# Patient Record
Sex: Female | Born: 1935
Health system: Southern US, Community
[De-identification: ages and names within clinical notes are randomized; demographics above are authoritative.]

## PROBLEM LIST (undated history)

## (undated) ENCOUNTER — Emergency Department: Admission: EM | Payer: Medicare Other | Source: Home / Self Care

## (undated) DIAGNOSIS — E2839 Other primary ovarian failure: Secondary | ICD-10-CM

## (undated) DIAGNOSIS — D649 Anemia, unspecified: Secondary | ICD-10-CM

## (undated) DIAGNOSIS — I10 Essential (primary) hypertension: Secondary | ICD-10-CM

## (undated) DIAGNOSIS — R55 Syncope and collapse: Secondary | ICD-10-CM

## (undated) DIAGNOSIS — I83893 Varicose veins of bilateral lower extremities with other complications: Secondary | ICD-10-CM

## (undated) DIAGNOSIS — E538 Deficiency of other specified B group vitamins: Secondary | ICD-10-CM

## (undated) DIAGNOSIS — M545 Low back pain, unspecified: Secondary | ICD-10-CM

## (undated) DIAGNOSIS — I219 Acute myocardial infarction, unspecified: Secondary | ICD-10-CM

## (undated) DIAGNOSIS — T4145XA Adverse effect of unspecified anesthetic, initial encounter: Secondary | ICD-10-CM

## (undated) DIAGNOSIS — M1990 Unspecified osteoarthritis, unspecified site: Secondary | ICD-10-CM

## (undated) DIAGNOSIS — E119 Type 2 diabetes mellitus without complications: Secondary | ICD-10-CM

## (undated) DIAGNOSIS — J301 Allergic rhinitis due to pollen: Secondary | ICD-10-CM

## (undated) DIAGNOSIS — I519 Heart disease, unspecified: Secondary | ICD-10-CM

## (undated) DIAGNOSIS — R319 Hematuria, unspecified: Secondary | ICD-10-CM

## (undated) DIAGNOSIS — R519 Headache, unspecified: Secondary | ICD-10-CM

## (undated) DIAGNOSIS — D563 Thalassemia minor: Secondary | ICD-10-CM

## (undated) DIAGNOSIS — I808 Phlebitis and thrombophlebitis of other sites: Secondary | ICD-10-CM

## (undated) DIAGNOSIS — C9590 Leukemia, unspecified not having achieved remission: Secondary | ICD-10-CM

## (undated) DIAGNOSIS — D518 Other vitamin B12 deficiency anemias: Secondary | ICD-10-CM

## (undated) DIAGNOSIS — G4710 Hypersomnia, unspecified: Secondary | ICD-10-CM

## (undated) DIAGNOSIS — R262 Difficulty in walking, not elsewhere classified: Secondary | ICD-10-CM

## (undated) DIAGNOSIS — I2510 Atherosclerotic heart disease of native coronary artery without angina pectoris: Secondary | ICD-10-CM

## (undated) DIAGNOSIS — N390 Urinary tract infection, site not specified: Secondary | ICD-10-CM

## (undated) DIAGNOSIS — H539 Unspecified visual disturbance: Secondary | ICD-10-CM

## (undated) DIAGNOSIS — Z8489 Family history of other specified conditions: Secondary | ICD-10-CM

## (undated) DIAGNOSIS — J029 Acute pharyngitis, unspecified: Secondary | ICD-10-CM

## (undated) DIAGNOSIS — M810 Age-related osteoporosis without current pathological fracture: Secondary | ICD-10-CM

## (undated) DIAGNOSIS — G4700 Insomnia, unspecified: Secondary | ICD-10-CM

## (undated) DIAGNOSIS — F32A Depression, unspecified: Secondary | ICD-10-CM

## (undated) DIAGNOSIS — E039 Hypothyroidism, unspecified: Secondary | ICD-10-CM

## (undated) DIAGNOSIS — N189 Chronic kidney disease, unspecified: Secondary | ICD-10-CM

## (undated) DIAGNOSIS — R300 Dysuria: Secondary | ICD-10-CM

## (undated) DIAGNOSIS — L03119 Cellulitis of unspecified part of limb: Secondary | ICD-10-CM

## (undated) DIAGNOSIS — M79609 Pain in unspecified limb: Secondary | ICD-10-CM

## (undated) DIAGNOSIS — K5909 Other constipation: Secondary | ICD-10-CM

## (undated) DIAGNOSIS — N649 Disorder of breast, unspecified: Secondary | ICD-10-CM

## (undated) DIAGNOSIS — S0990XA Unspecified injury of head, initial encounter: Secondary | ICD-10-CM

## (undated) DIAGNOSIS — D125 Benign neoplasm of sigmoid colon: Secondary | ICD-10-CM

## (undated) DIAGNOSIS — T8859XA Other complications of anesthesia, initial encounter: Secondary | ICD-10-CM

## (undated) DIAGNOSIS — E876 Hypokalemia: Secondary | ICD-10-CM

## (undated) DIAGNOSIS — M542 Cervicalgia: Secondary | ICD-10-CM

## (undated) DIAGNOSIS — J219 Acute bronchiolitis, unspecified: Secondary | ICD-10-CM

## (undated) DIAGNOSIS — K219 Gastro-esophageal reflux disease without esophagitis: Secondary | ICD-10-CM

## (undated) DIAGNOSIS — J45909 Unspecified asthma, uncomplicated: Secondary | ICD-10-CM

## (undated) DIAGNOSIS — F329 Major depressive disorder, single episode, unspecified: Secondary | ICD-10-CM

## (undated) DIAGNOSIS — R42 Dizziness and giddiness: Secondary | ICD-10-CM

## (undated) DIAGNOSIS — H6690 Otitis media, unspecified, unspecified ear: Secondary | ICD-10-CM

## (undated) DIAGNOSIS — I639 Cerebral infarction, unspecified: Secondary | ICD-10-CM

## (undated) DIAGNOSIS — C9210 Chronic myeloid leukemia, BCR/ABL-positive, not having achieved remission: Secondary | ICD-10-CM

## (undated) DIAGNOSIS — F411 Generalized anxiety disorder: Secondary | ICD-10-CM

## (undated) DIAGNOSIS — G479 Sleep disorder, unspecified: Secondary | ICD-10-CM

## (undated) DIAGNOSIS — Z9289 Personal history of other medical treatment: Secondary | ICD-10-CM

## (undated) DIAGNOSIS — M069 Rheumatoid arthritis, unspecified: Secondary | ICD-10-CM

## (undated) DIAGNOSIS — D569 Thalassemia, unspecified: Secondary | ICD-10-CM

## (undated) DIAGNOSIS — R51 Headache: Secondary | ICD-10-CM

## (undated) DIAGNOSIS — I679 Cerebrovascular disease, unspecified: Secondary | ICD-10-CM

## (undated) DIAGNOSIS — I619 Nontraumatic intracerebral hemorrhage, unspecified: Secondary | ICD-10-CM

## (undated) DIAGNOSIS — E782 Mixed hyperlipidemia: Secondary | ICD-10-CM

## (undated) DIAGNOSIS — M47812 Spondylosis without myelopathy or radiculopathy, cervical region: Secondary | ICD-10-CM

## (undated) DIAGNOSIS — F410 Panic disorder [episodic paroxysmal anxiety] without agoraphobia: Secondary | ICD-10-CM

## (undated) DIAGNOSIS — F5102 Adjustment insomnia: Secondary | ICD-10-CM

## (undated) DIAGNOSIS — L02419 Cutaneous abscess of limb, unspecified: Secondary | ICD-10-CM

## (undated) DIAGNOSIS — I739 Peripheral vascular disease, unspecified: Secondary | ICD-10-CM

## (undated) DIAGNOSIS — G459 Transient cerebral ischemic attack, unspecified: Secondary | ICD-10-CM

## (undated) DIAGNOSIS — G8929 Other chronic pain: Secondary | ICD-10-CM

## (undated) DIAGNOSIS — E049 Nontoxic goiter, unspecified: Secondary | ICD-10-CM

## (undated) DIAGNOSIS — C91Z0 Other lymphoid leukemia not having achieved remission: Secondary | ICD-10-CM

## (undated) DIAGNOSIS — I6529 Occlusion and stenosis of unspecified carotid artery: Secondary | ICD-10-CM

## (undated) HISTORY — PX: CORONARY ANGIOPLASTY WITH STENT PLACEMENT: SHX49

## (undated) HISTORY — DX: Dizziness and giddiness: R42

## (undated) HISTORY — DX: Adjustment insomnia: F51.02

## (undated) HISTORY — DX: Unspecified injury of head, initial encounter: S09.90XA

## (undated) HISTORY — DX: Insomnia, unspecified: G47.00

## (undated) HISTORY — DX: Nontoxic goiter, unspecified: E04.9

## (undated) HISTORY — DX: Chronic kidney disease, unspecified: N18.9

## (undated) HISTORY — DX: Generalized anxiety disorder: F41.1

## (undated) HISTORY — DX: Pain in unspecified limb: M79.609

## (undated) HISTORY — DX: Thalassemia minor: D56.3

## (undated) HISTORY — DX: Cervicalgia: M54.2

## (undated) HISTORY — DX: Depression, unspecified: F32.A

## (undated) HISTORY — DX: Transient cerebral ischemic attack, unspecified: G45.9

## (undated) HISTORY — DX: Disorder of breast, unspecified: N64.9

## (undated) HISTORY — DX: Major depressive disorder, single episode, unspecified: F32.9

## (undated) HISTORY — DX: Other primary ovarian failure: E28.39

## (undated) HISTORY — DX: Cellulitis of unspecified part of limb: L02.419

## (undated) HISTORY — DX: Atherosclerotic heart disease of native coronary artery without angina pectoris: I25.10

## (undated) HISTORY — DX: Unspecified osteoarthritis, unspecified site: M19.90

## (undated) HISTORY — DX: Rheumatoid arthritis, unspecified: M06.9

## (undated) HISTORY — DX: Anemia, unspecified: D64.9

## (undated) HISTORY — DX: Dysuria: R30.0

## (undated) HISTORY — DX: Cerebral infarction, unspecified: I63.9

## (undated) HISTORY — DX: Hypokalemia: E87.6

## (undated) HISTORY — PX: CATARACT EXTRACTION W/ INTRAOCULAR LENS  IMPLANT, BILATERAL: SHX1307

## (undated) HISTORY — PX: BACK SURGERY: SHX140

## (undated) HISTORY — DX: Cellulitis of unspecified part of limb: L03.119

## (undated) HISTORY — PX: ROTATOR CUFF REPAIR: SHX139

## (undated) HISTORY — DX: Low back pain: M54.5

## (undated) HISTORY — DX: Otitis media, unspecified, unspecified ear: H66.90

## (undated) HISTORY — DX: Low back pain, unspecified: M54.50

## (undated) HISTORY — DX: Deficiency of other specified B group vitamins: E53.8

## (undated) HISTORY — DX: Sleep disorder, unspecified: G47.9

## (undated) HISTORY — DX: Heart disease, unspecified: I51.9

## (undated) HISTORY — DX: Essential (primary) hypertension: I10

## (undated) HISTORY — DX: Syncope and collapse: R55

## (undated) HISTORY — DX: Panic disorder (episodic paroxysmal anxiety): F41.0

## (undated) HISTORY — DX: Acute pharyngitis, unspecified: J02.9

## (undated) HISTORY — DX: Gastro-esophageal reflux disease without esophagitis: K21.9

## (undated) HISTORY — DX: Other vitamin B12 deficiency anemias: D51.8

## (undated) HISTORY — PX: TOTAL KNEE ARTHROPLASTY: SHX125

## (undated) HISTORY — DX: Mixed hyperlipidemia: E78.2

## (undated) HISTORY — DX: Other lymphoid leukemia not having achieved remission: C91.Z0

## (undated) HISTORY — DX: Other constipation: K59.09

## (undated) HISTORY — DX: Nontraumatic intracerebral hemorrhage, unspecified: I61.9

## (undated) HISTORY — DX: Spondylosis without myelopathy or radiculopathy, cervical region: M47.812

## (undated) HISTORY — DX: Hypersomnia, unspecified: G47.10

## (undated) HISTORY — PX: CAROTID STENT INSERTION: SHX5766

## (undated) HISTORY — PX: FRACTURE SURGERY: SHX138

## (undated) HISTORY — DX: Occlusion and stenosis of unspecified carotid artery: I65.29

## (undated) HISTORY — DX: Varicose veins of bilateral lower extremities with other complications: I83.893

## (undated) HISTORY — DX: Acute bronchiolitis, unspecified: J21.9

## (undated) HISTORY — DX: Difficulty in walking, not elsewhere classified: R26.2

## (undated) HISTORY — DX: Allergic rhinitis due to pollen: J30.1

## (undated) HISTORY — DX: Cerebrovascular disease, unspecified: I67.9

## (undated) HISTORY — DX: Hypothyroidism, unspecified: E03.9

## (undated) HISTORY — DX: Unspecified asthma, uncomplicated: J45.909

## (undated) HISTORY — PX: JOINT REPLACEMENT: SHX530

## (undated) HISTORY — DX: Urinary tract infection, site not specified: N39.0

## (undated) HISTORY — PX: CAROTID ENDARTERECTOMY: SUR193

## (undated) HISTORY — DX: Hematuria, unspecified: R31.9

## (undated) HISTORY — DX: Phlebitis and thrombophlebitis of other sites: I80.8

## (undated) HISTORY — DX: Unspecified visual disturbance: H53.9

## (undated) HISTORY — DX: Age-related osteoporosis without current pathological fracture: M81.0

## (undated) HISTORY — DX: Benign neoplasm of sigmoid colon: D12.5

## (undated) HISTORY — DX: Peripheral vascular disease, unspecified: I73.9

---

## 1976-08-24 HISTORY — PX: TOTAL ABDOMINAL HYSTERECTOMY: SHX209

## 1978-08-24 HISTORY — PX: CHOLECYSTECTOMY OPEN: SUR202

## 1996-08-24 DIAGNOSIS — K635 Polyp of colon: Secondary | ICD-10-CM

## 1996-08-24 HISTORY — DX: Polyp of colon: K63.5

## 1999-12-29 ENCOUNTER — Ambulatory Visit (HOSPITAL_COMMUNITY): Admission: RE | Admit: 1999-12-29 | Discharge: 1999-12-29 | Payer: Self-pay | Admitting: Gastroenterology

## 2000-06-15 ENCOUNTER — Ambulatory Visit (HOSPITAL_COMMUNITY): Admission: RE | Admit: 2000-06-15 | Discharge: 2000-06-15 | Payer: Self-pay | Admitting: Gastroenterology

## 2001-12-22 DIAGNOSIS — C91Z0 Other lymphoid leukemia not having achieved remission: Secondary | ICD-10-CM

## 2001-12-22 HISTORY — DX: Other lymphoid leukemia not having achieved remission: C91.Z0

## 2003-02-16 ENCOUNTER — Encounter: Admission: RE | Admit: 2003-02-16 | Discharge: 2003-02-16 | Payer: Self-pay | Admitting: *Deleted

## 2003-02-16 ENCOUNTER — Encounter: Payer: Self-pay | Admitting: *Deleted

## 2003-03-07 ENCOUNTER — Encounter: Payer: Self-pay | Admitting: Gastroenterology

## 2003-03-07 ENCOUNTER — Ambulatory Visit (HOSPITAL_COMMUNITY): Admission: RE | Admit: 2003-03-07 | Discharge: 2003-03-07 | Payer: Self-pay | Admitting: Gastroenterology

## 2003-04-20 ENCOUNTER — Ambulatory Visit (HOSPITAL_COMMUNITY): Admission: RE | Admit: 2003-04-20 | Discharge: 2003-04-20 | Payer: Self-pay | Admitting: Gastroenterology

## 2003-08-25 HISTORY — PX: OTHER SURGICAL HISTORY: SHX169

## 2004-07-29 ENCOUNTER — Ambulatory Visit: Payer: Self-pay | Admitting: Internal Medicine

## 2005-06-22 ENCOUNTER — Ambulatory Visit (HOSPITAL_COMMUNITY): Admission: RE | Admit: 2005-06-22 | Discharge: 2005-06-22 | Payer: Self-pay | Admitting: Gastroenterology

## 2006-04-12 ENCOUNTER — Inpatient Hospital Stay: Payer: Self-pay | Admitting: Internal Medicine

## 2006-04-27 ENCOUNTER — Ambulatory Visit: Payer: Self-pay | Admitting: Internal Medicine

## 2006-05-21 ENCOUNTER — Ambulatory Visit: Payer: Self-pay | Admitting: Internal Medicine

## 2006-05-21 ENCOUNTER — Ambulatory Visit: Payer: Self-pay

## 2006-05-24 ENCOUNTER — Encounter: Payer: Self-pay | Admitting: Internal Medicine

## 2006-05-28 ENCOUNTER — Ambulatory Visit: Payer: Self-pay | Admitting: Internal Medicine

## 2006-06-30 ENCOUNTER — Encounter: Payer: Self-pay | Admitting: Internal Medicine

## 2006-07-10 ENCOUNTER — Emergency Department: Payer: Self-pay

## 2006-07-24 ENCOUNTER — Encounter: Payer: Self-pay | Admitting: Internal Medicine

## 2007-02-24 ENCOUNTER — Ambulatory Visit: Payer: Self-pay | Admitting: Internal Medicine

## 2007-03-15 ENCOUNTER — Ambulatory Visit: Payer: Self-pay | Admitting: Internal Medicine

## 2007-03-29 ENCOUNTER — Encounter: Payer: Self-pay | Admitting: Orthopaedic Surgery

## 2007-04-05 ENCOUNTER — Ambulatory Visit: Payer: Self-pay | Admitting: Vascular Surgery

## 2007-04-13 ENCOUNTER — Ambulatory Visit: Payer: Self-pay | Admitting: Family Medicine

## 2007-04-25 ENCOUNTER — Encounter: Payer: Self-pay | Admitting: Orthopaedic Surgery

## 2007-05-25 ENCOUNTER — Encounter: Payer: Self-pay | Admitting: Orthopaedic Surgery

## 2007-06-02 ENCOUNTER — Ambulatory Visit: Payer: Self-pay | Admitting: Unknown Physician Specialty

## 2007-06-25 ENCOUNTER — Encounter: Payer: Self-pay | Admitting: Orthopaedic Surgery

## 2007-07-11 ENCOUNTER — Ambulatory Visit: Payer: Self-pay | Admitting: Unknown Physician Specialty

## 2007-07-25 ENCOUNTER — Encounter: Payer: Self-pay | Admitting: Orthopaedic Surgery

## 2007-08-25 ENCOUNTER — Encounter: Payer: Self-pay | Admitting: Orthopaedic Surgery

## 2007-08-25 DIAGNOSIS — I2510 Atherosclerotic heart disease of native coronary artery without angina pectoris: Secondary | ICD-10-CM

## 2007-08-25 HISTORY — DX: Atherosclerotic heart disease of native coronary artery without angina pectoris: I25.10

## 2007-11-09 ENCOUNTER — Inpatient Hospital Stay: Payer: Self-pay | Admitting: Internal Medicine

## 2007-11-09 ENCOUNTER — Other Ambulatory Visit: Payer: Self-pay

## 2008-01-13 ENCOUNTER — Ambulatory Visit: Payer: Self-pay | Admitting: Internal Medicine

## 2008-02-13 ENCOUNTER — Encounter: Payer: Self-pay | Admitting: Internal Medicine

## 2008-04-23 ENCOUNTER — Encounter: Payer: Self-pay | Admitting: *Deleted

## 2008-04-25 ENCOUNTER — Encounter: Payer: Self-pay | Admitting: *Deleted

## 2008-05-01 ENCOUNTER — Emergency Department: Payer: Self-pay | Admitting: Emergency Medicine

## 2008-05-01 ENCOUNTER — Other Ambulatory Visit: Payer: Self-pay

## 2008-05-04 ENCOUNTER — Other Ambulatory Visit: Payer: Self-pay

## 2008-05-04 ENCOUNTER — Inpatient Hospital Stay: Payer: Self-pay | Admitting: Psychiatry

## 2008-05-05 ENCOUNTER — Other Ambulatory Visit: Payer: Self-pay

## 2008-05-28 ENCOUNTER — Encounter: Payer: Self-pay | Admitting: *Deleted

## 2008-06-24 ENCOUNTER — Encounter: Payer: Self-pay | Admitting: *Deleted

## 2008-07-30 ENCOUNTER — Encounter: Payer: Self-pay | Admitting: *Deleted

## 2008-08-06 ENCOUNTER — Inpatient Hospital Stay: Payer: Self-pay | Admitting: Internal Medicine

## 2008-08-24 HISTORY — PX: COLONOSCOPY: SHX174

## 2008-10-01 ENCOUNTER — Ambulatory Visit: Payer: Self-pay | Admitting: Internal Medicine

## 2008-10-03 ENCOUNTER — Encounter: Payer: Self-pay | Admitting: Internal Medicine

## 2008-10-22 ENCOUNTER — Encounter: Payer: Self-pay | Admitting: Internal Medicine

## 2008-11-16 ENCOUNTER — Ambulatory Visit: Payer: Self-pay | Admitting: Internal Medicine

## 2008-11-22 ENCOUNTER — Encounter: Payer: Self-pay | Admitting: Internal Medicine

## 2009-01-30 ENCOUNTER — Ambulatory Visit: Payer: Self-pay

## 2009-06-14 HISTORY — PX: PERCUTANEOUS PLACEMENT INTRAVASCULAR STENT CERVICAL CAROTID ARTERY: SUR1019

## 2009-07-30 ENCOUNTER — Inpatient Hospital Stay: Payer: Self-pay | Admitting: Internal Medicine

## 2009-08-05 ENCOUNTER — Ambulatory Visit: Payer: Self-pay | Admitting: Vascular Surgery

## 2009-08-14 ENCOUNTER — Inpatient Hospital Stay: Payer: Self-pay | Admitting: Vascular Surgery

## 2009-09-02 ENCOUNTER — Ambulatory Visit: Payer: Self-pay | Admitting: General Practice

## 2009-09-06 ENCOUNTER — Ambulatory Visit: Payer: Self-pay | Admitting: Vascular Surgery

## 2009-11-05 ENCOUNTER — Ambulatory Visit: Payer: Self-pay | Admitting: General Practice

## 2009-11-18 ENCOUNTER — Inpatient Hospital Stay: Payer: Self-pay | Admitting: General Practice

## 2009-11-24 ENCOUNTER — Encounter: Payer: Self-pay | Admitting: Internal Medicine

## 2009-12-13 ENCOUNTER — Emergency Department: Payer: Self-pay | Admitting: Unknown Physician Specialty

## 2009-12-17 ENCOUNTER — Encounter: Payer: Self-pay | Admitting: General Practice

## 2009-12-22 ENCOUNTER — Encounter: Payer: Self-pay | Admitting: General Practice

## 2010-01-10 ENCOUNTER — Emergency Department: Payer: Self-pay

## 2010-01-22 ENCOUNTER — Encounter: Payer: Self-pay | Admitting: General Practice

## 2010-02-21 ENCOUNTER — Encounter: Payer: Self-pay | Admitting: General Practice

## 2010-11-25 ENCOUNTER — Ambulatory Visit: Payer: Self-pay | Admitting: Internal Medicine

## 2011-07-10 ENCOUNTER — Ambulatory Visit: Payer: Self-pay | Admitting: Internal Medicine

## 2011-07-15 ENCOUNTER — Ambulatory Visit: Payer: Self-pay | Admitting: Internal Medicine

## 2011-07-22 ENCOUNTER — Ambulatory Visit: Payer: Self-pay

## 2011-08-19 ENCOUNTER — Ambulatory Visit: Payer: Self-pay | Admitting: Physician Assistant

## 2011-08-26 DIAGNOSIS — M545 Low back pain, unspecified: Secondary | ICD-10-CM | POA: Diagnosis not present

## 2011-08-26 DIAGNOSIS — M1990 Unspecified osteoarthritis, unspecified site: Secondary | ICD-10-CM | POA: Diagnosis not present

## 2011-08-26 DIAGNOSIS — M25559 Pain in unspecified hip: Secondary | ICD-10-CM | POA: Diagnosis not present

## 2011-08-26 DIAGNOSIS — M76899 Other specified enthesopathies of unspecified lower limb, excluding foot: Secondary | ICD-10-CM | POA: Diagnosis not present

## 2011-09-02 ENCOUNTER — Encounter: Payer: Self-pay | Admitting: Physician Assistant

## 2011-09-02 DIAGNOSIS — M79609 Pain in unspecified limb: Secondary | ICD-10-CM | POA: Diagnosis not present

## 2011-09-02 DIAGNOSIS — M6281 Muscle weakness (generalized): Secondary | ICD-10-CM | POA: Diagnosis not present

## 2011-09-02 DIAGNOSIS — R262 Difficulty in walking, not elsewhere classified: Secondary | ICD-10-CM | POA: Diagnosis not present

## 2011-09-02 DIAGNOSIS — IMO0001 Reserved for inherently not codable concepts without codable children: Secondary | ICD-10-CM | POA: Diagnosis not present

## 2011-09-03 DIAGNOSIS — D630 Anemia in neoplastic disease: Secondary | ICD-10-CM | POA: Diagnosis not present

## 2011-09-03 DIAGNOSIS — D649 Anemia, unspecified: Secondary | ICD-10-CM | POA: Diagnosis not present

## 2011-09-03 DIAGNOSIS — C9110 Chronic lymphocytic leukemia of B-cell type not having achieved remission: Secondary | ICD-10-CM | POA: Diagnosis not present

## 2011-09-03 DIAGNOSIS — C9111 Chronic lymphocytic leukemia of B-cell type in remission: Secondary | ICD-10-CM | POA: Diagnosis not present

## 2011-09-17 DIAGNOSIS — D630 Anemia in neoplastic disease: Secondary | ICD-10-CM | POA: Diagnosis not present

## 2011-09-17 DIAGNOSIS — C9110 Chronic lymphocytic leukemia of B-cell type not having achieved remission: Secondary | ICD-10-CM | POA: Diagnosis not present

## 2011-09-25 ENCOUNTER — Encounter: Payer: Self-pay | Admitting: Physician Assistant

## 2011-10-08 DIAGNOSIS — D630 Anemia in neoplastic disease: Secondary | ICD-10-CM | POA: Diagnosis not present

## 2011-10-08 DIAGNOSIS — C9110 Chronic lymphocytic leukemia of B-cell type not having achieved remission: Secondary | ICD-10-CM | POA: Diagnosis not present

## 2011-10-08 DIAGNOSIS — C9111 Chronic lymphocytic leukemia of B-cell type in remission: Secondary | ICD-10-CM | POA: Diagnosis not present

## 2011-10-08 DIAGNOSIS — D649 Anemia, unspecified: Secondary | ICD-10-CM | POA: Diagnosis not present

## 2011-10-22 DIAGNOSIS — C9110 Chronic lymphocytic leukemia of B-cell type not having achieved remission: Secondary | ICD-10-CM | POA: Diagnosis not present

## 2011-10-22 DIAGNOSIS — D630 Anemia in neoplastic disease: Secondary | ICD-10-CM | POA: Diagnosis not present

## 2011-11-05 DIAGNOSIS — D630 Anemia in neoplastic disease: Secondary | ICD-10-CM | POA: Diagnosis not present

## 2011-11-05 DIAGNOSIS — D649 Anemia, unspecified: Secondary | ICD-10-CM | POA: Diagnosis not present

## 2011-11-05 DIAGNOSIS — C9110 Chronic lymphocytic leukemia of B-cell type not having achieved remission: Secondary | ICD-10-CM | POA: Diagnosis not present

## 2011-11-19 DIAGNOSIS — D630 Anemia in neoplastic disease: Secondary | ICD-10-CM | POA: Diagnosis not present

## 2011-11-19 DIAGNOSIS — C9110 Chronic lymphocytic leukemia of B-cell type not having achieved remission: Secondary | ICD-10-CM | POA: Diagnosis not present

## 2011-12-03 DIAGNOSIS — D630 Anemia in neoplastic disease: Secondary | ICD-10-CM | POA: Diagnosis not present

## 2011-12-03 DIAGNOSIS — C9110 Chronic lymphocytic leukemia of B-cell type not having achieved remission: Secondary | ICD-10-CM | POA: Diagnosis not present

## 2011-12-03 DIAGNOSIS — D649 Anemia, unspecified: Secondary | ICD-10-CM | POA: Diagnosis not present

## 2011-12-17 DIAGNOSIS — C9110 Chronic lymphocytic leukemia of B-cell type not having achieved remission: Secondary | ICD-10-CM | POA: Diagnosis not present

## 2011-12-17 DIAGNOSIS — D630 Anemia in neoplastic disease: Secondary | ICD-10-CM | POA: Diagnosis not present

## 2011-12-24 DIAGNOSIS — E119 Type 2 diabetes mellitus without complications: Secondary | ICD-10-CM | POA: Diagnosis not present

## 2011-12-24 DIAGNOSIS — E782 Mixed hyperlipidemia: Secondary | ICD-10-CM | POA: Diagnosis not present

## 2011-12-24 DIAGNOSIS — M79609 Pain in unspecified limb: Secondary | ICD-10-CM | POA: Diagnosis not present

## 2011-12-24 DIAGNOSIS — E039 Hypothyroidism, unspecified: Secondary | ICD-10-CM | POA: Diagnosis not present

## 2011-12-24 DIAGNOSIS — I10 Essential (primary) hypertension: Secondary | ICD-10-CM | POA: Diagnosis not present

## 2011-12-24 DIAGNOSIS — M159 Polyosteoarthritis, unspecified: Secondary | ICD-10-CM | POA: Diagnosis not present

## 2011-12-24 DIAGNOSIS — I739 Peripheral vascular disease, unspecified: Secondary | ICD-10-CM | POA: Diagnosis not present

## 2011-12-31 DIAGNOSIS — M1990 Unspecified osteoarthritis, unspecified site: Secondary | ICD-10-CM | POA: Diagnosis not present

## 2011-12-31 DIAGNOSIS — Z1231 Encounter for screening mammogram for malignant neoplasm of breast: Secondary | ICD-10-CM | POA: Diagnosis not present

## 2011-12-31 DIAGNOSIS — Z79899 Other long term (current) drug therapy: Secondary | ICD-10-CM | POA: Diagnosis not present

## 2011-12-31 DIAGNOSIS — Z856 Personal history of leukemia: Secondary | ICD-10-CM | POA: Diagnosis not present

## 2011-12-31 DIAGNOSIS — M810 Age-related osteoporosis without current pathological fracture: Secondary | ICD-10-CM | POA: Diagnosis not present

## 2011-12-31 DIAGNOSIS — D599 Acquired hemolytic anemia, unspecified: Secondary | ICD-10-CM | POA: Diagnosis not present

## 2011-12-31 DIAGNOSIS — I10 Essential (primary) hypertension: Secondary | ICD-10-CM | POA: Diagnosis not present

## 2011-12-31 DIAGNOSIS — E039 Hypothyroidism, unspecified: Secondary | ICD-10-CM | POA: Diagnosis not present

## 2012-01-14 DIAGNOSIS — D630 Anemia in neoplastic disease: Secondary | ICD-10-CM | POA: Diagnosis not present

## 2012-01-14 DIAGNOSIS — C9110 Chronic lymphocytic leukemia of B-cell type not having achieved remission: Secondary | ICD-10-CM | POA: Diagnosis not present

## 2012-01-28 DIAGNOSIS — D649 Anemia, unspecified: Secondary | ICD-10-CM | POA: Diagnosis not present

## 2012-01-28 DIAGNOSIS — D630 Anemia in neoplastic disease: Secondary | ICD-10-CM | POA: Diagnosis not present

## 2012-01-28 DIAGNOSIS — C9110 Chronic lymphocytic leukemia of B-cell type not having achieved remission: Secondary | ICD-10-CM | POA: Diagnosis not present

## 2012-02-11 DIAGNOSIS — I059 Rheumatic mitral valve disease, unspecified: Secondary | ICD-10-CM | POA: Diagnosis not present

## 2012-02-11 DIAGNOSIS — I2510 Atherosclerotic heart disease of native coronary artery without angina pectoris: Secondary | ICD-10-CM | POA: Diagnosis not present

## 2012-02-11 DIAGNOSIS — I6529 Occlusion and stenosis of unspecified carotid artery: Secondary | ICD-10-CM | POA: Diagnosis not present

## 2012-02-11 DIAGNOSIS — I119 Hypertensive heart disease without heart failure: Secondary | ICD-10-CM | POA: Diagnosis not present

## 2012-02-12 DIAGNOSIS — D630 Anemia in neoplastic disease: Secondary | ICD-10-CM | POA: Diagnosis not present

## 2012-02-12 DIAGNOSIS — C9110 Chronic lymphocytic leukemia of B-cell type not having achieved remission: Secondary | ICD-10-CM | POA: Diagnosis not present

## 2012-02-24 DIAGNOSIS — D631 Anemia in chronic kidney disease: Secondary | ICD-10-CM | POA: Diagnosis not present

## 2012-02-24 DIAGNOSIS — N039 Chronic nephritic syndrome with unspecified morphologic changes: Secondary | ICD-10-CM | POA: Diagnosis not present

## 2012-02-24 DIAGNOSIS — N189 Chronic kidney disease, unspecified: Secondary | ICD-10-CM | POA: Diagnosis not present

## 2012-03-10 DIAGNOSIS — D649 Anemia, unspecified: Secondary | ICD-10-CM | POA: Diagnosis not present

## 2012-03-10 DIAGNOSIS — C9111 Chronic lymphocytic leukemia of B-cell type in remission: Secondary | ICD-10-CM | POA: Diagnosis not present

## 2012-03-24 DIAGNOSIS — N189 Chronic kidney disease, unspecified: Secondary | ICD-10-CM | POA: Diagnosis not present

## 2012-03-24 DIAGNOSIS — D631 Anemia in chronic kidney disease: Secondary | ICD-10-CM | POA: Diagnosis not present

## 2012-04-07 DIAGNOSIS — D631 Anemia in chronic kidney disease: Secondary | ICD-10-CM | POA: Diagnosis not present

## 2012-04-07 DIAGNOSIS — N189 Chronic kidney disease, unspecified: Secondary | ICD-10-CM | POA: Diagnosis not present

## 2012-04-22 DIAGNOSIS — D649 Anemia, unspecified: Secondary | ICD-10-CM | POA: Diagnosis not present

## 2012-04-22 DIAGNOSIS — C9111 Chronic lymphocytic leukemia of B-cell type in remission: Secondary | ICD-10-CM | POA: Diagnosis not present

## 2012-05-06 DIAGNOSIS — H01009 Unspecified blepharitis unspecified eye, unspecified eyelid: Secondary | ICD-10-CM | POA: Diagnosis not present

## 2012-05-06 DIAGNOSIS — H35369 Drusen (degenerative) of macula, unspecified eye: Secondary | ICD-10-CM | POA: Insufficient documentation

## 2012-05-06 DIAGNOSIS — D589 Hereditary hemolytic anemia, unspecified: Secondary | ICD-10-CM | POA: Diagnosis not present

## 2012-05-20 DIAGNOSIS — C9111 Chronic lymphocytic leukemia of B-cell type in remission: Secondary | ICD-10-CM | POA: Diagnosis not present

## 2012-05-20 DIAGNOSIS — H547 Unspecified visual loss: Secondary | ICD-10-CM | POA: Insufficient documentation

## 2012-05-20 DIAGNOSIS — H01009 Unspecified blepharitis unspecified eye, unspecified eyelid: Secondary | ICD-10-CM | POA: Diagnosis not present

## 2012-05-20 DIAGNOSIS — D649 Anemia, unspecified: Secondary | ICD-10-CM | POA: Diagnosis not present

## 2012-05-20 DIAGNOSIS — H35369 Drusen (degenerative) of macula, unspecified eye: Secondary | ICD-10-CM | POA: Diagnosis not present

## 2012-05-30 DIAGNOSIS — M159 Polyosteoarthritis, unspecified: Secondary | ICD-10-CM | POA: Diagnosis not present

## 2012-05-30 DIAGNOSIS — D649 Anemia, unspecified: Secondary | ICD-10-CM | POA: Diagnosis not present

## 2012-05-30 DIAGNOSIS — E039 Hypothyroidism, unspecified: Secondary | ICD-10-CM | POA: Diagnosis not present

## 2012-05-30 DIAGNOSIS — E119 Type 2 diabetes mellitus without complications: Secondary | ICD-10-CM | POA: Diagnosis not present

## 2012-05-30 DIAGNOSIS — I10 Essential (primary) hypertension: Secondary | ICD-10-CM | POA: Diagnosis not present

## 2012-05-30 DIAGNOSIS — E782 Mixed hyperlipidemia: Secondary | ICD-10-CM | POA: Diagnosis not present

## 2012-06-10 DIAGNOSIS — H269 Unspecified cataract: Secondary | ICD-10-CM | POA: Diagnosis not present

## 2012-06-10 DIAGNOSIS — H547 Unspecified visual loss: Secondary | ICD-10-CM | POA: Diagnosis not present

## 2012-06-10 DIAGNOSIS — H26499 Other secondary cataract, unspecified eye: Secondary | ICD-10-CM | POA: Diagnosis not present

## 2012-06-15 DIAGNOSIS — C9190 Lymphoid leukemia, unspecified not having achieved remission: Secondary | ICD-10-CM | POA: Insufficient documentation

## 2012-06-15 DIAGNOSIS — C9110 Chronic lymphocytic leukemia of B-cell type not having achieved remission: Secondary | ICD-10-CM | POA: Insufficient documentation

## 2012-06-15 DIAGNOSIS — D631 Anemia in chronic kidney disease: Secondary | ICD-10-CM | POA: Insufficient documentation

## 2012-06-16 DIAGNOSIS — D631 Anemia in chronic kidney disease: Secondary | ICD-10-CM | POA: Diagnosis not present

## 2012-06-16 DIAGNOSIS — C91Z0 Other lymphoid leukemia not having achieved remission: Secondary | ICD-10-CM | POA: Diagnosis not present

## 2012-06-16 DIAGNOSIS — Z23 Encounter for immunization: Secondary | ICD-10-CM | POA: Diagnosis not present

## 2012-06-16 DIAGNOSIS — N189 Chronic kidney disease, unspecified: Secondary | ICD-10-CM | POA: Diagnosis not present

## 2012-06-23 DIAGNOSIS — R5381 Other malaise: Secondary | ICD-10-CM | POA: Diagnosis not present

## 2012-06-23 DIAGNOSIS — N390 Urinary tract infection, site not specified: Secondary | ICD-10-CM | POA: Diagnosis not present

## 2012-06-23 DIAGNOSIS — R300 Dysuria: Secondary | ICD-10-CM | POA: Diagnosis not present

## 2012-06-23 DIAGNOSIS — R5383 Other fatigue: Secondary | ICD-10-CM | POA: Diagnosis not present

## 2012-06-23 DIAGNOSIS — E119 Type 2 diabetes mellitus without complications: Secondary | ICD-10-CM | POA: Diagnosis not present

## 2012-06-30 DIAGNOSIS — D631 Anemia in chronic kidney disease: Secondary | ICD-10-CM | POA: Diagnosis not present

## 2012-06-30 DIAGNOSIS — C91Z0 Other lymphoid leukemia not having achieved remission: Secondary | ICD-10-CM | POA: Diagnosis not present

## 2012-07-15 DIAGNOSIS — C91Z0 Other lymphoid leukemia not having achieved remission: Secondary | ICD-10-CM | POA: Diagnosis not present

## 2012-07-15 DIAGNOSIS — Z79899 Other long term (current) drug therapy: Secondary | ICD-10-CM | POA: Diagnosis not present

## 2012-07-15 DIAGNOSIS — N189 Chronic kidney disease, unspecified: Secondary | ICD-10-CM | POA: Diagnosis not present

## 2012-07-15 DIAGNOSIS — N039 Chronic nephritic syndrome with unspecified morphologic changes: Secondary | ICD-10-CM | POA: Diagnosis not present

## 2012-07-15 DIAGNOSIS — D631 Anemia in chronic kidney disease: Secondary | ICD-10-CM | POA: Diagnosis not present

## 2012-07-29 DIAGNOSIS — D631 Anemia in chronic kidney disease: Secondary | ICD-10-CM | POA: Diagnosis not present

## 2012-07-29 DIAGNOSIS — N189 Chronic kidney disease, unspecified: Secondary | ICD-10-CM | POA: Diagnosis not present

## 2012-08-11 DIAGNOSIS — D631 Anemia in chronic kidney disease: Secondary | ICD-10-CM | POA: Diagnosis not present

## 2012-08-11 DIAGNOSIS — N189 Chronic kidney disease, unspecified: Secondary | ICD-10-CM | POA: Diagnosis not present

## 2012-08-11 DIAGNOSIS — C91Z0 Other lymphoid leukemia not having achieved remission: Secondary | ICD-10-CM | POA: Diagnosis not present

## 2012-08-25 DIAGNOSIS — D631 Anemia in chronic kidney disease: Secondary | ICD-10-CM | POA: Diagnosis not present

## 2012-08-25 DIAGNOSIS — N189 Chronic kidney disease, unspecified: Secondary | ICD-10-CM | POA: Diagnosis not present

## 2012-09-09 DIAGNOSIS — D631 Anemia in chronic kidney disease: Secondary | ICD-10-CM | POA: Diagnosis not present

## 2012-09-09 DIAGNOSIS — N189 Chronic kidney disease, unspecified: Secondary | ICD-10-CM | POA: Diagnosis not present

## 2012-09-09 DIAGNOSIS — C91Z0 Other lymphoid leukemia not having achieved remission: Secondary | ICD-10-CM | POA: Diagnosis not present

## 2012-09-14 DIAGNOSIS — E785 Hyperlipidemia, unspecified: Secondary | ICD-10-CM | POA: Diagnosis not present

## 2012-09-14 DIAGNOSIS — I10 Essential (primary) hypertension: Secondary | ICD-10-CM | POA: Diagnosis not present

## 2012-09-14 DIAGNOSIS — I059 Rheumatic mitral valve disease, unspecified: Secondary | ICD-10-CM | POA: Diagnosis not present

## 2012-09-14 DIAGNOSIS — I2119 ST elevation (STEMI) myocardial infarction involving other coronary artery of inferior wall: Secondary | ICD-10-CM | POA: Diagnosis not present

## 2012-09-23 DIAGNOSIS — N189 Chronic kidney disease, unspecified: Secondary | ICD-10-CM | POA: Diagnosis not present

## 2012-09-23 DIAGNOSIS — D631 Anemia in chronic kidney disease: Secondary | ICD-10-CM | POA: Diagnosis not present

## 2012-10-03 DIAGNOSIS — E039 Hypothyroidism, unspecified: Secondary | ICD-10-CM | POA: Diagnosis not present

## 2012-10-03 DIAGNOSIS — N390 Urinary tract infection, site not specified: Secondary | ICD-10-CM | POA: Diagnosis not present

## 2012-10-03 DIAGNOSIS — R300 Dysuria: Secondary | ICD-10-CM | POA: Diagnosis not present

## 2012-10-03 DIAGNOSIS — E782 Mixed hyperlipidemia: Secondary | ICD-10-CM | POA: Diagnosis not present

## 2012-10-03 DIAGNOSIS — D649 Anemia, unspecified: Secondary | ICD-10-CM | POA: Diagnosis not present

## 2012-10-03 DIAGNOSIS — E119 Type 2 diabetes mellitus without complications: Secondary | ICD-10-CM | POA: Diagnosis not present

## 2012-10-11 DIAGNOSIS — D631 Anemia in chronic kidney disease: Secondary | ICD-10-CM | POA: Diagnosis not present

## 2012-10-11 DIAGNOSIS — N189 Chronic kidney disease, unspecified: Secondary | ICD-10-CM | POA: Diagnosis not present

## 2012-10-11 DIAGNOSIS — Z79899 Other long term (current) drug therapy: Secondary | ICD-10-CM | POA: Diagnosis not present

## 2012-10-20 DIAGNOSIS — N039 Chronic nephritic syndrome with unspecified morphologic changes: Secondary | ICD-10-CM | POA: Diagnosis not present

## 2012-10-20 DIAGNOSIS — D631 Anemia in chronic kidney disease: Secondary | ICD-10-CM | POA: Diagnosis not present

## 2012-10-20 DIAGNOSIS — C91Z0 Other lymphoid leukemia not having achieved remission: Secondary | ICD-10-CM | POA: Diagnosis not present

## 2012-10-20 DIAGNOSIS — Z79899 Other long term (current) drug therapy: Secondary | ICD-10-CM | POA: Diagnosis not present

## 2012-10-20 DIAGNOSIS — N189 Chronic kidney disease, unspecified: Secondary | ICD-10-CM | POA: Diagnosis not present

## 2012-10-20 DIAGNOSIS — I2510 Atherosclerotic heart disease of native coronary artery without angina pectoris: Secondary | ICD-10-CM | POA: Diagnosis not present

## 2012-10-20 DIAGNOSIS — Z5181 Encounter for therapeutic drug level monitoring: Secondary | ICD-10-CM | POA: Diagnosis not present

## 2012-10-20 DIAGNOSIS — Z8673 Personal history of transient ischemic attack (TIA), and cerebral infarction without residual deficits: Secondary | ICD-10-CM | POA: Diagnosis not present

## 2012-11-03 DIAGNOSIS — N189 Chronic kidney disease, unspecified: Secondary | ICD-10-CM | POA: Diagnosis not present

## 2012-11-03 DIAGNOSIS — D631 Anemia in chronic kidney disease: Secondary | ICD-10-CM | POA: Diagnosis not present

## 2012-11-03 DIAGNOSIS — C91Z0 Other lymphoid leukemia not having achieved remission: Secondary | ICD-10-CM | POA: Diagnosis not present

## 2012-11-17 DIAGNOSIS — I2510 Atherosclerotic heart disease of native coronary artery without angina pectoris: Secondary | ICD-10-CM | POA: Diagnosis not present

## 2012-11-17 DIAGNOSIS — D649 Anemia, unspecified: Secondary | ICD-10-CM | POA: Diagnosis not present

## 2012-11-17 DIAGNOSIS — Z006 Encounter for examination for normal comparison and control in clinical research program: Secondary | ICD-10-CM | POA: Diagnosis not present

## 2012-11-17 DIAGNOSIS — I129 Hypertensive chronic kidney disease with stage 1 through stage 4 chronic kidney disease, or unspecified chronic kidney disease: Secondary | ICD-10-CM | POA: Diagnosis not present

## 2012-11-17 DIAGNOSIS — D631 Anemia in chronic kidney disease: Secondary | ICD-10-CM | POA: Diagnosis not present

## 2012-11-17 DIAGNOSIS — C91Z0 Other lymphoid leukemia not having achieved remission: Secondary | ICD-10-CM | POA: Diagnosis not present

## 2012-11-17 DIAGNOSIS — N189 Chronic kidney disease, unspecified: Secondary | ICD-10-CM | POA: Diagnosis not present

## 2012-11-17 DIAGNOSIS — E119 Type 2 diabetes mellitus without complications: Secondary | ICD-10-CM | POA: Diagnosis not present

## 2012-11-17 DIAGNOSIS — Z79899 Other long term (current) drug therapy: Secondary | ICD-10-CM | POA: Diagnosis not present

## 2012-11-17 DIAGNOSIS — K219 Gastro-esophageal reflux disease without esophagitis: Secondary | ICD-10-CM | POA: Diagnosis not present

## 2012-11-17 DIAGNOSIS — I10 Essential (primary) hypertension: Secondary | ICD-10-CM | POA: Diagnosis not present

## 2012-11-17 DIAGNOSIS — Z7902 Long term (current) use of antithrombotics/antiplatelets: Secondary | ICD-10-CM | POA: Diagnosis not present

## 2012-11-17 DIAGNOSIS — E039 Hypothyroidism, unspecified: Secondary | ICD-10-CM | POA: Diagnosis not present

## 2012-11-25 DIAGNOSIS — H01009 Unspecified blepharitis unspecified eye, unspecified eyelid: Secondary | ICD-10-CM | POA: Diagnosis not present

## 2012-11-25 DIAGNOSIS — H35369 Drusen (degenerative) of macula, unspecified eye: Secondary | ICD-10-CM | POA: Diagnosis not present

## 2012-11-25 DIAGNOSIS — H5315 Visual distortions of shape and size: Secondary | ICD-10-CM | POA: Diagnosis not present

## 2012-11-25 DIAGNOSIS — Z79899 Other long term (current) drug therapy: Secondary | ICD-10-CM | POA: Diagnosis not present

## 2012-11-25 DIAGNOSIS — H547 Unspecified visual loss: Secondary | ICD-10-CM | POA: Diagnosis not present

## 2012-12-01 DIAGNOSIS — N039 Chronic nephritic syndrome with unspecified morphologic changes: Secondary | ICD-10-CM | POA: Diagnosis not present

## 2012-12-01 DIAGNOSIS — C91Z0 Other lymphoid leukemia not having achieved remission: Secondary | ICD-10-CM | POA: Diagnosis not present

## 2012-12-01 DIAGNOSIS — D631 Anemia in chronic kidney disease: Secondary | ICD-10-CM | POA: Diagnosis not present

## 2012-12-01 DIAGNOSIS — N189 Chronic kidney disease, unspecified: Secondary | ICD-10-CM | POA: Diagnosis not present

## 2012-12-13 DIAGNOSIS — E038 Other specified hypothyroidism: Secondary | ICD-10-CM | POA: Diagnosis not present

## 2012-12-13 DIAGNOSIS — E049 Nontoxic goiter, unspecified: Secondary | ICD-10-CM | POA: Diagnosis not present

## 2012-12-15 DIAGNOSIS — I10 Essential (primary) hypertension: Secondary | ICD-10-CM | POA: Diagnosis not present

## 2012-12-15 DIAGNOSIS — C91Z0 Other lymphoid leukemia not having achieved remission: Secondary | ICD-10-CM | POA: Diagnosis not present

## 2012-12-15 DIAGNOSIS — E782 Mixed hyperlipidemia: Secondary | ICD-10-CM | POA: Diagnosis not present

## 2012-12-15 DIAGNOSIS — K219 Gastro-esophageal reflux disease without esophagitis: Secondary | ICD-10-CM | POA: Diagnosis not present

## 2012-12-15 DIAGNOSIS — N189 Chronic kidney disease, unspecified: Secondary | ICD-10-CM | POA: Diagnosis not present

## 2012-12-15 DIAGNOSIS — D518 Other vitamin B12 deficiency anemias: Secondary | ICD-10-CM | POA: Diagnosis not present

## 2012-12-15 DIAGNOSIS — D631 Anemia in chronic kidney disease: Secondary | ICD-10-CM | POA: Diagnosis not present

## 2012-12-15 DIAGNOSIS — G2589 Other specified extrapyramidal and movement disorders: Secondary | ICD-10-CM | POA: Diagnosis not present

## 2012-12-15 DIAGNOSIS — E119 Type 2 diabetes mellitus without complications: Secondary | ICD-10-CM | POA: Diagnosis not present

## 2012-12-15 DIAGNOSIS — E038 Other specified hypothyroidism: Secondary | ICD-10-CM | POA: Diagnosis not present

## 2012-12-29 DIAGNOSIS — N189 Chronic kidney disease, unspecified: Secondary | ICD-10-CM | POA: Diagnosis not present

## 2012-12-29 DIAGNOSIS — C91Z0 Other lymphoid leukemia not having achieved remission: Secondary | ICD-10-CM | POA: Diagnosis not present

## 2012-12-29 DIAGNOSIS — N039 Chronic nephritic syndrome with unspecified morphologic changes: Secondary | ICD-10-CM | POA: Diagnosis not present

## 2012-12-29 DIAGNOSIS — D631 Anemia in chronic kidney disease: Secondary | ICD-10-CM | POA: Diagnosis not present

## 2013-01-12 DIAGNOSIS — N039 Chronic nephritic syndrome with unspecified morphologic changes: Secondary | ICD-10-CM | POA: Diagnosis not present

## 2013-01-12 DIAGNOSIS — D631 Anemia in chronic kidney disease: Secondary | ICD-10-CM | POA: Diagnosis not present

## 2013-01-12 DIAGNOSIS — Z5181 Encounter for therapeutic drug level monitoring: Secondary | ICD-10-CM | POA: Diagnosis not present

## 2013-01-12 DIAGNOSIS — N189 Chronic kidney disease, unspecified: Secondary | ICD-10-CM | POA: Diagnosis not present

## 2013-01-12 DIAGNOSIS — Z79899 Other long term (current) drug therapy: Secondary | ICD-10-CM | POA: Diagnosis not present

## 2013-01-12 DIAGNOSIS — C91Z0 Other lymphoid leukemia not having achieved remission: Secondary | ICD-10-CM | POA: Diagnosis not present

## 2013-01-27 DIAGNOSIS — D631 Anemia in chronic kidney disease: Secondary | ICD-10-CM | POA: Diagnosis not present

## 2013-01-27 DIAGNOSIS — N189 Chronic kidney disease, unspecified: Secondary | ICD-10-CM | POA: Diagnosis not present

## 2013-02-09 DIAGNOSIS — D631 Anemia in chronic kidney disease: Secondary | ICD-10-CM | POA: Diagnosis not present

## 2013-02-09 DIAGNOSIS — C91Z0 Other lymphoid leukemia not having achieved remission: Secondary | ICD-10-CM | POA: Diagnosis not present

## 2013-02-09 DIAGNOSIS — N189 Chronic kidney disease, unspecified: Secondary | ICD-10-CM | POA: Diagnosis not present

## 2013-02-09 DIAGNOSIS — D563 Thalassemia minor: Secondary | ICD-10-CM | POA: Diagnosis not present

## 2013-02-27 DIAGNOSIS — M545 Low back pain, unspecified: Secondary | ICD-10-CM | POA: Diagnosis not present

## 2013-02-27 DIAGNOSIS — M542 Cervicalgia: Secondary | ICD-10-CM | POA: Diagnosis not present

## 2013-02-27 DIAGNOSIS — R51 Headache: Secondary | ICD-10-CM | POA: Diagnosis not present

## 2013-02-27 DIAGNOSIS — I10 Essential (primary) hypertension: Secondary | ICD-10-CM | POA: Diagnosis not present

## 2013-03-01 ENCOUNTER — Ambulatory Visit: Payer: Self-pay | Admitting: Internal Medicine

## 2013-03-01 DIAGNOSIS — M542 Cervicalgia: Secondary | ICD-10-CM | POA: Diagnosis not present

## 2013-03-01 DIAGNOSIS — Z9181 History of falling: Secondary | ICD-10-CM | POA: Diagnosis not present

## 2013-03-01 DIAGNOSIS — R51 Headache: Secondary | ICD-10-CM | POA: Diagnosis not present

## 2013-03-09 DIAGNOSIS — M545 Low back pain, unspecified: Secondary | ICD-10-CM | POA: Diagnosis not present

## 2013-03-09 DIAGNOSIS — Z79899 Other long term (current) drug therapy: Secondary | ICD-10-CM | POA: Diagnosis not present

## 2013-03-09 DIAGNOSIS — E039 Hypothyroidism, unspecified: Secondary | ICD-10-CM | POA: Diagnosis not present

## 2013-03-09 DIAGNOSIS — E782 Mixed hyperlipidemia: Secondary | ICD-10-CM | POA: Diagnosis not present

## 2013-03-09 DIAGNOSIS — E119 Type 2 diabetes mellitus without complications: Secondary | ICD-10-CM | POA: Diagnosis not present

## 2013-03-09 DIAGNOSIS — D631 Anemia in chronic kidney disease: Secondary | ICD-10-CM | POA: Diagnosis not present

## 2013-03-09 DIAGNOSIS — C91Z0 Other lymphoid leukemia not having achieved remission: Secondary | ICD-10-CM | POA: Diagnosis not present

## 2013-03-09 DIAGNOSIS — M79609 Pain in unspecified limb: Secondary | ICD-10-CM | POA: Diagnosis not present

## 2013-03-09 DIAGNOSIS — M542 Cervicalgia: Secondary | ICD-10-CM | POA: Diagnosis not present

## 2013-03-09 DIAGNOSIS — N189 Chronic kidney disease, unspecified: Secondary | ICD-10-CM | POA: Diagnosis not present

## 2013-03-09 DIAGNOSIS — I129 Hypertensive chronic kidney disease with stage 1 through stage 4 chronic kidney disease, or unspecified chronic kidney disease: Secondary | ICD-10-CM | POA: Diagnosis not present

## 2013-03-13 DIAGNOSIS — R6889 Other general symptoms and signs: Secondary | ICD-10-CM | POA: Diagnosis not present

## 2013-03-14 ENCOUNTER — Emergency Department: Payer: Self-pay | Admitting: Emergency Medicine

## 2013-03-14 DIAGNOSIS — R52 Pain, unspecified: Secondary | ICD-10-CM | POA: Diagnosis not present

## 2013-03-14 DIAGNOSIS — R109 Unspecified abdominal pain: Secondary | ICD-10-CM | POA: Diagnosis not present

## 2013-03-14 DIAGNOSIS — E119 Type 2 diabetes mellitus without complications: Secondary | ICD-10-CM | POA: Diagnosis not present

## 2013-03-14 DIAGNOSIS — I10 Essential (primary) hypertension: Secondary | ICD-10-CM | POA: Diagnosis not present

## 2013-03-14 DIAGNOSIS — K5900 Constipation, unspecified: Secondary | ICD-10-CM | POA: Diagnosis not present

## 2013-03-14 DIAGNOSIS — Z79899 Other long term (current) drug therapy: Secondary | ICD-10-CM | POA: Diagnosis not present

## 2013-03-14 DIAGNOSIS — E039 Hypothyroidism, unspecified: Secondary | ICD-10-CM | POA: Diagnosis not present

## 2013-03-14 DIAGNOSIS — Z8673 Personal history of transient ischemic attack (TIA), and cerebral infarction without residual deficits: Secondary | ICD-10-CM | POA: Diagnosis not present

## 2013-03-14 DIAGNOSIS — K5289 Other specified noninfective gastroenteritis and colitis: Secondary | ICD-10-CM | POA: Diagnosis not present

## 2013-03-14 DIAGNOSIS — Z86718 Personal history of other venous thrombosis and embolism: Secondary | ICD-10-CM | POA: Diagnosis not present

## 2013-03-14 LAB — COMPREHENSIVE METABOLIC PANEL
Albumin: 3.6 g/dL (ref 3.4–5.0)
Alkaline Phosphatase: 70 U/L (ref 50–136)
Anion Gap: 6 — ABNORMAL LOW (ref 7–16)
BUN: 21 mg/dL — ABNORMAL HIGH (ref 7–18)
Bilirubin,Total: 1.9 mg/dL — ABNORMAL HIGH (ref 0.2–1.0)
Calcium, Total: 9.7 mg/dL (ref 8.5–10.1)
Chloride: 105 mmol/L (ref 98–107)
Co2: 27 mmol/L (ref 21–32)
Creatinine: 1.5 mg/dL — ABNORMAL HIGH (ref 0.60–1.30)
EGFR (African American): 38 — ABNORMAL LOW
EGFR (Non-African Amer.): 33 — ABNORMAL LOW
Glucose: 140 mg/dL — ABNORMAL HIGH (ref 65–99)
Osmolality: 281 (ref 275–301)
Potassium: 4.6 mmol/L (ref 3.5–5.1)
SGOT(AST): 34 U/L (ref 15–37)
SGPT (ALT): 24 U/L (ref 12–78)
Sodium: 138 mmol/L (ref 136–145)
Total Protein: 7.4 g/dL (ref 6.4–8.2)

## 2013-03-14 LAB — CBC
HCT: 34.6 % — ABNORMAL LOW (ref 35.0–47.0)
HGB: 10.8 g/dL — ABNORMAL LOW (ref 12.0–16.0)
MCH: 19.6 pg — ABNORMAL LOW (ref 26.0–34.0)
MCHC: 31.2 g/dL — ABNORMAL LOW (ref 32.0–36.0)
MCV: 63 fL — ABNORMAL LOW (ref 80–100)
Platelet: 166 10*3/uL (ref 150–440)
RBC: 5.51 10*6/uL — ABNORMAL HIGH (ref 3.80–5.20)
RDW: 17.4 % — ABNORMAL HIGH (ref 11.5–14.5)
WBC: 11.3 10*3/uL — ABNORMAL HIGH (ref 3.6–11.0)

## 2013-03-14 LAB — CLOSTRIDIUM DIFFICILE BY PCR

## 2013-03-16 LAB — STOOL CULTURE

## 2013-03-23 DIAGNOSIS — C91Z0 Other lymphoid leukemia not having achieved remission: Secondary | ICD-10-CM | POA: Diagnosis not present

## 2013-03-23 DIAGNOSIS — D631 Anemia in chronic kidney disease: Secondary | ICD-10-CM | POA: Diagnosis not present

## 2013-03-23 DIAGNOSIS — N189 Chronic kidney disease, unspecified: Secondary | ICD-10-CM | POA: Diagnosis not present

## 2013-03-30 DIAGNOSIS — F411 Generalized anxiety disorder: Secondary | ICD-10-CM | POA: Diagnosis not present

## 2013-03-30 DIAGNOSIS — M159 Polyosteoarthritis, unspecified: Secondary | ICD-10-CM | POA: Diagnosis not present

## 2013-03-30 DIAGNOSIS — M542 Cervicalgia: Secondary | ICD-10-CM | POA: Diagnosis not present

## 2013-03-30 DIAGNOSIS — I10 Essential (primary) hypertension: Secondary | ICD-10-CM | POA: Diagnosis not present

## 2013-03-30 DIAGNOSIS — W010XXA Fall on same level from slipping, tripping and stumbling without subsequent striking against object, initial encounter: Secondary | ICD-10-CM | POA: Diagnosis not present

## 2013-03-30 DIAGNOSIS — R5383 Other fatigue: Secondary | ICD-10-CM | POA: Diagnosis not present

## 2013-03-30 DIAGNOSIS — R5381 Other malaise: Secondary | ICD-10-CM | POA: Diagnosis not present

## 2013-03-30 DIAGNOSIS — E038 Other specified hypothyroidism: Secondary | ICD-10-CM | POA: Diagnosis not present

## 2013-04-05 DIAGNOSIS — D649 Anemia, unspecified: Secondary | ICD-10-CM | POA: Diagnosis not present

## 2013-04-06 ENCOUNTER — Ambulatory Visit: Payer: Self-pay | Admitting: Internal Medicine

## 2013-04-06 DIAGNOSIS — M546 Pain in thoracic spine: Secondary | ICD-10-CM | POA: Diagnosis not present

## 2013-04-06 DIAGNOSIS — M545 Low back pain, unspecified: Secondary | ICD-10-CM | POA: Diagnosis not present

## 2013-04-06 DIAGNOSIS — M542 Cervicalgia: Secondary | ICD-10-CM | POA: Diagnosis not present

## 2013-04-07 ENCOUNTER — Ambulatory Visit: Payer: Self-pay | Admitting: Internal Medicine

## 2013-04-07 DIAGNOSIS — M545 Low back pain, unspecified: Secondary | ICD-10-CM | POA: Diagnosis not present

## 2013-04-07 DIAGNOSIS — S22009A Unspecified fracture of unspecified thoracic vertebra, initial encounter for closed fracture: Secondary | ICD-10-CM | POA: Diagnosis not present

## 2013-04-13 DIAGNOSIS — M79609 Pain in unspecified limb: Secondary | ICD-10-CM | POA: Diagnosis not present

## 2013-04-13 DIAGNOSIS — M545 Low back pain, unspecified: Secondary | ICD-10-CM | POA: Diagnosis not present

## 2013-04-13 DIAGNOSIS — M542 Cervicalgia: Secondary | ICD-10-CM | POA: Diagnosis not present

## 2013-04-13 DIAGNOSIS — R262 Difficulty in walking, not elsewhere classified: Secondary | ICD-10-CM | POA: Diagnosis not present

## 2013-04-20 DIAGNOSIS — N189 Chronic kidney disease, unspecified: Secondary | ICD-10-CM | POA: Diagnosis not present

## 2013-04-20 DIAGNOSIS — D631 Anemia in chronic kidney disease: Secondary | ICD-10-CM | POA: Diagnosis not present

## 2013-04-27 DIAGNOSIS — S22080A Wedge compression fracture of T11-T12 vertebra, initial encounter for closed fracture: Secondary | ICD-10-CM | POA: Insufficient documentation

## 2013-04-27 DIAGNOSIS — M542 Cervicalgia: Secondary | ICD-10-CM | POA: Diagnosis not present

## 2013-04-27 DIAGNOSIS — S22009A Unspecified fracture of unspecified thoracic vertebra, initial encounter for closed fracture: Secondary | ICD-10-CM | POA: Diagnosis not present

## 2013-05-04 DIAGNOSIS — D631 Anemia in chronic kidney disease: Secondary | ICD-10-CM | POA: Diagnosis not present

## 2013-05-11 ENCOUNTER — Emergency Department: Payer: Self-pay | Admitting: Emergency Medicine

## 2013-05-11 DIAGNOSIS — S0003XA Contusion of scalp, initial encounter: Secondary | ICD-10-CM | POA: Diagnosis not present

## 2013-05-11 DIAGNOSIS — S22009A Unspecified fracture of unspecified thoracic vertebra, initial encounter for closed fracture: Secondary | ICD-10-CM | POA: Diagnosis not present

## 2013-05-11 DIAGNOSIS — S0993XA Unspecified injury of face, initial encounter: Secondary | ICD-10-CM | POA: Diagnosis not present

## 2013-05-11 DIAGNOSIS — I252 Old myocardial infarction: Secondary | ICD-10-CM | POA: Diagnosis not present

## 2013-05-11 DIAGNOSIS — Z888 Allergy status to other drugs, medicaments and biological substances status: Secondary | ICD-10-CM | POA: Diagnosis not present

## 2013-05-11 DIAGNOSIS — I10 Essential (primary) hypertension: Secondary | ICD-10-CM | POA: Diagnosis not present

## 2013-05-11 DIAGNOSIS — Z8673 Personal history of transient ischemic attack (TIA), and cerebral infarction without residual deficits: Secondary | ICD-10-CM | POA: Diagnosis not present

## 2013-05-11 DIAGNOSIS — Z79899 Other long term (current) drug therapy: Secondary | ICD-10-CM | POA: Diagnosis not present

## 2013-05-11 DIAGNOSIS — S0990XA Unspecified injury of head, initial encounter: Secondary | ICD-10-CM | POA: Diagnosis not present

## 2013-05-11 DIAGNOSIS — Z880 Allergy status to penicillin: Secondary | ICD-10-CM | POA: Diagnosis not present

## 2013-05-11 LAB — COMPREHENSIVE METABOLIC PANEL
Albumin: 3.8 g/dL (ref 3.4–5.0)
Alkaline Phosphatase: 55 U/L (ref 50–136)
Anion Gap: 6 — ABNORMAL LOW (ref 7–16)
BUN: 27 mg/dL — ABNORMAL HIGH (ref 7–18)
Bilirubin,Total: 1.4 mg/dL — ABNORMAL HIGH (ref 0.2–1.0)
Calcium, Total: 9.7 mg/dL (ref 8.5–10.1)
Chloride: 101 mmol/L (ref 98–107)
Co2: 28 mmol/L (ref 21–32)
Creatinine: 1.66 mg/dL — ABNORMAL HIGH (ref 0.60–1.30)
EGFR (African American): 33 — ABNORMAL LOW
EGFR (Non-African Amer.): 29 — ABNORMAL LOW
Glucose: 109 mg/dL — ABNORMAL HIGH (ref 65–99)
Osmolality: 276 (ref 275–301)
Potassium: 3.9 mmol/L (ref 3.5–5.1)
SGOT(AST): 33 U/L (ref 15–37)
SGPT (ALT): 35 U/L (ref 12–78)
Sodium: 135 mmol/L — ABNORMAL LOW (ref 136–145)
Total Protein: 7.3 g/dL (ref 6.4–8.2)

## 2013-05-11 LAB — URINALYSIS, COMPLETE
Bacteria: NONE SEEN
Bilirubin,UR: NEGATIVE
Blood: NEGATIVE
Glucose,UR: NEGATIVE mg/dL (ref 0–75)
Hyaline Cast: 11
Ketone: NEGATIVE
Nitrite: NEGATIVE
Ph: 6 (ref 4.5–8.0)
Protein: 25
RBC,UR: 3 /HPF (ref 0–5)
Specific Gravity: 1.03 (ref 1.003–1.030)
Squamous Epithelial: 2
WBC UR: 9 /HPF (ref 0–5)

## 2013-05-11 LAB — CBC
HCT: 34.1 % — ABNORMAL LOW (ref 35.0–47.0)
HGB: 10.7 g/dL — ABNORMAL LOW (ref 12.0–16.0)
MCH: 19.8 pg — ABNORMAL LOW (ref 26.0–34.0)
MCHC: 31.4 g/dL — ABNORMAL LOW (ref 32.0–36.0)
MCV: 63 fL — ABNORMAL LOW (ref 80–100)
Platelet: 134 10*3/uL — ABNORMAL LOW (ref 150–440)
RBC: 5.42 10*6/uL — ABNORMAL HIGH (ref 3.80–5.20)
RDW: 17.4 % — ABNORMAL HIGH (ref 11.5–14.5)
WBC: 7 10*3/uL (ref 3.6–11.0)

## 2013-05-11 LAB — TROPONIN I: Troponin-I: <0.02 ng/mL

## 2013-05-18 DIAGNOSIS — W19XXXA Unspecified fall, initial encounter: Secondary | ICD-10-CM | POA: Diagnosis not present

## 2013-05-18 DIAGNOSIS — Z043 Encounter for examination and observation following other accident: Secondary | ICD-10-CM | POA: Diagnosis not present

## 2013-05-18 DIAGNOSIS — W19XXXS Unspecified fall, sequela: Secondary | ICD-10-CM | POA: Diagnosis not present

## 2013-05-18 DIAGNOSIS — IMO0002 Reserved for concepts with insufficient information to code with codable children: Secondary | ICD-10-CM | POA: Diagnosis not present

## 2013-05-19 DIAGNOSIS — W19XXXA Unspecified fall, initial encounter: Secondary | ICD-10-CM | POA: Insufficient documentation

## 2013-05-26 DIAGNOSIS — H35369 Drusen (degenerative) of macula, unspecified eye: Secondary | ICD-10-CM | POA: Diagnosis not present

## 2013-05-26 DIAGNOSIS — H547 Unspecified visual loss: Secondary | ICD-10-CM | POA: Diagnosis not present

## 2013-05-26 DIAGNOSIS — H01009 Unspecified blepharitis unspecified eye, unspecified eyelid: Secondary | ICD-10-CM | POA: Diagnosis not present

## 2013-06-01 DIAGNOSIS — M542 Cervicalgia: Secondary | ICD-10-CM | POA: Diagnosis not present

## 2013-06-01 DIAGNOSIS — D631 Anemia in chronic kidney disease: Secondary | ICD-10-CM | POA: Diagnosis not present

## 2013-06-01 DIAGNOSIS — IMO0002 Reserved for concepts with insufficient information to code with codable children: Secondary | ICD-10-CM | POA: Diagnosis not present

## 2013-06-01 DIAGNOSIS — M545 Low back pain, unspecified: Secondary | ICD-10-CM | POA: Diagnosis not present

## 2013-06-01 DIAGNOSIS — N189 Chronic kidney disease, unspecified: Secondary | ICD-10-CM | POA: Diagnosis not present

## 2013-06-08 DIAGNOSIS — K5909 Other constipation: Secondary | ICD-10-CM | POA: Diagnosis not present

## 2013-06-08 DIAGNOSIS — D509 Iron deficiency anemia, unspecified: Secondary | ICD-10-CM | POA: Diagnosis not present

## 2013-06-08 DIAGNOSIS — R141 Gas pain: Secondary | ICD-10-CM | POA: Diagnosis not present

## 2013-06-08 DIAGNOSIS — K5730 Diverticulosis of large intestine without perforation or abscess without bleeding: Secondary | ICD-10-CM | POA: Diagnosis not present

## 2013-06-15 DIAGNOSIS — D631 Anemia in chronic kidney disease: Secondary | ICD-10-CM | POA: Diagnosis not present

## 2013-06-15 DIAGNOSIS — D696 Thrombocytopenia, unspecified: Secondary | ICD-10-CM | POA: Diagnosis not present

## 2013-06-15 DIAGNOSIS — D649 Anemia, unspecified: Secondary | ICD-10-CM | POA: Diagnosis not present

## 2013-06-15 DIAGNOSIS — Z5181 Encounter for therapeutic drug level monitoring: Secondary | ICD-10-CM | POA: Diagnosis not present

## 2013-06-15 DIAGNOSIS — Z79899 Other long term (current) drug therapy: Secondary | ICD-10-CM | POA: Diagnosis not present

## 2013-06-15 DIAGNOSIS — C91Z0 Other lymphoid leukemia not having achieved remission: Secondary | ICD-10-CM | POA: Diagnosis not present

## 2013-06-15 DIAGNOSIS — N189 Chronic kidney disease, unspecified: Secondary | ICD-10-CM | POA: Diagnosis not present

## 2013-06-15 DIAGNOSIS — Z23 Encounter for immunization: Secondary | ICD-10-CM | POA: Diagnosis not present

## 2013-06-15 DIAGNOSIS — I129 Hypertensive chronic kidney disease with stage 1 through stage 4 chronic kidney disease, or unspecified chronic kidney disease: Secondary | ICD-10-CM | POA: Diagnosis not present

## 2013-06-22 DIAGNOSIS — R42 Dizziness and giddiness: Secondary | ICD-10-CM | POA: Diagnosis not present

## 2013-06-22 DIAGNOSIS — E038 Other specified hypothyroidism: Secondary | ICD-10-CM | POA: Diagnosis not present

## 2013-06-22 DIAGNOSIS — I6789 Other cerebrovascular disease: Secondary | ICD-10-CM | POA: Diagnosis not present

## 2013-06-22 DIAGNOSIS — D649 Anemia, unspecified: Secondary | ICD-10-CM | POA: Diagnosis not present

## 2013-06-22 DIAGNOSIS — E119 Type 2 diabetes mellitus without complications: Secondary | ICD-10-CM | POA: Diagnosis not present

## 2013-06-22 DIAGNOSIS — I10 Essential (primary) hypertension: Secondary | ICD-10-CM | POA: Diagnosis not present

## 2013-06-22 DIAGNOSIS — H6690 Otitis media, unspecified, unspecified ear: Secondary | ICD-10-CM | POA: Diagnosis not present

## 2013-06-22 DIAGNOSIS — G4700 Insomnia, unspecified: Secondary | ICD-10-CM | POA: Diagnosis not present

## 2013-06-29 DIAGNOSIS — N189 Chronic kidney disease, unspecified: Secondary | ICD-10-CM | POA: Diagnosis not present

## 2013-06-29 DIAGNOSIS — D631 Anemia in chronic kidney disease: Secondary | ICD-10-CM | POA: Diagnosis not present

## 2013-06-29 DIAGNOSIS — C91Z0 Other lymphoid leukemia not having achieved remission: Secondary | ICD-10-CM | POA: Diagnosis not present

## 2013-07-03 DIAGNOSIS — I669 Occlusion and stenosis of unspecified cerebral artery: Secondary | ICD-10-CM | POA: Diagnosis not present

## 2013-07-13 DIAGNOSIS — R079 Chest pain, unspecified: Secondary | ICD-10-CM | POA: Diagnosis not present

## 2013-07-13 DIAGNOSIS — C91Z0 Other lymphoid leukemia not having achieved remission: Secondary | ICD-10-CM | POA: Diagnosis not present

## 2013-07-13 DIAGNOSIS — D649 Anemia, unspecified: Secondary | ICD-10-CM | POA: Diagnosis not present

## 2013-07-13 DIAGNOSIS — I6529 Occlusion and stenosis of unspecified carotid artery: Secondary | ICD-10-CM | POA: Diagnosis not present

## 2013-07-13 DIAGNOSIS — I4891 Unspecified atrial fibrillation: Secondary | ICD-10-CM | POA: Diagnosis not present

## 2013-07-13 DIAGNOSIS — Z79899 Other long term (current) drug therapy: Secondary | ICD-10-CM | POA: Diagnosis not present

## 2013-07-17 DIAGNOSIS — I10 Essential (primary) hypertension: Secondary | ICD-10-CM | POA: Diagnosis not present

## 2013-07-17 DIAGNOSIS — I2510 Atherosclerotic heart disease of native coronary artery without angina pectoris: Secondary | ICD-10-CM | POA: Diagnosis not present

## 2013-07-17 DIAGNOSIS — R262 Difficulty in walking, not elsewhere classified: Secondary | ICD-10-CM | POA: Diagnosis not present

## 2013-07-17 DIAGNOSIS — G2589 Other specified extrapyramidal and movement disorders: Secondary | ICD-10-CM | POA: Diagnosis not present

## 2013-07-17 DIAGNOSIS — I6529 Occlusion and stenosis of unspecified carotid artery: Secondary | ICD-10-CM | POA: Diagnosis not present

## 2013-07-31 DIAGNOSIS — F411 Generalized anxiety disorder: Secondary | ICD-10-CM | POA: Diagnosis not present

## 2013-07-31 DIAGNOSIS — I10 Essential (primary) hypertension: Secondary | ICD-10-CM | POA: Diagnosis not present

## 2013-07-31 DIAGNOSIS — M545 Low back pain, unspecified: Secondary | ICD-10-CM | POA: Diagnosis not present

## 2013-07-31 DIAGNOSIS — K5909 Other constipation: Secondary | ICD-10-CM | POA: Diagnosis not present

## 2013-07-31 DIAGNOSIS — R262 Difficulty in walking, not elsewhere classified: Secondary | ICD-10-CM | POA: Diagnosis not present

## 2013-07-31 DIAGNOSIS — I4891 Unspecified atrial fibrillation: Secondary | ICD-10-CM | POA: Diagnosis not present

## 2013-08-03 DIAGNOSIS — W19XXXS Unspecified fall, sequela: Secondary | ICD-10-CM | POA: Diagnosis not present

## 2013-08-03 DIAGNOSIS — IMO0002 Reserved for concepts with insufficient information to code with codable children: Secondary | ICD-10-CM | POA: Diagnosis not present

## 2013-08-03 DIAGNOSIS — M51379 Other intervertebral disc degeneration, lumbosacral region without mention of lumbar back pain or lower extremity pain: Secondary | ICD-10-CM | POA: Diagnosis not present

## 2013-08-03 DIAGNOSIS — M545 Low back pain, unspecified: Secondary | ICD-10-CM | POA: Diagnosis not present

## 2013-08-03 DIAGNOSIS — M5137 Other intervertebral disc degeneration, lumbosacral region: Secondary | ICD-10-CM | POA: Diagnosis not present

## 2013-08-11 DIAGNOSIS — C91Z0 Other lymphoid leukemia not having achieved remission: Secondary | ICD-10-CM | POA: Diagnosis not present

## 2013-08-11 DIAGNOSIS — D649 Anemia, unspecified: Secondary | ICD-10-CM | POA: Diagnosis not present

## 2013-08-25 DIAGNOSIS — N189 Chronic kidney disease, unspecified: Secondary | ICD-10-CM | POA: Diagnosis not present

## 2013-08-25 DIAGNOSIS — C91Z0 Other lymphoid leukemia not having achieved remission: Secondary | ICD-10-CM | POA: Diagnosis not present

## 2013-08-25 DIAGNOSIS — N039 Chronic nephritic syndrome with unspecified morphologic changes: Secondary | ICD-10-CM | POA: Diagnosis not present

## 2013-08-25 DIAGNOSIS — D631 Anemia in chronic kidney disease: Secondary | ICD-10-CM | POA: Diagnosis not present

## 2013-09-04 DIAGNOSIS — R269 Unspecified abnormalities of gait and mobility: Secondary | ICD-10-CM | POA: Diagnosis not present

## 2013-09-07 DIAGNOSIS — Z5181 Encounter for therapeutic drug level monitoring: Secondary | ICD-10-CM | POA: Diagnosis not present

## 2013-09-07 DIAGNOSIS — C91Z0 Other lymphoid leukemia not having achieved remission: Secondary | ICD-10-CM | POA: Diagnosis not present

## 2013-09-07 DIAGNOSIS — D631 Anemia in chronic kidney disease: Secondary | ICD-10-CM | POA: Diagnosis not present

## 2013-09-07 DIAGNOSIS — N189 Chronic kidney disease, unspecified: Secondary | ICD-10-CM | POA: Diagnosis not present

## 2013-09-07 DIAGNOSIS — Z79899 Other long term (current) drug therapy: Secondary | ICD-10-CM | POA: Diagnosis not present

## 2013-09-14 DIAGNOSIS — E038 Other specified hypothyroidism: Secondary | ICD-10-CM | POA: Diagnosis not present

## 2013-09-14 DIAGNOSIS — I4891 Unspecified atrial fibrillation: Secondary | ICD-10-CM | POA: Diagnosis not present

## 2013-09-14 DIAGNOSIS — K5909 Other constipation: Secondary | ICD-10-CM | POA: Diagnosis not present

## 2013-09-14 DIAGNOSIS — M545 Low back pain, unspecified: Secondary | ICD-10-CM | POA: Diagnosis not present

## 2013-09-14 DIAGNOSIS — I10 Essential (primary) hypertension: Secondary | ICD-10-CM | POA: Diagnosis not present

## 2013-09-21 DIAGNOSIS — C91Z0 Other lymphoid leukemia not having achieved remission: Secondary | ICD-10-CM | POA: Diagnosis not present

## 2013-09-21 DIAGNOSIS — N189 Chronic kidney disease, unspecified: Secondary | ICD-10-CM | POA: Diagnosis not present

## 2013-09-21 DIAGNOSIS — D631 Anemia in chronic kidney disease: Secondary | ICD-10-CM | POA: Diagnosis not present

## 2013-10-05 DIAGNOSIS — D649 Anemia, unspecified: Secondary | ICD-10-CM | POA: Diagnosis not present

## 2013-10-23 DIAGNOSIS — N039 Chronic nephritic syndrome with unspecified morphologic changes: Secondary | ICD-10-CM | POA: Diagnosis not present

## 2013-10-23 DIAGNOSIS — D649 Anemia, unspecified: Secondary | ICD-10-CM | POA: Diagnosis not present

## 2013-10-23 DIAGNOSIS — C91Z0 Other lymphoid leukemia not having achieved remission: Secondary | ICD-10-CM | POA: Diagnosis not present

## 2013-10-23 DIAGNOSIS — D631 Anemia in chronic kidney disease: Secondary | ICD-10-CM | POA: Diagnosis not present

## 2013-10-30 DIAGNOSIS — R269 Unspecified abnormalities of gait and mobility: Secondary | ICD-10-CM | POA: Diagnosis not present

## 2013-11-10 DIAGNOSIS — C91Z0 Other lymphoid leukemia not having achieved remission: Secondary | ICD-10-CM | POA: Diagnosis not present

## 2013-11-10 DIAGNOSIS — N189 Chronic kidney disease, unspecified: Secondary | ICD-10-CM | POA: Diagnosis not present

## 2013-11-10 DIAGNOSIS — D631 Anemia in chronic kidney disease: Secondary | ICD-10-CM | POA: Diagnosis not present

## 2013-11-10 DIAGNOSIS — Z79899 Other long term (current) drug therapy: Secondary | ICD-10-CM | POA: Diagnosis not present

## 2013-11-16 DIAGNOSIS — F518 Other sleep disorders not due to a substance or known physiological condition: Secondary | ICD-10-CM | POA: Diagnosis not present

## 2013-11-16 DIAGNOSIS — I669 Occlusion and stenosis of unspecified cerebral artery: Secondary | ICD-10-CM | POA: Diagnosis not present

## 2013-11-16 DIAGNOSIS — F3289 Other specified depressive episodes: Secondary | ICD-10-CM | POA: Diagnosis not present

## 2013-11-16 DIAGNOSIS — F329 Major depressive disorder, single episode, unspecified: Secondary | ICD-10-CM | POA: Diagnosis not present

## 2013-11-16 DIAGNOSIS — N390 Urinary tract infection, site not specified: Secondary | ICD-10-CM | POA: Diagnosis not present

## 2013-11-16 DIAGNOSIS — R262 Difficulty in walking, not elsewhere classified: Secondary | ICD-10-CM | POA: Diagnosis not present

## 2013-11-16 DIAGNOSIS — R319 Hematuria, unspecified: Secondary | ICD-10-CM | POA: Diagnosis not present

## 2013-11-16 DIAGNOSIS — I10 Essential (primary) hypertension: Secondary | ICD-10-CM | POA: Diagnosis not present

## 2013-11-16 DIAGNOSIS — F411 Generalized anxiety disorder: Secondary | ICD-10-CM | POA: Diagnosis not present

## 2013-11-21 DIAGNOSIS — Z9181 History of falling: Secondary | ICD-10-CM | POA: Diagnosis not present

## 2013-11-21 DIAGNOSIS — H53419 Scotoma involving central area, unspecified eye: Secondary | ICD-10-CM | POA: Diagnosis not present

## 2013-11-23 ENCOUNTER — Inpatient Hospital Stay: Payer: Self-pay | Admitting: Internal Medicine

## 2013-11-23 DIAGNOSIS — I129 Hypertensive chronic kidney disease with stage 1 through stage 4 chronic kidney disease, or unspecified chronic kidney disease: Secondary | ICD-10-CM | POA: Diagnosis present

## 2013-11-23 DIAGNOSIS — R300 Dysuria: Secondary | ICD-10-CM | POA: Diagnosis not present

## 2013-11-23 DIAGNOSIS — Z6825 Body mass index (BMI) 25.0-25.9, adult: Secondary | ICD-10-CM | POA: Diagnosis not present

## 2013-11-23 DIAGNOSIS — Z96659 Presence of unspecified artificial knee joint: Secondary | ICD-10-CM | POA: Diagnosis not present

## 2013-11-23 DIAGNOSIS — C9590 Leukemia, unspecified not having achieved remission: Secondary | ICD-10-CM | POA: Diagnosis not present

## 2013-11-23 DIAGNOSIS — E119 Type 2 diabetes mellitus without complications: Secondary | ICD-10-CM | POA: Diagnosis not present

## 2013-11-23 DIAGNOSIS — A498 Other bacterial infections of unspecified site: Secondary | ICD-10-CM | POA: Diagnosis present

## 2013-11-23 DIAGNOSIS — N179 Acute kidney failure, unspecified: Secondary | ICD-10-CM | POA: Diagnosis not present

## 2013-11-23 DIAGNOSIS — Z8673 Personal history of transient ischemic attack (TIA), and cerebral infarction without residual deficits: Secondary | ICD-10-CM | POA: Diagnosis not present

## 2013-11-23 DIAGNOSIS — M1990 Unspecified osteoarthritis, unspecified site: Secondary | ICD-10-CM | POA: Diagnosis present

## 2013-11-23 DIAGNOSIS — N390 Urinary tract infection, site not specified: Secondary | ICD-10-CM | POA: Diagnosis not present

## 2013-11-23 DIAGNOSIS — I2510 Atherosclerotic heart disease of native coronary artery without angina pectoris: Secondary | ICD-10-CM | POA: Diagnosis present

## 2013-11-23 DIAGNOSIS — N3000 Acute cystitis without hematuria: Secondary | ICD-10-CM | POA: Diagnosis not present

## 2013-11-23 DIAGNOSIS — N3289 Other specified disorders of bladder: Secondary | ICD-10-CM | POA: Diagnosis present

## 2013-11-23 DIAGNOSIS — D638 Anemia in other chronic diseases classified elsewhere: Secondary | ICD-10-CM | POA: Diagnosis present

## 2013-11-23 DIAGNOSIS — E860 Dehydration: Secondary | ICD-10-CM | POA: Diagnosis present

## 2013-11-23 DIAGNOSIS — R21 Rash and other nonspecific skin eruption: Secondary | ICD-10-CM | POA: Diagnosis present

## 2013-11-23 DIAGNOSIS — E785 Hyperlipidemia, unspecified: Secondary | ICD-10-CM | POA: Diagnosis present

## 2013-11-23 DIAGNOSIS — E440 Moderate protein-calorie malnutrition: Secondary | ICD-10-CM | POA: Diagnosis present

## 2013-11-23 DIAGNOSIS — E039 Hypothyroidism, unspecified: Secondary | ICD-10-CM | POA: Diagnosis present

## 2013-11-23 DIAGNOSIS — N189 Chronic kidney disease, unspecified: Secondary | ICD-10-CM | POA: Diagnosis present

## 2013-11-23 DIAGNOSIS — Z856 Personal history of leukemia: Secondary | ICD-10-CM | POA: Diagnosis not present

## 2013-11-23 DIAGNOSIS — I10 Essential (primary) hypertension: Secondary | ICD-10-CM | POA: Diagnosis not present

## 2013-11-23 DIAGNOSIS — Z9861 Coronary angioplasty status: Secondary | ICD-10-CM | POA: Diagnosis not present

## 2013-11-23 DIAGNOSIS — D569 Thalassemia, unspecified: Secondary | ICD-10-CM | POA: Diagnosis present

## 2013-11-23 LAB — COMPREHENSIVE METABOLIC PANEL
Albumin: 3.6 g/dL (ref 3.4–5.0)
Alkaline Phosphatase: 40 U/L — ABNORMAL LOW
Anion Gap: 6 — ABNORMAL LOW (ref 7–16)
BUN: 20 mg/dL — ABNORMAL HIGH (ref 7–18)
Bilirubin,Total: 1.2 mg/dL — ABNORMAL HIGH (ref 0.2–1.0)
Calcium, Total: 9.1 mg/dL (ref 8.5–10.1)
Chloride: 104 mmol/L (ref 98–107)
Co2: 25 mmol/L (ref 21–32)
Creatinine: 1.51 mg/dL — ABNORMAL HIGH (ref 0.60–1.30)
EGFR (African American): 37 — ABNORMAL LOW
EGFR (Non-African Amer.): 32 — ABNORMAL LOW
Glucose: 113 mg/dL — ABNORMAL HIGH (ref 65–99)
Osmolality: 274 (ref 275–301)
Potassium: 3.9 mmol/L (ref 3.5–5.1)
SGOT(AST): 32 U/L (ref 15–37)
SGPT (ALT): 29 U/L (ref 12–78)
Sodium: 135 mmol/L — ABNORMAL LOW (ref 136–145)
Total Protein: 7.6 g/dL (ref 6.4–8.2)

## 2013-11-23 LAB — URINALYSIS, COMPLETE
Bilirubin,UR: NEGATIVE
Glucose,UR: NEGATIVE mg/dL (ref 0–75)
Hyaline Cast: 5
Ketone: NEGATIVE
Nitrite: NEGATIVE
Ph: 5 (ref 4.5–8.0)
Protein: NEGATIVE
RBC,UR: 18 /HPF (ref 0–5)
Specific Gravity: 1.016 (ref 1.003–1.030)
Squamous Epithelial: 2
WBC UR: 173 /HPF (ref 0–5)

## 2013-11-23 LAB — CBC
HCT: 32.8 % — ABNORMAL LOW (ref 35.0–47.0)
HGB: 10.1 g/dL — ABNORMAL LOW (ref 12.0–16.0)
MCH: 19.7 pg — ABNORMAL LOW (ref 26.0–34.0)
MCHC: 30.8 g/dL — ABNORMAL LOW (ref 32.0–36.0)
MCV: 64 fL — ABNORMAL LOW (ref 80–100)
Platelet: 142 10*3/uL — ABNORMAL LOW (ref 150–440)
RBC: 5.14 10*6/uL (ref 3.80–5.20)
RDW: 18.6 % — ABNORMAL HIGH (ref 11.5–14.5)
WBC: 7.4 10*3/uL (ref 3.6–11.0)

## 2013-11-23 LAB — TROPONIN I: Troponin-I: <0.02 ng/mL

## 2013-11-24 LAB — CBC WITH DIFFERENTIAL/PLATELET
Basophil #: 0 10*3/uL (ref 0.0–0.1)
Basophil %: 0.3 %
Eosinophil #: 0.1 10*3/uL (ref 0.0–0.7)
Eosinophil %: 1.4 %
HCT: 30.8 % — ABNORMAL LOW (ref 35.0–47.0)
HGB: 9.6 g/dL — ABNORMAL LOW (ref 12.0–16.0)
Lymphocyte #: 2.9 10*3/uL (ref 1.0–3.6)
Lymphocyte %: 45.3 %
MCH: 19.7 pg — ABNORMAL LOW (ref 26.0–34.0)
MCHC: 31.1 g/dL — ABNORMAL LOW (ref 32.0–36.0)
MCV: 63 fL — ABNORMAL LOW (ref 80–100)
Monocyte #: 1 x10 3/mm — ABNORMAL HIGH (ref 0.2–0.9)
Monocyte %: 16.1 %
Neutrophil #: 2.3 10*3/uL (ref 1.4–6.5)
Neutrophil %: 36.9 %
Platelet: 109 10*3/uL — ABNORMAL LOW (ref 150–440)
RBC: 4.87 10*6/uL (ref 3.80–5.20)
RDW: 18.4 % — ABNORMAL HIGH (ref 11.5–14.5)
WBC: 6.3 10*3/uL (ref 3.6–11.0)

## 2013-11-24 LAB — BASIC METABOLIC PANEL
Anion Gap: 8 (ref 7–16)
BUN: 18 mg/dL (ref 7–18)
Calcium, Total: 8.9 mg/dL (ref 8.5–10.1)
Chloride: 105 mmol/L (ref 98–107)
Co2: 25 mmol/L (ref 21–32)
Creatinine: 1.32 mg/dL — ABNORMAL HIGH (ref 0.60–1.30)
EGFR (African American): 44 — ABNORMAL LOW
EGFR (Non-African Amer.): 38 — ABNORMAL LOW
Glucose: 98 mg/dL (ref 65–99)
Osmolality: 278 (ref 275–301)
Potassium: 3.6 mmol/L (ref 3.5–5.1)
Sodium: 138 mmol/L (ref 136–145)

## 2013-11-26 LAB — URINE CULTURE

## 2013-12-07 DIAGNOSIS — C91Z0 Other lymphoid leukemia not having achieved remission: Secondary | ICD-10-CM | POA: Diagnosis not present

## 2013-12-07 DIAGNOSIS — N189 Chronic kidney disease, unspecified: Secondary | ICD-10-CM | POA: Diagnosis not present

## 2013-12-07 DIAGNOSIS — D631 Anemia in chronic kidney disease: Secondary | ICD-10-CM | POA: Diagnosis not present

## 2013-12-07 DIAGNOSIS — N039 Chronic nephritic syndrome with unspecified morphologic changes: Secondary | ICD-10-CM | POA: Diagnosis not present

## 2013-12-07 DIAGNOSIS — Z79899 Other long term (current) drug therapy: Secondary | ICD-10-CM | POA: Diagnosis not present

## 2013-12-07 DIAGNOSIS — Z5181 Encounter for therapeutic drug level monitoring: Secondary | ICD-10-CM | POA: Diagnosis not present

## 2013-12-19 DIAGNOSIS — S40029A Contusion of unspecified upper arm, initial encounter: Secondary | ICD-10-CM | POA: Diagnosis not present

## 2013-12-19 DIAGNOSIS — IMO0002 Reserved for concepts with insufficient information to code with codable children: Secondary | ICD-10-CM | POA: Diagnosis not present

## 2013-12-19 DIAGNOSIS — S32009A Unspecified fracture of unspecified lumbar vertebra, initial encounter for closed fracture: Secondary | ICD-10-CM | POA: Insufficient documentation

## 2013-12-19 DIAGNOSIS — M79609 Pain in unspecified limb: Secondary | ICD-10-CM | POA: Diagnosis not present

## 2013-12-19 DIAGNOSIS — M4856XA Collapsed vertebra, not elsewhere classified, lumbar region, initial encounter for fracture: Secondary | ICD-10-CM | POA: Insufficient documentation

## 2013-12-21 DIAGNOSIS — N390 Urinary tract infection, site not specified: Secondary | ICD-10-CM | POA: Diagnosis not present

## 2013-12-21 DIAGNOSIS — R319 Hematuria, unspecified: Secondary | ICD-10-CM | POA: Diagnosis not present

## 2013-12-22 DIAGNOSIS — D631 Anemia in chronic kidney disease: Secondary | ICD-10-CM | POA: Diagnosis not present

## 2013-12-22 DIAGNOSIS — C91Z0 Other lymphoid leukemia not having achieved remission: Secondary | ICD-10-CM | POA: Diagnosis not present

## 2013-12-22 DIAGNOSIS — N189 Chronic kidney disease, unspecified: Secondary | ICD-10-CM | POA: Diagnosis not present

## 2013-12-25 DIAGNOSIS — N189 Chronic kidney disease, unspecified: Secondary | ICD-10-CM | POA: Diagnosis not present

## 2013-12-25 DIAGNOSIS — C9110 Chronic lymphocytic leukemia of B-cell type not having achieved remission: Secondary | ICD-10-CM | POA: Diagnosis not present

## 2013-12-25 DIAGNOSIS — N390 Urinary tract infection, site not specified: Secondary | ICD-10-CM | POA: Diagnosis not present

## 2013-12-26 DIAGNOSIS — N390 Urinary tract infection, site not specified: Secondary | ICD-10-CM | POA: Diagnosis not present

## 2013-12-26 DIAGNOSIS — I10 Essential (primary) hypertension: Secondary | ICD-10-CM | POA: Diagnosis not present

## 2013-12-26 DIAGNOSIS — R319 Hematuria, unspecified: Secondary | ICD-10-CM | POA: Diagnosis not present

## 2013-12-26 DIAGNOSIS — R262 Difficulty in walking, not elsewhere classified: Secondary | ICD-10-CM | POA: Diagnosis not present

## 2014-01-04 DIAGNOSIS — N189 Chronic kidney disease, unspecified: Secondary | ICD-10-CM | POA: Diagnosis not present

## 2014-01-04 DIAGNOSIS — D631 Anemia in chronic kidney disease: Secondary | ICD-10-CM | POA: Diagnosis not present

## 2014-01-04 DIAGNOSIS — Z79899 Other long term (current) drug therapy: Secondary | ICD-10-CM | POA: Diagnosis not present

## 2014-01-04 DIAGNOSIS — C91Z0 Other lymphoid leukemia not having achieved remission: Secondary | ICD-10-CM | POA: Diagnosis not present

## 2014-01-11 DIAGNOSIS — I6350 Cerebral infarction due to unspecified occlusion or stenosis of unspecified cerebral artery: Secondary | ICD-10-CM | POA: Insufficient documentation

## 2014-01-11 DIAGNOSIS — N390 Urinary tract infection, site not specified: Secondary | ICD-10-CM | POA: Diagnosis not present

## 2014-01-11 DIAGNOSIS — I2510 Atherosclerotic heart disease of native coronary artery without angina pectoris: Secondary | ICD-10-CM | POA: Insufficient documentation

## 2014-01-18 DIAGNOSIS — N189 Chronic kidney disease, unspecified: Secondary | ICD-10-CM | POA: Diagnosis not present

## 2014-01-18 DIAGNOSIS — C91Z0 Other lymphoid leukemia not having achieved remission: Secondary | ICD-10-CM | POA: Diagnosis not present

## 2014-01-18 DIAGNOSIS — D631 Anemia in chronic kidney disease: Secondary | ICD-10-CM | POA: Diagnosis not present

## 2014-01-30 DIAGNOSIS — N390 Urinary tract infection, site not specified: Secondary | ICD-10-CM | POA: Diagnosis not present

## 2014-01-30 DIAGNOSIS — E038 Other specified hypothyroidism: Secondary | ICD-10-CM | POA: Diagnosis not present

## 2014-01-30 DIAGNOSIS — M545 Low back pain, unspecified: Secondary | ICD-10-CM | POA: Diagnosis not present

## 2014-01-30 DIAGNOSIS — R262 Difficulty in walking, not elsewhere classified: Secondary | ICD-10-CM | POA: Diagnosis not present

## 2014-01-30 DIAGNOSIS — I10 Essential (primary) hypertension: Secondary | ICD-10-CM | POA: Diagnosis not present

## 2014-01-30 DIAGNOSIS — R319 Hematuria, unspecified: Secondary | ICD-10-CM | POA: Diagnosis not present

## 2014-02-01 DIAGNOSIS — N3020 Other chronic cystitis without hematuria: Secondary | ICD-10-CM | POA: Diagnosis not present

## 2014-02-01 DIAGNOSIS — N189 Chronic kidney disease, unspecified: Secondary | ICD-10-CM | POA: Diagnosis not present

## 2014-02-01 DIAGNOSIS — C91Z0 Other lymphoid leukemia not having achieved remission: Secondary | ICD-10-CM | POA: Diagnosis not present

## 2014-02-01 DIAGNOSIS — D631 Anemia in chronic kidney disease: Secondary | ICD-10-CM | POA: Diagnosis not present

## 2014-02-01 DIAGNOSIS — Z5181 Encounter for therapeutic drug level monitoring: Secondary | ICD-10-CM | POA: Diagnosis not present

## 2014-02-01 DIAGNOSIS — Z79899 Other long term (current) drug therapy: Secondary | ICD-10-CM | POA: Diagnosis not present

## 2014-02-16 DIAGNOSIS — D649 Anemia, unspecified: Secondary | ICD-10-CM | POA: Diagnosis not present

## 2014-02-16 DIAGNOSIS — C91Z0 Other lymphoid leukemia not having achieved remission: Secondary | ICD-10-CM | POA: Diagnosis not present

## 2014-02-20 DIAGNOSIS — N390 Urinary tract infection, site not specified: Secondary | ICD-10-CM | POA: Diagnosis not present

## 2014-03-01 DIAGNOSIS — N189 Chronic kidney disease, unspecified: Secondary | ICD-10-CM | POA: Diagnosis not present

## 2014-03-01 DIAGNOSIS — D631 Anemia in chronic kidney disease: Secondary | ICD-10-CM | POA: Diagnosis not present

## 2014-03-15 DIAGNOSIS — C91Z0 Other lymphoid leukemia not having achieved remission: Secondary | ICD-10-CM | POA: Diagnosis not present

## 2014-03-15 DIAGNOSIS — D631 Anemia in chronic kidney disease: Secondary | ICD-10-CM | POA: Diagnosis not present

## 2014-03-15 DIAGNOSIS — N039 Chronic nephritic syndrome with unspecified morphologic changes: Secondary | ICD-10-CM | POA: Diagnosis not present

## 2014-03-29 DIAGNOSIS — D631 Anemia in chronic kidney disease: Secondary | ICD-10-CM | POA: Diagnosis not present

## 2014-03-29 DIAGNOSIS — N039 Chronic nephritic syndrome with unspecified morphologic changes: Secondary | ICD-10-CM | POA: Diagnosis not present

## 2014-03-29 DIAGNOSIS — I129 Hypertensive chronic kidney disease with stage 1 through stage 4 chronic kidney disease, or unspecified chronic kidney disease: Secondary | ICD-10-CM | POA: Diagnosis not present

## 2014-03-29 DIAGNOSIS — D696 Thrombocytopenia, unspecified: Secondary | ICD-10-CM | POA: Diagnosis not present

## 2014-03-29 DIAGNOSIS — C91Z0 Other lymphoid leukemia not having achieved remission: Secondary | ICD-10-CM | POA: Diagnosis not present

## 2014-03-29 DIAGNOSIS — N189 Chronic kidney disease, unspecified: Secondary | ICD-10-CM | POA: Diagnosis not present

## 2014-04-12 DIAGNOSIS — Z79899 Other long term (current) drug therapy: Secondary | ICD-10-CM | POA: Diagnosis not present

## 2014-04-12 DIAGNOSIS — N189 Chronic kidney disease, unspecified: Secondary | ICD-10-CM | POA: Diagnosis not present

## 2014-04-12 DIAGNOSIS — C91Z0 Other lymphoid leukemia not having achieved remission: Secondary | ICD-10-CM | POA: Diagnosis not present

## 2014-04-12 DIAGNOSIS — D631 Anemia in chronic kidney disease: Secondary | ICD-10-CM | POA: Diagnosis not present

## 2014-04-18 DIAGNOSIS — I779 Disorder of arteries and arterioles, unspecified: Secondary | ICD-10-CM | POA: Insufficient documentation

## 2014-04-18 DIAGNOSIS — I6529 Occlusion and stenosis of unspecified carotid artery: Secondary | ICD-10-CM | POA: Diagnosis not present

## 2014-04-18 DIAGNOSIS — I2510 Atherosclerotic heart disease of native coronary artery without angina pectoris: Secondary | ICD-10-CM | POA: Diagnosis not present

## 2014-04-18 DIAGNOSIS — E785 Hyperlipidemia, unspecified: Secondary | ICD-10-CM | POA: Diagnosis not present

## 2014-04-18 DIAGNOSIS — I10 Essential (primary) hypertension: Secondary | ICD-10-CM | POA: Diagnosis not present

## 2014-04-18 DIAGNOSIS — I739 Peripheral vascular disease, unspecified: Secondary | ICD-10-CM

## 2014-04-18 DIAGNOSIS — I639 Cerebral infarction, unspecified: Secondary | ICD-10-CM | POA: Insufficient documentation

## 2014-04-26 DIAGNOSIS — N189 Chronic kidney disease, unspecified: Secondary | ICD-10-CM | POA: Diagnosis not present

## 2014-04-26 DIAGNOSIS — N039 Chronic nephritic syndrome with unspecified morphologic changes: Secondary | ICD-10-CM | POA: Diagnosis not present

## 2014-04-26 DIAGNOSIS — D631 Anemia in chronic kidney disease: Secondary | ICD-10-CM | POA: Diagnosis not present

## 2014-05-01 DIAGNOSIS — G2589 Other specified extrapyramidal and movement disorders: Secondary | ICD-10-CM | POA: Diagnosis not present

## 2014-05-01 DIAGNOSIS — R262 Difficulty in walking, not elsewhere classified: Secondary | ICD-10-CM | POA: Diagnosis not present

## 2014-05-01 DIAGNOSIS — E119 Type 2 diabetes mellitus without complications: Secondary | ICD-10-CM | POA: Diagnosis not present

## 2014-05-01 DIAGNOSIS — E038 Other specified hypothyroidism: Secondary | ICD-10-CM | POA: Diagnosis not present

## 2014-05-01 DIAGNOSIS — I10 Essential (primary) hypertension: Secondary | ICD-10-CM | POA: Diagnosis not present

## 2014-05-01 DIAGNOSIS — R5383 Other fatigue: Secondary | ICD-10-CM | POA: Diagnosis not present

## 2014-05-01 DIAGNOSIS — K5909 Other constipation: Secondary | ICD-10-CM | POA: Diagnosis not present

## 2014-05-01 DIAGNOSIS — R5381 Other malaise: Secondary | ICD-10-CM | POA: Diagnosis not present

## 2014-05-03 DIAGNOSIS — R1312 Dysphagia, oropharyngeal phase: Secondary | ICD-10-CM | POA: Diagnosis not present

## 2014-05-03 DIAGNOSIS — R634 Abnormal weight loss: Secondary | ICD-10-CM | POA: Insufficient documentation

## 2014-05-03 DIAGNOSIS — K5900 Constipation, unspecified: Secondary | ICD-10-CM | POA: Diagnosis not present

## 2014-05-03 DIAGNOSIS — K581 Irritable bowel syndrome with constipation: Secondary | ICD-10-CM | POA: Insufficient documentation

## 2014-05-03 DIAGNOSIS — R1310 Dysphagia, unspecified: Secondary | ICD-10-CM | POA: Insufficient documentation

## 2014-05-10 DIAGNOSIS — N189 Chronic kidney disease, unspecified: Secondary | ICD-10-CM | POA: Diagnosis not present

## 2014-05-10 DIAGNOSIS — D631 Anemia in chronic kidney disease: Secondary | ICD-10-CM | POA: Diagnosis not present

## 2014-05-10 DIAGNOSIS — C91Z0 Other lymphoid leukemia not having achieved remission: Secondary | ICD-10-CM | POA: Diagnosis not present

## 2014-05-22 DIAGNOSIS — G939 Disorder of brain, unspecified: Secondary | ICD-10-CM | POA: Insufficient documentation

## 2014-05-22 DIAGNOSIS — I6782 Cerebral ischemia: Secondary | ICD-10-CM | POA: Insufficient documentation

## 2014-05-22 DIAGNOSIS — N3020 Other chronic cystitis without hematuria: Secondary | ICD-10-CM | POA: Diagnosis not present

## 2014-05-22 DIAGNOSIS — E782 Mixed hyperlipidemia: Secondary | ICD-10-CM | POA: Insufficient documentation

## 2014-05-25 DIAGNOSIS — N189 Chronic kidney disease, unspecified: Secondary | ICD-10-CM | POA: Diagnosis not present

## 2014-05-25 DIAGNOSIS — D631 Anemia in chronic kidney disease: Secondary | ICD-10-CM | POA: Diagnosis not present

## 2014-06-07 DIAGNOSIS — D631 Anemia in chronic kidney disease: Secondary | ICD-10-CM | POA: Diagnosis not present

## 2014-06-07 DIAGNOSIS — Z79899 Other long term (current) drug therapy: Secondary | ICD-10-CM | POA: Diagnosis not present

## 2014-06-07 DIAGNOSIS — N189 Chronic kidney disease, unspecified: Secondary | ICD-10-CM | POA: Diagnosis not present

## 2014-06-07 DIAGNOSIS — I129 Hypertensive chronic kidney disease with stage 1 through stage 4 chronic kidney disease, or unspecified chronic kidney disease: Secondary | ICD-10-CM | POA: Diagnosis not present

## 2014-06-07 DIAGNOSIS — C91Z0 Other lymphoid leukemia not having achieved remission: Secondary | ICD-10-CM | POA: Diagnosis not present

## 2014-06-07 DIAGNOSIS — Z5181 Encounter for therapeutic drug level monitoring: Secondary | ICD-10-CM | POA: Diagnosis not present

## 2014-06-21 DIAGNOSIS — Z23 Encounter for immunization: Secondary | ICD-10-CM | POA: Diagnosis not present

## 2014-07-05 DIAGNOSIS — M4698 Unspecified inflammatory spondylopathy, sacral and sacrococcygeal region: Secondary | ICD-10-CM | POA: Diagnosis not present

## 2014-07-05 DIAGNOSIS — M4856XS Collapsed vertebra, not elsewhere classified, lumbar region, sequela of fracture: Secondary | ICD-10-CM | POA: Diagnosis not present

## 2014-07-05 DIAGNOSIS — M549 Dorsalgia, unspecified: Secondary | ICD-10-CM | POA: Diagnosis not present

## 2014-07-05 DIAGNOSIS — Z79899 Other long term (current) drug therapy: Secondary | ICD-10-CM | POA: Diagnosis not present

## 2014-07-05 DIAGNOSIS — M47818 Spondylosis without myelopathy or radiculopathy, sacral and sacrococcygeal region: Secondary | ICD-10-CM | POA: Insufficient documentation

## 2014-07-05 DIAGNOSIS — C91Z0 Other lymphoid leukemia not having achieved remission: Secondary | ICD-10-CM | POA: Diagnosis not present

## 2014-07-12 DIAGNOSIS — G4700 Insomnia, unspecified: Secondary | ICD-10-CM | POA: Diagnosis not present

## 2014-07-12 DIAGNOSIS — E119 Type 2 diabetes mellitus without complications: Secondary | ICD-10-CM | POA: Diagnosis not present

## 2014-07-12 DIAGNOSIS — M150 Primary generalized (osteo)arthritis: Secondary | ICD-10-CM | POA: Diagnosis not present

## 2014-07-12 DIAGNOSIS — D519 Vitamin B12 deficiency anemia, unspecified: Secondary | ICD-10-CM | POA: Diagnosis not present

## 2014-07-12 DIAGNOSIS — F411 Generalized anxiety disorder: Secondary | ICD-10-CM | POA: Diagnosis not present

## 2014-07-12 DIAGNOSIS — E782 Mixed hyperlipidemia: Secondary | ICD-10-CM | POA: Diagnosis not present

## 2014-07-12 DIAGNOSIS — I10 Essential (primary) hypertension: Secondary | ICD-10-CM | POA: Diagnosis not present

## 2014-07-12 DIAGNOSIS — E039 Hypothyroidism, unspecified: Secondary | ICD-10-CM | POA: Diagnosis not present

## 2014-07-26 DIAGNOSIS — D649 Anemia, unspecified: Secondary | ICD-10-CM | POA: Diagnosis not present

## 2014-08-02 DIAGNOSIS — N189 Chronic kidney disease, unspecified: Secondary | ICD-10-CM | POA: Diagnosis not present

## 2014-08-02 DIAGNOSIS — D631 Anemia in chronic kidney disease: Secondary | ICD-10-CM | POA: Diagnosis not present

## 2014-08-02 DIAGNOSIS — E039 Hypothyroidism, unspecified: Secondary | ICD-10-CM | POA: Diagnosis not present

## 2014-08-02 DIAGNOSIS — C91Z0 Other lymphoid leukemia not having achieved remission: Secondary | ICD-10-CM | POA: Diagnosis not present

## 2014-08-20 DIAGNOSIS — D649 Anemia, unspecified: Secondary | ICD-10-CM | POA: Diagnosis not present

## 2014-08-21 DIAGNOSIS — I6523 Occlusion and stenosis of bilateral carotid arteries: Secondary | ICD-10-CM | POA: Diagnosis not present

## 2014-08-21 DIAGNOSIS — I10 Essential (primary) hypertension: Secondary | ICD-10-CM | POA: Diagnosis not present

## 2014-08-21 DIAGNOSIS — E782 Mixed hyperlipidemia: Secondary | ICD-10-CM | POA: Diagnosis not present

## 2014-08-21 DIAGNOSIS — I2510 Atherosclerotic heart disease of native coronary artery without angina pectoris: Secondary | ICD-10-CM | POA: Diagnosis not present

## 2014-08-30 DIAGNOSIS — D631 Anemia in chronic kidney disease: Secondary | ICD-10-CM | POA: Diagnosis not present

## 2014-08-30 DIAGNOSIS — N189 Chronic kidney disease, unspecified: Secondary | ICD-10-CM | POA: Diagnosis not present

## 2014-08-30 DIAGNOSIS — C91Z0 Other lymphoid leukemia not having achieved remission: Secondary | ICD-10-CM | POA: Diagnosis not present

## 2014-09-03 DIAGNOSIS — I493 Ventricular premature depolarization: Secondary | ICD-10-CM | POA: Diagnosis not present

## 2014-09-03 DIAGNOSIS — R0602 Shortness of breath: Secondary | ICD-10-CM | POA: Diagnosis not present

## 2014-09-03 DIAGNOSIS — E782 Mixed hyperlipidemia: Secondary | ICD-10-CM | POA: Diagnosis not present

## 2014-09-03 DIAGNOSIS — I2510 Atherosclerotic heart disease of native coronary artery without angina pectoris: Secondary | ICD-10-CM | POA: Diagnosis not present

## 2014-09-03 DIAGNOSIS — R002 Palpitations: Secondary | ICD-10-CM | POA: Diagnosis not present

## 2014-09-13 DIAGNOSIS — D631 Anemia in chronic kidney disease: Secondary | ICD-10-CM | POA: Diagnosis not present

## 2014-09-13 DIAGNOSIS — N189 Chronic kidney disease, unspecified: Secondary | ICD-10-CM | POA: Diagnosis not present

## 2014-09-13 DIAGNOSIS — C91Z0 Other lymphoid leukemia not having achieved remission: Secondary | ICD-10-CM | POA: Diagnosis not present

## 2014-09-13 DIAGNOSIS — E039 Hypothyroidism, unspecified: Secondary | ICD-10-CM | POA: Diagnosis not present

## 2014-09-20 DIAGNOSIS — E119 Type 2 diabetes mellitus without complications: Secondary | ICD-10-CM | POA: Diagnosis not present

## 2014-09-20 DIAGNOSIS — D519 Vitamin B12 deficiency anemia, unspecified: Secondary | ICD-10-CM | POA: Diagnosis not present

## 2014-09-20 DIAGNOSIS — E782 Mixed hyperlipidemia: Secondary | ICD-10-CM | POA: Diagnosis not present

## 2014-09-20 DIAGNOSIS — I10 Essential (primary) hypertension: Secondary | ICD-10-CM | POA: Diagnosis not present

## 2014-09-20 DIAGNOSIS — M150 Primary generalized (osteo)arthritis: Secondary | ICD-10-CM | POA: Diagnosis not present

## 2014-09-27 DIAGNOSIS — D649 Anemia, unspecified: Secondary | ICD-10-CM | POA: Diagnosis not present

## 2014-10-11 DIAGNOSIS — D631 Anemia in chronic kidney disease: Secondary | ICD-10-CM | POA: Diagnosis not present

## 2014-10-11 DIAGNOSIS — N189 Chronic kidney disease, unspecified: Secondary | ICD-10-CM | POA: Diagnosis not present

## 2014-10-11 DIAGNOSIS — I129 Hypertensive chronic kidney disease with stage 1 through stage 4 chronic kidney disease, or unspecified chronic kidney disease: Secondary | ICD-10-CM | POA: Diagnosis not present

## 2014-10-15 ENCOUNTER — Emergency Department: Payer: Self-pay | Admitting: Emergency Medicine

## 2014-10-15 DIAGNOSIS — F809 Developmental disorder of speech and language, unspecified: Secondary | ICD-10-CM | POA: Diagnosis not present

## 2014-10-15 DIAGNOSIS — Z880 Allergy status to penicillin: Secondary | ICD-10-CM | POA: Diagnosis not present

## 2014-10-15 DIAGNOSIS — R9431 Abnormal electrocardiogram [ECG] [EKG]: Secondary | ICD-10-CM | POA: Diagnosis not present

## 2014-10-15 DIAGNOSIS — R4781 Slurred speech: Secondary | ICD-10-CM | POA: Diagnosis not present

## 2014-10-15 DIAGNOSIS — R4789 Other speech disturbances: Secondary | ICD-10-CM | POA: Diagnosis not present

## 2014-10-15 DIAGNOSIS — J9811 Atelectasis: Secondary | ICD-10-CM | POA: Diagnosis not present

## 2014-10-15 DIAGNOSIS — G459 Transient cerebral ischemic attack, unspecified: Secondary | ICD-10-CM | POA: Diagnosis not present

## 2014-10-15 DIAGNOSIS — R0989 Other specified symptoms and signs involving the circulatory and respiratory systems: Secondary | ICD-10-CM | POA: Diagnosis not present

## 2014-10-15 DIAGNOSIS — R200 Anesthesia of skin: Secondary | ICD-10-CM | POA: Diagnosis not present

## 2014-10-17 DIAGNOSIS — M150 Primary generalized (osteo)arthritis: Secondary | ICD-10-CM | POA: Diagnosis not present

## 2014-10-17 DIAGNOSIS — I10 Essential (primary) hypertension: Secondary | ICD-10-CM | POA: Diagnosis not present

## 2014-10-17 DIAGNOSIS — G459 Transient cerebral ischemic attack, unspecified: Secondary | ICD-10-CM | POA: Diagnosis not present

## 2014-10-25 DIAGNOSIS — I129 Hypertensive chronic kidney disease with stage 1 through stage 4 chronic kidney disease, or unspecified chronic kidney disease: Secondary | ICD-10-CM | POA: Diagnosis not present

## 2014-10-25 DIAGNOSIS — N181 Chronic kidney disease, stage 1: Secondary | ICD-10-CM | POA: Diagnosis not present

## 2014-10-25 DIAGNOSIS — D638 Anemia in other chronic diseases classified elsewhere: Secondary | ICD-10-CM | POA: Diagnosis not present

## 2014-10-29 ENCOUNTER — Emergency Department: Payer: Self-pay | Admitting: Emergency Medicine

## 2014-10-29 DIAGNOSIS — S0083XA Contusion of other part of head, initial encounter: Secondary | ICD-10-CM | POA: Diagnosis not present

## 2014-10-29 DIAGNOSIS — S0101XA Laceration without foreign body of scalp, initial encounter: Secondary | ICD-10-CM | POA: Diagnosis not present

## 2014-10-29 DIAGNOSIS — S0190XA Unspecified open wound of unspecified part of head, initial encounter: Secondary | ICD-10-CM | POA: Diagnosis not present

## 2014-10-29 DIAGNOSIS — S0990XA Unspecified injury of head, initial encounter: Secondary | ICD-10-CM | POA: Diagnosis not present

## 2014-10-29 DIAGNOSIS — W19XXXA Unspecified fall, initial encounter: Secondary | ICD-10-CM | POA: Diagnosis not present

## 2014-10-29 DIAGNOSIS — Z880 Allergy status to penicillin: Secondary | ICD-10-CM | POA: Diagnosis not present

## 2014-10-29 DIAGNOSIS — S0181XA Laceration without foreign body of other part of head, initial encounter: Secondary | ICD-10-CM | POA: Diagnosis not present

## 2014-10-29 DIAGNOSIS — I10 Essential (primary) hypertension: Secondary | ICD-10-CM | POA: Diagnosis not present

## 2014-11-05 ENCOUNTER — Emergency Department: Payer: Self-pay | Admitting: Emergency Medicine

## 2014-11-05 DIAGNOSIS — R300 Dysuria: Secondary | ICD-10-CM | POA: Diagnosis not present

## 2014-11-05 DIAGNOSIS — Z4801 Encounter for change or removal of surgical wound dressing: Secondary | ICD-10-CM | POA: Diagnosis not present

## 2014-11-05 DIAGNOSIS — N390 Urinary tract infection, site not specified: Secondary | ICD-10-CM | POA: Diagnosis not present

## 2014-11-08 DIAGNOSIS — D638 Anemia in other chronic diseases classified elsewhere: Secondary | ICD-10-CM | POA: Diagnosis not present

## 2014-11-08 DIAGNOSIS — D649 Anemia, unspecified: Secondary | ICD-10-CM | POA: Diagnosis not present

## 2014-11-08 DIAGNOSIS — N189 Chronic kidney disease, unspecified: Secondary | ICD-10-CM | POA: Diagnosis not present

## 2014-11-08 DIAGNOSIS — D631 Anemia in chronic kidney disease: Secondary | ICD-10-CM | POA: Diagnosis not present

## 2014-11-08 DIAGNOSIS — C91Z0 Other lymphoid leukemia not having achieved remission: Secondary | ICD-10-CM | POA: Diagnosis not present

## 2014-11-09 ENCOUNTER — Encounter: Payer: Self-pay | Admitting: Psychiatry

## 2014-11-09 ENCOUNTER — Encounter: Payer: Self-pay | Admitting: Internal Medicine

## 2014-11-12 ENCOUNTER — Emergency Department: Payer: Self-pay | Admitting: Emergency Medicine

## 2014-11-12 DIAGNOSIS — Z880 Allergy status to penicillin: Secondary | ICD-10-CM | POA: Diagnosis not present

## 2014-11-12 DIAGNOSIS — I10 Essential (primary) hypertension: Secondary | ICD-10-CM | POA: Diagnosis not present

## 2014-11-12 DIAGNOSIS — N390 Urinary tract infection, site not specified: Secondary | ICD-10-CM | POA: Diagnosis not present

## 2014-11-12 DIAGNOSIS — B954 Other streptococcus as the cause of diseases classified elsewhere: Secondary | ICD-10-CM | POA: Diagnosis not present

## 2014-11-19 ENCOUNTER — Encounter
Admit: 2014-11-19 | Disposition: A | Discharge status: Home / Self Care | Payer: Self-pay | Attending: Internal Medicine | Admitting: Internal Medicine

## 2014-11-19 DIAGNOSIS — R262 Difficulty in walking, not elsewhere classified: Secondary | ICD-10-CM | POA: Diagnosis not present

## 2014-11-19 DIAGNOSIS — M6281 Muscle weakness (generalized): Secondary | ICD-10-CM | POA: Diagnosis not present

## 2014-11-20 DIAGNOSIS — M6281 Muscle weakness (generalized): Secondary | ICD-10-CM | POA: Diagnosis not present

## 2014-11-20 DIAGNOSIS — R262 Difficulty in walking, not elsewhere classified: Secondary | ICD-10-CM | POA: Diagnosis not present

## 2014-11-21 DIAGNOSIS — N390 Urinary tract infection, site not specified: Secondary | ICD-10-CM | POA: Diagnosis not present

## 2014-11-22 DIAGNOSIS — D638 Anemia in other chronic diseases classified elsewhere: Secondary | ICD-10-CM | POA: Diagnosis not present

## 2014-11-23 ENCOUNTER — Encounter
Admit: 2014-11-23 | Disposition: A | Discharge status: Home / Self Care | Payer: Self-pay | Attending: Internal Medicine | Admitting: Internal Medicine

## 2014-11-23 DIAGNOSIS — M6281 Muscle weakness (generalized): Secondary | ICD-10-CM | POA: Diagnosis not present

## 2014-11-23 DIAGNOSIS — R262 Difficulty in walking, not elsewhere classified: Secondary | ICD-10-CM | POA: Diagnosis not present

## 2014-11-27 DIAGNOSIS — M6281 Muscle weakness (generalized): Secondary | ICD-10-CM | POA: Diagnosis not present

## 2014-11-27 DIAGNOSIS — R262 Difficulty in walking, not elsewhere classified: Secondary | ICD-10-CM | POA: Diagnosis not present

## 2014-11-28 DIAGNOSIS — M6281 Muscle weakness (generalized): Secondary | ICD-10-CM | POA: Diagnosis not present

## 2014-11-28 DIAGNOSIS — R262 Difficulty in walking, not elsewhere classified: Secondary | ICD-10-CM | POA: Diagnosis not present

## 2014-12-03 DIAGNOSIS — R262 Difficulty in walking, not elsewhere classified: Secondary | ICD-10-CM | POA: Diagnosis not present

## 2014-12-03 DIAGNOSIS — M6281 Muscle weakness (generalized): Secondary | ICD-10-CM | POA: Diagnosis not present

## 2014-12-04 DIAGNOSIS — I63239 Cerebral infarction due to unspecified occlusion or stenosis of unspecified carotid arteries: Secondary | ICD-10-CM | POA: Diagnosis not present

## 2014-12-04 DIAGNOSIS — I639 Cerebral infarction, unspecified: Secondary | ICD-10-CM | POA: Diagnosis not present

## 2014-12-04 DIAGNOSIS — I6529 Occlusion and stenosis of unspecified carotid artery: Secondary | ICD-10-CM | POA: Diagnosis not present

## 2014-12-05 DIAGNOSIS — M6281 Muscle weakness (generalized): Secondary | ICD-10-CM | POA: Diagnosis not present

## 2014-12-05 DIAGNOSIS — R262 Difficulty in walking, not elsewhere classified: Secondary | ICD-10-CM | POA: Diagnosis not present

## 2014-12-06 DIAGNOSIS — I1310 Hypertensive heart and chronic kidney disease without heart failure, with stage 1 through stage 4 chronic kidney disease, or unspecified chronic kidney disease: Secondary | ICD-10-CM | POA: Diagnosis not present

## 2014-12-06 DIAGNOSIS — N189 Chronic kidney disease, unspecified: Secondary | ICD-10-CM | POA: Diagnosis not present

## 2014-12-06 DIAGNOSIS — Z5181 Encounter for therapeutic drug level monitoring: Secondary | ICD-10-CM | POA: Diagnosis not present

## 2014-12-06 DIAGNOSIS — C91Z0 Other lymphoid leukemia not having achieved remission: Secondary | ICD-10-CM | POA: Diagnosis not present

## 2014-12-06 DIAGNOSIS — D631 Anemia in chronic kidney disease: Secondary | ICD-10-CM | POA: Diagnosis not present

## 2014-12-06 DIAGNOSIS — Z79899 Other long term (current) drug therapy: Secondary | ICD-10-CM | POA: Diagnosis not present

## 2014-12-10 DIAGNOSIS — R296 Repeated falls: Secondary | ICD-10-CM | POA: Diagnosis not present

## 2014-12-10 DIAGNOSIS — H53413 Scotoma involving central area, bilateral: Secondary | ICD-10-CM | POA: Diagnosis not present

## 2014-12-11 DIAGNOSIS — M6281 Muscle weakness (generalized): Secondary | ICD-10-CM | POA: Diagnosis not present

## 2014-12-11 DIAGNOSIS — R262 Difficulty in walking, not elsewhere classified: Secondary | ICD-10-CM | POA: Diagnosis not present

## 2014-12-12 DIAGNOSIS — M6281 Muscle weakness (generalized): Secondary | ICD-10-CM | POA: Diagnosis not present

## 2014-12-12 DIAGNOSIS — R262 Difficulty in walking, not elsewhere classified: Secondary | ICD-10-CM | POA: Diagnosis not present

## 2014-12-13 DIAGNOSIS — M25512 Pain in left shoulder: Secondary | ICD-10-CM | POA: Diagnosis not present

## 2014-12-13 DIAGNOSIS — S46002A Unspecified injury of muscle(s) and tendon(s) of the rotator cuff of left shoulder, initial encounter: Secondary | ICD-10-CM | POA: Diagnosis not present

## 2014-12-13 DIAGNOSIS — M7502 Adhesive capsulitis of left shoulder: Secondary | ICD-10-CM | POA: Insufficient documentation

## 2014-12-13 DIAGNOSIS — G8929 Other chronic pain: Secondary | ICD-10-CM | POA: Diagnosis not present

## 2014-12-15 NOTE — H&P (Signed)
PATIENT NAME:  Stephanie Harris, Stephanie Harris MR#:  [id number] DATE OF BIRTH:  Mar 03, 1931  DATE OF ADMISSION:  11/24/2013  PRIMARY CARE PHYSICIAN:  Dr. Clayborn Bigness.   REFERRING PHYSICIAN:  Dr. Lenise Arena.   CHIEF COMPLAINT:  Urinary tract infection, failed on outpatient antibiotics.   HISTORY OF PRESENT ILLNESS:  The patient is an 81 year old pleasant Caucasian female with past medical history of leukemia, anemia, thalassemia, diabetes mellitus, hypertension, hyperlipidemia, coronary artery disease and hypothyroidism, was diagnosed with UTI by her primary care physician on March 26th.  The patient actually complained of some dysuria and hematuria to primary care physician on March 26th and she was started on Macrobid during that time.  Eventually her culture showed up E. coli which is sensitive to ceftriaxone.  The patient was also scheduled to get ultrasound of the kidneys which is not done.  In the interim, the patient felt like she was having palpitations after taking Macrodantin.  Also, the patient has feeling nauseous and not taking by mouth much.  Yesterday, she spiked temperature.  Primary care physician has recommended the patient to go to the hospital for IV antibiotics.  After the patient came into the ER, she was given IV Rocephin.  An ultrasound of the kidney was done, the report of which is pending at this time.  During my examination, the patient is reporting that she is having some lower abdominal discomfort and dysuria.  Denies any hematuria.  Complaining of nausea, but denies any vomiting.  No back pain.  Feeling weak and tired.  No other complaints.  Denies any chest pain, shortness of breath.   PAST MEDICAL HISTORY:  1.  Coronary artery disease status post RCA stent placement in March 2009.  She sees Dr. Nehemiah Massed as an outpatient.  2.  History of carotid artery stenosis status post carotid endarterectomy.  3.  History of leukemia, anemia and thalassemia.  Goes to [facility] and gets Procrit shot  every other week.  4.  Hypertension.  5.  Hyperlipidemia.  6.  History of left arm phlebitis.  7.  Diabetes mellitus.  8.  Hypothyroidism.  9.  History of TIA. 10.  History of osteoarthritis.   PAST SURGICAL HISTORY:  Cholecystectomy, hysterectomy, right knee replacement, carotid artery endarterectomy, back surgery.   PSYCHOSOCIAL HISTORY:  Married and lives with spouse.  Denies any history of smoking, alcohol or illicit drug usage.   FAMILY HISTORY:  Stroke and osteoporosis.   HOME MEDICATIONS:  Tramadol 50 mg 2 tablets by mouth once daily at bedtime, Synthroid 50 mcg once daily, Coreg 25 mg by mouth twice daily, amitriptyline 10 mg 2 tablets by mouth once daily, Procrit every other week.   REVIEW OF SYSTEMS:  CONSTITUTIONAL:  Had fever yesterday.  Complaining of fatigue and weakness.  EYES:  Denies blurry vision, double vision.  EARS, NOSE, THROAT:  Denies epistaxis, discharge, or hearing problems.   RESPIRATORY:  Denies cough, COPD.  CARDIOVASCULAR:  No chest pain.  Denies palpitations.  GASTROINTESTINAL:  Has nausea.  Denies vomiting, diarrhea.  Lower abdominal discomfort.  GENITOURINARY:  Complaining of dysuria.  Denies hematuria, renal calculi or urinary frequency. GYNECOLOGIC AND BREAST:  Denies breast mass or vaginal discharge.  ENDOCRINE:  Denies polyuria, nocturia.  Has diabetes mellitus and hypothyroidism. HEMATOLOGIC AND LYMPHATIC:  Has chronic anemia, leukemia, and thalassemia.  INTEGUMENTARY:  No acne, rash, lesions.  MUSCULOSKELETAL:  Has chronic low back pain.  Denies gout.  No swelling.  Denies any neck pain.  NEUROLOGIC:  Denies  vertigo, ataxia.  Has history of TIA.  PSYCHIATRIC:  No ADD, OCD.  PHYSICAL EXAMINATION:  VITAL SIGNS:  Temperature 97.8, pulse 87, respirations 20, blood pressure is 180/90, pulse ox 100% on room air.  GENERAL APPEARANCE:  Not under acute distress.  Moderately built and thin-looking female.  HEENT:  Normocephalic, atraumatic.  Pupils are  equally reacting to light and accommodation.  NECK:  Supple.  No JVD.  No thyromegaly.  Range of motion is intact. LUNGS:  Clear to auscultation bilaterally.  No accessory muscle usage.  No anterior chest wall tenderness on palpation.  CARDIAC:  S1, S2 normal.  Regular rate and rhythm.  No murmurs.  GASTROINTESTINAL:  Soft.  Bowel sounds are positive in all four quadrants.  Nontender, nondistended.  No hepatosplenomegaly.  Some discomfort is present on deep palpation in the lower abdominal part, but no rebound tenderness.  No masses felt.  NEUROLOGIC:  Awake, oriented x 3.  Motor and sensory grossly intact.  Reflexes are 2+.  Cranial nerves II through XII are intact.  EXTREMITIES:  No edema.  No cyanosis.  No clubbing.  SKIN:  Warm to touch.  Normal turgor.  No rashes.  No lesions.  MUSCULOSKELETAL:  No joint effusion, tenderness, erythema.  No CVA tenderness.  PSYCHIATRIC:  Normal mood and affect.   LABORATORY AND IMAGING STUDIES:  Troponin less than 0.02.  CBC:  Hemoglobin 10.1, hematocrit 32.8, platelet count 142.  WBC 7.4.  Urinalysis yellow in color, cloudy in appearance, glucose, bilirubin and ketones are negative, leukocyte esterase 3+, nitrite negative.  WBC clumps are presents.  Hyaline casts are present.  Chem-8:  Glucose 113, BUN 20, creatinine 1.51, sodium 135, potassium and chloride are normal, CO2 is normal, GFR 32, anion gap 6, serum osmolality 274, calcium 9.1.  Urine culture obtained by the primary care physician on March 26th has revealed greater than 100,000 colonies of E. coli sensitive to ceftriaxone, Augmentin, Bactrim, but resistant to ciprofloxacin and Macrobid.  Ultrasound of the kidney unremarkable renal ultrasound.   ASSESSMENT AND PLAN:  An 81 year old female diagnosed with urinary tract infection, could not tolerate nitrofurantoin in view of palpitations and failed on outpatient ciprofloxacin, is sent over to the hospital for intravenous antibiotics by primary care  physician, will be admitted with the following assessment and plan.  1.  Acute Escherichia coli cystitis as per the urine culture.  We will admit her to Med-Surg floor.  We will provide her intravenous Rocephin.  We will provide her intravenous fluids.  2.  Acute kidney injury, probably from poor by mouth intake, could be prerenal.  We will provide intravenous fluids and follow up on the daily labs.  3.  History of leukemia/anemia and chronic thalassemia.  The patient gets Procrit shots at [facility] every other week.  4.  History of hypertension.  Resume her home medications Coreg.  5.  Hyperlipidemia and coronary artery disease.  Continue home medication.  6.  Diabetes mellitus, on diabetic diet and sliding scale insulin.  7.  We will provide gastrointestinal and deep vein thrombosis prophylaxis.  8.  CODE STATUS:  SHE IS FULL CODE.  Spouse is the medical power of attorney.   Plan of care discussed with the patient and her husband at bedside.  They both verbalized understanding of the plan.  Transfer the patient to Dr. Elijio Miles in a.m.   Total time spent on the admission is 45 minutes.    ____________________________ [name], MD ag:ea D: 11/24/2013 00:15:00 ET T:  11/24/2013 00:37:50 ET JOB#: [id number]  cc: [name], MD, <Dictator> [name] MD ELECTRONICALLY SIGNED 11/30/2013 4:43

## 2014-12-15 NOTE — Discharge Summary (Signed)
PATIENT NAME:  Stephanie Harris, Stephanie Harris MR#:  [id number] DATE OF BIRTH:  Aug 03, 1931  DATE OF ADMISSION:  11/23/2013 DATE OF DISCHARGE:  11/25/2013  ADMITTING PHYSICIAN:  [name], MD  DISCHARGING PHYSICIAN:  Gladstone Lighter, MD  PRIMARY CARE PHYSICIAN: [name], MD  [place]: None.   DISCHARGE DIAGNOSES: 1.  Recurrent urinary tract infection.  2.  Hypertension.  3.  Hyperlipidemia.  4.  Coronary artery disease, status post stents.  5.  Diabetes mellitus.  6.  Anemia of chronic disease, on Procrit shots.   7.  Calcemia.   8.  History of chronic lymphocytic leukemia. 9.  History of carotid artery stenosis, status post endarterectomy. 10.  Chronic kidney disease.    DISCHARGE HOME MEDICATIONS:  1.  Carvedilol 25 mg p.o. b.i.d.  2.  Synthroid 50 mcg p.o. daily.  3.  Amitriptyline 20 mg daily at bedtime.  4.  Tramadol 100 mg at bedtime.   5.  Doxycycline 100 mg capsule twice a day for 5 more days.  6.  Pyridium 100 p.o. b.i.d. as needed for bladder spasm.    DISCHARGE DIET: Low sodium diet.   DISCHARGE ACTIVITY: As tolerated.    FOLLOWUP INSTRUCTIONS:  PCP followup in 1 week.   LABORATORY, DIAGNOSTIC AND RADIOLOGICAL DATA PRIOR TO DISCHARGE: 1.  WBC 6.3, hemoglobin 9.6, hematocrit 30.8, platelet count is 109.  2.  Sodium 138, potassium 3.6, chloride 105, bicarb 25, BUN 18, creatinine 1.3, glucose 98 and calcium of 8.9.  3.  Urinalysis with 3+ leuk esterase, 3+ bacteria and 173 WBCs.  4.  Renal ultrasound showing normal kidneys.   5.  Urine culture is growing only 50,000 colonies of E. coli.    BRIEF HOSPITAL COURSE: Ms. Schleifer is a very pleasant 81 year old Caucasian female with past medical history significant for leukemia, thalassemia and anemia for which she follows up at [facility], hypertension, heart disease, who has been having dysuria and bladder spasms for almost 2 weeks now.   Recurrent urinary tract infection. The patient tried nitrofurantoin as  an outpatient and it caused severe palpitations so she could not tolerate it and she could not also tolerate ciprofloxacin so she was sent over to the hospital with her UTI symptoms by her PCP. She was started on Rocephin and doxycycline. She has not had any further fevers. Her urine output has improved and her pain has improved as well so she is being discharged on doxycycline and also Pyridium p.r.n. for bladder spasms. Her cultures were growing only 50,000 colonies of E. coli. This is probably from her outpatient use of antibiotics.    Her course has been otherwise uneventful in the hospital. All her other home medications are being continued without any changes.   DISCHARGE CONDITION: Stable.   DISCHARGE DISPOSITION: Home.   TIME SPENT ON DISCHARGE: 40 minutes.   ____________________________ Gladstone Lighter, MD rk:cs D: 11/26/2013 11:56:30 ET T: 11/26/2013 18:10:00 ET JOB#: [id number]  cc: Gladstone Lighter, MD, <Dictator> [name], MD Gladstone Lighter MD ELECTRONICALLY SIGNED 11/30/2013 14:07

## 2014-12-17 DIAGNOSIS — R262 Difficulty in walking, not elsewhere classified: Secondary | ICD-10-CM | POA: Diagnosis not present

## 2014-12-17 DIAGNOSIS — M6281 Muscle weakness (generalized): Secondary | ICD-10-CM | POA: Diagnosis not present

## 2014-12-19 DIAGNOSIS — R262 Difficulty in walking, not elsewhere classified: Secondary | ICD-10-CM | POA: Diagnosis not present

## 2014-12-19 DIAGNOSIS — M6281 Muscle weakness (generalized): Secondary | ICD-10-CM | POA: Diagnosis not present

## 2014-12-21 DIAGNOSIS — D631 Anemia in chronic kidney disease: Secondary | ICD-10-CM | POA: Diagnosis not present

## 2014-12-21 DIAGNOSIS — I129 Hypertensive chronic kidney disease with stage 1 through stage 4 chronic kidney disease, or unspecified chronic kidney disease: Secondary | ICD-10-CM | POA: Diagnosis not present

## 2014-12-21 DIAGNOSIS — N189 Chronic kidney disease, unspecified: Secondary | ICD-10-CM | POA: Diagnosis not present

## 2014-12-24 ENCOUNTER — Encounter: Payer: Self-pay | Admitting: Physical Therapy

## 2014-12-24 ENCOUNTER — Ambulatory Visit: Payer: Medicare Other | Attending: Internal Medicine | Admitting: Physical Therapy

## 2014-12-24 DIAGNOSIS — M13849 Other specified arthritis, unspecified hand: Secondary | ICD-10-CM | POA: Insufficient documentation

## 2014-12-24 DIAGNOSIS — R531 Weakness: Secondary | ICD-10-CM | POA: Insufficient documentation

## 2014-12-24 DIAGNOSIS — R262 Difficulty in walking, not elsewhere classified: Secondary | ICD-10-CM | POA: Insufficient documentation

## 2014-12-24 DIAGNOSIS — M6281 Muscle weakness (generalized): Secondary | ICD-10-CM | POA: Diagnosis not present

## 2014-12-24 NOTE — Therapy (Signed)
[place] MAIN [facility] SERVICES [address] [place], [place], [address] Phone: [phone]   Fax:  [phone]  Physical Therapy Treatment  Patient Details  Name: SERINE KEA MRN: [id number] Date of Birth: July 11, 1931 Referring Provider:  Allyne Gee, MD  Encounter Date: 12/24/2014      PT End of Session - 12/24/14 1405    Visit Number 11   Number of Visits 16   Date for PT Re-Evaluation 01/14/15   Authorization Type 1 out of 10 for g codes   PT Start Time 1300   PT Stop Time 1345   PT Time Calculation (min) 45 min   Equipment Utilized During Treatment Gait belt   Activity Tolerance Patient tolerated treatment well   Behavior During Therapy [facility] for tasks assessed/performed      Past Medical History  Diagnosis Date  . Hypertension   . Stroke   . RA (rheumatoid arthritis)   . Heart disease     Past Surgical History  Procedure Laterality Date  . Abdominal hysterectomy    . Cholecystectomy      There were no vitals filed for this visit.  Visit Diagnosis:  Difficulty walking      Subjective Assessment - 12/24/14 1313    Subjective Patient is feeling very tired and it took her 3 hours to get ready.             [facility] PT Assessment - 12/24/14 0001    Assessment   Medical Diagnosis arm weakness and problem with balance   Precautions   Precautions Fall   Functional Tests   Functional tests Sit to Stand  5x sit to stand was 2.06 min at eval; today is 55.5sec   Sit to Stand   Comments >14 indicates a balance dysfunction   Ambulation/Gait   Gait velocity 11mw was .24 at eval; today is .5m/sec (1.46m/sec is normal time to cross a street)   6 Minute Walk- Baseline   6 Minute Walk- Baseline yes  360' on eval; today is 69'  (1386' is norm for 81yo female)   Standardized Balance Assessment   Standardized Balance Assessment --  was 57.01 at eval; today is- 38.30sec (>14 sec=fall risk)                     [facility] Adult PT Treatment/Exercise - 12/24/14 0001    Knee/Hip Exercises: Aerobic   Stationary Bike Nustep 64min; level1   Knee/Hip Exercises: Machines for Strengthening   Cybex Leg Press 60lbs.  20 reps x 2 sets  30 lbs PF B ankle x 20 x 2                PT Education - 12/24/14 1404    Education provided Yes   Person(s) Educated Patient   Methods Verbal cues   Comprehension Returned demonstration          PT Short Term Goals - 12/24/14 1337    PT SHORT TERM GOAL #1   Title Patient wil be independent with home exercise program for decreased fall risk and improve safety with functional activities in 8 weeks from 3.28.16 by 01/14/15   Status On-going   PT SHORT TERM GOAL #2   Title Patient will improve gait speed to >.54m/s with LRAD for increased safety with community ambulation in 4 weeks. from3/28/16 by 12/17/14   Status On-going           PT Long Term Goals - 12/24/14  [place] #1   Title Patient will complete a TUG test in <12 seconds without an AD for independent mobility and decreased fall risk in 8 weeks.  by 01/14/15   Status On-going   PT LONG TERM GOAL #2   Title Patient (>46 years old) will complete 5x/sit to stand test in < 14.2 seconds indicating a decreased likelihood of a balance dysfunction and improved LE strength in 8 weeks.  By 01/14/15   Status On-going   PT LONG TERM GOAL #3   Title Patient will be independent with ambulation with LRAD on unlevel surfaces without LOB for decreased fall risk in 4 weeks by 12/17/14   Status On-going               Plan - 12/25/2014 1410    Clinical Impression Statement Patient has improved in all outcome measures and has decreased her falls risk. She will continue to benefit from skilled PT to improve mobility and strength.    Pt will benefit from skilled therapeutic intervention in order to improve on the following deficits Difficulty walking;Decreased balance;Decreased strength   Rehab  Potential Good   PT Frequency 2x / week   PT Duration 3 weeks   PT Treatment/Interventions Gait training;Therapeutic activities;Therapeutic exercise;Balance training;Patient/family education;Neuromuscular re-education   PT Next Visit Plan Continue to improve balance and strengthening to reduce falls risk.    Consulted and Agree with Plan of Care Patient          G-Codes - Dec 25, 2014 1415    Functional Assessment Tool Used TUG, 10 MW, 6 MW, 5 x sit to stand   Mobility: Walking and Moving Around Current Status [phone]) At least 60 percent but less than 80 percent impaired, limited or restricted   Mobility: Walking and Moving Around Goal Status [phone]) At least 40 percent but less than 60 percent impaired, limited or restricted      Problem List Patient Active Problem List   Diagnosis Date Noted  . Allergic arthritis, hand     Alanson Puls 12/25/14, 2:26 PM  [place] MAIN [facility] SERVICES [address] [place], [place], [address] Phone: [phone]   Fax:  [phone]

## 2014-12-26 ENCOUNTER — Encounter: Payer: Self-pay | Admitting: Physical Therapy

## 2014-12-26 ENCOUNTER — Ambulatory Visit: Payer: Medicare Other | Admitting: Physical Therapy

## 2014-12-26 DIAGNOSIS — M6281 Muscle weakness (generalized): Secondary | ICD-10-CM | POA: Diagnosis not present

## 2014-12-26 DIAGNOSIS — R531 Weakness: Secondary | ICD-10-CM | POA: Diagnosis not present

## 2014-12-26 DIAGNOSIS — R262 Difficulty in walking, not elsewhere classified: Secondary | ICD-10-CM

## 2014-12-26 NOTE — Therapy (Signed)
[place] MAIN [facility] SERVICES [address] [place], [place], [address] Phone: [phone]   Fax:  [phone]  Physical Therapy Treatment  Patient Details  Name: Stephanie Harris MRN: [id number] Date of Birth: 1931/01/28 Referring Provider:  Allyne Gee, MD  Encounter Date: 12/26/2014      PT End of Session - 12/26/14 1611    Visit Number 10   Number of Visits 16   Date for PT Re-Evaluation 01/14/15   PT Start Time 5945   PT Stop Time 1600   PT Time Calculation (min) 45 min   Equipment Utilized During Treatment Gait belt   Activity Tolerance Patient limited by fatigue   Behavior During Therapy [facility] for tasks assessed/performed      Past Medical History  Diagnosis Date  . Hypertension   . Stroke   . RA (rheumatoid arthritis)   . Heart disease     Past Surgical History  Procedure Laterality Date  . Abdominal hysterectomy    . Cholecystectomy      There were no vitals filed for this visit.  Visit Diagnosis:  Difficulty walking      Subjective Assessment - 12/26/14 1516    Subjective Patient is very tired. She sees the neurologist tomorrow.       Therapeutic exercise including: standing hip abd with YTB x 20  side stepping left and right in parallel bars 10 feet x 3 standing on blue foam with cone reaching x 20 across midline step ups from floor to 6 inch stool x 20 bilateral sit to stand x 10 marching in parallel bars x 20 stepping pattern with weight shifting fwd/bwd x 10.                           PT Education - 12/26/14 1517    Education provided Yes   Person(s) Educated Patient   Methods Demonstration;Explanation   Comprehension Returned demonstration;Verbalized understanding          PT Short Term Goals - 12/24/14 1337    PT SHORT TERM GOAL #1   Title Patient wil be independent with home exercise program for decreased fall risk and improve safety with functional activities in 8  weeks from 3.28.16 by 01/14/15   Status On-going   PT SHORT TERM GOAL #2   Title Patient will improve gait speed to >.75m/s with LRAD for increased safety with community ambulation in 4 weeks. from3/28/16 by 12/17/14   Status On-going           PT Long Term Goals - 12/24/14 1340    PT LONG TERM GOAL #1   Title Patient will complete a TUG test in <12 seconds without an AD for independent mobility and decreased fall risk in 8 weeks.  by 01/14/15   Status On-going   PT LONG TERM GOAL #2   Title Patient (>54 years old) will complete 5x/sit to stand test in < 14.2 seconds indicating a decreased likelihood of a balance dysfunction and improved LE strength in 8 weeks.  By 01/14/15   Status On-going   PT LONG TERM GOAL #3   Title Patient will be independent with ambulation with LRAD on unlevel surfaces without LOB for decreased fall risk in 4 weeks by 12/17/14   Status On-going               Plan - 12/26/14 1613    Clinical Impression Statement Improving joint and soft  tissue mobility Fatigue with sit to stand but demonstrating more control, Increase weight for standing exercises.   Pt will benefit from skilled therapeutic intervention in order to improve on the following deficits Difficulty walking;Decreased balance;Decreased strength   Rehab Potential Good   Consulted and Agree with Plan of Care Patient        Problem List Patient Active Problem List   Diagnosis Date Noted  . Allergic arthritis, hand     Alanson Puls, PT, DPT  12/26/2014, 4:34 PM  [place] MAIN [facility] SERVICES [address] [place], [place], [address] Phone: [phone]   Fax:  [phone]

## 2014-12-27 DIAGNOSIS — R269 Unspecified abnormalities of gait and mobility: Secondary | ICD-10-CM | POA: Diagnosis not present

## 2014-12-31 ENCOUNTER — Telehealth: Payer: Self-pay | Admitting: Physical Therapy

## 2014-12-31 ENCOUNTER — Ambulatory Visit: Payer: Medicare Other | Admitting: Physical Therapy

## 2014-12-31 ENCOUNTER — Encounter: Payer: Self-pay | Admitting: Physical Therapy

## 2014-12-31 DIAGNOSIS — R262 Difficulty in walking, not elsewhere classified: Secondary | ICD-10-CM

## 2014-12-31 DIAGNOSIS — M6281 Muscle weakness (generalized): Secondary | ICD-10-CM | POA: Diagnosis not present

## 2014-12-31 DIAGNOSIS — R531 Weakness: Secondary | ICD-10-CM | POA: Diagnosis not present

## 2014-12-31 NOTE — Telephone Encounter (Signed)
na

## 2014-12-31 NOTE — Therapy (Signed)
[place] MAIN [facility] SERVICES [address] [place], [place], [address] Phone: [phone]   Fax:  [phone]  Physical Therapy Treatment  Patient Details  Name: Stephanie Harris MRN: [id number] Date of Birth: 12/28/1930 Referring Provider:  Allyne Gee, MD  Encounter Date: 12/31/2014      PT End of Session - 12/31/14 1419    Visit Number 11   Number of Visits 16   Date for PT Re-Evaluation 01/14/15   PT Start Time 6811   PT Stop Time 1500   PT Time Calculation (min) 45 min   Equipment Utilized During Treatment Gait belt   Activity Tolerance Patient limited by fatigue   Behavior During Therapy [facility] for tasks assessed/performed      Past Medical History  Diagnosis Date  . Hypertension   . Stroke   . RA (rheumatoid arthritis)   . Heart disease     Past Surgical History  Procedure Laterality Date  . Abdominal hysterectomy    . Cholecystectomy      There were no vitals filed for this visit.  Visit Diagnosis:  Difficulty walking      Subjective Assessment - 12/31/14 1413    Subjective Patient agrees to having students observe today . She explains her medical history to them.         Therapeutic exercise including: standing hip abd with YTB x 20  side stepping left and right in parallel bars 10 feet x 3 standing on blue foam and tapping to steps step ups from floor to 6 inch stool x 20 bilateral sit to stand x 10 marching in parallel bars x 20 stepping pattern with weight shifting fwd/bwd x 10. Leg press 60 lbs x 20 x 3.                          PT Education - 12/31/14 1414    Person(s) Educated Patient   Methods Explanation   Comprehension Returned demonstration;Verbalized understanding          PT Short Term Goals - 12/24/14 1337    PT SHORT TERM GOAL #1   Title Patient wil be independent with home exercise program for decreased fall risk and improve safety with functional activities in  8 weeks from 3.28.16 by 01/14/15   Status On-going   PT SHORT TERM GOAL #2   Title Patient will improve gait speed to >.2m/s with LRAD for increased safety with community ambulation in 4 weeks. from3/28/16 by 12/17/14   Status On-going           PT Long Term Goals - 12/24/14 1340    PT LONG TERM GOAL #1   Title Patient will complete a TUG test in <12 seconds without an AD for independent mobility and decreased fall risk in 8 weeks.  by 01/14/15   Status On-going   PT LONG TERM GOAL #2   Title Patient (>79 years old) will complete 5x/sit to stand test in < 14.2 seconds indicating a decreased likelihood of a balance dysfunction and improved LE strength in 8 weeks.  By 01/14/15   Status On-going   PT LONG TERM GOAL #3   Title Patient will be independent with ambulation with LRAD on unlevel surfaces without LOB for decreased fall risk in 4 weeks by 12/17/14   Status On-going               Plan - 12/31/14 1516  Clinical Impression Statement Patient has slowness of movement during rotation and beginning movements. Patient loses her balance backwards and has poor foot placement with single leg stepping activities, and toe catching step during ascending or descending toe tapping        Problem List Patient Active Problem List   Diagnosis Date Noted  . Allergic arthritis, hand     Alanson Puls 12/31/2014, 3:18 PM  [place] MAIN [facility] SERVICES [address] [place], [place], [address] Phone: [phone]   Fax:  [phone]

## 2014-12-31 NOTE — Telephone Encounter (Signed)
Called patient to get permission to have [employer] students observe her treatment today and she said that this would be fine.

## 2015-01-02 ENCOUNTER — Ambulatory Visit: Payer: Medicare Other | Admitting: Physical Therapy

## 2015-01-02 ENCOUNTER — Encounter: Payer: Self-pay | Admitting: Physical Therapy

## 2015-01-02 DIAGNOSIS — M6281 Muscle weakness (generalized): Secondary | ICD-10-CM | POA: Diagnosis not present

## 2015-01-02 DIAGNOSIS — R262 Difficulty in walking, not elsewhere classified: Secondary | ICD-10-CM

## 2015-01-02 DIAGNOSIS — R531 Weakness: Secondary | ICD-10-CM | POA: Diagnosis not present

## 2015-01-02 NOTE — Therapy (Signed)
[place] MAIN [facility] SERVICES [address] [place], [place], [address] Phone: [phone]   Fax:  [phone]  Physical Therapy Treatment  Patient Details  Name: Stephanie Harris MRN: [id number] Date of Birth: October 22, 1930 Referring Provider:  Allyne Gee, MD  Encounter Date: 01/02/2015      PT End of Session - 01/02/15 1423    Visit Number 12   Number of Visits 16   Date for PT Re-Evaluation 01/14/15   PT Start Time 9244   PT Stop Time 1500   PT Time Calculation (min) 45 min   Equipment Utilized During Treatment Gait belt   Activity Tolerance Patient limited by fatigue   Behavior During Therapy [facility] for tasks assessed/performed      Past Medical History  Diagnosis Date  . Hypertension   . Stroke   . RA (rheumatoid arthritis)   . Heart disease     Past Surgical History  Procedure Laterality Date  . Abdominal hysterectomy    . Cholecystectomy      There were no vitals filed for this visit.  Visit Diagnosis:  Difficulty walking      Subjective Assessment - 01/02/15 1421    Subjective Patient is feeling fatigued and found out from her MD that she can't go to her daughters because it is too far.          Therapeutic exercise including: Seated hip flex x 10 x 2, knee flex with 2 lbs x 20 x 2 standing hip abd with YTB x 20  side stepping left and right in parallel bars 10 feet x 3 standing on blue foam and tapping to steps step ups from floor to 6 inch stool x 20 bilateral sit to stand x 10 marching in parallel bars x 20 stepping pattern with weight shifting fwd/bwd x 10.                           PT Education - 01/02/15 1422    Education provided Yes   Person(s) Educated Patient   Methods Explanation;Demonstration   Comprehension Verbalized understanding;Returned demonstration;Verbal cues required          PT Short Term Goals - 12/24/14 1337    PT SHORT TERM GOAL #1   Title Patient wil  be independent with home exercise program for decreased fall risk and improve safety with functional activities in 8 weeks from 3.28.16 by 01/14/15   Status On-going   PT SHORT TERM GOAL #2   Title Patient will improve gait speed to >.84m/s with LRAD for increased safety with community ambulation in 4 weeks. from3/28/16 by 12/17/14   Status On-going           PT Long Term Goals - 12/24/14 1340    PT LONG TERM GOAL #1   Title Patient will complete a TUG test in <12 seconds without an AD for independent mobility and decreased fall risk in 8 weeks.  by 01/14/15   Status On-going   PT LONG TERM GOAL #2   Title Patient (>52 years old) will complete 5x/sit to stand test in < 14.2 seconds indicating a decreased likelihood of a balance dysfunction and improved LE strength in 8 weeks.  By 01/14/15   Status On-going   PT LONG TERM GOAL #3   Title Patient will be independent with ambulation with LRAD on unlevel surfaces without LOB for decreased fall risk in 4 weeks by 12/17/14  Status On-going               Plan - 01/02/15 1424    Clinical Impression Statement Patient performs intermediate level exercises without pain behaviors and needs verbal cuing for postural alignment and head positioning   Pt will benefit from skilled therapeutic intervention in order to improve on the following deficits Difficulty walking;Decreased balance;Decreased strength;Decreased mobility   Rehab Potential Good        Problem List Patient Active Problem List   Diagnosis Date Noted  . Allergic arthritis, hand     Alanson Puls 01/02/2015, 2:27 PM  [place] MAIN [facility] SERVICES [address] [place], [place], [address] Phone: [phone]   Fax:  [phone]

## 2015-01-03 DIAGNOSIS — C91Z0 Other lymphoid leukemia not having achieved remission: Secondary | ICD-10-CM | POA: Diagnosis not present

## 2015-01-03 DIAGNOSIS — D638 Anemia in other chronic diseases classified elsewhere: Secondary | ICD-10-CM | POA: Diagnosis not present

## 2015-01-07 ENCOUNTER — Ambulatory Visit: Payer: Medicare Other | Admitting: Physical Therapy

## 2015-01-07 ENCOUNTER — Encounter: Payer: Self-pay | Admitting: Physical Therapy

## 2015-01-07 DIAGNOSIS — R262 Difficulty in walking, not elsewhere classified: Secondary | ICD-10-CM | POA: Diagnosis not present

## 2015-01-07 DIAGNOSIS — M6281 Muscle weakness (generalized): Secondary | ICD-10-CM | POA: Diagnosis not present

## 2015-01-07 DIAGNOSIS — R531 Weakness: Secondary | ICD-10-CM | POA: Diagnosis not present

## 2015-01-07 NOTE — Therapy (Signed)
[place] MAIN [facility] SERVICES [address] [place], [place], [address] Phone: [phone]   Fax:  [phone]  Physical Therapy Treatment  Patient Details  Name: Stephanie Harris MRN: [id number] Date of Birth: [birth year] Referring Provider:  Allyne Gee, MD  Encounter Date: 01/07/2015      PT End of Session - 01/07/15 1727    Visit Number 13   Number of Visits 16   Date for PT Re-Evaluation 01/14/15   Equipment Utilized During Treatment Gait belt   Activity Tolerance Patient limited by fatigue   Behavior During Therapy [facility] for tasks assessed/performed      Past Medical History  Diagnosis Date  . Hypertension   . Stroke   . RA (rheumatoid arthritis)   . Heart disease     Past Surgical History  Procedure Laterality Date  . Abdominal hysterectomy    . Cholecystectomy      There were no vitals filed for this visit.  Visit Diagnosis:  Difficulty walking      Subjective Assessment - 01/07/15 1723    Subjective Patient says that it took her all day to get dressed today becausse she is so fatigued.       Therapeutic exercise including: standing hip abd with YTB x 20  side stepping left and right in parallel bars 10 feet x 3 standing on blue foam and tapping to steps sit to stand x 10 marching in parallel bars x 20 stepping pattern with weight shifting fwd/bwd x 10.                           PT Education - 01/07/15 1723    Education provided Yes   Person(s) Educated Patient   Methods Explanation;Demonstration   Comprehension Verbalized understanding          PT Short Term Goals - 12/24/14 1337    PT SHORT TERM GOAL #1   Title Patient wil be independent with home exercise program for decreased fall risk and improve safety with functional activities in 8 weeks from 3.28.16 by 01/14/15   Status On-going   PT SHORT TERM GOAL #2   Title Patient will improve gait speed to >.92m/s with LRAD for  increased safety with community ambulation in 4 weeks. from3/28/16 by 12/17/14   Status On-going           PT Long Term Goals - 12/24/14 1340    PT LONG TERM GOAL #1   Title Patient will complete a TUG test in <12 seconds without an AD for independent mobility and decreased fall risk in 8 weeks.  by 01/14/15   Status On-going   PT LONG TERM GOAL #2   Title Patient (>27 years old) will complete 5x/sit to stand test in < 14.2 seconds indicating a decreased likelihood of a balance dysfunction and improved LE strength in 8 weeks.  By 01/14/15   Status On-going   PT LONG TERM GOAL #3   Title Patient will be independent with ambulation with LRAD on unlevel surfaces without LOB for decreased fall risk in 4 weeks by 12/17/14   Status On-going               Plan - 01/07/15 1728    Clinical Impression Statement Patient advancing to red theraband for exercises listed above.Patient demonstrating more control with squat exercise.Patient will continue to benefit from skilled PT to improve strength and mobility.  Problem List Patient Active Problem List   Diagnosis Date Noted  . Allergic arthritis, hand     Alanson Puls 01/07/2015, 5:32 PM  [place] MAIN [facility] SERVICES [address] [place], [place], [address] Phone: [phone]   Fax:  [phone]

## 2015-01-09 ENCOUNTER — Encounter: Payer: Self-pay | Admitting: Physical Therapy

## 2015-01-09 ENCOUNTER — Ambulatory Visit: Payer: Medicare Other | Admitting: Physical Therapy

## 2015-01-09 DIAGNOSIS — R531 Weakness: Secondary | ICD-10-CM | POA: Diagnosis not present

## 2015-01-09 DIAGNOSIS — R262 Difficulty in walking, not elsewhere classified: Secondary | ICD-10-CM | POA: Diagnosis not present

## 2015-01-09 DIAGNOSIS — M6281 Muscle weakness (generalized): Secondary | ICD-10-CM | POA: Diagnosis not present

## 2015-01-09 NOTE — Therapy (Signed)
[place] MAIN [facility] SERVICES [address] [place], [place], [address] Phone: [phone]   Fax:  [phone]  Physical Therapy Treatment  Patient Details  Name: Stephanie Harris MRN: [id number] Date of Birth: 1930/10/29 Referring Provider:  Allyne Gee, MD  Encounter Date: 01/09/2015      PT End of Session - 01/09/15 1622    Visit Number 14   Number of Visits 16   Date for PT Re-Evaluation 01/14/15   PT Start Time 0400   PT Stop Time 0445   PT Time Calculation (min) 45 min   Equipment Utilized During Treatment Gait belt   Activity Tolerance Patient limited by fatigue   Behavior During Therapy [facility] for tasks assessed/performed      Past Medical History  Diagnosis Date  . Hypertension   . Stroke   . RA (rheumatoid arthritis)   . Heart disease     Past Surgical History  Procedure Laterality Date  . Abdominal hysterectomy    . Cholecystectomy      There were no vitals filed for this visit.  Visit Diagnosis:  Difficulty walking      Subjective Assessment - 01/09/15 1621    Subjective Im cold today.              Therapeutic exercise including: standing hip abd with YTB x 20  side stepping left and right in parallel bars 10 feet x 3 standing on blue foam and tapping to steps sit to stand x 10 marching in parallel bars x 20 stepping pattern with weight shifting fwd/bwd x 10.                                PT Education - 01/09/15 1621    Education provided Yes   Person(s) Educated Patient   Methods Explanation;Demonstration   Comprehension Verbalized understanding;Returned demonstration;Verbal cues required          PT Short Term Goals - 12/24/14 1337    PT SHORT TERM GOAL #1   Title Patient wil be independent with home exercise program for decreased fall risk and improve safety with functional activities in 8 weeks from 3.28.16 by 01/14/15   Status On-going   PT SHORT TERM GOAL #2    Title Patient will improve gait speed to >.61m/s with LRAD for increased safety with community ambulation in 4 weeks. from3/28/16 by 12/17/14   Status On-going           PT Long Term Goals - 12/24/14 1340    PT LONG TERM GOAL #1   Title Patient will complete a TUG test in <12 seconds without an AD for independent mobility and decreased fall risk in 8 weeks.  by 01/14/15   Status On-going   PT LONG TERM GOAL #2   Title Patient (>92 years old) will complete 5x/sit to stand test in < 14.2 seconds indicating a decreased likelihood of a balance dysfunction and improved LE strength in 8 weeks.  By 01/14/15   Status On-going   PT LONG TERM GOAL #3   Title Patient will be independent with ambulation with LRAD on unlevel surfaces without LOB for decreased fall risk in 4 weeks by 12/17/14   Status On-going               Plan - 01/09/15 1622    Clinical Impression Statement  Fatigue with sit to stand but demonstrating more control,  Increase weight for standing exercises.Verbal cues needed to keep hips aligned with abduction   Pt will benefit from skilled therapeutic intervention in order to improve on the following deficits Difficulty walking;Decreased balance;Decreased strength;Decreased mobility   Rehab Potential Good   PT Frequency 2x / week   PT Duration 3 weeks   PT Treatment/Interventions Gait training;Therapeutic activities;Therapeutic exercise;Balance training;Patient/family education;Neuromuscular re-education   PT Next Visit Plan Continue to improve balance and strengthening to reduce falls risk.    Consulted and Agree with Plan of Care Patient        Problem List Patient Active Problem List   Diagnosis Date Noted  . Allergic arthritis, hand     Alanson Puls 01/09/2015, 4:25 PM  [place] MAIN [facility] SERVICES [address] [place], [place], [address] Phone: [phone]   Fax:  [phone]

## 2015-01-14 ENCOUNTER — Ambulatory Visit: Payer: Medicare Other | Admitting: Physical Therapy

## 2015-01-14 ENCOUNTER — Encounter: Payer: Self-pay | Admitting: Physical Therapy

## 2015-01-14 DIAGNOSIS — R262 Difficulty in walking, not elsewhere classified: Secondary | ICD-10-CM | POA: Diagnosis not present

## 2015-01-14 DIAGNOSIS — M6281 Muscle weakness (generalized): Secondary | ICD-10-CM | POA: Diagnosis not present

## 2015-01-14 DIAGNOSIS — R531 Weakness: Secondary | ICD-10-CM | POA: Diagnosis not present

## 2015-01-14 NOTE — Therapy (Signed)
[place] MAIN [facility] SERVICES [address] [place], [place], [address] Phone: [phone]   Fax:  [phone]  Physical Therapy Treatment  Patient Details  Name: Stephanie Harris MRN: [id number] Date of Birth: 08-19-1931 Referring Provider:  Allyne Gee, MD  Encounter Date: 01/14/2015      PT End of Session - 01/14/15 1717    Visit Number 15   Number of Visits 16   Date for PT Re-Evaluation 01/14/15   Equipment Utilized During Treatment Gait belt   Activity Tolerance Patient limited by fatigue   Behavior During Therapy [facility] for tasks assessed/performed      Past Medical History  Diagnosis Date  . Hypertension   . Stroke   . RA (rheumatoid arthritis)   . Heart disease     Past Surgical History  Procedure Laterality Date  . Abdominal hysterectomy    . Cholecystectomy      There were no vitals filed for this visit.  Visit Diagnosis:  Difficulty walking      Subjective Assessment - 01/14/15 1716    Subjective Patient says that it took her from 12:00 noon until now to get ready to come to therapy.       Therapeutic activities including: Standing balance training with foam and feet together and balloon tapping, toe tapping left and right on 6 inch stool. Walking in parallel bars with SBA 10 feet x 6 Squats and LE 4 way with YTB x 10 x 2 BLE Side stepping in parallel bars with SBA. Fatigue with sit to stand but demonstrating more control, Increase weight for standing exercises                          PT Education - 01/14/15 1717    Education provided Yes   Person(s) Educated Patient   Methods Explanation;Demonstration;Tactile cues   Comprehension Verbalized understanding;Returned demonstration;Verbal cues required          PT Short Term Goals - 12/24/14 1337    PT SHORT TERM GOAL #1   Title Patient wil be independent with home exercise program for decreased fall risk and improve safety with  functional activities in 8 weeks from 3.28.16 by 01/14/15   Status On-going   PT SHORT TERM GOAL #2   Title Patient will improve gait speed to >.27m/s with LRAD for increased safety with community ambulation in 4 weeks. from3/28/16 by 12/17/14   Status On-going           PT Long Term Goals - 12/24/14 1340    PT LONG TERM GOAL #1   Title Patient will complete a TUG test in <12 seconds without an AD for independent mobility and decreased fall risk in 8 weeks.  by 01/14/15   Status On-going   PT LONG TERM GOAL #2   Title Patient (81 years old) will complete 5x/sit to stand test in < 14.2 seconds indicating a decreased likelihood of a balance dysfunction and improved LE strength in 8 weeks.  By 01/14/15   Status On-going   PT LONG TERM GOAL #3   Title Patient will be independent with ambulation with LRAD on unlevel surfaces without LOB for decreased fall risk in 4 weeks by 12/17/14   Status On-going               Plan - 01/14/15 1717    Clinical Impression Statement Tactile cues and assistance needed to keep lower leg and knee  in neutral to avoid compensations with ankle motions   Pt will benefit from skilled therapeutic intervention in order to improve on the following deficits Difficulty walking;Decreased balance;Decreased strength;Decreased mobility   Rehab Potential Good   PT Frequency 2x / week   PT Duration 3 weeks   PT Treatment/Interventions Gait training;Therapeutic activities;Therapeutic exercise;Balance training;Patient/family education;Neuromuscular re-education   PT Next Visit Plan Continue to improve balance and strengthening to reduce falls risk.    Consulted and Agree with Plan of Care Patient        Problem List Patient Active Problem List   Diagnosis Date Noted  . Allergic arthritis, hand     Alanson Puls 01/14/2015, 5:20 PM  [place] MAIN [facility] SERVICES [address] [place], [place], [address] Phone:  [phone]   Fax:  [phone]

## 2015-01-16 ENCOUNTER — Encounter: Payer: Self-pay | Admitting: Physical Therapy

## 2015-01-17 DIAGNOSIS — D638 Anemia in other chronic diseases classified elsewhere: Secondary | ICD-10-CM | POA: Diagnosis not present

## 2015-01-23 ENCOUNTER — Ambulatory Visit: Payer: Medicare Other | Admitting: Physical Therapy

## 2015-01-24 ENCOUNTER — Encounter: Payer: Self-pay | Admitting: Physical Therapy

## 2015-01-24 NOTE — Therapy (Signed)
[place] MAIN [facility] SERVICES [address] [place], [place], [address] Phone: [phone]   Fax:  [phone]  Patient Details  Name: Stephanie Harris MRN: [id number] Date of Birth: [birth year] Referring Provider:  No ref. provider found  Encounter Date: 01/24/2015      PT Long Term Goals - 12/24/14 1340    PT LONG TERM GOAL #1   Title Patient will complete a TUG test in <12 seconds without an AD for independent mobility and decreased fall risk in 8 weeks.  by 01/14/15   Status On-going   PT LONG TERM GOAL #2   Title Patient (>69 years old) will complete 5x/sit to stand test in < 14.2 seconds indicating a decreased likelihood of a balance dysfunction and improved LE strength in 8 weeks.  By 01/14/15   Status On-going   PT LONG TERM GOAL #3   Title Patient will be independent with ambulation with LRAD on unlevel surfaces without LOB for decreased fall risk in 4 weeks by 12/17/14   Status On-going     PHYSICAL THERAPY DISCHARGE SUMMARY  Visits from Start of Care: 15  Current functional level related to goals / functional outcomes: Patient is independent with ambulation with RW but continues to have a falls risk and has weakness all extremities and has fatigue.    Remaining deficits: decreased strength, balance deficits with falls risk and decreased mobility including decreased ambulation with decreased gait speed.    Education / Equipment: HEP instructed Plan: Patient agrees to discharge.  Patient goals were partially met. Patient is being discharged due to the patient's request.  ?????      Arelia Sneddon S 01/24/2015, 11:50 AM Alanson Puls, PT, [place] MAIN [facility] SERVICES [address] [place], [place], [address] Phone: [phone]   Fax:  [phone]

## 2015-01-25 ENCOUNTER — Ambulatory Visit: Payer: Medicare Other | Admitting: Physical Therapy

## 2015-01-28 DIAGNOSIS — E119 Type 2 diabetes mellitus without complications: Secondary | ICD-10-CM | POA: Diagnosis not present

## 2015-01-28 DIAGNOSIS — D599 Acquired hemolytic anemia, unspecified: Secondary | ICD-10-CM | POA: Diagnosis not present

## 2015-01-28 DIAGNOSIS — I10 Essential (primary) hypertension: Secondary | ICD-10-CM | POA: Diagnosis not present

## 2015-01-28 DIAGNOSIS — M6281 Muscle weakness (generalized): Secondary | ICD-10-CM | POA: Diagnosis not present

## 2015-01-28 DIAGNOSIS — C9510 Chronic leukemia of unspecified cell type not having achieved remission: Secondary | ICD-10-CM | POA: Diagnosis not present

## 2015-01-28 DIAGNOSIS — R531 Weakness: Secondary | ICD-10-CM | POA: Diagnosis not present

## 2015-01-29 ENCOUNTER — Emergency Department: Payer: Medicare Other

## 2015-01-29 ENCOUNTER — Observation Stay: Payer: Medicare Other

## 2015-01-29 ENCOUNTER — Encounter: Payer: Self-pay | Admitting: Emergency Medicine

## 2015-01-29 ENCOUNTER — Observation Stay
Admission: EM | Admit: 2015-01-29 | Discharge: 2015-01-30 | Disposition: A | Discharge status: Home / Self Care | Payer: Medicare Other | Attending: Internal Medicine | Admitting: Internal Medicine

## 2015-01-29 DIAGNOSIS — Z9849 Cataract extraction status, unspecified eye: Secondary | ICD-10-CM | POA: Diagnosis not present

## 2015-01-29 DIAGNOSIS — Z96651 Presence of right artificial knee joint: Secondary | ICD-10-CM | POA: Diagnosis not present

## 2015-01-29 DIAGNOSIS — I519 Heart disease, unspecified: Secondary | ICD-10-CM | POA: Diagnosis not present

## 2015-01-29 DIAGNOSIS — G451 Carotid artery syndrome (hemispheric): Secondary | ICD-10-CM

## 2015-01-29 DIAGNOSIS — Z806 Family history of leukemia: Secondary | ICD-10-CM | POA: Insufficient documentation

## 2015-01-29 DIAGNOSIS — R531 Weakness: Secondary | ICD-10-CM | POA: Diagnosis not present

## 2015-01-29 DIAGNOSIS — I69320 Aphasia following cerebral infarction: Secondary | ICD-10-CM | POA: Insufficient documentation

## 2015-01-29 DIAGNOSIS — I639 Cerebral infarction, unspecified: Secondary | ICD-10-CM

## 2015-01-29 DIAGNOSIS — D696 Thrombocytopenia, unspecified: Secondary | ICD-10-CM | POA: Insufficient documentation

## 2015-01-29 DIAGNOSIS — G459 Transient cerebral ischemic attack, unspecified: Principal | ICD-10-CM | POA: Diagnosis present

## 2015-01-29 DIAGNOSIS — M069 Rheumatoid arthritis, unspecified: Secondary | ICD-10-CM | POA: Diagnosis not present

## 2015-01-29 DIAGNOSIS — L03116 Cellulitis of left lower limb: Secondary | ICD-10-CM | POA: Diagnosis not present

## 2015-01-29 DIAGNOSIS — Z90710 Acquired absence of both cervix and uterus: Secondary | ICD-10-CM | POA: Insufficient documentation

## 2015-01-29 DIAGNOSIS — Z8673 Personal history of transient ischemic attack (TIA), and cerebral infarction without residual deficits: Secondary | ICD-10-CM | POA: Insufficient documentation

## 2015-01-29 DIAGNOSIS — I10 Essential (primary) hypertension: Secondary | ICD-10-CM | POA: Insufficient documentation

## 2015-01-29 DIAGNOSIS — G319 Degenerative disease of nervous system, unspecified: Secondary | ICD-10-CM | POA: Diagnosis not present

## 2015-01-29 DIAGNOSIS — I739 Peripheral vascular disease, unspecified: Secondary | ICD-10-CM | POA: Diagnosis not present

## 2015-01-29 DIAGNOSIS — C9210 Chronic myeloid leukemia, BCR/ABL-positive, not having achieved remission: Secondary | ICD-10-CM | POA: Insufficient documentation

## 2015-01-29 DIAGNOSIS — M8580 Other specified disorders of bone density and structure, unspecified site: Secondary | ICD-10-CM | POA: Insufficient documentation

## 2015-01-29 DIAGNOSIS — Z79899 Other long term (current) drug therapy: Secondary | ICD-10-CM | POA: Insufficient documentation

## 2015-01-29 DIAGNOSIS — Z9049 Acquired absence of other specified parts of digestive tract: Secondary | ICD-10-CM | POA: Insufficient documentation

## 2015-01-29 DIAGNOSIS — Z880 Allergy status to penicillin: Secondary | ICD-10-CM | POA: Insufficient documentation

## 2015-01-29 DIAGNOSIS — R200 Anesthesia of skin: Secondary | ICD-10-CM | POA: Diagnosis not present

## 2015-01-29 DIAGNOSIS — R21 Rash and other nonspecific skin eruption: Secondary | ICD-10-CM | POA: Diagnosis not present

## 2015-01-29 DIAGNOSIS — Z7982 Long term (current) use of aspirin: Secondary | ICD-10-CM | POA: Diagnosis not present

## 2015-01-29 DIAGNOSIS — G311 Senile degeneration of brain, not elsewhere classified: Secondary | ICD-10-CM | POA: Diagnosis not present

## 2015-01-29 DIAGNOSIS — Z856 Personal history of leukemia: Secondary | ICD-10-CM | POA: Diagnosis not present

## 2015-01-29 DIAGNOSIS — Z955 Presence of coronary angioplasty implant and graft: Secondary | ICD-10-CM | POA: Insufficient documentation

## 2015-01-29 DIAGNOSIS — R4701 Aphasia: Secondary | ICD-10-CM | POA: Diagnosis not present

## 2015-01-29 HISTORY — DX: Chronic myeloid leukemia, BCR/ABL-positive, not having achieved remission: C92.10

## 2015-01-29 LAB — COMPREHENSIVE METABOLIC PANEL
ALT: 21 U/L (ref 14–54)
AST: 24 U/L (ref 15–41)
Albumin: 3.9 g/dL (ref 3.5–5.0)
Alkaline Phosphatase: 28 U/L — ABNORMAL LOW (ref 38–126)
Anion gap: 8 (ref 5–15)
BUN: 26 mg/dL — ABNORMAL HIGH (ref 6–20)
CO2: 26 mmol/L (ref 22–32)
Calcium: 9.5 mg/dL (ref 8.9–10.3)
Chloride: 106 mmol/L (ref 101–111)
Creatinine, Ser: 1.14 mg/dL — ABNORMAL HIGH (ref 0.44–1.00)
GFR calc Af Amer: 50 mL/min — ABNORMAL LOW (ref >60)
GFR calc non Af Amer: 44 mL/min — ABNORMAL LOW (ref >60)
Glucose, Bld: 109 mg/dL — ABNORMAL HIGH (ref 65–99)
Potassium: 4.2 mmol/L (ref 3.5–5.1)
Sodium: 140 mmol/L (ref 135–145)
Total Bilirubin: 1.3 mg/dL — ABNORMAL HIGH (ref 0.3–1.2)
Total Protein: 7.5 g/dL (ref 6.5–8.1)

## 2015-01-29 LAB — URINALYSIS COMPLETE WITH MICROSCOPIC (ARMC ONLY)
Bacteria, UA: NONE SEEN
Bilirubin Urine: NEGATIVE
Glucose, UA: NEGATIVE mg/dL
Ketones, ur: NEGATIVE mg/dL
Nitrite: NEGATIVE
Protein, ur: NEGATIVE mg/dL
Specific Gravity, Urine: 1.006 (ref 1.005–1.030)
pH: 6 (ref 5.0–8.0)

## 2015-01-29 LAB — CBC
HCT: 35.1 % (ref 35.0–47.0)
Hemoglobin: 10.7 g/dL — ABNORMAL LOW (ref 12.0–16.0)
MCH: 19.2 pg — ABNORMAL LOW (ref 26.0–34.0)
MCHC: 30.6 g/dL — ABNORMAL LOW (ref 32.0–36.0)
MCV: 62.8 fL — ABNORMAL LOW (ref 80.0–100.0)
Platelets: 113 10*3/uL — ABNORMAL LOW (ref 150–440)
RBC: 5.58 MIL/uL — ABNORMAL HIGH (ref 3.80–5.20)
RDW: 18.4 % — ABNORMAL HIGH (ref 11.5–14.5)
WBC: 7.3 10*3/uL (ref 3.6–11.0)

## 2015-01-29 LAB — TSH: TSH: 1.314 u[IU]/mL (ref 0.350–4.500)

## 2015-01-29 MED ORDER — ONDANSETRON HCL 4 MG PO TABS: 4.0000 mg | ORAL_TABLET | Freq: Four times a day (QID) | ORAL | Status: DC | PRN

## 2015-01-29 MED ORDER — ONDANSETRON HCL 4 MG/2ML IJ SOLN: 4.0000 mg | Freq: Four times a day (QID) | INTRAMUSCULAR | Status: DC | PRN

## 2015-01-29 MED ORDER — CARVEDILOL 6.25 MG PO TABS
6.2500 mg | ORAL_TABLET | Freq: Two times a day (BID) | ORAL | Status: DC
  Administered 2015-01-29 – 2015-01-30 (×3): 6.25 mg via ORAL
  Filled 2015-01-29 (×3): qty 1

## 2015-01-29 MED ORDER — ASPIRIN EC 325 MG PO TBEC
325.0000 mg | DELAYED_RELEASE_TABLET | Freq: Once | ORAL | Status: AC
  Administered 2015-01-29: 325 mg via ORAL

## 2015-01-29 MED ORDER — CLINDAMYCIN PHOSPHATE 300 MG/50ML IV SOLN
300.0000 mg | Freq: Four times a day (QID) | INTRAVENOUS | Status: DC
  Filled 2015-01-29 (×2): qty 50

## 2015-01-29 MED ORDER — SODIUM CHLORIDE 0.9 % IJ SOLN
3.0000 mL | Freq: Two times a day (BID) | INTRAMUSCULAR | Status: DC
  Administered 2015-01-29 – 2015-01-30 (×3): 3 mL via INTRAVENOUS

## 2015-01-29 MED ORDER — LABETALOL HCL 5 MG/ML IV SOLN
10.0000 mg | Freq: Once | INTRAVENOUS | Status: AC
  Administered 2015-01-29: 10 mg via INTRAVENOUS

## 2015-01-29 MED ORDER — PRAVASTATIN SODIUM 20 MG PO TABS
20.0000 mg | ORAL_TABLET | Freq: Every day | ORAL | Status: DC
  Administered 2015-01-29: 20 mg via ORAL
  Filled 2015-01-29: qty 1

## 2015-01-29 MED ORDER — ACETAMINOPHEN 325 MG PO TABS
650.0000 mg | ORAL_TABLET | Freq: Four times a day (QID) | ORAL | Status: DC | PRN
  Administered 2015-01-29: 650 mg via ORAL
  Filled 2015-01-29: qty 2

## 2015-01-29 MED ORDER — ACETAMINOPHEN 650 MG RE SUPP
650.0000 mg | Freq: Four times a day (QID) | RECTAL | Status: DC | PRN
Start: 2015-01-29 — End: 2015-01-30

## 2015-01-29 MED ORDER — LABETALOL HCL 5 MG/ML IV SOLN: 10.0000 mg | INTRAVENOUS | Status: DC | PRN

## 2015-01-29 MED ORDER — HYDROCORTISONE 1 % EX OINT
TOPICAL_OINTMENT | Freq: Four times a day (QID) | CUTANEOUS | Status: DC | PRN
  Administered 2015-01-29: 1 via TOPICAL
  Administered 2015-01-29: 12:00:00 via TOPICAL
  Filled 2015-01-29: qty 28.35

## 2015-01-29 MED ORDER — DOCUSATE SODIUM 100 MG PO CAPS
100.0000 mg | ORAL_CAPSULE | Freq: Two times a day (BID) | ORAL | Status: DC
  Administered 2015-01-29 – 2015-01-30 (×2): 100 mg via ORAL
  Filled 2015-01-29 (×3): qty 1

## 2015-01-29 MED ORDER — LEVOTHYROXINE SODIUM 50 MCG PO TABS
50.0000 ug | ORAL_TABLET | ORAL | Status: DC
  Administered 2015-01-30: 50 ug via ORAL
  Filled 2015-01-29: qty 1

## 2015-01-29 MED ORDER — ASPIRIN EC 81 MG PO TBEC
DELAYED_RELEASE_TABLET | ORAL | Status: AC
  Administered 2015-01-29: 325 mg via ORAL
  Filled 2015-01-29: qty 4

## 2015-01-29 MED ORDER — HEPARIN SODIUM (PORCINE) 5000 UNIT/ML IJ SOLN
5000.0000 [IU] | Freq: Three times a day (TID) | INTRAMUSCULAR | Status: DC
  Administered 2015-01-29 – 2015-01-30 (×4): 5000 [IU] via SUBCUTANEOUS
  Filled 2015-01-29 (×4): qty 1

## 2015-01-29 MED ORDER — ASPIRIN EC 81 MG PO TBEC
81.0000 mg | DELAYED_RELEASE_TABLET | Freq: Every day | ORAL | Status: DC
  Administered 2015-01-29: 81 mg via ORAL
  Filled 2015-01-29: qty 1

## 2015-01-29 MED ORDER — LEVOTHYROXINE SODIUM 75 MCG PO TABS
75.0000 ug | ORAL_TABLET | ORAL | Status: DC
  Administered 2015-01-29: 75 ug via ORAL
  Filled 2015-01-29: qty 1

## 2015-01-29 MED ORDER — LABETALOL HCL 5 MG/ML IV SOLN
INTRAVENOUS | Status: AC
  Administered 2015-01-29: 10 mg via INTRAVENOUS
  Filled 2015-01-29: qty 4

## 2015-01-29 MED ORDER — CLINDAMYCIN PHOSPHATE 300 MG/50ML IV SOLN
300.0000 mg | Freq: Four times a day (QID) | INTRAVENOUS | Status: DC
  Administered 2015-01-29 – 2015-01-30 (×6): 300 mg via INTRAVENOUS
  Filled 2015-01-29 (×10): qty 50

## 2015-01-29 MED ORDER — ASPIRIN 325 MG PO TABS
325.0000 mg | ORAL_TABLET | Freq: Every day | ORAL | Status: DC
  Administered 2015-01-29: 325 mg via ORAL
  Filled 2015-01-29 (×3): qty 1

## 2015-01-29 MED ORDER — LEVOTHYROXINE SODIUM 75 MCG PO TABS: 75.0000 ug | ORAL_TABLET | ORAL | Status: DC

## 2015-01-29 MED ORDER — ASPIRIN EC 325 MG PO TBEC
325.0000 mg | DELAYED_RELEASE_TABLET | Freq: Every day | ORAL | Status: DC
  Administered 2015-01-30: 325 mg via ORAL
  Filled 2015-01-29 (×2): qty 1

## 2015-01-29 NOTE — ED Notes (Signed)
MD Diamond at bedside. 

## 2015-01-29 NOTE — ED Notes (Signed)
Linen changed.  Skin care given.  Patient repositioned in bed.

## 2015-01-29 NOTE — ED Notes (Signed)
AAOx3.  Skin warm and dry.  Moving all extremities equally.

## 2015-01-29 NOTE — ED Notes (Signed)
To CT scaN

## 2015-01-29 NOTE — ED Notes (Signed)
Pt assisted off of bedpan. No urine in bedpan but bed sheet and pad is soaked. Pt cleaned up and clean linen placed on bed.

## 2015-01-29 NOTE — Progress Notes (Signed)
[facility] NAME: Stephanie Harris    MR#:  [id number]  DATE OF BIRTH:  12/30/1930  SUBJECTIVE:  CHIEF COMPLAINT:    REVIEW OF SYSTEMS:  CONSTITUTIONAL: No fever, fatigue or weakness.  EYES: No blurred or double vision.  EARS, NOSE, AND THROAT: No tinnitus or ear pain.  RESPIRATORY: No cough, shortness of breath, wheezing or hemoptysis.  CARDIOVASCULAR: No chest pain, orthopnea, edema.  GASTROINTESTINAL: No nausea, vomiting, diarrhea or abdominal pain.  GENITOURINARY: No dysuria, hematuria.  ENDOCRINE: No polyuria, nocturia,  HEMATOLOGY: No anemia, easy bruising or bleeding SKIN: No rash or lesion. MUSCULOSKELETAL: No joint pain or arthritis.   NEUROLOGIC: No tingling, numbness, weakness.  PSYCHIATRY: No anxiety or depression.   DRUG ALLERGIES:   Allergies  Allergen Reactions  . Penicillins Other (See Comments)    Reaction: pt unsure. Chart had Hives listed.    VITALS:  Blood pressure 106/47, pulse 84, temperature 97.9 F (36.6 C), temperature source Oral, resp. rate 19, height 5' (1.524 m), weight 60.328 kg (133 lb), SpO2 97 %.  PHYSICAL EXAMINATION:  GENERAL:  81 y.o.-year-old patient lying in the bed with no acute distress.  EYES: Pupils equal, round, reactive to light and accommodation. No scleral icterus. Extraocular muscles intact.  HEENT: Head atraumatic, normocephalic. Oropharynx and nasopharynx clear.  NECK:  Supple, no jugular venous distention. No thyroid enlargement, no tenderness.  LUNGS: Normal breath sounds bilaterally, no wheezing, rales,rhonchi or crepitation. No use of accessory muscles of respiration.  CARDIOVASCULAR: S1, S2 normal. No murmurs, rubs, or gallops.  ABDOMEN: Soft, nontender, nondistended. Bowel sounds present. No organomegaly or mass.  EXTREMITIES: No pedal edema, cyanosis, or clubbing.  NEUROLOGIC: Cranial nerves II through XII are intact. Muscle strength 5/5 in all extremities.  Sensation intact. Gait not checked.  PSYCHIATRIC: The patient is alert and oriented x 3.  SKIN: No obvious rash, lesion, or ulcer.    LABORATORY PANEL:   CBC  Recent Labs Lab 01/29/15 0040  WBC 7.3  HGB 10.7*  HCT 35.1  PLT 113*   ------------------------------------------------------------------------------------------------------------------  Chemistries   Recent Labs Lab 01/29/15 0040  NA 140  K 4.2  CL 106  CO2 26  GLUCOSE 109*  BUN 26*  CREATININE 1.14*  CALCIUM 9.5  AST 24  ALT 21  ALKPHOS 28*  BILITOT 1.3*   ------------------------------------------------------------------------------------------------------------------  Cardiac Enzymes No results for input(s): TROPONINI in the last 168 hours. ------------------------------------------------------------------------------------------------------------------  RADIOLOGY:  Ct Head Wo Contrast  01/29/2015   CLINICAL DATA:  Initial evaluation for acute onset bilateral facial and arm numbness, expressive aphasia. History of prior stroke.  EXAM: CT HEAD WITHOUT CONTRAST  TECHNIQUE: Contiguous axial images were obtained from the base of the skull through the vertex without intravenous contrast.  COMPARISON:  Prior study from 10/29/2014  FINDINGS: Diffuse prominence of the CSF containing spaces is compatible with generalized cerebral atrophy. Patchy hypodensity within the periventricular and deep white matter both cerebral hemispheres most consistent with chronic small vessel ischemic disease. Encephalomalacia within the high right frontal lobe most compatible with remote infarct. Remote right cerebellar infarcts also noted. Remote lacunar infarct present within the right basal ganglia. This is new from prior.  No acute large vessel territory infarct identified. No intracranial hemorrhage. No mass lesion, mass effect, or midline shift. No hydrocephalus. No extra-axial fluid collection.  No acute abnormality about the orbits.  Scalp soft tissues within normal limits.  Calvarium intact. Paranasal sinuses and mastoid air cells are  clear.  IMPRESSION: 1. No acute intracranial process identified. 2. Remote lacunar infarct within the right basal ganglia. While this is chronic in appearance on today's exam, this is new relative to most recent CT from 10/29/2014. 3. Additional remote right frontal and cerebellar infarcts. 4. Generalized cerebral atrophy with chronic microvascular ischemic disease.   Electronically Signed   By: Jeannine Boga M.D.   On: 01/29/2015 01:07   Mr Brain Wo Contrast  01/29/2015   CLINICAL DATA:  81 year old female with stroke October 15, 2014. New onset bilateral arm weakness and difficulty speaking over the past day. History rheumatoid arthritis, hypertension and chronic myelocytic leukemia. Subsequent encounter.  EXAM: MRI HEAD WITHOUT CONTRAST  TECHNIQUE: Multiplanar, multiecho pulse sequences of the brain and surrounding structures were obtained without intravenous contrast.  COMPARISON:  Several prior CTs most recent 01/29/2015 head CT. No comparison brain MR.  FINDINGS: No acute infarct.  Right frontal lobe broad area of subcortical white matter hyperintensity. This has been noted on several prior exams dating back to 2012 and is most consistent with result prior infarct/ischemia rather than underlying mass.  Remote small infarcts involving centrum semiovale bilaterally. Remote small infarct peripheral aspect right cerebellum. Focal encephalomalacia superior left cerebellum unchanged consistent with remote infarct.  Significant small vessel disease type changes.  Minimal blood breakdown products associated with posterior right frontal lobe infarct and posterior right lenticular nucleus infarct otherwise no evidence of intracranial hemorrhage.  Global atrophy. Ventricular prominence unchanged and felt to be related to atrophy rather than hydrocephalus.  Major intracranial vascular structures are patent.   Hyperostosis frontalis interna incidentally noted. Post lens replacement otherwise orbital structures unremarkable.  Mild transverse ligament hypertrophy. Partially empty sella. Pineal region unremarkable.  Right parotid 6 mm T2 bright lesion of indeterminate etiology.  IMPRESSION: No acute infarct.  Remote infarcts and small vessel disease type changes as noted above.  Atrophy.  Right parotid 6 mm T2 bright lesion of indeterminate etiology   Electronically Signed   By: Genia Del M.D.   On: 01/29/2015 14:43    EKG:   Orders placed or performed during the hospital encounter of 01/29/15  . EKG 12-Lead  . EKG 12-Lead    ASSESSMENT AND PLAN:    1. TIA: The patient's still complaining of right-sided weakness.  she has an interval change in her head CT revealing lacunar infarcts within the right basal ganglia. We will get MRI of the brain. PT consult is placed. Fasting lipid panel is ordered. Patient is started on statin. Neurology consult is placed..Continue aspirin ,sHe may benefit from additional antiplatelet therapy although her past history is significant for CML and thrombocytopenia.  2. Hypertension: Uncontrolled. We do not need to aim for permissive hypertension as she does not have any acute cerebrovascular event. Continue carvedilol. Labetalol IV when necessary systolic blood pressure greater than 180. We may need to add another oral antihypertensive medication for discharge.  3. Rash: Likely started as an allergic reaction now with superimposed cellulitis. Continue IV clindamycin. ID consult is placed to Dr. Ola Spurr . 4. DVT prophylaxis: Heparin 5. GI prophylaxis: None   All the records are reviewed and case discussed with Care Management/Social Workerr. Management plans discussed with the patient, family and they are in agreement.  CODE STATUS: full code  TOTAL TIME TAKING CARE OF THIS PATIENT: 35 minutes.   POSSIBLE D/C IN 1-2 DAYS, DEPENDING ON CLINICAL  CONDITION.   [name] M.D on 01/29/2015 at 4:36 PM  Between 7am to 6pm -  Pager - [phone] After 6pm go to [email or web address] - password [email or web address] [facility]  [facility]  Office  [phone]  CC: Primary care physician; No primary care provider on file.

## 2015-01-29 NOTE — ED Notes (Signed)
Patient AAOx3.  Moving all extremities.  Skin warm and dry.  Patient is anxious.

## 2015-01-29 NOTE — H&P (Signed)
Stephanie Harris is an 81 y.o. female.   Chief Complaint: Abnormal speech HPI: The patient presents emergency department via EMS after an aphasic episode accompanied by perioral numbness and bilateral hand numbness. The patient is not clear how long the episode lasted but all symptoms had resolved by the time of our interview (less than 2 hours). The patient suffered a stroke in February 2016. In the emergency department tonight, CT of the head showed lacunar infarcts some of which are new in comparison to her previous stroke, but none of which are acute. Due to her history of CVA and recent symptoms the emergency department staff called for admission.  Past Medical History  Diagnosis Date  . Hypertension   . Stroke   . RA (rheumatoid arthritis)   . Heart disease   . CML (chronic myelocytic leukemia)     Onc at [facility]    Past Surgical History  Procedure Laterality Date  . Abdominal hysterectomy    . Cholecystectomy    . Total knee arthroplasty Right   . Rotator cuff repair Left   . Cataract extraction    . Percutaneous coronary stent intervention (pci-s)    . Carotid endarterectomy Left     Family History  Problem Relation Age of Onset  . Acute myelogenous leukemia Brother    Social History:  reports that she has never smoked. She does not have any smokeless tobacco history on file. Her alcohol and drug histories are not on file.  Allergies:  Allergies  Allergen Reactions  . Penicillins Other (See Comments)    Reaction: pt unsure. Chart had Hives listed.    Prior to Admission medications   Medication Sig Start Date End Date Taking? Authorizing Provider  aspirin EC 81 MG tablet Take 81 mg by mouth daily.   Yes Historical Provider, MD  carvedilol (COREG) 3.125 MG tablet Take 6.25 mg by mouth 2 (two) times daily.    Yes Historical Provider, MD  levothyroxine (SYNTHROID, LEVOTHROID) 50 MCG tablet Take 50 mcg by mouth every other day. On the opposite days from taking 57mg.   Yes  Historical Provider, MD  levothyroxine (SYNTHROID, LEVOTHROID) 75 MCG tablet Take 75 mcg by mouth every other day. On the opposite days from taking 514m   Yes Historical Provider, MD     Results for orders placed or performed during the hospital encounter of 01/29/15 (from the past 48 hour(s))  CBC     Status: Abnormal   Collection Time: 01/29/15 12:40 AM  Result Value Ref Range   WBC 7.3 3.6 - 11.0 K/uL   RBC 5.58 (H) 3.80 - 5.20 MIL/uL   Hemoglobin 10.7 (L) 12.0 - 16.0 g/dL   HCT 35.1 35.0 - 47.0 %   MCV 62.8 (L) 80.0 - 100.0 fL   MCH 19.2 (L) 26.0 - 34.0 pg   MCHC 30.6 (L) 32.0 - 36.0 g/dL   RDW 18.4 (H) 11.5 - 14.5 %   Platelets 113 (L) 150 - 440 K/uL  Comprehensive metabolic panel     Status: Abnormal   Collection Time: 01/29/15 12:40 AM  Result Value Ref Range   Sodium 140 135 - 145 mmol/L   Potassium 4.2 3.5 - 5.1 mmol/L   Chloride 106 101 - 111 mmol/L   CO2 26 22 - 32 mmol/L   Glucose, Bld 109 (H) 65 - 99 mg/dL   BUN 26 (H) 6 - 20 mg/dL   Creatinine, Ser 1.14 (H) 0.44 - 1.00 mg/dL   Calcium 9.5  8.9 - 10.3 mg/dL   Total Protein 7.5 6.5 - 8.1 g/dL   Albumin 3.9 3.5 - 5.0 g/dL   AST 24 15 - 41 U/L   ALT 21 14 - 54 U/L   Alkaline Phosphatase 28 (L) 38 - 126 U/L   Total Bilirubin 1.3 (H) 0.3 - 1.2 mg/dL   GFR calc non Af Amer 44 (L) >60 mL/min   GFR calc Af Amer 50 (L) >60 mL/min    Comment: (NOTE) The eGFR has been calculated using the CKD EPI equation. This calculation has not been validated in all clinical situations. eGFR's persistently <60 mL/min signify possible Chronic Kidney Disease.    Anion gap 8 5 - 15   Ct Head Wo Contrast  01/29/2015   CLINICAL DATA:  Initial evaluation for acute onset bilateral facial and arm numbness, expressive aphasia. History of prior stroke.  EXAM: CT HEAD WITHOUT CONTRAST  TECHNIQUE: Contiguous axial images were obtained from the base of the skull through the vertex without intravenous contrast.  COMPARISON:  Prior study from  10/29/2014  FINDINGS: Diffuse prominence of the CSF containing spaces is compatible with generalized cerebral atrophy. Patchy hypodensity within the periventricular and deep white matter both cerebral hemispheres most consistent with chronic small vessel ischemic disease. Encephalomalacia within the high right frontal lobe most compatible with remote infarct. Remote right cerebellar infarcts also noted. Remote lacunar infarct present within the right basal ganglia. This is new from prior.  No acute large vessel territory infarct identified. No intracranial hemorrhage. No mass lesion, mass effect, or midline shift. No hydrocephalus. No extra-axial fluid collection.  No acute abnormality about the orbits. Scalp soft tissues within normal limits.  Calvarium intact. Paranasal sinuses and mastoid air cells are clear.  IMPRESSION: 1. No acute intracranial process identified. 2. Remote lacunar infarct within the right basal ganglia. While this is chronic in appearance on today's exam, this is new relative to most recent CT from 10/29/2014. 3. Additional remote right frontal and cerebellar infarcts. 4. Generalized cerebral atrophy with chronic microvascular ischemic disease.   Electronically Signed   By: Jeannine Boga M.D.   On: 01/29/2015 01:07    Review of Systems  Constitutional: Negative for fever and chills.  HENT: Negative for sore throat and tinnitus.   Eyes: Negative for blurred vision and redness.  Respiratory: Negative for cough and shortness of breath.   Cardiovascular: Negative for chest pain, palpitations, orthopnea and PND.  Gastrointestinal: Negative for nausea, vomiting, abdominal pain and diarrhea.  Genitourinary: Negative for dysuria, urgency and frequency.  Musculoskeletal: Negative for myalgias and joint pain.  Skin: Negative for rash.       No lesions  Neurological: Negative for speech change, focal weakness and weakness.  Endo/Heme/Allergies: Does not bruise/bleed easily.        No temperature intolerance  Psychiatric/Behavioral: Negative for depression and suicidal ideas.    Blood pressure 179/76, pulse 77, temperature 97.7 F (36.5 C), temperature source Oral, resp. rate 21, height 5' (1.524 m), weight 60.328 kg (133 lb), SpO2 99 %. Physical Exam  Vitals reviewed. Constitutional: She is oriented to person, place, and time. She appears well-developed and well-nourished.  HENT:  Head: Normocephalic and atraumatic.  Eyes: Conjunctivae and EOM are normal. Pupils are equal, round, and reactive to light.  Neck: Normal range of motion. No JVD present. No tracheal deviation present. No thyromegaly present.  No bruits  Cardiovascular: Normal rate, regular rhythm, normal heart sounds and intact distal pulses.  Exam reveals no gallop  and no friction rub.   No murmur heard. Respiratory: Effort normal and breath sounds normal. No respiratory distress.  GI: Soft. Bowel sounds are normal. She exhibits no distension. There is no tenderness.  Genitourinary:  Deferred  Musculoskeletal: Normal range of motion. She exhibits no edema.  Lymphadenopathy:    She has no cervical adenopathy.  Neurological: She is alert and oriented to person, place, and time. She has normal strength. No cranial nerve deficit or sensory deficit. She exhibits normal muscle tone. She displays a negative Romberg sign. Coordination normal.  Skin: Skin is warm and dry. Rash noted.  Psychiatric: She has a normal mood and affect. Her behavior is normal. Judgment and thought content normal.     Assessment/Plan This is an 81 year old female admitted for transient ischemic attack. 1. TIA: The patient's symptoms have resolved however she has an interval change in her head CT revealing lacunar infarcts within the right basal ganglia. We will observe her for neurologic change. At this time she has no neurologic deficits. Review of her medications shows that she is only on aspirin. He may benefit from additional  antiplatelet therapy although her past history is significant for CML and thrombocytopenia. Neuro consultation for recommendations regarding further imaging (recent carotid ultrasound-- may need MRI +/- imaging of extracranial vessels) and to help weigh the risks and benefits of other antiplatelet agents. 2. Hypertension: Uncontrolled. We do not need to aim for permissive hypertension as she does not have any acute cerebrovascular event. Continue carvedilol. Labetalol IV when necessary systolic blood pressure greater than 180. We may need to add another oral antihypertensive medication for discharge. 3. Rash: Likely started as an allergic reaction. She has some superficially infected blisters. I'll give her a dose of IV antibiotics. She had been prescribed steroids by her outpatient physician but she has upcoming laboratory work with her oncologist this week. Thus, I will prescribe a topical steroid so as not to alter oncology workup or chemotherapy regimen. 4. DVT prophylaxis: Heparin 5. GI prophylaxis: None The patient is a full code. Time spent on admission orders and patient care possibly 45 minutes  Harrie Foreman 01/29/2015, 4:30 AM

## 2015-01-29 NOTE — ED Provider Notes (Addendum)
[facility] Emergency Department Provider Note  ____________________________________________  Time seen: 12:20 AM  I have reviewed the triage vital signs and the nursing notes.   HISTORY  Chief Complaint Transient Ischemic Attack      HPI Stephanie Harris is a 81 y.o. female resents with acute onset of difficulty speaking approximately 11:00 PM patient admits to bilateral arm numbness as well. Patient denies any localized weakness. Patient of note recently had a cerebrovascular accident on the 22nd 2016.    Past Medical History  Diagnosis Date  . Hypertension   . Stroke   . RA (rheumatoid arthritis)   . Heart disease     Patient Active Problem List   Diagnosis Date Noted  . Allergic arthritis, hand     Past Surgical History  Procedure Laterality Date  . Abdominal hysterectomy    . Cholecystectomy      Current Outpatient Rx  Name  Route  Sig  Dispense  Refill  . aspirin 81 MG chewable tablet   Oral   Chew by mouth daily.         . carvedilol (COREG) 3.125 MG tablet   Oral   Take 0.125 mg by mouth 2 (two) times daily with a meal.         . levothyroxine (SYNTHROID, LEVOTHROID) 50 MCG tablet   Oral   Take 50 mcg by mouth daily before breakfast.         . nitrofurantoin (MACRODANTIN) 100 MG capsule   Oral   Take 100 mg by mouth 2 (two) times daily.           Allergies Penicillins  No family history on file.  Social History History  Substance Use Topics  . Smoking status: Never Smoker   . Smokeless tobacco: Not on file  . Alcohol Use: Not on file    Review of Systems  Constitutional: Negative for fever. Eyes: Negative for visual changes. ENT: Negative for sore throat. Cardiovascular: Negative for chest pain. Respiratory: Negative for shortness of breath. Gastrointestinal: Negative for abdominal pain, vomiting and diarrhea. Genitourinary: Negative for dysuria. Musculoskeletal: Negative for back pain. Skin:  Negative for rash. Neurological: Negative for headaches, focal weakness or numbness. Positive Speech difficult   10-point ROS otherwise negative.  ____________________________________________   PHYSICAL EXAM:  VITAL SIGNS: ED Triage Vitals  Enc Vitals Group     BP 01/29/15 0021 166/74 mmHg     Pulse Rate 01/29/15 0021 77     Resp 01/29/15 0021 14     Temp 01/29/15 0021 97.7 F (36.5 C)     Temp Source 01/29/15 0021 Oral     SpO2 01/29/15 0021 100 %     Weight 01/29/15 0021 133 lb (60.328 kg)     Height 01/29/15 0021 5' (1.524 m)     Head Cir --      Peak Flow --      Pain Score --      Pain Loc --      Pain Edu? --      Excl. in [place]? --      Constitutional: Alert and oriented. Well appearing and in no distress. Eyes: Conjunctivae are normal. PERRL. Normal extraocular movements. ENT   Head: Normocephalic and atraumatic.   Nose: No congestion/rhinnorhea.   Mouth/Throat: Mucous membranes are moist.   Neck: No stridor. Cardiovascular: Normal rate, regular rhythm. Normal and symmetric distal pulses are present in all extremities. No murmurs, rubs, or gallops. Respiratory: Normal respiratory effort  without tachypnea nor retractions. Breath sounds are clear and equal bilaterally. No wheezes/rales/rhonchi. Gastrointestinal: Soft and nontender. No distention. There is no CVA tenderness. Genitourinary: deferred Musculoskeletal: Nontender with normal range of motion in all extremities. No joint effusions.  No lower extremity tenderness nor edema. Neurologic:  Normal speech and language. No gross focal neurologic deficits are appreciated. Speech is normal.  Skin:  Skin is warm, dry and intact. No rash noted. Psychiatric: Mood and affect are normal. Speech and behavior are normal. Patient exhibits appropriate insight and judgment.  ____________________________________________    LABS (pertinent positives/negatives)  Labs Reviewed  CBC - Abnormal; Notable for the  following:    RBC 5.58 (*)    Hemoglobin 10.7 (*)    MCV 62.8 (*)    MCH 19.2 (*)    MCHC 30.6 (*)    RDW 18.4 (*)    Platelets 113 (*)    All other components within normal limits  COMPREHENSIVE METABOLIC PANEL - Abnormal; Notable for the following:    Glucose, Bld 109 (*)    BUN 26 (*)    Creatinine, Ser 1.14 (*)    Alkaline Phosphatase 28 (*)    Total Bilirubin 1.3 (*)    GFR calc non Af Amer 44 (*)    GFR calc Af Amer 50 (*)    All other components within normal limits  URINALYSIS COMPLETEWITH MICROSCOPIC ([facility] ONLY)     ____________________________________________   EKG   Date: 01/29/2015  Rate: 79  Rhythm: normal sinus rhythm  QRS Axis: normal  Intervals: normal  ST/T Wave abnormalities: normal  Conduction Disutrbances: none  Narrative Interpretation: unremarkable      ____________________________________________    RADIOLOGY  Remote right basal ganglia and right cerebellar infarcts    INITIAL IMPRESSION / ASSESSMENT AND PLAN / ED COURSE  Pertinent labs & imaging results that were available during my care of the patient were reviewed by me and considered in my medical decision making (see chart for details).  History physical exam consistent with cerebrovascular accident. As such aspirin 325 mg by mouth given patient will be admitted to the hospital further evaluation. TPA not given due to recent CVA in March 2016 and an [place] stroke scale 0  ____________________________________________   FINAL CLINICAL IMPRESSION(S) / ED DIAGNOSES  Final diagnoses:  CVA (cerebral vascular accident)      Gregor Hams, MD 01/29/15 [place], MD 01/29/15 [id number]

## 2015-01-29 NOTE — Consult Note (Signed)
CC: aphasia  HPI: [name] is an 81 y.o. female presents emergency department via EMS after an aphasic episode accompanied by perioral numbness and bilateral hand numbness. The patient is not clear how long the episode lasted but all symptoms had resolved by the time of our interview (less than 2 hours). The patient suffered a stroke in February 2016. In the emergency department tonight, CT of the head showed lacunar infarcts some of which are new in comparison to her previous imaging, but none of which are acute.  Currently back to baseline.  Only complaint includes b/l knee pain which is chronic.    Past Medical History  Diagnosis Date  . Hypertension   . Stroke   . RA (rheumatoid arthritis)   . Heart disease   . CML (chronic myelocytic leukemia)     Onc at [facility]    Past Surgical History  Procedure Laterality Date  . Abdominal hysterectomy    . Cholecystectomy    . Total knee arthroplasty Right   . Rotator cuff repair Left   . Cataract extraction    . Percutaneous coronary stent intervention (pci-s)    . Carotid endarterectomy Left     Family History  Problem Relation Age of Onset  . Acute myelogenous leukemia Brother     Social History:  reports that she has never smoked. She does not have any smokeless tobacco history on file. Her alcohol and drug histories are not on file.  Allergies  Allergen Reactions  . Penicillins Other (See Comments)    Reaction: pt unsure. Chart had Hives listed.    Medications: I have reviewed the patient's current medications.  ROS: History obtained from the patient  General ROS: negative for - chills, fatigue, fever, night sweats, weight gain or weight loss Psychological ROS: negative for - behavioral disorder, hallucinations, memory difficulties, mood swings or suicidal ideation Ophthalmic ROS: negative for - blurry vision, double vision, eye pain or loss of vision ENT ROS: negative for - epistaxis, nasal discharge, oral lesions,  sore throat, tinnitus or vertigo Allergy and Immunology ROS: negative for - hives or itchy/watery eyes Hematological and Lymphatic ROS: negative for - bleeding problems, bruising or swollen lymph nodes Endocrine ROS: negative for - galactorrhea, hair pattern changes, polydipsia/polyuria or temperature intolerance Respiratory ROS: negative for - cough, hemoptysis, shortness of breath or wheezing Cardiovascular ROS: negative for - chest pain, dyspnea on exertion, edema or irregular heartbeat Gastrointestinal ROS: negative for - abdominal pain, diarrhea, hematemesis, nausea/vomiting or stool incontinence Genito-Urinary ROS: negative for - dysuria, hematuria, incontinence or urinary frequency/urgency Musculoskeletal ROS: negative for - joint swelling or muscular weakness Neurological ROS: as noted in HPI Dermatological ROS: negative for rash and skin lesion changes  Physical Examination: Blood pressure 106/47, pulse 84, temperature 97.9 F (36.6 C), temperature source Oral, resp. rate 19, height 5' (1.524 m), weight 60.328 kg (133 lb), SpO2 97 %.   Neurological Examination Mental Status: Alert, oriented, thought content appropriate.  Speech fluent without evidence of aphasia.  Able to follow 3 step commands without difficulty. Cranial Nerves: II: Discs flat bilaterally; Visual fields grossly normal, pupils equal, round, reactive to light and accommodation III,IV, VI: ptosis not present, extra-ocular motions intact bilaterally V,VII: smile symmetric, facial light touch sensation normal bilaterally VIII: hearing normal bilaterally IX,X: gag reflex present XI: bilateral shoulder shrug XII: midline tongue extension Motor: Right : Upper extremity   5/5    Left:     Upper extremity   5/5  Lower extremity  4/5     Lower extremity   4/5 Tone and bulk:normal tone throughout; no atrophy noted Sensory: Pinprick and light touch intact throughout, bilaterally Deep Tendon Reflexes: 1+ upper  extremities, absent LE.  Plantars: Right: downgoing   Left: downgoing Cerebellar: normal finger-to-nose, normal rapid alternating movements and normal heel-to-shin test Gait: normal gait and station      Laboratory Studies:   Basic Metabolic Panel:  Recent Labs Lab 01/29/15 0040  NA 140  K 4.2  CL 106  CO2 26  GLUCOSE 109*  BUN 26*  CREATININE 1.14*  CALCIUM 9.5    Liver Function Tests:  Recent Labs Lab 01/29/15 0040  AST 24  ALT 21  ALKPHOS 28*  BILITOT 1.3*  PROT 7.5  ALBUMIN 3.9   No results for input(s): LIPASE, AMYLASE in the last 168 hours. No results for input(s): AMMONIA in the last 168 hours.  CBC:  Recent Labs Lab 01/29/15 0040  WBC 7.3  HGB 10.7*  HCT 35.1  MCV 62.8*  PLT 113*    Cardiac Enzymes: No results for input(s): CKTOTAL, CKMB, CKMBINDEX, TROPONINI in the last 168 hours.  BNP: Invalid input(s): POCBNP  CBG: No results for input(s): GLUCAP in the last 168 hours.  Microbiology: Results for orders placed or performed in visit on 11/23/13  Urine culture     Status: None   Collection Time: 11/23/13  7:58 PM  Result Value Ref Range Status   Micro Text Report   Final       SOURCE: CLEAN CATCH    ORGANISM 1                50,000 CFU/ML ESCHERICHIA COLI   ORGANISM 2                5000 CFU GRAM NEGATIVE ROD   COMMENT                   -   ANTIBIOTIC                    ORG#1     AMPICILLIN                    R         CEFAZOLIN                     S         CEFOXITIN                     R         CEFTRIAXONE                   S         CIPROFLOXACIN                 R         ERTAPENEM                     S         GENTAMICIN                    S         IMIPENEM                      S         LEVOFLOXACIN  R         NITROFURANTOIN                R         TRIMETHOPRIM/SULFAMETHOXAZOLE S             Coagulation Studies: No results for input(s): LABPROT, INR in the last 72 hours.  Urinalysis:  Recent  Labs Lab 01/29/15 0600  COLORURINE STRAW*  LABSPEC 1.006  PHURINE 6.0  GLUCOSEU NEGATIVE  HGBUR 1+*  BILIRUBINUR NEGATIVE  KETONESUR NEGATIVE  PROTEINUR NEGATIVE  NITRITE NEGATIVE  LEUKOCYTESUR 1+*    Lipid Panel:  No results found for: CHOL, TRIG, HDL, CHOLHDL, VLDL, LDLCALC  HgbA1C: No results found for: HGBA1C  Urine Drug Screen:  No results found for: LABOPIA, COCAINSCRNUR, LABBENZ, AMPHETMU, THCU, LABBARB  Alcohol Level: No results for input(s): ETH in the last 168 hours.    Imaging: Ct Head Wo Contrast  01/29/2015   CLINICAL DATA:  Initial evaluation for acute onset bilateral facial and arm numbness, expressive aphasia. History of prior stroke.  EXAM: CT HEAD WITHOUT CONTRAST  TECHNIQUE: Contiguous axial images were obtained from the base of the skull through the vertex without intravenous contrast.  COMPARISON:  Prior study from 10/29/2014  FINDINGS: Diffuse prominence of the CSF containing spaces is compatible with generalized cerebral atrophy. Patchy hypodensity within the periventricular and deep white matter both cerebral hemispheres most consistent with chronic small vessel ischemic disease. Encephalomalacia within the high right frontal lobe most compatible with remote infarct. Remote right cerebellar infarcts also noted. Remote lacunar infarct present within the right basal ganglia. This is new from prior.  No acute large vessel territory infarct identified. No intracranial hemorrhage. No mass lesion, mass effect, or midline shift. No hydrocephalus. No extra-axial fluid collection.  No acute abnormality about the orbits. Scalp soft tissues within normal limits.  Calvarium intact. Paranasal sinuses and mastoid air cells are clear.  IMPRESSION: 1. No acute intracranial process identified. 2. Remote lacunar infarct within the right basal ganglia. While this is chronic in appearance on today's exam, this is new relative to most recent CT from 10/29/2014. 3. Additional remote right  frontal and cerebellar infarcts. 4. Generalized cerebral atrophy with chronic microvascular ischemic disease.   Electronically Signed   By: Jeannine Boga M.D.   On: 01/29/2015 01:07   Mr Brain Wo Contrast  01/29/2015   CLINICAL DATA:  81 year old female with stroke October 15, 2014. New onset bilateral arm weakness and difficulty speaking over the past day. History rheumatoid arthritis, hypertension and chronic myelocytic leukemia. Subsequent encounter.  EXAM: MRI HEAD WITHOUT CONTRAST  TECHNIQUE: Multiplanar, multiecho pulse sequences of the brain and surrounding structures were obtained without intravenous contrast.  COMPARISON:  Several prior CTs most recent 01/29/2015 head CT. No comparison brain MR.  FINDINGS: No acute infarct.  Right frontal lobe broad area of subcortical white matter hyperintensity. This has been noted on several prior exams dating back to 2012 and is most consistent with result prior infarct/ischemia rather than underlying mass.  Remote small infarcts involving centrum semiovale bilaterally. Remote small infarct peripheral aspect right cerebellum. Focal encephalomalacia superior left cerebellum unchanged consistent with remote infarct.  Significant small vessel disease type changes.  Minimal blood breakdown products associated with posterior right frontal lobe infarct and posterior right lenticular nucleus infarct otherwise no evidence of intracranial hemorrhage.  Global atrophy. Ventricular prominence unchanged and felt to be related to atrophy rather than hydrocephalus.  Major intracranial vascular structures are patent.  Hyperostosis frontalis interna incidentally noted. Post lens replacement otherwise orbital structures unremarkable.  Mild transverse ligament hypertrophy. Partially empty sella. Pineal region unremarkable.  Right parotid 6 mm T2 bright lesion of indeterminate etiology.  IMPRESSION: No acute infarct.  Remote infarcts and small vessel disease type changes as noted  above.  Atrophy.  Right parotid 6 mm T2 bright lesion of indeterminate etiology   Electronically Signed   By: Genia Del M.D.   On: 01/29/2015 14:43     Assessment/Plan: 81 y/o F. With HTN, previous strokes, RA, CML presents with transient aphasia.  Now back to baseline. MRI no acute changes.   - ASA 81 changed to 325 daily - out pt follow with orthopedic surgery for b/l knees as pt had a schedule appointment today.  - tele to look for possible arhythmia - cardiology as out pt for possible loop recorder as strokes in diff vac distributions to look for possible cardiac arhythmia  - d/c planning from neuro stand point - d/w pt and husband.  Leotis Pain   01/29/2015, 6:50 PM

## 2015-01-29 NOTE — Progress Notes (Signed)
PT Cancellation Note  Patient Details Name: Stephanie Harris MRN: [id number] DOB: [birth year]   Cancelled Treatment:    Reason Eval/Treat Not Completed: Patient declined, no reason specified.  Tired from her ST evaluation and wants to rest.   Ramond Dial 01/29/2015, 5:57 PM   Mee Hives, PT MS Acute Rehab Dept. Number: [facility] [id number] and [place] [phone]

## 2015-01-29 NOTE — ED Notes (Signed)
Pt report received from [employer]. Pt care assumed.

## 2015-01-29 NOTE — ED Notes (Signed)
C/O arms feeling numb and having difficulty speaking.  Onset of symptoms 2300 01/27/14.  History of CVA 10/15/2014.

## 2015-01-30 DIAGNOSIS — D696 Thrombocytopenia, unspecified: Secondary | ICD-10-CM | POA: Diagnosis not present

## 2015-01-30 DIAGNOSIS — R21 Rash and other nonspecific skin eruption: Secondary | ICD-10-CM | POA: Diagnosis not present

## 2015-01-30 DIAGNOSIS — I10 Essential (primary) hypertension: Secondary | ICD-10-CM | POA: Diagnosis not present

## 2015-01-30 DIAGNOSIS — G459 Transient cerebral ischemic attack, unspecified: Secondary | ICD-10-CM | POA: Diagnosis not present

## 2015-01-30 LAB — LIPID PANEL
Cholesterol: 138 mg/dL (ref 0–200)
HDL: 45 mg/dL (ref >40)
LDL Cholesterol: 79 mg/dL (ref 0–99)
Total CHOL/HDL Ratio: 3.1 RATIO
Triglycerides: 69 mg/dL (ref <150)
VLDL: 14 mg/dL (ref 0–40)

## 2015-01-30 MED ORDER — SACCHAROMYCES BOULARDII 250 MG PO CAPS: 250.0000 mg | ORAL_CAPSULE | Freq: Two times a day (BID) | ORAL | Status: DC

## 2015-01-30 MED ORDER — CLINDAMYCIN HCL 300 MG PO CAPS: 300.0000 mg | ORAL_CAPSULE | Freq: Three times a day (TID) | ORAL | Status: AC

## 2015-01-30 MED ORDER — ASPIRIN 325 MG PO TBEC: 325.0000 mg | DELAYED_RELEASE_TABLET | Freq: Every day | ORAL | Status: DC

## 2015-01-30 NOTE — Evaluation (Signed)
Physical Therapy Evaluation Patient Details Name: Stephanie Harris MRN: [id number] DOB: 08-28-1930 Today's Date: 01/30/2015   History of Present Illness  Stephanie Harris is an 81 y.o. female presents emergency department via EMS after an aphasic episode accompanied by perioral numbness and bilateral hand numbness. The patient is not clear how long the episode lasted but all symptoms had resolved by the time of our interview (less than 2 hours). The patient suffered a stroke in February 2016. ICurrently back to baseline. Only complaint includes b/l knee pain which is chronic.   Clinical Impression  Patient presents with generalized weakness and decreased speed with all mobility including bed mobility, transfers and ambulation.  Patient needs CGA for all mobility and is able to ambulate with RW 75 feet with CGA. Dynamic standing balance is fair.  Static and dynamic sitting balance is good.  Patient will benefit from skilled PT to improve strength, balance and gait.     Follow Up Recommendations Outpatient PT    Equipment Recommendations       Recommendations for Other Services       Precautions / Restrictions Precautions Precautions: None Restrictions Weight Bearing Restrictions: No      Mobility  Bed Mobility Overal bed mobility: Modified Independent                Transfers Overall transfer level: Modified independent Equipment used: Rolling walker (2 wheeled)                Ambulation/Gait Ambulation/Gait assistance: Modified independent (Device/Increase time)   Assistive device: Rolling walker (2 wheeled) Gait Pattern/deviations: WFL(Within Functional Limits)   Gait velocity interpretation: <1.8 ft/sec, indicative of risk for recurrent falls    Stairs            Wheelchair Mobility    Modified Rankin (Stroke Patients Only)       Balance Overall balance assessment: Modified Independent                                            Pertinent Vitals/Pain Pain Assessment: No/denies pain    Home Living Family/patient expects to be discharged to:: Private residence Living Arrangements: Spouse/significant other Available Help at Discharge: Family Type of Home: House       Home Layout: One level        Prior Function Level of Independence: Independent               Hand Dominance   Dominant Hand: Right    Extremity/Trunk Assessment   Upper Extremity Assessment: Generalized weakness           Lower Extremity Assessment: Generalized weakness      Cervical / Trunk Assessment: Normal  Communication   Communication: No difficulties  Cognition Arousal/Alertness: Awake/alert Behavior During Therapy: WFL for tasks assessed/performed Overall Cognitive Status: Within Functional Limits for tasks assessed                      General Comments      Exercises        Assessment/Plan    PT Assessment Patient needs continued PT services  PT Diagnosis Difficulty walking;Generalized weakness   PT Problem List Decreased strength;Decreased activity tolerance;Decreased balance;Decreased mobility  PT Treatment Interventions Gait training;Therapeutic exercise;Balance training   PT Goals (Current goals can be found in the Care Plan section) Acute Rehab PT Goals  Patient Stated Goal: Patient wants to be able to walk better. PT Goal Formulation: With patient Time For Goal Achievement: 02/13/15 Potential to Achieve Goals: Fair    Frequency 7X/week   Barriers to discharge        Co-evaluation               End of Session Equipment Utilized During Treatment: Gait belt Activity Tolerance: Patient limited by fatigue Patient left: in bed;with call bell/phone within reach;with bed alarm set      Functional Assessment Tool Used: clinical judgement Functional Limitation: Mobility: Walking and moving around Mobility: Walking and Moving Around Current Status (D3267): At least 60  percent but less than 80 percent impaired, limited or restricted Mobility: Walking and Moving Around Goal Status [phone]): At least 40 percent but less than 60 percent impaired, limited or restricted    Time: 1030-1105 PT Time Calculation (min) (ACUTE ONLY): 35 min   Charges:   PT Evaluation $Initial PT Evaluation Tier I: 1 Procedure PT Treatments $Gait Training: 8-22 mins   PT G Codes:   PT G-Codes **NOT FOR INPATIENT CLASS** Functional Assessment Tool Used: clinical judgement Functional Limitation: Mobility: Walking and moving around Mobility: Walking and Moving Around Current Status (K9983): At least 60 percent but less than 80 percent impaired, limited or restricted Mobility: Walking and Moving Around Goal Status [phone]): At least 40 percent but less than 60 percent impaired, limited or restricted   Alanson Puls, PT, DPT  Arelia Sneddon S 01/30/2015, 11:33 AM

## 2015-01-30 NOTE — Discharge Summary (Signed)
[facility] [place] NAME: Stephanie Harris    MR#:  [id number]  DATE OF BIRTH:  12-Jul-1931  DATE OF ADMISSION:  01/29/2015 ADMITTING PHYSICIAN: Harrie Foreman, MD  DATE OF DISCHARGE: 01/30/2015  2:23 PM  PRIMARY CARE PHYSICIAN: No primary care provider on file.    ADMISSION DIAGNOSIS:  CVA (cerebral vascular accident) [I63.9]  DISCHARGE DIAGNOSIS:  Active Problems:   TIA (transient ischemic attack)  left lower extremity cellulitis  SECONDARY DIAGNOSIS:   Past Medical History  Diagnosis Date  . Hypertension   . Stroke   . RA (rheumatoid arthritis)   . Heart disease   . CML (chronic myelocytic leukemia)     Onc at [facility]:  Brief history and physical  Patient came into the ED with a chief complaint of abnormal speech. Please review history and physical for details. CT of the head has revealed lackunar  infarcts which are somewhat new in comparison to her previous studies Please review history and physical for complete details  Hospital course  1. TIA: The patient's  right-sided weakness significantly improved.. she has an interval change in her head CT revealing lacunar infarcts within the right basal ganglia which are not acute .No acute changes in   MRI of the brain. PT consult is placed, have recommended outpatient physical therapy. Fasting lipid panel ihas revealed LDL at 79 . Neurology consult is placed, we have recommended to provide aspirin 325 mg by mouth once daily   2. Hypertension: Uncontrolled the time of admission but better today.  We do not need to aim for permissive hypertension as she does not have any acute cerebrovascular event. Continue carvedilol.   3. RashWith cellulitis : Likely started as an allergic reaction now with superimposed cellulitis. with IV clindamycin her clinical situation improved. Plan is to discharge the patient with by mouth clindamycin for 7 more days. Patient can follow  up with infectious disease Dr. Ola Spurr as needed basis if there is no improvement   4. DVT prophylaxis: Heparin   DISCHARGE CONDITIONS:   Satisfactory   CONSULTS OBTAINED:  Treatment Team:  Leotis Pain, MD Adrian Prows, MD   PROCEDURESnone   DRUG ALLERGIES:   Allergies  Allergen Reactions  . Penicillins Other (See Comments)    Reaction: pt unsure. Chart had Hives listed.    DISCHARGE MEDICATIONS:   Discharge Medication List as of 01/30/2015  1:42 PM    START taking these medications   Details  clindamycin (CLEOCIN) 300 MG capsule Take 1 capsule (300 mg total) by mouth 3 (three) times daily., Starting 01/30/2015, Until Wed 02/06/15, Print    saccharomyces boulardii (FLORASTOR) 250 MG capsule Take 1 capsule (250 mg total) by mouth 2 (two) times daily., Starting 01/30/2015, Until Discontinued, Normal      CONTINUE these medications which have CHANGED   Details  aspirin EC 325 MG EC tablet Take 1 tablet (325 mg total) by mouth daily., Starting 01/30/2015, Until Discontinued, Normal      CONTINUE these medications which have NOT CHANGED   Details  carvedilol (COREG) 3.125 MG tablet Take 6.25 mg by mouth 2 (two) times daily. , Until Discontinued, Historical Med    !! levothyroxine (SYNTHROID, LEVOTHROID) 50 MCG tablet Take 50 mcg by mouth every other day. On the opposite days from taking 53mcg., Until Discontinued, Historical Med    !! levothyroxine (SYNTHROID, LEVOTHROID) 75 MCG tablet Take 75 mcg by mouth every other day.  On the opposite days from taking 83mcg, Until Discontinued, Historical Med     !! - Potential duplicate medications found. Please discuss with provider.       DISCHARGE INSTRUCTIONS:   Follow-up with primary care physician in a week Follow-up with neurology as needed basis next   follow-up with the infectious disease doctor friends around in 1-2 weeks if there is no improvement in the rash   follow-up with [name] as scheduled tomorrow  June 9  DIET:  Cardiac diet  DISCHARGE CONDITION:  Fair  ACTIVITY:  Activity as tolerated,outpatient physical therapy  OXYGEN:  Home Oxygen: No.   Oxygen Delivery: room air  DISCHARGE LOCATION:  home   If you experience worsening of your admission symptoms, develop shortness of breath, life threatening emergency, suicidal or homicidal thoughts you must seek medical attention immediately by calling 911 or calling your MD immediately  if symptoms less severe.  You Must read complete instructions/literature along with all the possible adverse reactions/side effects for all the Medicines you take and that have been prescribed to you. Take any new Medicines after you have completely understood and accpet all the possible adverse reactions/side effects.   Please note  You were cared for by a hospitalist during your hospital stay. If you have any questions about your discharge medications or the care you received while you were in the hospital after you are discharged, you can call the unit and asked to speak with the hospitalist on call if the hospitalist that took care of you is not available. Once you are discharged, your primary care physician will handle any further medical issues. Please note that NO REFILLS for any discharge medications will be authorized once you are discharged, as it is imperative that you return to your primary care physician (or establish a relationship with a primary care physician if you do not have one) for your aftercare needs so that they can reassess your need for medications and monitor your lab values.     Today  Chief Complaint  Patient presents with  . Transient Ischemic Attack   Patient is feeling better. Admits that her rash is getting better. Right-sided weakness is better  ROS:  CONSTITUTIONAL: Denies fevers, chills. Denies any fatigue, weakness.  EYES: Denies blurry vision, double vision, eye pain. EARS, NOSE, THROAT: Denies tinnitus, ear  pain, hearing loss. RESPIRATORY: Denies cough, wheeze, shortness of breath.  CARDIOVASCULAR: Denies chest pain, palpitations, edema.  GASTROINTESTINAL: Denies nausea, vomiting, diarrhea, abdominal pain. Denies bright red blood per rectum. GENITOURINARY: Denies dysuria, hematuria. ENDOCRINE: Denies nocturia or thyroid problems. HEMATOLOGIC AND LYMPHATIC: Denies easy bruising or bleeding. SKIN: Denies rash or lesion. MUSCULOSKELETAL: Denies pain in neck, back, shoulder, knees, hips or arthritic symptoms.  NEUROLOGIC: Denies paralysis, paresthesias.  PSYCHIATRIC: Denies anxiety or depressive symptoms.   VITAL SIGNS:  Blood pressure 120/62, pulse 75, temperature 97.5 F (36.4 C), temperature source Oral, resp. rate 19, height 5' (1.524 m), weight 59.557 kg (131 lb 4.8 oz), SpO2 98 %.  I/O:   Intake/Output Summary (Last 24 hours) at 01/30/15 1508 Last data filed at 01/30/15 1330  Gross per 24 hour  Intake    516 ml  Output   1450 ml  Net   -934 ml    PHYSICAL EXAMINATION:  GENERAL:  81 y.o.-year-old patient lying in the bed with no acute distress.  EYES: Pupils equal, round, reactive to light and accommodation. No scleral icterus. Extraocular muscles intact.  HEENT: Head atraumatic, normocephalic. Oropharynx and  nasopharynx clear.  NECK:  Supple, no jugular venous distention. No thyroid enlargement, no tenderness.  LUNGS: Normal breath sounds bilaterally, no wheezing, rales,rhonchi or crepitation. No use of accessory muscles of respiration.  CARDIOVASCULAR: S1, S2 normal. No murmurs, rubs, or gallops.  ABDOMEN: Soft, non-tender, non-distended. Bowel sounds present. No organomegaly or mass.  EXTREMITIES: No pedal edema, cyanosis, or clubbing.  NEUROLOGIC: Cranial nerves II through XII are intact. Muscle strength 5/5 in all extremities. Sensation intact. Gait not checked.  PSYCHIATRIC: The patient is alert and oriented x 3.  SKIN: No obvious rash, lesion, or ulcer.   DATA REVIEW:    CBC  Recent Labs Lab 01/29/15 0040  WBC 7.3  HGB 10.7*  HCT 35.1  PLT 113*    Chemistries   Recent Labs Lab 01/29/15 0040  NA 140  K 4.2  CL 106  CO2 26  GLUCOSE 109*  BUN 26*  CREATININE 1.14*  CALCIUM 9.5  AST 24  ALT 21  ALKPHOS 28*  BILITOT 1.3*    Cardiac Enzymes No results for input(s): TROPONINI in the last 168 hours.  Microbiology Results  Results for orders placed or performed in visit on 11/23/13  Urine culture     Status: None   Collection Time: 11/23/13  7:58 PM  Result Value Ref Range Status   Micro Text Report   Final       SOURCE: CLEAN CATCH    ORGANISM 1                50,000 CFU/ML ESCHERICHIA COLI   ORGANISM 2                5000 CFU GRAM NEGATIVE ROD   COMMENT                   -   ANTIBIOTIC                    ORG#1     AMPICILLIN                    R         CEFAZOLIN                     S         CEFOXITIN                     R         CEFTRIAXONE                   S         CIPROFLOXACIN                 R         ERTAPENEM                     S         GENTAMICIN                    S         IMIPENEM                      S         LEVOFLOXACIN                  R  NITROFURANTOIN                R         TRIMETHOPRIM/SULFAMETHOXAZOLE S             RADIOLOGY:  Ct Head Wo Contrast  01/29/2015   CLINICAL DATA:  Initial evaluation for acute onset bilateral facial and arm numbness, expressive aphasia. History of prior stroke.  EXAM: CT HEAD WITHOUT CONTRAST  TECHNIQUE: Contiguous axial images were obtained from the base of the skull through the vertex without intravenous contrast.  COMPARISON:  Prior study from 10/29/2014  FINDINGS: Diffuse prominence of the CSF containing spaces is compatible with generalized cerebral atrophy. Patchy hypodensity within the periventricular and deep white matter both cerebral hemispheres most consistent with chronic small vessel ischemic disease. Encephalomalacia within the high right frontal  lobe most compatible with remote infarct. Remote right cerebellar infarcts also noted. Remote lacunar infarct present within the right basal ganglia. This is new from prior.  No acute large vessel territory infarct identified. No intracranial hemorrhage. No mass lesion, mass effect, or midline shift. No hydrocephalus. No extra-axial fluid collection.  No acute abnormality about the orbits. Scalp soft tissues within normal limits.  Calvarium intact. Paranasal sinuses and mastoid air cells are clear.  IMPRESSION: 1. No acute intracranial process identified. 2. Remote lacunar infarct within the right basal ganglia. While this is chronic in appearance on today's exam, this is new relative to most recent CT from 10/29/2014. 3. Additional remote right frontal and cerebellar infarcts. 4. Generalized cerebral atrophy with chronic microvascular ischemic disease.   Electronically Signed   By: Jeannine Boga M.D.   On: 01/29/2015 01:07   Mr Brain Wo Contrast  01/29/2015   CLINICAL DATA:  81 year old female with stroke October 15, 2014. New onset bilateral arm weakness and difficulty speaking over the past day. History rheumatoid arthritis, hypertension and chronic myelocytic leukemia. Subsequent encounter.  EXAM: MRI HEAD WITHOUT CONTRAST  TECHNIQUE: Multiplanar, multiecho pulse sequences of the brain and surrounding structures were obtained without intravenous contrast.  COMPARISON:  Several prior CTs most recent 01/29/2015 head CT. No comparison brain MR.  FINDINGS: No acute infarct.  Right frontal lobe broad area of subcortical white matter hyperintensity. This has been noted on several prior exams dating back to 2012 and is most consistent with result prior infarct/ischemia rather than underlying mass.  Remote small infarcts involving centrum semiovale bilaterally. Remote small infarct peripheral aspect right cerebellum. Focal encephalomalacia superior left cerebellum unchanged consistent with remote infarct.   Significant small vessel disease type changes.  Minimal blood breakdown products associated with posterior right frontal lobe infarct and posterior right lenticular nucleus infarct otherwise no evidence of intracranial hemorrhage.  Global atrophy. Ventricular prominence unchanged and felt to be related to atrophy rather than hydrocephalus.  Major intracranial vascular structures are patent.  Hyperostosis frontalis interna incidentally noted. Post lens replacement otherwise orbital structures unremarkable.  Mild transverse ligament hypertrophy. Partially empty sella. Pineal region unremarkable.  Right parotid 6 mm T2 bright lesion of indeterminate etiology.  IMPRESSION: No acute infarct.  Remote infarcts and small vessel disease type changes as noted above.  Atrophy.  Right parotid 6 mm T2 bright lesion of indeterminate etiology   Electronically Signed   By: Genia Del M.D.   On: 01/29/2015 14:43    EKG:   Orders placed or performed during the hospital encounter of 01/29/15  . EKG 12-Lead  . EKG 12-Lead      Management plans discussed with  the patient, family and they are in agreement.  CODE STATUS:     Code Status Orders        Start     Ordered   01/29/15 0528  Full code   Continuous     01/29/15 0527      TOTAL TIME TAKING CARE OF THIS PATIENT: 45 minutes.    @[facility] @  on 01/30/2015 at 3:08 PM  Between 7am to 6pm - Pager - [phone]  After 6pm go to [email or web address] - password [email or web address] [facility]  [facility]  Office  [phone]  CC: Primary care physician; No primary care provider on file.

## 2015-01-31 DIAGNOSIS — N039 Chronic nephritic syndrome with unspecified morphologic changes: Secondary | ICD-10-CM | POA: Diagnosis not present

## 2015-01-31 DIAGNOSIS — N189 Chronic kidney disease, unspecified: Secondary | ICD-10-CM | POA: Diagnosis not present

## 2015-01-31 DIAGNOSIS — Z79899 Other long term (current) drug therapy: Secondary | ICD-10-CM | POA: Diagnosis not present

## 2015-01-31 DIAGNOSIS — D631 Anemia in chronic kidney disease: Secondary | ICD-10-CM | POA: Diagnosis not present

## 2015-01-31 DIAGNOSIS — C91Z0 Other lymphoid leukemia not having achieved remission: Secondary | ICD-10-CM | POA: Diagnosis not present

## 2015-02-14 DIAGNOSIS — N189 Chronic kidney disease, unspecified: Secondary | ICD-10-CM | POA: Diagnosis not present

## 2015-02-14 DIAGNOSIS — Z79899 Other long term (current) drug therapy: Secondary | ICD-10-CM | POA: Diagnosis not present

## 2015-02-14 DIAGNOSIS — D631 Anemia in chronic kidney disease: Secondary | ICD-10-CM | POA: Diagnosis not present

## 2015-02-14 DIAGNOSIS — I129 Hypertensive chronic kidney disease with stage 1 through stage 4 chronic kidney disease, or unspecified chronic kidney disease: Secondary | ICD-10-CM | POA: Diagnosis not present

## 2015-02-14 DIAGNOSIS — C91Z0 Other lymphoid leukemia not having achieved remission: Secondary | ICD-10-CM | POA: Diagnosis not present

## 2015-02-28 DIAGNOSIS — C91Z0 Other lymphoid leukemia not having achieved remission: Secondary | ICD-10-CM | POA: Diagnosis not present

## 2015-02-28 DIAGNOSIS — D631 Anemia in chronic kidney disease: Secondary | ICD-10-CM | POA: Diagnosis not present

## 2015-02-28 DIAGNOSIS — N039 Chronic nephritic syndrome with unspecified morphologic changes: Secondary | ICD-10-CM | POA: Diagnosis not present

## 2015-03-07 DIAGNOSIS — E039 Hypothyroidism, unspecified: Secondary | ICD-10-CM | POA: Diagnosis not present

## 2015-03-07 DIAGNOSIS — G459 Transient cerebral ischemic attack, unspecified: Secondary | ICD-10-CM | POA: Diagnosis not present

## 2015-03-07 DIAGNOSIS — D599 Acquired hemolytic anemia, unspecified: Secondary | ICD-10-CM | POA: Diagnosis not present

## 2015-03-07 DIAGNOSIS — C9510 Chronic leukemia of unspecified cell type not having achieved remission: Secondary | ICD-10-CM | POA: Diagnosis not present

## 2015-03-07 DIAGNOSIS — E119 Type 2 diabetes mellitus without complications: Secondary | ICD-10-CM | POA: Diagnosis not present

## 2015-03-07 DIAGNOSIS — N390 Urinary tract infection, site not specified: Secondary | ICD-10-CM | POA: Diagnosis not present

## 2015-03-07 DIAGNOSIS — M6281 Muscle weakness (generalized): Secondary | ICD-10-CM | POA: Diagnosis not present

## 2015-03-07 DIAGNOSIS — I10 Essential (primary) hypertension: Secondary | ICD-10-CM | POA: Diagnosis not present

## 2015-03-07 DIAGNOSIS — Z0001 Encounter for general adult medical examination with abnormal findings: Secondary | ICD-10-CM | POA: Diagnosis not present

## 2015-03-14 ENCOUNTER — Emergency Department: Payer: Medicare Other

## 2015-03-14 ENCOUNTER — Observation Stay
Admission: EM | Admit: 2015-03-14 | Discharge: 2015-03-15 | Disposition: A | Discharge status: Home / Self Care | Payer: Medicare Other | Attending: Specialist | Admitting: Specialist

## 2015-03-14 ENCOUNTER — Encounter: Payer: Self-pay | Admitting: Emergency Medicine

## 2015-03-14 DIAGNOSIS — Z8249 Family history of ischemic heart disease and other diseases of the circulatory system: Secondary | ICD-10-CM | POA: Insufficient documentation

## 2015-03-14 DIAGNOSIS — R0789 Other chest pain: Secondary | ICD-10-CM | POA: Diagnosis not present

## 2015-03-14 DIAGNOSIS — Z79899 Other long term (current) drug therapy: Secondary | ICD-10-CM | POA: Insufficient documentation

## 2015-03-14 DIAGNOSIS — Z880 Allergy status to penicillin: Secondary | ICD-10-CM | POA: Insufficient documentation

## 2015-03-14 DIAGNOSIS — Z7982 Long term (current) use of aspirin: Secondary | ICD-10-CM | POA: Insufficient documentation

## 2015-03-14 DIAGNOSIS — Z833 Family history of diabetes mellitus: Secondary | ICD-10-CM | POA: Diagnosis not present

## 2015-03-14 DIAGNOSIS — Z90710 Acquired absence of both cervix and uterus: Secondary | ICD-10-CM | POA: Insufficient documentation

## 2015-03-14 DIAGNOSIS — Z955 Presence of coronary angioplasty implant and graft: Secondary | ICD-10-CM | POA: Insufficient documentation

## 2015-03-14 DIAGNOSIS — R079 Chest pain, unspecified: Secondary | ICD-10-CM | POA: Diagnosis not present

## 2015-03-14 DIAGNOSIS — I519 Heart disease, unspecified: Secondary | ICD-10-CM | POA: Diagnosis not present

## 2015-03-14 DIAGNOSIS — C9210 Chronic myeloid leukemia, BCR/ABL-positive, not having achieved remission: Secondary | ICD-10-CM | POA: Diagnosis not present

## 2015-03-14 DIAGNOSIS — Z9049 Acquired absence of other specified parts of digestive tract: Secondary | ICD-10-CM | POA: Insufficient documentation

## 2015-03-14 DIAGNOSIS — I10 Essential (primary) hypertension: Secondary | ICD-10-CM | POA: Diagnosis not present

## 2015-03-14 DIAGNOSIS — Z806 Family history of leukemia: Secondary | ICD-10-CM | POA: Diagnosis not present

## 2015-03-14 DIAGNOSIS — Z8673 Personal history of transient ischemic attack (TIA), and cerebral infarction without residual deficits: Secondary | ICD-10-CM | POA: Diagnosis not present

## 2015-03-14 DIAGNOSIS — M069 Rheumatoid arthritis, unspecified: Secondary | ICD-10-CM | POA: Insufficient documentation

## 2015-03-14 DIAGNOSIS — I2510 Atherosclerotic heart disease of native coronary artery without angina pectoris: Secondary | ICD-10-CM | POA: Diagnosis not present

## 2015-03-14 DIAGNOSIS — E119 Type 2 diabetes mellitus without complications: Secondary | ICD-10-CM | POA: Diagnosis not present

## 2015-03-14 DIAGNOSIS — E039 Hypothyroidism, unspecified: Secondary | ICD-10-CM | POA: Insufficient documentation

## 2015-03-14 DIAGNOSIS — Z794 Long term (current) use of insulin: Secondary | ICD-10-CM | POA: Insufficient documentation

## 2015-03-14 DIAGNOSIS — E118 Type 2 diabetes mellitus with unspecified complications: Secondary | ICD-10-CM | POA: Diagnosis not present

## 2015-03-14 DIAGNOSIS — I25119 Atherosclerotic heart disease of native coronary artery with unspecified angina pectoris: Secondary | ICD-10-CM | POA: Diagnosis not present

## 2015-03-14 LAB — CBC
HCT: 31 % — ABNORMAL LOW (ref 35.0–47.0)
Hemoglobin: 9.5 g/dL — ABNORMAL LOW (ref 12.0–16.0)
MCH: 19.3 pg — ABNORMAL LOW (ref 26.0–34.0)
MCHC: 30.6 g/dL — ABNORMAL LOW (ref 32.0–36.0)
MCV: 63.1 fL — ABNORMAL LOW (ref 80.0–100.0)
Platelets: 92 10*3/uL — ABNORMAL LOW (ref 150–440)
RBC: 4.91 MIL/uL (ref 3.80–5.20)
RDW: 17.2 % — ABNORMAL HIGH (ref 11.5–14.5)
WBC: 6.2 10*3/uL (ref 3.6–11.0)

## 2015-03-14 LAB — BASIC METABOLIC PANEL
Anion gap: 5 (ref 5–15)
BUN: 20 mg/dL (ref 6–20)
CO2: 25 mmol/L (ref 22–32)
Calcium: 9 mg/dL (ref 8.9–10.3)
Chloride: 110 mmol/L (ref 101–111)
Creatinine, Ser: 1.03 mg/dL — ABNORMAL HIGH (ref 0.44–1.00)
GFR calc Af Amer: 57 mL/min — ABNORMAL LOW (ref >60)
GFR calc non Af Amer: 49 mL/min — ABNORMAL LOW (ref >60)
Glucose, Bld: 87 mg/dL (ref 65–99)
Potassium: 3.9 mmol/L (ref 3.5–5.1)
Sodium: 140 mmol/L (ref 135–145)

## 2015-03-14 LAB — TROPONIN I
Troponin I: <0.03 ng/mL (ref <0.031)
Troponin I: <0.03 ng/mL (ref <0.031)
Troponin I: <0.03 ng/mL (ref <0.031)

## 2015-03-14 MED ORDER — NITROGLYCERIN 2 % TD OINT
1.0000 [in_us] | TOPICAL_OINTMENT | Freq: Once | TRANSDERMAL | Status: AC
  Administered 2015-03-14: 1 [in_us] via TOPICAL
  Filled 2015-03-14: qty 1

## 2015-03-14 MED ORDER — CARVEDILOL 6.25 MG PO TABS
6.2500 mg | ORAL_TABLET | Freq: Two times a day (BID) | ORAL | Status: DC
  Administered 2015-03-14 – 2015-03-15 (×2): 6.25 mg via ORAL
  Filled 2015-03-14 (×2): qty 1

## 2015-03-14 MED ORDER — ONDANSETRON HCL 4 MG/2ML IJ SOLN
4.0000 mg | Freq: Four times a day (QID) | INTRAMUSCULAR | Status: DC | PRN
  Administered 2015-03-15: 4 mg via INTRAVENOUS
  Filled 2015-03-14: qty 2

## 2015-03-14 MED ORDER — VITAMIN C 500 MG PO TABS
1000.0000 mg | ORAL_TABLET | Freq: Every day | ORAL | Status: DC
  Administered 2015-03-15: 1000 mg via ORAL
  Filled 2015-03-14: qty 2

## 2015-03-14 MED ORDER — FAMOTIDINE 20 MG PO TABS
20.0000 mg | ORAL_TABLET | Freq: Every day | ORAL | Status: DC
  Administered 2015-03-15: 20 mg via ORAL
  Filled 2015-03-14: qty 1

## 2015-03-14 MED ORDER — ENOXAPARIN SODIUM 40 MG/0.4ML ~~LOC~~ SOLN
40.0000 mg | SUBCUTANEOUS | Status: DC
  Administered 2015-03-14: 40 mg via SUBCUTANEOUS
  Filled 2015-03-14: qty 0.4

## 2015-03-14 MED ORDER — VITAMIN E 180 MG (400 UNIT) PO CAPS
400.0000 [IU] | ORAL_CAPSULE | Freq: Every day | ORAL | Status: DC
  Administered 2015-03-15: 400 [IU] via ORAL
  Filled 2015-03-14: qty 1

## 2015-03-14 MED ORDER — ACETAMINOPHEN 325 MG PO TABS
650.0000 mg | ORAL_TABLET | Freq: Four times a day (QID) | ORAL | Status: DC | PRN
  Administered 2015-03-15 (×2): 650 mg via ORAL
  Filled 2015-03-14 (×2): qty 2

## 2015-03-14 MED ORDER — LEVOTHYROXINE SODIUM 75 MCG PO TABS
75.0000 ug | ORAL_TABLET | ORAL | Status: DC
  Administered 2015-03-14: 75 ug via ORAL

## 2015-03-14 MED ORDER — LEVOTHYROXINE SODIUM 50 MCG PO TABS: 50.0000 ug | ORAL_TABLET | ORAL | Status: DC

## 2015-03-14 MED ORDER — SODIUM CHLORIDE 0.9 % IV SOLN
INTRAVENOUS | Status: DC
  Administered 2015-03-14: 13:00:00 via INTRAVENOUS

## 2015-03-14 MED ORDER — ASPIRIN 325 MG PO TABS
325.0000 mg | ORAL_TABLET | Freq: Every day | ORAL | Status: DC
  Administered 2015-03-15: 325 mg via ORAL
  Filled 2015-03-14: qty 1

## 2015-03-14 MED ORDER — LEVOTHYROXINE SODIUM 75 MCG PO TABS
75.0000 ug | ORAL_TABLET | Freq: Every day | ORAL | Status: DC
  Administered 2015-03-15: 75 ug via ORAL
  Filled 2015-03-14: qty 1

## 2015-03-14 MED ORDER — ONDANSETRON HCL 4 MG PO TABS: 4.0000 mg | ORAL_TABLET | Freq: Four times a day (QID) | ORAL | Status: DC | PRN

## 2015-03-14 MED ORDER — AMLODIPINE BESYLATE 5 MG PO TABS
5.0000 mg | ORAL_TABLET | Freq: Every day | ORAL | Status: DC
  Filled 2015-03-14: qty 1

## 2015-03-14 MED ORDER — SIMVASTATIN 10 MG PO TABS
5.0000 mg | ORAL_TABLET | Freq: Every day | ORAL | Status: DC
  Administered 2015-03-14: 5 mg via ORAL
  Filled 2015-03-14: qty 1

## 2015-03-14 MED ORDER — ASPIRIN EC 325 MG PO TBEC: 325.0000 mg | DELAYED_RELEASE_TABLET | Freq: Every day | ORAL | Status: DC

## 2015-03-14 MED ORDER — FOLIC ACID 1 MG PO TABS
1.0000 mg | ORAL_TABLET | Freq: Every day | ORAL | Status: DC
  Administered 2015-03-15: 1 mg via ORAL
  Filled 2015-03-14: qty 1

## 2015-03-14 MED ORDER — AMLODIPINE BESYLATE 5 MG PO TABS
5.0000 mg | ORAL_TABLET | Freq: Every day | ORAL | Status: DC
  Administered 2015-03-14: 5 mg via ORAL
  Filled 2015-03-14: qty 1

## 2015-03-14 MED ORDER — VITAMIN D 1000 UNITS PO TABS
1000.0000 [IU] | ORAL_TABLET | Freq: Every day | ORAL | Status: DC
  Administered 2015-03-15: 1000 [IU] via ORAL
  Filled 2015-03-14: qty 1

## 2015-03-14 MED ORDER — ACETAMINOPHEN 650 MG RE SUPP: 650.0000 mg | Freq: Four times a day (QID) | RECTAL | Status: DC | PRN

## 2015-03-14 MED ORDER — SENNOSIDES-DOCUSATE SODIUM 8.6-50 MG PO TABS: 1.0000 | ORAL_TABLET | Freq: Every evening | ORAL | Status: DC | PRN

## 2015-03-14 MED ORDER — ASPIRIN 81 MG PO CHEW
324.0000 mg | CHEWABLE_TABLET | Freq: Once | ORAL | Status: AC
  Administered 2015-03-14: 324 mg via ORAL
  Filled 2015-03-14: qty 4

## 2015-03-14 MED ORDER — ALUM & MAG HYDROXIDE-SIMETH 200-200-20 MG/5ML PO SUSP: 30.0000 mL | Freq: Four times a day (QID) | ORAL | Status: DC | PRN

## 2015-03-14 MED ORDER — MORPHINE SULFATE 2 MG/ML IJ SOLN
2.0000 mg | INTRAMUSCULAR | Status: DC | PRN
  Administered 2015-03-14: 2 mg via INTRAVENOUS
  Filled 2015-03-14: qty 1

## 2015-03-14 NOTE — H&P (Signed)
[facility] [place] NAME: Stephanie Harris    MR#:  [id number]  DATE OF BIRTH:  [birth year]  DATE OF ADMISSION:  03/14/2015  PRIMARY CARE PHYSICIAN: Dr. Clayborn Bigness REQUESTING/REFERRING PHYSICIAN: Dr. Lovena Le  CHIEF COMPLAINT:  Chest tightness  HISTORY OF PRESENT ILLNESS:  Stephanie Harris  is a 82 y.o. female with a known history of coronary artery disease status post stent sees Dr. Nehemiah Massed as an outpatient is the presenting to the ED with a chief complaint of chest tightness since 8 AM today. Patient was in his good health until last night. Today morning she woke up with nausea and subsequently she started having chest tightness radiating to the left shoulder it was constant pain with no radiation. Denies any shortness of breath. Patient was seen and evaluated by Dr. Nehemiah Massed recently on December 29 of 2015 and had stress test and echocardiogram which was normal as reported by the patient. Patient also reported that lately her blood pressure was running high, systolic is around 480X. Patient was seen by her primary care physician Dr. Humphrey Rolls regarding elevated blood pressure and she was started on Norvasc recently. EMS gave her 4 baby aspirin on her way to the emergency department. Patient was still having chest tightness radiating to the left shoulder and Nitropaste was placed to the anterior chest wall in the ED. Patient was feeling little relief of her Nitropaste placement but not completely. Patient was feeling hungry during my examination as she did not have any food from yesterday evening  PAST MEDICAL HISTORY:   Past Medical History  Diagnosis Date  . Hypertension   . Stroke   . RA (rheumatoid arthritis)   . Heart disease   . CML (chronic myelocytic leukemia)     Onc at [name]  Diabetes mellitus  PAST SURGICAL HISTOIRY:   Past Surgical History  Procedure Laterality Date  . Abdominal hysterectomy    . Cholecystectomy    . Total knee  arthroplasty Right   . Rotator cuff repair Left   . Cataract extraction    . Percutaneous coronary stent intervention (pci-s)    . Carotid endarterectomy Left     SOCIAL HISTORY:   History  Substance Use Topics  . Smoking status: Never Smoker   . Smokeless tobacco: Not on file  . Alcohol Use: No  Denies any illicit drug usage  FAMILY HISTORY:   Family History  Problem Relation Age of Onset  . Acute myelogenous leukemia Brother   Diabetes mellitus and hypertension  DRUG ALLERGIES:   Allergies  Allergen Reactions  . Penicillins Other (See Comments)    Reaction: pt unsure. Chart had Hives listed.    REVIEW OF SYSTEMS:  CONSTITUTIONAL: No fever, fatigue or weakness.  EYES: No blurred or double vision.  EARS, NOSE, AND THROAT: No tinnitus or ear pain.  RESPIRATORY: No cough, shortness of breath, wheezing or hemoptysis.  CARDIOVASCULAR: Reporting chest tightness with radiation to left shoulder but denies orthopnea, edema. GASTROINTESTINAL: No nausea, vomiting, diarrhea or abdominal pain.  GENITOURINARY: No dysuria, hematuria.  ENDOCRINE: No polyuria, nocturia,  HEMATOLOGY: No anemia, easy bruising or bleeding SKIN: No rash or lesion. MUSCULOSKELETAL: No joint pain or arthritis.   NEUROLOGIC: No tingling, numbness, weakness.  PSYCHIATRY: No anxiety or depression.   MEDICATIONS AT HOME:   Prior to Admission medications   Medication Sig Start Date End Date Taking? Authorizing Provider  amLODipine (NORVASC) 5 MG tablet Take 1 tablet by mouth daily.  Yes Historical Provider, MD  ascorbic acid (VITAMIN C) 1000 MG tablet Take 1,000 mg by mouth daily.   Yes Historical Provider, MD  aspirin EC 325 MG EC tablet Take 1 tablet (325 mg total) by mouth daily. 01/30/15  Yes [name], MD  carvedilol (COREG) 3.125 MG tablet Take 6.25 mg by mouth 2 (two) times daily.    Yes Historical Provider, MD  cholecalciferol (VITAMIN D) 1000 UNITS tablet Take 1,000 Units by mouth daily.   Yes  Historical Provider, MD  folic acid (FOLVITE) 1 MG tablet Take 1 mg by mouth daily.   Yes Historical Provider, MD  glimepiride (AMARYL) 1 MG tablet Take 1 tablet by mouth daily with supper.   Yes Historical Provider, MD  levothyroxine (SYNTHROID, LEVOTHROID) 50 MCG tablet Take 50 mcg by mouth every other day. On the opposite days from taking 29mcg.   Yes Historical Provider, MD  levothyroxine (SYNTHROID, LEVOTHROID) 75 MCG tablet Take 75 mcg by mouth every other day. On the opposite days from taking 51mcg   Yes Historical Provider, MD  saccharomyces boulardii (FLORASTOR) 250 MG capsule Take 1 capsule (250 mg total) by mouth 2 (two) times daily. Patient taking differently: Take 250 mg by mouth as needed.  01/30/15  Yes [name], MD  vitamin E 400 UNIT capsule Take 400 Units by mouth daily.   Yes Historical Provider, MD      VITAL SIGNS:  Blood pressure 145/55, pulse 73, temperature 97.6 F (36.4 C), temperature source Oral, resp. rate 17, height 5' (1.524 m), weight 58.968 kg (130 lb), SpO2 100 %.  PHYSICAL EXAMINATION:  GENERAL:  81 y.o.-year-old patient lying in the bed with no acute distress.  EYES: Pupils equal, round, reactive to light and accommodation. No scleral icterus. Extraocular muscles intact.  HEENT: Head atraumatic, normocephalic. Oropharynx and nasopharynx clear.  NECK:  Supple, no jugular venous distention. No thyroid enlargement, no tenderness.  LUNGS: Normal breath sounds bilaterally, no wheezing, rales,rhonchi or crepitation. No use of accessory muscles of respiration.  CARDIOVASCULAR: S1, S2 normal. No murmurs, rubs, or gallops.  No reproducible chest wall tenderness  ABDOMEN: Soft, nontender, nondistended. Bowel sounds present. No organomegaly or mass.  EXTREMITIES: No pedal edema, cyanosis, or clubbing.  NEUROLOGIC: Cranial nerves II through XII are intact. Muscle strength 5/5 in all extremities. Sensation intact. Gait not checked.  PSYCHIATRIC: The patient is alert  and oriented x 3.  SKIN: No obvious rash, lesion, or ulcer.   LABORATORY PANEL:   CBC  Recent Labs Lab 03/14/15 1100  WBC 6.2  HGB 9.5*  HCT 31.0*  PLT 92*   ------------------------------------------------------------------------------------------------------------------  Chemistries   Recent Labs Lab 03/14/15 1100  NA 140  K 3.9  CL 110  CO2 25  GLUCOSE 87  BUN 20  CREATININE 1.03*  CALCIUM 9.0   ------------------------------------------------------------------------------------------------------------------  Cardiac Enzymes  Recent Labs Lab 03/14/15 1100  TROPONINI <0.03   ------------------------------------------------------------------------------------------------------------------  RADIOLOGY:  Dg Chest 2 View  03/14/2015   CLINICAL DATA:  Chest tightness.  EXAM: CHEST  2 VIEW  COMPARISON:  Chest x-ray dated 10/15/2014.  FINDINGS: Cardiomediastinal silhouette is normal in size and configuration. Lungs are clear. Lung volumes are normal. No pleural effusion. No pneumothorax.  Foreshortening of the distal left clavicle again noted and presumably postsurgical and/or related to previous trauma. Chronic compression fracture deformity of a lower thoracic vertebral body also again noted. No acute osseous abnormality seen.  IMPRESSION: No evidence of acute cardiopulmonary abnormality. Heart size is normal. Lungs are  clear.  Chronic/ incidental findings detailed above.   Electronically Signed   By: Franki Cabot M.D.   On: 03/14/2015 11:20    EKG:   Orders placed or performed during the hospital encounter of 03/14/15  . ED EKG within 10 minutes  . ED EKG within 10 minutes    IMPRESSION AND PLAN:   Ms. Lindenbaum is a 81 year old pleasant Caucasian female presenting to the ED with a chief complaint of chest tightness started today at 8 AM, radiating to left shoulder and associated with nausea. Patient has history of coronary artery disease and recent stress test in  December 2015 was normal. Patient sees Dr. Nehemiah Massed as an outpatient.  #1 chest tightness with history of coronary artery disease and one cardiac stent   Will admit the patient to telemetry Cycle cardiac biomarkers Monitor patient closely on telemetry Cardiology consult is placed to Dr. Nehemiah Massed Will obtain an echocardiogram Will provide nitro paste as needed basis Will continue aspirin, Coreg and statin. Check fasting lipid panel in a.m.  #2 hypertension with elevated blood pressure Will continue her home medication Coreg and amlodipine and titrate as needed basis Amlodipine was just recently started by her primary care physician for elevated blood pressure, patient is reporting pedal edema but I don't see anything at this time. Will continue close monitoring for pedal edema, and if needed will discontinue amlodipine  #3 diabetes mellitus Patient will be provided with a diabetic diet and sliding scale insulin  #4 history of hypothyroidism Will continue Synthroid, will alternate 75 g and 50 g as she was taking at home  #5 history of stroke with no deficits Currently asymptomatic. Continue aspirin and statin.  Will provide GI prophylaxis with Pepcid and DVT prophylaxis with Lovenox subcutaneous     All the records are reviewed and case discussed with ED provider. Management plans discussed with the patient, family and they are in agreement.  CODE STATUS: Full code, husband is the healthcare power of attorney   TOTAL TIME TAKING CARE OF THIS PATIENT, reviewing chart, performing history and physical, discussing with the patient and her husband, discussing with the RN and ED physician , admitting orders and coordination of care -45 minutes.    [name] M.D on 03/14/2015 at 1:18 PM  Between 7am to 6pm - Pager - [phone]  After 6pm go to [email or web address] - password [email or web address] [facility]  [facility]  Office  [phone]  CC: Primary care physician;  Dr. Clayborn Bigness

## 2015-03-14 NOTE — Consult Note (Signed)
[facility] Cardiology  CARDIOLOGY CONSULT NOTE  Patient ID: Stephanie Harris MRN: [id number] DOB/AGE: [birth year] 81 y.o.  Admit date: 03/14/2015 Referring Physician [place] Primary Physician Clayborn Bigness Primary Cardiologist Nehemiah Massed Reason for Consultation chest pain  HPI: 81 year old female with known history of coronary artery disease referred for evaluation chest pain. The patient is status post coronary stent 3-4 years ago. The patient has been in her usual state of health until early today when she experienced nausea with chest discomfort radiation to her left shoulder. She presented to [facility] emergency room. EKG was nondiagnostic. The patient is ruling out for myocardial infarction by CPK isoenzymes and troponin.  Review of systems complete and found to be negative unless listed above     Past Medical History  Diagnosis Date  . Hypertension   . Stroke   . RA (rheumatoid arthritis)   . Heart disease   . CML (chronic myelocytic leukemia)     Onc at [facility]    Past Surgical History  Procedure Laterality Date  . Abdominal hysterectomy    . Cholecystectomy    . Total knee arthroplasty Right   . Rotator cuff repair Left   . Cataract extraction    . Percutaneous coronary stent intervention (pci-s)    . Carotid endarterectomy Left     Prescriptions prior to admission  Medication Sig Dispense Refill Last Dose  . amLODipine (NORVASC) 5 MG tablet Take 1 tablet by mouth daily.   03/13/2015 at pm  . ascorbic acid (VITAMIN C) 1000 MG tablet Take 1,000 mg by mouth daily.   03/13/2015 at am  . aspirin EC 325 MG EC tablet Take 1 tablet (325 mg total) by mouth daily. 30 tablet 0 03/14/2015 at am  . carvedilol (COREG) 3.125 MG tablet Take 6.25 mg by mouth 2 (two) times daily.    03/14/2015 at Unknown time  . cholecalciferol (VITAMIN D) 1000 UNITS tablet Take 1,000 Units by mouth daily.   02/07/736 at am  . folic acid (FOLVITE) 1 MG tablet Take 1 mg by mouth daily.   03/13/2015 at am  . glimepiride (AMARYL) 1  MG tablet Take 1 tablet by mouth daily with supper.   03/13/2015 at pm  . levothyroxine (SYNTHROID, LEVOTHROID) 50 MCG tablet Take 50 mcg by mouth every other day. On the opposite days from taking 35mcg.   03/06/2015  . levothyroxine (SYNTHROID, LEVOTHROID) 75 MCG tablet Take 75 mcg by mouth every other day. On the opposite days from taking 53mcg   03/13/2015 at am  . saccharomyces boulardii (FLORASTOR) 250 MG capsule Take 1 capsule (250 mg total) by mouth 2 (two) times daily. (Patient taking differently: Take 250 mg by mouth as needed. ) 30 capsule 0 Past Week at Unknown time  . vitamin E 400 UNIT capsule Take 400 Units by mouth daily.   03/13/2015 at am   History   Social History  . Marital Status: Married    Spouse Name: N/A  . Number of Children: N/A  . Years of Education: N/A   Occupational History  . Not on file.   Social History Main Topics  . Smoking status: Never Smoker   . Smokeless tobacco: Not on file  . Alcohol Use: No  . Drug Use: Not on file  . Sexual Activity: Not on file   Other Topics Concern  . Not on file   Social History Narrative    Family History  Problem Relation Age of Onset  . Acute myelogenous leukemia Brother  Review of systems complete and found to be negative unless listed above      PHYSICAL EXAM  General: Well developed, well nourished, in no acute distress HEENT:  Normocephalic and atramatic Neck:  No JVD.  Lungs: Clear bilaterally to auscultation and percussion. Heart: HRRR . Normal S1 and S2 without gallops or murmurs.  Abdomen: Bowel sounds are positive, abdomen soft and non-tender  Msk:  Back normal, normal gait. Normal strength and tone for age. Extremities: No clubbing, cyanosis or edema.   Neuro: Alert and oriented X 3. Psych:  Good affect, responds appropriately  Labs:   Lab Results  Component Value Date   WBC 6.2 03/14/2015   HGB 9.5* 03/14/2015   HCT 31.0* 03/14/2015   MCV 63.1* 03/14/2015   PLT 92* 03/14/2015     Recent Labs Lab 03/14/15 1100  NA 140  K 3.9  CL 110  CO2 25  BUN 20  CREATININE 1.03*  CALCIUM 9.0  GLUCOSE 87   Lab Results  Component Value Date   TROPONINI <0.03 03/14/2015    Lab Results  Component Value Date   CHOL 138 01/30/2015   Lab Results  Component Value Date   HDL 45 01/30/2015   Lab Results  Component Value Date   LDLCALC 79 01/30/2015   Lab Results  Component Value Date   TRIG 69 01/30/2015   Lab Results  Component Value Date   CHOLHDL 3.1 01/30/2015   No results found for: LDLDIRECT    Radiology: Dg Chest 2 View  03/14/2015   CLINICAL DATA:  Chest tightness.  EXAM: CHEST  2 VIEW  COMPARISON:  Chest x-ray dated 10/15/2014.  FINDINGS: Cardiomediastinal silhouette is normal in size and configuration. Lungs are clear. Lung volumes are normal. No pleural effusion. No pneumothorax.  Foreshortening of the distal left clavicle again noted and presumably postsurgical and/or related to previous trauma. Chronic compression fracture deformity of a lower thoracic vertebral body also again noted. No acute osseous abnormality seen.  IMPRESSION: No evidence of acute cardiopulmonary abnormality. Heart size is normal. Lungs are clear.  Chronic/ incidental findings detailed above.   Electronically Signed   By: Franki Cabot M.D.   On: 03/14/2015 11:20    EKG: Normal sinus rhythm  ASSESSMENT AND PLAN:   81 year old female with known coronary artery disease, status post prior stent. The patient has had 2 recent CVAs, 1/16 and 6/16. She presents with chest pain with atypical features, has ruled out for myocardial infarction by CPK isoenzymes and troponin.  Recommendations  1. Continue current medications 2. Defer full dose anticoagulation at this time 3. Lexiscan sestamibi study in the a.m.  SignedIsaias Cowman MD,PhD, [facility] 03/14/2015, 1:49 PM

## 2015-03-14 NOTE — Care Management (Signed)
Presents form home with chest pain.  Has history of CABG.  Troponins negative.  Lexiscan 7/22.

## 2015-03-14 NOTE — ED Provider Notes (Signed)
[facility] Emergency Department Provider Note ___________________________________________  Time seen: Approximately 10:50 AM  I have reviewed the triage vital signs and the nursing notes.   HISTORY  Chief Complaint Chest Pain  HPI Stephanie Harris is a 81 y.o. female who is complaining that she had pressure across her chest this morning that radiated somewhat into her back. Patient states that it was more like a tightness and she still having a little bit of tightness at this time. Patient was concerned because she has history of having open heart surgery 3 years ago and has had a cardiac stent. Patient also is currently still receiving injections for her leukemia and has carotid artery disease. Patient states that it was unusual this morning also that she woke up with nausea. Patient denies any vomiting or diarrhea. Patient denies any recent fever, chills, cough, congestion or urinary symptoms. Patient states that her pressure on a scale of 0-10 is about a 2.   Past Medical History  Diagnosis Date  . Hypertension   . Stroke   . RA (rheumatoid arthritis)   . Heart disease   . CML (chronic myelocytic leukemia)     Onc at [facility]    Patient Active Problem List   Diagnosis Date Noted  . TIA (transient ischemic attack) 01/29/2015  . Allergic arthritis, hand     Past Surgical History  Procedure Laterality Date  . Abdominal hysterectomy    . Cholecystectomy    . Total knee arthroplasty Right   . Rotator cuff repair Left   . Cataract extraction    . Percutaneous coronary stent intervention (pci-s)    . Carotid endarterectomy Left     Current Outpatient Rx  Name  Route  Sig  Dispense  Refill  . amLODipine (NORVASC) 5 MG tablet   Oral   Take 1 tablet by mouth daily.         Marland Kitchen ascorbic acid (VITAMIN C) 1000 MG tablet   Oral   Take 1,000 mg by mouth daily.         Marland Kitchen aspirin EC 325 MG EC tablet   Oral   Take 1 tablet (325 mg total) by mouth daily.    30 tablet   0   . carvedilol (COREG) 3.125 MG tablet   Oral   Take 6.25 mg by mouth 2 (two) times daily.          . cholecalciferol (VITAMIN D) 1000 UNITS tablet   Oral   Take 1,000 Units by mouth daily.         . folic acid (FOLVITE) 1 MG tablet   Oral   Take 1 mg by mouth daily.         Marland Kitchen glimepiride (AMARYL) 1 MG tablet   Oral   Take 1 tablet by mouth daily with supper.         . levothyroxine (SYNTHROID, LEVOTHROID) 50 MCG tablet   Oral   Take 50 mcg by mouth every other day. On the opposite days from taking 29mcg.         Marland Kitchen levothyroxine (SYNTHROID, LEVOTHROID) 75 MCG tablet   Oral   Take 75 mcg by mouth every other day. On the opposite days from taking 20mcg         . saccharomyces boulardii (FLORASTOR) 250 MG capsule   Oral   Take 1 capsule (250 mg total) by mouth 2 (two) times daily. Patient taking differently: Take 250 mg by mouth as needed.  30 capsule   0   . vitamin E 400 UNIT capsule   Oral   Take 400 Units by mouth daily.           Allergies Penicillins  Family History  Problem Relation Age of Onset  . Acute myelogenous leukemia Brother     Social History History  Substance Use Topics  . Smoking status: Never Smoker   . Smokeless tobacco: Not on file  . Alcohol Use: No    Review of Systems Constitutional: No fever/chills Eyes: No visual changes. ENT: No sore throat. Cardiovascular patient complaining of tightness across her chest that radiates into her back off and on this morning. Respiratory: Denies shortness of breath. Gastrointestinal: No abdominal pain. Patient is extremely nauseated, no vomiting.  No diarrhea.  No constipation. Genitourinary: Negative for dysuria. Musculoskeletal: Negative for back pain. Skin: Negative for rash. Neurological: Negative for headaches, focal weakness or numbness. 10-point ROS otherwise negative.  ____________________________________________   PHYSICAL EXAM:  VITAL SIGNS: ED  Triage Vitals  Enc Vitals Group     BP 03/14/15 1030 144/73 mmHg     Pulse Rate 03/14/15 1030 80     Resp 03/14/15 1030 20     Temp 03/14/15 1030 97 F (36.1 C)     Temp Source 03/14/15 1030 Oral     SpO2 03/14/15 1030 99 %     Weight 03/14/15 1030 130 lb (58.968 kg)     Height 03/14/15 1030 5' (1.524 m)     Head Cir --      Peak Flow --      Pain Score 03/14/15 1031 7     Pain Loc --      Pain Edu? --      Excl. in Central? --      Constitutional: Alert and oriented. Well appearing and in no acute distress. Eyes: Conjunctivae are normal. PERRL. EOMI. Head: Atraumatic. Nose: No congestion/rhinnorhea. Mouth/Throat: Mucous membranes are moist.  Oropharynx non-erythematous. Neck: No stridor.   Cardiovascular: Normal rate, regular rhythm. Grossly normal heart sounds.  Good peripheral circulation. Patient has no palpable chest wall tenderness. Respiratory: Normal respiratory effort.  No retractions. Lungs CTAB. Gastrointestinal: Soft and nontender. No distention. No abdominal bruits. No CVA tenderness. musculoskeletal: No lower extremity tenderness nor edema.  No joint effusions. Neurologic:  Normal speech and language. No gross focal neurologic deficits are appreciated. No gait instability. Skin:  Skin is warm, dry and intact. No rash noted. Psychiatric: Mood and affect are normal. Speech and behavior are normal.  ____________________________________________   LABS (all labs ordered are listed, but only abnormal results are displayed)  Labs Reviewed  BASIC METABOLIC PANEL - Abnormal; Notable for the following:    Creatinine, Ser 1.03 (*)    GFR calc non Af Amer 49 (*)    GFR calc Af Amer 57 (*)    All other components within normal limits  CBC - Abnormal; Notable for the following:    Hemoglobin 9.5 (*)    HCT 31.0 (*)    MCV 63.1 (*)    MCH 19.3 (*)    MCHC 30.6 (*)    RDW 17.2 (*)    Platelets 92 (*)    All other components within normal limits  TROPONIN I    ____________________________________________  EKG  ED ECG REPORT I, Ruby Cola, the attending physician, personally viewed and interpreted this ECG.   Date: 03/14/2015  EKG Time: 10:27 AM  Rate: 73  Rhythm: normal EKG,  normal sinus rhythm, unchanged from previous tracings, normal sinus rhythm  Axis: Normal  Intervals:none  ST&T Change: None _______________________________  RADIOLOGY  Dg Chest 2 View  03/14/2015   CLINICAL DATA:  Chest tightness.  EXAM: CHEST  2 VIEW  COMPARISON:  Chest x-ray dated 10/15/2014.  FINDINGS: Cardiomediastinal silhouette is normal in size and configuration. Lungs are clear. Lung volumes are normal. No pleural effusion. No pneumothorax.  Foreshortening of the distal left clavicle again noted and presumably postsurgical and/or related to previous trauma. Chronic compression fracture deformity of a lower thoracic vertebral body also again noted. No acute osseous abnormality seen.  IMPRESSION: No evidence of acute cardiopulmonary abnormality. Heart size is normal. Lungs are clear.  Chronic/ incidental findings detailed above.   Electronically Signed   By: Franki Cabot M.D.   On: 03/14/2015 11:20    ____________________________________________   PROCEDURES  Procedure(s) performed: None  Critical Care performed: No  ____________________________________________   INITIAL IMPRESSION / ASSESSMENT AND PLAN / ED COURSE  Pertinent labs & imaging results that were available during my care of the patient were reviewed by me and considered in my medical decision making (see chart for details). ----------------------------------------- 12:05 PM on 03/14/2015 -----------------------------------------  Patient had aspirin and oxygen and Nitropaste placed. EKG and initial cardiac enzymes were negative. Dr.Gouru hospitalist is going to admit the patient. ____________________________________________   FINAL CLINICAL IMPRESSION(S) / ED DIAGNOSES  Final  diagnoses:  Acute chest pain      Ruby Cola, MD 03/14/15 1208

## 2015-03-14 NOTE — ED Notes (Signed)
Brought in via ems from home with some chest tightness. States she developed some tight ness to mid chest about 8a

## 2015-03-15 ENCOUNTER — Encounter: Payer: Medicare Other | Attending: Cardiology

## 2015-03-15 DIAGNOSIS — E118 Type 2 diabetes mellitus with unspecified complications: Secondary | ICD-10-CM | POA: Diagnosis not present

## 2015-03-15 DIAGNOSIS — R079 Chest pain, unspecified: Secondary | ICD-10-CM | POA: Diagnosis not present

## 2015-03-15 DIAGNOSIS — I10 Essential (primary) hypertension: Secondary | ICD-10-CM | POA: Diagnosis not present

## 2015-03-15 DIAGNOSIS — I25119 Atherosclerotic heart disease of native coronary artery with unspecified angina pectoris: Secondary | ICD-10-CM | POA: Diagnosis not present

## 2015-03-15 LAB — COMPREHENSIVE METABOLIC PANEL
ALT: 14 U/L (ref 14–54)
AST: 18 U/L (ref 15–41)
Albumin: 3.1 g/dL — ABNORMAL LOW (ref 3.5–5.0)
Alkaline Phosphatase: 23 U/L — ABNORMAL LOW (ref 38–126)
Anion gap: 6 (ref 5–15)
BUN: 20 mg/dL (ref 6–20)
CO2: 24 mmol/L (ref 22–32)
Calcium: 8.6 mg/dL — ABNORMAL LOW (ref 8.9–10.3)
Chloride: 111 mmol/L (ref 101–111)
Creatinine, Ser: 1.02 mg/dL — ABNORMAL HIGH (ref 0.44–1.00)
GFR calc Af Amer: 58 mL/min — ABNORMAL LOW (ref >60)
GFR calc non Af Amer: 50 mL/min — ABNORMAL LOW (ref >60)
Glucose, Bld: 96 mg/dL (ref 65–99)
Potassium: 3.8 mmol/L (ref 3.5–5.1)
Sodium: 141 mmol/L (ref 135–145)
Total Bilirubin: 1.1 mg/dL (ref 0.3–1.2)
Total Protein: 6.2 g/dL — ABNORMAL LOW (ref 6.5–8.1)

## 2015-03-15 LAB — CBC
HCT: 28.4 % — ABNORMAL LOW (ref 35.0–47.0)
Hemoglobin: 8.7 g/dL — ABNORMAL LOW (ref 12.0–16.0)
MCH: 19.3 pg — ABNORMAL LOW (ref 26.0–34.0)
MCHC: 30.6 g/dL — ABNORMAL LOW (ref 32.0–36.0)
MCV: 63.2 fL — ABNORMAL LOW (ref 80.0–100.0)
Platelets: 96 10*3/uL — ABNORMAL LOW (ref 150–440)
RBC: 4.49 MIL/uL (ref 3.80–5.20)
RDW: 17.1 % — ABNORMAL HIGH (ref 11.5–14.5)
WBC: 5.8 10*3/uL (ref 3.6–11.0)

## 2015-03-15 LAB — TSH: TSH: 0.968 u[IU]/mL (ref 0.350–4.500)

## 2015-03-15 LAB — LIPID PANEL
Cholesterol: 119 mg/dL (ref 0–200)
HDL: 38 mg/dL — ABNORMAL LOW (ref >40)
LDL Cholesterol: 67 mg/dL (ref 0–99)
Total CHOL/HDL Ratio: 3.1 RATIO
Triglycerides: 72 mg/dL (ref <150)
VLDL: 14 mg/dL (ref 0–40)

## 2015-03-15 LAB — NM MYOCAR MULTI W/SPECT W/WALL MOTION / EF
LV dias vol: 42 mL
LV sys vol: 7 mL
SDS: 8
SRS: 0
SSS: 4
TID: 0.95

## 2015-03-15 LAB — HEMOGLOBIN A1C: Hgb A1c MFr Bld: 5.4 % (ref 4.0–6.0)

## 2015-03-15 MED ORDER — ZOLPIDEM TARTRATE 5 MG PO TABS
5.0000 mg | ORAL_TABLET | Freq: Every evening | ORAL | Status: DC | PRN
  Administered 2015-03-15: 5 mg via ORAL
  Filled 2015-03-15: qty 1

## 2015-03-15 MED ORDER — TECHNETIUM TC 99M SESTAMIBI - CARDIOLITE
30.0000 | Freq: Once | INTRAVENOUS | Status: AC | PRN
  Administered 2015-03-15: 32.12 via INTRAVENOUS

## 2015-03-15 MED ORDER — TECHNETIUM TC 99M SESTAMIBI - CARDIOLITE
13.0000 | Freq: Once | INTRAVENOUS | Status: AC | PRN
  Administered 2015-03-15: 09:00:00 13.965 via INTRAVENOUS

## 2015-03-15 MED ORDER — REGADENOSON 0.4 MG/5ML IV SOLN
0.4000 mg | Freq: Once | INTRAVENOUS | Status: AC
  Administered 2015-03-15: 0.4 mg via INTRAVENOUS

## 2015-03-15 NOTE — Discharge Summary (Signed)
Stephanie Harris at [place] NAME: Stephanie Harris    MR#:  [id number]  DATE OF BIRTH:  27-Mar-1931  DATE OF ADMISSION:  03/14/2015 ADMITTING PHYSICIAN: [name], MD  DATE OF DISCHARGE: 03/15/2015  3:07 PM  PRIMARY CARE PHYSICIAN: [name], MD    ADMISSION DIAGNOSIS:  Acute chest pain [R07.9]  DISCHARGE DIAGNOSIS:  Active Problems:   Chest pain   SECONDARY DIAGNOSIS:   Past Medical History  Diagnosis Date  . Hypertension   . Stroke   . RA (rheumatoid arthritis)   . Heart disease   . CML (chronic myelocytic leukemia)     Onc at [facility]:   81 year old female with past medical history of hypertension, history of previous CVA, history of coronary disease status post stent placement, history of CML, rheumatoid arthritis who presented to the hospital with chest pain.  #1 chest pain-given patient's history of previous coronary disease and stent placement patient was observed overnight on telemetry and had 3 sets of cardiac markers checked which were negative. -Patient also underwent a nuclear medicine stress test which showed no evidence of myocardial ischemia with normal ejection fraction. -Patient is currently chest pain-free and hemodynamically therefore being discharged home.  #2 hypothyroidism-patient was maintained on her Synthroid she will resume that.  #3 history of CML-patient is followed at [facility] and will continue follow-up there.  #4 hypertension-patient remained hemodynamically stable. Continue Coreg, Norvasc  #5 diabetes-continue Glimeperide.   DISCHARGE CONDITIONS:   Stable  CONSULTS OBTAINED:  Treatment Team:  [name], MD Isaias Cowman, MD  DRUG ALLERGIES:   Allergies  Allergen Reactions  . Penicillins Other (See Comments)    Reaction: pt unsure. Chart had Hives listed.    DISCHARGE MEDICATIONS:   Discharge Medication List as of 03/15/2015  2:46 PM    CONTINUE these  medications which have NOT CHANGED   Details  amLODipine (NORVASC) 5 MG tablet Take 1 tablet by mouth daily., Until Discontinued, Historical Med    ascorbic acid (VITAMIN C) 1000 MG tablet Take 1,000 mg by mouth daily., Until Discontinued, Historical Med    aspirin EC 325 MG EC tablet Take 1 tablet (325 mg total) by mouth daily., Starting 01/30/2015, Until Discontinued, Normal    carvedilol (COREG) 3.125 MG tablet Take 6.25 mg by mouth 2 (two) times daily. , Until Discontinued, Historical Med    cholecalciferol (VITAMIN D) 1000 UNITS tablet Take 1,000 Units by mouth daily., Until Discontinued, Historical Med    folic acid (FOLVITE) 1 MG tablet Take 1 mg by mouth daily., Until Discontinued, Historical Med    glimepiride (AMARYL) 1 MG tablet Take 1 tablet by mouth daily with supper., Until Discontinued, Historical Med    !! levothyroxine (SYNTHROID, LEVOTHROID) 50 MCG tablet Take 50 mcg by mouth every other day. On the opposite days from taking 55mcg., Until Discontinued, Historical Med    !! levothyroxine (SYNTHROID, LEVOTHROID) 75 MCG tablet Take 75 mcg by mouth every other day. On the opposite days from taking 65mcg, Until Discontinued, Historical Med    saccharomyces boulardii (FLORASTOR) 250 MG capsule Take 1 capsule (250 mg total) by mouth 2 (two) times daily., Starting 01/30/2015, Until Discontinued, Normal    vitamin E 400 UNIT capsule Take 400 Units by mouth daily., Until Discontinued, Historical Med     !! - Potential duplicate medications found. Please discuss with provider.       DISCHARGE INSTRUCTIONS:   DIET:  Cardiac  diet and Diabetic diet  DISCHARGE CONDITION:  Stable  ACTIVITY:  Activity as tolerated  OXYGEN:  Home Oxygen: No.   Oxygen Delivery: room air  DISCHARGE LOCATION:  home   If you experience worsening of your admission symptoms, develop shortness of breath, life threatening emergency, suicidal or homicidal thoughts you must seek medical attention  immediately by calling 911 or calling your MD immediately  if symptoms less severe.  You Must read complete instructions/literature along with all the possible adverse reactions/side effects for all the Medicines you take and that have been prescribed to you. Take any new Medicines after you have completely understood and accpet all the possible adverse reactions/side effects.   Please note  You were cared for by a hospitalist during your hospital stay. If you have any questions about your discharge medications or the care you received while you were in the hospital after you are discharged, you can call the unit and asked to speak with the hospitalist on call if the hospitalist that took care of you is not available. Once you are discharged, your primary care physician will handle any further medical issues. Please note that NO REFILLS for any discharge medications will be authorized once you are discharged, as it is imperative that you return to your primary care physician (or establish a relationship with a primary care physician if you do not have one) for your aftercare needs so that they can reassess your need for medications and monitor your lab values.     Today   Patient presently denies any chest pain. No shortness of breath, nausea, vomiting. Husband at bedside.  VITAL SIGNS:  Blood pressure 151/59, pulse 87, temperature 97.7 F (36.5 C), temperature source Oral, resp. rate 18, height 5' (1.524 m), weight 63.912 kg (140 lb 14.4 oz), SpO2 97 %.  I/O:    Intake/Output Summary (Last 24 hours) at 03/15/15 1545 Last data filed at 03/15/15 1206  Gross per 24 hour  Intake    240 ml  Output    200 ml  Net     40 ml    PHYSICAL EXAMINATION:  GENERAL:  81 y.o.-year-old patient lying in the bed with no acute distress.  EYES: Pupils equal, round, reactive to light and accommodation. No scleral icterus. Extraocular muscles intact.  HEENT: Head atraumatic, normocephalic. Oropharynx and  nasopharynx clear.  NECK:  Supple, no jugular venous distention. No thyroid enlargement, no tenderness.  LUNGS: Normal breath sounds bilaterally, no wheezing, rales,rhonchi. No use of accessory muscles of respiration.  CARDIOVASCULAR: S1, S2 RRR. No murmurs, rubs, or gallops.  ABDOMEN: Soft, non-tender, non-distended. Bowel sounds present. No organomegaly or mass.  EXTREMITIES: No pedal edema, cyanosis, or clubbing.  NEUROLOGIC: Cranial nerves II through XII are intact. No focal motor or sensory defecits b/l.  PSYCHIATRIC: The patient is alert and oriented x 3. Good affect.  SKIN: No obvious rash, lesion, or ulcer.   DATA REVIEW:   CBC  Recent Labs Lab 03/15/15 0603  WBC 5.8  HGB 8.7*  HCT 28.4*  PLT 96*    Chemistries   Recent Labs Lab 03/15/15 0603  NA 141  K 3.8  CL 111  CO2 24  GLUCOSE 96  BUN 20  CREATININE 1.02*  CALCIUM 8.6*  AST 18  ALT 14  ALKPHOS 23*  BILITOT 1.1    Cardiac Enzymes  Recent Labs Lab 03/14/15 2306  TROPONINI <0.03     RADIOLOGY:  Dg Chest 2 View  03/14/2015   CLINICAL  DATA:  Chest tightness.  EXAM: CHEST  2 VIEW  COMPARISON:  Chest x-ray dated 10/15/2014.  FINDINGS: Cardiomediastinal silhouette is normal in size and configuration. Lungs are clear. Lung volumes are normal. No pleural effusion. No pneumothorax.  Foreshortening of the distal left clavicle again noted and presumably postsurgical and/or related to previous trauma. Chronic compression fracture deformity of a lower thoracic vertebral body also again noted. No acute osseous abnormality seen.  IMPRESSION: No evidence of acute cardiopulmonary abnormality. Heart size is normal. Lungs are clear.  Chronic/ incidental findings detailed above.   Electronically Signed   By: Franki Cabot M.D.   On: 03/14/2015 11:20   Nm Myocar Multi W/spect W/wall Motion / Ef  03/15/2015    The study is normal.  The left ventricular ejection fraction is normal (55-65%).       Management plans  discussed with the patient, family and they are in agreement.  CODE STATUS:     Code Status Orders        Start     Ordered   03/14/15 1256  Full code   Continuous     03/14/15 1255      TOTAL TIME TAKING CARE OF THIS PATIENT: 40 minutes.    Henreitta Leber M.D on 03/15/2015 at 3:45 PM  Between 7am to 6pm - Pager - [phone]  After 6pm go to [email or web address] - password [email or web address] [facility]  Office  [phone]  CC: Primary care physician; [name], MD

## 2015-03-15 NOTE — Progress Notes (Addendum)
Dr. Verdell Carmine notified of test results, patient discharged, IV and telemetry removed, reviewed instructions and home meds, no new rx to give, f/u appt given, pt and family verbalized understanding, escorted by volunteer

## 2015-03-15 NOTE — Discharge Instructions (Signed)

## 2015-03-15 NOTE — Progress Notes (Addendum)
Pt complained of lack of sleep. MD notified. Medication given. Pt resting in bed. PT refused bed alarm at night due to difficulty sleeping. Pt complained of headache. Medication given. Relief. Will monitor. Pt inquired about oral hypoglycemic medication. Per note MD wanted patient on sliding scale but no order. Pt states " I won't take the insulin" Will pass information to day shift RN. Information to be followed up with MD in am.

## 2015-03-18 DIAGNOSIS — I10 Essential (primary) hypertension: Secondary | ICD-10-CM | POA: Diagnosis not present

## 2015-03-18 DIAGNOSIS — E782 Mixed hyperlipidemia: Secondary | ICD-10-CM | POA: Diagnosis not present

## 2015-03-18 DIAGNOSIS — D631 Anemia in chronic kidney disease: Secondary | ICD-10-CM | POA: Diagnosis not present

## 2015-03-18 DIAGNOSIS — Z79899 Other long term (current) drug therapy: Secondary | ICD-10-CM | POA: Diagnosis not present

## 2015-03-18 DIAGNOSIS — D6489 Other specified anemias: Secondary | ICD-10-CM | POA: Diagnosis not present

## 2015-03-18 DIAGNOSIS — C91Z0 Other lymphoid leukemia not having achieved remission: Secondary | ICD-10-CM | POA: Diagnosis not present

## 2015-03-18 DIAGNOSIS — N039 Chronic nephritic syndrome with unspecified morphologic changes: Secondary | ICD-10-CM | POA: Diagnosis not present

## 2015-03-20 DIAGNOSIS — M25561 Pain in right knee: Secondary | ICD-10-CM | POA: Diagnosis not present

## 2015-03-20 DIAGNOSIS — M6281 Muscle weakness (generalized): Secondary | ICD-10-CM | POA: Insufficient documentation

## 2015-03-20 DIAGNOSIS — M25562 Pain in left knee: Secondary | ICD-10-CM | POA: Diagnosis not present

## 2015-03-20 DIAGNOSIS — Z96651 Presence of right artificial knee joint: Secondary | ICD-10-CM | POA: Diagnosis not present

## 2015-03-26 DIAGNOSIS — I10 Essential (primary) hypertension: Secondary | ICD-10-CM | POA: Diagnosis not present

## 2015-03-26 DIAGNOSIS — N390 Urinary tract infection, site not specified: Secondary | ICD-10-CM | POA: Diagnosis not present

## 2015-03-26 DIAGNOSIS — C9510 Chronic leukemia of unspecified cell type not having achieved remission: Secondary | ICD-10-CM | POA: Diagnosis not present

## 2015-03-26 DIAGNOSIS — N3942 Incontinence without sensory awareness: Secondary | ICD-10-CM | POA: Diagnosis not present

## 2015-03-26 DIAGNOSIS — E119 Type 2 diabetes mellitus without complications: Secondary | ICD-10-CM | POA: Diagnosis not present

## 2015-03-26 DIAGNOSIS — E039 Hypothyroidism, unspecified: Secondary | ICD-10-CM | POA: Diagnosis not present

## 2015-03-26 DIAGNOSIS — B373 Candidiasis of vulva and vagina: Secondary | ICD-10-CM | POA: Diagnosis not present

## 2015-03-27 ENCOUNTER — Ambulatory Visit: Payer: Medicare Other | Admitting: Physical Therapy

## 2015-03-28 DIAGNOSIS — R1312 Dysphagia, oropharyngeal phase: Secondary | ICD-10-CM | POA: Diagnosis not present

## 2015-03-28 DIAGNOSIS — I2510 Atherosclerotic heart disease of native coronary artery without angina pectoris: Secondary | ICD-10-CM | POA: Diagnosis not present

## 2015-03-28 DIAGNOSIS — N189 Chronic kidney disease, unspecified: Secondary | ICD-10-CM | POA: Diagnosis not present

## 2015-03-28 DIAGNOSIS — D631 Anemia in chronic kidney disease: Secondary | ICD-10-CM | POA: Diagnosis not present

## 2015-03-28 DIAGNOSIS — C91Z0 Other lymphoid leukemia not having achieved remission: Secondary | ICD-10-CM | POA: Diagnosis not present

## 2015-03-28 DIAGNOSIS — N039 Chronic nephritic syndrome with unspecified morphologic changes: Secondary | ICD-10-CM | POA: Diagnosis not present

## 2015-04-02 ENCOUNTER — Encounter: Payer: Medicare Other | Admitting: Physical Therapy

## 2015-04-03 ENCOUNTER — Ambulatory Visit: Payer: Medicare Other | Admitting: Physical Therapy

## 2015-04-04 ENCOUNTER — Encounter: Payer: Medicare Other | Admitting: Physical Therapy

## 2015-04-08 ENCOUNTER — Encounter: Payer: Medicare Other | Admitting: Physical Therapy

## 2015-04-09 ENCOUNTER — Encounter: Payer: Medicare Other | Admitting: Physical Therapy

## 2015-04-09 DIAGNOSIS — M791 Myalgia: Secondary | ICD-10-CM | POA: Diagnosis not present

## 2015-04-09 DIAGNOSIS — M4698 Unspecified inflammatory spondylopathy, sacral and sacrococcygeal region: Secondary | ICD-10-CM | POA: Diagnosis not present

## 2015-04-09 DIAGNOSIS — M4856XS Collapsed vertebra, not elsewhere classified, lumbar region, sequela of fracture: Secondary | ICD-10-CM | POA: Diagnosis not present

## 2015-04-10 ENCOUNTER — Encounter: Payer: Medicare Other | Admitting: Physical Therapy

## 2015-04-11 ENCOUNTER — Encounter: Payer: Medicare Other | Admitting: Physical Therapy

## 2015-04-11 DIAGNOSIS — N189 Chronic kidney disease, unspecified: Secondary | ICD-10-CM | POA: Diagnosis not present

## 2015-04-11 DIAGNOSIS — E1122 Type 2 diabetes mellitus with diabetic chronic kidney disease: Secondary | ICD-10-CM | POA: Diagnosis not present

## 2015-04-11 DIAGNOSIS — D631 Anemia in chronic kidney disease: Secondary | ICD-10-CM | POA: Diagnosis not present

## 2015-04-11 DIAGNOSIS — I129 Hypertensive chronic kidney disease with stage 1 through stage 4 chronic kidney disease, or unspecified chronic kidney disease: Secondary | ICD-10-CM | POA: Diagnosis not present

## 2015-04-15 ENCOUNTER — Encounter: Payer: Medicare Other | Admitting: Physical Therapy

## 2015-04-16 ENCOUNTER — Encounter: Payer: Medicare Other | Admitting: Physical Therapy

## 2015-04-17 ENCOUNTER — Encounter: Payer: Medicare Other | Admitting: Physical Therapy

## 2015-04-18 ENCOUNTER — Encounter: Payer: Medicare Other | Admitting: Physical Therapy

## 2015-04-22 ENCOUNTER — Encounter: Payer: Self-pay | Admitting: Physical Therapy

## 2015-04-22 ENCOUNTER — Ambulatory Visit: Payer: Medicare Other | Attending: Orthopedic Surgery | Admitting: Physical Therapy

## 2015-04-22 DIAGNOSIS — R531 Weakness: Secondary | ICD-10-CM | POA: Insufficient documentation

## 2015-04-22 DIAGNOSIS — R262 Difficulty in walking, not elsewhere classified: Secondary | ICD-10-CM | POA: Diagnosis not present

## 2015-04-22 NOTE — Therapy (Signed)
[place] MAIN [facility] SERVICES [address] [place], [place], [address] Phone: [phone]   Fax:  [phone]  Physical Therapy Evaluation  Patient Details  Name: Stephanie Harris MRN: [id number] Date of Birth: 1931/06/07 Referring Provider:  Sindy Messing, MD  Encounter Date: 04/22/2015      PT End of Session - 04/22/15 1843    Visit Number 1   Number of Visits 17   Date for PT Re-Evaluation 06/17/15   PT Start Time 0400   PT Stop Time 0500   PT Time Calculation (min) 60 min   Activity Tolerance Patient limited by fatigue      Past Medical History  Diagnosis Date  . Hypertension   . Stroke   . RA (rheumatoid arthritis)   . Heart disease   . CML (chronic myelocytic leukemia)     Onc at [facility]    Past Surgical History  Procedure Laterality Date  . Abdominal hysterectomy    . Cholecystectomy    . Total knee arthroplasty Right   . Rotator cuff repair Left   . Cataract extraction    . Percutaneous coronary stent intervention (pci-s)    . Carotid endarterectomy Left     There were no vitals filed for this visit.  Visit Diagnosis:  Difficulty walking  Weakness generalized      Subjective Assessment - 04/22/15 1620    Subjective Patient  is having unsteady gait and poor movement.She has been in and out of the hospital 3 times in the past several months and is weak.    Currently in Pain? Yes   Pain Score 0-No pain            [facility] PT Assessment - 04/22/15 1715    Assessment   Medical Diagnosis unsteady gait   Onset Date/Surgical Date 03/20/15   Hand Dominance Right   Next MD Visit 04/25/15   Prior Therapy PT   Precautions   Precautions Fall   Balance Screen   Has the patient fallen in the past 6 months Yes   How many times? 1   Has the patient had a decrease in activity level because of a fear of falling?  Yes   Is the patient reluctant to leave their home because of a fear of falling?  Yes   [place] Private residence   Living Arrangements Spouse/significant other   Available Help at Discharge Family   Type of [place] entrance   Prior Function   Level of Independence Independent     Outcome measures:  TUG= 30.95 sec 10 MW= . 41 m/sec 5 x sit to stand= 49.66 sec  Gait : Patient has slow guarded with decreased weight shift, decreased step and decreased step height.  Transfers; slow and multiple attempts Bed mobility: needs assist to get LE's up on the bed sit to supine Standing balance dynamic fair, static good standing Seated dynamic and standing balance good Strength is -3/5 BLE  Hips, knees  , ankles                       PT Education - 04/22/15 1843    Education provided Yes   Education Details HEP   Person(s) Educated Patient   Methods Explanation   Comprehension Verbalized understanding             PT Long Term Goals - 04/22/15 1721  PT LONG TERM GOAL #1   Title Patient will complete a TUG test in <12 seconds without an AD for independent mobility and decreased fall risk in 8 weeks.  by 06/22/15   PT LONG TERM GOAL #2   Title Patient (>59 years old) will complete 5x/sit to stand test in < 14.2 seconds indicating a decreased likelihood of a balance dysfunction and improved LE strength in 8 weeks.  By 06/22/15   PT LONG TERM GOAL #3   Title Patient will be independent with ambulation with LRAD on unlevel surfaces without LOB for decreased fall risk in 4 weeks by 06/22/15               Plan - 05/15/2015 1844    Clinical Impression Statement Patient presents with decreased strength BLE 3/5 and unsteady gait. She has been in and out of the hospital 3 times in the last several months and has declined in functional mobility.    Pt will benefit from skilled therapeutic intervention in order to improve on the following deficits Abnormal gait;Difficulty walking;Decreased endurance;Decreased activity  tolerance;Decreased balance;Decreased mobility;Decreased strength   Rehab Potential Fair   PT Frequency 2x / week   PT Duration 8 weeks   PT Treatment/Interventions Balance training;Therapeutic exercise;Therapeutic activities;Functional mobility training;Stair training;Gait training;Energy conservation   Consulted and Agree with Plan of Care Patient          G-Codes - 05/15/2015 1846    Functional Assessment Tool Used TUG 10MW , 5 x sit to stand   Functional Limitation Mobility: Walking and moving around   Mobility: Walking and Moving Around Current Status [phone]) At least 40 percent but less than 60 percent impaired, limited or restricted   Mobility: Walking and Moving Around Goal Status [id number]) At least 20 percent but less than 40 percent impaired, limited or restricted       Problem List Patient Active Problem List   Diagnosis Date Noted  . Chest pain 03/14/2015  . TIA (transient ischemic attack) 01/29/2015  . Allergic arthritis, hand     Alanson Puls May 15, 2015, 6:47 PM  [place] MAIN [facility] SERVICES [address] [place], [place], [address] Phone: [phone]   Fax:  [phone]

## 2015-04-23 ENCOUNTER — Encounter: Payer: Medicare Other | Admitting: Physical Therapy

## 2015-04-25 ENCOUNTER — Encounter: Payer: Medicare Other | Admitting: Physical Therapy

## 2015-04-25 DIAGNOSIS — E1122 Type 2 diabetes mellitus with diabetic chronic kidney disease: Secondary | ICD-10-CM | POA: Diagnosis not present

## 2015-04-25 DIAGNOSIS — D631 Anemia in chronic kidney disease: Secondary | ICD-10-CM | POA: Diagnosis not present

## 2015-04-25 DIAGNOSIS — N189 Chronic kidney disease, unspecified: Secondary | ICD-10-CM | POA: Diagnosis not present

## 2015-04-25 DIAGNOSIS — I129 Hypertensive chronic kidney disease with stage 1 through stage 4 chronic kidney disease, or unspecified chronic kidney disease: Secondary | ICD-10-CM | POA: Diagnosis not present

## 2015-04-30 ENCOUNTER — Ambulatory Visit: Payer: Medicare Other | Attending: Orthopedic Surgery | Admitting: Physical Therapy

## 2015-04-30 ENCOUNTER — Encounter: Payer: Self-pay | Admitting: Physical Therapy

## 2015-04-30 ENCOUNTER — Encounter: Payer: Medicare Other | Admitting: Physical Therapy

## 2015-04-30 DIAGNOSIS — R531 Weakness: Secondary | ICD-10-CM | POA: Insufficient documentation

## 2015-04-30 DIAGNOSIS — R262 Difficulty in walking, not elsewhere classified: Secondary | ICD-10-CM | POA: Insufficient documentation

## 2015-04-30 NOTE — Therapy (Signed)
[place] MAIN [facility] SERVICES [address] [place], [place], [address] Phone: [phone]   Fax:  [phone]  Physical Therapy Treatment  Patient Details  Name: Stephanie Harris MRN: [id number] Date of Birth: [birth year] Referring Provider:  Sindy Messing, MD  Encounter Date: 04/30/2015      PT End of Session - 04/30/15 1746    Visit Number 2   Number of Visits 17   Date for PT Re-Evaluation 06/17/15   PT Start Time 0445   PT Stop Time 0530   PT Time Calculation (min) 45 min   Activity Tolerance Patient limited by fatigue      Past Medical History  Diagnosis Date  . Hypertension   . Stroke   . RA (rheumatoid arthritis)   . Heart disease   . CML (chronic myelocytic leukemia)     Onc at [facility]    Past Surgical History  Procedure Laterality Date  . Abdominal hysterectomy    . Cholecystectomy    . Total knee arthroplasty Right   . Rotator cuff repair Left   . Cataract extraction    . Percutaneous coronary stent intervention (pci-s)    . Carotid endarterectomy Left     There were no vitals filed for this visit.  Visit Diagnosis:  Difficulty walking  Weakness generalized      Subjective Assessment - 04/30/15 1745    Subjective Patient  is having unsteady gait and poor movement.She was able to go shopping       Therapeutic exericse:  Step ups x 10 bilaterally Tapping step x 10 x 2 Leg press with 45 lbs x 20, heel raises x 20 x 3 sets  Gait training with Rolator  x 400 feet with CGA and turning 360 feet .  Patient has better standing tolerance.                            PT Education - 04/30/15 1746    Education provided Yes   Education Details HEP   Person(s) Educated Patient   Methods Explanation   Comprehension Verbalized understanding             PT Long Term Goals - 04/22/15 1721    PT LONG TERM GOAL #1   Title Patient will complete a TUG test in <12 seconds without an AD for  independent mobility and decreased fall risk in 8 weeks.  by 06/22/15   PT LONG TERM GOAL #2   Title Patient (>80 years old) will complete 5x/sit to stand test in < 14.2 seconds indicating a decreased likelihood of a balance dysfunction and improved LE strength in 8 weeks.  By 06/22/15   PT LONG TERM GOAL #3   Title Patient will be independent with ambulation with LRAD on unlevel surfaces without LOB for decreased fall risk in 4 weeks by 06/22/15               Plan - 04/30/15 1746    Clinical Impression Statement Patient continues to have weakness and unsteady gait. She is able to perform standing exercises and improved ambulation.    Pt will benefit from skilled therapeutic intervention in order to improve on the following deficits Abnormal gait;Difficulty walking;Decreased endurance;Decreased activity tolerance;Decreased balance;Decreased mobility;Decreased strength   Rehab Potential Fair   PT Frequency 2x / week   PT Duration 8 weeks   PT Treatment/Interventions Balance training;Therapeutic exercise;Therapeutic activities;Functional mobility training;Stair training;Gait training;Energy conservation  Consulted and Agree with Plan of Care Patient        Problem List Patient Active Problem List   Diagnosis Date Noted  . Chest pain 03/14/2015  . TIA (transient ischemic attack) 01/29/2015  . Allergic arthritis, hand     Alanson Puls 04/30/2015, 5:49 PM  [place] MAIN [facility] SERVICES [address] [place], [place], [address] Phone: [phone]   Fax:  [phone]

## 2015-05-02 ENCOUNTER — Encounter: Payer: Medicare Other | Admitting: Physical Therapy

## 2015-05-02 ENCOUNTER — Ambulatory Visit: Payer: Medicare Other | Admitting: Physical Therapy

## 2015-05-02 ENCOUNTER — Encounter: Payer: Self-pay | Admitting: Physical Therapy

## 2015-05-02 DIAGNOSIS — N3942 Incontinence without sensory awareness: Secondary | ICD-10-CM | POA: Diagnosis not present

## 2015-05-02 DIAGNOSIS — E039 Hypothyroidism, unspecified: Secondary | ICD-10-CM | POA: Diagnosis not present

## 2015-05-02 DIAGNOSIS — R531 Weakness: Secondary | ICD-10-CM | POA: Diagnosis not present

## 2015-05-02 DIAGNOSIS — R262 Difficulty in walking, not elsewhere classified: Secondary | ICD-10-CM

## 2015-05-02 DIAGNOSIS — I10 Essential (primary) hypertension: Secondary | ICD-10-CM | POA: Diagnosis not present

## 2015-05-02 NOTE — Therapy (Signed)
[place] MAIN [facility] SERVICES [address] [place], [place], [address] Phone: [phone]   Fax:  [phone]  Physical Therapy Treatment  Patient Details  Name: DEZYRE HOEFER MRN: [id number] Date of Birth: 17-Apr-1931 Referring Provider:  Sindy Messing, MD  Encounter Date: 05/02/2015      PT End of Session - 05/02/15 1650    Visit Number 3   Number of Visits 17   Date for PT Re-Evaluation 06/17/15   PT Start Time 0445   PT Stop Time 0530   PT Time Calculation (min) 45 min   Activity Tolerance Patient limited by fatigue      Past Medical History  Diagnosis Date  . Hypertension   . Stroke   . RA (rheumatoid arthritis)   . Heart disease   . CML (chronic myelocytic leukemia)     Onc at [facility]    Past Surgical History  Procedure Laterality Date  . Abdominal hysterectomy    . Cholecystectomy    . Total knee arthroplasty Right   . Rotator cuff repair Left   . Cataract extraction    . Percutaneous coronary stent intervention (pci-s)    . Carotid endarterectomy Left     There were no vitals filed for this visit.  Visit Diagnosis:  Difficulty walking  Weakness generalized      Subjective Assessment - 05/02/15 1649    Subjective Patient  is having unsteady gait and poor movement.She was able to go shopping    Currently in Pain? No/denies      Neuromuscular training: Tandem stand , modified tandem stand with head turn, modified tandem stand with ball toss Side stepping with head control Step ups to stool, step ups from blue foam tapping to stool Weight shift and step backwards and weight shift step fwd x 20  Tandem walking fwd, side stepping  Side stepping on blue foam beam x 5 Leg press with 90 lbs and heel raises 90 lbs x 20 x 3 Patient has difficulty with large steps backwards and with weight shift backwards.  Tilt board fwd/backwards and side to side                            PT  Education - 05/02/15 1650    Education provided Yes   Education Details HEP   Person(s) Educated Patient   Methods Explanation   Comprehension Verbalized understanding             PT Long Term Goals - 04/22/15 1721    PT LONG TERM GOAL #1   Title Patient will complete a TUG test in <12 seconds without an AD for independent mobility and decreased fall risk in 8 weeks.  by 06/22/15   PT LONG TERM GOAL #2   Title Patient (>52 years old) will complete 5x/sit to stand test in < 14.2 seconds indicating a decreased likelihood of a balance dysfunction and improved LE strength in 8 weeks.  By 06/22/15   PT LONG TERM GOAL #3   Title Patient will be independent with ambulation with LRAD on unlevel surfaces without LOB for decreased fall risk in 4 weeks by 06/22/15               Plan - 05/02/15 1650    Clinical Impression Statement  Patients performance improves with practice and she uses UE to help support and for balance   Pt will benefit from skilled therapeutic intervention  in order to improve on the following deficits Abnormal gait;Difficulty walking;Decreased endurance;Decreased activity tolerance;Decreased balance;Decreased mobility;Decreased strength   Rehab Potential Fair   PT Frequency 2x / week   PT Duration 8 weeks   PT Treatment/Interventions Balance training;Therapeutic exercise;Therapeutic activities;Functional mobility training;Stair training;Gait training;Energy conservation   Consulted and Agree with Plan of Care Patient        Problem List Patient Active Problem List   Diagnosis Date Noted  . Chest pain 03/14/2015  . TIA (transient ischemic attack) 01/29/2015  . Allergic arthritis, hand     Alanson Puls 05/02/2015, 4:54 PM  [place] MAIN [facility] SERVICES [address] [place], [place], [address] Phone: [phone]   Fax:  [phone]

## 2015-05-06 ENCOUNTER — Ambulatory Visit: Payer: Medicare Other | Admitting: Physical Therapy

## 2015-05-06 ENCOUNTER — Encounter: Payer: Self-pay | Admitting: Physical Therapy

## 2015-05-06 DIAGNOSIS — R531 Weakness: Secondary | ICD-10-CM

## 2015-05-06 DIAGNOSIS — R262 Difficulty in walking, not elsewhere classified: Secondary | ICD-10-CM

## 2015-05-06 NOTE — Therapy (Signed)
[place] MAIN [facility] SERVICES [address] [place], [place], [address] Phone: [phone]   Fax:  [phone]  Physical Therapy Treatment  Patient Details  Name: [place] Harris MRN: [id number] Date of Birth: April 25, 1931 Referring Provider:  Sindy Messing, MD  Encounter Date: 05/06/2015      PT End of Session - 05/06/15 1309    Visit Number 4   Number of Visits 17   Date for PT Re-Evaluation 06/17/15   PT Start Time 0100   PT Stop Time 0145   PT Time Calculation (min) 45 min   Equipment Utilized During Treatment Gait belt   Activity Tolerance Patient limited by fatigue      Past Medical History  Diagnosis Date  . Hypertension   . Stroke   . RA (rheumatoid arthritis)   . Heart disease   . CML (chronic myelocytic leukemia)     Onc at [facility]    Past Surgical History  Procedure Laterality Date  . Abdominal hysterectomy    . Cholecystectomy    . Total knee arthroplasty Right   . Rotator cuff repair Left   . Cataract extraction    . Percutaneous coronary stent intervention (pci-s)    . Carotid endarterectomy Left     There were no vitals filed for this visit.  Visit Diagnosis:  Difficulty walking  Weakness generalized      Subjective Assessment - 05/06/15 1308    Subjective Patient  is having unsteady gait and poor movement.   Currently in Pain? No/denies          Treatment : 4 way with YTB x 10 BLE Step ups to stool,  Weight shift and step backwards and weight shift step fwd x 20  Tandem standing Leg press with 45 lbs and heel raises 945 lbs x 20 x 3 Patient has difficulty with large steps backwards and with weight shift backwards.  Pt needed mod cueing with CGA while performing stability tasks                         PT Education - 05/06/15 1308    Education provided Yes   Education Details Progression of HEP   Person(s) Educated Patient   Methods Explanation   Comprehension  Verbalized understanding             PT Long Term Goals - 04/22/15 1721    PT LONG TERM GOAL #1   Title Patient will complete a TUG test in <12 seconds without an AD for independent mobility and decreased fall risk in 8 weeks.  by 06/22/15   PT LONG TERM GOAL #2   Title Patient (>46 years old) will complete 5x/sit to stand test in < 14.2 seconds indicating a decreased likelihood of a balance dysfunction and improved LE strength in 8 weeks.  By 06/22/15   PT LONG TERM GOAL #3   Title Patient will be independent with ambulation with LRAD on unlevel surfaces without LOB for decreased fall risk in 4 weeks by 06/22/15               Plan - 05/06/15 1309    Clinical Impression Statement  Patients performance improves with practice and she uses UE to help support and for balance. Pt showed decreased stability and fatigue with balance tasks.    Pt will benefit from skilled therapeutic intervention in order to improve on the following deficits Abnormal gait;Difficulty walking;Decreased endurance;Decreased activity tolerance;Decreased  balance;Decreased mobility;Decreased strength   Rehab Potential Fair   PT Frequency 2x / week   PT Duration 8 weeks   PT Treatment/Interventions Balance training;Therapeutic exercise;Therapeutic activities;Functional mobility training;Stair training;Gait training;Energy conservation   Consulted and Agree with Plan of Care Patient        Problem List Patient Active Problem List   Diagnosis Date Noted  . Chest pain 03/14/2015  . TIA (transient ischemic attack) 01/29/2015  . Allergic arthritis, hand     Alanson Puls 05/06/2015, 1:11 PM  [place] MAIN [facility] SERVICES [address] [place], [place], [address] Phone: [phone]   Fax:  [phone]

## 2015-05-07 ENCOUNTER — Encounter: Payer: Medicare Other | Admitting: Physical Therapy

## 2015-05-08 ENCOUNTER — Encounter: Payer: Self-pay | Admitting: Physical Therapy

## 2015-05-08 ENCOUNTER — Ambulatory Visit: Payer: Medicare Other | Admitting: Physical Therapy

## 2015-05-08 DIAGNOSIS — R262 Difficulty in walking, not elsewhere classified: Secondary | ICD-10-CM

## 2015-05-08 DIAGNOSIS — R531 Weakness: Secondary | ICD-10-CM | POA: Diagnosis not present

## 2015-05-08 NOTE — Therapy (Signed)
[place] MAIN [facility] SERVICES [address] [place], [place], [address] Phone: [phone]   Fax:  [phone]  Physical Therapy Treatment  Patient Details  Name: [place] Harris MRN: [id number] Date of Birth: Jul 05, 1931 Referring Provider:  Sindy Messing, MD  Encounter Date: 05/08/2015      PT End of Session - 05/08/15 1634    Visit Number 5   Number of Visits 17   Date for PT Re-Evaluation 06/17/15   PT Start Time 0400   PT Stop Time 0440   PT Time Calculation (min) 40 min   Equipment Utilized During Treatment Gait belt   Activity Tolerance Patient limited by fatigue      Past Medical History  Diagnosis Date  . Hypertension   . Stroke   . RA (rheumatoid arthritis)   . Heart disease   . CML (chronic myelocytic leukemia)     Onc at [facility]    Past Surgical History  Procedure Laterality Date  . Abdominal hysterectomy    . Cholecystectomy    . Total knee arthroplasty Right   . Rotator cuff repair Left   . Cataract extraction    . Percutaneous coronary stent intervention (pci-s)    . Carotid endarterectomy Left     There were no vitals filed for this visit.  Visit Diagnosis:  Difficulty walking  Weakness generalized      Subjective Assessment - 05/08/15 1632    Subjective Patient  is having unsteady gait and poor movement.She did not sleep well last night.                Therapeutic exericse:  Step ups x 10 bilaterally Tapping step x 10 x 2 Leg press with 45 lbs x 20, heel raises x 20 x 3 sets Seated LAQ with YTB x 20 Seated hip flex with YTB x 20 Standing hip abd and flex with YTB x 10 x 2 BLE Side stepping with YTB 10 feet x 2  Gait training with Rolator x 400 feet with CGA and turning 360 feet .  Patient has better standing tolerance.                                PT Education - 05/08/15 1633    Education provided Yes   Education Details safety with transfers   Person(s) Educated Patient   Methods Explanation   Comprehension Verbalized understanding             PT Long Term Goals - 04/22/15 1721    PT LONG TERM GOAL #1   Title Patient will complete a TUG test in <12 seconds without an AD for independent mobility and decreased fall risk in 8 weeks.  by 06/22/15   PT LONG TERM GOAL #2   Title Patient (>81 years old) will complete 5x/sit to stand test in < 14.2 seconds indicating a decreased likelihood of a balance dysfunction and improved LE strength in 8 weeks.  By 06/22/15   PT LONG TERM GOAL #3   Title Patient will be independent with ambulation with LRAD on unlevel surfaces without LOB for decreased fall risk in 4 weeks by 06/22/15               Plan - 05/08/15 1634    Clinical Impression Statement Patient has LE weakness and slow unsteady gait. She is able to perform all exrecises in standing.    Pt  will benefit from skilled therapeutic intervention in order to improve on the following deficits Abnormal gait;Difficulty walking;Decreased endurance;Decreased activity tolerance;Decreased balance;Decreased mobility;Decreased strength   Rehab Potential Fair   PT Frequency 2x / week   PT Duration 8 weeks   PT Treatment/Interventions Balance training;Therapeutic exercise;Therapeutic activities;Functional mobility training;Stair training;Gait training;Energy conservation   Consulted and Agree with Plan of Care Patient        Problem List Patient Active Problem List   Diagnosis Date Noted  . Chest pain 03/14/2015  . TIA (transient ischemic attack) 01/29/2015  . Allergic arthritis, hand     Alanson Puls 05/08/2015, 4:39 PM  [place] MAIN [facility] SERVICES [address] [place], [place], [address] Phone: [phone]   Fax:  [phone]

## 2015-05-09 ENCOUNTER — Encounter: Payer: Medicare Other | Admitting: Physical Therapy

## 2015-05-09 DIAGNOSIS — D638 Anemia in other chronic diseases classified elsewhere: Secondary | ICD-10-CM | POA: Diagnosis not present

## 2015-05-09 DIAGNOSIS — C91Z0 Other lymphoid leukemia not having achieved remission: Secondary | ICD-10-CM | POA: Diagnosis not present

## 2015-05-13 ENCOUNTER — Ambulatory Visit: Payer: Medicare Other | Admitting: Physical Therapy

## 2015-05-15 ENCOUNTER — Encounter: Payer: Medicare Other | Admitting: Physical Therapy

## 2015-05-20 ENCOUNTER — Ambulatory Visit: Payer: Medicare Other | Admitting: Physical Therapy

## 2015-05-20 ENCOUNTER — Encounter: Payer: Medicare Other | Admitting: Occupational Therapy

## 2015-05-20 ENCOUNTER — Encounter: Payer: Self-pay | Admitting: Physical Therapy

## 2015-05-20 DIAGNOSIS — R262 Difficulty in walking, not elsewhere classified: Secondary | ICD-10-CM | POA: Diagnosis not present

## 2015-05-20 DIAGNOSIS — R531 Weakness: Secondary | ICD-10-CM | POA: Diagnosis not present

## 2015-05-20 NOTE — Therapy (Signed)
[place] MAIN [facility] SERVICES [address] [place], [place], [address] Phone: [phone]   Fax:  [phone]  Physical Therapy Treatment  Patient Details  Name: Stephanie Harris MRN: [id number] Date of Birth: Feb 26, 1931 Referring Provider:  Sindy Messing, MD  Encounter Date: 05/20/2015      PT End of Session - 05/20/15 1416    Visit Number 5   Number of Visits 17   Date for PT Re-Evaluation 06/17/15   PT Start Time 0200   PT Stop Time 0255   PT Time Calculation (min) 55 min   Equipment Utilized During Treatment Gait belt   Activity Tolerance Patient limited by fatigue      Past Medical History  Diagnosis Date  . Hypertension   . Stroke   . RA (rheumatoid arthritis)   . Heart disease   . CML (chronic myelocytic leukemia)     Onc at [facility]    Past Surgical History  Procedure Laterality Date  . Abdominal hysterectomy    . Cholecystectomy    . Total knee arthroplasty Right   . Rotator cuff repair Left   . Cataract extraction    . Percutaneous coronary stent intervention (pci-s)    . Carotid endarterectomy Left     There were no vitals filed for this visit.  Visit Diagnosis:  Difficulty walking  Weakness generalized      Subjective Assessment - 05/20/15 1411    Subjective Patient was sick all last week and was in bed.    Currently in Pain? No/denies         standing hip abd with YTB x 20  side stepping left and right in parallel bars 10 feet x 3 standing on blue foam with cone reaching x 20 across midline step ups from floor to 6 inch stool x 20 bilateral sit to stand x 10 marching in parallel bars x 20 stepping pattern with weight shifting fwd/bwd x 10. CGA and Min to mod verbal cues used throughout with increased in postural sway and LOB most seen with narrow base of support and while on uneven surfaces. Continues to have balance deficits typical with diagnosis. Patient performs intermediate level exercises  without pain behaviors and needs verbal cuing for postural alignment and head positioning Tactile cues and assistance needed to keep lower leg and knee in neutral to avoid compensations with ankle motions.                         PT Education - 05/20/15 1412    Education provided Yes   Education Details saftey with mobility   Person(s) Educated Patient   Methods Explanation   Comprehension Verbalized understanding             PT Long Term Goals - 04/22/15 1721    PT LONG TERM GOAL #1   Title Patient will complete a TUG test in <12 seconds without an AD for independent mobility and decreased fall risk in 8 weeks.  by 06/22/15   PT LONG TERM GOAL #2   Title Patient (>45 years old) will complete 5x/sit to stand test in < 14.2 seconds indicating a decreased likelihood of a balance dysfunction and improved LE strength in 8 weeks.  By 06/22/15   PT LONG TERM GOAL #3   Title Patient will be independent with ambulation with LRAD on unlevel surfaces without LOB for decreased fall risk in 4 weeks by 06/22/15  Plan - 05/20/15 1418    Clinical Impression Statement Tactile cues and assistance needed to keep lower leg and knee in neutral to avoid compensations with ankle motions. VC to keep hip in neutral with abduction. Motor control of LE much improved. Muscle fatigue but no major pain complaints. Patient advancing to red theraband for exercises listed above.   Pt will benefit from skilled therapeutic intervention in order to improve on the following deficits Abnormal gait;Difficulty walking;Decreased endurance;Decreased activity tolerance;Decreased balance;Decreased mobility;Decreased strength   Rehab Potential Fair   PT Frequency 2x / week   PT Duration 8 weeks   PT Treatment/Interventions Balance training;Therapeutic exercise;Therapeutic activities;Functional mobility training;Stair training;Gait training;Energy conservation   Consulted and Agree with  Plan of Care Patient        Problem List Patient Active Problem List   Diagnosis Date Noted  . Chest pain 03/14/2015  . TIA (transient ischemic attack) 01/29/2015  . Allergic arthritis, hand     Alanson Puls 05/20/2015, 3:10 PM  [place] MAIN [facility] SERVICES [address] [place], [place], [address] Phone: [phone]   Fax:  [phone]

## 2015-05-22 ENCOUNTER — Ambulatory Visit: Payer: Medicare Other | Admitting: Physical Therapy

## 2015-05-22 ENCOUNTER — Encounter: Payer: Self-pay | Admitting: Physical Therapy

## 2015-05-22 DIAGNOSIS — R262 Difficulty in walking, not elsewhere classified: Secondary | ICD-10-CM

## 2015-05-22 DIAGNOSIS — R531 Weakness: Secondary | ICD-10-CM

## 2015-05-22 NOTE — Therapy (Signed)
[place] MAIN [facility] SERVICES [address] [place], [place], [address] Phone: [phone]   Fax:  [phone]  Physical Therapy Treatment  Patient Details  Name: Stephanie Harris MRN: [id number] Date of Birth: 06/21/1931 Referring Provider:  Sindy Messing, MD  Encounter Date: 05/22/2015      PT End of Session - 05/22/15 1411    Visit Number 6   Number of Visits 17   Date for PT Re-Evaluation 06/17/15   PT Start Time 0200   PT Stop Time 0245   PT Time Calculation (min) 45 min   Equipment Utilized During Treatment Gait belt   Activity Tolerance Patient limited by fatigue      Past Medical History  Diagnosis Date  . Hypertension   . Stroke   . RA (rheumatoid arthritis)   . Heart disease   . CML (chronic myelocytic leukemia)     Onc at [facility]    Past Surgical History  Procedure Laterality Date  . Abdominal hysterectomy    . Cholecystectomy    . Total knee arthroplasty Right   . Rotator cuff repair Left   . Cataract extraction    . Percutaneous coronary stent intervention (pci-s)    . Carotid endarterectomy Left     There were no vitals filed for this visit.  Visit Diagnosis:  Difficulty walking  Weakness generalized      Subjective Assessment - 05/22/15 1409    Subjective Patient is feeling better today.         Therapeutic exericse:  Step ups x 10 bilaterally Tapping step x 10 x 2 Leg press with 45 lbs x 20, heel raises x 20 x 3 sets  Gait training with Rolator x 400 feet with CGA and turning 360 feet .  Patient has better standing tolerance. CGA and Min to mod verbal cues used throughout with increased in postural sway and LOB most seen with narrow base of support and while on uneven surfaces. Continues to have balance deficits typical with diagnosis. Patient performs intermediate level exercises without pain behaviors and needs verbal cuing for postural alignment and head positioning Tactile cues and  assistance needed to keep lower leg and knee in neutral to avoid compensations with ankle motions.                          PT Education - 05/22/15 1410    Education Details HEP   Comprehension Verbalized understanding             PT Long Term Goals - 04/22/15 1721    PT LONG TERM GOAL #1   Title Patient will complete a TUG test in <12 seconds without an AD for independent mobility and decreased fall risk in 8 weeks.  by 06/22/15   PT LONG TERM GOAL #2   Title Patient (>37 years old) will complete 5x/sit to stand test in < 14.2 seconds indicating a decreased likelihood of a balance dysfunction and improved LE strength in 8 weeks.  By 06/22/15   PT LONG TERM GOAL #3   Title Patient will be independent with ambulation with LRAD on unlevel surfaces without LOB for decreased fall risk in 4 weeks by 06/22/15               Plan - 05/22/15 1411    Clinical Impression Statement Patient has decreased strength and decreased standing balance and decreased ambulation with rollator.    Pt will benefit  from skilled therapeutic intervention in order to improve on the following deficits Abnormal gait;Difficulty walking;Decreased endurance;Decreased activity tolerance;Decreased balance;Decreased mobility;Decreased strength   Rehab Potential Fair   PT Frequency 2x / week   PT Duration 8 weeks   PT Treatment/Interventions Balance training;Therapeutic exercise;Therapeutic activities;Functional mobility training;Stair training;Gait training;Energy conservation   Consulted and Agree with Plan of Care Patient        Problem List Patient Active Problem List   Diagnosis Date Noted  . Chest pain 03/14/2015  . TIA (transient ischemic attack) 01/29/2015  . Allergic arthritis, hand     Alanson Puls 05/22/2015, 2:14 PM  [place] MAIN [facility] SERVICES [address] [place], [place], [address] Phone: [phone]   Fax:   [phone]

## 2015-05-23 DIAGNOSIS — D631 Anemia in chronic kidney disease: Secondary | ICD-10-CM | POA: Diagnosis not present

## 2015-05-23 DIAGNOSIS — I129 Hypertensive chronic kidney disease with stage 1 through stage 4 chronic kidney disease, or unspecified chronic kidney disease: Secondary | ICD-10-CM | POA: Diagnosis not present

## 2015-05-23 DIAGNOSIS — N189 Chronic kidney disease, unspecified: Secondary | ICD-10-CM | POA: Diagnosis not present

## 2015-05-27 ENCOUNTER — Ambulatory Visit: Payer: Medicare Other | Attending: Orthopedic Surgery | Admitting: Physical Therapy

## 2015-05-27 ENCOUNTER — Encounter: Payer: Self-pay | Admitting: Physical Therapy

## 2015-05-27 DIAGNOSIS — R531 Weakness: Secondary | ICD-10-CM | POA: Diagnosis not present

## 2015-05-27 DIAGNOSIS — R262 Difficulty in walking, not elsewhere classified: Secondary | ICD-10-CM | POA: Diagnosis not present

## 2015-05-27 NOTE — Therapy (Signed)
[place] MAIN [facility] SERVICES [address] [place], [place], [address] Phone: [phone]   Fax:  [phone]  Physical Therapy Treatment  Patient Details  Name: LLUVIA GWYNNE MRN: [id number] Date of Birth: [birth year] Referring Provider:  Sindy Messing, MD  Encounter Date: 05/27/2015      PT End of Session - 05/27/15 1452    Visit Number 7   Number of Visits 17   Date for PT Re-Evaluation 06/17/15   PT Start Time 0200   PT Stop Time 0255   PT Time Calculation (min) 55 min   Equipment Utilized During Treatment Gait belt   Activity Tolerance Patient limited by fatigue;Patient tolerated treatment well      Past Medical History  Diagnosis Date  . Hypertension   . Stroke ([place])   . RA (rheumatoid arthritis) ([place])   . Heart disease   . CML (chronic myelocytic leukemia) ([place])     Onc at [facility]    Past Surgical History  Procedure Laterality Date  . Abdominal hysterectomy    . Cholecystectomy    . Total knee arthroplasty Right   . Rotator cuff repair Left   . Cataract extraction    . Percutaneous coronary stent intervention (pci-s)    . Carotid endarterectomy Left     There were no vitals filed for this visit.  Visit Diagnosis:  Difficulty walking  Weakness generalized      Subjective Assessment - 05/27/15 1451    Subjective Patient is feeling better today.    Currently in Pain? No/denies         Therapeutic exercise : Leg press x 20 x 3 and heel raises with 45 lbs 20 x 3 Nu-step x 6 minutes ( not billed)  Step ups x 20 x 2 r  Tilt board without UE support x 1 minute x 5 reps side to side and fwd/bwds Standing on blue foam and head turns and blue foam and feet together  CGA needed for head turns and trunk rotation to maintain balance and upright posture., UE support and CGA needed throughout Airex step up task. SBA used throughout with increased in postural sway and LOB most seen with weight shifting  activities                         PT Education - 05/27/15 1451    Education provided Yes   Education Details HEP   Person(s) Educated Patient   Methods Explanation   Comprehension Verbalized understanding             PT Long Term Goals - 04/22/15 1721    PT LONG TERM GOAL #1   Title Patient will complete a TUG test in <12 seconds without an AD for independent mobility and decreased fall risk in 8 weeks.  by 06/22/15   PT LONG TERM GOAL #2   Title Patient (>79 years old) will complete 5x/sit to stand test in < 14.2 seconds indicating a decreased likelihood of a balance dysfunction and improved LE strength in 8 weeks.  By 06/22/15   PT LONG TERM GOAL #3   Title Patient will be independent with ambulation with LRAD on unlevel surfaces without LOB for decreased fall risk in 4 weeks by 06/22/15               Plan - 05/27/15 1452    Clinical Impression Statement CGA to SBA for safety with activities.  Uses to increase  intensity and amplitude of movements throughout session   Pt will benefit from skilled therapeutic intervention in order to improve on the following deficits Abnormal gait;Difficulty walking;Decreased endurance;Decreased activity tolerance;Decreased balance;Decreased mobility;Decreased strength   Rehab Potential Fair   PT Frequency 2x / week   PT Duration 8 weeks   PT Treatment/Interventions Balance training;Therapeutic exercise;Therapeutic activities;Functional mobility training;Stair training;Gait training;Energy conservation   Consulted and Agree with Plan of Care Patient        Problem List Patient Active Problem List   Diagnosis Date Noted  . Chest pain 03/14/2015  . TIA (transient ischemic attack) 01/29/2015  . Allergic arthritis, hand     Alanson Puls 05/27/2015, 2:54 PM  [place] MAIN [facility] SERVICES [address] [place], [place], [address] Phone: [phone]   Fax:   [phone]

## 2015-05-29 ENCOUNTER — Ambulatory Visit: Payer: Medicare Other | Admitting: Physical Therapy

## 2015-06-03 ENCOUNTER — Ambulatory Visit: Payer: Medicare Other | Admitting: Physical Therapy

## 2015-06-03 ENCOUNTER — Encounter: Payer: Self-pay | Admitting: Physical Therapy

## 2015-06-03 DIAGNOSIS — R262 Difficulty in walking, not elsewhere classified: Secondary | ICD-10-CM

## 2015-06-03 DIAGNOSIS — R531 Weakness: Secondary | ICD-10-CM

## 2015-06-03 NOTE — Therapy (Signed)
[place] MAIN [facility] SERVICES [address] [place], [place], [address] Phone: [phone]   Fax:  [phone]  Physical Therapy Treatment  Patient Details  Name: Stephanie Harris MRN: [id number] Date of Birth: May 16, 1931 Referring Provider:  Sindy Messing, MD  Encounter Date: 06/03/2015      PT End of Session - 06/03/15 1419    Visit Number (p) 8   Number of Visits (p) 17   Date for PT Re-Evaluation (p) 06/17/15   PT Start Time (p) 0200   PT Stop Time (p) 0245   PT Time Calculation (min) (p) 45 min   Equipment Utilized During Treatment (p) Gait belt   Activity Tolerance (p) Patient limited by fatigue;Patient tolerated treatment well      Past Medical History  Diagnosis Date  . Hypertension   . Stroke ([place])   . RA (rheumatoid arthritis) ([place])   . Heart disease   . CML (chronic myelocytic leukemia) ([place])     Onc at [facility]    Past Surgical History  Procedure Laterality Date  . Abdominal hysterectomy    . Cholecystectomy    . Total knee arthroplasty Right   . Rotator cuff repair Left   . Cataract extraction    . Percutaneous coronary stent intervention (pci-s)    . Carotid endarterectomy Left     There were no vitals filed for this visit.  Visit Diagnosis:  Difficulty walking  Weakness generalized      Subjective Assessment - 06/03/15 1416    Subjective Patient has been feeling sick all week and just got out of bed today.    Currently in Pain? No/denies   Pain Score 0-No pain      Therapeutic exercise:   standing hip abd with YTB x 20  side stepping left and right in parallel bars 10 feet x 3 standing on blue foam with cone reaching x 20 across midline step ups from floor to 6 inch stool x 20 bilateral sit to stand x 10 marching in parallel bars x 20 stepping pattern with weight shifting fwd/bwd x 10.  Patient needs occasional verbal cueing to improve posture and cueing to correctly perform exercises slowly,  holding at end of range to increase motor firing of desired muscle to encourage fatigue.                          PT Education - 06/03/15 1417    Education provided Yes   Education Details HEP   Person(s) Educated Patient   Methods Explanation   Comprehension Verbalized understanding             PT Long Term Goals - 04/22/15 1721    PT LONG TERM GOAL #1   Title Patient will complete a TUG test in <12 seconds without an AD for independent mobility and decreased fall risk in 8 weeks.  by 06/22/15   PT LONG TERM GOAL #2   Title Patient (>54 years old) will complete 5x/sit to stand test in < 14.2 seconds indicating a decreased likelihood of a balance dysfunction and improved LE strength in 8 weeks.  By 06/22/15   PT LONG TERM GOAL #3   Title Patient will be independent with ambulation with LRAD on unlevel surfaces without LOB for decreased fall risk in 4 weeks by 06/22/15               Plan - 06/03/15 1438    Clinical  Impression Statement Patients performance improves with practice and she uses UE to help support and for balance   Pt will benefit from skilled therapeutic intervention in order to improve on the following deficits Abnormal gait;Difficulty walking;Decreased endurance;Decreased activity tolerance;Decreased balance;Decreased mobility;Decreased strength   Rehab Potential Fair   PT Frequency 2x / week   PT Duration 8 weeks   PT Treatment/Interventions Balance training;Therapeutic exercise;Therapeutic activities;Functional mobility training;Stair training;Gait training;Energy conservation   Consulted and Agree with Plan of Care Patient        Problem List Patient Active Problem List   Diagnosis Date Noted  . Chest pain 03/14/2015  . TIA (transient ischemic attack) 01/29/2015  . Allergic arthritis, hand     Alanson Puls 06/03/2015, 2:40 PM  [place] MAIN [facility] SERVICES [address]  [place], [place], [address] Phone: [phone]   Fax:  [phone]

## 2015-06-05 ENCOUNTER — Ambulatory Visit: Payer: Medicare Other | Admitting: Physical Therapy

## 2015-06-05 DIAGNOSIS — R262 Difficulty in walking, not elsewhere classified: Secondary | ICD-10-CM

## 2015-06-05 DIAGNOSIS — R531 Weakness: Secondary | ICD-10-CM

## 2015-06-05 NOTE — Therapy (Signed)
[place] MAIN [facility] SERVICES [address] [place], [place], [address] Phone: [phone]   Fax:  [phone]  Physical Therapy Treatment  Patient Details  Name: Stephanie Harris MRN: [id number] Date of Birth: [birth year] Referring Provider:  Sindy Messing, MD  Encounter Date: 06/05/2015      PT End of Session - 06/05/15 1411    Visit Number 9   Number of Visits 17   Date for PT Re-Evaluation 06/17/15   PT Start Time 0200   Equipment Utilized During Treatment Gait belt   Activity Tolerance Patient limited by fatigue;Patient tolerated treatment well      Past Medical History  Diagnosis Date  . Hypertension   . Stroke ([place])   . RA (rheumatoid arthritis) ([place])   . Heart disease   . CML (chronic myelocytic leukemia) ([place])     Onc at [facility]    Past Surgical History  Procedure Laterality Date  . Abdominal hysterectomy    . Cholecystectomy    . Total knee arthroplasty Right   . Rotator cuff repair Left   . Cataract extraction    . Percutaneous coronary stent intervention (pci-s)    . Carotid endarterectomy Left     There were no vitals filed for this visit.  Visit Diagnosis:  Difficulty walking  Weakness generalized      Subjective Assessment - 06/05/15 1410    Subjective Patient is doing better today.    Currently in Pain? No/denies      standing hip abd with YTB x 20  side stepping left and right in parallel bars 10 feet x 3 standing on blue foam with cone reaching x 20 across midline step ups from floor to 6 inch stool x 20 bilateral sit to stand x 10 marching in parallel bars x 20 stepping pattern with weight shifting fwd/bwd x 10.  Patient continues to demonstrates less incoordination of movement with select exercises such as stepping backwards. Patient responds well to verbal and tactile cues to correct form and technique. Motor control of LE much improved.  Muscle fatigue but no major pain  complaints.                           PT Education - 06/05/15 1411    Education provided Yes   Education Details HEP   Person(s) Educated Patient   Methods Explanation   Comprehension Verbalized understanding             PT Long Term Goals - 04/22/15 1721    PT LONG TERM GOAL #1   Title Patient will complete a TUG test in <12 seconds without an AD for independent mobility and decreased fall risk in 8 weeks.  by 06/22/15   PT LONG TERM GOAL #2   Title Patient (>48 years old) will complete 5x/sit to stand test in < 14.2 seconds indicating a decreased likelihood of a balance dysfunction and improved LE strength in 8 weeks.  By 06/22/15   PT LONG TERM GOAL #3   Title Patient will be independent with ambulation with LRAD on unlevel surfaces without LOB for decreased fall risk in 4 weeks by 06/22/15               Plan - 06/05/15 1504    Clinical Impression Statement Muscle fatigue but no major pain complaints. Patient advancing to red theraband for exercises listed above.   Pt will benefit from skilled therapeutic intervention in  order to improve on the following deficits Abnormal gait;Difficulty walking;Decreased endurance;Decreased activity tolerance;Decreased balance;Decreased mobility;Decreased strength   Rehab Potential Fair   PT Frequency 2x / week   PT Duration 8 weeks   PT Treatment/Interventions Balance training;Therapeutic exercise;Therapeutic activities;Functional mobility training;Stair training;Gait training;Energy conservation   Consulted and Agree with Plan of Care Patient        Problem List Patient Active Problem List   Diagnosis Date Noted  . Chest pain 03/14/2015  . TIA (transient ischemic attack) 01/29/2015  . Allergic arthritis, hand     Alanson Puls 06/05/2015, 3:10 PM  [place] MAIN [facility] SERVICES [address] [place], [place], [address] Phone: [phone]   Fax:   [phone]

## 2015-06-06 DIAGNOSIS — C91Z0 Other lymphoid leukemia not having achieved remission: Secondary | ICD-10-CM | POA: Diagnosis not present

## 2015-06-06 DIAGNOSIS — D638 Anemia in other chronic diseases classified elsewhere: Secondary | ICD-10-CM | POA: Diagnosis not present

## 2015-06-11 ENCOUNTER — Encounter: Payer: Self-pay | Admitting: Physical Therapy

## 2015-06-11 ENCOUNTER — Ambulatory Visit: Payer: Medicare Other | Admitting: Physical Therapy

## 2015-06-11 DIAGNOSIS — R262 Difficulty in walking, not elsewhere classified: Secondary | ICD-10-CM | POA: Diagnosis not present

## 2015-06-11 DIAGNOSIS — R531 Weakness: Secondary | ICD-10-CM

## 2015-06-11 NOTE — Therapy (Signed)
[place] MAIN [facility] SERVICES [address] [place], [place], [address] Phone: [phone]   Fax:  [phone]  Physical Therapy Treatment  Patient Details  Name: Stephanie Harris MRN: [id number] Date of Birth: [birth year] No Data Recorded  Encounter Date: 06/11/2015      PT End of Session - 06/11/15 1344    Visit Number 10   Number of Visits 17   Date for PT Re-Evaluation 06/17/15   PT Start Time 0100   PT Stop Time 0145   PT Time Calculation (min) 45 min   Equipment Utilized During Treatment Gait belt   Activity Tolerance Patient limited by fatigue;Patient tolerated treatment well      Past Medical History  Diagnosis Date  . Hypertension   . Stroke ([place])   . RA (rheumatoid arthritis) ([place])   . Heart disease   . CML (chronic myelocytic leukemia) ([place])     Onc at [facility]    Past Surgical History  Procedure Laterality Date  . Abdominal hysterectomy    . Cholecystectomy    . Total knee arthroplasty Right   . Rotator cuff repair Left   . Cataract extraction    . Percutaneous coronary stent intervention (pci-s)    . Carotid endarterectomy Left     There were no vitals filed for this visit.  Visit Diagnosis:  Difficulty walking  Weakness generalized      Subjective Assessment - 06/11/15 1343    Subjective Patient is doing better today, but feels exhausted.        outcome measures:  5 x sit to stand = 40.47 sec  TUG= 28.35 sec 10 MW= . 48 m/sec    Therapeutic exercise including: standing hip abd with YTB x 20  side stepping left and right in parallel bars 10 feet x 3 standing on blue foam and tapping to steps sit to stand x 10 marching in parallel bars x 20 stepping pattern with weight shifting fwd/bwd x 10.                      PT Education - 06/11/15 1344    Education provided Yes   Education Details HEP   Person(s) Educated Patient   Methods Explanation   Comprehension Verbalized  understanding             PT Long Term Goals - 04/22/15 1721    PT LONG TERM GOAL #1   Title Patient will complete a TUG test in <12 seconds without an AD for independent mobility and decreased fall risk in 8 weeks.  by 06/22/15   PT LONG TERM GOAL #2   Title Patient (>13 years old) will complete 5x/sit to stand test in < 14.2 seconds indicating a decreased likelihood of a balance dysfunction and improved LE strength in 8 weeks.  By 06/22/15   PT LONG TERM GOAL #3   Title Patient will be independent with ambulation with LRAD on unlevel surfaces without LOB for decreased fall risk in 4 weeks by 06/22/15               Plan - 06/11/15 1506    Clinical Impression Statement Patient is able to improve all her outcome measures but continues to be frail and weak and has slow mobility. She has one more PT visit scheduled to review her HEP.    Pt will benefit from skilled therapeutic intervention in order to improve on the following deficits Abnormal gait;Difficulty walking;Decreased endurance;Decreased  activity tolerance;Decreased balance;Decreased mobility;Decreased strength   Rehab Potential Fair   PT Frequency 2x / week   PT Duration 8 weeks   PT Treatment/Interventions Balance training;Therapeutic exercise;Therapeutic activities;Functional mobility training;Stair training;Gait training;Energy conservation   Consulted and Agree with Plan of Care Patient        Problem List Patient Active Problem List   Diagnosis Date Noted  . Chest pain 03/14/2015  . TIA (transient ischemic attack) 01/29/2015  . Allergic arthritis, hand     Alanson Puls 06/11/2015, 3:09 PM  [place] MAIN [facility] SERVICES [address] [place], [place], [address] Phone: [phone]   Fax:  [phone]  Name: Stephanie Harris MRN: [id number] Date of Birth: [birth year]

## 2015-06-13 ENCOUNTER — Encounter: Payer: Self-pay | Admitting: Physical Therapy

## 2015-06-13 ENCOUNTER — Ambulatory Visit: Payer: Medicare Other | Admitting: Physical Therapy

## 2015-06-13 DIAGNOSIS — R531 Weakness: Secondary | ICD-10-CM

## 2015-06-13 DIAGNOSIS — R262 Difficulty in walking, not elsewhere classified: Secondary | ICD-10-CM | POA: Diagnosis not present

## 2015-06-13 NOTE — Therapy (Signed)
[place] MAIN [facility] SERVICES [address] [place], [place], [address] Phone: [phone]   Fax:  [phone]  Physical Therapy Treatment/ Discharge Summary  Patient Details  Name: Stephanie Harris MRN: [id number] Date of Birth: [birth year] No Data Recorded  Encounter Date: 06/13/2015      PT End of Session - 06/13/15 1325    Visit Number 11   Number of Visits 17   Date for PT Re-Evaluation 06/17/15   PT Start Time 0100   PT Stop Time 0145   PT Time Calculation (min) 45 min   Equipment Utilized During Treatment Gait belt   Activity Tolerance Patient limited by fatigue;Patient tolerated treatment well      Past Medical History  Diagnosis Date  . Hypertension   . Stroke ([place])   . RA (rheumatoid arthritis) ([place])   . Heart disease   . CML (chronic myelocytic leukemia) ([place])     Onc at [facility]    Past Surgical History  Procedure Laterality Date  . Abdominal hysterectomy    . Cholecystectomy    . Total knee arthroplasty Right   . Rotator cuff repair Left   . Cataract extraction    . Percutaneous coronary stent intervention (pci-s)    . Carotid endarterectomy Left     There were no vitals filed for this visit.  Visit Diagnosis:  Difficulty walking  Weakness generalized      Subjective Assessment - 06/13/15 1325    Subjective Patient is doing better today, but feels exhausted.      outcome measures:  5 x sit to stand  28.87 sec TUG  31.27 10 MW  . 45 m/sec   PT instructed Patient in standardized outcome measures to assess progress including the  5x STS, 10 m walk test, . See above for results. She continues to have a falls risk indicated by greater than 14 sec in TUG and 5 x sit to stand and slow gait speed.  PT provided min - moderate verbal instruction to improve set up, proper use of LE, and improved posture and gait mechanics. Patient responded moderately to instruction   Patient is being DC from therapy today.                         PT Education - 06/13/15 1325    Education provided Yes   Education Details HEP   Person(s) Educated Patient   Methods Explanation   Comprehension Verbalized understanding             PT Long Term Goals - 04/22/15 1721    PT LONG TERM GOAL #1   Title Patient will complete a TUG test in <12 seconds without an AD for independent mobility and decreased fall risk in 8 weeks.  by 06/22/15   PT LONG TERM GOAL #2   Title Patient (>53 years old) will complete 5x/sit to stand test in < 14.2 seconds indicating a decreased likelihood of a balance dysfunction and improved LE strength in 8 weeks.  By 06/22/15   PT LONG TERM GOAL #3   Title Patient will be independent with ambulation with LRAD on unlevel surfaces without LOB for decreased fall risk in 4 weeks by 06/22/15               Plan - 06/13/15 1326    Clinical Impression Statement PT provided min - moderate verbal instruction to improve set up, proper use of UE, and improved  posture and gait mechanics. Patient responded moderately to instruction   Pt will benefit from skilled therapeutic intervention in order to improve on the following deficits Abnormal gait;Difficulty walking;Decreased endurance;Decreased activity tolerance;Decreased balance;Decreased mobility;Decreased strength   Rehab Potential Fair   PT Frequency 2x / week   PT Duration 8 weeks   PT Treatment/Interventions Balance training;Therapeutic exercise;Therapeutic activities;Functional mobility training;Stair training;Gait training;Energy conservation   Consulted and Agree with Plan of Care Patient        Problem List Patient Active Problem List   Diagnosis Date Noted  . Chest pain 03/14/2015  . TIA (transient ischemic attack) 01/29/2015  . Allergic arthritis, hand     Alanson Puls 06/13/2015, 1:45 PM  [place] MAIN [facility] SERVICES [address] [place], [place],  [address] Phone: [phone]   Fax:  [phone]  Name: Stephanie Harris MRN: [id number] Date of Birth: Mar 05, 1931

## 2015-06-20 DIAGNOSIS — Z23 Encounter for immunization: Secondary | ICD-10-CM | POA: Diagnosis not present

## 2015-07-04 DIAGNOSIS — N189 Chronic kidney disease, unspecified: Secondary | ICD-10-CM | POA: Diagnosis not present

## 2015-07-04 DIAGNOSIS — D638 Anemia in other chronic diseases classified elsewhere: Secondary | ICD-10-CM | POA: Diagnosis not present

## 2015-07-04 DIAGNOSIS — C91Z0 Other lymphoid leukemia not having achieved remission: Secondary | ICD-10-CM | POA: Diagnosis not present

## 2015-08-01 DIAGNOSIS — D638 Anemia in other chronic diseases classified elsewhere: Secondary | ICD-10-CM | POA: Diagnosis not present

## 2015-08-01 DIAGNOSIS — C91Z0 Other lymphoid leukemia not having achieved remission: Secondary | ICD-10-CM | POA: Diagnosis not present

## 2015-08-15 DIAGNOSIS — D631 Anemia in chronic kidney disease: Secondary | ICD-10-CM | POA: Diagnosis not present

## 2015-08-15 DIAGNOSIS — I129 Hypertensive chronic kidney disease with stage 1 through stage 4 chronic kidney disease, or unspecified chronic kidney disease: Secondary | ICD-10-CM | POA: Diagnosis not present

## 2015-08-15 DIAGNOSIS — C91Z0 Other lymphoid leukemia not having achieved remission: Secondary | ICD-10-CM | POA: Diagnosis not present

## 2015-08-15 DIAGNOSIS — N189 Chronic kidney disease, unspecified: Secondary | ICD-10-CM | POA: Diagnosis not present

## 2015-08-30 DIAGNOSIS — N3945 Continuous leakage: Secondary | ICD-10-CM | POA: Insufficient documentation

## 2015-08-30 DIAGNOSIS — N390 Urinary tract infection, site not specified: Secondary | ICD-10-CM | POA: Diagnosis not present

## 2015-08-30 DIAGNOSIS — D638 Anemia in other chronic diseases classified elsewhere: Secondary | ICD-10-CM | POA: Diagnosis not present

## 2015-08-30 DIAGNOSIS — K5909 Other constipation: Secondary | ICD-10-CM | POA: Diagnosis not present

## 2015-09-12 DIAGNOSIS — D638 Anemia in other chronic diseases classified elsewhere: Secondary | ICD-10-CM | POA: Diagnosis not present

## 2015-09-12 DIAGNOSIS — I129 Hypertensive chronic kidney disease with stage 1 through stage 4 chronic kidney disease, or unspecified chronic kidney disease: Secondary | ICD-10-CM | POA: Diagnosis not present

## 2015-09-12 DIAGNOSIS — D649 Anemia, unspecified: Secondary | ICD-10-CM | POA: Diagnosis not present

## 2015-09-12 DIAGNOSIS — C91Z0 Other lymphoid leukemia not having achieved remission: Secondary | ICD-10-CM | POA: Diagnosis not present

## 2015-09-12 DIAGNOSIS — N181 Chronic kidney disease, stage 1: Secondary | ICD-10-CM | POA: Diagnosis not present

## 2015-09-12 DIAGNOSIS — N390 Urinary tract infection, site not specified: Secondary | ICD-10-CM | POA: Diagnosis not present

## 2015-09-26 DIAGNOSIS — I129 Hypertensive chronic kidney disease with stage 1 through stage 4 chronic kidney disease, or unspecified chronic kidney disease: Secondary | ICD-10-CM | POA: Diagnosis not present

## 2015-09-26 DIAGNOSIS — N189 Chronic kidney disease, unspecified: Secondary | ICD-10-CM | POA: Diagnosis not present

## 2015-09-26 DIAGNOSIS — D631 Anemia in chronic kidney disease: Secondary | ICD-10-CM | POA: Diagnosis not present

## 2015-09-30 ENCOUNTER — Emergency Department
Admission: EM | Admit: 2015-09-30 | Discharge: 2015-09-30 | Disposition: A | Discharge status: Home / Self Care | Payer: Medicare Other | Attending: Emergency Medicine | Admitting: Emergency Medicine

## 2015-09-30 ENCOUNTER — Emergency Department: Payer: Medicare Other

## 2015-09-30 ENCOUNTER — Encounter: Payer: Self-pay | Admitting: Medical Oncology

## 2015-09-30 DIAGNOSIS — R112 Nausea with vomiting, unspecified: Secondary | ICD-10-CM

## 2015-09-30 DIAGNOSIS — R197 Diarrhea, unspecified: Secondary | ICD-10-CM | POA: Diagnosis not present

## 2015-09-30 DIAGNOSIS — Z7984 Long term (current) use of oral hypoglycemic drugs: Secondary | ICD-10-CM | POA: Insufficient documentation

## 2015-09-30 DIAGNOSIS — R05 Cough: Secondary | ICD-10-CM | POA: Diagnosis not present

## 2015-09-30 DIAGNOSIS — J029 Acute pharyngitis, unspecified: Secondary | ICD-10-CM | POA: Insufficient documentation

## 2015-09-30 DIAGNOSIS — I10 Essential (primary) hypertension: Secondary | ICD-10-CM | POA: Diagnosis not present

## 2015-09-30 DIAGNOSIS — Z79899 Other long term (current) drug therapy: Secondary | ICD-10-CM | POA: Insufficient documentation

## 2015-09-30 DIAGNOSIS — Z880 Allergy status to penicillin: Secondary | ICD-10-CM | POA: Insufficient documentation

## 2015-09-30 DIAGNOSIS — Z7982 Long term (current) use of aspirin: Secondary | ICD-10-CM | POA: Insufficient documentation

## 2015-09-30 DIAGNOSIS — N390 Urinary tract infection, site not specified: Secondary | ICD-10-CM | POA: Diagnosis not present

## 2015-09-30 DIAGNOSIS — R509 Fever, unspecified: Secondary | ICD-10-CM | POA: Diagnosis not present

## 2015-09-30 DIAGNOSIS — R0602 Shortness of breath: Secondary | ICD-10-CM | POA: Diagnosis not present

## 2015-09-30 DIAGNOSIS — M6281 Muscle weakness (generalized): Secondary | ICD-10-CM | POA: Diagnosis not present

## 2015-09-30 DIAGNOSIS — A084 Viral intestinal infection, unspecified: Secondary | ICD-10-CM | POA: Diagnosis not present

## 2015-09-30 LAB — CBC
HCT: 35.2 % (ref 35.0–47.0)
Hemoglobin: 10.8 g/dL — ABNORMAL LOW (ref 12.0–16.0)
MCH: 18.3 pg — ABNORMAL LOW (ref 26.0–34.0)
MCHC: 30.7 g/dL — ABNORMAL LOW (ref 32.0–36.0)
MCV: 59.6 fL — ABNORMAL LOW (ref 80.0–100.0)
Platelets: 69 10*3/uL — ABNORMAL LOW (ref 150–440)
RBC: 5.91 MIL/uL — ABNORMAL HIGH (ref 3.80–5.20)
RDW: 17.2 % — ABNORMAL HIGH (ref 11.5–14.5)
WBC: 6.7 10*3/uL (ref 3.6–11.0)

## 2015-09-30 LAB — BASIC METABOLIC PANEL
Anion gap: 7 (ref 5–15)
BUN: 18 mg/dL (ref 6–20)
CO2: 27 mmol/L (ref 22–32)
Calcium: 9.6 mg/dL (ref 8.9–10.3)
Chloride: 104 mmol/L (ref 101–111)
Creatinine, Ser: 1.38 mg/dL — ABNORMAL HIGH (ref 0.44–1.00)
GFR calc Af Amer: 40 mL/min — ABNORMAL LOW (ref >60)
GFR calc non Af Amer: 35 mL/min — ABNORMAL LOW (ref >60)
Glucose, Bld: 92 mg/dL (ref 65–99)
Potassium: 3.4 mmol/L — ABNORMAL LOW (ref 3.5–5.1)
Sodium: 138 mmol/L (ref 135–145)

## 2015-09-30 LAB — TROPONIN I: Troponin I: <0.03 ng/mL (ref <0.031)

## 2015-09-30 MED ORDER — SODIUM CHLORIDE 0.9 % IV BOLUS (SEPSIS)
1000.0000 mL | Freq: Once | INTRAVENOUS | Status: AC
  Administered 2015-09-30: 1000 mL via INTRAVENOUS

## 2015-09-30 MED ORDER — ONDANSETRON HCL 4 MG/2ML IJ SOLN
4.0000 mg | Freq: Once | INTRAMUSCULAR | Status: AC
  Administered 2015-09-30: 4 mg via INTRAVENOUS
  Filled 2015-09-30: qty 2

## 2015-09-30 MED ORDER — ONDANSETRON 8 MG PO TBDP: 8.0000 mg | ORAL_TABLET | Freq: Three times a day (TID) | ORAL | Status: DC | PRN

## 2015-09-30 NOTE — Discharge Instructions (Signed)
Diarrhea Diarrhea is frequent loose and watery bowel movements. It can cause you to feel weak and dehydrated. Dehydration can cause you to become tired and thirsty, have a dry mouth, and have decreased urination that often is dark yellow. Diarrhea is a sign of another problem, most often an infection that will not last long. In most cases, diarrhea typically lasts 2-3 days. However, it can last longer if it is a sign of something more serious. It is important to treat your diarrhea as directed by your caregiver to lessen or prevent future episodes of diarrhea. CAUSES  Some common causes include:  Gastrointestinal infections caused by viruses, bacteria, or parasites.  Food poisoning or food allergies.  Certain medicines, such as antibiotics, chemotherapy, and laxatives.  Artificial sweeteners and fructose.  Digestive disorders. HOME CARE INSTRUCTIONS  Ensure adequate fluid intake (hydration): Have 1 cup (8 oz) of fluid for each diarrhea episode. Avoid fluids that contain simple sugars or sports drinks, fruit juices, whole milk products, and sodas. Your urine should be clear or pale yellow if you are drinking enough fluids. Hydrate with an oral rehydration solution that you can purchase at pharmacies, retail stores, and online. You can prepare an oral rehydration solution at home by mixing the following ingredients together:   - tsp table salt.   tsp baking soda.   tsp salt substitute containing potassium chloride.  1  tablespoons sugar.  1 L (34 oz) of water.  Certain foods and beverages may increase the speed at which food moves through the gastrointestinal (GI) tract. These foods and beverages should be avoided and include:  Caffeinated and alcoholic beverages.  High-fiber foods, such as raw fruits and vegetables, nuts, seeds, and whole grain breads and cereals.  Foods and beverages sweetened with sugar alcohols, such as xylitol, sorbitol, and mannitol.  Some foods may be well  tolerated and may help thicken stool including:  Starchy foods, such as rice, toast, pasta, low-sugar cereal, oatmeal, grits, baked potatoes, crackers, and bagels.  Bananas.  Applesauce.  Add probiotic-rich foods to help increase healthy bacteria in the GI tract, such as yogurt and fermented milk products.  Wash your hands well after each diarrhea episode.  Only take over-the-counter or prescription medicines as directed by your caregiver.  Take a warm bath to relieve any burning or pain from frequent diarrhea episodes. SEEK IMMEDIATE MEDICAL CARE IF:   You are unable to keep fluids down.  You have persistent vomiting.  You have blood in your stool, or your stools are black and tarry.  You do not urinate in 6-8 hours, or there is only a small amount of very dark urine.  You have abdominal pain that increases or localizes.  You have weakness, dizziness, confusion, or light-headedness.  You have a severe headache.  Your diarrhea gets worse or does not get better.  You have a fever or persistent symptoms for more than 2-3 days.  You have a fever and your symptoms suddenly get worse. MAKE SURE YOU:   Understand these instructions.  Will watch your condition.  Will get help right away if you are not doing well or get worse.   This information is not intended to replace advice given to you by your health care provider. Make sure you discuss any questions you have with your health care provider.   Document Released: 07/31/2002 Document Revised: 08/31/2014 Document Reviewed: 04/17/2012 Elsevier Interactive Patient Education 2016 Elsevier Inc[employer]  Nausea and Vomiting Nausea is a sick feeling that often comes  before throwing up (vomiting). Vomiting is a reflex where stomach contents come out of your mouth. Vomiting can cause severe loss of body fluids (dehydration). Children and elderly adults can become dehydrated quickly, especially if they also have diarrhea. Nausea and  vomiting are symptoms of a condition or disease. It is important to find the cause of your symptoms. CAUSES   Direct irritation of the stomach lining. This irritation can result from increased acid production (gastroesophageal reflux disease), infection, food poisoning, taking certain medicines (such as nonsteroidal anti-inflammatory drugs), alcohol use, or tobacco use.  Signals from the brain.These signals could be caused by a headache, heat exposure, an inner ear disturbance, increased pressure in the brain from injury, infection, a tumor, or a concussion, pain, emotional stimulus, or metabolic problems.  An obstruction in the gastrointestinal tract (bowel obstruction).  Illnesses such as diabetes, hepatitis, gallbladder problems, appendicitis, kidney problems, cancer, sepsis, atypical symptoms of a heart attack, or eating disorders.  Medical treatments such as chemotherapy and radiation.  Receiving medicine that makes you sleep (general anesthetic) during surgery. DIAGNOSIS Your caregiver may ask for tests to be done if the problems do not improve after a few days. Tests may also be done if symptoms are severe or if the reason for the nausea and vomiting is not clear. Tests may include:  Urine tests.  Blood tests.  Stool tests.  Cultures (to look for evidence of infection).  X-rays or other imaging studies. Test results can help your caregiver make decisions about treatment or the need for additional tests. TREATMENT You need to stay well hydrated. Drink frequently but in small amounts.You may wish to drink water, sports drinks, clear broth, or eat frozen ice pops or gelatin dessert to help stay hydrated.When you eat, eating slowly may help prevent nausea.There are also some antinausea medicines that may help prevent nausea. HOME CARE INSTRUCTIONS   Take all medicine as directed by your caregiver.  If you do not have an appetite, do not force yourself to eat. However, you must  continue to drink fluids.  If you have an appetite, eat a normal diet unless your caregiver tells you differently.  Eat a variety of complex carbohydrates (rice, wheat, potatoes, bread), lean meats, yogurt, fruits, and vegetables.  Avoid high-fat foods because they are more difficult to digest.  Drink enough water and fluids to keep your urine clear or pale yellow.  If you are dehydrated, ask your caregiver for specific rehydration instructions. Signs of dehydration may include:  Severe thirst.  Dry lips and mouth.  Dizziness.  Dark urine.  Decreasing urine frequency and amount.  Confusion.  Rapid breathing or pulse. SEEK IMMEDIATE MEDICAL CARE IF:   You have blood or brown flecks (like coffee grounds) in your vomit.  You have black or bloody stools.  You have a severe headache or stiff neck.  You are confused.  You have severe abdominal pain.  You have chest pain or trouble breathing.  You do not urinate at least once every 8 hours.  You develop cold or clammy skin.  You continue to vomit for longer than 24 to 48 hours.  You have a fever. MAKE SURE YOU:   Understand these instructions.  Will watch your condition.  Will get help right away if you are not doing well or get worse.   This information is not intended to replace advice given to you by your health care provider. Make sure you discuss any questions you have with your health care  provider.   Document Released: 08/10/2005 Document Revised: 11/02/2011 Document Reviewed: 01/07/2011 Elsevier Interactive Patient Education 2016 [employer].  Viral Gastroenteritis Viral gastroenteritis is also known as stomach flu. This condition affects the stomach and intestinal tract. It can cause sudden diarrhea and vomiting. The illness typically lasts 3 to 8 days. Most people develop an immune response that eventually gets rid of the virus. While this natural response develops, the virus can make you quite  ill. CAUSES  Many different viruses can cause gastroenteritis, such as rotavirus or noroviruses. You can catch one of these viruses by consuming contaminated food or water. You may also catch a virus by sharing utensils or other personal items with an infected person or by touching a contaminated surface. SYMPTOMS  The most common symptoms are diarrhea and vomiting. These problems can cause a severe loss of body fluids (dehydration) and a body salt (electrolyte) imbalance. Other symptoms may include:  Fever.  Headache.  Fatigue.  Abdominal pain. DIAGNOSIS  Your caregiver can usually diagnose viral gastroenteritis based on your symptoms and a physical exam. A stool sample may also be taken to test for the presence of viruses or other infections. TREATMENT  This illness typically goes away on its own. Treatments are aimed at rehydration. The most serious cases of viral gastroenteritis involve vomiting so severely that you are not able to keep fluids down. In these cases, fluids must be given through an intravenous line (IV). HOME CARE INSTRUCTIONS   Drink enough fluids to keep your urine clear or pale yellow. Drink small amounts of fluids frequently and increase the amounts as tolerated.  Ask your caregiver for specific rehydration instructions.  Avoid:  Foods high in sugar.  Alcohol.  Carbonated drinks.  Tobacco.  Juice.  Caffeine drinks.  Extremely hot or cold fluids.  Fatty, greasy foods.  Too much intake of anything at one time.  Dairy products until 24 to 48 hours after diarrhea stops.  You may consume probiotics. Probiotics are active cultures of beneficial bacteria. They may lessen the amount and number of diarrheal stools in adults. Probiotics can be found in yogurt with active cultures and in supplements.  Wash your hands well to avoid spreading the virus.  Only take over-the-counter or prescription medicines for pain, discomfort, or fever as directed by your  caregiver. Do not give aspirin to children. Antidiarrheal medicines are not recommended.  Ask your caregiver if you should continue to take your regular prescribed and over-the-counter medicines.  Keep all follow-up appointments as directed by your caregiver. SEEK IMMEDIATE MEDICAL CARE IF:   You are unable to keep fluids down.  You do not urinate at least once every 6 to 8 hours.  You develop shortness of breath.  You notice blood in your stool or vomit. This may look like coffee grounds.  You have abdominal pain that increases or is concentrated in one small area (localized).  You have persistent vomiting or diarrhea.  You have a fever.  The patient is a child younger than 3 months, and he or she has a fever.  The patient is a child older than 3 months, and he or she has a fever and persistent symptoms.  The patient is a child older than 3 months, and he or she has a fever and symptoms suddenly get worse.  The patient is a baby, and he or she has no tears when crying. MAKE SURE YOU:   Understand these instructions.  Will watch your condition.  Will get  help right away if you are not doing well or get worse.   This information is not intended to replace advice given to you by your health care provider. Make sure you discuss any questions you have with your health care provider.   Document Released: 08/10/2005 Document Revised: 11/02/2011 Document Reviewed: 05/27/2011 Elsevier Interactive Patient Education [employer].

## 2015-09-30 NOTE — ED Notes (Signed)
Pt reports she has had cough and fever since Friday along with vomiting.

## 2015-09-30 NOTE — ED Provider Notes (Signed)
[facility] Emergency Department Provider Note  ____________________________________________  Time seen: 1:25 PM  I have reviewed the triage vital signs and the nursing notes.   HISTORY  Chief Complaint Cough; Fever; and Emesis    HPI Stephanie Harris is a 82 y.o. female who complains of nonproductive cough, runny nose, fever for the past 3 days. She's also had nausea vomiting and diarrhea. She complains of some mild generalized abdominal pain as well. Denies chest pain or shortness of breath. She spoke with her primary care doctor who gave her azithromycin on Friday, but after taking that for the past few days her symptoms have not improved so she is recommended to come to the emergency department today for evaluation. No dizziness or syncope. Decreased oral intake due to nausea.  Has a history of CML and thalassemia and chronic anemia for which she is followed closely by [facility] hematology.     Past Medical History  Diagnosis Date  . Hypertension   . Stroke ([place])   . RA (rheumatoid arthritis) ([place])   . Heart disease   . CML (chronic myelocytic leukemia) ([place])     Onc at [facility]     Patient Active Problem List   Diagnosis Date Noted  . Chest pain 03/14/2015  . TIA (transient ischemic attack) 01/29/2015  . Allergic arthritis, hand      Past Surgical History  Procedure Laterality Date  . Abdominal hysterectomy    . Cholecystectomy    . Total knee arthroplasty Right   . Rotator cuff repair Left   . Cataract extraction    . Percutaneous coronary stent intervention (pci-s)    . Carotid endarterectomy Left      Current Outpatient Rx  Name  Route  Sig  Dispense  Refill  . amLODipine (NORVASC) 5 MG tablet   Oral   Take 1 tablet by mouth daily.         Marland Kitchen ascorbic acid (VITAMIN C) 1000 MG tablet   Oral   Take 1,000 mg by mouth daily.         Marland Kitchen aspirin EC 325 MG EC tablet   Oral   Take 1 tablet (325 mg total) by mouth daily.   30 tablet  0   . carvedilol (COREG) 3.125 MG tablet   Oral   Take 6.25 mg by mouth 2 (two) times daily.          . cholecalciferol (VITAMIN D) 1000 UNITS tablet   Oral   Take 1,000 Units by mouth daily.         . folic acid (FOLVITE) 1 MG tablet   Oral   Take 1 mg by mouth daily.         Marland Kitchen glimepiride (AMARYL) 1 MG tablet   Oral   Take 1 tablet by mouth daily with supper.         . levothyroxine (SYNTHROID, LEVOTHROID) 50 MCG tablet   Oral   Take 50 mcg by mouth every other day. On the opposite days from taking 24mcg.         Marland Kitchen levothyroxine (SYNTHROID, LEVOTHROID) 75 MCG tablet   Oral   Take 75 mcg by mouth every other day. On the opposite days from taking 72mcg         . ondansetron (ZOFRAN ODT) 8 MG disintegrating tablet   Oral   Take 1 tablet (8 mg total) by mouth every 8 (eight) hours as needed for nausea or vomiting.   [place]  tablet   0   . saccharomyces boulardii (FLORASTOR) 250 MG capsule   Oral   Take 1 capsule (250 mg total) by mouth 2 (two) times daily. Patient taking differently: Take 250 mg by mouth as needed.    30 capsule   0   . vitamin E 400 UNIT capsule   Oral   Take 400 Units by mouth daily.            Allergies Penicillins   Family History  Problem Relation Age of Onset  . Acute myelogenous leukemia Brother     Social History Social History  Substance Use Topics  . Smoking status: Never Smoker   . Smokeless tobacco: None  . Alcohol Use: No    Review of Systems  Constitutional:   No fever or chills. No weight changes Eyes:   No blurry vision or double vision.  ENT:   Positive sore throat. Cardiovascular:   No chest pain. Respiratory:   No dyspnea positive nonproductive cough. Gastrointestinal:   Positive generalized abdominal pain, with vomiting and diarrhea.  No BRBPR or melena. Genitourinary:   Negative for dysuria, urinary retention, bloody urine, or difficulty urinating. Chronic UTIs, followed by urology. No acute changes in  symptoms Musculoskeletal:   Negative for back pain. No joint swelling or pain. Skin:   Negative for rash. Neurological:   Negative for headaches, focal weakness or numbness. Psychiatric:  No anxiety or depression.   Endocrine:  No hot/cold intolerance, changes in energy, or sleep difficulty.  10-point ROS otherwise negative.  ____________________________________________   PHYSICAL EXAM:  VITAL SIGNS: ED Triage Vitals  Enc Vitals Group     BP 09/30/15 1208 117/67 mmHg     Pulse Rate 09/30/15 1208 96     Resp 09/30/15 1208 20     Temp 09/30/15 1208 97.9 F (36.6 C)     Temp Source 09/30/15 1208 Oral     SpO2 09/30/15 1208 97 %     Weight 09/30/15 1208 123 lb (55.792 kg)     Height 09/30/15 1208 5' (1.524 m)     Head Cir --      Peak Flow --      Pain Score 09/30/15 1210 8     Pain Loc --      Pain Edu? --      Excl. in [place]? --     Vital signs reviewed, nursing assessments reviewed.   Constitutional:   Alert and oriented. Well appearing and in no distress. Eyes:   No scleral icterus. No conjunctival pallor. PERRL. EOMI ENT   Head:   Normocephalic and atraumatic.   Nose:   No congestion/rhinnorhea. No septal hematoma   Mouth/Throat:   MMM, no pharyngeal erythema. No peritonsillar mass. No uvula shift.   Neck:   No stridor. No SubQ emphysema. No meningismus. Hematological/Lymphatic/Immunilogical:   No cervical lymphadenopathy. Cardiovascular:   RRR. Normal and symmetric distal pulses are present in all extremities. No murmurs, rubs, or gallops. Respiratory:   Normal respiratory effort without tachypnea nor retractions. Breath sounds are clear and equal bilaterally. No wheezes/rales/rhonchi. Gastrointestinal:   Soft and nontender. No distention. There is no CVA tenderness.  No rebound, rigidity, or guarding. Genitourinary:   deferred Musculoskeletal:   Nontender with normal range of motion in all extremities. No joint effusions.  No lower extremity tenderness.   No edema. Neurologic:   Normal speech and language.  CN 2-10 normal. Motor grossly intact. No pronator drift.  Normal gait. No gross focal  neurologic deficits are appreciated.  Skin:    Skin is warm, dry and intact. No rash noted.  No petechiae, purpura, or bullae. Psychiatric:   Mood and affect are normal. Speech and behavior are normal. Patient exhibits appropriate insight and judgment.  ____________________________________________    LABS (pertinent positives/negatives) (all labs ordered are listed, but only abnormal results are displayed) Labs Reviewed  BASIC METABOLIC PANEL - Abnormal; Notable for the following:    Potassium 3.4 (*)    Creatinine, Ser 1.38 (*)    GFR calc non Af Amer 35 (*)    GFR calc Af Amer 40 (*)    All other components within normal limits  CBC - Abnormal; Notable for the following:    RBC 5.91 (*)    Hemoglobin 10.8 (*)    MCV 59.6 (*)    MCH 18.3 (*)    MCHC 30.7 (*)    RDW 17.2 (*)    All other components within normal limits  TROPONIN I   ____________________________________________   EKG  Interpreted by me  Date: 09/30/2015  Rate: 93  Rhythm: normal sinus rhythm  QRS Axis: normal  Intervals: normal  ST/T Wave abnormalities: normal  Conduction Disutrbances: none  Narrative Interpretation: unremarkable      ____________________________________________    RADIOLOGY  Chest x-ray unremarkable  ____________________________________________   PROCEDURES   ____________________________________________   INITIAL IMPRESSION / ASSESSMENT AND PLAN / ED COURSE  Pertinent labs & imaging results that were available during my care of the patient were reviewed by me and considered in my medical decision making (see chart for details).  Patient presents with constellation of symptoms consistent with acute viral illness, likely norovirus or rotavirus.Considering the patient's symptoms, medical history, and physical examination today,  I have low suspicion for cholecystitis or biliary pathology, pancreatitis, perforation or bowel obstruction, hernia, intra-abdominal abscess, AAA or dissection, volvulus or intussusception, or appendicitis.  Low suspicion for pneumonia or sepsis. No evidence of mesenteric ischemia. We'll give IV fluids, check labs and chest x-ray.  ----------------------------------------- 2:21 PM on 09/30/2015 -----------------------------------------  Workup unremarkable. Very slight elevation of creatinine compared to baseline, consistent with very mild dehydration. Patient is very well-appearing no acute distress normal vital signs. We'll discharge home with Zofran for nausea control, close follow-up with PCP.    ____________________________________________   FINAL CLINICAL IMPRESSION(S) / ED DIAGNOSES  Final diagnoses:  Viral gastroenteritis  Nausea vomiting and diarrhea      [name], MD 09/30/15 1422

## 2015-10-01 DIAGNOSIS — R399 Unspecified symptoms and signs involving the genitourinary system: Secondary | ICD-10-CM | POA: Diagnosis not present

## 2015-10-01 DIAGNOSIS — N390 Urinary tract infection, site not specified: Secondary | ICD-10-CM | POA: Diagnosis not present

## 2015-10-10 DIAGNOSIS — C91Z0 Other lymphoid leukemia not having achieved remission: Secondary | ICD-10-CM | POA: Diagnosis not present

## 2015-10-10 DIAGNOSIS — D638 Anemia in other chronic diseases classified elsewhere: Secondary | ICD-10-CM | POA: Diagnosis not present

## 2015-10-15 DIAGNOSIS — J45991 Cough variant asthma: Secondary | ICD-10-CM | POA: Diagnosis not present

## 2015-10-15 DIAGNOSIS — I10 Essential (primary) hypertension: Secondary | ICD-10-CM | POA: Diagnosis not present

## 2015-10-15 DIAGNOSIS — E119 Type 2 diabetes mellitus without complications: Secondary | ICD-10-CM | POA: Diagnosis not present

## 2015-10-15 DIAGNOSIS — M6281 Muscle weakness (generalized): Secondary | ICD-10-CM | POA: Diagnosis not present

## 2015-10-15 DIAGNOSIS — E039 Hypothyroidism, unspecified: Secondary | ICD-10-CM | POA: Diagnosis not present

## 2015-10-24 DIAGNOSIS — N189 Chronic kidney disease, unspecified: Secondary | ICD-10-CM | POA: Diagnosis not present

## 2015-10-24 DIAGNOSIS — D631 Anemia in chronic kidney disease: Secondary | ICD-10-CM | POA: Diagnosis not present

## 2015-10-30 ENCOUNTER — Emergency Department: Payer: Medicare Other

## 2015-10-30 ENCOUNTER — Encounter: Payer: Self-pay | Admitting: Emergency Medicine

## 2015-10-30 ENCOUNTER — Emergency Department
Admission: EM | Admit: 2015-10-30 | Discharge: 2015-10-30 | Disposition: A | Discharge status: Home / Self Care | Payer: Medicare Other | Attending: Emergency Medicine | Admitting: Emergency Medicine

## 2015-10-30 DIAGNOSIS — R55 Syncope and collapse: Secondary | ICD-10-CM | POA: Diagnosis not present

## 2015-10-30 DIAGNOSIS — J0130 Acute sphenoidal sinusitis, unspecified: Secondary | ICD-10-CM | POA: Diagnosis not present

## 2015-10-30 DIAGNOSIS — Z7982 Long term (current) use of aspirin: Secondary | ICD-10-CM | POA: Diagnosis not present

## 2015-10-30 DIAGNOSIS — I10 Essential (primary) hypertension: Secondary | ICD-10-CM | POA: Insufficient documentation

## 2015-10-30 DIAGNOSIS — Z880 Allergy status to penicillin: Secondary | ICD-10-CM | POA: Insufficient documentation

## 2015-10-30 DIAGNOSIS — Z79899 Other long term (current) drug therapy: Secondary | ICD-10-CM | POA: Diagnosis not present

## 2015-10-30 DIAGNOSIS — R42 Dizziness and giddiness: Secondary | ICD-10-CM | POA: Diagnosis not present

## 2015-10-30 DIAGNOSIS — J9811 Atelectasis: Secondary | ICD-10-CM | POA: Diagnosis not present

## 2015-10-30 DIAGNOSIS — R4781 Slurred speech: Secondary | ICD-10-CM | POA: Diagnosis not present

## 2015-10-30 DIAGNOSIS — R29810 Facial weakness: Secondary | ICD-10-CM | POA: Diagnosis not present

## 2015-10-30 LAB — COMPREHENSIVE METABOLIC PANEL
ALT: 19 U/L (ref 14–54)
AST: 31 U/L (ref 15–41)
Albumin: 4 g/dL (ref 3.5–5.0)
Alkaline Phosphatase: 30 U/L — ABNORMAL LOW (ref 38–126)
Anion gap: 10 (ref 5–15)
BUN: 23 mg/dL — ABNORMAL HIGH (ref 6–20)
CO2: 24 mmol/L (ref 22–32)
Calcium: 9.8 mg/dL (ref 8.9–10.3)
Chloride: 103 mmol/L (ref 101–111)
Creatinine, Ser: 1.23 mg/dL — ABNORMAL HIGH (ref 0.44–1.00)
GFR calc Af Amer: 46 mL/min — ABNORMAL LOW (ref >60)
GFR calc non Af Amer: 40 mL/min — ABNORMAL LOW (ref >60)
Glucose, Bld: 117 mg/dL — ABNORMAL HIGH (ref 65–99)
Potassium: 4.7 mmol/L (ref 3.5–5.1)
Sodium: 137 mmol/L (ref 135–145)
Total Bilirubin: 2.2 mg/dL — ABNORMAL HIGH (ref 0.3–1.2)
Total Protein: 7.7 g/dL (ref 6.5–8.1)

## 2015-10-30 LAB — CBC WITH DIFFERENTIAL/PLATELET
Basophils Absolute: 0.1 10*3/uL (ref 0–0.1)
Basophils Relative: 1 %
Eosinophils Absolute: 0 10*3/uL (ref 0–0.7)
Eosinophils Relative: 0 %
HCT: 35.4 % (ref 35.0–47.0)
Hemoglobin: 11.1 g/dL — ABNORMAL LOW (ref 12.0–16.0)
Lymphocytes Relative: 30 %
Lymphs Abs: 2.6 10*3/uL (ref 1.0–3.6)
MCH: 19.2 pg — ABNORMAL LOW (ref 26.0–34.0)
MCHC: 31.4 g/dL — ABNORMAL LOW (ref 32.0–36.0)
MCV: 61.2 fL — ABNORMAL LOW (ref 80.0–100.0)
Monocytes Absolute: 1.9 10*3/uL — ABNORMAL HIGH (ref 0.2–0.9)
Monocytes Relative: 22 %
Neutro Abs: 4.1 10*3/uL (ref 1.4–6.5)
Neutrophils Relative %: 47 %
Platelets: 86 10*3/uL — ABNORMAL LOW (ref 150–440)
RBC: 5.78 MIL/uL — ABNORMAL HIGH (ref 3.80–5.20)
RDW: 18.5 % — ABNORMAL HIGH (ref 11.5–14.5)
WBC: 8.7 10*3/uL (ref 3.6–11.0)

## 2015-10-30 LAB — TROPONIN I: Troponin I: <0.03 ng/mL (ref <0.031)

## 2015-10-30 NOTE — ED Provider Notes (Signed)
-----------------------------------------   5:46 PM on 10/30/2015 -----------------------------------------   Blood pressure 105/56, pulse 75, temperature 97.9 F (36.6 C), temperature source Oral, resp. rate 29, height 5' (1.524 m), weight 128 lb 14.4 oz (58.469 kg), SpO2 99 %.  Assuming care from Dr. Cinda Quest.  In short, Stephanie Harris is a 82 y.o. female with a chief complaint of Near Syncope .  Refer to the original H&P for additional details.  The current plan of care is to follow the patient's results. I spoke to [name] at the bedside and she feels symptomatically improved. Based on her description of events it seems that she had a vasovagal syncopal episode. He denies any preexistent chest pain, etc. She states that she did feel lightheaded before she had the near syncope versus brief syncopal episode. She states that she was sitting on the commode and straining to have a bowel movement when the event occurred. The patient has follow-up with the cardiologist tomorrow for routine evaluation she was advised to review her visit with him from the emergency department. At this time I don't see a reason to adjust any of her medication and her current outpatient plan. All questions and concerns were addressed at the bedside and the patient felt symptomatically improved   Daymon Larsen, MD 10/30/15 1747

## 2015-10-30 NOTE — ED Provider Notes (Signed)
[facility] Emergency Department Provider Note  ____________________________________________  Time seen: Approximately 3:45 PM  I have reviewed the triage vital signs and the nursing notes.   HISTORY  Chief Complaint Near Syncope    HPI Stephanie Harris is a 82 y.o. female patient reports she's had repeated TIAs in the last year. Last Thursday she had another TIA with right hand numbness tingling around the mouth and right arm weakness. Since then she's had a headache. She came in today because she was sitting on the toilet having a large bowel movement and became woozy and lightheaded called her husband and her husband got there and found her unconscious sitting on the toilet. She was breathing and had a pulse. She woke up quickly and was normal but came in especially since she felt weak. Patient reports she feels achy all over like she's been shot or beaten with a stick. She has a cough productive of some white phlegm and no other complaints. She is not aware of having any fever has not had any chest pain nausea or vomiting.  Past Medical History  Diagnosis Date  . Hypertension   . Stroke ([place])   . RA (rheumatoid arthritis) ([place])   . Heart disease   . CML (chronic myelocytic leukemia) ([place])     Onc at [facility]    Patient Active Problem List   Diagnosis Date Noted  . Chest pain 03/14/2015  . TIA (transient ischemic attack) 01/29/2015  . Allergic arthritis, hand     Past Surgical History  Procedure Laterality Date  . Abdominal hysterectomy    . Cholecystectomy    . Total knee arthroplasty Right   . Rotator cuff repair Left   . Cataract extraction    . Percutaneous coronary stent intervention (pci-s)    . Carotid endarterectomy Left     Current Outpatient Rx  Name  Route  Sig  Dispense  Refill  . amLODipine (NORVASC) 5 MG tablet   Oral   Take 1 tablet by mouth daily.         Marland Kitchen ascorbic acid (VITAMIN C) 1000 MG tablet   Oral   Take 1,000 mg by  mouth daily.         Marland Kitchen aspirin EC 325 MG EC tablet   Oral   Take 1 tablet (325 mg total) by mouth daily.   30 tablet   0   . carvedilol (COREG) 3.125 MG tablet   Oral   Take 6.25 mg by mouth 2 (two) times daily.          . cholecalciferol (VITAMIN D) 1000 UNITS tablet   Oral   Take 1,000 Units by mouth daily.         . folic acid (FOLVITE) 1 MG tablet   Oral   Take 1 mg by mouth daily.         Marland Kitchen glimepiride (AMARYL) 1 MG tablet   Oral   Take 1 tablet by mouth daily with supper.         . levothyroxine (SYNTHROID, LEVOTHROID) 50 MCG tablet   Oral   Take 50 mcg by mouth every other day. On the opposite days from taking 41mcg.         Marland Kitchen levothyroxine (SYNTHROID, LEVOTHROID) 75 MCG tablet   Oral   Take 75 mcg by mouth every other day. On the opposite days from taking 13mcg         . ondansetron (ZOFRAN ODT) 8 MG disintegrating  tablet   Oral   Take 1 tablet (8 mg total) by mouth every 8 (eight) hours as needed for nausea or vomiting.   20 tablet   0   . saccharomyces boulardii (FLORASTOR) 250 MG capsule   Oral   Take 1 capsule (250 mg total) by mouth 2 (two) times daily. Patient taking differently: Take 250 mg by mouth as needed.    30 capsule   0   . vitamin E 400 UNIT capsule   Oral   Take 400 Units by mouth daily.           Allergies Penicillins  Family History  Problem Relation Age of Onset  . Acute myelogenous leukemia Brother     Social History Social History  Substance Use Topics  . Smoking status: Never Smoker   . Smokeless tobacco: None  . Alcohol Use: No    Review of Systems Constitutional: No fever/chills Eyes: No visual changes. ENT: No sore throat. Cardiovascular: Denies chest pain. Respiratory: Denies shortness of breath. Gastrointestinal: No abdominal pain.  No nausea, no vomiting.  No diarrhea.  No constipation. Genitourinary: Negative for dysuria. Musculoskeletal: Negative for back pain. Skin: Negative for  rash. Neurological:See history of present illness  10-point ROS otherwise negative.  ____________________________________________   PHYSICAL EXAM:  VITAL SIGNS: ED Triage Vitals  Enc Vitals Group     BP 10/30/15 1442 127/60 mmHg     Pulse Rate 10/30/15 1442 73     Resp 10/30/15 1442 18     Temp 10/30/15 1442 97.9 F (36.6 C)     Temp Source 10/30/15 1442 Oral     SpO2 10/30/15 1442 100 %     Weight 10/30/15 1442 128 lb 14.4 oz (58.469 kg)     Height 10/30/15 1442 5' (1.524 m)     Head Cir --      Peak Flow --      Pain Score --      Pain Loc --      Pain Edu? --      Excl. in [place]? --     Constitutional: Alert and oriented. Well appearing and in no acute distress. Eyes: Conjunctivae are normal. PERRL. EOMI. Head: Atraumatic. Nose: No congestion/rhinnorhea. Mouth/Throat: Mucous membranes are moist.  Oropharynx non-erythematous. Neck: No stridor.  Cardiovascular: Normal rate, regular rhythm. Grossly normal heart sounds.  Good peripheral circulation. Respiratory: Normal respiratory effort.  No retractions. Lungs CTAB. Gastrointestinal: Soft and nontender. No distention. No abdominal bruits. No CVA tenderness. Musculoskeletal: No lower extremity tenderness nor edema.  No joint effusions. Neurologic:  Normal speech and language. No gross focal neurologic deficits are appreciated. No gait instability. Skin:  Skin is warm, dry and intact. No rash noted. Psychiatric: Mood and affect are normal. Speech and behavior are normal.  ____________________________________________   LABS (all labs ordered are listed, but only abnormal results are displayed)  Labs Reviewed  COMPREHENSIVE METABOLIC PANEL  TROPONIN I  CBC WITH DIFFERENTIAL/PLATELET  BRAIN NATRIURETIC PEPTIDE   ____________________________________________  EKG  EKG read and interpreted by me shows normal sinus rhythm rate of 79 left axis nonspecific ST-T wave  changes ____________________________________________  RADIOLOGY  Chest x-ray is pending. CT of the head shows an air-fluid level in the sphenoid sinus consistent with acute sphenoid sinusitis per radiology. ____________________________________________   PROCEDURES   ____________________________________________   INITIAL IMPRESSION / ASSESSMENT AND PLAN / ED COURSE  Pertinent labs & imaging results that were available during my care of the patient were reviewed  by me and considered in my medical decision making (see chart for details). Lab work is pending chest x-ray is pending patient be signed out to Dr. Marcelene Butte ____________________________________________   FINAL CLINICAL IMPRESSION(S) / ED DIAGNOSES  Final diagnoses:  Acute sphenoidal sinusitis, recurrence not specified  Syncope, unspecified syncope type      Nena Polio, MD 10/30/15 1551

## 2015-10-30 NOTE — Discharge Instructions (Signed)
Syncope, commonly known as fainting, is a temporary loss of consciousness. It occurs when the blood flow to the brain is reduced. Vasovagal syncope (also called neurocardiogenic syncope) is a fainting spell in which the blood flow to the brain is reduced because of a sudden drop in heart rate and blood pressure. Vasovagal syncope occurs when the brain and the cardiovascular system (blood vessels) do not adequately communicate and respond to each other. This is the most common cause of fainting. It often occurs in response to fear or some other type of emotional or physical stress. The body has a reaction in which the heart starts beating too slowly or the blood vessels expand, reducing blood pressure. This type of fainting spell is generally considered harmless. However, injuries can occur if a person takes a sudden fall during a fainting spell.   CAUSES   Vasovagal syncope occurs when a person's blood pressure and heart rate decrease suddenly, usually in response to a trigger. Many things and situations can trigger an episode. Some of these include:    Pain.    Fear.    The sight of blood or medical procedures, such as blood being drawn from a vein.    Common activities, such as coughing, swallowing, stretching, or going to the bathroom.    Emotional stress.    Prolonged standing, especially in a warm environment.    Lack of sleep or rest.    Prolonged lack of food.    Prolonged lack of fluids.    Recent illness.   The use of certain drugs that affect blood pressure, such as cocaine, alcohol, marijuana, inhalants, and opiates.   SYMPTOMS   Before the fainting episode, you may:    Feel dizzy or light headed.    Become pale.   Sense that you are going to faint.    Feel like the room is spinning.    Have tunnel vision, only seeing directly in front of you.    Feel sick to your stomach (nauseous).    See spots or slowly lose vision.    Hear ringing in your ears.    Have a headache.     Feel warm and sweaty.    Feel a sensation of pins and needles.  During the fainting spell, you will generally be unconscious for no longer than a couple minutes before waking up and returning to normal. If you get up too quickly before your body can recover, you may faint again. Some twitching or jerky movements may occur during the fainting spell.   DIAGNOSIS   Your health care provider will ask about your symptoms, take a medical history, and perform a physical exam. Various tests may be done to rule out other causes of fainting. These may include blood tests and tests to check the heart, such as electrocardiography, echocardiography, and possibly an electrophysiology study. When other causes have been ruled out, a test may be done to check the body's response to changes in position (tilt table test).  TREATMENT   Most cases of vasovagal syncope do not require treatment. Your health care provider may recommend ways to avoid fainting triggers and may provide home strategies for preventing fainting. If you must be exposed to a possible trigger, you can drink additional fluids to help reduce your chances of having an episode of vasovagal syncope. If you have warning signs of an oncoming episode, you can respond by positioning yourself favorably (lying down).  If your fainting spells continue, you may be   given medicines to prevent fainting. Some medicines may help make you more resistant to repeated episodes of vasovagal syncope. Special exercises or compression stockings may be recommended. In rare cases, the surgical placement of a pacemaker is considered.  HOME CARE INSTRUCTIONS    Learn to identify the warning signs of vasovagal syncope.    Sit or lie down at the first warning sign of a fainting spell. If sitting, put your head down between your legs. If you lie down, swing your legs up in the air to increase blood flow to the brain.    Avoid hot tubs and saunas.   Avoid prolonged standing.   Drink  enough fluids to keep your urine clear or pale yellow. Avoid caffeine.   Increase salt in your diet as directed by your health care provider.    If you have to stand for a long time, perform movements such as:     Crossing your legs.     Flexing and stretching your leg muscles.     Squatting.     Moving your legs.     Bending over.    Only take over-the-counter or prescription medicines as directed by your health care provider. Do not suddenly stop any medicines without asking your health care provider first.  SEEK MEDICAL CARE IF:    Your fainting spells continue or happen more frequently in spite of treatment.    You lose consciousness for more than a couple minutes.   You have fainting spells during or after exercising or after being startled.    You have new symptoms that occur with the fainting spells, such as:     Shortness of breath.    Chest pain.     Irregular heartbeat.    You have episodes of twitching or jerky movements that last longer than a few seconds.   You have episodes of twitching or jerky movements without obvious fainting.  SEEK IMMEDIATE MEDICAL CARE IF:    You have injuries or bleeding after a fainting spell.    You have episodes of twitching or jerky movements that last longer than 5 minutes.    You have more than one spell of twitching or jerky movements before returning to consciousness after fainting.     This information is not intended to replace advice given to you by your health care provider. Make sure you discuss any questions you have with your health care provider.     Document Released: 07/27/2012 Document Revised: 12/25/2014 Document Reviewed: 07/27/2012  Elsevier Interactive Patient Education 2016 [employer]

## 2015-10-30 NOTE — ED Notes (Signed)
Pt presents to ER from home via EMS for near syncopal episode. Pt was on the toilet and felt weak "I got real sick". A&O x4

## 2015-10-31 DIAGNOSIS — I340 Nonrheumatic mitral (valve) insufficiency: Secondary | ICD-10-CM | POA: Diagnosis not present

## 2015-10-31 DIAGNOSIS — R55 Syncope and collapse: Secondary | ICD-10-CM | POA: Diagnosis not present

## 2015-10-31 DIAGNOSIS — I2510 Atherosclerotic heart disease of native coronary artery without angina pectoris: Secondary | ICD-10-CM | POA: Diagnosis not present

## 2015-10-31 DIAGNOSIS — I6523 Occlusion and stenosis of bilateral carotid arteries: Secondary | ICD-10-CM | POA: Diagnosis not present

## 2015-11-06 DIAGNOSIS — R55 Syncope and collapse: Secondary | ICD-10-CM | POA: Diagnosis not present

## 2015-11-07 DIAGNOSIS — C91Z0 Other lymphoid leukemia not having achieved remission: Secondary | ICD-10-CM | POA: Diagnosis not present

## 2015-11-07 DIAGNOSIS — D638 Anemia in other chronic diseases classified elsewhere: Secondary | ICD-10-CM | POA: Diagnosis not present

## 2015-11-13 DIAGNOSIS — D0439 Carcinoma in situ of skin of other parts of face: Secondary | ICD-10-CM | POA: Diagnosis not present

## 2015-11-13 DIAGNOSIS — D492 Neoplasm of unspecified behavior of bone, soft tissue, and skin: Secondary | ICD-10-CM | POA: Diagnosis not present

## 2015-11-13 DIAGNOSIS — L821 Other seborrheic keratosis: Secondary | ICD-10-CM | POA: Diagnosis not present

## 2015-11-15 DIAGNOSIS — I6523 Occlusion and stenosis of bilateral carotid arteries: Secondary | ICD-10-CM | POA: Diagnosis not present

## 2015-11-15 DIAGNOSIS — R55 Syncope and collapse: Secondary | ICD-10-CM | POA: Diagnosis not present

## 2015-11-15 DIAGNOSIS — I10 Essential (primary) hypertension: Secondary | ICD-10-CM | POA: Diagnosis not present

## 2015-11-15 DIAGNOSIS — E782 Mixed hyperlipidemia: Secondary | ICD-10-CM | POA: Diagnosis not present

## 2015-11-15 DIAGNOSIS — I2510 Atherosclerotic heart disease of native coronary artery without angina pectoris: Secondary | ICD-10-CM | POA: Diagnosis not present

## 2015-11-22 DIAGNOSIS — R32 Unspecified urinary incontinence: Secondary | ICD-10-CM | POA: Diagnosis not present

## 2015-11-22 DIAGNOSIS — Z90710 Acquired absence of both cervix and uterus: Secondary | ICD-10-CM | POA: Diagnosis not present

## 2015-11-22 DIAGNOSIS — K5909 Other constipation: Secondary | ICD-10-CM | POA: Diagnosis not present

## 2015-11-22 DIAGNOSIS — K5900 Constipation, unspecified: Secondary | ICD-10-CM | POA: Diagnosis not present

## 2015-11-22 DIAGNOSIS — N390 Urinary tract infection, site not specified: Secondary | ICD-10-CM | POA: Diagnosis not present

## 2015-11-22 DIAGNOSIS — N3945 Continuous leakage: Secondary | ICD-10-CM | POA: Diagnosis not present

## 2015-11-22 DIAGNOSIS — Z8673 Personal history of transient ischemic attack (TIA), and cerebral infarction without residual deficits: Secondary | ICD-10-CM | POA: Diagnosis not present

## 2015-11-22 DIAGNOSIS — Z9049 Acquired absence of other specified parts of digestive tract: Secondary | ICD-10-CM | POA: Diagnosis not present

## 2015-11-22 DIAGNOSIS — R300 Dysuria: Secondary | ICD-10-CM | POA: Diagnosis not present

## 2015-11-22 DIAGNOSIS — R3911 Hesitancy of micturition: Secondary | ICD-10-CM | POA: Diagnosis not present

## 2015-11-22 DIAGNOSIS — D638 Anemia in other chronic diseases classified elsewhere: Secondary | ICD-10-CM | POA: Diagnosis not present

## 2015-11-26 DIAGNOSIS — F4323 Adjustment disorder with mixed anxiety and depressed mood: Secondary | ICD-10-CM | POA: Diagnosis not present

## 2015-12-05 DIAGNOSIS — D638 Anemia in other chronic diseases classified elsewhere: Secondary | ICD-10-CM | POA: Diagnosis not present

## 2015-12-05 DIAGNOSIS — C91Z0 Other lymphoid leukemia not having achieved remission: Secondary | ICD-10-CM | POA: Diagnosis not present

## 2015-12-05 DIAGNOSIS — L259 Unspecified contact dermatitis, unspecified cause: Secondary | ICD-10-CM | POA: Diagnosis not present

## 2015-12-19 DIAGNOSIS — D638 Anemia in other chronic diseases classified elsewhere: Secondary | ICD-10-CM | POA: Diagnosis not present

## 2015-12-23 DIAGNOSIS — C44622 Squamous cell carcinoma of skin of right upper limb, including shoulder: Secondary | ICD-10-CM | POA: Diagnosis not present

## 2016-01-01 DIAGNOSIS — R296 Repeated falls: Secondary | ICD-10-CM | POA: Diagnosis not present

## 2016-01-01 DIAGNOSIS — H542 Low vision, both eyes: Secondary | ICD-10-CM | POA: Diagnosis not present

## 2016-01-01 DIAGNOSIS — Z961 Presence of intraocular lens: Secondary | ICD-10-CM | POA: Diagnosis not present

## 2016-01-02 DIAGNOSIS — C91Z0 Other lymphoid leukemia not having achieved remission: Secondary | ICD-10-CM | POA: Diagnosis not present

## 2016-01-02 DIAGNOSIS — D638 Anemia in other chronic diseases classified elsewhere: Secondary | ICD-10-CM | POA: Diagnosis not present

## 2016-01-16 DIAGNOSIS — N390 Urinary tract infection, site not specified: Secondary | ICD-10-CM | POA: Diagnosis not present

## 2016-01-16 DIAGNOSIS — C91Z0 Other lymphoid leukemia not having achieved remission: Secondary | ICD-10-CM | POA: Diagnosis not present

## 2016-01-30 DIAGNOSIS — D638 Anemia in other chronic diseases classified elsewhere: Secondary | ICD-10-CM | POA: Diagnosis not present

## 2016-02-13 DIAGNOSIS — C91Z0 Other lymphoid leukemia not having achieved remission: Secondary | ICD-10-CM | POA: Diagnosis not present

## 2016-02-13 DIAGNOSIS — D638 Anemia in other chronic diseases classified elsewhere: Secondary | ICD-10-CM | POA: Diagnosis not present

## 2016-02-27 DIAGNOSIS — D638 Anemia in other chronic diseases classified elsewhere: Secondary | ICD-10-CM | POA: Diagnosis not present

## 2016-02-27 DIAGNOSIS — N189 Chronic kidney disease, unspecified: Secondary | ICD-10-CM | POA: Diagnosis not present

## 2016-02-27 DIAGNOSIS — C9480 Other specified leukemias not having achieved remission: Secondary | ICD-10-CM | POA: Diagnosis not present

## 2016-03-12 DIAGNOSIS — D638 Anemia in other chronic diseases classified elsewhere: Secondary | ICD-10-CM | POA: Diagnosis not present

## 2016-03-12 DIAGNOSIS — C91Z0 Other lymphoid leukemia not having achieved remission: Secondary | ICD-10-CM | POA: Diagnosis not present

## 2016-03-26 DIAGNOSIS — C91Z0 Other lymphoid leukemia not having achieved remission: Secondary | ICD-10-CM | POA: Diagnosis not present

## 2016-03-26 DIAGNOSIS — N189 Chronic kidney disease, unspecified: Secondary | ICD-10-CM | POA: Diagnosis not present

## 2016-03-26 DIAGNOSIS — D631 Anemia in chronic kidney disease: Secondary | ICD-10-CM | POA: Diagnosis not present

## 2016-04-07 DIAGNOSIS — E119 Type 2 diabetes mellitus without complications: Secondary | ICD-10-CM | POA: Diagnosis not present

## 2016-04-07 DIAGNOSIS — N390 Urinary tract infection, site not specified: Secondary | ICD-10-CM | POA: Diagnosis not present

## 2016-04-07 DIAGNOSIS — N3942 Incontinence without sensory awareness: Secondary | ICD-10-CM | POA: Diagnosis not present

## 2016-04-07 DIAGNOSIS — I10 Essential (primary) hypertension: Secondary | ICD-10-CM | POA: Diagnosis not present

## 2016-04-07 DIAGNOSIS — Z0001 Encounter for general adult medical examination with abnormal findings: Secondary | ICD-10-CM | POA: Diagnosis not present

## 2016-04-07 DIAGNOSIS — G4700 Insomnia, unspecified: Secondary | ICD-10-CM | POA: Diagnosis not present

## 2016-04-07 DIAGNOSIS — E782 Mixed hyperlipidemia: Secondary | ICD-10-CM | POA: Diagnosis not present

## 2016-04-07 DIAGNOSIS — M6281 Muscle weakness (generalized): Secondary | ICD-10-CM | POA: Diagnosis not present

## 2016-04-07 DIAGNOSIS — E039 Hypothyroidism, unspecified: Secondary | ICD-10-CM | POA: Diagnosis not present

## 2016-04-09 DIAGNOSIS — I340 Nonrheumatic mitral (valve) insufficiency: Secondary | ICD-10-CM | POA: Diagnosis not present

## 2016-04-09 DIAGNOSIS — D638 Anemia in other chronic diseases classified elsewhere: Secondary | ICD-10-CM | POA: Diagnosis not present

## 2016-04-24 DIAGNOSIS — D638 Anemia in other chronic diseases classified elsewhere: Secondary | ICD-10-CM | POA: Diagnosis not present

## 2016-04-24 DIAGNOSIS — C91Z0 Other lymphoid leukemia not having achieved remission: Secondary | ICD-10-CM | POA: Diagnosis not present

## 2016-04-28 ENCOUNTER — Inpatient Hospital Stay
Admission: EM | Admit: 2016-04-28 | Discharge: 2016-04-30 | DRG: 690 | Disposition: A | Discharge status: Home / Self Care | Payer: Medicare Other | Attending: Internal Medicine | Admitting: Internal Medicine

## 2016-04-28 ENCOUNTER — Encounter: Payer: Self-pay | Admitting: Emergency Medicine

## 2016-04-28 DIAGNOSIS — Z955 Presence of coronary angioplasty implant and graft: Secondary | ICD-10-CM

## 2016-04-28 DIAGNOSIS — Z856 Personal history of leukemia: Secondary | ICD-10-CM | POA: Diagnosis not present

## 2016-04-28 DIAGNOSIS — Z7982 Long term (current) use of aspirin: Secondary | ICD-10-CM | POA: Diagnosis not present

## 2016-04-28 DIAGNOSIS — Z806 Family history of leukemia: Secondary | ICD-10-CM | POA: Diagnosis not present

## 2016-04-28 DIAGNOSIS — E039 Hypothyroidism, unspecified: Secondary | ICD-10-CM | POA: Diagnosis present

## 2016-04-28 DIAGNOSIS — I2510 Atherosclerotic heart disease of native coronary artery without angina pectoris: Secondary | ICD-10-CM | POA: Diagnosis not present

## 2016-04-28 DIAGNOSIS — E876 Hypokalemia: Secondary | ICD-10-CM | POA: Diagnosis present

## 2016-04-28 DIAGNOSIS — I10 Essential (primary) hypertension: Secondary | ICD-10-CM | POA: Diagnosis present

## 2016-04-28 DIAGNOSIS — N390 Urinary tract infection, site not specified: Secondary | ICD-10-CM

## 2016-04-28 DIAGNOSIS — Z96651 Presence of right artificial knee joint: Secondary | ICD-10-CM | POA: Diagnosis present

## 2016-04-28 DIAGNOSIS — Z79899 Other long term (current) drug therapy: Secondary | ICD-10-CM | POA: Diagnosis not present

## 2016-04-28 DIAGNOSIS — Z880 Allergy status to penicillin: Secondary | ICD-10-CM | POA: Diagnosis not present

## 2016-04-28 DIAGNOSIS — Z8673 Personal history of transient ischemic attack (TIA), and cerebral infarction without residual deficits: Secondary | ICD-10-CM

## 2016-04-28 DIAGNOSIS — N3000 Acute cystitis without hematuria: Secondary | ICD-10-CM | POA: Diagnosis not present

## 2016-04-28 DIAGNOSIS — M069 Rheumatoid arthritis, unspecified: Secondary | ICD-10-CM | POA: Diagnosis present

## 2016-04-28 DIAGNOSIS — D569 Thalassemia, unspecified: Secondary | ICD-10-CM | POA: Diagnosis present

## 2016-04-28 DIAGNOSIS — Z1624 Resistance to multiple antibiotics: Secondary | ICD-10-CM | POA: Diagnosis present

## 2016-04-28 DIAGNOSIS — C9590 Leukemia, unspecified not having achieved remission: Secondary | ICD-10-CM | POA: Diagnosis not present

## 2016-04-28 HISTORY — DX: Thalassemia, unspecified: D56.9

## 2016-04-28 HISTORY — DX: Leukemia, unspecified not having achieved remission: C95.90

## 2016-04-28 LAB — CBC WITH DIFFERENTIAL/PLATELET
Basophils Absolute: 0 10*3/uL (ref 0–0.1)
Basophils Relative: 1 %
Eosinophils Absolute: 0.1 10*3/uL (ref 0–0.7)
Eosinophils Relative: 1 %
HCT: 33.2 % — ABNORMAL LOW (ref 35.0–47.0)
Hemoglobin: 10.5 g/dL — ABNORMAL LOW (ref 12.0–16.0)
Lymphocytes Relative: 51 %
Lymphs Abs: 3.4 10*3/uL (ref 1.0–3.6)
MCH: 19.3 pg — ABNORMAL LOW (ref 26.0–34.0)
MCHC: 31.8 g/dL — ABNORMAL LOW (ref 32.0–36.0)
MCV: 60.9 fL — ABNORMAL LOW (ref 80.0–100.0)
Monocytes Absolute: 0.9 10*3/uL (ref 0.2–0.9)
Monocytes Relative: 13 %
Neutro Abs: 2.3 10*3/uL (ref 1.4–6.5)
Neutrophils Relative %: 34 %
Platelets: 99 10*3/uL — ABNORMAL LOW (ref 150–440)
RBC: 5.44 MIL/uL — ABNORMAL HIGH (ref 3.80–5.20)
RDW: 17.5 % — ABNORMAL HIGH (ref 11.5–14.5)
WBC: 6.6 10*3/uL (ref 3.6–11.0)

## 2016-04-28 LAB — URINALYSIS COMPLETE WITH MICROSCOPIC (ARMC ONLY)
Bilirubin Urine: NEGATIVE
Glucose, UA: NEGATIVE mg/dL
Ketones, ur: NEGATIVE mg/dL
Nitrite: NEGATIVE
Protein, ur: 30 mg/dL — AB
Specific Gravity, Urine: 1.012 (ref 1.005–1.030)
pH: 5 (ref 5.0–8.0)

## 2016-04-28 LAB — COMPREHENSIVE METABOLIC PANEL
ALT: 15 U/L (ref 14–54)
AST: 26 U/L (ref 15–41)
Albumin: 3.9 g/dL (ref 3.5–5.0)
Alkaline Phosphatase: 29 U/L — ABNORMAL LOW (ref 38–126)
Anion gap: 8 (ref 5–15)
BUN: 16 mg/dL (ref 6–20)
CO2: 26 mmol/L (ref 22–32)
Calcium: 9.3 mg/dL (ref 8.9–10.3)
Chloride: 106 mmol/L (ref 101–111)
Creatinine, Ser: 1.24 mg/dL — ABNORMAL HIGH (ref 0.44–1.00)
GFR calc Af Amer: 45 mL/min — ABNORMAL LOW (ref >60)
GFR calc non Af Amer: 39 mL/min — ABNORMAL LOW (ref >60)
Glucose, Bld: 108 mg/dL — ABNORMAL HIGH (ref 65–99)
Potassium: 2.9 mmol/L — ABNORMAL LOW (ref 3.5–5.1)
Sodium: 140 mmol/L (ref 135–145)
Total Bilirubin: 1.1 mg/dL (ref 0.3–1.2)
Total Protein: 7.3 g/dL (ref 6.5–8.1)

## 2016-04-28 LAB — LIPASE, BLOOD: Lipase: 16 U/L (ref 11–51)

## 2016-04-28 LAB — POTASSIUM: Potassium: 3.3 mmol/L — ABNORMAL LOW (ref 3.5–5.1)

## 2016-04-28 MED ORDER — POTASSIUM CHLORIDE IN NACL 20-0.9 MEQ/L-% IV SOLN
INTRAVENOUS | Status: AC
  Administered 2016-04-28 – 2016-04-29 (×2): via INTRAVENOUS
  Filled 2016-04-28 (×2): qty 1000

## 2016-04-28 MED ORDER — ASPIRIN EC 325 MG PO TBEC
325.0000 mg | DELAYED_RELEASE_TABLET | Freq: Every day | ORAL | Status: DC
Start: 2016-04-28 — End: 2016-04-30
  Administered 2016-04-29 – 2016-04-30 (×2): 325 mg via ORAL
  Filled 2016-04-28 (×2): qty 1

## 2016-04-28 MED ORDER — ENOXAPARIN SODIUM 30 MG/0.3ML ~~LOC~~ SOLN
30.0000 mg | SUBCUTANEOUS | Status: DC
  Administered 2016-04-28 – 2016-04-29 (×2): 30 mg via SUBCUTANEOUS
  Filled 2016-04-28 (×2): qty 0.3

## 2016-04-28 MED ORDER — LEVOTHYROXINE SODIUM 50 MCG PO TABS
50.0000 ug | ORAL_TABLET | ORAL | Status: DC
  Administered 2016-04-29: 50 ug via ORAL
  Filled 2016-04-28 (×2): qty 1

## 2016-04-28 MED ORDER — VITAMIN C 500 MG PO TABS
1000.0000 mg | ORAL_TABLET | Freq: Every day | ORAL | Status: DC
  Administered 2016-04-29 – 2016-04-30 (×2): 1000 mg via ORAL
  Filled 2016-04-28 (×2): qty 2

## 2016-04-28 MED ORDER — POTASSIUM CHLORIDE 20 MEQ PO PACK
PACK | ORAL | Status: AC
  Filled 2016-04-28: qty 2

## 2016-04-28 MED ORDER — CARVEDILOL 3.125 MG PO TABS
6.2500 mg | ORAL_TABLET | Freq: Two times a day (BID) | ORAL | Status: DC
  Administered 2016-04-28 – 2016-04-30 (×4): 6.25 mg via ORAL
  Filled 2016-04-28 (×4): qty 2

## 2016-04-28 MED ORDER — SODIUM CHLORIDE 0.9 % IV SOLN
1000.0000 mL | Freq: Once | INTRAVENOUS | Status: AC
  Administered 2016-04-28: 1000 mL via INTRAVENOUS

## 2016-04-28 MED ORDER — ACETAMINOPHEN 650 MG RE SUPP: 650.0000 mg | Freq: Four times a day (QID) | RECTAL | Status: DC | PRN

## 2016-04-28 MED ORDER — ACETAMINOPHEN 325 MG PO TABS
650.0000 mg | ORAL_TABLET | Freq: Four times a day (QID) | ORAL | Status: DC | PRN
  Administered 2016-04-28 – 2016-04-29 (×2): 650 mg via ORAL
  Filled 2016-04-28 (×2): qty 2

## 2016-04-28 MED ORDER — GLIMEPIRIDE 2 MG PO TABS
1.0000 mg | ORAL_TABLET | Freq: Every day | ORAL | Status: DC
  Administered 2016-04-29: 1 mg via ORAL
  Filled 2016-04-28: qty 1

## 2016-04-28 MED ORDER — ONDANSETRON 4 MG PO TBDP: 8.0000 mg | ORAL_TABLET | Freq: Three times a day (TID) | ORAL | Status: DC | PRN

## 2016-04-28 MED ORDER — VITAMIN E 45 MG (100 UNIT) PO CAPS
400.0000 [IU] | ORAL_CAPSULE | Freq: Every day | ORAL | Status: DC
  Administered 2016-04-29 – 2016-04-30 (×2): 400 [IU] via ORAL
  Filled 2016-04-28 (×2): qty 4

## 2016-04-28 MED ORDER — LEVOTHYROXINE SODIUM 75 MCG PO TABS
75.0000 ug | ORAL_TABLET | ORAL | Status: DC
  Administered 2016-04-30: 75 ug via ORAL
  Filled 2016-04-28 (×2): qty 1

## 2016-04-28 MED ORDER — SENNOSIDES-DOCUSATE SODIUM 8.6-50 MG PO TABS: 1.0000 | ORAL_TABLET | Freq: Every evening | ORAL | Status: DC | PRN

## 2016-04-28 MED ORDER — ONDANSETRON HCL 4 MG/2ML IJ SOLN
4.0000 mg | Freq: Four times a day (QID) | INTRAMUSCULAR | Status: DC | PRN
  Administered 2016-04-29: 4 mg via INTRAVENOUS
  Filled 2016-04-28: qty 2

## 2016-04-28 MED ORDER — ASPIRIN 81 MG PO CHEW: 81.0000 mg | CHEWABLE_TABLET | Freq: Every day | ORAL | Status: DC

## 2016-04-28 MED ORDER — POTASSIUM CHLORIDE 20 MEQ PO PACK
40.0000 meq | PACK | Freq: Once | ORAL | Status: AC
  Administered 2016-04-28: 40 meq via ORAL

## 2016-04-28 MED ORDER — ONDANSETRON HCL 4 MG PO TABS: 4.0000 mg | ORAL_TABLET | Freq: Four times a day (QID) | ORAL | Status: DC | PRN

## 2016-04-28 MED ORDER — CEFTRIAXONE SODIUM 1 G IJ SOLR
1.0000 g | Freq: Once | INTRAMUSCULAR | Status: AC
  Administered 2016-04-28: 1 g via INTRAVENOUS
  Filled 2016-04-28: qty 10

## 2016-04-28 MED ORDER — POTASSIUM CHLORIDE 20 MEQ PO PACK
40.0000 meq | PACK | Freq: Once | ORAL | Status: AC
  Administered 2016-04-28: 40 meq via ORAL
  Filled 2016-04-28: qty 2

## 2016-04-28 MED ORDER — LEVOTHYROXINE SODIUM 75 MCG PO TABS: 75.0000 ug | ORAL_TABLET | ORAL | Status: DC

## 2016-04-28 MED ORDER — VITAMIN D 1000 UNITS PO TABS
1000.0000 [IU] | ORAL_TABLET | Freq: Every day | ORAL | Status: DC
  Administered 2016-04-29 – 2016-04-30 (×2): 1000 [IU] via ORAL
  Filled 2016-04-28 (×2): qty 1

## 2016-04-28 MED ORDER — SACCHAROMYCES BOULARDII 250 MG PO CAPS
250.0000 mg | ORAL_CAPSULE | Freq: Two times a day (BID) | ORAL | Status: DC
  Administered 2016-04-28 – 2016-04-30 (×4): 250 mg via ORAL
  Filled 2016-04-28 (×4): qty 1

## 2016-04-28 MED ORDER — SODIUM CHLORIDE 0.9 % IV SOLN
500.0000 mg | Freq: Two times a day (BID) | INTRAVENOUS | Status: DC
  Administered 2016-04-28 – 2016-04-29 (×3): 500 mg via INTRAVENOUS
  Filled 2016-04-28 (×7): qty 0.5

## 2016-04-28 MED ORDER — FOLIC ACID 1 MG PO TABS
1.0000 mg | ORAL_TABLET | Freq: Every day | ORAL | Status: DC
  Administered 2016-04-29 – 2016-04-30 (×2): 1 mg via ORAL
  Filled 2016-04-28 (×2): qty 1

## 2016-04-28 NOTE — ED Provider Notes (Signed)
[facility] Emergency Department Provider Note   ____________________________________________    I have reviewed the triage vital signs and the nursing notes.   HISTORY  Chief Complaint Recurrent UTI   HPI Stephanie Harris is a 82 y.o. female who presents with severe dysuria. Patient reports a history of drug-resistant urinary tract infections. She has been on Macrobid for the last 2 weeks. She has not improved. Her PCP sent her to the emergency department to be admitted for IV antibiotics. She has required this in the past as well. She denies fevers or chills. She has had some nausea.    Past Medical History:  Diagnosis Date  . CML (chronic myelocytic leukemia) ([place])    Onc at [facility]  . Heart disease   . Hypertension   . Leukemia ([place])   . RA (rheumatoid arthritis) ([place])   . Stroke [facility])     Patient Active Problem List   Diagnosis Date Noted  . Chest pain 03/14/2015  . TIA (transient ischemic attack) 01/29/2015  . Allergic arthritis, hand     Past Surgical History:  Procedure Laterality Date  . ABDOMINAL HYSTERECTOMY    . CAROTID ENDARTERECTOMY Left   . CATARACT EXTRACTION    . CHOLECYSTECTOMY    . PERCUTANEOUS CORONARY STENT INTERVENTION (PCI-S)    . ROTATOR CUFF REPAIR Left   . TOTAL KNEE ARTHROPLASTY Right     Prior to Admission medications   Medication Sig Start Date End Date Taking? Authorizing Provider  amLODipine (NORVASC) 5 MG tablet Take 1 tablet by mouth daily.    Historical Provider, MD  ascorbic acid (VITAMIN C) 1000 MG tablet Take 1,000 mg by mouth daily.    Historical Provider, MD  aspirin EC 325 MG EC tablet Take 1 tablet (325 mg total) by mouth daily. 01/30/15   [name], MD  carvedilol (COREG) 3.125 MG tablet Take 6.25 mg by mouth 2 (two) times daily.     Historical Provider, MD  cholecalciferol (VITAMIN D) 1000 UNITS tablet Take 1,000 Units by mouth daily.    Historical Provider, MD  folic acid (FOLVITE) 1 MG tablet  Take 1 mg by mouth daily.    Historical Provider, MD  glimepiride (AMARYL) 1 MG tablet Take 1 tablet by mouth daily with supper.    Historical Provider, MD  levothyroxine (SYNTHROID, LEVOTHROID) 50 MCG tablet Take 50 mcg by mouth every other day. On the opposite days from taking 20mcg.    Historical Provider, MD  levothyroxine (SYNTHROID, LEVOTHROID) 75 MCG tablet Take 75 mcg by mouth every other day. On the opposite days from taking 44mcg    Historical Provider, MD  ondansetron (ZOFRAN ODT) 8 MG disintegrating tablet Take 1 tablet (8 mg total) by mouth every 8 (eight) hours as needed for nausea or vomiting. 09/30/15   [name], MD  saccharomyces boulardii (FLORASTOR) 250 MG capsule Take 1 capsule (250 mg total) by mouth 2 (two) times daily. Patient taking differently: Take 250 mg by mouth as needed.  01/30/15   [name], MD  vitamin E 400 UNIT capsule Take 400 Units by mouth daily.    Historical Provider, MD     Allergies Penicillins  Family History  Problem Relation Age of Onset  . Acute myelogenous leukemia Brother     Social History Social History  Substance Use Topics  . Smoking status: Never Smoker  . Smokeless tobacco: Not on file  . Alcohol use No    Review of Systems  Constitutional: No fever/chills Eyes: No visual changes.  ENT: No sore throat. Cardiovascular: Denies chest pain. Respiratory: Denies Cough Gastrointestinal: Lower abdominal cramping Genitourinary: As above Musculoskeletal: Negative for back pain. Skin: Negative for rash. Neurological: Negative for headaches or weakness  10-point ROS otherwise negative.  ____________________________________________   PHYSICAL EXAM:  VITAL SIGNS: ED Triage Vitals [04/28/16 1159]  Enc Vitals Group     BP 124/61     Pulse Rate 78     Resp 16     Temp 97.8 F (36.6 C)     Temp Source Oral     SpO2 (!) 10 %     Weight 120 lb (54.4 kg)     Height 5' (1.524 m)     Head Circumference      Peak Flow       Pain Score 0     Pain Loc      Pain Edu?      Excl. in [place]?     Constitutional: Alert and oriented. No acute distress. Pleasant and interactive Eyes: Conjunctivae are normal.  Head: Atraumatic. Nose: No congestion/rhinnorhea. Mouth/Throat: Mucous membranes are moist.   Neck:  Painless ROM Cardiovascular: Normal rate, regular rhythm. Grossly normal heart sounds.  Good peripheral circulation. Respiratory: Normal respiratory effort.  No retractions. Lungs CTAB. Gastrointestinal: Mild tenderness superpubicly. No distention.  No CVA tenderness. Genitourinary: deferred Musculoskeletal: No lower extremity tenderness. Warm and well perfused Neurologic:  Normal speech and language. No gross focal neurologic deficits are appreciated.  Skin:  Skin is warm, dry and intact. No rash noted. Psychiatric: Mood and affect are normal. Speech and behavior are normal.  ____________________________________________   LABS (all labs ordered are listed, but only abnormal results are displayed)  Labs Reviewed  CBC WITH DIFFERENTIAL/PLATELET - Abnormal; Notable for the following:       Result Value   RBC 5.44 (*)    Hemoglobin 10.5 (*)    HCT 33.2 (*)    MCV 60.9 (*)    MCH 19.3 (*)    MCHC 31.8 (*)    RDW 17.5 (*)    Platelets 99 (*)    All other components within normal limits  COMPREHENSIVE METABOLIC PANEL - Abnormal; Notable for the following:    Potassium 2.9 (*)    Glucose, Bld 108 (*)    Creatinine, Ser 1.24 (*)    Alkaline Phosphatase 29 (*)    GFR calc non Af Amer 39 (*)    GFR calc Af Amer 45 (*)    All other components within normal limits  URINALYSIS COMPLETEWITH MICROSCOPIC ([facility] ONLY) - Abnormal; Notable for the following:    Color, Urine YELLOW (*)    APPearance CLOUDY (*)    Hgb urine dipstick 1+ (*)    Protein, ur 30 (*)    Leukocytes, UA 3+ (*)    Bacteria, UA RARE (*)    Squamous Epithelial / LPF 0-5 (*)    All other components within normal limits  URINE CULTURE    LIPASE, BLOOD   ____________________________________________  EKG  None ____________________________________________  RADIOLOGY  None ____________________________________________   PROCEDURES  Procedure(s) performed: No    Critical Care performed: No ____________________________________________   INITIAL IMPRESSION / ASSESSMENT AND PLAN / ED COURSE  Pertinent labs & imaging results that were available during my care of the patient were reviewed by me and considered in my medical decision making (see chart for details).  Patient presents with dysuria and suprapubic discomfort, she has a history  of resistant UTIs. We will start her on IV Rocephin which she has tolerated in the past, send urine culture and admitted to the hospital  Clinical Course   ____________________________________________   FINAL CLINICAL IMPRESSION(S) / ED DIAGNOSES  Final diagnoses:  UTI (lower urinary tract infection)      NEW MEDICATIONS STARTED DURING THIS VISIT:  New Prescriptions   No medications on file     Note:  This document was prepared using Dragon voice recognition software and may include unintentional dictation errors.    Lavonia Drafts, MD 04/28/16 [phone]

## 2016-04-28 NOTE — ED Triage Notes (Signed)
Pt reports she has been on antibiotics for UTI since 8/17. Pt has leukemia. Pt reports she's scheduled for bladder procedure tomorrow but PCP told her to come here and evaluated for UTI again.

## 2016-04-28 NOTE — H&P (Signed)
[name] NAME: Stephanie Harris    MR#:  [id number]  DATE OF BIRTH:  05-22-1931  DATE OF ADMISSION:  04/28/2016  PRIMARY CARE PHYSICIAN: [name], MD   REQUESTING/REFERRING PHYSICIAN: [name]  CHIEF COMPLAINT:  Dysuria  HISTORY OF PRESENT ILLNESS:  Stephanie Harris  is a 82 y.o. female with a known history of CML, essential hypertension, large granulocyte lymphocytic disorder, sees [name] not on any chemotherapy or radiation therapy, multiple drug resistant UTIs is presenting to the ED with a chief complaint of severe dysuria. Patient has been taking Macrobid for the past 2 weeks no improvement. Reporting lower abdominal pain. Denies any fever or chills. Follows up with [facility] regarding his recurrent UTIs. Reporting nausea but denies any vomiting. Urine is abnormal in the ED  PAST MEDICAL HISTORY:   Past Medical History:  Diagnosis Date  . CML (chronic myelocytic leukemia) ([place])    [facility]c at [facility]  . Heart disease   . Hypertension   . Leukemia ([place])   . RA (rheumatoid arthritis) ([place])   . Stroke [facility])     PAST SURGICAL HISTOIRY:   Past Surgical History:  Procedure Laterality Date  . ABDOMINAL HYSTERECTOMY    . CAROTID ENDARTERECTOMY Left   . CATARACT EXTRACTION    . CHOLECYSTECTOMY    . PERCUTANEOUS CORONARY STENT INTERVENTION (PCI-S)    . ROTATOR CUFF REPAIR Left   . TOTAL KNEE ARTHROPLASTY Right     SOCIAL HISTORY:   Social History  Substance Use Topics  . Smoking status: Never Smoker  . Smokeless tobacco: Not on file  . Alcohol use No    FAMILY HISTORY:   Family History  Problem Relation Age of Onset  . Acute myelogenous leukemia Brother     DRUG ALLERGIES:   Allergies  Allergen Reactions  . Penicillins Other (See Comments)    Reaction: pt unsure. Chart had Hives listed.    REVIEW OF SYSTEMS:  CONSTITUTIONAL: No fever, fatigue or weakness.  EYES: No blurred or double vision.   EARS, NOSE, AND THROAT: No tinnitus or ear pain.  RESPIRATORY: No cough, shortness of breath, wheezing or hemoptysis.  CARDIOVASCULAR: No chest pain, orthopnea, edema.  GASTROINTESTINAL: Reporting some nausea, denies vomiting, diarrhea . Reporting lower abdominal pain.  GENITOURINARY: Has dysuria, no hematuria.  ENDOCRINE: No polyuria, nocturia,  HEMATOLOGY: No anemia, easy bruising or bleeding SKIN: No rash or lesion. MUSCULOSKELETAL: No joint pain or arthritis.   NEUROLOGIC: No tingling, numbness, weakness.  PSYCHIATRY: No anxiety or depression.   MEDICATIONS AT HOME:   Prior to Admission medications   Medication Sig Start Date End Date Taking? Authorizing Provider  ascorbic acid (VITAMIN C) 1000 MG tablet Take 1,000 mg by mouth daily.   Yes Historical Provider, MD  aspirin 81 MG chewable tablet Chew by mouth daily.   Yes Historical Provider, MD  carvedilol (COREG) 3.125 MG tablet Take 6.25 mg by mouth 2 (two) times daily.    Yes Historical Provider, MD  cholecalciferol (VITAMIN D) 1000 UNITS tablet Take 1,000 Units by mouth daily.   Yes Historical Provider, MD  folic acid (FOLVITE) 1 MG tablet Take 1 mg by mouth daily.   Yes Historical Provider, MD  levothyroxine (SYNTHROID, LEVOTHROID) 50 MCG tablet Take 50 mcg by mouth every other day. On the opposite days from taking 46mcg.   Yes Historical Provider, MD  vitamin E 400 UNIT capsule Take 400 Units by mouth daily.  Yes Historical Provider, MD  amLODipine (NORVASC) 5 MG tablet Take 1 tablet by mouth daily.    Historical Provider, MD  aspirin EC 325 MG EC tablet Take 1 tablet (325 mg total) by mouth daily. Patient not taking: Reported on 04/28/2016 01/30/15   Stephanie Mango, MD  glimepiride (AMARYL) 1 MG tablet Take 1 tablet by mouth daily with supper.    Historical Provider, MD  levothyroxine (SYNTHROID, LEVOTHROID) 75 MCG tablet Take 75 mcg by mouth every other day. On the opposite days from taking 51mcg    Historical Provider, MD   ondansetron (ZOFRAN ODT) 8 MG disintegrating tablet Take 1 tablet (8 mg total) by mouth every 8 (eight) hours as needed for nausea or vomiting. Patient not taking: Reported on 04/28/2016 09/30/15   [name], MD  saccharomyces boulardii (FLORASTOR) 250 MG capsule Take 1 capsule (250 mg total) by mouth 2 (two) times daily. Patient not taking: Reported on 04/28/2016 01/30/15   Stephanie Mango, MD      VITAL SIGNS:  Blood pressure 120/62, pulse 74, temperature 97.8 F (36.6 C), temperature source Oral, resp. rate 16, height 5' (1.524 m), weight 54.4 kg (120 lb), SpO2 100 %.  PHYSICAL EXAMINATION:  GENERAL:  82 y.o.-year-old patient lying in the bed with no acute distress.  EYES: Pupils equal, round, reactive to light and accommodation. No scleral icterus. Extraocular muscles intact.  HEENT: Head atraumatic, normocephalic. Oropharynx and nasopharynx clear.  NECK:  Supple, no jugular venous distention. No thyroid enlargement, no tenderness.  LUNGS: Normal breath sounds bilaterally, no wheezing, rales,rhonchi or crepitation. No use of accessory muscles of respiration.  CARDIOVASCULAR: S1, S2 normal. No murmurs, rubs, or gallops.  ABDOMEN: Soft, lower abd tenderness,   no rebound tenderness, nondistended. Bowel sounds present. No organomegaly or mass.  EXTREMITIES: No pedal edema, cyanosis, or clubbing.  NEUROLOGIC: Cranial nerves II through XII are intact. Muscle strength 5/5 in all extremities. Sensation intact. Gait not checked.  PSYCHIATRIC: The patient is alert and oriented x 3.  SKIN: No obvious rash, lesion, or ulcer.   LABORATORY PANEL:   CBC  Recent Labs Lab 04/28/16 1334  WBC 6.6  HGB 10.5*  HCT 33.2*  PLT 99*   ------------------------------------------------------------------------------------------------------------------  Chemistries   Recent Labs Lab 04/28/16 1334  NA 140  K 2.9*  CL 106  CO2 26  GLUCOSE 108*  BUN 16  CREATININE 1.24*  CALCIUM 9.3  AST 26  ALT  15  ALKPHOS 29*  BILITOT 1.1   ------------------------------------------------------------------------------------------------------------------  Cardiac Enzymes No results for input(s): TROPONINI in the last 168 hours. ------------------------------------------------------------------------------------------------------------------  RADIOLOGY:  No results found.  EKG:   Orders placed or performed during the hospital encounter of 09/30/15  . ED EKG within 10 minutes  . ED EKG within 10 minutes  . EKG 12-Lead  . EKG 12-Lead  . EKG    IMPRESSION AND PLAN:    Stephanie Harris  is a 82 y.o. female with a known history of CML, essential hypertension, large granulocyte lymphocytic disorder, sees [name] not on any chemotherapy or radiation therapy, multiple drug resistant UTIs is presenting to the ED with a chief complaint of severe dysuria. Patient has been taking Macrobid for the past 2 weeks no improvement. Reporting lower abdominal pain.  # Severe dysuria with recurrent multidrug resistant UTIs Admit her to MedSurg unit Urine culture and sensitivities ordered Start the patient on meropenem IV Recommendation patient to follow-up with [facility] as an outpatient as scheduled Will consider ID consult  depending on the culture results  #hypokalemia Replete potassium and check potassium and magnesium levels  #History of coronary artery disease. Currently patient is asymptomatic Resume her home medications Coreg and aspirin  #Hypothyroidism continue Synthroid  alternate 50 and 75 g every other day  All the records are reviewed and case discussed with ED provider. Management plans discussed with the patient, family and they are in agreement.  CODE STATUS: FC/Husband  TOTAL TIME TAKING CARE OF THIS PATIENT: 45 minutes.   Note: This dictation was prepared with Dragon dictation along with smaller phrase technology. Any transcriptional errors that result from this process  are unintentional.  Stephanie Harris M.D on 04/28/2016 at 5:12 PM  Between 7am to 6pm - Pager - [phone]  After 6pm go to [email or web address] - password [email or web address] [place] [facility]  Office  [phone]  CC: Primary care physician; [name], MD

## 2016-04-29 LAB — CBC
HCT: 27.8 % — ABNORMAL LOW (ref 35.0–47.0)
Hemoglobin: 8.9 g/dL — ABNORMAL LOW (ref 12.0–16.0)
MCH: 19.4 pg — ABNORMAL LOW (ref 26.0–34.0)
MCHC: 32.2 g/dL (ref 32.0–36.0)
MCV: 60.1 fL — ABNORMAL LOW (ref 80.0–100.0)
Platelets: 76 10*3/uL — ABNORMAL LOW (ref 150–440)
RBC: 4.62 MIL/uL (ref 3.80–5.20)
RDW: 17.6 % — ABNORMAL HIGH (ref 11.5–14.5)
WBC: 5.5 10*3/uL (ref 3.6–11.0)

## 2016-04-29 LAB — MAGNESIUM: Magnesium: 1.6 mg/dL — ABNORMAL LOW (ref 1.7–2.4)

## 2016-04-29 LAB — COMPREHENSIVE METABOLIC PANEL
ALT: 12 U/L — ABNORMAL LOW (ref 14–54)
AST: 20 U/L (ref 15–41)
Albumin: 3.1 g/dL — ABNORMAL LOW (ref 3.5–5.0)
Alkaline Phosphatase: 25 U/L — ABNORMAL LOW (ref 38–126)
Anion gap: 4 — ABNORMAL LOW (ref 5–15)
BUN: 15 mg/dL (ref 6–20)
CO2: 24 mmol/L (ref 22–32)
Calcium: 8.6 mg/dL — ABNORMAL LOW (ref 8.9–10.3)
Chloride: 112 mmol/L — ABNORMAL HIGH (ref 101–111)
Creatinine, Ser: 1.19 mg/dL — ABNORMAL HIGH (ref 0.44–1.00)
GFR calc Af Amer: 48 mL/min — ABNORMAL LOW (ref >60)
GFR calc non Af Amer: 41 mL/min — ABNORMAL LOW (ref >60)
Glucose, Bld: 111 mg/dL — ABNORMAL HIGH (ref 65–99)
Potassium: 4.4 mmol/L (ref 3.5–5.1)
Sodium: 140 mmol/L (ref 135–145)
Total Bilirubin: 1 mg/dL (ref 0.3–1.2)
Total Protein: 5.9 g/dL — ABNORMAL LOW (ref 6.5–8.1)

## 2016-04-29 LAB — URINE CULTURE: Culture: NO GROWTH

## 2016-04-29 MED ORDER — MAGNESIUM SULFATE 2 GM/50ML IV SOLN
2.0000 g | Freq: Once | INTRAVENOUS | Status: AC
  Administered 2016-04-29: 2 g via INTRAVENOUS
  Filled 2016-04-29: qty 50

## 2016-04-29 MED ORDER — POLYETHYLENE GLYCOL 3350 17 G PO PACK
17.0000 g | PACK | Freq: Once | ORAL | Status: AC
  Administered 2016-04-29: 17 g via ORAL
  Filled 2016-04-29: qty 1

## 2016-04-29 MED ORDER — MAGNESIUM SULFATE 2 GM/50ML IV SOLN
2.0000 g | Freq: Once | INTRAVENOUS | Status: AC
  Administered 2016-04-30: 2 g via INTRAVENOUS
  Filled 2016-04-29 (×2): qty 50

## 2016-04-29 NOTE — Progress Notes (Signed)
Initial Nutrition Assessment  DOCUMENTATION CODES:   Not applicable  INTERVENTION:  -Pt declined nutritional supplement such as Ensure but was agreeable to snacks between meals, orders placed  NUTRITION DIAGNOSIS:   Inadequate oral intake related to chronic illness as evidenced by per patient/family report, estimated needs.  GOAL:   Patient will meet greater than or equal to 90% of their needs  MONITOR:   PO intake, Labs, Weight trends  REASON FOR ASSESSMENT:   Malnutrition Screening Tool    ASSESSMENT:    82 yo female admitted with severe dysuria with multi-drug resistant UTI. Pt with hx of CML  Recorded po intake 100% at breakfast this AM. Pt reports she typically eats 2 meals per day, does not uses nutritional supplements. Appetite is fair  Pt reports she weighed 175 pounds about 4-5 years ago with dx of CML; current wt of 120 pounds. Pt reports a few pound wt loss over the past few months.   Nutrition-Focused physical exam completed. Findings are WDL for fat depletion, muscle depletion, and edema.    Past Medical History:  Diagnosis Date  . CML (chronic myelocytic leukemia) ([place])    Onc at [facility]  . Heart disease   . Hypertension   . Leukemia ([place])   . RA (rheumatoid arthritis) ([place])   . Stroke ([place])   . Thalassemia      Diet Order:  Diet Heart Room service appropriate? Yes; Fluid consistency: Thin  Skin:  Reviewed, no issues  Last BM:  9/3   Labs: reviewed  Meds: florastor, potassium chloride, ascorbic acid  Height:   Ht Readings from Last 1 Encounters:  04/28/16 5' (1.524 m)    Weight:   Wt Readings from Last 1 Encounters:  04/28/16 120 lb (54.4 kg)    Wt Readings from Last 10 Encounters:  04/28/16 120 lb (54.4 kg)  10/30/15 128 lb 14.4 oz (58.5 kg)  09/30/15 123 lb (55.8 kg)  03/14/15 140 lb 14.4 oz (63.9 kg)  01/30/15 131 lb 4.8 oz (59.6 kg)    BMI:  Body mass index is 23.44 kg/m.  Estimated Nutritional Needs:   Kcal:   1500-1700 kcals   Protein:  65-80 g  Fluid:  >/= 1.5 L  EDUCATION NEEDS:   No education needs identified at this time  [place], [place], [place] [phone] Pager  [phone] Weekend/On-Call Pager

## 2016-04-29 NOTE — Progress Notes (Signed)
[place] [facility] [place] NAME: Stephanie Harris    MR#:  [id number]  DATE OF BIRTH:  Jan 29, 1931  SUBJECTIVE:  C/o dysuria and lower abdominal pain  REVIEW OF SYSTEMS:   Review of Systems  Constitutional: Positive for malaise/fatigue. Negative for chills, fever and weight loss.  HENT: Negative for ear discharge, ear pain and nosebleeds.   Eyes: Negative for blurred vision, pain and discharge.  Respiratory: Negative for sputum production, shortness of breath, wheezing and stridor.   Cardiovascular: Negative for chest pain, palpitations, orthopnea and PND.  Gastrointestinal: Negative for abdominal pain, diarrhea, nausea and vomiting.  Genitourinary: Positive for dysuria. Negative for frequency and urgency.  Musculoskeletal: Negative for back pain and joint pain.  Neurological: Positive for weakness. Negative for sensory change, speech change and focal weakness.  Psychiatric/Behavioral: Negative for depression and hallucinations. The patient is not nervous/anxious.    Tolerating Diet:yes Tolerating PT: pending  DRUG ALLERGIES:   Allergies  Allergen Reactions  . Penicillins Other (See Comments)    Reaction: pt unsure. Chart had Hives listed.    VITALS:  Blood pressure (!) 144/56, pulse 71, temperature 98.3 F (36.8 C), temperature source Oral, resp. rate 18, height 5' (1.524 m), weight 54.4 kg (120 lb), SpO2 98 %.  PHYSICAL EXAMINATION:   Physical Exam  GENERAL:  82 y.o.-year-old patient lying in the bed with no acute distress.  EYES: Pupils equal, round, reactive to light and accommodation. No scleral icterus. Extraocular muscles intact.  HEENT: Head atraumatic, normocephalic. Oropharynx and nasopharynx clear.  NECK:  Supple, no jugular venous distention. No thyroid enlargement, no tenderness.  LUNGS: Normal breath sounds bilaterally, no wheezing, rales, rhonchi. No use of accessory muscles of respiration.  CARDIOVASCULAR: S1, S2  normal. No murmurs, rubs, or gallops.  ABDOMEN: Soft, nontender, nondistended. Bowel sounds present. No organomegaly or mass.  EXTREMITIES: No cyanosis, clubbing or edema b/l.    NEUROLOGIC: Cranial nerves II through XII are intact. No focal Motor or sensory deficits b/l.   PSYCHIATRIC:  patient is alert and oriented x 3.  SKIN: No obvious rash, lesion, or ulcer.   LABORATORY PANEL:  CBC  Recent Labs Lab 04/29/16 0429  WBC 5.5  HGB 8.9*  HCT 27.8*  PLT 76*    Chemistries   Recent Labs Lab 04/29/16 0429  NA 140  K 4.4  CL 112*  CO2 24  GLUCOSE 111*  BUN 15  CREATININE 1.19*  CALCIUM 8.6*  MG 1.6*  AST 20  ALT 12*  ALKPHOS 25*  BILITOT 1.0   Cardiac Enzymes No results for input(s): TROPONINI in the last 168 hours. RADIOLOGY:  No results found. ASSESSMENT AND PLAN:  Stephanie Harris  is a 82 y.o. female with a known history of CML, essential hypertension, large granulocyte lymphocytic disorder, sees [name] not on any chemotherapy or radiation therapy, multiple drug resistant UTIs is presenting to the ED with  complaint of severe dysuria. Patient has been taking Macrobid for the past 2 weeks no improvement. Reporting lower abdominal pain.  # Severe dysuria with recurrent multidrug resistant UTIs Urine culture and sensitivities ordered  on meropenem IV Recommendation patient to follow-up with [facility] [facility] as an outpatient as scheduled Will consider ID consult depending on the culture results  # hypokalemia Replete potassium and check potassium and magnesium levels  #History of coronary artery disease. - Currently patient is asymptomatic -Resume her home medications Coreg and aspirin  #Hypothyroidism continue Synthroid  alternate 50 and  75 g every other day  Case discussed with Care Management/Social Worker. Management plans discussed with the patient, family and they are in agreement.  CODE STATUS: full DVT Prophylaxis:lovenox TOTAL TIME TAKING  CARE OF THIS PATIENT:79minutes.  >50% time spent on counselling and coordination of care  POSSIBLE D/C IN 1-2 DAYS, DEPENDING ON CLINICAL CONDITION.  Note: This dictation was prepared with Dragon dictation along with smaller phrase technology. Any transcriptional errors that result from this process are unintentional.  Shanekia Latella M.D on 04/29/2016 at 2:27 PM  Between 7am to 6pm - Pager - [phone]  After 6pm go to [email or web address] - password [email or web address] [place] [facility]  Office  [phone]  CC: Primary care physician; [name], MD

## 2016-04-29 NOTE — Care Management (Signed)
Patient admitted from home with acute cystitis.  Patient states that she lives at home with her husband.  Patient states that her husband provides transportation.  RW, BSC, and shower chair in the home.  Obtains medications at [facility].  Patient states that she has a history of falls in the home and "my knees just give out on me".   PT consult has been ordered.  Patient states that she "doesn't want anyone coming out to the house".  Patient states that she rather go to an outpatient PT is indicated. RNCM following for discharge planning.

## 2016-04-30 NOTE — Discharge Summary (Signed)
Stephanie Harris at [place] NAME: Stephanie Harris    MR#:  [id number]  DATE OF BIRTH:  1930-10-18  DATE OF ADMISSION:  04/28/2016 ADMITTING PHYSICIAN: [name], MD  DATE OF DISCHARGE: 04/30/16  PRIMARY CARE PHYSICIAN: [name], MD    ADMISSION DIAGNOSIS:  UTI (lower urinary tract infection) [N39.0]  DISCHARGE DIAGNOSIS:  Dysuria (Just finished treatment with Macrobid for 2 weeks)   SECONDARY DIAGNOSIS:   Past Medical History:  Diagnosis Date  . CML (chronic myelocytic leukemia) ([place])    Onc at [facility]  . Heart disease   . Hypertension   . Leukemia ([place])   . RA (rheumatoid arthritis) ([place])   . Stroke ([place])   . Thalassemia     HOSPITAL COURSE:   Stephanie Websteris a 82 y.o. femalewith a known history of CML, essential hypertension, large granulocyte lymphocytic disorder,sees [name] not on any chemotherapy or radiation therapy, multiple drug resistant UTIs is presenting to the ED with  complaint of severe dysuria. Patient has been taking Macrobid for the past 2 weeks no improvement. Reporting lower abdominal pain.  # symptoms of  dysuria with recurrent multidrug resistant UTIs Urine culture NEGATIVE D/c meropenem IV Recommendation patient to follow-up with [facility] as an outpatient as scheduled Pt just finished course of macrobid  # hypokalemia Repleted  #History of coronary artery disease. - Currently patient is asymptomatic -Resume her home medications Coreg and aspirin  #Hypothyroidism continue Synthroid  alternate50 and 75 g every other day  Overall stable D/c home after seen by PT today CONSULTS OBTAINED:    DRUG ALLERGIES:   Allergies  Allergen Reactions  . Penicillins Other (See Comments)    Reaction: pt unsure. Chart had Hives listed.    DISCHARGE MEDICATIONS:   Current Discharge Medication List    CONTINUE these medications which have NOT CHANGED   Details  ascorbic acid (VITAMIN  C) 1000 MG tablet Take 1,000 mg by mouth daily.    aspirin 81 MG chewable tablet Chew by mouth daily.    carvedilol (COREG) 3.125 MG tablet Take 6.25 mg by mouth 2 (two) times daily.     cholecalciferol (VITAMIN D) 1000 UNITS tablet Take 1,000 Units by mouth daily.    folic acid (FOLVITE) 1 MG tablet Take 1 mg by mouth daily.    !! levothyroxine (SYNTHROID, LEVOTHROID) 50 MCG tablet Take 50 mcg by mouth every other day. On the opposite days from taking 93mcg.    vitamin E 400 UNIT capsule Take 400 Units by mouth daily.    amLODipine (NORVASC) 5 MG tablet Take 1 tablet by mouth daily.    aspirin EC 325 MG EC tablet Take 1 tablet (325 mg total) by mouth daily. Qty: 30 tablet, Refills: 0    glimepiride (AMARYL) 1 MG tablet Take 1 tablet by mouth daily with supper.    !! levothyroxine (SYNTHROID, LEVOTHROID) 75 MCG tablet Take 75 mcg by mouth every other day. On the opposite days from taking 12mcg    ondansetron (ZOFRAN ODT) 8 MG disintegrating tablet Take 1 tablet (8 mg total) by mouth every 8 (eight) hours as needed for nausea or vomiting. Qty: 20 tablet, Refills: 0    saccharomyces boulardii (FLORASTOR) 250 MG capsule Take 1 capsule (250 mg total) by mouth 2 (two) times daily. Qty: 30 capsule, Refills: 0     !! - Potential duplicate medications found. Please discuss with provider.      If you experience  worsening of your admission symptoms, develop shortness of breath, life threatening emergency, suicidal or homicidal thoughts you must seek medical attention immediately by calling 911 or calling your MD immediately  if symptoms less severe.  You Must read complete instructions/literature along with all the possible adverse reactions/side effects for all the Medicines you take and that have been prescribed to you. Take any new Medicines after you have completely understood and accept all the possible adverse reactions/side effects.   Please note  You were cared for by a  hospitalist during your hospital stay. If you have any questions about your discharge medications or the care you received while you were in the hospital after you are discharged, you can call the unit and asked to speak with the hospitalist on call if the hospitalist that took care of you is not available. Once you are discharged, your primary care physician will handle any further medical issues. Please note that NO REFILLS for any discharge medications will be authorized once you are discharged, as it is imperative that you return to your primary care physician (or establish a relationship with a primary care physician if you do not have one) for your aftercare needs so that they can reassess your need for medications and monitor your lab values. Today   SUBJECTIVE   I was having palpitations. HR in the 70-80's  VITAL SIGNS:  Blood pressure (!) 156/73, pulse 85, temperature 98.2 F (36.8 C), temperature source Oral, resp. rate 18, height 5' (1.524 m), weight 54.4 kg (120 lb), SpO2 92 %.  I/O:   Intake/Output Summary (Last 24 hours) at 04/30/16 0917 Last data filed at 04/30/16 7322  Gross per 24 hour  Intake              350 ml  Output                0 ml  Net              350 ml    PHYSICAL EXAMINATION:  GENERAL:  82 y.o.-year-old patient lying in the bed with no acute distress.  EYES: Pupils equal, round, reactive to light and accommodation. No scleral icterus. Extraocular muscles intact.  HEENT: Head atraumatic, normocephalic. Oropharynx and nasopharynx clear.  NECK:  Supple, no jugular venous distention. No thyroid enlargement, no tenderness.  LUNGS: Normal breath sounds bilaterally, no wheezing, rales,rhonchi or crepitation. No use of accessory muscles of respiration.  CARDIOVASCULAR: S1, S2 normal. No murmurs, rubs, or gallops.  ABDOMEN: Soft, non-tender, non-distended. Bowel sounds present. No organomegaly or mass.  EXTREMITIES: No pedal edema, cyanosis, or clubbing.   NEUROLOGIC: Cranial nerves II through XII are intact. Muscle strength 5/5 in all extremities. Sensation intact. Gait not checked.  PSYCHIATRIC: The patient is alert and oriented x 3.  SKIN: No obvious rash, lesion, or ulcer.   DATA REVIEW:   CBC   Recent Labs Lab 04/29/16 0429  WBC 5.5  HGB 8.9*  HCT 27.8*  PLT 76*    Chemistries   Recent Labs Lab 04/29/16 0429  NA 140  K 4.4  CL 112*  CO2 24  GLUCOSE 111*  BUN 15  CREATININE 1.19*  CALCIUM 8.6*  MG 1.6*  AST 20  ALT 12*  ALKPHOS 25*  BILITOT 1.0    Microbiology Results   Recent Results (from the past 240 hour(s))  Urine culture     Status: None   Collection Time: 04/28/16  1:34 PM  Result Value Ref Range Status  Specimen Description URINE, RANDOM  Final   Special Requests NONE  Final   Culture NO GROWTH Performed at [facility]   Final   Report Status 04/29/2016 FINAL  Final    RADIOLOGY:  No results found.   Management plans discussed with the patient, family and they are in agreement.  CODE STATUS:     Code Status Orders        Start     Ordered   04/28/16 1821  Full code  Continuous     04/28/16 1820    Code Status History    Date Active Date Inactive Code Status Order ID Comments User Context   03/14/2015 12:55 PM 03/15/2015  6:08 PM Full Code [id number]  [name], MD Inpatient   01/29/2015  5:27 AM 01/30/2015  5:24 PM Full Code [id number]  Harrie Foreman, MD Inpatient      TOTAL TIME TAKING CARE OF THIS PATIENT: 40 minutes.    Bertice Risse M.D on 04/30/2016 at 9:17 AM  Between 7am to 6pm - Pager - [phone] After 6pm go to [email or web address] - password [email or web address] [place] [facility]  Office  [phone]  CC: Primary care physician; [name], MD

## 2016-04-30 NOTE — Progress Notes (Signed)
IV was removed. Discharge instructions, follow-up appointments, and prescriptions were provided to the pt. The pt is awaiting her husband to pick her up.

## 2016-04-30 NOTE — Evaluation (Signed)
Physical Therapy Evaluation Patient Details Name: MAHASIN RIVIERE MRN: [id number] DOB: 1931/07/02 Today's Date: 04/30/2016   History of Present Illness  Pt is an 82 y/o female presenting with severe dysuria and recurrent UTIs. Pt admitted due to UTI for IV antibiotics. Pt has a PMH of chronic myelocytic leukemia, heart disease, HTN, RA, and stroke (pt reports greater weakness on the R extremities). Past surgeries include a L rotator cuff repair, R TKA, carotid endartectomy, and percutaneous coronary stent intervention.   Clinical Impression  Pt lives at home with husband at baseline and ambulates in home distances using a rollator and uses a motorized scooter for community distances. Pt spouse assists with driving and housekeeping. Pt demonstrates generalized weakness with noted decreased in R UE AROM. Pt requires increased time and bedrail assistance for bed mobility. Pt is able to transfer sit to stand CGA with multiple attempts. Pt is able to ambulate short distances using a RW and CGA. During ambulation, patient has occasional mild knee buckling, which she can self correct using UE support on the RW (pt reports that her knees have been buckling for some time and that she has an appointment made with an orthopedic surgeon about this impairment) . Pt reports spouse is retired and able to provide assistance with transfers and ambulation 24/7.  Pt will benefit from continued PT services in order to address these impairments.    Follow Up Recommendations Home health PT;Supervision/Assistance - 24 hour    Equipment Recommendations  Rolling walker with 5" wheels    Recommendations for Other Services       Precautions / Restrictions Precautions Precautions: Fall Restrictions Weight Bearing Restrictions: No      Mobility  Bed Mobility Overal bed mobility: Modified Independent             General bed mobility comments: Pt required use of bed rail to complete rolling and push from supine to  seated EOB position. Pt able to shift hips in order to get B feet on the floor.  Transfers Overall transfer level: Needs assistance Equipment used: Rolling walker (2 wheeled) Transfers: Sit to/from [facility]   Stand pivot transfers: Min guard (bed to bedside chair)       General transfer comment: Pt initially +1 min assist to complete sit to stand transfer and pt fatigued quickly requiring pt to sit. With verbal cuing pt able to complete transfer after several attempts +1 CGA. Pt requires cuing to push from stable surface for transfers. (x3 sit to stand total)  Ambulation/Gait Ambulation/Gait assistance: Modified independent (Device/Increase time);Min guard Ambulation Distance (Feet): 85 Feet Assistive device: Rolling walker (2 wheeled) Gait Pattern/deviations: Step-through pattern Gait velocity: mildly decreased   General Gait Details: Pt had 2 instances of mild knee buckling and was able to self correct using RW.   Stairs Stairs:  (Deferred due to pt has a ramp to enter home)          Wheelchair Mobility    Modified Rankin (Stroke Patients Only)       Balance Overall balance assessment: History of Falls;Needs assistance Sitting-balance support: Feet supported Sitting balance-Leahy Scale: Good     Standing balance support: Bilateral upper extremity supported (on RW) Standing balance-Leahy Scale: Fair                               Pertinent Vitals/Pain Pain Assessment: No/denies pain    Home Living Family/patient expects to be  discharged to:: Private residence Living Arrangements: Spouse/significant other Available Help at Discharge: Family;Available 24 hours/day Type of Home: House Home Access: Ramped entrance     Home Layout: One level Home Equipment: Walker - 4 wheels;Shower seat;Bedside commode      Prior Function Level of Independence: Independent with assistive device(s);Needs assistance   Gait / Transfers Assistance  Needed: Pt ambulates mod independently with rollator and uses store motorized scooters for community mobility  ADL's / Homemaking Assistance Needed: Pt no longer drives and requires spouse assistance with getting to doctor appts and grocery shopping  Comments: Pt reports 2 falls in the past 6 months due to "knees giving out", one resulting in a head lacceration requiring 7 staples. Pt reports an appt with orthopedic physician September 15th to address knee impairments.      Hand Dominance        Extremity/Trunk Assessment   Upper Extremity Assessment: RUE deficits/detail RUE Deficits / Details: Pt only able to actively flex R UE to approximately 100 degrees         Lower Extremity Assessment: Generalized weakness (at least 3-/5 B knee flex/ext from AROM)         Communication   Communication: No difficulties  Cognition Arousal/Alertness: Awake/alert Behavior During Therapy: WFL for tasks assessed/performed Overall Cognitive Status: Within Functional Limits for tasks assessed                      General Comments      Exercises        Assessment/Plan    PT Assessment Patient needs continued PT services  PT Diagnosis Generalized weakness   PT Problem List Decreased strength;Decreased activity tolerance;Decreased balance;Decreased safety awareness  PT Treatment Interventions Gait training;Therapeutic activities;Therapeutic exercise;Functional mobility training;Balance training;Patient/family education;DME instruction   PT Goals (Current goals can be found in the Care Plan section) Acute Rehab PT Goals Patient Stated Goal: To go home.  PT Goal Formulation: With patient Time For Goal Achievement: 05/14/16 Potential to Achieve Goals: Good    Frequency Min 2X/week   Barriers to discharge        Co-evaluation               End of Session Equipment Utilized During Treatment: Gait belt Activity Tolerance: Patient tolerated treatment well Patient  left: in chair;with call bell/phone within reach;with chair alarm set Nurse Communication: Mobility status (and pt request for toothbrush)         Time: 1324-4010 PT Time Calculation (min) (ACUTE ONLY): 33 min   Charges:         PT G Codes:        Lilias Lorensen, SPT 04/30/2016, 12:46 PM

## 2016-04-30 NOTE — Care Management (Signed)
Patient to discharge home today.  PT has assessed patient and recommended home health PT.  Followed up with patient to notify her of the recommendations.  Patient still declines any home health services.  Patient states that she has a follow up appointment with Dr. Marry Guan on 05/08/16, and if she feels like she needs outpatient PT she will get it from him then.  RNCM signing off.

## 2016-05-08 DIAGNOSIS — D638 Anemia in other chronic diseases classified elsewhere: Secondary | ICD-10-CM | POA: Diagnosis not present

## 2016-05-21 DIAGNOSIS — C91Z0 Other lymphoid leukemia not having achieved remission: Secondary | ICD-10-CM | POA: Diagnosis not present

## 2016-05-21 DIAGNOSIS — D563 Thalassemia minor: Secondary | ICD-10-CM | POA: Diagnosis not present

## 2016-05-21 DIAGNOSIS — K5909 Other constipation: Secondary | ICD-10-CM | POA: Diagnosis not present

## 2016-05-21 DIAGNOSIS — D638 Anemia in other chronic diseases classified elsewhere: Secondary | ICD-10-CM | POA: Diagnosis not present

## 2016-05-21 DIAGNOSIS — R3129 Other microscopic hematuria: Secondary | ICD-10-CM | POA: Diagnosis not present

## 2016-05-21 DIAGNOSIS — N3945 Continuous leakage: Secondary | ICD-10-CM | POA: Diagnosis not present

## 2016-05-21 DIAGNOSIS — I129 Hypertensive chronic kidney disease with stage 1 through stage 4 chronic kidney disease, or unspecified chronic kidney disease: Secondary | ICD-10-CM | POA: Diagnosis not present

## 2016-05-21 DIAGNOSIS — D631 Anemia in chronic kidney disease: Secondary | ICD-10-CM | POA: Diagnosis not present

## 2016-05-21 DIAGNOSIS — N390 Urinary tract infection, site not specified: Secondary | ICD-10-CM | POA: Diagnosis not present

## 2016-05-21 DIAGNOSIS — N189 Chronic kidney disease, unspecified: Secondary | ICD-10-CM | POA: Diagnosis not present

## 2016-05-22 DIAGNOSIS — Z4682 Encounter for fitting and adjustment of non-vascular catheter: Secondary | ICD-10-CM | POA: Diagnosis not present

## 2016-05-22 DIAGNOSIS — R1030 Lower abdominal pain, unspecified: Secondary | ICD-10-CM | POA: Diagnosis not present

## 2016-05-22 DIAGNOSIS — Z79899 Other long term (current) drug therapy: Secondary | ICD-10-CM | POA: Diagnosis not present

## 2016-06-04 DIAGNOSIS — Z23 Encounter for immunization: Secondary | ICD-10-CM | POA: Diagnosis not present

## 2016-06-04 DIAGNOSIS — N390 Urinary tract infection, site not specified: Secondary | ICD-10-CM | POA: Diagnosis not present

## 2016-06-04 DIAGNOSIS — N189 Chronic kidney disease, unspecified: Secondary | ICD-10-CM | POA: Diagnosis not present

## 2016-06-04 DIAGNOSIS — D631 Anemia in chronic kidney disease: Secondary | ICD-10-CM | POA: Diagnosis not present

## 2016-06-04 DIAGNOSIS — I129 Hypertensive chronic kidney disease with stage 1 through stage 4 chronic kidney disease, or unspecified chronic kidney disease: Secondary | ICD-10-CM | POA: Diagnosis not present

## 2016-06-11 DIAGNOSIS — N390 Urinary tract infection, site not specified: Secondary | ICD-10-CM | POA: Diagnosis not present

## 2016-06-11 DIAGNOSIS — Z87440 Personal history of urinary (tract) infections: Secondary | ICD-10-CM | POA: Diagnosis not present

## 2016-06-11 DIAGNOSIS — R3129 Other microscopic hematuria: Secondary | ICD-10-CM | POA: Diagnosis not present

## 2016-06-18 DIAGNOSIS — D630 Anemia in neoplastic disease: Secondary | ICD-10-CM | POA: Diagnosis not present

## 2016-06-18 DIAGNOSIS — C91Z0 Other lymphoid leukemia not having achieved remission: Secondary | ICD-10-CM | POA: Diagnosis not present

## 2016-06-25 DIAGNOSIS — N329 Bladder disorder, unspecified: Secondary | ICD-10-CM | POA: Diagnosis not present

## 2016-06-25 DIAGNOSIS — N3001 Acute cystitis with hematuria: Secondary | ICD-10-CM | POA: Diagnosis not present

## 2016-06-25 DIAGNOSIS — R339 Retention of urine, unspecified: Secondary | ICD-10-CM | POA: Insufficient documentation

## 2016-06-25 DIAGNOSIS — Z87440 Personal history of urinary (tract) infections: Secondary | ICD-10-CM | POA: Diagnosis not present

## 2016-06-25 DIAGNOSIS — N390 Urinary tract infection, site not specified: Secondary | ICD-10-CM | POA: Diagnosis not present

## 2016-06-25 DIAGNOSIS — R310 Gross hematuria: Secondary | ICD-10-CM | POA: Insufficient documentation

## 2016-07-02 DIAGNOSIS — C91Z0 Other lymphoid leukemia not having achieved remission: Secondary | ICD-10-CM | POA: Diagnosis not present

## 2016-07-02 DIAGNOSIS — D631 Anemia in chronic kidney disease: Secondary | ICD-10-CM | POA: Diagnosis not present

## 2016-07-02 DIAGNOSIS — N189 Chronic kidney disease, unspecified: Secondary | ICD-10-CM | POA: Diagnosis not present

## 2016-07-04 ENCOUNTER — Emergency Department: Payer: Medicare Other

## 2016-07-04 ENCOUNTER — Emergency Department
Admission: EM | Admit: 2016-07-04 | Discharge: 2016-07-04 | Disposition: A | Discharge status: Home / Self Care | Payer: Medicare Other | Attending: Emergency Medicine | Admitting: Emergency Medicine

## 2016-07-04 ENCOUNTER — Encounter: Payer: Self-pay | Admitting: Emergency Medicine

## 2016-07-04 DIAGNOSIS — Z79899 Other long term (current) drug therapy: Secondary | ICD-10-CM | POA: Diagnosis not present

## 2016-07-04 DIAGNOSIS — Y9389 Activity, other specified: Secondary | ICD-10-CM | POA: Diagnosis not present

## 2016-07-04 DIAGNOSIS — W19XXXA Unspecified fall, initial encounter: Secondary | ICD-10-CM

## 2016-07-04 DIAGNOSIS — Z856 Personal history of leukemia: Secondary | ICD-10-CM | POA: Insufficient documentation

## 2016-07-04 DIAGNOSIS — Z7982 Long term (current) use of aspirin: Secondary | ICD-10-CM | POA: Insufficient documentation

## 2016-07-04 DIAGNOSIS — Z91040 Latex allergy status: Secondary | ICD-10-CM | POA: Diagnosis not present

## 2016-07-04 DIAGNOSIS — W1839XA Other fall on same level, initial encounter: Secondary | ICD-10-CM | POA: Diagnosis not present

## 2016-07-04 DIAGNOSIS — S42201A Unspecified fracture of upper end of right humerus, initial encounter for closed fracture: Secondary | ICD-10-CM | POA: Diagnosis not present

## 2016-07-04 DIAGNOSIS — S42202A Unspecified fracture of upper end of left humerus, initial encounter for closed fracture: Secondary | ICD-10-CM | POA: Insufficient documentation

## 2016-07-04 DIAGNOSIS — S42292A Other displaced fracture of upper end of left humerus, initial encounter for closed fracture: Secondary | ICD-10-CM | POA: Diagnosis not present

## 2016-07-04 DIAGNOSIS — Y929 Unspecified place or not applicable: Secondary | ICD-10-CM | POA: Insufficient documentation

## 2016-07-04 DIAGNOSIS — M25512 Pain in left shoulder: Secondary | ICD-10-CM | POA: Diagnosis not present

## 2016-07-04 DIAGNOSIS — Y999 Unspecified external cause status: Secondary | ICD-10-CM | POA: Diagnosis not present

## 2016-07-04 DIAGNOSIS — I10 Essential (primary) hypertension: Secondary | ICD-10-CM | POA: Diagnosis not present

## 2016-07-04 DIAGNOSIS — S4992XA Unspecified injury of left shoulder and upper arm, initial encounter: Secondary | ICD-10-CM | POA: Diagnosis present

## 2016-07-04 MED ORDER — MORPHINE SULFATE (PF) 4 MG/ML IV SOLN
4.0000 mg | Freq: Once | INTRAVENOUS | Status: AC
  Administered 2016-07-04: 4 mg via INTRAMUSCULAR
  Filled 2016-07-04: qty 1

## 2016-07-04 MED ORDER — ONDANSETRON 4 MG PO TBDP
4.0000 mg | ORAL_TABLET | Freq: Once | ORAL | Status: AC
  Administered 2016-07-04: 4 mg via ORAL
  Filled 2016-07-04: qty 1

## 2016-07-04 MED ORDER — HYDROCODONE-ACETAMINOPHEN 5-325 MG PO TABS
1.0000 | ORAL_TABLET | Freq: Once | ORAL | Status: AC
  Administered 2016-07-04: 1 via ORAL
  Filled 2016-07-04: qty 1

## 2016-07-04 MED ORDER — HYDROCODONE-ACETAMINOPHEN 5-325 MG PO TABS: 1.0000 | ORAL_TABLET | Freq: Four times a day (QID) | ORAL | 0 refills | Status: DC | PRN

## 2016-07-04 NOTE — ED Triage Notes (Signed)
Pt arrived by EMS from home after falling while carrying groceries in. Pt states she has had multiple falls "since she had her stroke" which affected her right side. Pt c/o of right upper arm and shoulder pain.

## 2016-07-04 NOTE — ED Notes (Signed)
Sling applied to left arm.

## 2016-07-04 NOTE — Discharge Instructions (Signed)
Please seek medical attention for any high fevers, chest pain, shortness of breath, change in behavior, persistent vomiting, bloody stool or any other new or concerning symptoms.  

## 2016-07-05 NOTE — ED Provider Notes (Signed)
[facility] Emergency Department Provider Note   ____________________________________________   I have reviewed the triage vital signs and the nursing notes.   HISTORY  Chief Complaint Fall   History limited by: Not Limited   HPI Stephanie Harris is a 82 y.o. female who presents to the emergency department today after a mechanical fall. She was carrying groceries at the time and lost her balance. She denies any chest pain or shortness of breath, no palpitations during the time of the fall.  She did not hit her head. She put out her arm and fell on her left side. Complaining of pain to her left shoulder. Denies pain anywhere else.    Past Medical History:  Diagnosis Date  . CML (chronic myelocytic leukemia) ([place])    Onc at [facility]  . Heart disease   . Hypertension   . Leukemia ([place])   . RA (rheumatoid arthritis) ([place])   . Stroke ([place])   . Thalassemia     Patient Active Problem List   Diagnosis Date Noted  . Acute cystitis 04/28/2016  . Chest pain 03/14/2015  . TIA (transient ischemic attack) 01/29/2015  . Allergic arthritis, hand     Past Surgical History:  Procedure Laterality Date  . ABDOMINAL HYSTERECTOMY    . CAROTID ENDARTERECTOMY Left   . CATARACT EXTRACTION    . CHOLECYSTECTOMY    . PERCUTANEOUS CORONARY STENT INTERVENTION (PCI-S)    . ROTATOR CUFF REPAIR Left   . TOTAL KNEE ARTHROPLASTY Right     Prior to Admission medications   Medication Sig Start Date End Date Taking? Authorizing Provider  ascorbic acid (VITAMIN C) 1000 MG tablet Take 1,000 mg by mouth daily.   Yes Historical Provider, MD  aspirin 81 MG chewable tablet Chew by mouth daily.   Yes Historical Provider, MD  carvedilol (COREG) 3.125 MG tablet Take 6.25 mg by mouth 2 (two) times daily.    Yes Historical Provider, MD  cholecalciferol (VITAMIN D) 1000 UNITS tablet Take 1,000 Units by mouth daily.   Yes Historical Provider, MD  folic acid (FOLVITE) 1 MG tablet Take 1 mg by  mouth daily.   Yes Historical Provider, MD  levothyroxine (SYNTHROID, LEVOTHROID) 50 MCG tablet Take 50 mcg by mouth daily. On the opposite days from taking 35mcg.   Yes Historical Provider, MD  saccharomyces boulardii (FLORASTOR) 250 MG capsule Take 1 capsule (250 mg total) by mouth 2 (two) times daily. 01/30/15  Yes [name], MD  vitamin E 400 UNIT capsule Take 400 Units by mouth daily.   Yes Historical Provider, MD  HYDROcodone-acetaminophen (NORCO) 5-325 MG tablet Take 1 tablet by mouth every 6 (six) hours as needed for moderate pain. 07/04/16   Nance Pear, MD    Allergies Penicillins and Latex  Family History  Problem Relation Age of Onset  . Acute myelogenous leukemia Brother   . Stroke Mother   . Heart attack Father     Social History Social History  Substance Use Topics  . Smoking status: Never Smoker  . Smokeless tobacco: Never Used  . Alcohol use No    Review of Systems  Constitutional: Negative for fever. Cardiovascular: Negative for chest pain. Respiratory: Negative for shortness of breath. Gastrointestinal: Negative for abdominal pain, vomiting and diarrhea. Musculoskeletal: Positive for left shoulder pain. Skin: Negative for rash. Neurological: Negative for headaches, focal weakness or numbness.  10-point ROS otherwise negative.  ____________________________________________   PHYSICAL EXAM:  VITAL SIGNS: ED Triage Vitals  Enc Vitals  Group     BP 07/04/16 1228 (!) 173/88     Pulse Rate 07/04/16 1228 86     Resp 07/04/16 1228 18     Temp 07/04/16 1228 97.6 F (36.4 C)     Temp Source 07/04/16 1228 Oral     SpO2 07/04/16 1228 100 %     Weight 07/04/16 1229 120 lb (54.4 kg)     Height 07/04/16 1229 5' (1.524 m)     Head Circumference --      Peak Flow --      Pain Score 07/04/16 1229 8   Constitutional: Alert and oriented. Well appearing and in no distress. Eyes: Conjunctivae are normal. Normal extraocular movements. ENT   Head:  Normocephalic and atraumatic.   Nose: No congestion/rhinnorhea.   Mouth/Throat: Mucous membranes are moist.   Neck: No stridor. Hematological/Lymphatic/Immunilogical: No cervical lymphadenopathy. Cardiovascular: Normal rate, regular rhythm.  No murmurs, rubs, or gallops.  Respiratory: Normal respiratory effort without tachypnea nor retractions. Breath sounds are clear and equal bilaterally. No wheezes/rales/rhonchi. Gastrointestinal: Soft and nontender. No distention.  Genitourinary: Deferred Musculoskeletal: Limited range of motion of the left upper extremity secondary to tenderness. Tender to palpation. NV intact distally. Neurologic:  Normal speech and language. No gross focal neurologic deficits are appreciated.  Skin:  Skin is warm, dry and intact. No rash noted. Psychiatric: Mood and affect are normal. Speech and behavior are normal. Patient exhibits appropriate insight and judgment.  ____________________________________________    LABS (pertinent positives/negatives)  None  ____________________________________________   EKG  None  ____________________________________________    RADIOLOGY  CXR  IMPRESSION: Impaction fracture the proximal LEFT humerus.  No acute cardiopulmonary process.  Left shoulder IMPRESSION: 1. Mildly displaced fracture through the humeral neck. 2. Evaluation for shoulder dislocation is limited on this study. If there is concern, recommend dedicated images of the shoulder.  I, Ilijah Doucet, personally viewed and evaluated these images (plain radiographs of shoulder) as part of my medical decision making. ____________________________________________   PROCEDURES  Procedures  ____________________________________________   INITIAL IMPRESSION / ASSESSMENT AND PLAN / ED COURSE  Pertinent labs & imaging results that were available during my care of the patient were reviewed by me and considered in my medical decision making  (see chart for details).  Patient presented after mechanical fall and pain to left shoulder, x-ray consistent with humeral fracture. Will place patient in sling. Will have patient follow up with orthopedics.  ____________________________________________   FINAL CLINICAL IMPRESSION(S) / ED DIAGNOSES  Final diagnoses:  Fall  Humerus head fracture, left, closed, initial encounter     Note: This dictation was prepared with Dragon dictation. Any transcriptional errors that result from this process are unintentional    Nance Pear, MD 07/05/16 1225

## 2016-07-08 ENCOUNTER — Emergency Department
Admission: EM | Admit: 2016-07-08 | Discharge: 2016-07-08 | Disposition: A | Discharge status: Home / Self Care | Payer: Medicare Other | Attending: Emergency Medicine | Admitting: Emergency Medicine

## 2016-07-08 DIAGNOSIS — W19XXXD Unspecified fall, subsequent encounter: Secondary | ICD-10-CM | POA: Insufficient documentation

## 2016-07-08 DIAGNOSIS — Z91040 Latex allergy status: Secondary | ICD-10-CM | POA: Insufficient documentation

## 2016-07-08 DIAGNOSIS — S4992XD Unspecified injury of left shoulder and upper arm, subsequent encounter: Secondary | ICD-10-CM | POA: Diagnosis present

## 2016-07-08 DIAGNOSIS — Z79899 Other long term (current) drug therapy: Secondary | ICD-10-CM | POA: Insufficient documentation

## 2016-07-08 DIAGNOSIS — S42295D Other nondisplaced fracture of upper end of left humerus, subsequent encounter for fracture with routine healing: Secondary | ICD-10-CM | POA: Insufficient documentation

## 2016-07-08 DIAGNOSIS — Z7982 Long term (current) use of aspirin: Secondary | ICD-10-CM | POA: Insufficient documentation

## 2016-07-08 DIAGNOSIS — I10 Essential (primary) hypertension: Secondary | ICD-10-CM | POA: Insufficient documentation

## 2016-07-08 MED ORDER — ONDANSETRON 4 MG PO TBDP
4.0000 mg | ORAL_TABLET | Freq: Once | ORAL | Status: AC
  Administered 2016-07-08: 4 mg via ORAL
  Filled 2016-07-08: qty 1

## 2016-07-08 MED ORDER — HYDROCODONE-ACETAMINOPHEN 5-325 MG PO TABS: 1.0000 | ORAL_TABLET | ORAL | 0 refills | Status: AC | PRN

## 2016-07-08 MED ORDER — ONDANSETRON 4 MG PO TBDP: 4.0000 mg | ORAL_TABLET | Freq: Three times a day (TID) | ORAL | 0 refills | Status: DC | PRN

## 2016-07-08 MED ORDER — HYDROCODONE-ACETAMINOPHEN 5-325 MG PO TABS
1.0000 | ORAL_TABLET | Freq: Once | ORAL | Status: AC
  Administered 2016-07-08: 1 via ORAL
  Filled 2016-07-08: qty 1

## 2016-07-08 NOTE — ED Triage Notes (Signed)
Pt states she fell Saturday and broke her left arm and is having a lot of pain and discomfort.

## 2016-07-08 NOTE — ED Notes (Signed)
Shoulder immobilizer placed to left arm for better support per MD request. Sling sent home with pt.

## 2016-07-08 NOTE — ED Provider Notes (Signed)
[facility] Emergency Department Provider Note  Time seen: 2:43 PM  I have reviewed the triage vital signs and the nursing notes.   HISTORY  Chief Complaint Arm Pain    HPI Stephanie Harris is a 82 y.o. female who presents to the emergency department with left arm pain. According to the patient she fell 4 days ago suffering a proximal humerus fracture. Patient has been taking her pain medication, but states she is almost out, states her shoulders hurting her and she does not feel that the sling is supporting her shoulder adequately. Patient has called orthopedics but states they cannot see her until 07/13/16. Patient denies any new injuries. Describes her pain as severe currently.  Past Medical History:  Diagnosis Date  . CML (chronic myelocytic leukemia) ([place])    Onc at [facility]  . Heart disease   . Hypertension   . Leukemia ([place])   . RA (rheumatoid arthritis) ([place])   . Stroke ([place])   . Thalassemia     Patient Active Problem List   Diagnosis Date Noted  . Acute cystitis 04/28/2016  . Chest pain 03/14/2015  . TIA (transient ischemic attack) 01/29/2015  . Allergic arthritis, hand     Past Surgical History:  Procedure Laterality Date  . ABDOMINAL HYSTERECTOMY    . CAROTID ENDARTERECTOMY Left   . CATARACT EXTRACTION    . CHOLECYSTECTOMY    . PERCUTANEOUS CORONARY STENT INTERVENTION (PCI-S)    . ROTATOR CUFF REPAIR Left   . TOTAL KNEE ARTHROPLASTY Right     Prior to Admission medications   Medication Sig Start Date End Date Taking? Authorizing Provider  ascorbic acid (VITAMIN C) 1000 MG tablet Take 1,000 mg by mouth daily.    Historical Provider, MD  aspirin 81 MG chewable tablet Chew by mouth daily.    Historical Provider, MD  carvedilol (COREG) 3.125 MG tablet Take 6.25 mg by mouth 2 (two) times daily.     Historical Provider, MD  cholecalciferol (VITAMIN D) 1000 UNITS tablet Take 1,000 Units by mouth daily.    Historical Provider, MD  folic acid  (FOLVITE) 1 MG tablet Take 1 mg by mouth daily.    Historical Provider, MD  HYDROcodone-acetaminophen (NORCO) 5-325 MG tablet Take 1 tablet by mouth every 6 (six) hours as needed for moderate pain. 07/04/16   Nance Pear, MD  levothyroxine (SYNTHROID, LEVOTHROID) 50 MCG tablet Take 50 mcg by mouth daily. On the opposite days from taking 33mcg.    Historical Provider, MD  saccharomyces boulardii (FLORASTOR) 250 MG capsule Take 1 capsule (250 mg total) by mouth 2 (two) times daily. 01/30/15   [name], MD  vitamin E 400 UNIT capsule Take 400 Units by mouth daily.    Historical Provider, MD    Allergies  Allergen Reactions  . Penicillins Other (See Comments)    Reaction: pt unsure. Chart had Hives listed.  . Latex Rash    Family History  Problem Relation Age of Onset  . Acute myelogenous leukemia Brother   . Stroke Mother   . Heart attack Father     Social History Social History  Substance Use Topics  . Smoking status: Never Smoker  . Smokeless tobacco: Never Used  . Alcohol use No    Review of Systems Constitutional: Negative for fever. Cardiovascular: Negative for chest pain. Respiratory: Negative for shortness of breath. Gastrointestinal: Negative for abdominal pain Neurological: Negative for headache 10-point ROS otherwise negative.  ____________________________________________   PHYSICAL EXAM:  VITAL  SIGNS: ED Triage Vitals  Enc Vitals Group     BP 07/08/16 1438 (!) 150/86     Pulse Rate 07/08/16 1438 94     Resp 07/08/16 1438 18     Temp 07/08/16 1438 97.3 F (36.3 C)     Temp Source 07/08/16 1438 Oral     SpO2 07/08/16 1438 96 %     Weight 07/08/16 1438 120 lb (54.4 kg)     Height 07/08/16 1438 5' (1.524 m)     Head Circumference --      Peak Flow --      Pain Score 07/08/16 1423 10     Pain Loc --      Pain Edu? --      Excl. in [place]? --     Constitutional: Alert and oriented. Well appearing and in no distress. Eyes: Normal exam ENT   Head:  Normocephalic and atraumatic.   Mouth/Throat: Mucous membranes are moist. Cardiovascular: Normal rate, regular rhythm. No murmur Respiratory: Normal respiratory effort without tachypnea nor retractions. Breath sounds are clear  Gastrointestinal: Soft and nontender. No distention.   Musculoskeletal: Patient's left arm is in a sling. Significant tenderness palpation over the proximal humerus. Neurovascularly intact distally with 2+ radial pulse. Able to move all fingers. Neurologic:  Normal speech and language. No gross focal neurologic deficits Skin:  Skin is warm, dry and intact.  Psychiatric: Mood and affect are normal.   ____________________________________________   INITIAL IMPRESSION / ASSESSMENT AND PLAN / ED COURSE  Pertinent labs & imaging results that were available during my care of the patient were reviewed by me and considered in my medical decision making (see chart for details).  The patient presents emergency Department with continued pain after suffering approximately he MRSA fracture 07/04/16. I reviewed the patient's x-rays. I do not believe repeat X-rays would be of much value today. Patient has an appointment with orthopedics 07/13/16. We will prescribe more pain medication. Patient states she does not feel she is getting any support from the sling. We will replace the sling with a shoulder immobilizer until the patient can be seen by orthopedics in 5 days.  ____________________________________________   FINAL CLINICAL IMPRESSION(S) / ED DIAGNOSES  Proximal humerus fracture    [name], MD 07/08/16 1445

## 2016-07-13 DIAGNOSIS — M25512 Pain in left shoulder: Secondary | ICD-10-CM | POA: Diagnosis not present

## 2016-07-13 DIAGNOSIS — S42225A 2-part nondisplaced fracture of surgical neck of left humerus, initial encounter for closed fracture: Secondary | ICD-10-CM | POA: Diagnosis not present

## 2016-07-15 DIAGNOSIS — C91Z0 Other lymphoid leukemia not having achieved remission: Secondary | ICD-10-CM | POA: Diagnosis not present

## 2016-07-15 DIAGNOSIS — D638 Anemia in other chronic diseases classified elsewhere: Secondary | ICD-10-CM | POA: Diagnosis not present

## 2016-07-17 ENCOUNTER — Encounter (HOSPITAL_COMMUNITY): Payer: Self-pay | Admitting: Emergency Medicine

## 2016-07-17 ENCOUNTER — Emergency Department (HOSPITAL_COMMUNITY)
Admission: EM | Admit: 2016-07-17 | Discharge: 2016-07-17 | Disposition: A | Discharge status: Home / Self Care | Payer: Medicare Other | Attending: Emergency Medicine | Admitting: Emergency Medicine

## 2016-07-17 DIAGNOSIS — M79602 Pain in left arm: Secondary | ICD-10-CM | POA: Diagnosis not present

## 2016-07-17 DIAGNOSIS — Y939 Activity, unspecified: Secondary | ICD-10-CM | POA: Insufficient documentation

## 2016-07-17 DIAGNOSIS — Y929 Unspecified place or not applicable: Secondary | ICD-10-CM | POA: Diagnosis not present

## 2016-07-17 DIAGNOSIS — I10 Essential (primary) hypertension: Secondary | ICD-10-CM | POA: Insufficient documentation

## 2016-07-17 DIAGNOSIS — Z96651 Presence of right artificial knee joint: Secondary | ICD-10-CM | POA: Diagnosis not present

## 2016-07-17 DIAGNOSIS — Z7982 Long term (current) use of aspirin: Secondary | ICD-10-CM | POA: Diagnosis not present

## 2016-07-17 DIAGNOSIS — X58XXXD Exposure to other specified factors, subsequent encounter: Secondary | ICD-10-CM | POA: Diagnosis not present

## 2016-07-17 DIAGNOSIS — S42402D Unspecified fracture of lower end of left humerus, subsequent encounter for fracture with routine healing: Secondary | ICD-10-CM | POA: Diagnosis not present

## 2016-07-17 DIAGNOSIS — S42202D Unspecified fracture of upper end of left humerus, subsequent encounter for fracture with routine healing: Secondary | ICD-10-CM | POA: Diagnosis not present

## 2016-07-17 DIAGNOSIS — Z91040 Latex allergy status: Secondary | ICD-10-CM | POA: Diagnosis not present

## 2016-07-17 DIAGNOSIS — Y999 Unspecified external cause status: Secondary | ICD-10-CM | POA: Diagnosis not present

## 2016-07-17 DIAGNOSIS — Z8673 Personal history of transient ischemic attack (TIA), and cerebral infarction without residual deficits: Secondary | ICD-10-CM | POA: Diagnosis not present

## 2016-07-17 DIAGNOSIS — Z79899 Other long term (current) drug therapy: Secondary | ICD-10-CM | POA: Diagnosis not present

## 2016-07-17 DIAGNOSIS — S59912A Unspecified injury of left forearm, initial encounter: Secondary | ICD-10-CM | POA: Diagnosis present

## 2016-07-17 NOTE — ED Provider Notes (Signed)
[facility] DEPT Provider Note   CSN: [id number] Arrival date & time: 07/17/16  1436     History   Chief Complaint Chief Complaint  Patient presents with  . Arm Injury    HPI Stephanie Harris is a 82 y.o. female with a past medical history of CML, HTN, RA who presents to the ED today complaining of left arm pain. Patient states that she broke her humerus over 2 weeks ago. She has been followed by orthopedics for this. She seldom 4 days ago. She states that she is unsatisfied with the sling that they placed on her and is requesting a new one. Her pain is worsened at night but is adequately managed at home with prescription pain medication. She denies any new trauma or injury, paresthesias or weakness. She is scheduled to see orthopedics again in 3 weeks.  HPI  Past Medical History:  Diagnosis Date  . CML (chronic myelocytic leukemia) ([place])    Onc at [facility]  . Heart disease   . Hypertension   . Leukemia ([place])   . RA (rheumatoid arthritis) ([place])   . Stroke ([place])   . Thalassemia     Patient Active Problem List   Diagnosis Date Noted  . Acute cystitis 04/28/2016  . Chest pain 03/14/2015  . TIA (transient ischemic attack) 01/29/2015  . Allergic arthritis, hand     Past Surgical History:  Procedure Laterality Date  . ABDOMINAL HYSTERECTOMY    . CAROTID ENDARTERECTOMY Left   . CATARACT EXTRACTION    . CHOLECYSTECTOMY    . PERCUTANEOUS CORONARY STENT INTERVENTION (PCI-S)    . ROTATOR CUFF REPAIR Left   . TOTAL KNEE ARTHROPLASTY Right     OB History    No data available       Home Medications    Prior to Admission medications   Medication Sig Start Date End Date Taking? Authorizing Provider  ascorbic acid (VITAMIN C) 1000 MG tablet Take 1,000 mg by mouth daily.    Historical Provider, MD  aspirin 81 MG chewable tablet Chew by mouth daily.    Historical Provider, MD  carvedilol (COREG) 3.125 MG tablet Take 6.25 mg by mouth 2 (two) times daily.     Historical  Provider, MD  cholecalciferol (VITAMIN D) 1000 UNITS tablet Take 1,000 Units by mouth daily.    Historical Provider, MD  folic acid (FOLVITE) 1 MG tablet Take 1 mg by mouth daily.    Historical Provider, MD  HYDROcodone-acetaminophen (NORCO/VICODIN) 5-325 MG tablet Take 1 tablet by mouth every 4 (four) hours as needed for moderate pain. 07/08/16 07/08/17  [name], MD  levothyroxine (SYNTHROID, LEVOTHROID) 50 MCG tablet Take 50 mcg by mouth daily. On the opposite days from taking 60mcg.    Historical Provider, MD  ondansetron (ZOFRAN ODT) 4 MG disintegrating tablet Take 1 tablet (4 mg total) by mouth every 8 (eight) hours as needed for nausea or vomiting. 07/08/16   [name], MD  saccharomyces boulardii (FLORASTOR) 250 MG capsule Take 1 capsule (250 mg total) by mouth 2 (two) times daily. 01/30/15   [name], MD  vitamin E 400 UNIT capsule Take 400 Units by mouth daily.    Historical Provider, MD    Family History Family History  Problem Relation Age of Onset  . Acute myelogenous leukemia Brother   . Stroke Mother   . Heart attack Father     Social History Social History  Substance Use Topics  . Smoking status: Never Smoker  .  Smokeless tobacco: Never Used  . Alcohol use No     Allergies   Penicillins and Latex   Review of Systems Review of Systems  All other systems reviewed and are negative.    Physical Exam Updated Vital Signs BP 151/64   Pulse 66   Temp 97.5 F (36.4 C) (Oral)   Resp 18   Ht 5' (1.524 m)   Wt 54.4 kg   SpO2 100%   BMI 23.44 kg/m   Physical Exam  Constitutional: She is oriented to person, place, and time. She appears well-developed and well-nourished. No distress.  HENT:  Head: Normocephalic and atraumatic.  Mouth/Throat: No oropharyngeal exudate.  Eyes: Conjunctivae and EOM are normal. Pupils are equal, round, and reactive to light. Right eye exhibits no discharge. Left eye exhibits no discharge. No scleral icterus.    Cardiovascular: Normal rate, regular rhythm, normal heart sounds and intact distal pulses.  Exam reveals no gallop and no friction rub.   No murmur heard. Pulmonary/Chest: Effort normal and breath sounds normal. No respiratory distress. She has no wheezes. She has no rales. She exhibits no tenderness.  Abdominal: Soft.  Musculoskeletal: Normal range of motion. She exhibits no edema.  Left arm sling in place. No obvious bony deformity. Ecchymosis on posterior left upper arm that appears to be old.  Neurological: She is alert and oriented to person, place, and time. No sensory deficit.  Skin: Skin is warm and dry. No rash noted. She is not diaphoretic. No erythema. No pallor.  Psychiatric: She has a normal mood and affect. Her behavior is normal.  Nursing note and vitals reviewed.    ED Treatments / Results  Labs (all labs ordered are listed, but only abnormal results are displayed) Labs Reviewed - No data to display  EKG  EKG Interpretation None       Radiology No results found.  Procedures Procedures (including critical care time)  Medications Ordered in ED Medications - No data to display   Initial Impression / Assessment and Plan / ED Course  I have reviewed the triage vital signs and the nursing notes.  Pertinent labs & imaging results that were available during my care of the patient were reviewed by me and considered in my medical decision making (see chart for details).  Clinical Course     82 y.o F presents to the ED today requesting new arm sling for left humeral fracture that occurred 2 weeks ago. Pt states that she saw ortho 4 days ago and they placed her in a sling that is uncomfortable. New sling was placed in triage to pt's satisfaction. Pt is c/o ongoing pain but declines pain medication in ED. She states that she has pain medication at home that she can take. Pt remains neurovascularly intact. She has scheduled outpatient ortho follow up in 3 weeks.  However, pt was not satisfied with her ortho provider in [place]. Local ortho referral given today at pt's request. No new trauma or injury. No other complaints at this time. Return precautions outlined in patient discharge instructions.   Patient was discussed with and seen by Dr. Ralene Bathe who agrees with the treatment plan.    Final Clinical Impressions(s) / ED Diagnoses   Final diagnoses:  Left arm pain  Closed fracture of proximal end of left humerus with routine healing, unspecified fracture morphology, subsequent encounter    New Prescriptions New Prescriptions   No medications on file     Carlos Levering, PA-C 07/18/16 1016  Quintella Reichert, MD 07/18/16 1549

## 2016-07-17 NOTE — Discharge Instructions (Signed)
Continue wearing sling. Follow up with orthopedics. Take pain medication as needed. Return to the Ed of you experience severe worsening of your symptoms, weakness, numbness in your arm or hand, swelling or redness.

## 2016-07-20 ENCOUNTER — Telehealth (INDEPENDENT_AMBULATORY_CARE_PROVIDER_SITE_OTHER): Payer: Self-pay | Admitting: Orthopaedic Surgery

## 2016-07-21 DIAGNOSIS — E119 Type 2 diabetes mellitus without complications: Secondary | ICD-10-CM | POA: Diagnosis not present

## 2016-07-21 DIAGNOSIS — I10 Essential (primary) hypertension: Secondary | ICD-10-CM | POA: Diagnosis not present

## 2016-07-21 DIAGNOSIS — M6281 Muscle weakness (generalized): Secondary | ICD-10-CM | POA: Diagnosis not present

## 2016-07-21 DIAGNOSIS — C9510 Chronic leukemia of unspecified cell type not having achieved remission: Secondary | ICD-10-CM | POA: Diagnosis not present

## 2016-07-21 DIAGNOSIS — S42202D Unspecified fracture of upper end of left humerus, subsequent encounter for fracture with routine healing: Secondary | ICD-10-CM | POA: Diagnosis not present

## 2016-07-21 DIAGNOSIS — M79602 Pain in left arm: Secondary | ICD-10-CM | POA: Diagnosis not present

## 2016-07-21 NOTE — Telephone Encounter (Signed)
Spoke with patient appointment 07/24/16 @ 1:30pm

## 2016-07-24 ENCOUNTER — Ambulatory Visit (INDEPENDENT_AMBULATORY_CARE_PROVIDER_SITE_OTHER): Payer: Medicare Other | Admitting: Orthopaedic Surgery

## 2016-07-24 ENCOUNTER — Encounter (INDEPENDENT_AMBULATORY_CARE_PROVIDER_SITE_OTHER): Payer: Self-pay | Admitting: Orthopaedic Surgery

## 2016-07-24 ENCOUNTER — Ambulatory Visit (INDEPENDENT_AMBULATORY_CARE_PROVIDER_SITE_OTHER): Payer: Medicare Other

## 2016-07-24 DIAGNOSIS — E119 Type 2 diabetes mellitus without complications: Secondary | ICD-10-CM | POA: Diagnosis not present

## 2016-07-24 DIAGNOSIS — S42202A Unspecified fracture of upper end of left humerus, initial encounter for closed fracture: Secondary | ICD-10-CM | POA: Diagnosis not present

## 2016-07-24 DIAGNOSIS — C9510 Chronic leukemia of unspecified cell type not having achieved remission: Secondary | ICD-10-CM | POA: Diagnosis not present

## 2016-07-24 DIAGNOSIS — I10 Essential (primary) hypertension: Secondary | ICD-10-CM | POA: Diagnosis not present

## 2016-07-24 DIAGNOSIS — M79602 Pain in left arm: Secondary | ICD-10-CM | POA: Diagnosis not present

## 2016-07-24 DIAGNOSIS — M6281 Muscle weakness (generalized): Secondary | ICD-10-CM | POA: Diagnosis not present

## 2016-07-24 DIAGNOSIS — S42202D Unspecified fracture of upper end of left humerus, subsequent encounter for fracture with routine healing: Secondary | ICD-10-CM | POA: Diagnosis not present

## 2016-07-24 NOTE — Progress Notes (Signed)
Office Visit Note   Patient: Stephanie Harris           Date of Birth: 09-01-1930           MRN: [id number] Visit Date: 07/24/2016              Requested by: [name], MD [address] [place], [place] [address] PCP: [name], MD   Assessment & Plan: Visit Diagnoses:  1. Closed traumatic displaced fracture of proximal end of left humerus, initial encounter     Plan: Begin pendulum exercises daily with home health PT and gentle range of motion. Sling at all times. Follow up in 3 weeks with repeat 2 view x-rays of the left shoulder.  Follow-Up Instructions: No Follow-up on file.   Orders:  Orders Placed This Encounter  Procedures  . XR Shoulder Left   No orders of the defined types were placed in this encounter.     Procedures: No procedures performed   Clinical Data: No additional findings.   Subjective: Chief Complaint  Patient presents with  . Left Shoulder - Pain, Fracture    Patient is a 82 year old female who sustained a mechanical fall 3 weeks ago sustained a left proximal humerus fracture she follows up today from the ER. She has 10 out of 10 pain and he cannot get comfortable. She has been to the ER multiple times. Pain does not radiate and is worse with movement.    Review of Systems  Constitutional: Negative.   HENT: Negative.   Eyes: Negative.   Respiratory: Negative.   Cardiovascular: Negative.   Endocrine: Negative.   Musculoskeletal: Negative.   Neurological: Negative.   Hematological: Negative.   Psychiatric/Behavioral: Negative.   All other systems reviewed and are negative.    Objective: Vital Signs: There were no vitals taken for this visit.  Physical Exam  Constitutional: She is oriented to person, place, and time. She appears well-developed and well-nourished.  HENT:  Head: Atraumatic.  Eyes: EOM are normal.  Neck: Neck supple.  Cardiovascular: Intact distal pulses.   Pulmonary/Chest: Effort normal.  Abdominal: Soft.    Neurological: She is alert and oriented to person, place, and time.  Skin: Skin is warm. Capillary refill takes less than 2 seconds.  Psychiatric: She has a normal mood and affect. Her behavior is normal. Judgment and thought content normal.  Nursing note and vitals reviewed.   Ortho Exam Exam of the left shoulder shows no significant bruising or swelling. She has discomfort with gentle range of motion of the shoulder. She is neurovascularly intact. Axillary nerve is intact. Specialty Comments:  No specialty comments available.  Imaging: Xr Shoulder Left  Result Date: 07/24/2016 Stable left proximal humerus fracture without evidence of dislocation.    PMFS History: Patient Active Problem List   Diagnosis Date Noted  . Acute cystitis 04/28/2016  . Chest pain 03/14/2015  . TIA (transient ischemic attack) 01/29/2015  . Allergic arthritis, hand    Past Medical History:  Diagnosis Date  . CML (chronic myelocytic leukemia) ([place])    Onc at [facility]  . Heart disease   . Hypertension   . Leukemia ([place])   . RA (rheumatoid arthritis) ([place])   . Stroke ([place])   . Thalassemia     Family History  Problem Relation Age of Onset  . Acute myelogenous leukemia Brother   . Stroke Mother   . Heart attack Father     Past Surgical History:  Procedure Laterality Date  .  ABDOMINAL HYSTERECTOMY    . CAROTID ENDARTERECTOMY Left   . CATARACT EXTRACTION    . CHOLECYSTECTOMY    . PERCUTANEOUS CORONARY STENT INTERVENTION (PCI-S)    . ROTATOR CUFF REPAIR Left   . TOTAL KNEE ARTHROPLASTY Right    Social History   Occupational History  . Not on file.   Social History Main Topics  . Smoking status: Never Smoker  . Smokeless tobacco: Never Used  . Alcohol use No  . Drug use: No  . Sexual activity: Not on file

## 2016-07-27 DIAGNOSIS — M6281 Muscle weakness (generalized): Secondary | ICD-10-CM | POA: Diagnosis not present

## 2016-07-27 DIAGNOSIS — S42202D Unspecified fracture of upper end of left humerus, subsequent encounter for fracture with routine healing: Secondary | ICD-10-CM | POA: Diagnosis not present

## 2016-07-27 DIAGNOSIS — M79602 Pain in left arm: Secondary | ICD-10-CM | POA: Diagnosis not present

## 2016-07-27 DIAGNOSIS — C9510 Chronic leukemia of unspecified cell type not having achieved remission: Secondary | ICD-10-CM | POA: Diagnosis not present

## 2016-07-27 DIAGNOSIS — I10 Essential (primary) hypertension: Secondary | ICD-10-CM | POA: Diagnosis not present

## 2016-07-27 DIAGNOSIS — E119 Type 2 diabetes mellitus without complications: Secondary | ICD-10-CM | POA: Diagnosis not present

## 2016-07-31 DIAGNOSIS — I10 Essential (primary) hypertension: Secondary | ICD-10-CM | POA: Diagnosis not present

## 2016-07-31 DIAGNOSIS — E119 Type 2 diabetes mellitus without complications: Secondary | ICD-10-CM | POA: Diagnosis not present

## 2016-07-31 DIAGNOSIS — M79602 Pain in left arm: Secondary | ICD-10-CM | POA: Diagnosis not present

## 2016-07-31 DIAGNOSIS — C9510 Chronic leukemia of unspecified cell type not having achieved remission: Secondary | ICD-10-CM | POA: Diagnosis not present

## 2016-07-31 DIAGNOSIS — D631 Anemia in chronic kidney disease: Secondary | ICD-10-CM | POA: Diagnosis not present

## 2016-07-31 DIAGNOSIS — Z79899 Other long term (current) drug therapy: Secondary | ICD-10-CM | POA: Diagnosis not present

## 2016-07-31 DIAGNOSIS — S42202D Unspecified fracture of upper end of left humerus, subsequent encounter for fracture with routine healing: Secondary | ICD-10-CM | POA: Diagnosis not present

## 2016-07-31 DIAGNOSIS — N390 Urinary tract infection, site not specified: Secondary | ICD-10-CM | POA: Diagnosis not present

## 2016-07-31 DIAGNOSIS — M6281 Muscle weakness (generalized): Secondary | ICD-10-CM | POA: Diagnosis not present

## 2016-07-31 DIAGNOSIS — N189 Chronic kidney disease, unspecified: Secondary | ICD-10-CM | POA: Diagnosis not present

## 2016-08-04 DIAGNOSIS — M79622 Pain in left upper arm: Secondary | ICD-10-CM | POA: Diagnosis not present

## 2016-08-04 DIAGNOSIS — R310 Gross hematuria: Secondary | ICD-10-CM | POA: Diagnosis not present

## 2016-08-04 DIAGNOSIS — I10 Essential (primary) hypertension: Secondary | ICD-10-CM | POA: Diagnosis not present

## 2016-08-04 DIAGNOSIS — M79602 Pain in left arm: Secondary | ICD-10-CM | POA: Diagnosis not present

## 2016-08-04 DIAGNOSIS — E119 Type 2 diabetes mellitus without complications: Secondary | ICD-10-CM | POA: Diagnosis not present

## 2016-08-04 DIAGNOSIS — S42202D Unspecified fracture of upper end of left humerus, subsequent encounter for fracture with routine healing: Secondary | ICD-10-CM | POA: Diagnosis not present

## 2016-08-04 DIAGNOSIS — C9510 Chronic leukemia of unspecified cell type not having achieved remission: Secondary | ICD-10-CM | POA: Diagnosis not present

## 2016-08-04 DIAGNOSIS — D519 Vitamin B12 deficiency anemia, unspecified: Secondary | ICD-10-CM | POA: Diagnosis not present

## 2016-08-04 DIAGNOSIS — M6281 Muscle weakness (generalized): Secondary | ICD-10-CM | POA: Diagnosis not present

## 2016-08-04 DIAGNOSIS — E039 Hypothyroidism, unspecified: Secondary | ICD-10-CM | POA: Diagnosis not present

## 2016-08-05 DIAGNOSIS — M6281 Muscle weakness (generalized): Secondary | ICD-10-CM | POA: Diagnosis not present

## 2016-08-05 DIAGNOSIS — E119 Type 2 diabetes mellitus without complications: Secondary | ICD-10-CM | POA: Diagnosis not present

## 2016-08-05 DIAGNOSIS — M79602 Pain in left arm: Secondary | ICD-10-CM | POA: Diagnosis not present

## 2016-08-05 DIAGNOSIS — I10 Essential (primary) hypertension: Secondary | ICD-10-CM | POA: Diagnosis not present

## 2016-08-05 DIAGNOSIS — S42202D Unspecified fracture of upper end of left humerus, subsequent encounter for fracture with routine healing: Secondary | ICD-10-CM | POA: Diagnosis not present

## 2016-08-05 DIAGNOSIS — C9510 Chronic leukemia of unspecified cell type not having achieved remission: Secondary | ICD-10-CM | POA: Diagnosis not present

## 2016-08-06 DIAGNOSIS — Z466 Encounter for fitting and adjustment of urinary device: Secondary | ICD-10-CM | POA: Diagnosis not present

## 2016-08-06 DIAGNOSIS — N390 Urinary tract infection, site not specified: Secondary | ICD-10-CM | POA: Diagnosis not present

## 2016-08-06 DIAGNOSIS — R339 Retention of urine, unspecified: Secondary | ICD-10-CM | POA: Diagnosis not present

## 2016-08-07 DIAGNOSIS — C9510 Chronic leukemia of unspecified cell type not having achieved remission: Secondary | ICD-10-CM | POA: Diagnosis not present

## 2016-08-07 DIAGNOSIS — E119 Type 2 diabetes mellitus without complications: Secondary | ICD-10-CM | POA: Diagnosis not present

## 2016-08-07 DIAGNOSIS — M6281 Muscle weakness (generalized): Secondary | ICD-10-CM | POA: Diagnosis not present

## 2016-08-07 DIAGNOSIS — S42202D Unspecified fracture of upper end of left humerus, subsequent encounter for fracture with routine healing: Secondary | ICD-10-CM | POA: Diagnosis not present

## 2016-08-07 DIAGNOSIS — M79602 Pain in left arm: Secondary | ICD-10-CM | POA: Diagnosis not present

## 2016-08-07 DIAGNOSIS — I10 Essential (primary) hypertension: Secondary | ICD-10-CM | POA: Diagnosis not present

## 2016-08-12 DIAGNOSIS — M6281 Muscle weakness (generalized): Secondary | ICD-10-CM | POA: Diagnosis not present

## 2016-08-12 DIAGNOSIS — C9510 Chronic leukemia of unspecified cell type not having achieved remission: Secondary | ICD-10-CM | POA: Diagnosis not present

## 2016-08-12 DIAGNOSIS — M79602 Pain in left arm: Secondary | ICD-10-CM | POA: Diagnosis not present

## 2016-08-12 DIAGNOSIS — S42202D Unspecified fracture of upper end of left humerus, subsequent encounter for fracture with routine healing: Secondary | ICD-10-CM | POA: Diagnosis not present

## 2016-08-12 DIAGNOSIS — I10 Essential (primary) hypertension: Secondary | ICD-10-CM | POA: Diagnosis not present

## 2016-08-12 DIAGNOSIS — E119 Type 2 diabetes mellitus without complications: Secondary | ICD-10-CM | POA: Diagnosis not present

## 2016-08-13 DIAGNOSIS — D638 Anemia in other chronic diseases classified elsewhere: Secondary | ICD-10-CM | POA: Diagnosis not present

## 2016-08-13 DIAGNOSIS — C91Z0 Other lymphoid leukemia not having achieved remission: Secondary | ICD-10-CM | POA: Diagnosis not present

## 2016-08-13 DIAGNOSIS — Z79899 Other long term (current) drug therapy: Secondary | ICD-10-CM | POA: Diagnosis not present

## 2016-08-14 ENCOUNTER — Ambulatory Visit (INDEPENDENT_AMBULATORY_CARE_PROVIDER_SITE_OTHER): Payer: Medicare Other | Admitting: Orthopaedic Surgery

## 2016-08-21 DIAGNOSIS — S42202D Unspecified fracture of upper end of left humerus, subsequent encounter for fracture with routine healing: Secondary | ICD-10-CM | POA: Diagnosis not present

## 2016-08-21 DIAGNOSIS — C9510 Chronic leukemia of unspecified cell type not having achieved remission: Secondary | ICD-10-CM | POA: Diagnosis not present

## 2016-08-21 DIAGNOSIS — E119 Type 2 diabetes mellitus without complications: Secondary | ICD-10-CM | POA: Diagnosis not present

## 2016-08-21 DIAGNOSIS — M79602 Pain in left arm: Secondary | ICD-10-CM | POA: Diagnosis not present

## 2016-08-21 DIAGNOSIS — I10 Essential (primary) hypertension: Secondary | ICD-10-CM | POA: Diagnosis not present

## 2016-08-21 DIAGNOSIS — M6281 Muscle weakness (generalized): Secondary | ICD-10-CM | POA: Diagnosis not present

## 2016-08-26 DIAGNOSIS — M6281 Muscle weakness (generalized): Secondary | ICD-10-CM | POA: Diagnosis not present

## 2016-08-26 DIAGNOSIS — E119 Type 2 diabetes mellitus without complications: Secondary | ICD-10-CM | POA: Diagnosis not present

## 2016-08-26 DIAGNOSIS — M79602 Pain in left arm: Secondary | ICD-10-CM | POA: Diagnosis not present

## 2016-08-26 DIAGNOSIS — S42202D Unspecified fracture of upper end of left humerus, subsequent encounter for fracture with routine healing: Secondary | ICD-10-CM | POA: Diagnosis not present

## 2016-08-26 DIAGNOSIS — C9510 Chronic leukemia of unspecified cell type not having achieved remission: Secondary | ICD-10-CM | POA: Diagnosis not present

## 2016-08-26 DIAGNOSIS — I10 Essential (primary) hypertension: Secondary | ICD-10-CM | POA: Diagnosis not present

## 2016-08-28 ENCOUNTER — Encounter (INDEPENDENT_AMBULATORY_CARE_PROVIDER_SITE_OTHER): Payer: Self-pay

## 2016-08-28 ENCOUNTER — Ambulatory Visit (INDEPENDENT_AMBULATORY_CARE_PROVIDER_SITE_OTHER): Payer: Medicare Other | Admitting: Orthopaedic Surgery

## 2016-08-28 ENCOUNTER — Encounter (INDEPENDENT_AMBULATORY_CARE_PROVIDER_SITE_OTHER): Payer: Self-pay | Admitting: Orthopaedic Surgery

## 2016-08-28 ENCOUNTER — Ambulatory Visit (INDEPENDENT_AMBULATORY_CARE_PROVIDER_SITE_OTHER): Payer: Medicare Other

## 2016-08-28 DIAGNOSIS — S42202D Unspecified fracture of upper end of left humerus, subsequent encounter for fracture with routine healing: Secondary | ICD-10-CM | POA: Diagnosis not present

## 2016-08-28 NOTE — Progress Notes (Signed)
Patient follows up for her left proximal humerus fracture. She is doing a little better. On physical exam she is able to move her arm up to the level of the shoulder quite well without significant pain. X-rays show a healed proximal humerus fracture. At this point will discontinue her sling. Continue with physical therapy for strengthening and range of motion. I'll see her back as needed.

## 2016-08-31 DIAGNOSIS — R339 Retention of urine, unspecified: Secondary | ICD-10-CM | POA: Diagnosis not present

## 2016-08-31 DIAGNOSIS — D631 Anemia in chronic kidney disease: Secondary | ICD-10-CM | POA: Diagnosis not present

## 2016-08-31 DIAGNOSIS — C91Z0 Other lymphoid leukemia not having achieved remission: Secondary | ICD-10-CM | POA: Diagnosis not present

## 2016-08-31 DIAGNOSIS — D638 Anemia in other chronic diseases classified elsewhere: Secondary | ICD-10-CM | POA: Diagnosis not present

## 2016-08-31 DIAGNOSIS — R300 Dysuria: Secondary | ICD-10-CM | POA: Diagnosis not present

## 2016-08-31 DIAGNOSIS — N189 Chronic kidney disease, unspecified: Secondary | ICD-10-CM | POA: Diagnosis not present

## 2016-09-02 DIAGNOSIS — I10 Essential (primary) hypertension: Secondary | ICD-10-CM | POA: Diagnosis not present

## 2016-09-02 DIAGNOSIS — C9510 Chronic leukemia of unspecified cell type not having achieved remission: Secondary | ICD-10-CM | POA: Diagnosis not present

## 2016-09-02 DIAGNOSIS — E119 Type 2 diabetes mellitus without complications: Secondary | ICD-10-CM | POA: Diagnosis not present

## 2016-09-02 DIAGNOSIS — S42202D Unspecified fracture of upper end of left humerus, subsequent encounter for fracture with routine healing: Secondary | ICD-10-CM | POA: Diagnosis not present

## 2016-09-02 DIAGNOSIS — M6281 Muscle weakness (generalized): Secondary | ICD-10-CM | POA: Diagnosis not present

## 2016-09-02 DIAGNOSIS — M79602 Pain in left arm: Secondary | ICD-10-CM | POA: Diagnosis not present

## 2016-09-07 DIAGNOSIS — M79602 Pain in left arm: Secondary | ICD-10-CM | POA: Diagnosis not present

## 2016-09-07 DIAGNOSIS — I10 Essential (primary) hypertension: Secondary | ICD-10-CM | POA: Diagnosis not present

## 2016-09-07 DIAGNOSIS — E119 Type 2 diabetes mellitus without complications: Secondary | ICD-10-CM | POA: Diagnosis not present

## 2016-09-07 DIAGNOSIS — C9510 Chronic leukemia of unspecified cell type not having achieved remission: Secondary | ICD-10-CM | POA: Diagnosis not present

## 2016-09-07 DIAGNOSIS — M6281 Muscle weakness (generalized): Secondary | ICD-10-CM | POA: Diagnosis not present

## 2016-09-07 DIAGNOSIS — S42202D Unspecified fracture of upper end of left humerus, subsequent encounter for fracture with routine healing: Secondary | ICD-10-CM | POA: Diagnosis not present

## 2016-09-11 DIAGNOSIS — S42202D Unspecified fracture of upper end of left humerus, subsequent encounter for fracture with routine healing: Secondary | ICD-10-CM | POA: Diagnosis not present

## 2016-09-11 DIAGNOSIS — I10 Essential (primary) hypertension: Secondary | ICD-10-CM | POA: Diagnosis not present

## 2016-09-11 DIAGNOSIS — C9510 Chronic leukemia of unspecified cell type not having achieved remission: Secondary | ICD-10-CM | POA: Diagnosis not present

## 2016-09-11 DIAGNOSIS — M6281 Muscle weakness (generalized): Secondary | ICD-10-CM | POA: Diagnosis not present

## 2016-09-11 DIAGNOSIS — M79602 Pain in left arm: Secondary | ICD-10-CM | POA: Diagnosis not present

## 2016-09-11 DIAGNOSIS — E119 Type 2 diabetes mellitus without complications: Secondary | ICD-10-CM | POA: Diagnosis not present

## 2016-09-14 DIAGNOSIS — N189 Chronic kidney disease, unspecified: Secondary | ICD-10-CM | POA: Diagnosis not present

## 2016-09-14 DIAGNOSIS — D631 Anemia in chronic kidney disease: Secondary | ICD-10-CM | POA: Diagnosis not present

## 2016-09-14 DIAGNOSIS — C91Z0 Other lymphoid leukemia not having achieved remission: Secondary | ICD-10-CM | POA: Diagnosis not present

## 2016-09-14 DIAGNOSIS — D638 Anemia in other chronic diseases classified elsewhere: Secondary | ICD-10-CM | POA: Diagnosis not present

## 2016-09-14 DIAGNOSIS — Z79899 Other long term (current) drug therapy: Secondary | ICD-10-CM | POA: Diagnosis not present

## 2016-09-14 DIAGNOSIS — N390 Urinary tract infection, site not specified: Secondary | ICD-10-CM | POA: Diagnosis not present

## 2016-09-17 DIAGNOSIS — D8989 Other specified disorders involving the immune mechanism, not elsewhere classified: Secondary | ICD-10-CM | POA: Diagnosis not present

## 2016-09-17 DIAGNOSIS — D638 Anemia in other chronic diseases classified elsewhere: Secondary | ICD-10-CM | POA: Diagnosis not present

## 2016-09-17 DIAGNOSIS — C91Z0 Other lymphoid leukemia not having achieved remission: Secondary | ICD-10-CM | POA: Diagnosis not present

## 2016-09-24 DIAGNOSIS — I129 Hypertensive chronic kidney disease with stage 1 through stage 4 chronic kidney disease, or unspecified chronic kidney disease: Secondary | ICD-10-CM | POA: Diagnosis not present

## 2016-09-24 DIAGNOSIS — D638 Anemia in other chronic diseases classified elsewhere: Secondary | ICD-10-CM | POA: Diagnosis not present

## 2016-09-24 DIAGNOSIS — C91Z0 Other lymphoid leukemia not having achieved remission: Secondary | ICD-10-CM | POA: Diagnosis not present

## 2016-09-24 DIAGNOSIS — Z87440 Personal history of urinary (tract) infections: Secondary | ICD-10-CM | POA: Diagnosis not present

## 2016-09-24 DIAGNOSIS — N189 Chronic kidney disease, unspecified: Secondary | ICD-10-CM | POA: Diagnosis not present

## 2016-09-24 DIAGNOSIS — D631 Anemia in chronic kidney disease: Secondary | ICD-10-CM | POA: Diagnosis not present

## 2016-10-08 DIAGNOSIS — R339 Retention of urine, unspecified: Secondary | ICD-10-CM | POA: Diagnosis not present

## 2016-10-08 DIAGNOSIS — D638 Anemia in other chronic diseases classified elsewhere: Secondary | ICD-10-CM | POA: Diagnosis not present

## 2016-10-22 DIAGNOSIS — C91Z0 Other lymphoid leukemia not having achieved remission: Secondary | ICD-10-CM | POA: Diagnosis not present

## 2016-10-22 DIAGNOSIS — D638 Anemia in other chronic diseases classified elsewhere: Secondary | ICD-10-CM | POA: Diagnosis not present

## 2016-11-05 DIAGNOSIS — D638 Anemia in other chronic diseases classified elsewhere: Secondary | ICD-10-CM | POA: Diagnosis not present

## 2016-11-05 DIAGNOSIS — C91Z0 Other lymphoid leukemia not having achieved remission: Secondary | ICD-10-CM | POA: Diagnosis not present

## 2016-11-05 DIAGNOSIS — N189 Chronic kidney disease, unspecified: Secondary | ICD-10-CM | POA: Diagnosis not present

## 2016-11-05 DIAGNOSIS — D631 Anemia in chronic kidney disease: Secondary | ICD-10-CM | POA: Diagnosis not present

## 2016-11-19 DIAGNOSIS — C91Z0 Other lymphoid leukemia not having achieved remission: Secondary | ICD-10-CM | POA: Diagnosis not present

## 2016-11-19 DIAGNOSIS — D638 Anemia in other chronic diseases classified elsewhere: Secondary | ICD-10-CM | POA: Diagnosis not present

## 2016-11-29 ENCOUNTER — Emergency Department: Payer: Medicare Other

## 2016-11-29 ENCOUNTER — Emergency Department
Admission: EM | Admit: 2016-11-29 | Discharge: 2016-11-30 | Disposition: A | Discharge status: Home / Self Care | Payer: Medicare Other | Attending: Emergency Medicine | Admitting: Emergency Medicine

## 2016-11-29 ENCOUNTER — Encounter: Payer: Self-pay | Admitting: Emergency Medicine

## 2016-11-29 DIAGNOSIS — S0990XA Unspecified injury of head, initial encounter: Secondary | ICD-10-CM | POA: Diagnosis present

## 2016-11-29 DIAGNOSIS — Y929 Unspecified place or not applicable: Secondary | ICD-10-CM | POA: Diagnosis not present

## 2016-11-29 DIAGNOSIS — I10 Essential (primary) hypertension: Secondary | ICD-10-CM | POA: Insufficient documentation

## 2016-11-29 DIAGNOSIS — S0003XA Contusion of scalp, initial encounter: Secondary | ICD-10-CM | POA: Diagnosis not present

## 2016-11-29 DIAGNOSIS — Y9389 Activity, other specified: Secondary | ICD-10-CM | POA: Diagnosis not present

## 2016-11-29 DIAGNOSIS — Y999 Unspecified external cause status: Secondary | ICD-10-CM | POA: Insufficient documentation

## 2016-11-29 DIAGNOSIS — W1809XA Striking against other object with subsequent fall, initial encounter: Secondary | ICD-10-CM | POA: Insufficient documentation

## 2016-11-29 DIAGNOSIS — Z79899 Other long term (current) drug therapy: Secondary | ICD-10-CM | POA: Insufficient documentation

## 2016-11-29 DIAGNOSIS — Z7982 Long term (current) use of aspirin: Secondary | ICD-10-CM | POA: Diagnosis not present

## 2016-11-29 DIAGNOSIS — R51 Headache: Secondary | ICD-10-CM | POA: Diagnosis not present

## 2016-11-29 DIAGNOSIS — W19XXXA Unspecified fall, initial encounter: Secondary | ICD-10-CM

## 2016-11-29 NOTE — ED Triage Notes (Signed)
Patient presents to Emergency Department via [facility] with complaints of fall.  Pt reports putting on new pillowcases, turned to reach and fell "someway or another" hit the back of head on end table.  Pt denies LOC, not of blood thinners, leukemia pt at [facility] for the last 15 years.    Pt has "goose egg" on upper occipital without bleeding. Pt O&A x 4, 8/10 pain only in the head, denies further injury.

## 2016-11-29 NOTE — ED Notes (Signed)
Patient transported to CT 

## 2016-11-30 NOTE — Discharge Instructions (Signed)
1. You may take Tylenol as needed for discomfort. 2. Apply ice to affected area several times daily to reduce swelling. 3. Return to the ER for worsening symptoms, persistent vomiting, lethargy or other concerns.

## 2016-11-30 NOTE — ED Provider Notes (Signed)
[facility] Emergency Department Provider Note   ____________________________________________   First MD Initiated Contact with Patient 11/30/16 [phone]     (approximate)  I have reviewed the triage vital signs and the nursing notes.   HISTORY  Chief Complaint Fall    HPI Stephanie Harris is a 83 y.o. female brought to the ED from home via EMS with a chief complaint of mechanical fall. Patient lost her balance while putting on a pillow cases. Struck the back of her head against the nightstand. Denies LOC, dizziness, vision changes, nausea or vomiting. Patient does not take anticoagulation. Denies fever, chills, chest pain, shortness of breath, abdominal pain, diarrhea. Nothing makes her symptoms better or worse.   Past Medical History:  Diagnosis Date  . CML (chronic myelocytic leukemia) ([place])    Onc at [facility]  . Heart disease   . Hypertension   . Leukemia ([place])   . RA (rheumatoid arthritis) ([place])   . Stroke ([place])   . Thalassemia     Patient Active Problem List   Diagnosis Date Noted  . Closed fracture of proximal end of left humerus with routine healing 08/28/2016  . Acute cystitis 04/28/2016  . Chest pain 03/14/2015  . TIA (transient ischemic attack) 01/29/2015  . Allergic arthritis, hand     Past Surgical History:  Procedure Laterality Date  . ABDOMINAL HYSTERECTOMY    . CAROTID ENDARTERECTOMY Left   . CATARACT EXTRACTION    . CHOLECYSTECTOMY    . PERCUTANEOUS CORONARY STENT INTERVENTION (PCI-S)    . ROTATOR CUFF REPAIR Left   . TOTAL KNEE ARTHROPLASTY Right     Prior to Admission medications   Medication Sig Start Date End Date Taking? Authorizing Provider  ascorbic acid (VITAMIN C) 1000 MG tablet Take 1,000 mg by mouth daily.    Historical Provider, MD  aspirin 81 MG chewable tablet Chew by mouth daily.    Historical Provider, MD  aspirin EC 81 MG tablet Take 81 mg by mouth daily.    Historical Provider, MD  carvedilol (COREG) 3.125  MG tablet Take 6.25 mg by mouth 2 (two) times daily.     Historical Provider, MD  cholecalciferol (VITAMIN D) 1000 UNITS tablet Take 1,000 Units by mouth daily.    Historical Provider, MD  folic acid (FOLVITE) 1 MG tablet Take 1 mg by mouth daily.    Historical Provider, MD  HYDROcodone-acetaminophen (NORCO/VICODIN) 5-325 MG tablet Take 1 tablet by mouth every 4 (four) hours as needed for moderate pain. Patient not taking: Reported on 08/28/2016 07/08/16 07/08/17  [name], MD  levothyroxine (SYNTHROID, LEVOTHROID) 50 MCG tablet Take 50 mcg by mouth daily. On the opposite days from taking 46mcg.    Historical Provider, MD  ondansetron (ZOFRAN ODT) 4 MG disintegrating tablet Take 1 tablet (4 mg total) by mouth every 8 (eight) hours as needed for nausea or vomiting. Patient not taking: Reported on 08/28/2016 07/08/16   [name], MD  saccharomyces boulardii (FLORASTOR) 250 MG capsule Take 1 capsule (250 mg total) by mouth 2 (two) times daily. 01/30/15   [name], MD  vitamin E 400 UNIT capsule Take 400 Units by mouth daily.    Historical Provider, MD    Allergies Penicillins and Latex  Family History  Problem Relation Age of Onset  . Acute myelogenous leukemia Brother   . Stroke Mother   . Heart attack Father     Social History Social History  Substance Use Topics  . Smoking status:  Never Smoker  . Smokeless tobacco: Never Used  . Alcohol use No    Review of Systems  Constitutional: No fever/chills. Eyes: No visual changes. ENT: Positive for scalp hematoma. No sore throat. Cardiovascular: Denies chest pain. Respiratory: Denies shortness of breath. Gastrointestinal: No abdominal pain.  No nausea, no vomiting.  No diarrhea.  No constipation. Genitourinary: Negative for dysuria. Musculoskeletal: Negative for back pain. Skin: Negative for rash. Neurological: Negative for headaches, focal weakness or numbness.  10-point ROS otherwise  negative.  ____________________________________________   PHYSICAL EXAM:  VITAL SIGNS: ED Triage Vitals [11/29/16 2312]  Enc Vitals Group     BP (!) 150/78     Pulse Rate 70     Resp 18     Temp 98 F (36.7 C)     Temp Source Oral     SpO2 100 %     Weight 101 lb (45.8 kg)     Height 5' (1.524 m)     Head Circumference      Peak Flow      Pain Score 8     Pain Loc      Pain Edu?      Excl. in [place]?     Constitutional: Alert and oriented. Chronically ill appearing and in no acute distress. Eyes: Conjunctivae are normal. PERRL. EOMI. Head: Small occipital scalp hematoma with overlying abrasion. No breaks in the skin. No bleeding. Nose: No congestion/rhinnorhea. Mouth/Throat: Mucous membranes are moist.  Oropharynx non-erythematous. Neck: No stridor.  No cervical spine tenderness to palpation. Cardiovascular: Normal rate, regular rhythm. Grossly normal heart sounds.  Good peripheral circulation. Respiratory: Normal respiratory effort.  No retractions. Lungs CTAB. Gastrointestinal: Soft and nontender. No distention. No abdominal bruits. No CVA tenderness. Musculoskeletal: No lower extremity tenderness nor edema.  No joint effusions. Neurologic:  Normal speech and language. No gross focal neurologic deficits are appreciated. No gait instability. Skin:  Skin is warm, dry and intact. No rash noted. Psychiatric: Mood and affect are normal. Speech and behavior are normal.  ____________________________________________   LABS (all labs ordered are listed, but only abnormal results are displayed)  Labs Reviewed - No data to display ____________________________________________  EKG  None ____________________________________________  RADIOLOGY  CT head interpreted per Dr. Toney Reil: 1. No acute intracranial abnormality identified.  2. Midline parietal scalp soft tissue contusion. No displaced  calvarial fracture.  3. Stable moderate chronic microvascular ischemic changes  and mild  parenchymal volume loss of the brain.  4. Stable small bilateral frontal and bilateral cerebellar  hemisphere chronic infarctions.   ____________________________________________   PROCEDURES  Procedure(s) performed: None  Procedures  Critical Care performed: No  ____________________________________________   INITIAL IMPRESSION / ASSESSMENT AND PLAN / ED COURSE  Pertinent labs & imaging results that were available during my care of the patient were reviewed by me and considered in my medical decision making (see chart for details).  83 year old female who presents status post mechanical fall with scalp hematoma. CT scan is negative for intracranial hemorrhage. Strict return precautions given. Patient and spouse verbalize understanding and agree with plan of care.      ____________________________________________   FINAL CLINICAL IMPRESSION(S) / ED DIAGNOSES  Final diagnoses:  Fall, initial encounter  Contusion of scalp, initial encounter      NEW MEDICATIONS STARTED DURING THIS VISIT:  New Prescriptions   No medications on file     Note:  This document was prepared using Dragon voice recognition software and may include unintentional dictation errors.  Paulette Blanch, MD 11/30/16 [phone]

## 2016-12-03 DIAGNOSIS — N189 Chronic kidney disease, unspecified: Secondary | ICD-10-CM | POA: Diagnosis not present

## 2016-12-03 DIAGNOSIS — Z5181 Encounter for therapeutic drug level monitoring: Secondary | ICD-10-CM | POA: Diagnosis not present

## 2016-12-03 DIAGNOSIS — Z79899 Other long term (current) drug therapy: Secondary | ICD-10-CM | POA: Diagnosis not present

## 2016-12-03 DIAGNOSIS — C91Z0 Other lymphoid leukemia not having achieved remission: Secondary | ICD-10-CM | POA: Diagnosis not present

## 2016-12-03 DIAGNOSIS — D631 Anemia in chronic kidney disease: Secondary | ICD-10-CM | POA: Diagnosis not present

## 2016-12-03 DIAGNOSIS — N390 Urinary tract infection, site not specified: Secondary | ICD-10-CM | POA: Diagnosis not present

## 2016-12-03 DIAGNOSIS — D638 Anemia in other chronic diseases classified elsewhere: Secondary | ICD-10-CM | POA: Diagnosis not present

## 2016-12-08 ENCOUNTER — Emergency Department
Admission: EM | Admit: 2016-12-08 | Discharge: 2016-12-08 | Disposition: A | Discharge status: Home / Self Care | Payer: Medicare Other | Attending: Emergency Medicine | Admitting: Emergency Medicine

## 2016-12-08 DIAGNOSIS — N289 Disorder of kidney and ureter, unspecified: Secondary | ICD-10-CM | POA: Diagnosis not present

## 2016-12-08 DIAGNOSIS — I119 Hypertensive heart disease without heart failure: Secondary | ICD-10-CM | POA: Insufficient documentation

## 2016-12-08 DIAGNOSIS — R339 Retention of urine, unspecified: Secondary | ICD-10-CM | POA: Diagnosis present

## 2016-12-08 DIAGNOSIS — N3000 Acute cystitis without hematuria: Secondary | ICD-10-CM | POA: Insufficient documentation

## 2016-12-08 LAB — CBC
HCT: 32 % — ABNORMAL LOW (ref 35.0–47.0)
Hemoglobin: 10 g/dL — ABNORMAL LOW (ref 12.0–16.0)
MCH: 19.1 pg — ABNORMAL LOW (ref 26.0–34.0)
MCHC: 31.3 g/dL — ABNORMAL LOW (ref 32.0–36.0)
MCV: 60.9 fL — ABNORMAL LOW (ref 80.0–100.0)
Platelets: 134 10*3/uL — ABNORMAL LOW (ref 150–440)
RBC: 5.25 MIL/uL — ABNORMAL HIGH (ref 3.80–5.20)
RDW: 16.5 % — ABNORMAL HIGH (ref 11.5–14.5)
WBC: 9 10*3/uL (ref 3.6–11.0)

## 2016-12-08 LAB — URINALYSIS, COMPLETE (UACMP) WITH MICROSCOPIC
Bilirubin Urine: NEGATIVE
Glucose, UA: NEGATIVE mg/dL
Ketones, ur: NEGATIVE mg/dL
Nitrite: POSITIVE — AB
Protein, ur: 30 mg/dL — AB
Specific Gravity, Urine: 1.006 (ref 1.005–1.030)
pH: 6 (ref 5.0–8.0)

## 2016-12-08 LAB — BASIC METABOLIC PANEL
Anion gap: 9 (ref 5–15)
BUN: 15 mg/dL (ref 6–20)
CO2: 23 mmol/L (ref 22–32)
Calcium: 9.6 mg/dL (ref 8.9–10.3)
Chloride: 106 mmol/L (ref 101–111)
Creatinine, Ser: 1.52 mg/dL — ABNORMAL HIGH (ref 0.44–1.00)
GFR calc Af Amer: 35 mL/min — ABNORMAL LOW (ref >60)
GFR calc non Af Amer: 31 mL/min — ABNORMAL LOW (ref >60)
Glucose, Bld: 107 mg/dL — ABNORMAL HIGH (ref 65–99)
Potassium: 4.1 mmol/L (ref 3.5–5.1)
Sodium: 138 mmol/L (ref 135–145)

## 2016-12-08 MED ORDER — CIPROFLOXACIN IN D5W 400 MG/200ML IV SOLN
400.0000 mg | Freq: Once | INTRAVENOUS | Status: AC
  Administered 2016-12-08: 400 mg via INTRAVENOUS

## 2016-12-08 MED ORDER — SODIUM CHLORIDE 0.9 % IV BOLUS (SEPSIS)
1000.0000 mL | Freq: Once | INTRAVENOUS | Status: AC
Start: 2016-12-08 — End: 2016-12-08
  Administered 2016-12-08: 1000 mL via INTRAVENOUS

## 2016-12-08 MED ORDER — CIPROFLOXACIN IN D5W 400 MG/200ML IV SOLN
INTRAVENOUS | Status: AC
  Administered 2016-12-08: 400 mg via INTRAVENOUS
  Filled 2016-12-08: qty 200

## 2016-12-08 NOTE — Discharge Instructions (Signed)
Today you have a urinary tract infection, and your kidney function is slightly abnormal. Please continue the entire course of antibiotics that you were prescribed by your [name] urologist. Also drink plenty of fluid to stay well-hydrated and protect your kidneys.  Return to the emergency department if you develop severe pain, fever, lightheadedness or fainting, or any other symptoms concerning to you.

## 2016-12-08 NOTE — ED Provider Notes (Signed)
[facility] Emergency Department Provider Note  ____________________________________________  Time seen: Approximately 7:17 AM  I have reviewed the triage vital signs and the nursing notes.   HISTORY  Chief Complaint Urinary Retention    HPI Stephanie Harris is a 83 y.o. female strips CML followed at [facility], recurrent urinary retention and UTI, presenting with urinary retention. The patient reports that for the past year she has been frequently and intermittently treated for UTI. For several months, she required an indwelling Foley catheter for urinary retention, which was removed. Yesterday, she began to develop dysuria and urinary frequency, and her urologist at [facility] called in a prescription for 2 different oral antibiotics, which she was unable to pick up from the pharmacy. Since last night, she has had urinary retention. At this time, she reports "a few drops" coming out but feels that she is incompletely voiding. She has not had any fever, chills, nausea vomiting or diarrhea. She has mild suprapubic discomfort.  The patient describes that in November 2017, there was a discussion about bladder biopsy for concern about malignancy, but she was unable to undergo this due to her other chronic medical conditions which did not make her candidate for sedation or anesthesia.   Past Medical History:  Diagnosis Date  . CML (chronic myelocytic leukemia) ([place])    Onc at [facility]  . Heart disease   . Hypertension   . Leukemia ([place])   . RA (rheumatoid arthritis) ([place])   . Stroke ([place])   . Thalassemia     Patient Active Problem List   Diagnosis Date Noted  . Closed fracture of proximal end of left humerus with routine healing 08/28/2016  . Acute cystitis 04/28/2016  . Chest pain 03/14/2015  . TIA (transient ischemic attack) 01/29/2015  . Allergic arthritis, hand     Past Surgical History:  Procedure Laterality Date  . ABDOMINAL HYSTERECTOMY    . CAROTID  ENDARTERECTOMY Left   . CATARACT EXTRACTION    . CHOLECYSTECTOMY    . PERCUTANEOUS CORONARY STENT INTERVENTION (PCI-S)    . ROTATOR CUFF REPAIR Left   . TOTAL KNEE ARTHROPLASTY Right     Current Outpatient Rx  . Order #: [id number] Class: Historical Med  . Order #: [id number] Class: Historical Med  . Order #: [id number] Class: Historical Med  . Order #: [id number] Class: Historical Med  . Order #: [id number] Class: Historical Med  . Order #: [id number] Class: Historical Med  . Order #: [id number] Class: Print  . Order #: [id number] Class: Historical Med  . Order #: [id number] Class: Print  . Order #: [id number] Class: Normal  . Order #: [id number] Class: Historical Med    Allergies Penicillins and Latex  Family History  Problem Relation Age of Onset  . Acute myelogenous leukemia Brother   . Stroke Mother   . Heart attack Father     Social History Social History  Substance Use Topics  . Smoking status: Never Smoker  . Smokeless tobacco: Never Used  . Alcohol use No    Review of Systems Constitutional: No fever/chills. Eyes: No visual changes. ENT: No sore throat. No congestion or rhinorrhea. Cardiovascular: Denies chest pain. Denies palpitations. Respiratory: Denies shortness of breath.  No cough. Gastrointestinal: Positive suprapubic abdominal pain.  No nausea, no vomiting.  No diarrhea.  No constipation. Genitourinary: Positive for dysuria. Positive urinary retention. Musculoskeletal: Negative for back pain. Skin: Negative for rash. Neurological: Negative for headaches. No focal numbness, tingling or weakness.   10-point ROS otherwise negative.  ____________________________________________   PHYSICAL EXAM:  VITAL SIGNS: ED Triage Vitals  Enc Vitals Group     BP 12/08/16 0546 (!) 190/80     Pulse Rate 12/08/16 0546 90     Resp 12/08/16 0546 17     Temp 12/08/16 0546 98.4 F (36.9 C)     Temp Source 12/08/16 0546 Oral     SpO2 12/08/16 0546 100 %     Weight 12/08/16 0546 101  lb (45.8 kg)     Height --      Head Circumference --      Peak Flow --      Pain Score 12/08/16 0612 8     Pain Loc --      Pain Edu? --      Excl. in [place]? --     Constitutional: Alert and oriented. Chronically ill appearing and in no acute distress. Answers questions appropriately. Eyes: Conjunctivae are normal.  EOMI. No scleral icterus. Head: Atraumatic. Nose: No congestion/rhinnorhea. Mouth/Throat: Mucous membranes are dry.  Neck: No stridor.  Supple.   Cardiovascular: Normal rate, regular rhythm. No murmurs, rubs or gallops.  Respiratory: Normal respiratory effort.  No accessory muscle use or retractions. Lungs CTAB.  No wheezes, rales or ronchi. Gastrointestinal: Soft, and nondistended.  Palpable mass which is likely the urinary bladder to the umbilicus with overlying tenderness to palpation. No guarding or rebound.  No peritoneal signs. Musculoskeletal: No LE edema.  Neurologic:  A&Ox3.  Speech is clear.  Face and smile are symmetric.  EOMI.  Moves all extremities well. Skin:  Skin is warm, dry and intact. No rash noted. Psychiatric: Mood and affect are normal. Speech and behavior are normal.  Normal judgement.  ____________________________________________   LABS (all labs ordered are listed, but only abnormal results are displayed)  Labs Reviewed  URINALYSIS, COMPLETE (UACMP) WITH MICROSCOPIC - Abnormal; Notable for the following:       Result Value   Color, Urine YELLOW (*)    APPearance TURBID (*)    Hgb urine dipstick SMALL (*)    Protein, ur 30 (*)    Nitrite POSITIVE (*)    Leukocytes, UA LARGE (*)    Bacteria, UA MANY (*)    Squamous Epithelial / LPF 0-5 (*)    All other components within normal limits  CBC - Abnormal; Notable for the following:    RBC 5.25 (*)    Hemoglobin 10.0 (*)    HCT 32.0 (*)    MCV 60.9 (*)    MCH 19.1 (*)    MCHC 31.3 (*)    RDW 16.5 (*)    Platelets 134 (*)    All other components within normal limits  BASIC METABOLIC PANEL -  Abnormal; Notable for the following:    Glucose, Bld 107 (*)    Creatinine, Ser 1.52 (*)    GFR calc non Af Amer 31 (*)    GFR calc Af Amer 35 (*)    All other components within normal limits  URINE CULTURE   ____________________________________________  EKG  Not indicated ____________________________________________  RADIOLOGY  No results found.  ____________________________________________   PROCEDURES  Procedure(s) performed: None  Procedures  Critical Care performed: No ____________________________________________   INITIAL IMPRESSION / ASSESSMENT AND PLAN / ED COURSE  Pertinent labs & imaging results that were available during my care of the patient were reviewed by me and considered in my medical decision making (see chart for details).  83 y.o. female with a history of urinary retention, recurrent UTI, presenting with urinary  retention and dysuria. I am concerned about UTI, or if the patient has a bladder malignancy or mass, her retention may be mechanical. We will put a Foley catheter in place for post void residual, which will remain if she has more than 200 cc normal bladder. We'll get a UA; the patient's last urine culture from [name] enterobacter that had multiple resistances but was susceptible to Cipro and Cefepime.  Plan re-evaluation for final disposition.  ----------------------------------------- 8:01 AM on 12/08/2016 -----------------------------------------  The patient, according to the nurse, was unable to void but post void residual was only 100 cc. We have removed the Foley catheter, and we'll plan to treat her with a liter of intravenous fluid, with repeat post void residual. Based on her last urine culture from 12/03/16, have ordered IV ciprofloxacin for her symptoms.  ----------------------------------------- 11:28 AM on 12/08/2016 -----------------------------------------  The patient has been making urine and has several episodes of  incontinence. She is refusing repeat post void residual. As spoken with her urology team at [facility], Su Hoff PA, who states that the patient can come to clinic on Thursday where she will see both Amy and Dr. scales, her primary urologist. The patient understands that today she has a mild bump in her creatinine and needs to drink plenty of fluid and take the entire course of antibiotics that she has been prescribed by the [facility] urologist. Plan discharge at this time. She should return precautions.  ____________________________________________  FINAL CLINICAL IMPRESSION(S) / ED DIAGNOSES  Final diagnoses:  Acute cystitis without hematuria  Acute renal insufficiency         NEW MEDICATIONS STARTED DURING THIS VISIT:  New Prescriptions   No medications on file      Eula Listen, MD 12/08/16 1129

## 2016-12-08 NOTE — ED Notes (Signed)
Patient unable to void on commode.  16 fr foley catheter placed and drained cloudy yellow urine.  Post void residual 100 ml.  Dr. Mariea Clonts informed and catheter removed.

## 2016-12-08 NOTE — ED Notes (Signed)
Patient denies pain and is resting comfortably.  

## 2016-12-08 NOTE — ED Notes (Signed)
Incontinent moderate amount of urine.  Linen changed, skin care given.  Reports relief of pain to perineum from ice.  Ice removed.  Continue to monitor.

## 2016-12-08 NOTE — ED Notes (Signed)
Into patient's room to obtain straight cath post void residual, patient refused straight cath.  Patient incontinent moderate amount of urine.  Linen changed skin care given.  Dr. Mariea Clonts notified that patient refused straight cath for post void residual.

## 2016-12-08 NOTE — ED Triage Notes (Signed)
Pt reports she was diagnosed with UTI by PCP on Monday, overnight pt not able to urinate, presents to ED with urinary retention.

## 2016-12-08 NOTE — ED Notes (Signed)
Ice pack given to patient for perineum.

## 2016-12-11 LAB — URINE CULTURE: Culture: >=100000 — AB

## 2016-12-16 ENCOUNTER — Encounter: Payer: Self-pay | Admitting: Physical Therapy

## 2016-12-16 ENCOUNTER — Ambulatory Visit: Payer: Medicare Other | Attending: Internal Medicine | Admitting: Physical Therapy

## 2016-12-16 DIAGNOSIS — R262 Difficulty in walking, not elsewhere classified: Secondary | ICD-10-CM | POA: Insufficient documentation

## 2016-12-16 DIAGNOSIS — R531 Weakness: Secondary | ICD-10-CM | POA: Insufficient documentation

## 2016-12-16 NOTE — Therapy (Signed)
[place] MAIN [facility] SERVICES [address] [place], [place], [address] Phone: [phone]   Fax:  [phone]  Physical Therapy Evaluation  Patient Details  Name: Stephanie Harris MRN: [id number] Date of Birth: 12/14/1930 Referring Provider: [name]   Encounter Date: 12/16/2016    Past Medical History:  Diagnosis Date  . CML (chronic myelocytic leukemia) ([place])    Onc at [facility]  . Heart disease   . Hypertension   . Leukemia ([place])   . RA (rheumatoid arthritis) ([place])   . Stroke ([place])   . Thalassemia     Past Surgical History:  Procedure Laterality Date  . ABDOMINAL HYSTERECTOMY    . CAROTID ENDARTERECTOMY Left   . CATARACT EXTRACTION    . CHOLECYSTECTOMY    . PERCUTANEOUS CORONARY STENT INTERVENTION (PCI-S)    . ROTATOR CUFF REPAIR Left   . TOTAL KNEE ARTHROPLASTY Right     There were no vitals filed for this visit.       Subjective Assessment - 12/16/16 1610    Subjective Patient has been having falls and she fractured her left arm. She is having difficulty with standing and walking and lifting up her left arm.    Patient is accompained by: Family member   Pertinent History Patient fell down in Jul 04, 2016 and she fractured her left arm. She has been having home health nursing. She has lost a lot of weight in the last month. She is walking with a rollator but is not as active and she has difficulty with getting off her couch. She is getting weaker and is able to do less and less activity at home and less walking. She uses a rollator at home and does not walk outside of the home due to weakness and fatigue.    Limitations Standing;Walking   How long can you sit comfortably? unlimited   How long can you stand comfortably? less than 5 minutes   How long can you walk comfortably? short distances in her home with rollator   Patient Stated Goals to walk better and move around better   Currently in Pain? Yes   Pain Score 5    Pain  Location Arm   Pain Orientation Left   Pain Descriptors / Indicators Aching   Pain Onset More than a month ago   Pain Frequency Intermittent   Aggravating Factors  lifting it   Pain Relieving Factors heat or ice or no movement   Multiple Pain Sites No            [facility] PT Assessment - 12/16/16 1618      Assessment   Medical Diagnosis falls /balance   Referring Provider Clayborn Bigness M    Hand Dominance Right   Prior Therapy several months ago     Precautions   Precautions Fall     Restrictions   Weight Bearing Restrictions Yes   Other Position/Activity Restrictions --  LUE no lifting or WB     Balance Screen   Has the patient fallen in the past 6 months Yes   How many times? --  6   Has the patient had a decrease in activity level because of a fear of falling?  Yes   Is the patient reluctant to leave their home because of a fear of falling?  --  no     Transport planner Private residence   Living Arrangements Spouse/significant other   Available Help  at Discharge Family   Type of [place] entrance;Level entry   Home Layout One level   [place] - 4 wheels;Shower seat;Toilet riser     Prior Function   Level of Independence Requires assistive device for independence;Needs assistance with homemaking;Needs assistance with transfers   Leisure watch TV        PAIN: Patient has pain in her left arm with movement 5/10   POSTURE: WNL   PROM/AROM: Decreased AROM LUE flex and abd 65 deg limited due to pain  STRENGTH:  Graded on a 0-5 scale Muscle Group Left Right  Shoulder flex -3/5 4/5  Shoulder Abd -3/5 4/5  Shoulder Ext -3/5 4/5  Shoulder IR/ER -3/5 4/5  Elbow 3+/5 4/5  Wrist/hand 4/5 4/5  Hip Flex -3/5 -3/5  Hip Abd -3/5 -3/5  Hip Add NT NT  Hip Ext NT NT  Hip IR/ER 3/5 3/5  Knee Flex -4/5 -4/5  Knee Ext -4/5 -4/5  Ankle DF 4/5 4/5  Ankle PF 4/5 4/5   SENSATION: no reports of numbness and  tingling BLE and BUE   FUNCTIONAL MOBILITY: Patient needs CGA for sit to supine and supine to sit mobility, Patient needs CGA for sit to stand transfers with rollator   BALANCE: poor dynamic standing balance, poor static standing balance Patient is not able to stand with feet together, tandem stand or single leg stand She is not able to stand with horizontal head movements   GAIT: Patient ambulates with rollator and CGA for 700 feet with 2 rest periods, unsteady with deviation in path, poor step height   OUTCOME MEASURES: TEST Outcome Interpretation  5 times sit<>stand 55.42sec >79 yo, >15 sec indicates increased risk for falls  10 meter walk test    28.28             m/s <1.0 m/s indicates increased risk for falls; limited community ambulator  Timed up and Go     35.71            sec <14 sec indicates increased risk for falls  6 minute walk test  635              Feet 1000 feet is community ambulator            Treatment: LAQ and hip flex x 20 BLE No reports of pain, needs cues for correct technique                         PT Long Term Goals - 12/17/16 1029      PT LONG TERM GOAL #1   Title Patient will complete a TUG test in <12 seconds without an AD for independent mobility and decreased fall risk in 8 weeks.    Baseline 35.71 sec   Time 8   Period Weeks   Status New     PT LONG TERM GOAL #2   Title Patient (>74 years old) will complete 5x/sit to stand test in < 14.2 seconds indicating a decreased likelihood of a balance dysfunction and improved LE strength in 8 weeks.    Baseline 55.42 sec   Time 8   Period Weeks   Status New     PT LONG TERM GOAL #3   Title Patient will be independent with ambulation with LRAD on unlevel surfaces without LOB for decreased fall risk in 8 weeks   Baseline gait speed 28.28 m/sec and cGA  for ambulation with rollator    Time 8   Period Weeks   Status New     PT LONG TERM GOAL #4   Title Patient will be  independent with supine to sit and sit to supine bed mobiity in 8 weeks.   Baseline CGA   Time 8   Period Weeks   Status New     PT LONG TERM GOAL #5   Title Patient will increase six minute walk test distance to >1000 for progression to community ambulator and improve gait ability   Baseline 635 feet   Time 8   Period Weeks   Status New               Plan - 12-22-2016 1019    Clinical Impression Statement Patient is 83 year old female with history of falls and unsteady gait. She presents with weakness in LUE after a shoulder fracture and weakness in  BLE. She was  instructed in safe mobiity with CGA assist. Patient has deficits in strength -3/5 BLE hips , -4/5 BLE knees, decreased mobility with bed mobility with CGA,  gait with rollator and CGA, decreased static and dynamic standing balance is poor with outcome measuares that indicate a falls risk    Patient will benefit from  skilled PT to improve dynamic standing balance, strength, gait, decrease pain, and reach functional goals.    Rehab Potential Good   Clinical Impairments Affecting Rehab Potential This patient presents with 2 personal factors/ comorbidities current situation, falls, and 4  body elements including body structures and functions, activity limitations and or participation restrictions: decreased strength, decreased gait, decreased standing balance, decreased sit to stand transfers. Patient's condition is, evolving   PT Frequency 2x / week   PT Duration 8 weeks   PT Treatment/Interventions Ultrasound;Moist Heat;Electrical Stimulation;Gait training;Stair training;Functional mobility training;Neuromuscular re-education;Balance training;Therapeutic exercise;Therapeutic activities;Patient/family education   PT Next Visit Plan Strengthening and balance   PT Home Exercise Plan marching and LAQ   Consulted and Agree with Plan of Care Patient      Patient will benefit from skilled therapeutic intervention in order to  improve the following deficits and impairments:  Abnormal gait, Decreased balance, Decreased mobility, Decreased endurance, Difficulty walking, Decreased range of motion, Decreased coordination, Decreased knowledge of use of DME, Decreased safety awareness, Decreased strength, Pain  Visit Diagnosis: Difficulty walking  Weakness generalized      G-Codes - 2016/12/22 1027    Functional Assessment Tool Used (Outpatient Only) 5 x sit to stand, TUG, 10 MW, 6 MW   Functional Limitation Mobility: Walking and moving around   Mobility: Walking and Moving Around Current Status [phone]) At least 80 percent but less than 100 percent impaired, limited or restricted   Mobility: Walking and Moving Around Goal Status [phone]) At least 60 percent but less than 80 percent impaired, limited or restricted       Problem List Patient Active Problem List   Diagnosis Date Noted  . Closed fracture of proximal end of left humerus with routine healing 08/28/2016  . Acute cystitis 04/28/2016  . Chest pain 03/14/2015  . TIA (transient ischemic attack) 01/29/2015  . Allergic arthritis, hand   Alanson Puls, PT, DPT  Arelia Sneddon S Dec 22, 2016, 10:40 AM  [place] MAIN [facility] SERVICES [address] [place], [place], [address] Phone: [phone]   Fax:  [phone]  Name: Stephanie Harris MRN: [id number] Date of Birth: 1930-11-28

## 2016-12-17 DIAGNOSIS — D638 Anemia in other chronic diseases classified elsewhere: Secondary | ICD-10-CM | POA: Diagnosis not present

## 2016-12-17 DIAGNOSIS — R339 Retention of urine, unspecified: Secondary | ICD-10-CM | POA: Diagnosis not present

## 2016-12-17 DIAGNOSIS — N390 Urinary tract infection, site not specified: Secondary | ICD-10-CM | POA: Diagnosis not present

## 2016-12-21 ENCOUNTER — Ambulatory Visit: Payer: Medicare Other | Admitting: Physical Therapy

## 2016-12-21 ENCOUNTER — Ambulatory Visit: Payer: Medicare Other

## 2016-12-21 ENCOUNTER — Encounter: Payer: Self-pay | Admitting: Physical Therapy

## 2016-12-21 DIAGNOSIS — R531 Weakness: Secondary | ICD-10-CM

## 2016-12-21 DIAGNOSIS — R262 Difficulty in walking, not elsewhere classified: Secondary | ICD-10-CM | POA: Diagnosis not present

## 2016-12-21 NOTE — Therapy (Addendum)
[place] MAIN [facility] SERVICES [address] [place], [place], [address] Phone: [phone]   Fax:  [phone]  Physical Therapy Treatment  Patient Details  Name: Stephanie Harris MRN: [id number] Date of Birth: Feb 04, 1931 Referring Provider: [name]   Encounter Date: 12/21/2016    Past Medical History:  Diagnosis Date  . CML (chronic myelocytic leukemia) ([place])    Onc at [facility]  . Heart disease   . Hypertension   . Leukemia ([place])   . RA (rheumatoid arthritis) ([place])   . Stroke ([place])   . Thalassemia     Past Surgical History:  Procedure Laterality Date  . ABDOMINAL HYSTERECTOMY    . CAROTID ENDARTERECTOMY Left   . CATARACT EXTRACTION    . CHOLECYSTECTOMY    . PERCUTANEOUS CORONARY STENT INTERVENTION (PCI-S)    . ROTATOR CUFF REPAIR Left   . TOTAL KNEE ARTHROPLASTY Right     There were no vitals filed for this visit.      Subjective Assessment - 12/21/16 1442    Subjective Patient has been having falls and she fractured her left arm. She is having difficulty with standing and walking and lifting up her left arm.    Patient is accompained by: Family member   Pertinent History Patient fell down in Jul 04, 2016 and she fractured her left arm. She has been having home health nursing. She has lost a lot of weight in the last month. She is walking with a rollator but is not as active and she has difficulty with getting off her couch. She is getting weaker and is able to do less and less activity at home and less walking. She uses a rollator at home and does not walk outside of the home due to weakness and fatigue.    Limitations Standing;Walking   How long can you sit comfortably? unlimited   How long can you stand comfortably? less than 5 minutes   How long can you walk comfortably? short distances in her home with rollator   Currently in Pain? Yes   Pain Score 8    Pain Location Arm   Pain Orientation Left   Pain Descriptors /  Indicators Aching   Pain Type Chronic pain   Pain Onset More than a month ago   Pain Frequency Intermittent   Aggravating Factors  lifting   Pain Relieving Factors heat and ice or no movement   Multiple Pain Sites --  right knee 3/10     Treatment: Gait training with Rw and CGA for 500 feet x 3 with head turns for balance training and cues for posture correction.  Therapeutic exercise: 3 way hip without resistance x 15 x 2 BLE Heel raises x 15  Cues for posture correction No reports of pain                          PT Education - 12/21/16 1445    Education provided Yes   Education Details plan of care   Person(s) Educated Patient   Methods Explanation   Comprehension Verbalized understanding             PT Long Term Goals - 12/17/16 1029      PT LONG TERM GOAL #1   Title Patient will complete a TUG test in <12 seconds without an AD for independent mobility and decreased fall risk in 8 weeks.    Baseline 35.71 sec  Time 8   Period Weeks   Status New     PT LONG TERM GOAL #2   Title Patient (>41 years old) will complete 5x/sit to stand test in < 14.2 seconds indicating a decreased likelihood of a balance dysfunction and improved LE strength in 8 weeks.    Baseline 55.42 sec   Time 8   Period Weeks   Status New     PT LONG TERM GOAL #3   Title Patient will be independent with ambulation with LRAD on unlevel surfaces without LOB for decreased fall risk in 8 weeks   Baseline gait speed 28.28 m/sec and cGA for ambulation with rollator    Time 8   Period Weeks   Status New     PT LONG TERM GOAL #4   Title Patient will be independent with supine to sit and sit to supine bed mobiity in 8 weeks.   Baseline CGA   Time 8   Period Weeks   Status New     PT LONG TERM GOAL #5   Title Patient will increase six minute walk test distance to >1000 for progression to community ambulator and improve gait ability   Baseline 635 feet   Time 8    Period Weeks   Status New              Plan - 12/21/16 1446    Clinical Impression Statement Patient instructed in gait training on level surfaces for 500 feet x 3 with rest periods standing. She is able to perform beginning LE exercises with pain behaviors in left arm and right knee.  Patient will continue to benefit from skilled PT to improve strength, balance and gait. She continues to have LE weakness -3/5 hip extension and abduction and decreased static and dynamic standing balance.   Rehab Potential Good   Clinical Impairments Affecting Rehab Potential This patient presents with 2 personal factors/ comorbidities current situation, falls, and 4  body elements including body structures and functions, activity limitations and or participation restrictions: decreased strength, decreased gait, decreased standing balance, decreased sit to stand transfers. Patient's condition is, evolving   PT Frequency 2x / week   PT Duration 8 weeks   PT Treatment/Interventions Ultrasound;Moist Heat;Electrical Stimulation;Gait training;Stair training;Functional mobility training;Neuromuscular re-education;Balance training;Therapeutic exercise;Therapeutic activities;Patient/family education   PT Next Visit Plan Strengthening and balance   PT Home Exercise Plan marching and LAQ   Consulted and Agree with Plan of Care Patient      Patient will benefit from skilled therapeutic intervention in order to improve the following deficits and impairments:  Abnormal gait, Decreased balance, Decreased mobility, Decreased endurance, Difficulty walking, Decreased range of motion, Decreased coordination, Decreased knowledge of use of DME, Decreased safety awareness, Decreased strength, Pain  Visit Diagnosis: Difficulty walking  Weakness generalized     Problem List Patient Active Problem List   Diagnosis Date Noted  . Closed fracture of proximal end of left humerus with routine healing 08/28/2016  . Acute  cystitis 04/28/2016  . Chest pain 03/14/2015  . TIA (transient ischemic attack) 01/29/2015  . Allergic arthritis, hand    Alanson Puls, PT, DPT [place], [place] S 12/21/2016, 5:13 PM  [place] MAIN [facility] SERVICES [address] [place], [place], [address] Phone: [phone]   Fax:  [phone]  Name: Stephanie Harris MRN: [id number] Date of Birth: Feb 19, 1931

## 2016-12-23 ENCOUNTER — Encounter: Payer: Self-pay | Admitting: Physical Therapy

## 2016-12-23 ENCOUNTER — Ambulatory Visit: Payer: Medicare Other | Admitting: Occupational Therapy

## 2016-12-23 ENCOUNTER — Ambulatory Visit: Payer: Medicare Other

## 2016-12-23 ENCOUNTER — Encounter: Payer: Self-pay | Admitting: Occupational Therapy

## 2016-12-23 ENCOUNTER — Ambulatory Visit: Payer: Medicare Other | Attending: Orthopaedic Surgery | Admitting: Physical Therapy

## 2016-12-23 DIAGNOSIS — R278 Other lack of coordination: Secondary | ICD-10-CM | POA: Diagnosis not present

## 2016-12-23 DIAGNOSIS — R262 Difficulty in walking, not elsewhere classified: Secondary | ICD-10-CM | POA: Diagnosis not present

## 2016-12-23 DIAGNOSIS — R531 Weakness: Secondary | ICD-10-CM | POA: Diagnosis not present

## 2016-12-23 DIAGNOSIS — M6281 Muscle weakness (generalized): Secondary | ICD-10-CM | POA: Diagnosis not present

## 2016-12-23 NOTE — Therapy (Signed)
[place] MAIN [facility] SERVICES [address] [place], [place], [address] Phone: [phone]   Fax:  [phone]  Physical Therapy Treatment  Patient Details  Name: Stephanie Harris MRN: [id number] Date of Birth: 1931/01/08 Referring Provider: [name]   Encounter Date: 12/23/2016    Past Medical History:  Diagnosis Date  . CML (chronic myelocytic leukemia) ([place])    Onc at [facility]  . Heart disease   . Hypertension   . Leukemia ([place])   . RA (rheumatoid arthritis) ([place])   . Stroke ([place])   . Thalassemia     Past Surgical History:  Procedure Laterality Date  . ABDOMINAL HYSTERECTOMY    . CAROTID ENDARTERECTOMY Left   . CATARACT EXTRACTION    . CHOLECYSTECTOMY    . PERCUTANEOUS CORONARY STENT INTERVENTION (PCI-S)    . ROTATOR CUFF REPAIR Left   . TOTAL KNEE ARTHROPLASTY Right     There were no vitals filed for this visit.      Subjective Assessment - 12/23/16 1534    Subjective Patient has been having difficulty walking and lifting up her left arm.    Patient is accompained by: Family member   Pertinent History Patient fell down in Jul 04, 2016 and she fractured her left arm. She has been having home health nursing. She has lost a lot of weight in the last month. She is walking with a rollator but is not as active and she has difficulty with getting off her couch. She is getting weaker and is able to do less and less activity at home and less walking. She uses a rollator at home and does not walk outside of the home due to weakness and fatigue.    Limitations Standing;Walking   How long can you sit comfortably? unlimited   How long can you stand comfortably? less than 5 minutes   How long can you walk comfortably? short distances in her home with rollator   Patient Stated Goals to walk better and move around better   Currently in Pain? Yes   Pain Score 8    Pain Location Arm   Pain Orientation Left   Pain Descriptors / Indicators  Aching   Pain Type Chronic pain   Pain Onset More than a month ago   Pain Frequency Intermittent   Aggravating Factors  lifting   Pain Relieving Factors heat and ice and no movement   Multiple Pain Sites No       Treatment: Gait training with rollator and CGA for 500 feet x 3 with head turns for balance training and cues for posture correction.  Therapeutic exercise: Leg press x 75 lbs x 20 x 2 Heel raises x 15 Hip abd x 10 standing BLE  Hip flex x 10 standing BLE   Cues for posture correction on all exercises above No reports of increased  Pain, but patient has left UE pain throughout treatment.                           PT Education - 12/23/16 1536    Education provided Yes   Education Details walking with correct posutre   Person(s) Educated Patient   Methods Explanation   Comprehension Verbalized understanding             PT Long Term Goals - 12/17/16 1029      PT LONG TERM GOAL #1   Title Patient will complete a  TUG test in <12 seconds without an AD for independent mobility and decreased fall risk in 8 weeks.    Baseline 35.71 sec   Time 8   Period Weeks   Status New     PT LONG TERM GOAL #2   Title Patient (>39 years old) will complete 5x/sit to stand test in < 14.2 seconds indicating a decreased likelihood of a balance dysfunction and improved LE strength in 8 weeks.    Baseline 55.42 sec   Time 8   Period Weeks   Status New     PT LONG TERM GOAL #3   Title Patient will be independent with ambulation with LRAD on unlevel surfaces without LOB for decreased fall risk in 8 weeks   Baseline gait speed 28.28 m/sec and cGA for ambulation with rollator    Time 8   Period Weeks   Status New     PT LONG TERM GOAL #4   Title Patient will be independent with supine to sit and sit to supine bed mobiity in 8 weeks.   Baseline CGA   Time 8   Period Weeks   Status New     PT LONG TERM GOAL #5   Title Patient will increase six minute  walk test distance to >1000 for progression to community ambulator and improve gait ability   Baseline 635 feet   Time 8   Period Weeks   Status New               Plan - 12/23/16 1536    Clinical Impression Statement Patient instructed in gait training on level surfaces for 1500 feet x 3 with rest periods standing. She is able to perform beginning LE exercises with pain behaviors in left arm and right knee. Patient will continue to benefit from skilled PT to improve strength, balance and gait. She continues to have LE weakness -3/5 hip extension and abduction and decreased static and dynamic standing balance.She will continue to benefit from skilled PT to improve strength and balance.   Rehab Potential Good   Clinical Impairments Affecting Rehab Potential This patient presents with 2 personal factors/ comorbidities current situation, falls, and 4  body elements including body structures and functions, activity limitations and or participation restrictions: decreased strength, decreased gait, decreased standing balance, decreased sit to stand transfers. Patient's condition is, evolving   PT Frequency 2x / week   PT Duration 8 weeks   PT Treatment/Interventions Ultrasound;Moist Heat;Electrical Stimulation;Gait training;Stair training;Functional mobility training;Neuromuscular re-education;Balance training;Therapeutic exercise;Therapeutic activities;Patient/family education   PT Next Visit Plan Strengthening and balance   PT Home Exercise Plan marching and LAQ   Consulted and Agree with Plan of Care Patient      Patient will benefit from skilled therapeutic intervention in order to improve the following deficits and impairments:  Abnormal gait, Decreased balance, Decreased mobility, Decreased endurance, Difficulty walking, Decreased range of motion, Decreased coordination, Decreased knowledge of use of DME, Decreased safety awareness, Decreased strength, Pain  Visit Diagnosis: Difficulty  walking  Weakness generalized     Problem List Patient Active Problem List   Diagnosis Date Noted  . Closed fracture of proximal end of left humerus with routine healing 08/28/2016  . Acute cystitis 04/28/2016  . Chest pain 03/14/2015  . TIA (transient ischemic attack) 01/29/2015  . Allergic arthritis, hand    Alanson Puls, PT, DPT [place], [place] S 12/23/2016, 4:03 PM  [place] MAIN [facility] SERVICES [place],  [place], [address] Phone: [phone]   Fax:  [phone]  Name: Stephanie Harris MRN: [id number] Date of Birth: 1930/11/19

## 2016-12-23 NOTE — Therapy (Signed)
[place] MAIN [facility] SERVICES [address] [place], [place], [address] Phone: [phone]   Fax:  [phone]  Occupational Therapy Evaluation  Patient Details  Name: Stephanie Harris MRN: [id number] Date of Birth: 1931-06-16 Referring Provider: Dr. Chancy Milroy  Encounter Date: 12/23/2016      OT End of Session - 12/23/16 1749    Visit Number 1   Number of Visits 24   Date for OT Re-Evaluation 03/17/17   Authorization Type Medicare G-Code 1 of 10   OT Start Time 1445   OT Stop Time 1530   OT Time Calculation (min) 45 min   Activity Tolerance Patient limited by pain   Behavior During Therapy [facility] for tasks assessed/performed      Past Medical History:  Diagnosis Date  . CML (chronic myelocytic leukemia) ([place])    Onc at [facility]  . Heart disease   . Hypertension   . Leukemia ([place])   . RA (rheumatoid arthritis) ([place])   . Stroke ([place])   . Thalassemia     Past Surgical History:  Procedure Laterality Date  . ABDOMINAL HYSTERECTOMY    . CAROTID ENDARTERECTOMY Left   . CATARACT EXTRACTION    . CHOLECYSTECTOMY    . PERCUTANEOUS CORONARY STENT INTERVENTION (PCI-S)    . ROTATOR CUFF REPAIR Left   . TOTAL KNEE ARTHROPLASTY Right     There were no vitals filed for this visit.      Subjective Assessment - 12/23/16 1737    Subjective  Pt. reports her husband just sits in the recliner most of time.   Pertinent History Pt. is an 83 y.o. female who sustained a Left Humerus Fracture during a fall on Nov. 11, 2017. Pt. has had multiple falls since that time. Pt. last fall was sustained 2 weeks ago. Pt. reports she fell on her left side, and had an x-ray of her head only. Pt. reports  increased pain with AROM s attempts since this most recent fall.   Pain Score 8    Pain Location Arm   Pain Orientation Left   Pain Descriptors / Indicators Aching   Pain Type Chronic pain   Pain Onset 1 to 4 weeks ago           [facility] OT Assessment - 12/23/16 1456       Assessment   Diagnosis Left Humerus Fracture   Referring Provider Dr. Chancy Milroy   Onset Date 07/04/16     Balance Screen   Has the patient fallen in the past 6 months Yes   How many times? 2   Has the patient had a decrease in activity level because of a fear of falling?  Yes   Is the patient reluctant to leave their home because of a fear of falling?  Yes     Home  Environment   Family/patient expects to be discharged to: Private residence   Living Arrangements Spouse/significant other  and son   Available Help at [place] entrance   [place] seat;Walker - 4 wheels;Bedside commode   Lives With Family     Prior Function   Level of Independence Independent   Vocation Retired   Leisure shopping     ADL   Eating/Feeding Independent   Grooming Independent   Scientist, clinical (histocompatibility and immunogenetics) Independent   Lower Body Bathing Independent   Upper Body Dressing Increased time   Lower Body Dressing Increased  time   Tax adviser Minimal assistance     IADL   Shopping Needs to be accompanied on any shopping trip   Light Housekeeping Needs help with all home maintenance tasks   Meal Prep Able to complete simple warm meal prep   Medication Management Is responsible for taking medication in correct dosages at correct time   Financial Management Manages financial matters independently (budgets, writes checks, pays rent, bills goes to bank), collects and keeps track of income     Written Expression   Dominant Hand Right     Vision - History   Baseline Vision Wears glasses all the time   Visual History Macular degeneration     Activity Tolerance   Activity Tolerance Tolerates < 10 min activity with changes in vital signs     Cognition   Overall Cognitive Status Within Functional Limits for tasks assessed     Sensation   Light Touch Appears Intact     AROM   Overall  AROM Comments Left shoulder flexion: 39(64), abduction: 43(58)                         OT Education - 12/23/16 1747    Education provided Yes   Education Details LUE ROM   Person(s) Educated Patient   Methods Explanation   Comprehension Verbalized understanding             OT Long Term Goals - 12/23/16 1804      OT LONG TERM GOAL #1   Title Pt. will independently be able to fold clothes   Baseline Pt. is unable   Time 12   Period Weeks   Status New     OT LONG TERM GOAL #2   Title Pt. will independently be able to put her clothes neatly into drawers.   Baseline Pt. has difficulty   Time 12   Period Weeks   Status New     OT LONG TERM GOAL #3   Title Pt. will independently be able to place clothes on hangers after being in the dryer.   Baseline Pt. has difficulty   Time 12   Period Weeks   Status New     OT LONG TERM GOAL #4   Title Pt. will perform light meal preparation.   Baseline Pt. has difficulty   Time 12   Period Weeks   Status New     OT LONG TERM GOAL #5   Title Pt. will demonstrate energy conservation/work simplification techniques during ADLs, and IADLs.   Baseline Pt. has difficulty   Time 12   Period Weeks   Status New               Plan - 12/23/16 1750    Clinical Impression Statement Pt. is an 83 y.o. female who sustained a left Humerus Fracture sustained in a fall on Nov. 11, 2017. Pt. has had multiple falls since that time. Pt.'s most recent fall occurred 2 weeks ago. At which time pt. landed on her left side. Pt. reports having had testing on her head. Pt. reports she did not have testing done to her left shoulder. Pt. reports increased pain with minimal movement for her Left shoulder since that time. Pt. reports having an Orthopedic Appointment scheduled next week. At this time, the plan is for the pt. to have her orthopedic appointment to assess her shoulder prior to returning for OT visits. Pt. could  benefit from  skilled OT services to improve ADL, and IADL functioning.   Rehab Potential Good   OT Frequency 2x / week   OT Duration 12 weeks   OT Treatment/Interventions Self-care/ADL training;Therapeutic exercise;Therapeutic exercises;Therapeutic activities;DME and/or AE instruction;Patient/family education;Functional Mobility Training;Energy conservation   Consulted and Agree with Plan of Care Patient      Patient will benefit from skilled therapeutic intervention in order to improve the following deficits and impairments:  Pain, Decreased range of motion, Impaired UE functional use, Decreased coordination, Decreased strength, Decreased safety awareness, Decreased balance, Decreased activity tolerance, Decreased endurance  Visit Diagnosis: Muscle weakness (generalized)  Other lack of coordination      G-Codes - 12/26/2016 1802    Functional Assessment Tool Used (Outpatient only) Clinical Judgement based on pt. current functional level   Functional Limitation Self care   Self Care Current Status (M0102) At least 20 percent but less than 40 percent impaired, limited or restricted   Self Care Goal Status (V2536) At least 1 percent but less than 20 percent impaired, limited or restricted      Problem List Patient Active Problem List   Diagnosis Date Noted  . Closed fracture of proximal end of left humerus with routine healing 08/28/2016  . Acute cystitis 04/28/2016  . Chest pain 03/14/2015  . TIA (transient ischemic attack) 01/29/2015  . Allergic arthritis, hand     Harrel Carina, MS, OTR/L Dec 26, 2016, 6:12 PM  [place] MAIN [facility] SERVICES [address] [place], [place], [address] Phone: [phone]   Fax:  [phone]  Name: Stephanie Harris MRN: [id number] Date of Birth: Aug 27, 1930

## 2016-12-28 ENCOUNTER — Ambulatory Visit: Payer: Medicare Other | Admitting: Physical Therapy

## 2016-12-28 ENCOUNTER — Ambulatory Visit: Payer: Medicare Other

## 2016-12-30 ENCOUNTER — Encounter: Payer: Self-pay | Admitting: Physical Therapy

## 2016-12-30 ENCOUNTER — Ambulatory Visit: Payer: Medicare Other | Admitting: Physical Therapy

## 2016-12-30 ENCOUNTER — Ambulatory Visit: Payer: Medicare Other

## 2016-12-30 ENCOUNTER — Ambulatory Visit: Payer: Medicare Other | Admitting: Occupational Therapy

## 2016-12-30 VITALS — BP 152/49

## 2016-12-30 DIAGNOSIS — M6281 Muscle weakness (generalized): Secondary | ICD-10-CM | POA: Diagnosis not present

## 2016-12-30 DIAGNOSIS — R262 Difficulty in walking, not elsewhere classified: Secondary | ICD-10-CM

## 2016-12-30 DIAGNOSIS — R278 Other lack of coordination: Secondary | ICD-10-CM | POA: Diagnosis not present

## 2016-12-30 DIAGNOSIS — R531 Weakness: Secondary | ICD-10-CM

## 2016-12-30 NOTE — Therapy (Signed)
[place] MAIN [facility] SERVICES [address] [place], [place], [address] Phone: [phone]   Fax:  [phone]  Physical Therapy Treatment  Patient Details  Name: GENIYAH EISCHEID MRN: [id number] Date of Birth: Oct 04, 1930 Referring Provider: [name]   Encounter Date: 12/30/2016      PT End of Session - 12/30/16 1434    Visit Number 4   Number of Visits 17   Date for PT Re-Evaluation 02-06-17   Authorization Type g codes   Authorization Time Period 4/10   PT Start Time 1431   PT Stop Time 1512   PT Time Calculation (min) 41 min   Equipment Utilized During Treatment Gait belt   Activity Tolerance Patient tolerated treatment well   Behavior During Therapy Anxious  anxious about pain with exercises      Past Medical History:  Diagnosis Date  . CML (chronic myelocytic leukemia) ([place])    Onc at [facility]  . Heart disease   . Hypertension   . Leukemia ([place])   . RA (rheumatoid arthritis) ([place])   . Stroke ([place])   . Thalassemia     Past Surgical History:  Procedure Laterality Date  . ABDOMINAL HYSTERECTOMY    . CAROTID ENDARTERECTOMY Left   . CATARACT EXTRACTION    . CHOLECYSTECTOMY    . PERCUTANEOUS CORONARY STENT INTERVENTION (PCI-S)    . ROTATOR CUFF REPAIR Left   . TOTAL KNEE ARTHROPLASTY Right     Vitals:   12/30/16 1439  BP: (!) 152/49        Subjective Assessment - 12/30/16 1439    Subjective Pt reports she is tired today.  She presents to therapy in heels, limiting session to seated and supine exercises.  Asked pt to wear more appropriate closed toed shoes to next session, pt verbalized understanding.  Pt denies any additional falls since her fall 2 weeks ago.  She continues to report increased L shoulder pain and has an appointment with [name] tomorrow for this pain.  She reports she has not been doing her HEP due to her pain.     Patient is accompained by: Family member   Pertinent History Patient fell down in Jul 04, 2016 and she fractured her left arm. She has been having home health nursing. She has lost a lot of weight in the last month. She is walking with a rollator but is not as active and she has difficulty with getting off her couch. She is getting weaker and is able to do less and less activity at home and less walking. She uses a rollator at home and does not walk outside of the home due to weakness and fatigue.    Limitations Standing;Walking   How long can you sit comfortably? unlimited   How long can you stand comfortably? less than 5 minutes   How long can you walk comfortably? short distances in her home with rollator   Patient Stated Goals to walk better and move around better   Currently in Pain? Yes   Pain Score 8    Pain Location Shoulder   Pain Orientation Left   Pain Descriptors / Indicators Aching   Pain Type --  another fall 2 weeks ago   Pain Onset 1 to 4 weeks ago   Pain Frequency Constant   Multiple Pain Sites No       Therapeutic Exercise:  Instructed pt in NWB LUE, pt verbalized understanding although requires max verbal cues to  remember this throughout session. Attempted seated LAQ with 5lb ankle weights each LE but pt reports pain with this and says, "just take them off" saying that it hurts her Bil lower legs.  Seated LAQ with RTB x10 each LE, pt denied pain with this.  Seated marching x10 each LE, very challenging for pt and pt reporting fatigue at end of exercise.  Seated Bil hip ER/Abd with RTB around knees  Bridges in hooklying x10 with cues for glute activation  Marches in hooklying x10 each LE with cues for core activation  Hooklying Bil hip adduction ball squeeze with 5 second holds x10             PT Education - 12/30/16 1432    Education provided Yes   Education Details Exercise technique; instruction for NWB LUE   Person(s) Educated Patient   Methods Explanation;Demonstration;Verbal cues   Comprehension Verbalized understanding;Returned  demonstration;Need further instruction             PT Long Term Goals - 12/17/16 1029      PT LONG TERM GOAL #1   Title Patient will complete a TUG test in <12 seconds without an AD for independent mobility and decreased fall risk in 8 weeks.    Baseline 35.71 sec   Time 8   Period Weeks   Status New     PT LONG TERM GOAL #2   Title Patient (83 years old) will complete 5x/sit to stand test in < 14.2 seconds indicating a decreased likelihood of a balance dysfunction and improved LE strength in 8 weeks.    Baseline 55.42 sec   Time 8   Period Weeks   Status New     PT LONG TERM GOAL #3   Title Patient will be independent with ambulation with LRAD on unlevel surfaces without LOB for decreased fall risk in 8 weeks   Baseline gait speed 28.28 m/sec and cGA for ambulation with rollator    Time 8   Period Weeks   Status New     PT LONG TERM GOAL #4   Title Patient will be independent with supine to sit and sit to supine bed mobiity in 8 weeks.   Baseline CGA   Time 8   Period Weeks   Status New     PT LONG TERM GOAL #5   Title Patient will increase six minute walk test distance to >1000 for progression to community ambulator and improve gait ability   Baseline 635 feet   Time 8   Period Weeks   Status New               Plan - 12/30/16 1452    Clinical Impression Statement Pt presents reporting fatigue and seems to be self limiting during session.  She demonstrates pain behaviors and anxiety about performing exercises before trying them.  She requires max encouragement to perform exercises but is agreeable.  She demonstrates fatigue with low level strengthening exercises.  Pt will benefit from continued skilled PT interventions for improved strength, balance, and QOL.   Rehab Potential Good   Clinical Impairments Affecting Rehab Potential This patient presents with 2 personal factors/ comorbidities current situation, falls, and 4  body elements including body  structures and functions, activity limitations and or participation restrictions: decreased strength, decreased gait, decreased standing balance, decreased sit to stand transfers. Patient's condition is, evolving   PT Frequency 2x / week   PT Duration 8 weeks   PT Treatment/Interventions Ultrasound;Moist Heat;Electrical  Stimulation;Gait training;Stair training;Functional mobility training;Neuromuscular re-education;Balance training;Therapeutic exercise;Therapeutic activities;Patient/family education   PT Next Visit Plan Strengthening and balance   PT Home Exercise Plan marching and LAQ   Consulted and Agree with Plan of Care Patient      Patient will benefit from skilled therapeutic intervention in order to improve the following deficits and impairments:  Abnormal gait, Decreased balance, Decreased mobility, Decreased endurance, Difficulty walking, Decreased range of motion, Decreased coordination, Decreased knowledge of use of DME, Decreased safety awareness, Decreased strength, Pain  Visit Diagnosis: Difficulty walking  Weakness generalized     Problem List Patient Active Problem List   Diagnosis Date Noted  . Closed fracture of proximal end of left humerus with routine healing 08/28/2016  . Acute cystitis 04/28/2016  . Chest pain 03/14/2015  . TIA (transient ischemic attack) 01/29/2015  . Allergic arthritis, hand     Collie Siad PT, DPT 12/30/2016, 3:13 PM  [place] MAIN [facility] SERVICES [address] [place], [place], [address] Phone: [phone]   Fax:  [phone]  Name: JOYANN SPIDLE MRN: [id number] Date of Birth: [birth year]

## 2016-12-31 DIAGNOSIS — M7542 Impingement syndrome of left shoulder: Secondary | ICD-10-CM | POA: Diagnosis not present

## 2016-12-31 DIAGNOSIS — Z79899 Other long term (current) drug therapy: Secondary | ICD-10-CM | POA: Diagnosis not present

## 2016-12-31 DIAGNOSIS — M19012 Primary osteoarthritis, left shoulder: Secondary | ICD-10-CM | POA: Diagnosis not present

## 2016-12-31 DIAGNOSIS — S42222D 2-part displaced fracture of surgical neck of left humerus, subsequent encounter for fracture with routine healing: Secondary | ICD-10-CM | POA: Diagnosis not present

## 2016-12-31 DIAGNOSIS — M1710 Unilateral primary osteoarthritis, unspecified knee: Secondary | ICD-10-CM | POA: Diagnosis not present

## 2016-12-31 DIAGNOSIS — S42222A 2-part displaced fracture of surgical neck of left humerus, initial encounter for closed fracture: Secondary | ICD-10-CM | POA: Diagnosis not present

## 2016-12-31 DIAGNOSIS — M1712 Unilateral primary osteoarthritis, left knee: Secondary | ICD-10-CM | POA: Insufficient documentation

## 2017-01-04 ENCOUNTER — Ambulatory Visit: Payer: Medicare Other | Admitting: Physical Therapy

## 2017-01-04 ENCOUNTER — Ambulatory Visit: Payer: Medicare Other

## 2017-01-04 ENCOUNTER — Ambulatory Visit: Payer: Medicare Other | Admitting: Occupational Therapy

## 2017-01-06 ENCOUNTER — Ambulatory Visit: Payer: Medicare Other

## 2017-01-06 ENCOUNTER — Ambulatory Visit: Payer: Medicare Other | Admitting: Physical Therapy

## 2017-01-06 ENCOUNTER — Encounter: Payer: Medicare Other | Admitting: Occupational Therapy

## 2017-01-11 ENCOUNTER — Ambulatory Visit: Payer: Medicare Other

## 2017-01-11 VITALS — BP 169/45 | HR 61

## 2017-01-11 DIAGNOSIS — R262 Difficulty in walking, not elsewhere classified: Secondary | ICD-10-CM

## 2017-01-11 DIAGNOSIS — M6281 Muscle weakness (generalized): Secondary | ICD-10-CM | POA: Diagnosis not present

## 2017-01-11 DIAGNOSIS — R278 Other lack of coordination: Secondary | ICD-10-CM | POA: Diagnosis not present

## 2017-01-11 DIAGNOSIS — R531 Weakness: Secondary | ICD-10-CM

## 2017-01-11 NOTE — Therapy (Signed)
[place] MAIN [facility] SERVICES [address] [place], [place], [address] Phone: [phone]   Fax:  [phone]  Physical Therapy Treatment  Patient Details  Name: Stephanie Harris MRN: [id number] Date of Birth: May 19, 1931 Referring Provider: [name]   Encounter Date: 01/11/2017      PT End of Session - 01/11/17 1443    Visit Number 5   Number of Visits 17   Date for PT Re-Evaluation 02/27/17   Authorization Type g codes   Authorization Time Period 5/10   PT Start Time 1435   PT Stop Time 1515   PT Time Calculation (min) 40 min   Equipment Utilized During Treatment Gait belt   Activity Tolerance Patient tolerated treatment well   Behavior During Therapy Anxious  anxious about pain with exercises      Past Medical History:  Diagnosis Date  . CML (chronic myelocytic leukemia) ([place])    Onc at [facility]  . Heart disease   . Hypertension   . Leukemia ([place])   . RA (rheumatoid arthritis) ([place])   . Stroke ([place])   . Thalassemia     Past Surgical History:  Procedure Laterality Date  . ABDOMINAL HYSTERECTOMY    . CAROTID ENDARTERECTOMY Left   . CATARACT EXTRACTION    . CHOLECYSTECTOMY    . PERCUTANEOUS CORONARY STENT INTERVENTION (PCI-S)    . ROTATOR CUFF REPAIR Left   . TOTAL KNEE ARTHROPLASTY Right     Vitals:   01/11/17 1433  BP: (!) 169/45  Pulse: 61  SpO2: 100%        Subjective Assessment - 01/11/17 1433    Subjective Pt reports she is doing well today. She is having some L arm soreness today but states that she used her arm a lot this morning even though she isn't supposed to use it for anything per her MD. She has seen her orthopedist since the last therapy session and had a steroid injection in her left shoulder and left knee. No specific questions or concerns at this time.   Patient is accompained by: Family member   Pertinent History Patient fell down in Jul 04, 2016 and she fractured her left arm. She has been  having home health nursing. She has lost a lot of weight in the last month. She is walking with a rollator but is not as active and she has difficulty with getting off her couch. She is getting weaker and is able to do less and less activity at home and less walking. She uses a rollator at home and does not walk outside of the home due to weakness and fatigue.    Limitations Standing;Walking   How long can you sit comfortably? unlimited   How long can you stand comfortably? less than 5 minutes   How long can you walk comfortably? short distances in her home with rollator   Patient Stated Goals to walk better and move around better   Currently in Pain? Yes   Pain Score 6    Pain Location Shoulder   Pain Orientation Left   Pain Descriptors / Indicators Aching   Pain Type Chronic pain   Pain Onset More than a month ago          TREATMENT  Therapeutic Exercise NuStep L1 x 5 minutes during history, RUE utilized but not LUE; Instructed pt in NWB LUE, pt verbalized understanding although requires max verbal cues to remember this throughout session.  Leg press 60#  2 x 10, attempted with 75# initially but pt unable to perform one rep without assistance, she fatigues at 10 repetitions today; Seated LAQ with RTB x10 each LE, pt denied pain with this.  Seated marching x 10 each LE, RTB resistance;  Seated Bil hip ER/Abd with manual resistance x 10; Seated Bil hip Add with manual resistance x 10; Seated heel raises with manual resistance x 10; Pt moves very slowly and is easily fatigued during session. She requires frequent redirection and correction for exercise technique.                     PT Education - 01/11/17 1443    Education provided Yes   Education Details Exercise technique   Person(s) Educated Patient   Methods Explanation   Comprehension Verbalized understanding             PT Long Term Goals - 12/17/16 1029      PT LONG TERM GOAL #1   Title Patient  will complete a TUG test in <12 seconds without an AD for independent mobility and decreased fall risk in 8 weeks.    Baseline 35.71 sec   Time 8   Period Weeks   Status New     PT LONG TERM GOAL #2   Title Patient (>71 years old) will complete 5x/sit to stand test in < 14.2 seconds indicating a decreased likelihood of a balance dysfunction and improved LE strength in 8 weeks.    Baseline 55.42 sec   Time 8   Period Weeks   Status New     PT LONG TERM GOAL #3   Title Patient will be independent with ambulation with LRAD on unlevel surfaces without LOB for decreased fall risk in 8 weeks   Baseline gait speed 28.28 m/sec and cGA for ambulation with rollator    Time 8   Period Weeks   Status New     PT LONG TERM GOAL #4   Title Patient will be independent with supine to sit and sit to supine bed mobiity in 8 weeks.   Baseline CGA   Time 8   Period Weeks   Status New     PT LONG TERM GOAL #5   Title Patient will increase six minute walk test distance to >1000 for progression to community ambulator and improve gait ability   Baseline 635 feet   Time 8   Period Weeks   Status New               Plan - 01/11/17 1443    Clinical Impression Statement Pt seen by therapist for the first time but when comparing her performance today with prior sessions she appears weaker. She is unable to perform one repetition of leg press at 75#. She is able to perform 10 repetitions at 60# but fatigues by the end of the set. She is very easily fatigued today and moves very slowly.    Rehab Potential Good   Clinical Impairments Affecting Rehab Potential This patient presents with 2 personal factors/ comorbidities current situation, falls, and 4  body elements including body structures and functions, activity limitations and or participation restrictions: decreased strength, decreased gait, decreased standing balance, decreased sit to stand transfers. Patient's condition is, evolving   PT Frequency  2x / week   PT Duration 8 weeks   PT Treatment/Interventions Ultrasound;Moist Heat;Electrical Stimulation;Gait training;Stair training;Functional mobility training;Neuromuscular re-education;Balance training;Therapeutic exercise;Therapeutic activities;Patient/family education   PT Next Visit Plan Strengthening and  balance   PT Home Exercise Plan marching and LAQ   Consulted and Agree with Plan of Care Patient      Patient will benefit from skilled therapeutic intervention in order to improve the following deficits and impairments:  Abnormal gait, Decreased balance, Decreased mobility, Decreased endurance, Difficulty walking, Decreased range of motion, Decreased coordination, Decreased knowledge of use of DME, Decreased safety awareness, Decreased strength, Pain  Visit Diagnosis: Difficulty walking  Weakness generalized     Problem List Patient Active Problem List   Diagnosis Date Noted  . Closed fracture of proximal end of left humerus with routine healing 08/28/2016  . Acute cystitis 04/28/2016  . Chest pain 03/14/2015  . TIA (transient ischemic attack) 01/29/2015  . Allergic arthritis, hand    Phillips Grout PT, DPT   Avelardo Reesman 01/11/2017, 3:30 PM  [place] MAIN [facility] SERVICES [address] [place], [place], [address] Phone: [phone]   Fax:  [phone]  Name: Stephanie Harris MRN: [id number] Date of Birth: 1930/10/14

## 2017-01-13 ENCOUNTER — Ambulatory Visit: Payer: Medicare Other | Admitting: Physical Therapy

## 2017-01-13 ENCOUNTER — Ambulatory Visit: Payer: Medicare Other | Admitting: Occupational Therapy

## 2017-01-14 DIAGNOSIS — D649 Anemia, unspecified: Secondary | ICD-10-CM | POA: Diagnosis not present

## 2017-01-14 DIAGNOSIS — D8989 Other specified disorders involving the immune mechanism, not elsewhere classified: Secondary | ICD-10-CM | POA: Diagnosis not present

## 2017-01-14 DIAGNOSIS — N390 Urinary tract infection, site not specified: Secondary | ICD-10-CM | POA: Diagnosis not present

## 2017-01-14 DIAGNOSIS — D638 Anemia in other chronic diseases classified elsewhere: Secondary | ICD-10-CM | POA: Diagnosis not present

## 2017-01-14 DIAGNOSIS — C91Z0 Other lymphoid leukemia not having achieved remission: Secondary | ICD-10-CM | POA: Diagnosis not present

## 2017-01-20 ENCOUNTER — Ambulatory Visit: Payer: Medicare Other | Admitting: Physical Therapy

## 2017-01-20 ENCOUNTER — Ambulatory Visit: Payer: Medicare Other | Admitting: Occupational Therapy

## 2017-01-25 ENCOUNTER — Ambulatory Visit: Payer: Medicare Other | Admitting: Physical Therapy

## 2017-01-25 ENCOUNTER — Ambulatory Visit: Payer: Medicare Other | Admitting: Occupational Therapy

## 2017-01-27 ENCOUNTER — Encounter: Payer: Medicare Other | Admitting: Occupational Therapy

## 2017-01-27 ENCOUNTER — Ambulatory Visit: Payer: Medicare Other | Admitting: Physical Therapy

## 2017-01-28 DIAGNOSIS — Z87440 Personal history of urinary (tract) infections: Secondary | ICD-10-CM | POA: Diagnosis not present

## 2017-01-28 DIAGNOSIS — D631 Anemia in chronic kidney disease: Secondary | ICD-10-CM | POA: Diagnosis not present

## 2017-01-28 DIAGNOSIS — D638 Anemia in other chronic diseases classified elsewhere: Secondary | ICD-10-CM | POA: Diagnosis not present

## 2017-01-28 DIAGNOSIS — E1122 Type 2 diabetes mellitus with diabetic chronic kidney disease: Secondary | ICD-10-CM | POA: Diagnosis not present

## 2017-01-28 DIAGNOSIS — D563 Thalassemia minor: Secondary | ICD-10-CM | POA: Diagnosis not present

## 2017-01-28 DIAGNOSIS — N189 Chronic kidney disease, unspecified: Secondary | ICD-10-CM | POA: Diagnosis not present

## 2017-01-28 DIAGNOSIS — I129 Hypertensive chronic kidney disease with stage 1 through stage 4 chronic kidney disease, or unspecified chronic kidney disease: Secondary | ICD-10-CM | POA: Diagnosis not present

## 2017-01-28 DIAGNOSIS — R634 Abnormal weight loss: Secondary | ICD-10-CM | POA: Diagnosis not present

## 2017-01-28 DIAGNOSIS — C91Z0 Other lymphoid leukemia not having achieved remission: Secondary | ICD-10-CM | POA: Diagnosis not present

## 2017-02-01 ENCOUNTER — Ambulatory Visit: Payer: Medicare Other | Admitting: Physical Therapy

## 2017-02-01 ENCOUNTER — Encounter: Payer: Medicare Other | Admitting: Occupational Therapy

## 2017-02-03 ENCOUNTER — Encounter: Payer: Medicare Other | Admitting: Occupational Therapy

## 2017-02-03 ENCOUNTER — Ambulatory Visit: Payer: Medicare Other | Admitting: Physical Therapy

## 2017-02-08 ENCOUNTER — Encounter: Payer: Medicare Other | Admitting: Occupational Therapy

## 2017-02-08 ENCOUNTER — Ambulatory Visit: Payer: Medicare Other | Admitting: Physical Therapy

## 2017-02-10 ENCOUNTER — Encounter: Payer: Medicare Other | Admitting: Occupational Therapy

## 2017-02-10 ENCOUNTER — Ambulatory Visit: Payer: Medicare Other | Admitting: Physical Therapy

## 2017-02-11 DIAGNOSIS — D631 Anemia in chronic kidney disease: Secondary | ICD-10-CM | POA: Diagnosis not present

## 2017-02-11 DIAGNOSIS — N189 Chronic kidney disease, unspecified: Secondary | ICD-10-CM | POA: Diagnosis not present

## 2017-02-11 DIAGNOSIS — C91Z0 Other lymphoid leukemia not having achieved remission: Secondary | ICD-10-CM | POA: Diagnosis not present

## 2017-02-15 ENCOUNTER — Encounter: Payer: Medicare Other | Admitting: Occupational Therapy

## 2017-02-15 ENCOUNTER — Ambulatory Visit: Payer: Medicare Other | Admitting: Physical Therapy

## 2017-02-17 ENCOUNTER — Ambulatory Visit: Payer: Medicare Other | Admitting: Physical Therapy

## 2017-02-17 ENCOUNTER — Encounter: Payer: Medicare Other | Admitting: Occupational Therapy

## 2017-02-21 DIAGNOSIS — I639 Cerebral infarction, unspecified: Secondary | ICD-10-CM

## 2017-02-21 HISTORY — DX: Cerebral infarction, unspecified: I63.9

## 2017-02-25 DIAGNOSIS — D638 Anemia in other chronic diseases classified elsewhere: Secondary | ICD-10-CM | POA: Diagnosis not present

## 2017-02-25 DIAGNOSIS — Z79899 Other long term (current) drug therapy: Secondary | ICD-10-CM | POA: Diagnosis not present

## 2017-02-25 DIAGNOSIS — C91Z0 Other lymphoid leukemia not having achieved remission: Secondary | ICD-10-CM | POA: Diagnosis not present

## 2017-02-25 DIAGNOSIS — Z5181 Encounter for therapeutic drug level monitoring: Secondary | ICD-10-CM | POA: Diagnosis not present

## 2017-03-11 DIAGNOSIS — D649 Anemia, unspecified: Secondary | ICD-10-CM | POA: Diagnosis not present

## 2017-03-23 ENCOUNTER — Emergency Department: Payer: Medicare Other

## 2017-03-23 ENCOUNTER — Emergency Department
Admission: EM | Admit: 2017-03-23 | Discharge: 2017-03-23 | Disposition: A | Discharge status: Other Acute Inpatient Hospital | Payer: Medicare Other | Attending: Emergency Medicine | Admitting: Emergency Medicine

## 2017-03-23 ENCOUNTER — Encounter: Payer: Self-pay | Admitting: *Deleted

## 2017-03-23 DIAGNOSIS — R200 Anesthesia of skin: Secondary | ICD-10-CM | POA: Diagnosis present

## 2017-03-23 DIAGNOSIS — Z96659 Presence of unspecified artificial knee joint: Secondary | ICD-10-CM | POA: Diagnosis not present

## 2017-03-23 DIAGNOSIS — Z96651 Presence of right artificial knee joint: Secondary | ICD-10-CM | POA: Diagnosis present

## 2017-03-23 DIAGNOSIS — E039 Hypothyroidism, unspecified: Secondary | ICD-10-CM | POA: Diagnosis present

## 2017-03-23 DIAGNOSIS — Z87440 Personal history of urinary (tract) infections: Secondary | ICD-10-CM | POA: Diagnosis not present

## 2017-03-23 DIAGNOSIS — Z8673 Personal history of transient ischemic attack (TIA), and cerebral infarction without residual deficits: Secondary | ICD-10-CM | POA: Diagnosis not present

## 2017-03-23 DIAGNOSIS — I69198 Other sequelae of nontraumatic intracerebral hemorrhage: Secondary | ICD-10-CM | POA: Diagnosis not present

## 2017-03-23 DIAGNOSIS — D696 Thrombocytopenia, unspecified: Secondary | ICD-10-CM | POA: Diagnosis present

## 2017-03-23 DIAGNOSIS — Z79899 Other long term (current) drug therapy: Secondary | ICD-10-CM | POA: Diagnosis not present

## 2017-03-23 DIAGNOSIS — Z880 Allergy status to penicillin: Secondary | ICD-10-CM | POA: Diagnosis not present

## 2017-03-23 DIAGNOSIS — K5900 Constipation, unspecified: Secondary | ICD-10-CM | POA: Diagnosis present

## 2017-03-23 DIAGNOSIS — I509 Heart failure, unspecified: Secondary | ICD-10-CM | POA: Diagnosis not present

## 2017-03-23 DIAGNOSIS — R52 Pain, unspecified: Secondary | ICD-10-CM

## 2017-03-23 DIAGNOSIS — Z8679 Personal history of other diseases of the circulatory system: Secondary | ICD-10-CM | POA: Insufficient documentation

## 2017-03-23 DIAGNOSIS — Z7982 Long term (current) use of aspirin: Secondary | ICD-10-CM | POA: Diagnosis not present

## 2017-03-23 DIAGNOSIS — R29818 Other symptoms and signs involving the nervous system: Secondary | ICD-10-CM | POA: Diagnosis not present

## 2017-03-23 DIAGNOSIS — I611 Nontraumatic intracerebral hemorrhage in hemisphere, cortical: Secondary | ICD-10-CM | POA: Diagnosis not present

## 2017-03-23 DIAGNOSIS — D591 Other autoimmune hemolytic anemias: Secondary | ICD-10-CM | POA: Diagnosis not present

## 2017-03-23 DIAGNOSIS — I619 Nontraumatic intracerebral hemorrhage, unspecified: Secondary | ICD-10-CM | POA: Insufficient documentation

## 2017-03-23 DIAGNOSIS — C9590 Leukemia, unspecified not having achieved remission: Secondary | ICD-10-CM | POA: Diagnosis not present

## 2017-03-23 DIAGNOSIS — G4700 Insomnia, unspecified: Secondary | ICD-10-CM | POA: Diagnosis not present

## 2017-03-23 DIAGNOSIS — E1122 Type 2 diabetes mellitus with diabetic chronic kidney disease: Secondary | ICD-10-CM | POA: Diagnosis present

## 2017-03-23 DIAGNOSIS — E785 Hyperlipidemia, unspecified: Secondary | ICD-10-CM | POA: Diagnosis present

## 2017-03-23 DIAGNOSIS — I6529 Occlusion and stenosis of unspecified carotid artery: Secondary | ICD-10-CM | POA: Diagnosis not present

## 2017-03-23 DIAGNOSIS — I10 Essential (primary) hypertension: Secondary | ICD-10-CM | POA: Diagnosis not present

## 2017-03-23 DIAGNOSIS — R29810 Facial weakness: Secondary | ICD-10-CM | POA: Diagnosis not present

## 2017-03-23 DIAGNOSIS — Z856 Personal history of leukemia: Secondary | ICD-10-CM | POA: Diagnosis not present

## 2017-03-23 DIAGNOSIS — M6281 Muscle weakness (generalized): Secondary | ICD-10-CM | POA: Diagnosis not present

## 2017-03-23 DIAGNOSIS — I613 Nontraumatic intracerebral hemorrhage in brain stem: Secondary | ICD-10-CM | POA: Diagnosis not present

## 2017-03-23 DIAGNOSIS — N3281 Overactive bladder: Secondary | ICD-10-CM | POA: Diagnosis not present

## 2017-03-23 DIAGNOSIS — Z9861 Coronary angioplasty status: Secondary | ICD-10-CM | POA: Diagnosis not present

## 2017-03-23 DIAGNOSIS — I129 Hypertensive chronic kidney disease with stage 1 through stage 4 chronic kidney disease, or unspecified chronic kidney disease: Secondary | ICD-10-CM | POA: Diagnosis present

## 2017-03-23 DIAGNOSIS — I610 Nontraumatic intracerebral hemorrhage in hemisphere, subcortical: Secondary | ICD-10-CM | POA: Diagnosis present

## 2017-03-23 DIAGNOSIS — I252 Old myocardial infarction: Secondary | ICD-10-CM | POA: Diagnosis not present

## 2017-03-23 DIAGNOSIS — I2510 Atherosclerotic heart disease of native coronary artery without angina pectoris: Secondary | ICD-10-CM | POA: Diagnosis present

## 2017-03-23 DIAGNOSIS — D589 Hereditary hemolytic anemia, unspecified: Secondary | ICD-10-CM | POA: Diagnosis present

## 2017-03-23 DIAGNOSIS — D563 Thalassemia minor: Secondary | ICD-10-CM | POA: Diagnosis present

## 2017-03-23 DIAGNOSIS — Z91040 Latex allergy status: Secondary | ICD-10-CM | POA: Insufficient documentation

## 2017-03-23 DIAGNOSIS — J45909 Unspecified asthma, uncomplicated: Secondary | ICD-10-CM | POA: Diagnosis present

## 2017-03-23 DIAGNOSIS — Z8672 Personal history of thrombophlebitis: Secondary | ICD-10-CM | POA: Diagnosis not present

## 2017-03-23 DIAGNOSIS — I1310 Hypertensive heart and chronic kidney disease without heart failure, with stage 1 through stage 4 chronic kidney disease, or unspecified chronic kidney disease: Secondary | ICD-10-CM | POA: Diagnosis not present

## 2017-03-23 DIAGNOSIS — I618 Other nontraumatic intracerebral hemorrhage: Secondary | ICD-10-CM

## 2017-03-23 DIAGNOSIS — I69319 Unspecified symptoms and signs involving cognitive functions following cerebral infarction: Secondary | ICD-10-CM | POA: Diagnosis not present

## 2017-03-23 DIAGNOSIS — M810 Age-related osteoporosis without current pathological fracture: Secondary | ICD-10-CM | POA: Diagnosis present

## 2017-03-23 DIAGNOSIS — Q279 Congenital malformation of peripheral vascular system, unspecified: Secondary | ICD-10-CM | POA: Diagnosis not present

## 2017-03-23 DIAGNOSIS — R4781 Slurred speech: Secondary | ICD-10-CM | POA: Diagnosis not present

## 2017-03-23 DIAGNOSIS — N189 Chronic kidney disease, unspecified: Secondary | ICD-10-CM | POA: Diagnosis present

## 2017-03-23 DIAGNOSIS — D72820 Lymphocytosis (symptomatic): Secondary | ICD-10-CM | POA: Diagnosis present

## 2017-03-23 LAB — DIFFERENTIAL
Basophils Absolute: 0 10*3/uL (ref 0–0.1)
Basophils Relative: 1 %
Eosinophils Absolute: 0.1 10*3/uL (ref 0–0.7)
Eosinophils Relative: 1 %
Lymphocytes Relative: 54 %
Lymphs Abs: 3.1 10*3/uL (ref 1.0–3.6)
Monocytes Absolute: 0.7 10*3/uL (ref 0.2–0.9)
Monocytes Relative: 13 %
Neutro Abs: 1.7 10*3/uL (ref 1.4–6.5)
Neutrophils Relative %: 31 %

## 2017-03-23 LAB — COMPREHENSIVE METABOLIC PANEL
ALT: 12 U/L — ABNORMAL LOW (ref 14–54)
AST: 26 U/L (ref 15–41)
Albumin: 3.8 g/dL (ref 3.5–5.0)
Alkaline Phosphatase: 29 U/L — ABNORMAL LOW (ref 38–126)
Anion gap: 9 (ref 5–15)
BUN: 18 mg/dL (ref 6–20)
CO2: 24 mmol/L (ref 22–32)
Calcium: 9.8 mg/dL (ref 8.9–10.3)
Chloride: 112 mmol/L — ABNORMAL HIGH (ref 101–111)
Creatinine, Ser: 0.94 mg/dL (ref 0.44–1.00)
GFR calc Af Amer: >60 mL/min (ref >60)
GFR calc non Af Amer: 54 mL/min — ABNORMAL LOW (ref >60)
Glucose, Bld: 108 mg/dL — ABNORMAL HIGH (ref 65–99)
Potassium: 3.7 mmol/L (ref 3.5–5.1)
Sodium: 145 mmol/L (ref 135–145)
Total Bilirubin: 2.2 mg/dL — ABNORMAL HIGH (ref 0.3–1.2)
Total Protein: 6.9 g/dL (ref 6.5–8.1)

## 2017-03-23 LAB — CBC
HCT: 29 % — ABNORMAL LOW (ref 35.0–47.0)
Hemoglobin: 9 g/dL — ABNORMAL LOW (ref 12.0–16.0)
MCH: 19.3 pg — ABNORMAL LOW (ref 26.0–34.0)
MCHC: 31.2 g/dL — ABNORMAL LOW (ref 32.0–36.0)
MCV: 61.8 fL — ABNORMAL LOW (ref 80.0–100.0)
Platelets: 108 10*3/uL — ABNORMAL LOW (ref 150–440)
RBC: 4.69 MIL/uL (ref 3.80–5.20)
RDW: 18 % — ABNORMAL HIGH (ref 11.5–14.5)
WBC: 5.7 10*3/uL (ref 3.6–11.0)

## 2017-03-23 LAB — PROTIME-INR
INR: 1.11
Prothrombin Time: 14.4 seconds (ref 11.4–15.2)

## 2017-03-23 LAB — ETHANOL: Alcohol, Ethyl (B): <5 mg/dL (ref <5)

## 2017-03-23 LAB — APTT: aPTT: 32 seconds (ref 24–36)

## 2017-03-23 MED ORDER — IOPAMIDOL (ISOVUE-370) INJECTION 76%: 75.0000 mL | Freq: Once | INTRAVENOUS | Status: DC | PRN

## 2017-03-23 MED ORDER — NICARDIPINE HCL IN NACL 20-0.86 MG/200ML-% IV SOLN
0.0000 mg/h | INTRAVENOUS | Status: DC
  Administered 2017-03-23: 5 mg/h via INTRAVENOUS
  Filled 2017-03-23: qty 200

## 2017-03-23 NOTE — ED Notes (Signed)
Patient transported to CT 

## 2017-03-23 NOTE — ED Provider Notes (Signed)
[facility] Emergency Department Provider Note  ____________________________________________   First MD Initiated Contact with Patient 03/23/17 1609     (approximate)  I have reviewed the triage vital signs and the nursing notes.   HISTORY  Chief Complaint Numbness   HPI Stephanie Harris is a 83 y.o. female who comes to the emergency department via EMS with sudden onset tingling in numbness in bilateral hands and bilateral legs. She was checking out at the [employer] when suddenly she began to feel weak and numb. EMS found her on the floor unable to stand up. She has a remote history of multiple strokes, multiple TIAs, andcarotid artery stenosis. Her last known well time was 3:50 PM. Her symptoms began suddenly. They have not been progressive. She takes a daily baby aspirin but no other blood thinning or antiplatelet medication.   Past Medical History:  Diagnosis Date  . CML (chronic myelocytic leukemia) ([place])    Onc at [facility]  . Heart disease   . Hypertension   . Leukemia ([place])   . RA (rheumatoid arthritis) ([place])   . Stroke ([place])   . Thalassemia     Patient Active Problem List   Diagnosis Date Noted  . Closed fracture of proximal end of left humerus with routine healing 08/28/2016  . Acute cystitis 04/28/2016  . Chest pain 03/14/2015  . TIA (transient ischemic attack) 01/29/2015  . Allergic arthritis, hand     Past Surgical History:  Procedure Laterality Date  . ABDOMINAL HYSTERECTOMY    . CAROTID ENDARTERECTOMY Left   . CATARACT EXTRACTION    . CHOLECYSTECTOMY    . PERCUTANEOUS CORONARY STENT INTERVENTION (PCI-S)    . ROTATOR CUFF REPAIR Left   . TOTAL KNEE ARTHROPLASTY Right     Prior to Admission medications   Medication Sig Start Date End Date Taking? Authorizing Provider  ascorbic acid (VITAMIN C) 1000 MG tablet Take 1,000 mg by mouth daily.    [provider]  aspirin 81 MG chewable tablet Chew by mouth daily.     [provider]  carvedilol (COREG) 3.125 MG tablet Take 6.25 mg by mouth 2 (two) times daily.     [provider]  cholecalciferol (VITAMIN D) 1000 UNITS tablet Take 1,000 Units by mouth daily.    [provider]  folic acid (FOLVITE) 1 MG tablet Take 1 mg by mouth daily.    [provider]  HYDROcodone-acetaminophen (NORCO/VICODIN) 5-325 MG tablet Take 1 tablet by mouth every 4 (four) hours as needed for moderate pain. Patient not taking: Reported on 08/28/2016 07/08/16 07/08/17  [name], MD  levothyroxine (SYNTHROID, LEVOTHROID) 50 MCG tablet Take 50 mcg by mouth daily. On the opposite days from taking 75mcg.    [provider]  ondansetron (ZOFRAN ODT) 4 MG disintegrating tablet Take 1 tablet (4 mg total) by mouth every 8 (eight) hours as needed for nausea or vomiting. Patient not taking: Reported on 08/28/2016 07/08/16   [name], MD  saccharomyces boulardii (FLORASTOR) 250 MG capsule Take 1 capsule (250 mg total) by mouth 2 (two) times daily. Patient not taking: Reported on 03/23/2017 01/30/15   [name], MD  vitamin E 400 UNIT capsule Take 400 Units by mouth daily.    [provider]    Allergies Penicillins and Latex  Family History  Problem Relation Age of Onset  . Acute myelogenous leukemia Brother   . Stroke Mother   . Heart attack Father     Social  History Social History  Substance Use Topics  . Smoking status: Never Smoker  . Smokeless tobacco: Never Used  . Alcohol use No    Review of Systems Constitutional: No fever/chills Eyes: No visual changes. ENT: No sore throat. Cardiovascular: Denies chest pain. Respiratory: Denies shortness of breath. Gastrointestinal: No abdominal pain.  No nausea, no vomiting.  No diarrhea.  No constipation. Genitourinary: Negative for dysuria. Musculoskeletal: Negative for back pain. Skin: Negative for rash. Neurological: Positive for headaches, positive for  focal weakness and numbness.   ____________________________________________   PHYSICAL EXAM:  VITAL SIGNS: ED Triage Vitals [03/23/17 1612]  Enc Vitals Group     BP (!) 162/78     Pulse Rate 79     Resp 20     Temp 98.6 F (37 C)     Temp Source Oral     SpO2 99 %     Weight 97 lb (44 kg)     Height 5' (1.524 m)     Head Circumference      Peak Flow      Pain Score      Pain Loc      Pain Edu?      Excl. in [place]?     Constitutional:Alert and oriented 4 pleasant cooperative speaks in full clear sentences Eyes: PERRL EOMI. Head: Atraumatic. Nose: No congestion/rhinnorhea. Mouth/Throat: No trismus Neck: No stridor.   Cardiovascular: Normal rate, regular rhythm. Grossly normal heart sounds.  Good peripheral circulation. Respiratory: Normal respiratory effort.  No retractions. Lungs CTAB and moving good air Gastrointestinal: Soft nontender Musculoskeletal: No lower extremity edema   Neurologic:  Normal speech and language.  Right-sided pronator drift not on the left 1/5 strength left lower extremity 4 minus out of 5 strength right lower extremity Cranial nerves II through XII intact Skin:  Skin is warm, dry and intact. No rash noted. Psychiatric: Mood and affect are normal. Speech and behavior are normal.    ____________________________________________   DIFFERENTIAL includes but not limited to  Ischemic stroke, hemorrhagic stroke, migraine headache, carotid artery dissection ____________________________________________   LABS (all labs ordered are listed, but only abnormal results are displayed)  Labs Reviewed  CBC - Abnormal; Notable for the following:       Result Value   Hemoglobin 9.0 (*)    HCT 29.0 (*)    MCV 61.8 (*)    MCH 19.3 (*)    MCHC 31.2 (*)    RDW 18.0 (*)    Platelets 108 (*)    All other components within normal limits  COMPREHENSIVE METABOLIC PANEL - Abnormal; Notable for the following:    Chloride 112 (*)    Glucose, Bld 108 (*)     ALT 12 (*)    Alkaline Phosphatase 29 (*)    Total Bilirubin 2.2 (*)    GFR calc non Af Amer 54 (*)    All other components within normal limits  ETHANOL  PROTIME-INR  APTT  DIFFERENTIAL  RAPID URINE DRUG SCREEN, HOSP PERFORMED  URINALYSIS, ROUTINE W REFLEX MICROSCOPIC  I-STAT CHEM 8, ED  I-STAT TROPONIN, ED    Hemoglobin slightly down from baseline __________________________________________  EKG  ED ECG REPORT I, Darel Hong, the attending physician, personally viewed and interpreted this ECG.  Date: 03/23/2017 Rate: 94 QRS Axis: normal Intervals: normal ST/T Wave abnormalities: normal Narrative Interpretation: Wavy baseline difficult to interpret heart rate at 94 further interpretation impossible  ____________________________________________  RADIOLOGY  Head CT shows left basal cannula hemorrhagic stroke  ____________________________________________   PROCEDURES  Procedure(s) performed: no  Procedures  Critical Care performed: yes  CRITICAL CARE Performed by: Darel Hong   Total critical care time: 40 minutes  Critical care time was exclusive of separately billable procedures and treating other patients.  Critical care was necessary to treat or prevent imminent or life-threatening deterioration.  Critical care was time spent personally by me on the following activities: development of treatment plan with patient and/or surrogate as well as nursing, discussions with consultants, evaluation of patient's response to treatment, examination of patient, obtaining history from patient or surrogate, ordering and performing treatments and interventions, ordering and review of laboratory studies, ordering and review of radiographic studies, pulse oximetry and re-evaluation of patient's condition.   Observation: no ____________________________________________   INITIAL IMPRESSION / ASSESSMENT AND PLAN / ED COURSE  Pertinent labs & imaging results that were  available during my care of the patient were reviewed by me and considered in my medical decision making (see chart for details).  On arrival the patient has right-sided pronator drift as well as one out of 5 strength in the right lower extremity and 4 minus out of 5 strength in the left lower extremity. I'm concerned that she has a large vessel occlusion and with known carotid stenosis I will CT angio her head and neck along with the CT noncontrast prior to labs.    ----------------------------------------- 4:33 PM on 03/23/2017 -----------------------------------------  My wet read of the patient's noncontrast head CT shows intracerebral hemorrhage in the left basal ganglion. I will defer CT angiogram.  _______________________________  ----------------------------------------- 4:44 PM on 03/23/2017 -----------------------------------------  I discussed the case with on-call neurosurgeon Dr.  Aris Lot who recommends blood pressure control to keep the systolic blood pressure around 140, to defer keppra, and transfer to the [facility] ICU.   ----------------------------------------- 5:17 PM on 03/23/2017 -----------------------------------------  I discussed the case with [facility] neurolog[facility] ICU attending Dr. Ronny Flurry who has graciously agreed to admit the patient is a transfer.  FINAL CLINICAL IMPRESSION(S) / ED DIAGNOSES  Final diagnoses:  Pain  Other left-sided nontraumatic intracerebral hemorrhage (HCC)      NEW MEDICATIONS STARTED DURING THIS VISIT:  New Prescriptions   No medications on file     Note:  This document was prepared using Dragon voice recognition software and may include unintentional dictation errors.     Darel Hong, MD 03/23/17 [phone]

## 2017-03-23 NOTE — ED Notes (Signed)
ED Provider at bedside. 

## 2017-03-23 NOTE — ED Notes (Signed)
Iv meds infusing for blood pressure.  nsr on monitor.

## 2017-03-23 NOTE — ED Notes (Signed)
Pt alert.  Speech clear. 

## 2017-03-23 NOTE — ED Notes (Signed)
Code stroke cancelled by dr Mable Paris.  Pt alert.  md in with pt explaining ct results.  Family with pt.  nsr on monitor.  Iv in place  Labs sent.

## 2017-03-23 NOTE — ED Notes (Signed)
Report called to [facility] RN at [facility] neuro unit 8east.  Pt alert.  nsr on monitor.

## 2017-03-23 NOTE — Progress Notes (Signed)
[facility] received an PG for Code Stroke. Virgil arrived at ED to find room empty as PT was having test run. [place] will follow-up.   03/23/17 1610  Clinical Encounter Type  Visited With Patient not available  Visit Type Code  Referral From Nurse  Consult/Referral To Chaplain  Spiritual Encounters  Spiritual Needs Other (Comment)

## 2017-03-23 NOTE — ED Notes (Signed)
md at bedside on arrival to er.  Pt alert and speech is clear.  Pt reports onset of numbness in arms and legs while in [place] this afternoon.  Sx began at 435pm today.  Pt has headache for 2 days.  No n/v/d.  Iv in place on arrival to room.

## 2017-03-23 NOTE — ED Notes (Signed)
Blood pressure improved.  cardene drip stopped.  Pt alert.  Speech clear.  nsr on monitor.

## 2017-03-29 DIAGNOSIS — C91Z0 Other lymphoid leukemia not having achieved remission: Secondary | ICD-10-CM | POA: Diagnosis not present

## 2017-03-29 DIAGNOSIS — I2510 Atherosclerotic heart disease of native coronary artery without angina pectoris: Secondary | ICD-10-CM | POA: Diagnosis not present

## 2017-03-29 DIAGNOSIS — D638 Anemia in other chronic diseases classified elsewhere: Secondary | ICD-10-CM | POA: Diagnosis not present

## 2017-03-29 DIAGNOSIS — M6281 Muscle weakness (generalized): Secondary | ICD-10-CM | POA: Diagnosis not present

## 2017-03-29 DIAGNOSIS — I779 Disorder of arteries and arterioles, unspecified: Secondary | ICD-10-CM | POA: Diagnosis not present

## 2017-03-29 DIAGNOSIS — D72820 Lymphocytosis (symptomatic): Secondary | ICD-10-CM | POA: Diagnosis not present

## 2017-03-29 DIAGNOSIS — I1310 Hypertensive heart and chronic kidney disease without heart failure, with stage 1 through stage 4 chronic kidney disease, or unspecified chronic kidney disease: Secondary | ICD-10-CM | POA: Diagnosis not present

## 2017-03-29 DIAGNOSIS — I10 Essential (primary) hypertension: Secondary | ICD-10-CM | POA: Diagnosis not present

## 2017-03-29 DIAGNOSIS — I611 Nontraumatic intracerebral hemorrhage in hemisphere, cortical: Secondary | ICD-10-CM | POA: Diagnosis not present

## 2017-03-29 DIAGNOSIS — Z96651 Presence of right artificial knee joint: Secondary | ICD-10-CM | POA: Diagnosis not present

## 2017-03-29 DIAGNOSIS — Z856 Personal history of leukemia: Secondary | ICD-10-CM | POA: Diagnosis not present

## 2017-03-29 DIAGNOSIS — N189 Chronic kidney disease, unspecified: Secondary | ICD-10-CM | POA: Diagnosis not present

## 2017-03-29 DIAGNOSIS — I69198 Other sequelae of nontraumatic intracerebral hemorrhage: Secondary | ICD-10-CM | POA: Diagnosis not present

## 2017-03-29 DIAGNOSIS — D591 Other autoimmune hemolytic anemias: Secondary | ICD-10-CM | POA: Diagnosis not present

## 2017-03-29 DIAGNOSIS — G4700 Insomnia, unspecified: Secondary | ICD-10-CM | POA: Diagnosis not present

## 2017-03-29 DIAGNOSIS — I509 Heart failure, unspecified: Secondary | ICD-10-CM | POA: Diagnosis not present

## 2017-03-29 DIAGNOSIS — R339 Retention of urine, unspecified: Secondary | ICD-10-CM | POA: Diagnosis not present

## 2017-03-29 DIAGNOSIS — K5900 Constipation, unspecified: Secondary | ICD-10-CM | POA: Diagnosis not present

## 2017-03-29 DIAGNOSIS — I6529 Occlusion and stenosis of unspecified carotid artery: Secondary | ICD-10-CM | POA: Diagnosis not present

## 2017-03-29 DIAGNOSIS — I252 Old myocardial infarction: Secondary | ICD-10-CM | POA: Diagnosis not present

## 2017-03-29 DIAGNOSIS — D649 Anemia, unspecified: Secondary | ICD-10-CM | POA: Diagnosis not present

## 2017-03-29 DIAGNOSIS — N390 Urinary tract infection, site not specified: Secondary | ICD-10-CM | POA: Diagnosis not present

## 2017-03-29 DIAGNOSIS — R300 Dysuria: Secondary | ICD-10-CM | POA: Diagnosis not present

## 2017-03-29 DIAGNOSIS — E039 Hypothyroidism, unspecified: Secondary | ICD-10-CM | POA: Diagnosis not present

## 2017-03-29 DIAGNOSIS — N3281 Overactive bladder: Secondary | ICD-10-CM | POA: Diagnosis not present

## 2017-03-29 DIAGNOSIS — Z87440 Personal history of urinary (tract) infections: Secondary | ICD-10-CM | POA: Diagnosis not present

## 2017-03-30 DIAGNOSIS — I779 Disorder of arteries and arterioles, unspecified: Secondary | ICD-10-CM | POA: Diagnosis not present

## 2017-03-30 DIAGNOSIS — I2510 Atherosclerotic heart disease of native coronary artery without angina pectoris: Secondary | ICD-10-CM | POA: Diagnosis not present

## 2017-03-30 DIAGNOSIS — I611 Nontraumatic intracerebral hemorrhage in hemisphere, cortical: Secondary | ICD-10-CM | POA: Diagnosis not present

## 2017-03-30 DIAGNOSIS — C91Z0 Other lymphoid leukemia not having achieved remission: Secondary | ICD-10-CM | POA: Diagnosis not present

## 2017-03-31 DIAGNOSIS — E039 Hypothyroidism, unspecified: Secondary | ICD-10-CM | POA: Diagnosis not present

## 2017-03-31 DIAGNOSIS — G4700 Insomnia, unspecified: Secondary | ICD-10-CM | POA: Diagnosis not present

## 2017-03-31 DIAGNOSIS — K5900 Constipation, unspecified: Secondary | ICD-10-CM | POA: Diagnosis not present

## 2017-03-31 DIAGNOSIS — I10 Essential (primary) hypertension: Secondary | ICD-10-CM | POA: Diagnosis not present

## 2017-04-08 DIAGNOSIS — Z87440 Personal history of urinary (tract) infections: Secondary | ICD-10-CM | POA: Diagnosis not present

## 2017-04-08 DIAGNOSIS — R339 Retention of urine, unspecified: Secondary | ICD-10-CM | POA: Diagnosis not present

## 2017-04-08 DIAGNOSIS — D638 Anemia in other chronic diseases classified elsewhere: Secondary | ICD-10-CM | POA: Diagnosis not present

## 2017-04-08 DIAGNOSIS — N390 Urinary tract infection, site not specified: Secondary | ICD-10-CM | POA: Diagnosis not present

## 2017-04-08 DIAGNOSIS — C91Z0 Other lymphoid leukemia not having achieved remission: Secondary | ICD-10-CM | POA: Diagnosis not present

## 2017-04-08 DIAGNOSIS — R300 Dysuria: Secondary | ICD-10-CM | POA: Diagnosis not present

## 2017-04-08 DIAGNOSIS — K5900 Constipation, unspecified: Secondary | ICD-10-CM | POA: Diagnosis not present

## 2017-04-13 DIAGNOSIS — I10 Essential (primary) hypertension: Secondary | ICD-10-CM | POA: Diagnosis not present

## 2017-04-13 DIAGNOSIS — K5900 Constipation, unspecified: Secondary | ICD-10-CM | POA: Diagnosis not present

## 2017-04-13 DIAGNOSIS — D649 Anemia, unspecified: Secondary | ICD-10-CM | POA: Diagnosis not present

## 2017-04-13 DIAGNOSIS — G4700 Insomnia, unspecified: Secondary | ICD-10-CM | POA: Diagnosis not present

## 2017-04-22 DIAGNOSIS — D630 Anemia in neoplastic disease: Secondary | ICD-10-CM | POA: Diagnosis not present

## 2017-04-22 DIAGNOSIS — C91Z0 Other lymphoid leukemia not having achieved remission: Secondary | ICD-10-CM | POA: Diagnosis not present

## 2017-04-23 DIAGNOSIS — D630 Anemia in neoplastic disease: Secondary | ICD-10-CM | POA: Diagnosis not present

## 2017-04-23 DIAGNOSIS — C9110 Chronic lymphocytic leukemia of B-cell type not having achieved remission: Secondary | ICD-10-CM | POA: Diagnosis not present

## 2017-04-30 DIAGNOSIS — F331 Major depressive disorder, recurrent, moderate: Secondary | ICD-10-CM | POA: Diagnosis not present

## 2017-04-30 DIAGNOSIS — I679 Cerebrovascular disease, unspecified: Secondary | ICD-10-CM | POA: Diagnosis not present

## 2017-04-30 DIAGNOSIS — I739 Peripheral vascular disease, unspecified: Secondary | ICD-10-CM | POA: Diagnosis not present

## 2017-04-30 DIAGNOSIS — D599 Acquired hemolytic anemia, unspecified: Secondary | ICD-10-CM | POA: Diagnosis not present

## 2017-04-30 DIAGNOSIS — R531 Weakness: Secondary | ICD-10-CM | POA: Diagnosis not present

## 2017-05-05 ENCOUNTER — Emergency Department
Admission: EM | Admit: 2017-05-05 | Discharge: 2017-05-05 | Disposition: A | Discharge status: Home / Self Care | Payer: Medicare Other | Attending: Emergency Medicine | Admitting: Emergency Medicine

## 2017-05-05 ENCOUNTER — Encounter: Payer: Self-pay | Admitting: *Deleted

## 2017-05-05 ENCOUNTER — Emergency Department: Payer: Medicare Other

## 2017-05-05 DIAGNOSIS — Z79899 Other long term (current) drug therapy: Secondary | ICD-10-CM | POA: Insufficient documentation

## 2017-05-05 DIAGNOSIS — R4781 Slurred speech: Secondary | ICD-10-CM | POA: Diagnosis present

## 2017-05-05 DIAGNOSIS — R258 Other abnormal involuntary movements: Secondary | ICD-10-CM | POA: Diagnosis not present

## 2017-05-05 DIAGNOSIS — Z91040 Latex allergy status: Secondary | ICD-10-CM | POA: Diagnosis not present

## 2017-05-05 DIAGNOSIS — R200 Anesthesia of skin: Secondary | ICD-10-CM | POA: Diagnosis not present

## 2017-05-05 DIAGNOSIS — G459 Transient cerebral ischemic attack, unspecified: Secondary | ICD-10-CM | POA: Diagnosis not present

## 2017-05-05 DIAGNOSIS — F419 Anxiety disorder, unspecified: Secondary | ICD-10-CM | POA: Diagnosis not present

## 2017-05-05 DIAGNOSIS — I10 Essential (primary) hypertension: Secondary | ICD-10-CM | POA: Diagnosis not present

## 2017-05-05 LAB — BASIC METABOLIC PANEL
Anion gap: 9 (ref 5–15)
BUN: 19 mg/dL (ref 6–20)
CO2: 25 mmol/L (ref 22–32)
Calcium: 9.5 mg/dL (ref 8.9–10.3)
Chloride: 106 mmol/L (ref 101–111)
Creatinine, Ser: 1.02 mg/dL — ABNORMAL HIGH (ref 0.44–1.00)
GFR calc Af Amer: 57 mL/min — ABNORMAL LOW (ref >60)
GFR calc non Af Amer: 49 mL/min — ABNORMAL LOW (ref >60)
Glucose, Bld: 118 mg/dL — ABNORMAL HIGH (ref 65–99)
Potassium: 3.2 mmol/L — ABNORMAL LOW (ref 3.5–5.1)
Sodium: 140 mmol/L (ref 135–145)

## 2017-05-05 LAB — CBC
HCT: 36.5 % (ref 35.0–47.0)
Hemoglobin: 11.7 g/dL — ABNORMAL LOW (ref 12.0–16.0)
MCH: 22 pg — ABNORMAL LOW (ref 26.0–34.0)
MCHC: 31.9 g/dL — ABNORMAL LOW (ref 32.0–36.0)
MCV: 69 fL — ABNORMAL LOW (ref 80.0–100.0)
Platelets: 143 10*3/uL — ABNORMAL LOW (ref 150–440)
RBC: 5.29 MIL/uL — ABNORMAL HIGH (ref 3.80–5.20)
RDW: 25.9 % — ABNORMAL HIGH (ref 11.5–14.5)
WBC: 6.5 10*3/uL (ref 3.6–11.0)

## 2017-05-05 MED ORDER — ASPIRIN 81 MG PO CHEW
324.0000 mg | CHEWABLE_TABLET | Freq: Once | ORAL | Status: AC
  Administered 2017-05-05: 324 mg via ORAL
  Filled 2017-05-05: qty 4

## 2017-05-05 NOTE — ED Notes (Signed)
This RN to bedside at this time. Apologized for delay, explained to patient that this RN spoke with MRI, according to MRI, approx 30-72mins before she will have MRI. Pt states understanding.

## 2017-05-05 NOTE — Discharge Instructions (Signed)
Fortunately today your MRI was normal. I do think you had another mini stroke. Please continue taking your aspirin daily and follow up with your primary care physician on Monday for repeat examination. Return to the emergency department sooner for any concerns.  It was a pleasure to take care of you today, and thank you for coming to our emergency department.  If you have any questions or concerns before leaving please ask the nurse to grab me and I'm more than happy to go through your aftercare instructions again.  If you were prescribed any opioid pain medication today such as Norco, Vicodin, Percocet, morphine, hydrocodone, or oxycodone please make sure you do not drive when you are taking this medication as it can alter your ability to drive safely.  If you have any concerns once you are home that you are not improving or are in fact getting worse before you can make it to your follow-up appointment, please do not hesitate to call 911 and come back for further evaluation.  Darel Hong, MD  Results for orders placed or performed during the hospital encounter of 67/61/95  Basic metabolic panel  Result Value Ref Range   Sodium 140 135 - 145 mmol/L   Potassium 3.2 (L) 3.5 - 5.1 mmol/L   Chloride 106 101 - 111 mmol/L   CO2 25 22 - 32 mmol/L   Glucose, Bld 118 (H) 65 - 99 mg/dL   BUN 19 6 - 20 mg/dL   Creatinine, Ser 1.02 (H) 0.44 - 1.00 mg/dL   Calcium 9.5 8.9 - 10.3 mg/dL   GFR calc non Af Amer 49 (L) >60 mL/min   GFR calc Af Amer 57 (L) >60 mL/min   Anion gap 9 5 - 15  CBC  Result Value Ref Range   WBC 6.5 3.6 - 11.0 K/uL   RBC 5.29 (H) 3.80 - 5.20 MIL/uL   Hemoglobin 11.7 (L) 12.0 - 16.0 g/dL   HCT 36.5 35.0 - 47.0 %   MCV 69.0 (L) 80.0 - 100.0 fL   MCH 22.0 (L) 26.0 - 34.0 pg   MCHC 31.9 (L) 32.0 - 36.0 g/dL   RDW 25.9 (H) 11.5 - 14.5 %   Platelets 143 (L) 150 - 440 K/uL   Ct Head Wo Contrast  Result Date: 05/05/2017 CLINICAL DATA:  Bilateral upper extremity tingling.  EXAM: CT HEAD WITHOUT CONTRAST TECHNIQUE: Contiguous axial images were obtained from the base of the skull through the vertex without intravenous contrast. COMPARISON:  CT scan of March 23, 2017. FINDINGS: Brain: Mild chronic ischemic white matter disease is noted. No mass effect or midline shift is noted. Ventricular size is within normal limits. There is no evidence of mass lesion, hemorrhage or acute infarction. Old right cerebellar infarction is noted. Vascular: No hyperdense vessel or unexpected calcification. Skull: Normal. Negative for fracture or focal lesion. Sinuses/Orbits: No acute finding. Other: None. IMPRESSION: Mild chronic ischemic white matter disease. No acute intracranial abnormality seen. Left-sided hemorrhage noted on prior exam is no longer present. Electronically Signed   By: Marijo Conception, M.D.   On: 05/05/2017 13:17   Mr Brain Wo Contrast (neuro Protocol)  Result Date: 05/05/2017 CLINICAL DATA:  Initial evaluation for acute numbness around mouth. History of previous intracranial hemorrhage. EXAM: MRI HEAD WITHOUT CONTRAST TECHNIQUE: Multiplanar, multiecho pulse sequences of the brain and surrounding structures were obtained without intravenous contrast. COMPARISON:  Comparison made with prior CT from earlier same day as well as previous MRI from 01/29/2015. FINDINGS:  Brain: Diffuse prominence of the CSF containing spaces compatible with generalized age-related cerebral atrophy. Encephalomalacia within the anterior right frontal lobe consistent with remote infarct. Multiple scatter remote lacunar infarcts present within the bilateral corona radiata/centrum semi ovale. Remote hemorrhagic lacunar infarcts involve the basal ganglia bilaterally. Scatter remote bilateral cerebellar infarcts. Underlying extensive chronic microvascular ischemic changes. No abnormal foci of restricted diffusion to suggest acute or subacute ischemia. Gray-white matter differentiation maintained. No evidence for  acute intracranial hemorrhage. No mass lesion, midline shift or mass effect. No hydrocephalus. No extra-axial fluid collection. Major dural sinuses are grossly patent. FLAIR signal intensity within the left transverse sinus felt to be related to slow flow. Incidental note made of an empty sella. Vascular: Major intracranial vascular flow voids maintained. Skull and upper cervical spine: Craniocervical junction within normal limits. Upper cervical spine unremarkable. Bone marrow signal intensity within normal limits. Hyperostosis frontalis interna noted. Scalp soft tissues within normal limits. Sinuses/Orbits: Globes and orbital soft tissues within normal limits. Patient status post lens extraction bilaterally. Scattered mucosal thickening within the ethmoidal air cells. Paranasal sinuses are otherwise clear. Trace right mastoid effusion. Inner ear structures normal. Other: 6 mm T2 hyperintense lesion noted within the right parotid gland, indeterminate, but stable from prior, and of doubtful significance. IMPRESSION: 1. No acute intracranial abnormality. 2. Generalized cerebral atrophy with multiple remote ischemic infarcts and extensive underlying chronic microvascular ischemic disease, similar to previous. Electronically Signed   By: Jeannine Boga M.D.   On: 05/05/2017 20:30

## 2017-05-05 NOTE — ED Triage Notes (Addendum)
States at 1130 she began to feel "nervous" with her hands shaking and numbness around her mouth, at present pt states shaking has stopped but pt states numbness around her entire mouth is present, pt states she can not talk but when asked questions she answers appropriately in a  Whisper, hx of brain bleed in the past, pt is currently being treated for leukemia and had a blood transfusion last week

## 2017-05-05 NOTE — ED Notes (Signed)
This RN and MD to bedside at this time. Pt states this morning was making breakfast when she felt like her hands became very shaky and felt "nervous". Pt states she felt like her lips became numb and she "lost her speech". Pt was dx with a hemorrhagic stroke in July and was sent to [facility]. Pt states hx of mini strokes as well. Pt is neurologically intact at this time.

## 2017-05-05 NOTE — ED Notes (Signed)
This RN to bedside at this time. Apologized for delay in coming to room, explained with a critical patient. Pt states understanding. Pt requesting graham crackers, chocolate milk, and graham crackers and apple juice for her husband. Pt is alert and oriented, no change in patient condition. Will continue to monitor for further patient needs.

## 2017-05-05 NOTE — ED Notes (Signed)
Pt changed and placed into clean brief due to urinary incontinence. Pt also given phone to speak with MRI. Pt states "what are you going to do about my BP?" This RN explained would notify MD about hypertension.

## 2017-05-05 NOTE — Progress Notes (Signed)
While rounding the ED, [facility] visit with pt and pt's husband was at bedside. Stephanie Harris complained about the time it took for her to seen by a doctor. Pt was upset and states that something needs to be done about this to quicken the admission process so that pt's with serious health conditions are seen sooner than later. Stephanie Harris apologized for what had happened and assured pt that the hospital is trying to shorten the waiting time before a pt is admitted.    05/05/17 1600  Clinical Encounter Type  Visited With Patient  Visit Type Initial;Other (Comment)  Referral From Chaplain  Consult/Referral To Chaplain  Spiritual Encounters  Spiritual Needs Prayer;Emotional;Other (Comment)

## 2017-05-05 NOTE — ED Notes (Signed)
Pt requesting phone to use to make a phone call.

## 2017-05-05 NOTE — ED Provider Notes (Signed)
[facility] Emergency Department Provider Note  ____________________________________________   First MD Initiated Contact with Patient 05/05/17 1503     (approximate)  I have reviewed the triage vital signs and the nursing notes.   HISTORY  Chief Complaint Numbness and Shaking    HPI Stephanie Harris is a 83 y.o. female who comes to the emergency department after having an episode of "lost speech" and slurred speech along with hand shaking and anxiety that began this morning. I actually diagnosed the patient with the hypertensive intracerebral hemorrhage roughly 6 weeks ago and transferred her to [facility]. She has done well after the event. Her symptoms are currently resolved. She has no pain. Nothing seemed to make him come on or go away.  2016 Echo: INTERPRETATION NORMAL LEFT VENTRICULAR SYSTOLIC FUNCTION WITH MODERATE LVH MODERATE VALVULAR REGURGITATION (See above) NO VALVULAR STENOSIS MILD PHTN   Past Medical History:  Diagnosis Date  . CML (chronic myelocytic leukemia) ([place])    Onc at [facility]  . Heart disease   . Hypertension   . Leukemia ([place])   . RA (rheumatoid arthritis) ([place])   . Stroke ([place])   . Thalassemia     Patient Active Problem List   Diagnosis Date Noted  . Closed fracture of proximal end of left humerus with routine healing 08/28/2016  . Acute cystitis 04/28/2016  . Chest pain 03/14/2015  . TIA (transient ischemic attack) 01/29/2015  . Allergic arthritis, hand     Past Surgical History:  Procedure Laterality Date  . ABDOMINAL HYSTERECTOMY    . CAROTID ENDARTERECTOMY Left   . CATARACT EXTRACTION    . CHOLECYSTECTOMY    . PERCUTANEOUS CORONARY STENT INTERVENTION (PCI-S)    . ROTATOR CUFF REPAIR Left   . TOTAL KNEE ARTHROPLASTY Right     Prior to Admission medications   Medication Sig Start Date End Date Taking? Authorizing Provider  ascorbic acid (VITAMIN C) 1000 MG tablet Take 1,000 mg by mouth daily.   Yes  [provider]  aspirin 81 MG chewable tablet Chew by mouth daily.   Yes [provider]  carvedilol (COREG) 3.125 MG tablet Take 6.25 mg by mouth 2 (two) times daily.    Yes [provider]  cholecalciferol (VITAMIN D) 1000 UNITS tablet Take 1,000 Units by mouth daily.   Yes [provider]  cyanocobalamin (,VITAMIN B-12,) 1000 MCG/ML injection Inject 1,000 mcg into the muscle every 30 (thirty) days.   Yes [provider]  epoetin alfa (EPOGEN,PROCRIT) 65035 UNIT/ML injection Inject 10,000 Units into the skin every 14 (fourteen) days. 04/08/17  Yes [provider]  folic acid (FOLVITE) 1 MG tablet Take 1 mg by mouth daily.   Yes [provider]  levothyroxine (SYNTHROID, LEVOTHROID) 50 MCG tablet Take 50 mcg by mouth daily. On the opposite days from taking 89mcg.   Yes [provider]  vitamin E 400 UNIT capsule Take 400 Units by mouth daily.   Yes [provider]  HYDROcodone-acetaminophen (NORCO/VICODIN) 5-325 MG tablet Take 1 tablet by mouth every 4 (four) hours as needed for moderate pain. Patient not taking: Reported on 08/28/2016 07/08/16 07/08/17  [name], MD  ondansetron (ZOFRAN ODT) 4 MG disintegrating tablet Take 1 tablet (4 mg total) by mouth every 8 (eight) hours as needed for nausea or vomiting. Patient not taking: Reported on 08/28/2016 07/08/16   [name], MD  saccharomyces boulardii (FLORASTOR) 250 MG capsule Take 1 capsule (250 mg total) by mouth 2 (  two) times daily. Patient not taking: Reported on 05/05/2017 01/30/15   [name], MD    Allergies Penicillins and Latex  Family History  Problem Relation Age of Onset  . Acute myelogenous leukemia Brother   . Stroke Mother   . Heart attack Father     Social History Social History  Substance Use Topics  . Smoking status: Never Smoker  . Smokeless tobacco: Never Used  . Alcohol use No    Review of Systems Constitutional:  No fever/chills Eyes: No visual changes. ENT: No sore throat. Cardiovascular: Denies chest pain. Respiratory: Denies shortness of breath. Gastrointestinal: No abdominal pain.  No nausea, no vomiting.  No diarrhea.  No constipation. Genitourinary: Negative for dysuria. Musculoskeletal: Negative for back pain. Skin: Negative for rash. Neurological: Negative for headaches, positive for numbness or weakness   ____________________________________________   PHYSICAL EXAM:  VITAL SIGNS: ED Triage Vitals  Enc Vitals Group     BP 05/05/17 1235 (!) 161/79     Pulse Rate 05/05/17 1235 83     Resp 05/05/17 1235 18     Temp 05/05/17 1241 98.3 F (36.8 C)     Temp Source 05/05/17 1241 Oral     SpO2 05/05/17 1235 99 %     Weight 05/05/17 1241 97 lb (44 kg)     Height 05/05/17 1241 5' (1.524 m)     Head Circumference --      Peak Flow --      Pain Score 05/05/17 1234 0     Pain Loc --      Pain Edu? --      Excl. in [place]? --     Constitutional: Alert and oriented 4 well appearing Eyes: PERRL EOMI. Head: Atraumatic. Nose: No congestion/rhinnorhea. Mouth/Throat: No trismus Neck: No stridor.   Cardiovascular: Normal rate, regular rhythm. Grossly normal heart sounds.  Good peripheral circulation. Respiratory: Normal respiratory effort.  No retractions. Lungs CTAB and moving good air Gastrointestinal: Soft nontender Musculoskeletal: No lower extremity edema   Neurologic:  Alert and oriented 4 Cranial nerves II through XII intact No pronator drift 5 out of 5 grips, biceps, triceps, hip flexion, hip extension plantar flexion, dorsiflexion Sensation intact to light touch throughout 2+ DTRs and no ankle clonus Normal finger-nose-finger Ambulates with steady gait  Skin:  Skin is warm, dry and intact. No rash noted. Psychiatric: Mood and affect are normal. Speech and behavior are normal.    ____________________________________________   DIFFERENTIAL includes but not limited  to  Stroke, TIA, intracerebral hemorrhage, metabolic derangement, recrudescence ____________________________________________   LABS (all labs ordered are listed, but only abnormal results are displayed)  Labs Reviewed  BASIC METABOLIC PANEL - Abnormal; Notable for the following:       Result Value   Potassium 3.2 (*)    Glucose, Bld 118 (*)    Creatinine, Ser 1.02 (*)    GFR calc non Af Amer 49 (*)    GFR calc Af Amer 57 (*)    All other components within normal limits  CBC - Abnormal; Notable for the following:    RBC 5.29 (*)    Hemoglobin 11.7 (*)    MCV 69.0 (*)    MCH 22.0 (*)    MCHC 31.9 (*)    RDW 25.9 (*)    Platelets 143 (*)    All other components within normal limits  CBG MONITORING, ED    Blood work unremarkable __________________________________________  EKG  ED ECG REPORT I, Darel Hong, the  attending physician, personally viewed and interpreted this ECG.  Date: 05/05/2017 EKG Time:  Rate: 85 Rhythm: normal sinus rhythm QRS Axis: leftward axis Intervals: normal ST/T Wave abnormalities: normal Narrative Interpretation: no evidence of acute ischemia. Borderline EKG  ____________________________________________  RADIOLOGY  Head CT with chronic changes but no acute disease noted ____________________________________________   PROCEDURES  Procedure(s) performed: no  Procedures  Critical Care performed: no  Observation: no ____________________________________________   INITIAL IMPRESSION / ASSESSMENT AND PLAN / ED COURSE  Pertinent labs & imaging results that were available during my care of the patient were reviewed by me and considered in my medical decision making (see chart for details).  On arrival the patient is very well-appearing and currently neurologically intact. Her head CT shows no acute bleed. Her history is concerning for recurrent transient ischemic attack with slurred speech and weakness. We'll get an MRI now and  reevaluate. She is currently taking 81 mg aspirin a day but she may require dual antiplatelet therapy versus true anticoagulation.     Fortunately the patient's MRI is negative for acute infarct. She likely had another transient ischemic attack. She recently had a GI bleed I will not initiate any anticoagulation but we'll have her continue her aspirin and follow up with her primary care physician. She is discharged home in improved condition. ____________________________________________   FINAL CLINICAL IMPRESSION(S) / ED DIAGNOSES  Final diagnoses:  Transient cerebral ischemia, unspecified type      NEW MEDICATIONS STARTED DURING THIS VISIT:  Discharge Medication List as of 05/05/2017  9:09 PM       Note:  This document was prepared using Dragon voice recognition software and may include unintentional dictation errors.     Darel Hong, MD 05/05/17 2322

## 2017-05-18 DIAGNOSIS — I252 Old myocardial infarction: Secondary | ICD-10-CM | POA: Diagnosis not present

## 2017-05-18 DIAGNOSIS — D649 Anemia, unspecified: Secondary | ICD-10-CM | POA: Diagnosis not present

## 2017-05-18 DIAGNOSIS — Z7984 Long term (current) use of oral hypoglycemic drugs: Secondary | ICD-10-CM | POA: Diagnosis not present

## 2017-05-18 DIAGNOSIS — Z8673 Personal history of transient ischemic attack (TIA), and cerebral infarction without residual deficits: Secondary | ICD-10-CM | POA: Diagnosis not present

## 2017-05-18 DIAGNOSIS — N189 Chronic kidney disease, unspecified: Secondary | ICD-10-CM | POA: Diagnosis not present

## 2017-05-18 DIAGNOSIS — I610 Nontraumatic intracerebral hemorrhage in hemisphere, subcortical: Secondary | ICD-10-CM | POA: Diagnosis not present

## 2017-05-18 DIAGNOSIS — I129 Hypertensive chronic kidney disease with stage 1 through stage 4 chronic kidney disease, or unspecified chronic kidney disease: Secondary | ICD-10-CM | POA: Diagnosis not present

## 2017-05-18 DIAGNOSIS — C91Z0 Other lymphoid leukemia not having achieved remission: Secondary | ICD-10-CM | POA: Diagnosis not present

## 2017-05-18 DIAGNOSIS — E1122 Type 2 diabetes mellitus with diabetic chronic kidney disease: Secondary | ICD-10-CM | POA: Diagnosis not present

## 2017-05-18 DIAGNOSIS — Z79899 Other long term (current) drug therapy: Secondary | ICD-10-CM | POA: Diagnosis not present

## 2017-05-18 DIAGNOSIS — D638 Anemia in other chronic diseases classified elsewhere: Secondary | ICD-10-CM | POA: Diagnosis not present

## 2017-05-25 ENCOUNTER — Telehealth: Payer: Self-pay | Admitting: Adult Health Nurse Practitioner

## 2017-05-25 NOTE — Telephone Encounter (Signed)
Call back- Stephanie Harris broke his hip and had emergency surgery Sunday.

## 2017-06-02 DIAGNOSIS — N183 Chronic kidney disease, stage 3 unspecified: Secondary | ICD-10-CM | POA: Insufficient documentation

## 2017-06-02 DIAGNOSIS — D631 Anemia in chronic kidney disease: Secondary | ICD-10-CM | POA: Insufficient documentation

## 2017-06-03 DIAGNOSIS — C9110 Chronic lymphocytic leukemia of B-cell type not having achieved remission: Secondary | ICD-10-CM | POA: Diagnosis not present

## 2017-06-03 DIAGNOSIS — C91Z0 Other lymphoid leukemia not having achieved remission: Secondary | ICD-10-CM | POA: Diagnosis not present

## 2017-06-03 DIAGNOSIS — D563 Thalassemia minor: Secondary | ICD-10-CM | POA: Diagnosis not present

## 2017-06-03 DIAGNOSIS — Z23 Encounter for immunization: Secondary | ICD-10-CM | POA: Diagnosis not present

## 2017-06-03 DIAGNOSIS — N183 Chronic kidney disease, stage 3 (moderate): Secondary | ICD-10-CM | POA: Diagnosis not present

## 2017-06-03 DIAGNOSIS — Z79899 Other long term (current) drug therapy: Secondary | ICD-10-CM | POA: Diagnosis not present

## 2017-06-03 DIAGNOSIS — Z87820 Personal history of traumatic brain injury: Secondary | ICD-10-CM | POA: Diagnosis not present

## 2017-06-03 DIAGNOSIS — D631 Anemia in chronic kidney disease: Secondary | ICD-10-CM | POA: Diagnosis not present

## 2017-06-03 DIAGNOSIS — D630 Anemia in neoplastic disease: Secondary | ICD-10-CM | POA: Diagnosis not present

## 2017-06-21 DIAGNOSIS — Z79899 Other long term (current) drug therapy: Secondary | ICD-10-CM | POA: Diagnosis not present

## 2017-06-21 DIAGNOSIS — N183 Chronic kidney disease, stage 3 (moderate): Secondary | ICD-10-CM | POA: Diagnosis not present

## 2017-06-21 DIAGNOSIS — D630 Anemia in neoplastic disease: Secondary | ICD-10-CM | POA: Diagnosis not present

## 2017-06-21 DIAGNOSIS — I129 Hypertensive chronic kidney disease with stage 1 through stage 4 chronic kidney disease, or unspecified chronic kidney disease: Secondary | ICD-10-CM | POA: Diagnosis not present

## 2017-06-21 DIAGNOSIS — C9110 Chronic lymphocytic leukemia of B-cell type not having achieved remission: Secondary | ICD-10-CM | POA: Diagnosis not present

## 2017-06-21 DIAGNOSIS — D631 Anemia in chronic kidney disease: Secondary | ICD-10-CM | POA: Diagnosis not present

## 2017-07-01 DIAGNOSIS — D631 Anemia in chronic kidney disease: Secondary | ICD-10-CM | POA: Diagnosis not present

## 2017-07-01 DIAGNOSIS — D630 Anemia in neoplastic disease: Secondary | ICD-10-CM | POA: Diagnosis not present

## 2017-07-01 DIAGNOSIS — N183 Chronic kidney disease, stage 3 (moderate): Secondary | ICD-10-CM | POA: Diagnosis not present

## 2017-07-01 DIAGNOSIS — C9110 Chronic lymphocytic leukemia of B-cell type not having achieved remission: Secondary | ICD-10-CM | POA: Diagnosis not present

## 2017-07-28 ENCOUNTER — Encounter: Payer: Self-pay | Admitting: Emergency Medicine

## 2017-07-28 ENCOUNTER — Emergency Department
Admission: EM | Admit: 2017-07-28 | Discharge: 2017-07-28 | Disposition: A | Discharge status: Home / Self Care | Payer: Medicare Other | Attending: Emergency Medicine | Admitting: Emergency Medicine

## 2017-07-28 ENCOUNTER — Emergency Department: Payer: Medicare Other

## 2017-07-28 DIAGNOSIS — Y999 Unspecified external cause status: Secondary | ICD-10-CM | POA: Diagnosis not present

## 2017-07-28 DIAGNOSIS — Y929 Unspecified place or not applicable: Secondary | ICD-10-CM | POA: Insufficient documentation

## 2017-07-28 DIAGNOSIS — M546 Pain in thoracic spine: Secondary | ICD-10-CM | POA: Diagnosis not present

## 2017-07-28 DIAGNOSIS — S32010A Wedge compression fracture of first lumbar vertebra, initial encounter for closed fracture: Secondary | ICD-10-CM

## 2017-07-28 DIAGNOSIS — S3992XA Unspecified injury of lower back, initial encounter: Secondary | ICD-10-CM | POA: Diagnosis not present

## 2017-07-28 DIAGNOSIS — Y939 Activity, unspecified: Secondary | ICD-10-CM | POA: Diagnosis not present

## 2017-07-28 DIAGNOSIS — I10 Essential (primary) hypertension: Secondary | ICD-10-CM | POA: Diagnosis not present

## 2017-07-28 DIAGNOSIS — E860 Dehydration: Secondary | ICD-10-CM | POA: Insufficient documentation

## 2017-07-28 DIAGNOSIS — M545 Low back pain: Secondary | ICD-10-CM | POA: Diagnosis present

## 2017-07-28 DIAGNOSIS — X500XXA Overexertion from strenuous movement or load, initial encounter: Secondary | ICD-10-CM | POA: Diagnosis not present

## 2017-07-28 DIAGNOSIS — R112 Nausea with vomiting, unspecified: Secondary | ICD-10-CM | POA: Insufficient documentation

## 2017-07-28 DIAGNOSIS — Z8673 Personal history of transient ischemic attack (TIA), and cerebral infarction without residual deficits: Secondary | ICD-10-CM | POA: Diagnosis not present

## 2017-07-28 DIAGNOSIS — Z91040 Latex allergy status: Secondary | ICD-10-CM | POA: Insufficient documentation

## 2017-07-28 DIAGNOSIS — R102 Pelvic and perineal pain: Secondary | ICD-10-CM | POA: Diagnosis not present

## 2017-07-28 DIAGNOSIS — S3993XA Unspecified injury of pelvis, initial encounter: Secondary | ICD-10-CM | POA: Diagnosis not present

## 2017-07-28 LAB — COMPREHENSIVE METABOLIC PANEL
ALT: 15 U/L (ref 14–54)
AST: 24 U/L (ref 15–41)
Albumin: 4 g/dL (ref 3.5–5.0)
Alkaline Phosphatase: 34 U/L — ABNORMAL LOW (ref 38–126)
Anion gap: 9 (ref 5–15)
BUN: 28 mg/dL — ABNORMAL HIGH (ref 6–20)
CO2: 24 mmol/L (ref 22–32)
Calcium: 10 mg/dL (ref 8.9–10.3)
Chloride: 107 mmol/L (ref 101–111)
Creatinine, Ser: 1.17 mg/dL — ABNORMAL HIGH (ref 0.44–1.00)
GFR calc Af Amer: 48 mL/min — ABNORMAL LOW (ref >60)
GFR calc non Af Amer: 42 mL/min — ABNORMAL LOW (ref >60)
Glucose, Bld: 126 mg/dL — ABNORMAL HIGH (ref 65–99)
Potassium: 4.3 mmol/L (ref 3.5–5.1)
Sodium: 140 mmol/L (ref 135–145)
Total Bilirubin: 1.7 mg/dL — ABNORMAL HIGH (ref 0.3–1.2)
Total Protein: 7.7 g/dL (ref 6.5–8.1)

## 2017-07-28 LAB — CBC WITH DIFFERENTIAL/PLATELET
Basophils Absolute: 0 10*3/uL (ref 0–0.1)
Basophils Relative: 1 %
Eosinophils Absolute: 0.2 10*3/uL (ref 0–0.7)
Eosinophils Relative: 4 %
HCT: 32.3 % — ABNORMAL LOW (ref 35.0–47.0)
Hemoglobin: 10.4 g/dL — ABNORMAL LOW (ref 12.0–16.0)
Lymphocytes Relative: 44 %
Lymphs Abs: 2.5 10*3/uL (ref 1.0–3.6)
MCH: 20.2 pg — ABNORMAL LOW (ref 26.0–34.0)
MCHC: 32.3 g/dL (ref 32.0–36.0)
MCV: 62.6 fL — ABNORMAL LOW (ref 80.0–100.0)
Monocytes Absolute: 0.7 10*3/uL (ref 0.2–0.9)
Monocytes Relative: 13 %
Neutro Abs: 2 10*3/uL (ref 1.4–6.5)
Neutrophils Relative %: 38 %
Platelets: 156 10*3/uL (ref 150–440)
RBC: 5.16 MIL/uL (ref 3.80–5.20)
RDW: 17.7 % — ABNORMAL HIGH (ref 11.5–14.5)
WBC: 5.4 10*3/uL (ref 3.6–11.0)

## 2017-07-28 LAB — URINALYSIS, COMPLETE (UACMP) WITH MICROSCOPIC
Bacteria, UA: NONE SEEN
Bilirubin Urine: NEGATIVE
Glucose, UA: NEGATIVE mg/dL
Ketones, ur: NEGATIVE mg/dL
Leukocytes, UA: NEGATIVE
Nitrite: NEGATIVE
Protein, ur: NEGATIVE mg/dL
Specific Gravity, Urine: 1.008 (ref 1.005–1.030)
pH: 5 (ref 5.0–8.0)

## 2017-07-28 LAB — LIPASE, BLOOD: Lipase: 22 U/L (ref 11–51)

## 2017-07-28 LAB — TROPONIN I: Troponin I: <0.03 ng/mL (ref <0.03)

## 2017-07-28 MED ORDER — SODIUM CHLORIDE 0.9 % IV SOLN
Freq: Once | INTRAVENOUS | Status: AC
  Administered 2017-07-28: 18:00:00 via INTRAVENOUS

## 2017-07-28 MED ORDER — MORPHINE SULFATE (PF) 4 MG/ML IV SOLN
INTRAVENOUS | Status: AC
  Administered 2017-07-28: 4 mg via INTRAVENOUS
  Filled 2017-07-28: qty 1

## 2017-07-28 MED ORDER — ONDANSETRON HCL 4 MG/2ML IJ SOLN
4.0000 mg | Freq: Once | INTRAMUSCULAR | Status: AC
  Administered 2017-07-28: 4 mg via INTRAVENOUS

## 2017-07-28 MED ORDER — OXYCODONE-ACETAMINOPHEN 5-325 MG PO TABS: 1.0000 | ORAL_TABLET | Freq: Three times a day (TID) | ORAL | 0 refills | Status: DC | PRN

## 2017-07-28 MED ORDER — MORPHINE SULFATE (PF) 4 MG/ML IV SOLN
4.0000 mg | Freq: Once | INTRAVENOUS | Status: AC
  Administered 2017-07-28: 4 mg via INTRAVENOUS

## 2017-07-28 MED ORDER — DOCUSATE SODIUM 100 MG PO CAPS: 100.0000 mg | ORAL_CAPSULE | Freq: Every day | ORAL | 2 refills | Status: DC | PRN

## 2017-07-28 MED ORDER — ONDANSETRON HCL 4 MG/2ML IJ SOLN
INTRAMUSCULAR | Status: AC
  Administered 2017-07-28: 4 mg via INTRAVENOUS
  Filled 2017-07-28: qty 2

## 2017-07-28 MED ORDER — ONDANSETRON 4 MG PO TBDP: 4.0000 mg | ORAL_TABLET | Freq: Three times a day (TID) | ORAL | 0 refills | Status: DC | PRN

## 2017-07-28 NOTE — ED Triage Notes (Addendum)
Pt to ED via POV with c/o back pain. Pt states last week was lifting " ham out of the oven and felt like I hurt my back" . Pt states she also had mechanical fall onto chair a few days later and re injured her back. PT at baseline uses walker to ambulate. Pt A&Ox4, NAD noted

## 2017-07-28 NOTE — ED Provider Notes (Signed)
[facility] Emergency Department Provider Note       Time seen: ----------------------------------------- 5:23 PM on 07/28/2017 -----------------------------------------   I have reviewed the triage vital signs and the nursing notes.  HISTORY   Chief Complaint Back Pain    HPI [facility] is a 83 y.o. female with a history of CML, hypertension and CVA who presents to the ED for back pain.  Patient states last week she was lifting him out of the evident felt like she hurt her back.  She has pain all the way across her low back and into her hips.  She also states she had a mechanical fall recently.  She typically uses a walker to ambulate.  She also describes vomiting with me today.  She feels like she may have gotten dehydrated.  Past Medical History:  Diagnosis Date  . CML (chronic myelocytic leukemia) ([place])    Onc at [facility]  . Heart disease   . Hypertension   . Leukemia ([place])   . RA (rheumatoid arthritis) ([place])   . Stroke ([place])   . Thalassemia     Patient Active Problem List   Diagnosis Date Noted  . Closed fracture of proximal end of left humerus with routine healing 08/28/2016  . Acute cystitis 04/28/2016  . Chest pain 03/14/2015  . TIA (transient ischemic attack) 01/29/2015  . Allergic arthritis, hand     Past Surgical History:  Procedure Laterality Date  . ABDOMINAL HYSTERECTOMY    . CAROTID ENDARTERECTOMY Left   . CATARACT EXTRACTION    . CHOLECYSTECTOMY    . PERCUTANEOUS CORONARY STENT INTERVENTION (PCI-S)    . ROTATOR CUFF REPAIR Left   . TOTAL KNEE ARTHROPLASTY Right     Allergies Penicillins and Latex  Social History Social History   Tobacco Use  . Smoking status: Never Smoker  . Smokeless tobacco: Never Used  Substance Use Topics  . Alcohol use: No  . Drug use: No    Review of Systems Constitutional: Negative for fever. Cardiovascular: Negative for chest pain. Respiratory: Negative for shortness of  breath. Gastrointestinal: Negative for abdominal pain, positive for vomiting Genitourinary: Negative for dysuria. Musculoskeletal: Positive for back pain Skin: Negative for rash. Neurological: Negative for headaches, focal weakness or numbness.  All systems negative/normal/unremarkable except as stated in the HPI  ____________________________________________   PHYSICAL EXAM:  VITAL SIGNS: ED Triage Vitals  Enc Vitals Group     BP 07/28/17 1647 (!) 193/59     Pulse Rate 07/28/17 1647 68     Resp 07/28/17 1647 16     Temp 07/28/17 1647 97.6 F (36.4 C)     Temp Source 07/28/17 1647 Oral     SpO2 07/28/17 1647 98 %     Weight 07/28/17 1647 103 lb (46.7 kg)     Height 07/28/17 1647 5' (1.524 m)     Head Circumference --      Peak Flow --      Pain Score 07/28/17 1646 10     Pain Loc --      Pain Edu? --      Excl. in [place]? --    Constitutional: Alert and oriented. Well appearing and in no distress. Eyes: Conjunctivae are normal. Normal extraocular movements. ENT   Head: Normocephalic and atraumatic.   Nose: No congestion/rhinnorhea.   Mouth/Throat: Mucous membranes are moist.   Neck: No stridor. Cardiovascular: Normal rate, regular rhythm. No murmurs, rubs, or gallops. Respiratory: Normal respiratory effort without tachypnea nor  retractions. Breath sounds are clear and equal bilaterally. No wheezes/rales/rhonchi. Gastrointestinal: Soft and nontender. Normal bowel sounds Musculoskeletal: Nontender with normal range of motion in extremities.  Diffuse tenderness across the low back. Neurologic:  Normal speech and language. No gross focal neurologic deficits are appreciated.  Skin:  Skin is warm, dry and intact. No rash noted. Psychiatric: Mood and affect are normal. Speech and behavior are normal.  ___________________________________________  ED COURSE:  Pertinent labs & imaging results that were available during my care of the patient were reviewed by me and  considered in my medical decision making (see chart for details). Patient presents for back pain and vomiting, we will assess with labs and imaging as indicated.   Procedures ____________________________________________   LABS (pertinent positives/negatives)  Labs Reviewed  CBC WITH DIFFERENTIAL/PLATELET - Abnormal; Notable for the following components:      Result Value   Hemoglobin 10.4 (*)    HCT 32.3 (*)    MCV 62.6 (*)    MCH 20.2 (*)    RDW 17.7 (*)    All other components within normal limits  COMPREHENSIVE METABOLIC PANEL - Abnormal; Notable for the following components:   Glucose, Bld 126 (*)    BUN 28 (*)    Creatinine, Ser 1.17 (*)    Alkaline Phosphatase 34 (*)    Total Bilirubin 1.7 (*)    GFR calc non Af Amer 42 (*)    GFR calc Af Amer 48 (*)    All other components within normal limits  URINALYSIS, COMPLETE (UACMP) WITH MICROSCOPIC - Abnormal; Notable for the following components:   Color, Urine YELLOW (*)    APPearance CLEAR (*)    Hgb urine dipstick SMALL (*)    Squamous Epithelial / LPF 0-5 (*)    All other components within normal limits  LIPASE, BLOOD  TROPONIN I    RADIOLOGY Images were viewed by me  Thoracic, lumbar spine, pelvis x-rays IMPRESSION: 1. Superior endplate T11 compression fracture is stable. There is slight retropulsed bone. 2. No other acute fractures or significant interval change and exaggerated kyphosis. 3. Aortic Atherosclerosis (ICD10-I70.0). IMPRESSION: 1. Remote T11 compression fracture is stable. 2. Possible superior endplate fracture at L1 is new since the prior exam. 3. No other acute fractures. 4. Aortic Atherosclerosis (ICD10-I70.0). IMPRESSION: 1. No acute or healing fracture is evident on this single view. 2. Degenerative changes in the SI joints and hips bilaterally. 3. Aortic atherosclerosis. ____________________________________________  DIFFERENTIAL DIAGNOSIS   Muscle strain, fracture, contusion,  electrolyte abnormality, dehydration  FINAL ASSESSMENT AND PLAN  Vomiting, L1 superior endplate fracture, dehydration   Plan: Patient had presented for back pain and vomiting. Patient's labs did reveal likely mild dehydration. Patient's imaging was concerning for a small superior endplate fracture at L1.  She was aware of the T11 compression fracture which is stable.  She did improve with pain medicine here and she will be discharged with pain medicine and stool softeners.  She will be referred to her primary care doctor for outpatient follow-up.   [name], MD   Note: This note was generated in part or whole with voice recognition software. Voice recognition is usually quite accurate but there are transcription errors that can and very often do occur. I apologize for any typographical errors that were not detected and corrected.     [name], MD 07/28/17 [phone]

## 2017-07-28 NOTE — ED Notes (Signed)
Pt assisted into wheelchair and taken to car.

## 2017-07-30 DIAGNOSIS — M545 Low back pain: Secondary | ICD-10-CM | POA: Insufficient documentation

## 2017-07-30 DIAGNOSIS — R109 Unspecified abdominal pain: Secondary | ICD-10-CM | POA: Diagnosis not present

## 2017-07-30 DIAGNOSIS — S32010D Wedge compression fracture of first lumbar vertebra, subsequent encounter for fracture with routine healing: Secondary | ICD-10-CM | POA: Diagnosis not present

## 2017-07-30 DIAGNOSIS — Z91040 Latex allergy status: Secondary | ICD-10-CM | POA: Insufficient documentation

## 2017-07-30 DIAGNOSIS — Z79899 Other long term (current) drug therapy: Secondary | ICD-10-CM | POA: Diagnosis not present

## 2017-07-30 DIAGNOSIS — I700 Atherosclerosis of aorta: Secondary | ICD-10-CM | POA: Diagnosis not present

## 2017-07-30 DIAGNOSIS — Z8673 Personal history of transient ischemic attack (TIA), and cerebral infarction without residual deficits: Secondary | ICD-10-CM | POA: Diagnosis not present

## 2017-07-30 DIAGNOSIS — M810 Age-related osteoporosis without current pathological fracture: Secondary | ICD-10-CM | POA: Diagnosis not present

## 2017-07-30 DIAGNOSIS — M8088XA Other osteoporosis with current pathological fracture, vertebra(e), initial encounter for fracture: Secondary | ICD-10-CM | POA: Diagnosis not present

## 2017-07-30 DIAGNOSIS — Z7982 Long term (current) use of aspirin: Secondary | ICD-10-CM | POA: Diagnosis not present

## 2017-07-30 DIAGNOSIS — I10 Essential (primary) hypertension: Secondary | ICD-10-CM | POA: Diagnosis not present

## 2017-07-30 MED ORDER — FENTANYL CITRATE (PF) 100 MCG/2ML IJ SOLN
INTRAMUSCULAR | Status: AC
  Filled 2017-07-30: qty 2

## 2017-07-30 MED ORDER — FENTANYL CITRATE (PF) 100 MCG/2ML IJ SOLN
50.0000 ug | INTRAMUSCULAR | Status: DC | PRN
  Administered 2017-07-30: 50 ug via NASAL

## 2017-07-30 NOTE — ED Triage Notes (Signed)
Patient sitting in wheelchair in no distress at this time. Patient given a warm blanket.

## 2017-07-30 NOTE — ED Triage Notes (Signed)
Patient reports being seen in Wednesday in this ED and dx with compound fracture in lower back.   Patient c/o lower back pain rated 10 out of 10. Patient was prescribed percocet. Patient took last dose at 1700. Patient reports no relief with percocet.

## 2017-07-31 ENCOUNTER — Emergency Department
Admission: EM | Admit: 2017-07-31 | Discharge: 2017-07-31 | Disposition: A | Discharge status: Home / Self Care | Payer: Medicare Other | Attending: Emergency Medicine | Admitting: Emergency Medicine

## 2017-07-31 ENCOUNTER — Emergency Department: Payer: Medicare Other

## 2017-07-31 DIAGNOSIS — M545 Low back pain, unspecified: Secondary | ICD-10-CM

## 2017-07-31 DIAGNOSIS — M8008XD Age-related osteoporosis with current pathological fracture, vertebra(e), subsequent encounter for fracture with routine healing: Secondary | ICD-10-CM

## 2017-07-31 DIAGNOSIS — I700 Atherosclerosis of aorta: Secondary | ICD-10-CM | POA: Diagnosis not present

## 2017-07-31 LAB — COMPREHENSIVE METABOLIC PANEL
ALT: 15 U/L (ref 14–54)
AST: 28 U/L (ref 15–41)
Albumin: 3.8 g/dL (ref 3.5–5.0)
Alkaline Phosphatase: 32 U/L — ABNORMAL LOW (ref 38–126)
Anion gap: 11 (ref 5–15)
BUN: 26 mg/dL — ABNORMAL HIGH (ref 6–20)
CO2: 23 mmol/L (ref 22–32)
Calcium: 9.8 mg/dL (ref 8.9–10.3)
Chloride: 103 mmol/L (ref 101–111)
Creatinine, Ser: 1.11 mg/dL — ABNORMAL HIGH (ref 0.44–1.00)
GFR calc Af Amer: 51 mL/min — ABNORMAL LOW (ref >60)
GFR calc non Af Amer: 44 mL/min — ABNORMAL LOW (ref >60)
Glucose, Bld: 104 mg/dL — ABNORMAL HIGH (ref 65–99)
Potassium: 3.6 mmol/L (ref 3.5–5.1)
Sodium: 137 mmol/L (ref 135–145)
Total Bilirubin: 1.7 mg/dL — ABNORMAL HIGH (ref 0.3–1.2)
Total Protein: 7.6 g/dL (ref 6.5–8.1)

## 2017-07-31 LAB — CBC
HCT: 30.8 % — ABNORMAL LOW (ref 35.0–47.0)
Hemoglobin: 9.6 g/dL — ABNORMAL LOW (ref 12.0–16.0)
MCH: 19.8 pg — ABNORMAL LOW (ref 26.0–34.0)
MCHC: 31.1 g/dL — ABNORMAL LOW (ref 32.0–36.0)
MCV: 63.5 fL — ABNORMAL LOW (ref 80.0–100.0)
Platelets: 160 10*3/uL (ref 150–440)
RBC: 4.85 MIL/uL (ref 3.80–5.20)
RDW: 17.3 % — ABNORMAL HIGH (ref 11.5–14.5)
WBC: 7.4 10*3/uL (ref 3.6–11.0)

## 2017-07-31 MED ORDER — ETODOLAC 200 MG PO CAPS: 200.0000 mg | ORAL_CAPSULE | Freq: Three times a day (TID) | ORAL | 0 refills | Status: DC

## 2017-07-31 MED ORDER — ONDANSETRON 4 MG PO TBDP
ORAL_TABLET | ORAL | Status: AC
  Filled 2017-07-31: qty 1

## 2017-07-31 MED ORDER — ONDANSETRON 4 MG PO TBDP
4.0000 mg | ORAL_TABLET | Freq: Once | ORAL | Status: AC
  Administered 2017-07-31: 4 mg via ORAL

## 2017-07-31 MED ORDER — LIDOCAINE 5 % EX PTCH: 1.0000 | MEDICATED_PATCH | Freq: Two times a day (BID) | CUTANEOUS | 0 refills | Status: DC

## 2017-07-31 MED ORDER — MORPHINE SULFATE (PF) 4 MG/ML IV SOLN
4.0000 mg | Freq: Once | INTRAVENOUS | Status: AC
  Administered 2017-07-31: 4 mg via INTRAVENOUS
  Filled 2017-07-31: qty 1

## 2017-07-31 MED ORDER — HYDROCODONE-ACETAMINOPHEN 5-325 MG PO TABS
2.0000 | ORAL_TABLET | Freq: Once | ORAL | Status: AC
  Administered 2017-07-31: 2 via ORAL
  Filled 2017-07-31: qty 2

## 2017-07-31 MED ORDER — ONDANSETRON HCL 4 MG/2ML IJ SOLN
4.0000 mg | Freq: Once | INTRAMUSCULAR | Status: AC
  Administered 2017-07-31: 4 mg via INTRAVENOUS
  Filled 2017-07-31: qty 2

## 2017-07-31 MED ORDER — LIDOCAINE 5 % EX PTCH
1.0000 | MEDICATED_PATCH | CUTANEOUS | Status: DC
  Administered 2017-07-31: 1 via TRANSDERMAL
  Filled 2017-07-31: qty 1

## 2017-07-31 MED ORDER — IOPAMIDOL (ISOVUE-370) INJECTION 76%
75.0000 mL | Freq: Once | INTRAVENOUS | Status: AC | PRN
  Administered 2017-07-31: 75 mL via INTRAVENOUS

## 2017-07-31 NOTE — ED Triage Notes (Signed)
Patient requesting water at this time. Patient encouraged to give nausea medication time to work.

## 2017-07-31 NOTE — ED Provider Notes (Signed)
[facility] Provider Note   ____________________________________________   First MD Initiated Contact with Patient 07/31/17 0300     (approximate)  I have reviewed the triage vital signs and the nursing notes.   HISTORY  Chief Complaint Back Pain    HPI [facility] is a 83 y.o. female who comes into the [facility] today with some back pain.  The patient was here on Wednesday and was told that she had a fracture in her back.  She was given Percocet but she states it is not working. She reports that her back is killing her.  She states that she is not going to make it if we do not do anything about it.  The patient denies any chest pain.  She was given morphine on Wednesday and reports that it did not work.  She also received some intranasal fentanyl here and states that it did not help at all.  The patient rates her pain a 10 out of 10 in intensity.  She has been urinating well and having bowel movements.  The patient is here today for treatment of her pain.   Past Medical History:  Diagnosis Date  . CML (chronic myelocytic leukemia) ([name])    Onc at [facility]  . Heart disease   . Hypertension   . Leukemia ([name])   . RA (rheumatoid arthritis) ([name])   . Stroke ([name])   . Thalassemia     Patient Active Problem List   Diagnosis Date Noted  . Closed fracture of proximal end of left humerus with routine healing 08/28/2016  . Acute cystitis 04/28/2016  . Chest pain 03/14/2015  . TIA (transient ischemic attack) 01/29/2015  . Allergic arthritis, hand     Past Surgical History:  Procedure Laterality Date  . ABDOMINAL HYSTERECTOMY    . CAROTID ENDARTERECTOMY Left   . CATARACT EXTRACTION    . CHOLECYSTECTOMY    . PERCUTANEOUS CORONARY STENT INTERVENTION (PCI-S)    . ROTATOR CUFF REPAIR Left   . TOTAL KNEE ARTHROPLASTY Right     Prior to Admission medications   Medication Sig Start Date End Date Taking? Authorizing Provider  ascorbic  acid (VITAMIN C) 1000 MG tablet Take 1,000 mg by mouth daily.    [provider]  aspirin 81 MG chewable tablet Chew by mouth daily.    [provider]  carvedilol (COREG) 3.125 MG tablet Take 6.25 mg by mouth 2 (two) times daily.     [provider]  cholecalciferol (VITAMIN D) 1000 UNITS tablet Take 1,000 Units by mouth daily.    [provider]  cyanocobalamin (,VITAMIN B-12,) 1000 MCG/ML injection Inject 1,000 mcg into the muscle every 30 (thirty) days.    [provider]  docusate sodium (COLACE) 100 MG capsule Take 1 capsule (100 mg total) by mouth daily as needed. 07/28/17 07/28/18  Stephanie Newport, MD  epoetin alfa (EPOGEN,PROCRIT) 83662 UNIT/ML injection Inject 10,000 Units into the skin every 14 (fourteen) days. 04/08/17   [provider]  etodolac (LODINE) 200 MG capsule Take 1 capsule (200 mg total) by mouth every 8 (eight) hours. 07/31/17   [name], MD  folic acid (FOLVITE) 1 MG tablet Take 1 mg by mouth daily.    [provider]  levothyroxine (SYNTHROID, LEVOTHROID) 50 MCG tablet Take 50 mcg by mouth daily. On the opposite days from taking 47mcg.    [provider]  lidocaine (LIDODERM) 5 % Place 1 patch onto  the skin every 12 (twelve) hours. Remove & Discard patch within 12 hours or as directed by MD 07/31/17 07/31/18  [name], MD  ondansetron (ZOFRAN ODT) 4 MG disintegrating tablet Take 1 tablet (4 mg total) by mouth every 8 (eight) hours as needed for nausea or vomiting. Patient not taking: Reported on 08/28/2016 07/08/16   Stephanie Dark, MD  ondansetron (ZOFRAN ODT) 4 MG disintegrating tablet Take 1 tablet (4 mg total) by mouth every 8 (eight) hours as needed for nausea or vomiting. 07/28/17   Stephanie Newport, MD  oxyCODONE-acetaminophen (PERCOCET) 5-325 MG tablet Take 1-2 tablets by mouth every 8 (eight) hours as needed. 07/28/17   Stephanie Newport, MD  saccharomyces boulardii  (FLORASTOR) 250 MG capsule Take 1 capsule (250 mg total) by mouth 2 (two) times daily. Patient not taking: Reported on 05/05/2017 01/30/15   Stephanie Mango, MD  vitamin E 400 UNIT capsule Take 400 Units by mouth daily.    [provider]    Allergies Penicillins and Latex  Family History  Problem Relation Age of Onset  . Acute myelogenous leukemia Brother   . Stroke Mother   . Heart attack Father     Social History Social History   Tobacco Use  . Smoking status: Never Smoker  . Smokeless tobacco: Never Used  Substance Use Topics  . Alcohol use: No  . Drug use: No    Review of Systems  Constitutional: No fever/chills Eyes: No visual changes. ENT: No sore throat. Cardiovascular: Denies chest pain. Respiratory: Denies shortness of breath. Gastrointestinal: No abdominal pain.  No nausea, no vomiting.  No diarrhea.  No constipation. Genitourinary: Negative for dysuria. Musculoskeletal:  back pain. Skin: Negative for rash. Neurological: Negative for headaches, focal weakness or numbness.   ____________________________________________   PHYSICAL EXAM:  VITAL SIGNS: ED Triage Vitals  Enc Vitals Group     BP 07/30/17 2247 (!) 199/72     Pulse Rate 07/30/17 2247 89     Resp 07/30/17 2247 (!) 21     Temp 07/30/17 2247 97.6 F (36.4 C)     Temp Source 07/30/17 2247 Oral     SpO2 07/30/17 2247 98 %     Weight 07/30/17 2246 103 lb (46.7 kg)     Height --      Head Circumference --      Peak Flow --      Pain Score 07/30/17 2246 10     Pain Loc --      Pain Edu? --      Excl. in [place]? --     Constitutional: Alert and oriented. Well appearing and in moderate to severe distress. Eyes: Conjunctivae are normal. PERRL. EOMI. Head: Atraumatic. Nose: No congestion/rhinnorhea. Mouth/Throat: Mucous membranes are moist.  Oropharynx non-erythematous. Cardiovascular: Normal rate, regular rhythm. Grossly normal heart sounds.  Good peripheral circulation. Respiratory:  Normal respiratory effort.  No retractions. Lungs CTAB. Gastrointestinal: Soft and nontender. No distention.  Positive bowel sounds  Musculoskeletal: Midline low back pain to palpation Neurologic:  Normal speech and language.  Skin:  Skin is warm, dry and intact.  Psychiatric: Mood and affect are normal.   ____________________________________________   LABS (all labs ordered are listed, but only abnormal results are displayed)  Labs Reviewed  CBC - Abnormal; Notable for the following components:      Result Value   Hemoglobin 9.6 (*)    HCT 30.8 (*)    MCV 63.5 (*)    MCH 19.8 (*)  MCHC 31.1 (*)    RDW 17.3 (*)    All other components within normal limits  COMPREHENSIVE METABOLIC PANEL - Abnormal; Notable for the following components:   Glucose, Bld 104 (*)    BUN 26 (*)    Creatinine, Ser 1.11 (*)    Alkaline Phosphatase 32 (*)    Total Bilirubin 1.7 (*)    GFR calc non Af Amer 44 (*)    GFR calc Af Amer 51 (*)    All other components within normal limits   ____________________________________________  EKG  none ____________________________________________  RADIOLOGY  Ct Angio Abd/pel W And/or Wo Contrast  Result Date: 07/31/2017 CLINICAL DATA:  83 year old female with abdominal pain and back pain. EXAM: CTA ABDOMEN AND PELVIS wITHOUT AND WITH CONTRAST TECHNIQUE: Multidetector CT imaging of the abdomen and pelvis was performed using the standard protocol during bolus administration of intravenous contrast. Multiplanar reconstructed images and MIPs were obtained and reviewed to evaluate the vascular anatomy. CONTRAST:  24mL ISOVUE-370 IOPAMIDOL (ISOVUE-370) INJECTION 76% COMPARISON:  Pelvic radiograph and lumbar spine radiograph dated 07/28/2017 and CT of the abdomen pelvis dated 12/13/2009 FINDINGS: VASCULAR Aorta: There is advanced calcified and noncalcified plaque along the aorta. There is a 2.1 cm ectasia of infrarenal abdominal aorta with thin peripheral noncalcified  plaque or mural thrombus. No aortic dissection. No periaortic inflammatory changes Celiac: Severe atherosclerotic calcification of the origin of the celiac axis the celiac artery remains patent. SMA: Atherosclerotic calcification of the origin of the SMA. The SMA remains patent. No aneurysmal dilatation or dissection. Renals: Extensive atherosclerotic calcification of the ostia of the renal arteries. The renal arteries remain patent. IMA: Patent without evidence of aneurysm, dissection, vasculitis or significant stenosis. Inflow: There is moderate atherosclerotic calcification of the iliac arteries bilaterally. These vessels remain patent. No aneurysmal dilatation or dissection. Proximal Outflow: Bilateral common femoral and visualized portions of the superficial and profunda femoral arteries are patent without evidence of aneurysm, dissection, vasculitis or significant stenosis. Veins: No obvious venous abnormality within the limitations of this arterial phase study. Review of the MIP images confirms the above findings. NON-VASCULAR Lower chest: Partially visualized small bilateral pleural effusions. Left lung base linear atelectasis/scarring. Diffuse interstitial coarsening of the lung bases. There is mild cardiomegaly and multi vessel coronary vascular calcification. No intra-abdominal free air or free fluid. Hepatobiliary: The liver is unremarkable. No intrahepatic biliary ductal dilatation. Cholecystectomy Pancreas: Unremarkable. No pancreatic ductal dilatation or surrounding inflammatory changes. Spleen: Normal in size without focal abnormality. Adrenals/Urinary Tract: The adrenal glands are unremarkable. There is a 1 cm left renal hypodense lesion which is not well characterized but likely represents a cyst. There is no hydronephrosis on either side. Mild dilatation of the right ureter. No stone. The left ureter is unremarkable. The urinary bladder is unremarkable as well. Stomach/Bowel: Small hiatal hernia.  There is sigmoid diverticulosis without active inflammatory changes. Moderate amounts of the stool noted throughout the colon. There is no bowel obstruction or active inflammation. The appendix is not visualized with certainty. No inflammatory changes identified in the right lower quadrant. Lymphatic: No adenopathy. Reproductive: Hysterectomy.  No pelvic mass. Other: None Musculoskeletal: Osteopenia with degenerative changes of the spine. T12 compression fracture as seen on the lumbar spine MRI of 04/07/2013. There is approximately 6 mm retropulsion of the superior posterior cortex of the T12 vertebra with mild associated focal narrowing of the central canal similar to the prior MRI. There is an age indeterminate mild compression fracture of the L1 vertebra new since  the MRI of 04/07/2013. Correlation with clinical exam and point tenderness recommended. There is mild no other acute fracture identified. Buckling of the posterior cortex of the L1 vertebra IMPRESSION: VASCULAR 1. Moderate Aortic Atherosclerosis (ICD10-I70.0). No aneurysm or dissection. 2. Moderate colonic stool burden. No bowel obstruction or active inflammation. 3. Scattered sigmoid diverticula without active inflammatory changes. 4. Old T12 compression fracture. Age indeterminate compression fracture of the L1 vertebra, new compared to the prior MRI of 2014. Clinical correlation is recommended. 5. Small bilateral pleural effusions. 6. Cardiomegaly with coronary vascular calcification. NON-VASCULAR Electronically Signed   By: Anner Crete M.D.   On: 07/31/2017 06:19    ____________________________________________   PROCEDURES  Procedure(s) performed: None  Procedures  Critical Care performed: No  ____________________________________________   INITIAL IMPRESSION / ASSESSMENT AND PLAN / ED COURSE  As part of my medical decision making, I reviewed the following data within the electronic MEDICAL RECORD NUMBER Notes from prior ED visits  and [place] Controlled Substance Database   This is an 83 year old female who comes into [facility] today with some back pain.  The patient was told that she had a compression fracture.  Given the patient's age my differential diagnosis also includes mesenteric ischemia versus AAA.  I did check some blood work on the patient and her hemoglobin was 9.6 with a hematocrit of 30.8.  The patient's CMP was unremarkable.  Her bili was mildly elevated at 1.7.  The patient received a CTA of her abdomen and pelvis and it showed some moderate aortic atherosclerosis with no aneurysm or dissection.  She also has a moderate colonic stool burden.  The patient has an old T12 compression fracture with an age-indeterminate compression fracture of L1 which is new compared to an MRI of 2014.  I did give the patient a dose of morphine in the emergency department.  Her pain did improve some.  I also gave her a dose of Norco and a Lidoderm patch.  The patient says that she still has Percocet at home.  Since her pain is improved she will be discharged home to follow-up with her primary care physician.      ____________________________________________   FINAL CLINICAL IMPRESSION(S) / ED DIAGNOSES  Final diagnoses:  Acute midline low back pain without sciatica  Fracture of vertebra due to osteoporosis with routine healing, subsequent encounter     ED Discharge Orders        Ordered    lidocaine (LIDODERM) 5 %  Every 12 hours     07/31/17 0658    etodolac (LODINE) 200 MG capsule  Every 8 hours     07/31/17 0037       Note:  This document was prepared using Dragon voice recognition software and may include unintentional dictation errors.    [name], MD 07/31/17 [phone]

## 2017-07-31 NOTE — ED Triage Notes (Signed)
Patient given water at this time and updated on wait time.

## 2017-07-31 NOTE — ED Triage Notes (Signed)
Spouse to stat desk stating that the patient would like something for nausea.

## 2017-07-31 NOTE — ED Notes (Signed)
Pt updated on delay. Pt is concerned regarding wait time. Pt asking for "hospital administration" to be called to notify she is in pain and does not wish to wait any longer. Explanation again provided to pt regarding wait time and triage process.

## 2017-07-31 NOTE — ED Notes (Signed)
Pt states back pain unchanged from previous ED visit despite percocet.

## 2017-07-31 NOTE — Discharge Instructions (Signed)
Please follow up with your primary care physician.

## 2017-07-31 NOTE — ED Triage Notes (Signed)
Spouse to stat desk asking about wait time. Spouse given update. Spouse verbalizes an understanding.

## 2017-07-31 NOTE — ED Notes (Signed)
Pt. Verbalizes understanding of d/c instructions, medications, and follow-up. VS stable.  Pt. In NAD at time of d/c and denies further concerns regarding this visit. Pt. Stable at the time of departure from the unit, departing unit by the safest and most appropriate manner per that pt condition and limitations with all belongings accounted for. Pt advised to return to the ED at any time for emergent concerns, or for new/worsening symptoms.   

## 2017-07-31 NOTE — ED Notes (Signed)
Patient transported to X-ray 

## 2017-07-31 NOTE — ED Notes (Signed)
Patient is resting comfortably at this time with no signs of distress present. VS stable. Will continue to monitor.   

## 2017-07-31 NOTE — ED Notes (Signed)
Pt's spouse to desk states his wife would like something for nausea.

## 2017-08-04 ENCOUNTER — Emergency Department: Payer: Medicare Other

## 2017-08-04 ENCOUNTER — Other Ambulatory Visit: Payer: Self-pay

## 2017-08-04 ENCOUNTER — Emergency Department
Admission: EM | Admit: 2017-08-04 | Discharge: 2017-08-04 | Disposition: A | Discharge status: Home / Self Care | Payer: Medicare Other | Attending: Emergency Medicine | Admitting: Emergency Medicine

## 2017-08-04 DIAGNOSIS — Z91040 Latex allergy status: Secondary | ICD-10-CM | POA: Insufficient documentation

## 2017-08-04 DIAGNOSIS — R109 Unspecified abdominal pain: Secondary | ICD-10-CM | POA: Diagnosis not present

## 2017-08-04 DIAGNOSIS — Z96651 Presence of right artificial knee joint: Secondary | ICD-10-CM | POA: Insufficient documentation

## 2017-08-04 DIAGNOSIS — Z7982 Long term (current) use of aspirin: Secondary | ICD-10-CM | POA: Insufficient documentation

## 2017-08-04 DIAGNOSIS — Z8673 Personal history of transient ischemic attack (TIA), and cerebral infarction without residual deficits: Secondary | ICD-10-CM | POA: Diagnosis not present

## 2017-08-04 DIAGNOSIS — K5900 Constipation, unspecified: Secondary | ICD-10-CM | POA: Diagnosis not present

## 2017-08-04 DIAGNOSIS — Z9049 Acquired absence of other specified parts of digestive tract: Secondary | ICD-10-CM | POA: Insufficient documentation

## 2017-08-04 DIAGNOSIS — Z79899 Other long term (current) drug therapy: Secondary | ICD-10-CM | POA: Diagnosis not present

## 2017-08-04 DIAGNOSIS — I10 Essential (primary) hypertension: Secondary | ICD-10-CM | POA: Insufficient documentation

## 2017-08-04 DIAGNOSIS — Z856 Personal history of leukemia: Secondary | ICD-10-CM | POA: Insufficient documentation

## 2017-08-04 DIAGNOSIS — R1084 Generalized abdominal pain: Secondary | ICD-10-CM | POA: Diagnosis not present

## 2017-08-04 DIAGNOSIS — M545 Low back pain: Secondary | ICD-10-CM | POA: Diagnosis not present

## 2017-08-04 MED ORDER — FLEET ENEMA 7-19 GM/118ML RE ENEM
1.0000 | ENEMA | Freq: Once | RECTAL | Status: AC
  Administered 2017-08-04: 1 via RECTAL

## 2017-08-04 MED ORDER — SODIUM CHLORIDE 0.9 % IV BOLUS (SEPSIS)
500.0000 mL | Freq: Once | INTRAVENOUS | Status: AC
  Administered 2017-08-04: 500 mL via INTRAVENOUS

## 2017-08-04 MED ORDER — ONDANSETRON HCL 4 MG/2ML IJ SOLN
4.0000 mg | Freq: Once | INTRAMUSCULAR | Status: AC
  Administered 2017-08-04: 4 mg via INTRAVENOUS
  Filled 2017-08-04: qty 2

## 2017-08-04 MED ORDER — MORPHINE SULFATE (PF) 2 MG/ML IV SOLN
2.0000 mg | Freq: Once | INTRAVENOUS | Status: AC
  Administered 2017-08-04: 2 mg via INTRAVENOUS
  Filled 2017-08-04: qty 1

## 2017-08-04 NOTE — ED Notes (Signed)
Pt expressing wishes for chaplin and social work consult due to inability to care take for family at home

## 2017-08-04 NOTE — ED Notes (Signed)
Pt upset that her constipation is not resolved. Discussed need to continue with follow up with primary care MD and to continue with care at home to alleviate and prevent constipation. Pt given Hospital pt relations number per her request. Pt states she knows this is a chronic management condition but still feels she did not get all of her concerns met at the hospital. Dr Reita Cliche in at bedside to talk to patient prior to her discharge.

## 2017-08-04 NOTE — ED Notes (Signed)
Pt noted to have small BM in bedpan at this time, pt expresses urge to still continue, bed pan changed , will monitor pt

## 2017-08-04 NOTE — ED Notes (Signed)
Pt given a phone to call family. Pt asking for remote for TV. Nurse said that she would look for one.

## 2017-08-04 NOTE — ED Triage Notes (Signed)
Pt arrived via ems from home - she has been to ED x3 in the past week - pt has fx back and states the pain is not getting any better and that she is constipated from the pain medication

## 2017-08-04 NOTE — Discharge Instructions (Signed)
You are evaluated for constipation and was given enemas here in the emergency department with bowel movements.  Return to emerge department immediately for any new or worsening or uncontrolled back or abdominal pain, fevers, black or bloody stools, vomiting, or any other symptoms concerning to you.

## 2017-08-04 NOTE — Clinical Social Work Note (Addendum)
CSW met with pt at bedside to address Social Work consult. Pt expressed her displeasure with her previous experience as a patient, at length. Pt stated she wanted to speak with Fredric Mare, which is the DSS Education officer, museum. CSW asked if there was anything this CSW could do to assist pt and what her need are at home. Pt stated she "will just speak to Ivin Booty about it." CSW updated RN and EDP and also explained that insurance would not pay for short term rehab without a 3 night qualifying stay.   CSW signing off as no further Social Work needs identified. Please reconsult if new Social Work needs arise.   Oretha Ellis, Latanya Presser, [place] Worker-Emergency Department [phone]

## 2017-08-04 NOTE — Progress Notes (Signed)
Upon arrival, Stephanie Harris was lying in bed awaiting nursing care (which was provided during [facility] visit). This is Stephanie Harris's third trip to the hospital in a week. Stephanie Harris shared of her caregiving responsibilities for her 81 yo son who has been unable to work for two years. Stephanie Harris shared of a strained marriage and her wishes for "peace and quiet." Husband Collins Scotland) arrived during visit and Stephanie Harris asked for more 1on1 time with chaplain which Collins Scotland allowed. Chaplain offered listening presence, exploration of concerns, education related to Advanced Directive and prayer. Chaplain aided Collins Scotland to and from the waiting area.      08/04/17 1800  Clinical Encounter Type  Visit Type Initial;ED;Spiritual support;Psychological support  Referral From Nurse  Spiritual Encounters  Spiritual Needs Prayer;Emotional  Stress Factors  Patient Stress Factors Family relationships;Major life changes;Exhausted  Advance Directives (For Healthcare)  Does Patient Have a Medical Advance Directive? No

## 2017-08-04 NOTE — ED Notes (Signed)
Pt sleeping on nurse's arrival to room. NAD.

## 2017-08-04 NOTE — ED Notes (Signed)
Pt stating that she is having abdominal pain because she has not had a BM for a week. Pt stating that she has been take Linzess, Colace, and Docusate Sodium but has not had a BM. Pt stating that she has a back fx and has been taking Percocet for her back pain.

## 2017-08-04 NOTE — ED Notes (Signed)
[place], RN given report.
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
## 2017-08-04 NOTE — ED Notes (Signed)
Pt has been placed on pads and bedpan at this time. Pt given soap suds enema. Pt was not retaining enema well, but stating that she is holding some. Pt in NAD, call light within reach. Nurse instructed pt to hit call light for assistance.

## 2017-08-04 NOTE — ED Notes (Signed)
Patient transported to X-ray 

## 2017-08-11 ENCOUNTER — Encounter: Payer: Self-pay | Admitting: Emergency Medicine

## 2017-08-11 ENCOUNTER — Emergency Department: Payer: Medicare Other

## 2017-08-11 ENCOUNTER — Other Ambulatory Visit: Payer: Self-pay

## 2017-08-11 ENCOUNTER — Observation Stay
Admission: EM | Admit: 2017-08-11 | Discharge: 2017-08-13 | Disposition: A | Discharge status: Home Health | Payer: Medicare Other | Attending: Internal Medicine | Admitting: Internal Medicine

## 2017-08-11 ENCOUNTER — Observation Stay: Payer: Medicare Other

## 2017-08-11 DIAGNOSIS — Z91040 Latex allergy status: Secondary | ICD-10-CM | POA: Diagnosis not present

## 2017-08-11 DIAGNOSIS — S32018A Other fracture of first lumbar vertebra, initial encounter for closed fracture: Secondary | ICD-10-CM | POA: Diagnosis not present

## 2017-08-11 DIAGNOSIS — K5900 Constipation, unspecified: Secondary | ICD-10-CM | POA: Diagnosis not present

## 2017-08-11 DIAGNOSIS — Z7982 Long term (current) use of aspirin: Secondary | ICD-10-CM | POA: Insufficient documentation

## 2017-08-11 DIAGNOSIS — Z880 Allergy status to penicillin: Secondary | ICD-10-CM | POA: Diagnosis not present

## 2017-08-11 DIAGNOSIS — M069 Rheumatoid arthritis, unspecified: Secondary | ICD-10-CM | POA: Insufficient documentation

## 2017-08-11 DIAGNOSIS — R262 Difficulty in walking, not elsewhere classified: Secondary | ICD-10-CM | POA: Diagnosis not present

## 2017-08-11 DIAGNOSIS — M549 Dorsalgia, unspecified: Secondary | ICD-10-CM | POA: Insufficient documentation

## 2017-08-11 DIAGNOSIS — M5124 Other intervertebral disc displacement, thoracic region: Secondary | ICD-10-CM | POA: Diagnosis not present

## 2017-08-11 DIAGNOSIS — X58XXXA Exposure to other specified factors, initial encounter: Secondary | ICD-10-CM | POA: Insufficient documentation

## 2017-08-11 DIAGNOSIS — C9210 Chronic myeloid leukemia, BCR/ABL-positive, not having achieved remission: Secondary | ICD-10-CM | POA: Diagnosis not present

## 2017-08-11 DIAGNOSIS — I200 Unstable angina: Secondary | ICD-10-CM | POA: Diagnosis not present

## 2017-08-11 DIAGNOSIS — R079 Chest pain, unspecified: Secondary | ICD-10-CM | POA: Diagnosis not present

## 2017-08-11 DIAGNOSIS — I10 Essential (primary) hypertension: Secondary | ICD-10-CM | POA: Insufficient documentation

## 2017-08-11 DIAGNOSIS — Z8673 Personal history of transient ischemic attack (TIA), and cerebral infarction without residual deficits: Secondary | ICD-10-CM | POA: Diagnosis not present

## 2017-08-11 DIAGNOSIS — R0789 Other chest pain: Secondary | ICD-10-CM | POA: Diagnosis not present

## 2017-08-11 DIAGNOSIS — M5126 Other intervertebral disc displacement, lumbar region: Secondary | ICD-10-CM | POA: Diagnosis not present

## 2017-08-11 DIAGNOSIS — Z419 Encounter for procedure for purposes other than remedying health state, unspecified: Secondary | ICD-10-CM

## 2017-08-11 DIAGNOSIS — Z79899 Other long term (current) drug therapy: Secondary | ICD-10-CM | POA: Diagnosis not present

## 2017-08-11 DIAGNOSIS — D569 Thalassemia, unspecified: Secondary | ICD-10-CM | POA: Diagnosis not present

## 2017-08-11 LAB — BASIC METABOLIC PANEL
Anion gap: 10 (ref 5–15)
BUN: 17 mg/dL (ref 6–20)
CO2: 25 mmol/L (ref 22–32)
Calcium: 10 mg/dL (ref 8.9–10.3)
Chloride: 103 mmol/L (ref 101–111)
Creatinine, Ser: 1.14 mg/dL — ABNORMAL HIGH (ref 0.44–1.00)
GFR calc Af Amer: 50 mL/min — ABNORMAL LOW (ref >60)
GFR calc non Af Amer: 43 mL/min — ABNORMAL LOW (ref >60)
Glucose, Bld: 102 mg/dL — ABNORMAL HIGH (ref 65–99)
Potassium: 4.7 mmol/L (ref 3.5–5.1)
Sodium: 138 mmol/L (ref 135–145)

## 2017-08-11 LAB — CBC
HCT: 29.8 % — ABNORMAL LOW (ref 35.0–47.0)
Hemoglobin: 9.2 g/dL — ABNORMAL LOW (ref 12.0–16.0)
MCH: 19.3 pg — ABNORMAL LOW (ref 26.0–34.0)
MCHC: 31 g/dL — ABNORMAL LOW (ref 32.0–36.0)
MCV: 62.5 fL — ABNORMAL LOW (ref 80.0–100.0)
Platelets: 168 10*3/uL (ref 150–440)
RBC: 4.77 MIL/uL (ref 3.80–5.20)
RDW: 16.3 % — ABNORMAL HIGH (ref 11.5–14.5)
WBC: 7.3 10*3/uL (ref 3.6–11.0)

## 2017-08-11 LAB — TROPONIN I
Troponin I: <0.03 ng/mL (ref <0.03)
Troponin I: <0.03 ng/mL (ref <0.03)
Troponin I: <0.03 ng/mL (ref <0.03)

## 2017-08-11 MED ORDER — ENOXAPARIN SODIUM 40 MG/0.4ML ~~LOC~~ SOLN: 30.0000 mg | SUBCUTANEOUS | Status: DC

## 2017-08-11 MED ORDER — ONDANSETRON HCL 4 MG/2ML IJ SOLN
4.0000 mg | Freq: Four times a day (QID) | INTRAMUSCULAR | Status: DC | PRN
  Administered 2017-08-11 – 2017-08-13 (×2): 4 mg via INTRAVENOUS
  Filled 2017-08-11 (×2): qty 2

## 2017-08-11 MED ORDER — CARVEDILOL 6.25 MG PO TABS
6.2500 mg | ORAL_TABLET | Freq: Two times a day (BID) | ORAL | Status: DC
  Administered 2017-08-11 – 2017-08-13 (×5): 6.25 mg via ORAL
  Filled 2017-08-11 (×5): qty 1

## 2017-08-11 MED ORDER — ALBUTEROL SULFATE (2.5 MG/3ML) 0.083% IN NEBU: 2.5000 mg | INHALATION_SOLUTION | RESPIRATORY_TRACT | Status: DC | PRN

## 2017-08-11 MED ORDER — ONDANSETRON HCL 4 MG PO TABS: 4.0000 mg | ORAL_TABLET | Freq: Four times a day (QID) | ORAL | Status: DC | PRN

## 2017-08-11 MED ORDER — ACETAMINOPHEN 325 MG PO TABS: 650.0000 mg | ORAL_TABLET | Freq: Four times a day (QID) | ORAL | Status: DC | PRN

## 2017-08-11 MED ORDER — OXYCODONE-ACETAMINOPHEN 5-325 MG PO TABS
1.0000 | ORAL_TABLET | ORAL | Status: DC | PRN
  Administered 2017-08-11 (×2): 2 via ORAL
  Administered 2017-08-12: 1 via ORAL
  Administered 2017-08-12 – 2017-08-13 (×2): 2 via ORAL
  Filled 2017-08-11: qty 2
  Filled 2017-08-11: qty 1
  Filled 2017-08-11: qty 2
  Filled 2017-08-11 (×3): qty 1
  Filled 2017-08-11 (×2): qty 2

## 2017-08-11 MED ORDER — DOCUSATE SODIUM 100 MG PO CAPS
100.0000 mg | ORAL_CAPSULE | Freq: Two times a day (BID) | ORAL | Status: DC
  Administered 2017-08-11 – 2017-08-13 (×4): 100 mg via ORAL
  Filled 2017-08-11 (×4): qty 1

## 2017-08-11 MED ORDER — NITROGLYCERIN 2 % TD OINT
0.5000 [in_us] | TOPICAL_OINTMENT | Freq: Once | TRANSDERMAL | Status: AC
  Administered 2017-08-11: 0.5 [in_us] via TOPICAL
  Filled 2017-08-11: qty 1

## 2017-08-11 MED ORDER — FOLIC ACID 1 MG PO TABS
1.0000 mg | ORAL_TABLET | Freq: Every day | ORAL | Status: DC
  Administered 2017-08-11 – 2017-08-13 (×3): 1 mg via ORAL
  Filled 2017-08-11 (×3): qty 1

## 2017-08-11 MED ORDER — ACETAMINOPHEN 650 MG RE SUPP: 650.0000 mg | Freq: Four times a day (QID) | RECTAL | Status: DC | PRN

## 2017-08-11 MED ORDER — BISACODYL 10 MG RE SUPP
10.0000 mg | Freq: Every day | RECTAL | Status: DC | PRN
  Filled 2017-08-11: qty 1

## 2017-08-11 MED ORDER — FLEET ENEMA 7-19 GM/118ML RE ENEM
1.0000 | ENEMA | Freq: Once | RECTAL | Status: AC
  Administered 2017-08-11: 1 via RECTAL

## 2017-08-11 MED ORDER — CLINDAMYCIN PHOSPHATE 600 MG/50ML IV SOLN
600.0000 mg | INTRAVENOUS | Status: AC
  Administered 2017-08-12: 600 mg via INTRAVENOUS
  Filled 2017-08-11: qty 50

## 2017-08-11 MED ORDER — POLYETHYLENE GLYCOL 3350 17 G PO PACK
17.0000 g | PACK | Freq: Two times a day (BID) | ORAL | Status: DC
  Administered 2017-08-11 – 2017-08-13 (×4): 17 g via ORAL
  Filled 2017-08-11 (×3): qty 1

## 2017-08-11 MED ORDER — ASPIRIN EC 81 MG PO TBEC
81.0000 mg | DELAYED_RELEASE_TABLET | Freq: Every day | ORAL | Status: DC
  Administered 2017-08-12 – 2017-08-13 (×2): 81 mg via ORAL
  Filled 2017-08-11 (×2): qty 1

## 2017-08-11 MED ORDER — ONDANSETRON HCL 4 MG/2ML IJ SOLN
INTRAMUSCULAR | Status: AC
  Filled 2017-08-11: qty 2

## 2017-08-11 MED ORDER — LEVOTHYROXINE SODIUM 50 MCG PO TABS
50.0000 ug | ORAL_TABLET | Freq: Every day | ORAL | Status: DC
  Administered 2017-08-11 – 2017-08-13 (×3): 50 ug via ORAL
  Filled 2017-08-11 (×3): qty 1

## 2017-08-11 MED ORDER — POLYETHYLENE GLYCOL 3350 17 G PO PACK: 17.0000 g | PACK | Freq: Two times a day (BID) | ORAL | Status: DC

## 2017-08-11 MED ORDER — SODIUM CHLORIDE 0.9% FLUSH
3.0000 mL | Freq: Two times a day (BID) | INTRAVENOUS | Status: DC
  Administered 2017-08-11 – 2017-08-13 (×4): 3 mL via INTRAVENOUS

## 2017-08-11 MED ORDER — POLYETHYLENE GLYCOL 3350 17 G PO PACK
17.0000 g | PACK | Freq: Every day | ORAL | Status: DC
  Administered 2017-08-11: 17 g via ORAL
  Filled 2017-08-11: qty 1

## 2017-08-11 MED ORDER — ONDANSETRON HCL 4 MG/2ML IJ SOLN
4.0000 mg | Freq: Once | INTRAMUSCULAR | Status: AC | PRN
  Administered 2017-08-11: 4 mg via INTRAVENOUS

## 2017-08-11 NOTE — Progress Notes (Signed)
Anticoagulation monitoring(Lovenox):  83yo  female ordered Lovenox 40 mg Q24h  Filed Weights   08/11/17 0518  Weight: 103 lb (46.7 kg)   BMI 20.2   Lab Results  Component Value Date   CREATININE 1.14 (H) 08/11/2017   CREATININE 1.11 (H) 07/31/2017   CREATININE 1.17 (H) 07/28/2017   Estimated Creatinine Clearance: 26.4 mL/min (A) (by C-G formula based on SCr of 1.14 mg/dL (H)). Hemoglobin & Hematocrit     Component Value Date/Time   HGB 9.2 (L) 08/11/2017 0516   HGB 9.6 (L) 11/24/2013 0539   HCT 29.8 (L) 08/11/2017 0516   HCT 30.8 (L) 11/24/2013 0539     Per Protocol for Patient with estCrcl < 30 ml/min and BMI < 40, will transition to Lovenox 30 mg Q24h

## 2017-08-11 NOTE — Consult Note (Signed)
Patient evaluated and MRI reviewed.  Old T 11(12) fracture, new L1 fracture consistent with pain. Recommend kyphoplasty.

## 2017-08-11 NOTE — Progress Notes (Signed)
   08/11/17 0900  Clinical Encounter Type  Visited With Patient  Visit Type Initial;Other (Comment) (HCPOA)  Referral From Nurse  Spiritual Encounters  Spiritual Needs Literature;Other (Comment) (HCPOA)  HCPOA/AD materials dropped off with patient. Patient has a completed HCPOA with [facility] and requires Notary.  [place] will coordinate with notary accordingly.

## 2017-08-11 NOTE — Progress Notes (Signed)
Discussed with Dr. Rudene Christians.  Ordered MRI thoracic and lumbar spine.  Depending on MRI results likely kyphoplasty tomorrow.

## 2017-08-11 NOTE — ED Triage Notes (Signed)
Patient to ER via [facility] from home for c/o chest pain and back pain. Patient states she has chronic back pain with history of compound fracture to lower back (07/27/17). Patient states she has been seen here 3 times for back pain.  Patient states she went to bed at approx 2330 and was having back pain at that time, took 324 ASA at that time. Then developed chest tightness and pain across shoulders, +nausea then as well. Patient has been unable to follow up with MD's at [facility] (ortho and leukemia).

## 2017-08-11 NOTE — H&P (Signed)
[facility] [place] NAME: Stephanie Harris    MR#:  [id number]  DATE OF BIRTH:  05-09-1931  DATE OF ADMISSION:  08/11/2017  PRIMARY CARE PHYSICIAN: [name], MD   REQUESTING/REFERRING PHYSICIAN: Dr. Dahlia Client  CHIEF COMPLAINT:   Chief Complaint  Patient presents with  . Chest Pain    HISTORY OF PRESENT ILLNESS:  Stephanie Harris  is a 83 y.o. female with a known history of CML, HTN, gait abnormality here with chest pain and back pain. She has had 4 recent ED visits for back pain.  Imaging showed T12 and L1 chronic compression fractures. Pain was being controlled by Percocet but this was stopped due to constipation.  Today her chest pain was pressure-like and tenderness on the chest.  No shortness of breath.  Had some sweating.  Had normal stress test 2 years back.  No exertional shortness of breath.   PAST MEDICAL HISTORY:   Past Medical History:  Diagnosis Date  . CML (chronic myelocytic leukemia) ([place])    Onc at [facility]  . Heart disease   . Hypertension   . Leukemia ([place])   . RA (rheumatoid arthritis) ([place])   . Stroke ([place])   . Thalassemia     PAST SURGICAL HISTORY:   Past Surgical History:  Procedure Laterality Date  . ABDOMINAL HYSTERECTOMY    . CAROTID ENDARTERECTOMY Left   . CATARACT EXTRACTION    . CHOLECYSTECTOMY    . PERCUTANEOUS CORONARY STENT INTERVENTION (PCI-S)    . ROTATOR CUFF REPAIR Left   . TOTAL KNEE ARTHROPLASTY Right     SOCIAL HISTORY:   Social History   Tobacco Use  . Smoking status: Never Smoker  . Smokeless tobacco: Never Used  Substance Use Topics  . Alcohol use: No    FAMILY HISTORY:   Family History  Problem Relation Age of Onset  . Acute myelogenous leukemia Brother   . Stroke Mother   . Heart attack Father     DRUG ALLERGIES:   Allergies  Allergen Reactions  . Penicillins Other (See Comments)    Has patient had a PCN reaction causing immediate rash, facial/tongue/throat  swelling, SOB or lightheadedness with hypotension: Unknown Has patient had a PCN reaction causing severe rash involving mucus membranes or skin necrosis: Unknown Has patient had a PCN reaction that required hospitalization: Unknown Has patient had a PCN reaction occurring within the last 10 years: Unknown If all of the above answers are "NO", then may proceed with Cephalosporin use.   . Latex Rash    REVIEW OF SYSTEMS:   Review of Systems  Constitutional: Positive for malaise/fatigue. Negative for chills and fever.  HENT: Negative for sore throat.   Eyes: Negative for blurred vision, double vision and pain.  Respiratory: Negative for cough, hemoptysis, shortness of breath and wheezing.   Cardiovascular: Positive for chest pain. Negative for palpitations, orthopnea and leg swelling.  Gastrointestinal: Negative for abdominal pain, constipation, diarrhea, heartburn, nausea and vomiting.  Genitourinary: Negative for dysuria and hematuria.  Musculoskeletal: Positive for back pain. Negative for joint pain.  Skin: Negative for rash.  Neurological: Positive for weakness. Negative for sensory change, speech change, focal weakness and headaches.  Endo/Heme/Allergies: Does not bruise/bleed easily.  Psychiatric/Behavioral: Negative for depression. The patient is not nervous/anxious.     MEDICATIONS AT HOME:   Prior to Admission medications   Medication Sig Start Date End Date Taking? Authorizing Provider  ascorbic acid (VITAMIN C) 1000 MG tablet  Take 1,000 mg by mouth daily.   Yes [provider]  aspirin 81 MG EC tablet Take 81 mg by mouth daily.    Yes [provider]  carvedilol (COREG) 3.125 MG tablet Take 6.25 mg by mouth 2 (two) times daily.    Yes [provider]  cholecalciferol (VITAMIN D) 1000 UNITS tablet Take 1,000 Units by mouth daily.   Yes [provider]  cyanocobalamin (,VITAMIN B-12,) 1000 MCG/ML injection Inject 1,000 mcg into the muscle  every 30 (thirty) days.   Yes [provider]  docusate sodium (COLACE) 100 MG capsule Take 1 capsule (100 mg total) by mouth daily as needed. Patient taking differently: Take 100 mg by mouth daily as needed for mild constipation.  07/28/17 07/28/18 Yes [name], MD  epoetin alfa (EPOGEN,PROCRIT) 79892 UNIT/ML injection Inject 10,000 Units into the skin every 14 (fourteen) days. 04/08/17  Yes [provider]  folic acid (FOLVITE) 1 MG tablet Take 1 mg by mouth daily.   Yes [provider]  levothyroxine (SYNTHROID, LEVOTHROID) 50 MCG tablet Take 50 mcg by mouth daily. On the opposite days from taking 42mcg.   Yes [provider]  ondansetron (ZOFRAN ODT) 4 MG disintegrating tablet Take 1 tablet (4 mg total) by mouth every 8 (eight) hours as needed for nausea or vomiting. 07/28/17  Yes [name], MD  vitamin E 400 UNIT capsule Take 400 Units by mouth daily.   Yes [provider]     VITAL SIGNS:  Blood pressure (!) 173/57, pulse 73, temperature 97.7 F (36.5 C), temperature source Oral, resp. rate 18, height 5' (1.524 m), weight 45.7 kg (100 lb 12.8 oz), SpO2 100 %.  PHYSICAL EXAMINATION:  Physical Exam  GENERAL:  83 y.o.-year-old patient lying in the bed with no acute distress.  EYES: Pupils equal, round, reactive to light and accommodation. No scleral icterus. Extraocular muscles intact.  HEENT: Head atraumatic, normocephalic. Oropharynx and nasopharynx clear. No oropharyngeal erythema, moist oral mucosa  NECK:  Supple, no jugular venous distention. No thyroid enlargement, no tenderness.  LUNGS: Normal breath sounds bilaterally, no wheezing, rales, rhonchi. No use of accessory muscles of respiration.  Tender chest wall CARDIOVASCULAR: S1, S2 normal. No murmurs, rubs, or gallops.  ABDOMEN: Soft, nontender, nondistended. Bowel sounds present. No organomegaly or mass.  EXTREMITIES: No pedal edema, cyanosis, or clubbing. + 2 pedal  & radial pulses b/l.   NEUROLOGIC: Cranial nerves II through XII are intact. No focal Motor or sensory deficits appreciated b/l PSYCHIATRIC: The patient is alert and oriented x 3. SKIN: No obvious rash, lesion, or ulcer.   LABORATORY PANEL:   CBC Recent Labs  Lab 08/11/17 0516  WBC 7.3  HGB 9.2*  HCT 29.8*  PLT 168   ------------------------------------------------------------------------------------------------------------------  Chemistries  Recent Labs  Lab 08/11/17 0516  NA 138  K 4.7  CL 103  CO2 25  GLUCOSE 102*  BUN 17  CREATININE 1.14*  CALCIUM 10.0   ------------------------------------------------------------------------------------------------------------------  Cardiac Enzymes Recent Labs  Lab 08/11/17 0845  TROPONINI <0.03   ------------------------------------------------------------------------------------------------------------------  RADIOLOGY:  Dg Chest Port 1 View  Result Date: 08/11/2017 CLINICAL DATA:  Chest pain.  Chronic back pain. EXAM: PORTABLE CHEST 1 VIEW COMPARISON:  Chest radiograph 07/04/2016. FINDINGS: Low lung volumes. Normal heart size. Unchanged aortic tortuosity and atherosclerosis. Pulmonary vasculature is normal. No consolidation, pleural effusion, or pneumothorax. No acute osseous abnormalities are seen. Remote left proximal humerus fracture. T12 and L1 fractures on recent abdominal CT  are not visualized on this AP chest exam. IMPRESSION: Low lung volumes without acute abnormality. Electronically Signed   By: Jeb Levering M.D.   On: 08/11/2017 06:38     IMPRESSION AND PLAN:   * Chest pain. Non cardiac. Pleuritic likely musculoskeletal. Troponin/EHG normal  * Back pain with T12-L1 compression fractures Pain control with percocet PT  * CML Follows at cancer center  * Constipation Miralax and enema  D/C when ACS ruled out  All the records are reviewed and case discussed with ED provider. Management plans  discussed with the patient, family and they are in agreement.  CODE STATUS: FULL CODE  TOTAL TIME TAKING CARE OF THIS PATIENT: 35 minutes.   Leia Alf Dee Maday M.D on 08/11/2017 at 10:43 AM  Between 7am to 6pm - Pager - [phone]  After 6pm go to [email or web address] - password [email or web address] [facility]  Office  [phone]  CC: Primary care physician; [name], MD  Note: This dictation was prepared with Dragon dictation along with smaller phrase technology. Any transcriptional errors that result from this process are unintentional.

## 2017-08-11 NOTE — Progress Notes (Signed)
PT Cancellation Note  Patient Details Name: CLEMIE GENERAL MRN: [id number] DOB: 1931-04-08   Cancelled Treatment:    Reason Eval/Treat Not Completed: Medical issues which prohibited therapy(Consult received and chart reviewed. Patient noted with pending thoracic and lumbar spine MRI to further evaluate compression fractures, likely undergoing kyphoplasty to correct next date.  Will hold PT evaluation until results received, plan established and patient cleared for exertional activity.)   Jalaysha Skilton H. Owens Shark, PT, DPT, NCS 08/11/17, 2:23 PM [phone]
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
## 2017-08-11 NOTE — ED Notes (Signed)
This RN to bedside, pt now c/o nausea, pt given an emesis bag.

## 2017-08-11 NOTE — ED Notes (Signed)
Per Dr. Jimmye Norman, okay to give PRN dose of Zofran to patient now due to c/o nausea.

## 2017-08-11 NOTE — ED Notes (Signed)
This RN to bedside at this time. Pt states she is having back pain at this time. Pt states she has not been taking pain medications due to severe constipation, requesting something for pain at this time. Pt is alert and oriented.
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
## 2017-08-11 NOTE — Care Management Obs Status (Signed)
[facility] NOTIFICATION   Patient Details  Name: Stephanie Harris MRN: [id number] Date of Birth: [birth year]   Medicare Observation Status Notification Given:       Beverly Sessions, RN 08/11/2017, 4:11 PM

## 2017-08-12 ENCOUNTER — Encounter
Admission: EM | Disposition: A | Discharge status: Home Health | Payer: Self-pay | Source: Home / Self Care | Attending: Emergency Medicine

## 2017-08-12 ENCOUNTER — Observation Stay: Payer: Medicare Other

## 2017-08-12 ENCOUNTER — Observation Stay: Payer: Medicare Other | Admitting: Anesthesiology

## 2017-08-12 ENCOUNTER — Encounter: Payer: Self-pay | Admitting: *Deleted

## 2017-08-12 DIAGNOSIS — M549 Dorsalgia, unspecified: Secondary | ICD-10-CM | POA: Diagnosis not present

## 2017-08-12 DIAGNOSIS — S32010A Wedge compression fracture of first lumbar vertebra, initial encounter for closed fracture: Secondary | ICD-10-CM | POA: Diagnosis not present

## 2017-08-12 DIAGNOSIS — M1990 Unspecified osteoarthritis, unspecified site: Secondary | ICD-10-CM | POA: Diagnosis not present

## 2017-08-12 DIAGNOSIS — Z981 Arthrodesis status: Secondary | ICD-10-CM | POA: Diagnosis not present

## 2017-08-12 DIAGNOSIS — I10 Essential (primary) hypertension: Secondary | ICD-10-CM | POA: Diagnosis not present

## 2017-08-12 DIAGNOSIS — S32019A Unspecified fracture of first lumbar vertebra, initial encounter for closed fracture: Secondary | ICD-10-CM | POA: Diagnosis not present

## 2017-08-12 DIAGNOSIS — R079 Chest pain, unspecified: Secondary | ICD-10-CM | POA: Diagnosis not present

## 2017-08-12 HISTORY — PX: KYPHOPLASTY: SHX5884

## 2017-08-12 LAB — CREATININE, SERUM
Creatinine, Ser: 1.37 mg/dL — ABNORMAL HIGH (ref 0.44–1.00)
GFR calc Af Amer: 40 mL/min — ABNORMAL LOW (ref >60)
GFR calc non Af Amer: 34 mL/min — ABNORMAL LOW (ref >60)

## 2017-08-12 LAB — CBC
HCT: 27.7 % — ABNORMAL LOW (ref 35.0–47.0)
Hemoglobin: 8.5 g/dL — ABNORMAL LOW (ref 12.0–16.0)
MCH: 19.1 pg — ABNORMAL LOW (ref 26.0–34.0)
MCHC: 30.7 g/dL — ABNORMAL LOW (ref 32.0–36.0)
MCV: 62.3 fL — ABNORMAL LOW (ref 80.0–100.0)
Platelets: 131 10*3/uL — ABNORMAL LOW (ref 150–440)
RBC: 4.45 MIL/uL (ref 3.80–5.20)
RDW: 15.9 % — ABNORMAL HIGH (ref 11.5–14.5)
WBC: 5.8 10*3/uL (ref 3.6–11.0)

## 2017-08-12 SURGERY — KYPHOPLASTY
Anesthesia: Monitor Anesthesia Care | Wound class: Clean

## 2017-08-12 MED ORDER — PROPOFOL 500 MG/50ML IV EMUL
INTRAVENOUS | Status: DC | PRN
  Administered 2017-08-12: 35 ug/kg/min via INTRAVENOUS

## 2017-08-12 MED ORDER — ONDANSETRON HCL 4 MG/2ML IJ SOLN: 4.0000 mg | Freq: Once | INTRAMUSCULAR | Status: DC | PRN

## 2017-08-12 MED ORDER — DARBEPOETIN ALFA 25 MCG/0.42ML IJ SOSY
25.0000 ug | PREFILLED_SYRINGE | Freq: Once | INTRAMUSCULAR | Status: DC
  Filled 2017-08-12: qty 0.42

## 2017-08-12 MED ORDER — OXYCODONE-ACETAMINOPHEN 5-325 MG PO TABS: 1.0000 | ORAL_TABLET | Freq: Four times a day (QID) | ORAL | 0 refills | Status: DC | PRN

## 2017-08-12 MED ORDER — POLYETHYLENE GLYCOL 3350 17 G PO PACK: 17.0000 g | PACK | Freq: Every day | ORAL | 0 refills | Status: DC | PRN

## 2017-08-12 MED ORDER — LACTATED RINGERS IV SOLN
INTRAVENOUS | Status: DC
  Administered 2017-08-12 – 2017-08-13 (×4): via INTRAVENOUS

## 2017-08-12 MED ORDER — ENOXAPARIN SODIUM 30 MG/0.3ML ~~LOC~~ SOLN
30.0000 mg | SUBCUTANEOUS | Status: DC
  Administered 2017-08-13: 30 mg via SUBCUTANEOUS
  Filled 2017-08-12: qty 0.3

## 2017-08-12 MED ORDER — IOPAMIDOL (ISOVUE-M 200) INJECTION 41%
INTRAMUSCULAR | Status: AC
Start: 2017-08-12 — End: 2017-08-12
  Filled 2017-08-12: qty 40

## 2017-08-12 MED ORDER — KETAMINE HCL 50 MG/ML IJ SOLN
INTRAMUSCULAR | Status: AC
  Filled 2017-08-12: qty 10

## 2017-08-12 MED ORDER — LIDOCAINE HCL (PF) 1 % IJ SOLN
INTRAMUSCULAR | Status: AC
Start: 2017-08-12 — End: 2017-08-12
  Filled 2017-08-12: qty 60

## 2017-08-12 MED ORDER — MIDAZOLAM HCL 2 MG/2ML IJ SOLN
INTRAMUSCULAR | Status: DC | PRN
  Administered 2017-08-12 (×2): 1 mg via INTRAVENOUS

## 2017-08-12 MED ORDER — FENTANYL CITRATE (PF) 100 MCG/2ML IJ SOLN: 25.0000 ug | INTRAMUSCULAR | Status: DC | PRN

## 2017-08-12 MED ORDER — MIDAZOLAM HCL 2 MG/2ML IJ SOLN
INTRAMUSCULAR | Status: AC
  Filled 2017-08-12: qty 2

## 2017-08-12 MED ORDER — PROPOFOL 10 MG/ML IV BOLUS
INTRAVENOUS | Status: AC
  Filled 2017-08-12: qty 20

## 2017-08-12 MED ORDER — DARBEPOETIN ALFA 40 MCG/0.4ML IJ SOSY
40.0000 ug | PREFILLED_SYRINGE | Freq: Once | INTRAMUSCULAR | Status: AC
  Administered 2017-08-12: 40 ug via SUBCUTANEOUS
  Filled 2017-08-12: qty 0.4

## 2017-08-12 MED ORDER — KETAMINE HCL 50 MG/ML IJ SOLN
INTRAMUSCULAR | Status: DC | PRN
  Administered 2017-08-12: 25 mg via INTRAMUSCULAR

## 2017-08-12 MED ORDER — BUPIVACAINE-EPINEPHRINE (PF) 0.5% -1:200000 IJ SOLN
INTRAMUSCULAR | Status: AC
Start: 2017-08-12 — End: 2017-08-12
  Filled 2017-08-12: qty 30

## 2017-08-12 MED ORDER — METOCLOPRAMIDE HCL 5 MG PO TABS: 5.0000 mg | ORAL_TABLET | Freq: Three times a day (TID) | ORAL | Status: DC | PRN

## 2017-08-12 MED ORDER — METOCLOPRAMIDE HCL 5 MG/ML IJ SOLN
5.0000 mg | Freq: Three times a day (TID) | INTRAMUSCULAR | Status: DC | PRN
Start: 2017-08-12 — End: 2017-08-13

## 2017-08-12 SURGICAL SUPPLY — 19 items
ADH SKN CLS APL DERMABOND .7 (GAUZE/BANDAGES/DRESSINGS) ×1
CEMENT KYPHON CX01A KIT/MIXER (Cement) ×2 IMPLANT
DERMABOND ADVANCED (GAUZE/BANDAGES/DRESSINGS) ×1
DERMABOND ADVANCED .7 DNX12 (GAUZE/BANDAGES/DRESSINGS) ×1 IMPLANT
DEVICE BIOPSY BONE KYPHX (INSTRUMENTS) ×2 IMPLANT
DRAPE C-ARM XRAY 36X54 (DRAPES) ×2 IMPLANT
DRAPE LAPAROTOMY 100X77 ABD (DRAPES) IMPLANT
DRAPE UTILITY 15X26 TOWEL STRL (DRAPES) ×1 IMPLANT
DURAPREP 26ML APPLICATOR (WOUND CARE) ×2 IMPLANT
GLOVE SURG SYN 9.0  PF PI (GLOVE) ×1
GLOVE SURG SYN 9.0 PF PI (GLOVE) ×1 IMPLANT
GOWN SRG 2XL LVL 4 RGLN SLV (GOWNS) ×1 IMPLANT
GOWN STRL NON-REIN 2XL LVL4 (GOWNS) ×2
GOWN STRL REUS W/ TWL LRG LVL3 (GOWN DISPOSABLE) ×1 IMPLANT
GOWN STRL REUS W/TWL LRG LVL3 (GOWN DISPOSABLE) ×2
PACK KYPHOPLASTY (MISCELLANEOUS) ×2 IMPLANT
STRAP SAFETY BODY (MISCELLANEOUS) ×2 IMPLANT
TRAY KYPHOPAK 15/3 EXPRESS 1ST (MISCELLANEOUS) ×2 IMPLANT
TRAY KYPHOPAK 20/3 EXPRESS 1ST (MISCELLANEOUS) ×2 IMPLANT

## 2017-08-12 NOTE — Plan of Care (Signed)
  Health Behavior/Discharge Planning: Ability to manage health-related needs will improve 08/12/2017 1420 - Progressing by Daron Offer, RN Note Patient agreeable to kyphoplasty procedure earlier today. Remains in surgery now. Will assess the patient upon return to unit. Wenda Low [facility]

## 2017-08-12 NOTE — Anesthesia Post-op Follow-up Note (Signed)
Anesthesia QCDR form completed.        

## 2017-08-12 NOTE — Plan of Care (Signed)
Complaints of back pain continue.  Relief with prn pain meds.

## 2017-08-12 NOTE — Progress Notes (Signed)
Procedure discussed with patient along with MRI findings of new L1 fracture.  Site marked

## 2017-08-12 NOTE — Anesthesia Preprocedure Evaluation (Addendum)
Anesthesia Evaluation  Patient identified by MRN, date of birth, ID band Patient awake    Reviewed: Allergy & Precautions, NPO status , Patient's Chart, lab work & pertinent test results, reviewed documented beta blocker date and time   Airway Mallampati: III  TM Distance: <3 FB     Dental  (+) Caps, Chipped   Pulmonary    Pulmonary exam normal        Cardiovascular hypertension, Pt. on medications and Pt. on home beta blockers Normal cardiovascular exam     Neuro/Psych TIACVA    GI/Hepatic negative GI ROS, Neg liver ROS,   Endo/Other  negative endocrine ROS  Renal/GU negative Renal ROS     Musculoskeletal  (+) Arthritis , Rheumatoid disorders,    Abdominal Normal abdominal exam  (+)   Peds  Hematology  (+) Blood dyscrasia, anemia ,   Anesthesia Other Findings Past Medical History: No date: CML (chronic myelocytic leukemia) (HCC)     Comment:  Onc at [name] No date: Heart disease No date: Hypertension No date: Leukemia ([place]) No date: RA (rheumatoid arthritis) (HCC) No date: Stroke ([place]) No date: Thalassemia  Reproductive/Obstetrics                             Anesthesia Physical Anesthesia Plan  ASA: IV  Anesthesia Plan: MAC and General   Post-op Pain Management:    Induction: Intravenous  PONV Risk Score and Plan:   Airway Management Planned: Nasal Cannula  Additional Equipment:   Intra-op Plan:   Post-operative Plan:   Informed Consent: I have reviewed the patients History and Physical, chart, labs and discussed the procedure including the risks, benefits and alternatives for the proposed anesthesia with the patient or authorized representative who has indicated his/her understanding and acceptance.   Dental advisory given  Plan Discussed with: CRNA and Surgeon  Anesthesia Plan Comments:         Anesthesia Quick Evaluation

## 2017-08-12 NOTE — Progress Notes (Signed)
PT Cancellation Note  Patient Details Name: SYLIVA MEE MRN: [id number] DOB: [birth year]   Cancelled Treatment:    Reason Eval/Treat Not Completed: Medical issues which prohibited therapy(Patient scheduled for kyphoplasty this date; will hold evaluation until procedure complete and patient cleared for activity.  Will continue to follow and initiate as appropriate.)   Govanni Plemons H. Owens Shark, PT, DPT, NCS 08/12/17, 8:33 AM [phone]

## 2017-08-12 NOTE — Op Note (Signed)
08/12/2017  1:47 PM  PATIENT:  Stephanie Harris  83 y.o. female  PRE-OPERATIVE DIAGNOSIS:  L1 Fracture  POST-OPERATIVE DIAGNOSIS:  L1 Fracture  PROCEDURE:  Procedure(s): KYPHOPLASTY (N/A)  SURGEON: Laurene Footman, MD  ASSISTANTS: None  ANESTHESIA:   local and MAC  EBL:  Total I/O In: 1000 [I.V.:1000] Out: 50 [Urine:50]  BLOOD ADMINISTERED:none  DRAINS: none   LOCAL MEDICATIONS USED:  MARCAINE    and XYLOCAINE   SPECIMEN:  No Specimen  DISPOSITION OF SPECIMEN:  N/A  COUNTS:  YES  TOURNIQUET:  * No tourniquets in log *  IMPLANTS: Bone cement  DICTATION: .Dragon Dictation patient brought the operating room and after adequate sedation was given patient was placed prone. C-arm was brought in and good visualization of all 1 was obtained in both AP and lateral projections. After patient identification and timeout procedure were completed 10 cc 1% Xylocaine was infiltrated on the right side in the area of planned incision. The back was then prepped and draped in the usual sterile manner and repeat timeout procedure carried out. Spinal needle was then used to get local anesthetic down to the pedicle on the right side with a 50-50 mix of 1% Xylocaine have percent Sensorcaine with epinephrine. After allowing this to set a small incision was made and a extrapedicular approach was utilized to get into the vertebral body. Biopsy was attempted but no specimen obtained. Drilling was carried out and then placement of a balloon inflation to about 2 cc and then when the cement was the appropriate consistency proximal before cc infiltrated into the vertebral body without extravasation with good fill to both sides. After the cement was adequately set the trochars removed and permanent C-arm views obtained and the wound closed with Dermabond followed by a Band-Aid  PLAN OF CARE: Back to her room  PATIENT DISPOSITION:  PACU - hemodynamically stable.

## 2017-08-12 NOTE — Progress Notes (Signed)
  PT Cancellation Note  Patient Details Name: Stephanie Harris MRN: [id number] DOB: 1931/04/18   Cancelled Treatment:    Reason Eval/Treat Not Completed: Patient declined, no reason specified. Pt now back from kypho this date. Evaluation attempted, however pt in severe pain and refusing therapy evaluation. Reports there is no way she can discharge this date as she has no help at home. Requesting to spend the night and SNF placement. Explained policy/procedure and need for evaluation, however pt continues to refuses and wants to hold off until next date. Updated RN and CSW. Will re-attempt.   Sierah Lacewell 08/12/2017, 4:51 PM Greggory Stallion, PT, DPT [phone]

## 2017-08-12 NOTE — Anesthesia Procedure Notes (Signed)
Date/Time: 08/12/2017 1:26 PM Performed by: Nelda Marseille, CRNA Pre-anesthesia Checklist: Patient identified, Emergency Drugs available, Suction available, Patient being monitored and Timeout performed Oxygen Delivery Method: Nasal cannula

## 2017-08-12 NOTE — Transfer of Care (Signed)
Immediate Anesthesia Transfer of Care Note  Patient: Stephanie Harris  Procedure(s) Performed: KYPHOPLASTY (N/A )  Patient Location: PACU  Anesthesia Type:General  Level of Consciousness: sedated  Airway & Oxygen Therapy: Patient Spontanous Breathing and Patient connected to nasal cannula oxygen  Post-op Assessment: Report given to RN and Post -op Vital signs reviewed and stable  Post vital signs: Reviewed and stable  Last Vitals:  Vitals:   08/12/17 0822 08/12/17 1145  BP: (!) 125/43 (!) 144/79  Pulse: 70 71  Resp:  16  Temp:  36.6 C  SpO2: 100% 98%    Last Pain:  Vitals:   08/12/17 1145  TempSrc: Tympanic  PainSc: 10-Worst pain ever      Patients Stated Pain Goal: 4 (51/83/43 7357)  Complications: No apparent anesthesia complications

## 2017-08-12 NOTE — Anesthesia Postprocedure Evaluation (Signed)
Anesthesia Post Note  Patient: Stephanie Harris  Procedure(s) Performed: KYPHOPLASTY (N/A )  Patient location during evaluation: PACU Anesthesia Type: MAC Level of consciousness: awake and alert and oriented Pain management: pain level controlled Vital Signs Assessment: post-procedure vital signs reviewed and stable Respiratory status: spontaneous breathing Cardiovascular status: blood pressure returned to baseline Anesthetic complications: no     Last Vitals:  Vitals:   08/12/17 1431 08/12/17 1440  BP: (!) 149/62 (!) 163/72  Pulse: 75   Resp: 12   Temp:  36.4 C  SpO2: 100%     Last Pain:  Vitals:   08/12/17 1440  TempSrc:   PainSc: 0-No pain                 Jakaria Lavergne

## 2017-08-12 NOTE — Discharge Instructions (Signed)
Resume diet and activity as before ° ° °

## 2017-08-12 NOTE — Progress Notes (Signed)
Have tried to call PT Cyril Mourning multiple times to inform patient is back from surgery and needs evaluation today pending possible d/c. Will continue to reach via phone. Wenda Low [facility]

## 2017-08-12 NOTE — Progress Notes (Signed)
TED hoses applied, per order. Stephanie Harris [facility]

## 2017-08-12 NOTE — OR Nursing (Signed)
Patient incontinent. Cleaned and pads changed. Instructed patient to request bed pan for voiding so she is not laying in wet linens and potentially causing herself a breakdown on her skin. Patient states that she usually wears a diaper and that is enough.

## 2017-08-13 ENCOUNTER — Encounter: Payer: Self-pay | Admitting: Orthopedic Surgery

## 2017-08-13 DIAGNOSIS — R079 Chest pain, unspecified: Secondary | ICD-10-CM | POA: Diagnosis not present

## 2017-08-13 DIAGNOSIS — I10 Essential (primary) hypertension: Secondary | ICD-10-CM | POA: Diagnosis not present

## 2017-08-13 DIAGNOSIS — M549 Dorsalgia, unspecified: Secondary | ICD-10-CM | POA: Diagnosis not present

## 2017-08-13 NOTE — Clinical Social Work Note (Signed)
CSW received referral for SNF.  Case discussed with case manager and plan is to discharge home with home health.  CSW to sign off please re-consult if social work needs arise.  Taji Sather R. Kanoa Phillippi, MSW, LCSWA [phone]  

## 2017-08-13 NOTE — Evaluation (Signed)
Physical Therapy Evaluation Patient Details Name: Stephanie Harris MRN: [id number] DOB: Sep 22, 1930 Today's Date: 08/13/2017   History of Present Illness  presented to ER secondary to chest pain, back pain; admitted for pain management related to T12 and L1 compression fracture, status post kypoplasty 08/02/17.  Clinical Impression  Upon evaluation, patient alert and oriented to basic information; follows simple commands but requires increased encouragement for participation with all functional activities.  Patient very hyper-verbal and focused/perseverative on multiple medical co-morbidities and health conditions/pains.  UE/LE strength and ROM generally guarded due to pain in back and hips, but able to move all throughout functional ROM.  Demonstrates ability to complete bed mobility with min assist; sit/stand, basic transfers and gait (61') with RW, cga/min assist.  Slow and guarded, but no overt buckling or LOB.  Limited receptiveness to therapist cuing, insistent instead that she complete movement the way she and her husband were doing prior to admission. Will continue to reinforce back precautions, safe movement patterns in subsequent visits. Would benefit from skilled PT to address above deficits and promote optimal return to PLOF; Recommend transition to [facility] and 24/7 sup/assist upon discharge from acute hospitalization.     Follow Up Recommendations Home health PT(HHOT, aide)    Equipment Recommendations  Rolling walker with 5" wheels;3in1 (PT)    Recommendations for Other Services       Precautions / Restrictions Precautions Precautions: Fall;Back Restrictions Weight Bearing Restrictions: Yes RLE Weight Bearing: Weight bearing as tolerated LLE Weight Bearing: Weight bearing as tolerated      Mobility  Bed Mobility Overal bed mobility: Needs Assistance Bed Mobility: Supine to Sit     Supine to sit: Min assist     General bed mobility comments: education in log rolling  technique; limited compliance with recommendations  Transfers Overall transfer level: Needs assistance Equipment used: Rolling walker (2 wheeled) Transfers: Sit to/from Stand Sit to Stand: Min assist         General transfer comment: cuing for hand placement; uses posterior surfaces of bilat LEs against edge of bed to assist with lift off.  Cuing for postural extension as tolerated  Ambulation/Gait Ambulation/Gait assistance: Min guard Ambulation Distance (Feet): 20 Feet Assistive device: Rolling walker (2 wheeled)       General Gait Details: decreased step height/length, slow and guarded cadence/gait speed; limited ability to move outside immediate BOS, but no overt buckling or LOB.  Self-limited additional distance due to pain.  Stairs            Wheelchair Mobility    Modified Rankin (Stroke Patients Only)       Balance Overall balance assessment: Needs assistance Sitting-balance support: No upper extremity supported;Feet supported Sitting balance-Leahy Scale: Good     Standing balance support: Bilateral upper extremity supported Standing balance-Leahy Scale: Fair                               Pertinent Vitals/Pain Pain Assessment: Faces Faces Pain Scale: Hurts whole lot Pain Location: back, bilat hips Pain Descriptors / Indicators: Aching;Grimacing;Guarding Pain Intervention(s): Limited activity within patient's tolerance;Monitored during session;Repositioned    Home Living Family/patient expects to be discharged to:: Private residence Living Arrangements: Spouse/significant other Available Help at Discharge: Family;Available 24 hours/day Type of Home: House Home Access: Ramped entrance     Home Layout: One level Home Equipment: Walker - 4 wheels;Shower seat;Bedside commode      Prior Function Level of Independence: Independent  with assistive device(s)         Comments: Mod indep for limited household mobility with 4WRW; husband  assists with ADLs, household chores as needed.     Hand Dominance        Extremity/Trunk Assessment   Upper Extremity Assessment Upper Extremity Assessment: Overall WFL for tasks assessed    Lower Extremity Assessment Lower Extremity Assessment: Generalized weakness(grossly 3-/5, guarded due to pain)       Communication   Communication: No difficulties  Cognition Arousal/Alertness: Awake/alert Behavior During Therapy: WFL for tasks assessed/performed Overall Cognitive Status: Within Functional Limits for tasks assessed                                        General Comments      Exercises Other Exercises Other Exercises: Sit/stand x4 with RW, cga/min assist-emphasis on handp lacement, safety and transfer mechanics Other Exercises: Reviewed log rolling and back precautions; patient/husband voiced understanding, though with limited compliance and integration into functional activities. W ill continue to reinforce.   Assessment/Plan    PT Assessment Patient needs continued PT services  PT Problem List Decreased strength;Decreased range of motion;Decreased activity tolerance;Decreased balance;Decreased mobility;Decreased knowledge of use of DME;Decreased safety awareness;Pain;Decreased knowledge of precautions       PT Treatment Interventions DME instruction;Gait training;Functional mobility training;Therapeutic activities;Therapeutic exercise;Balance training;Patient/family education    PT Goals (Current goals can be found in the Care Plan section)  Acute Rehab PT Goals Patient Stated Goal: to go home PT Goal Formulation: With patient Time For Goal Achievement: 08/27/17 Potential to Achieve Goals: Fair    Frequency Min 2X/week   Barriers to discharge        Co-evaluation               AM-PAC PT "6 Clicks" Daily Activity  Outcome Measure Difficulty turning over in bed (including adjusting bedclothes, sheets and blankets)?:  Unable Difficulty moving from lying on back to sitting on the side of the bed? : Unable Difficulty sitting down on and standing up from a chair with arms (e.g., wheelchair, bedside commode, etc,.)?: Unable Help needed moving to and from a bed to chair (including a wheelchair)?: A Little Help needed walking in hospital room?: A Little Help needed climbing 3-5 steps with a railing? : A Little 6 Click Score: 12    End of Session Equipment Utilized During Treatment: Gait belt Activity Tolerance: Patient tolerated treatment well Patient left: in chair;with call bell/phone within reach;with chair alarm set;with family/visitor present Nurse Communication: Mobility status PT Visit Diagnosis: Unsteadiness on feet (R26.81);Muscle weakness (generalized) (M62.81);Difficulty in walking, not elsewhere classified (R26.2);Pain    Time: 6979-4801 PT Time Calculation (min) (ACUTE ONLY): 48 min   Charges:   PT Evaluation $PT Eval Moderate Complexity: 1 Mod PT Treatments $Therapeutic Activity: 23-37 mins   PT G Codes:   PT G-Codes **NOT FOR INPATIENT CLASS** Functional Assessment Tool Used: AM-PAC 6 Clicks Basic Mobility Functional Limitation: Mobility: Walking and moving around Mobility: Walking and Moving Around Current Status (K5537): At least 40 percent but less than 60 percent impaired, limited or restricted Mobility: Walking and Moving Around Goal Status [phone]): At least 1 percent but less than 20 percent impaired, limited or restricted    Calissa Swenor H. Owens Shark, PT, DPT, NCS 08/13/17, 4:45 PM [phone]

## 2017-08-13 NOTE — Progress Notes (Signed)
Ardelle Lesches to be D/C'd Home with home health per MD order.  Discussed prescriptions and follow up appointments with the patient. Prescriptions given to patient, medication list explained in detail. Pt verbalized understanding.  Allergies as of 08/13/2017      Reactions   Latex Rash   Penicillins Other (See Comments)   Has patient had a PCN reaction causing immediate rash, facial/tongue/throat swelling, SOB or lightheadedness with hypotension: Unknown Has patient had a PCN reaction causing severe rash involving mucus membranes or skin necrosis: Unknown Has patient had a PCN reaction that required hospitalization: Unknown Has patient had a PCN reaction occurring within the last 10 years: Unknown If all of the above answers are "NO", then may proceed with Cephalosporin use.      Medication List    TAKE these medications   ascorbic acid 1000 MG tablet Commonly known as:  VITAMIN C Take 1,000 mg by mouth daily.   aspirin 81 MG EC tablet Take 81 mg by mouth daily.   carvedilol 3.125 MG tablet Commonly known as:  COREG Take 6.25 mg by mouth 2 (two) times daily.   cholecalciferol 1000 units tablet Commonly known as:  VITAMIN D Take 1,000 Units by mouth daily.   cyanocobalamin 1000 MCG/ML injection Commonly known as:  (VITAMIN B-12) Inject 1,000 mcg into the muscle every 30 (thirty) days.   docusate sodium 100 MG capsule Commonly known as:  COLACE Take 1 capsule (100 mg total) by mouth daily as needed. What changed:  reasons to take this   epoetin alfa 10000 UNIT/ML injection Commonly known as:  EPOGEN,PROCRIT Inject 10,000 Units into the skin every 14 (fourteen) days.   folic acid 1 MG tablet Commonly known as:  FOLVITE Take 1 mg by mouth daily.   levothyroxine 50 MCG tablet Commonly known as:  SYNTHROID, LEVOTHROID Take 50 mcg by mouth daily. On the opposite days from taking 8mcg.   ondansetron 4 MG disintegrating tablet Commonly known as:  ZOFRAN ODT Take 1  tablet (4 mg total) by mouth every 8 (eight) hours as needed for nausea or vomiting.   oxyCODONE-acetaminophen 5-325 MG tablet Commonly known as:  PERCOCET/ROXICET Take 1 tablet by mouth every 6 (six) hours as needed for severe pain.   polyethylene glycol packet Commonly known as:  MIRALAX / GLYCOLAX Take 17 g by mouth daily as needed for mild constipation.   vitamin E 400 UNIT capsule Take 400 Units by mouth daily.       Vitals:   08/13/17 0413 08/13/17 0803  BP: (!) 148/56 (!) 158/67  Pulse: 75 76  Resp: 18 18  Temp: 97.8 F (36.6 C)   SpO2: 100% 96%    Tele box removed and returned.Skin clean, dry and intact without evidence of skin break down, no evidence of skin tears noted. IV catheter discontinued intact. Site without signs and symptoms of complications. Dressing and pressure applied. Pt denies pain at this time. No complaints noted.  An After Visit Summary was printed and given to the patient. Patient escorted via [place], and D/C home via private auto.  Rolley Sims

## 2017-08-13 NOTE — Care Management (Signed)
Kyphoplasty 12/20.  Patient declined physical therapy 12/20 due to pain. She is currently under observation and does not qualify for medicare reimbursement snf stay.

## 2017-08-13 NOTE — Care Management (Signed)
Patient has no agency preference for home health.  Voices concerns regarding: staff visiting on the scheduled days and not changing schedules and staff not spending all of their time talking to her husband instead of spending their time with "me the patient."  Referral for PT OT Aide and SW to [facility].  Informed can be seen 12/22. Patient says her husband will transport home.

## 2017-08-19 NOTE — Care Management (Signed)
Received call from patient stating that she has not been seen by home health yet.  Stated was contacted by Raquel Sarna with [facility] and waited all day for visit but no one ever came.  Spoke with Corene Cornea with [facility] who will investigate .  Informed that patient had requested [facility] not to come until Thursday or Friday (which is today or tomorrow). Agency is currently contacting patient .

## 2017-08-19 NOTE — Progress Notes (Signed)
[facility] at [place] NAME: Stephanie Harris    MR#:  [id number]  DATE OF BIRTH:  1931-05-25  SUBJECTIVE:  CHIEF COMPLAINT:   Chief Complaint  Patient presents with  . Chest Pain   No further chest pain.  Back pain persistent.  REVIEW OF SYSTEMS:    Review of Systems  Constitutional: Positive for malaise/fatigue. Negative for chills and fever.  HENT: Negative for sore throat.   Eyes: Negative for blurred vision, double vision and pain.  Respiratory: Negative for cough, hemoptysis, shortness of breath and wheezing.   Cardiovascular: Negative for chest pain, palpitations, orthopnea and leg swelling.  Gastrointestinal: Negative for abdominal pain, constipation, diarrhea, heartburn, nausea and vomiting.  Genitourinary: Negative for dysuria and hematuria.  Musculoskeletal: Positive for back pain. Negative for joint pain.  Skin: Negative for rash.  Neurological: Negative for sensory change, speech change, focal weakness and headaches.  Endo/Heme/Allergies: Does not bruise/bleed easily.  Psychiatric/Behavioral: Negative for depression. The patient is not nervous/anxious.     DRUG ALLERGIES:   Allergies  Allergen Reactions  . Latex Rash  . Penicillins Other (See Comments)    Has patient had a PCN reaction causing immediate rash, facial/tongue/throat swelling, SOB or lightheadedness with hypotension: Unknown Has patient had a PCN reaction causing severe rash involving mucus membranes or skin necrosis: Unknown Has patient had a PCN reaction that required hospitalization: Unknown Has patient had a PCN reaction occurring within the last 10 years: Unknown If all of the above answers are "NO", then may proceed with Cephalosporin use.     VITALS:  Blood pressure (!) 158/67, pulse 76, temperature 97.8 F (36.6 C), resp. rate 18, height 5' (1.524 m), weight 47.6 kg (104 lb 14.4 oz), SpO2 96 %.  PHYSICAL EXAMINATION:   Physical Exam  GENERAL:   83 y.o.-year-old patient lying in the bed with no acute distress.  EYES: Pupils equal, round, reactive to light and accommodation. No scleral icterus. Extraocular muscles intact.  HEENT: Head atraumatic, normocephalic. Oropharynx and nasopharynx clear.  NECK:  Supple, no jugular venous distention. No thyroid enlargement, no tenderness.  LUNGS: Normal breath sounds bilaterally, no wheezing, rales, rhonchi. No use of accessory muscles of respiration.  CARDIOVASCULAR: S1, S2 normal. No murmurs, rubs, or gallops.  ABDOMEN: Soft, nontender, nondistended. Bowel sounds present. No organomegaly or mass.  EXTREMITIES: No cyanosis, clubbing or edema b/l.    NEUROLOGIC: Cranial nerves II through XII are intact. No focal Motor or sensory deficits b/l.   PSYCHIATRIC: The patient is alert and oriented x 3.  SKIN: No obvious rash, lesion, or ulcer.   LABORATORY PANEL:   CBC No results for input(s): WBC, HGB, HCT, PLT in the last 168 hours. ------------------------------------------------------------------------------------------------------------------ Chemistries  No results for input(s): NA, K, CL, CO2, GLUCOSE, BUN, CREATININE, CALCIUM, MG, AST, ALT, ALKPHOS, BILITOT in the last 168 hours.  Invalid input(s): GFRCGP ------------------------------------------------------------------------------------------------------------------  Cardiac Enzymes No results for input(s): TROPONINI in the last 168 hours. ------------------------------------------------------------------------------------------------------------------  RADIOLOGY:  No results found.   ASSESSMENT AND PLAN:    * Chest pain. Non cardiac. Pleuritic likely musculoskeletal. Troponin/EHG normal  * Back pain with T12-L1 compression fractures MRI with L1 acute fracture.  Status post kyphoplasty.  Still has some pain.  Not want to work with physical therapy today.  Will reevaluate in the morning. Likely discharge in the morning with  pain medications.  * CML Follows at cancer center  * Constipation Miralax and enema Resolved    All  the records are reviewed and case discussed with Care Management/Social Workerr. Management plans discussed with the patient, family and they are in agreement.  CODE STATUS: FULL CODE  DVT Prophylaxis: SCDs  TOTAL TIME TAKING CARE OF THIS PATIENT: 30 minutes.   POSSIBLE D/C IN 1-2 DAYS, DEPENDING ON CLINICAL CONDITION.  Leia Alf Canna Nickelson M.D on 08/19/2017 at 4:43 PM  Between 7am to 6pm - Pager - [phone]  After 6pm go to [email or web address] - password [email or web address] [facility]  Office  [phone]  CC: Primary care physician; [name], MD  Note: This dictation was prepared with Dragon dictation along with smaller phrase technology. Any transcriptional errors that result from this process are unintentional.

## 2017-08-19 NOTE — Discharge Summary (Signed)
[facility] at [place] NAME: Stephanie Harris    MR#:  [id number]  DATE OF BIRTH:  1930-10-20  DATE OF ADMISSION:  08/11/2017 ADMITTING PHYSICIAN: Hillary Bow, MD  DATE OF DISCHARGE: 08/13/2017  5:48 PM  PRIMARY CARE PHYSICIAN: [name], MD   ADMISSION DIAGNOSIS:  Unstable angina ([place]) [I20.0] Essential hypertension [I10] Chest pain, unspecified type [R07.9] Acute midline back pain, unspecified back location [M54.9]  DISCHARGE DIAGNOSIS:  Active Problems:   Chest pain   SECONDARY DIAGNOSIS:   Past Medical History:  Diagnosis Date  . CML (chronic myelocytic leukemia) ([place])    Onc at [facility]  . Heart disease   . Hypertension   . Leukemia ([place])   . RA (rheumatoid arthritis) ([place])   . Stroke ([place])   . Thalassemia      ADMITTING HISTORY  HISTORY OF PRESENT ILLNESS:  Stephanie Harris  is a 83 y.o. female with a known history of CML, HTN, gait abnormality here with chest pain and back pain. She has had 4 recent ED visits for back pain.  Imaging showed T12 and L1 chronic compression fractures. Pain was being controlled by Percocet but this was stopped due to constipation.  Today her chest pain was pressure-like and tenderness on the chest.  No shortness of breath.  Had some sweating.  Had normal stress test 2 years back.  No exertional shortness of breath.  HOSPITAL COURSE:   *L1 compression fracture.  Patient had kyphoplasty with improvement in pain.  She does have chronic T12 compression fracture which continues to cause some problems with pain.  With significantly improved pain she is being discharged home with pain medications.  Home health set up.  *Chest pain was musculoskeletal.  EKG normal.  No arrhythmias on telemetry.  Troponin normal.  Constipation resolved.  Stable for discharge home with home health.   CONSULTS OBTAINED:    DRUG ALLERGIES:   Allergies  Allergen Reactions  . Latex Rash  . Penicillins Other  (See Comments)    Has patient had a PCN reaction causing immediate rash, facial/tongue/throat swelling, SOB or lightheadedness with hypotension: Unknown Has patient had a PCN reaction causing severe rash involving mucus membranes or skin necrosis: Unknown Has patient had a PCN reaction that required hospitalization: Unknown Has patient had a PCN reaction occurring within the last 10 years: Unknown If all of the above answers are "NO", then may proceed with Cephalosporin use.     DISCHARGE MEDICATIONS:   Allergies as of 08/13/2017      Reactions   Latex Rash   Penicillins Other (See Comments)   Has patient had a PCN reaction causing immediate rash, facial/tongue/throat swelling, SOB or lightheadedness with hypotension: Unknown Has patient had a PCN reaction causing severe rash involving mucus membranes or skin necrosis: Unknown Has patient had a PCN reaction that required hospitalization: Unknown Has patient had a PCN reaction occurring within the last 10 years: Unknown If all of the above answers are "NO", then may proceed with Cephalosporin use.      Medication List    TAKE these medications   ascorbic acid 1000 MG tablet Commonly known as:  VITAMIN C Take 1,000 mg by mouth daily.   aspirin 81 MG EC tablet Take 81 mg by mouth daily.   carvedilol 3.125 MG tablet Commonly known as:  COREG Take 6.25 mg by mouth 2 (two) times daily.   cholecalciferol 1000 units tablet Commonly known as:  VITAMIN D Take  1,000 Units by mouth daily.   cyanocobalamin 1000 MCG/ML injection Commonly known as:  (VITAMIN B-12) Inject 1,000 mcg into the muscle every 30 (thirty) days.   docusate sodium 100 MG capsule Commonly known as:  COLACE Take 1 capsule (100 mg total) by mouth daily as needed. What changed:  reasons to take this   epoetin alfa 10000 UNIT/ML injection Commonly known as:  EPOGEN,PROCRIT Inject 10,000 Units into the skin every 14 (fourteen) days.   folic acid 1 MG  tablet Commonly known as:  FOLVITE Take 1 mg by mouth daily.   levothyroxine 50 MCG tablet Commonly known as:  SYNTHROID, LEVOTHROID Take 50 mcg by mouth daily. On the opposite days from taking 16mcg.   ondansetron 4 MG disintegrating tablet Commonly known as:  ZOFRAN ODT Take 1 tablet (4 mg total) by mouth every 8 (eight) hours as needed for nausea or vomiting.   oxyCODONE-acetaminophen 5-325 MG tablet Commonly known as:  PERCOCET/ROXICET Take 1 tablet by mouth every 6 (six) hours as needed for severe pain.   polyethylene glycol packet Commonly known as:  MIRALAX / GLYCOLAX Take 17 g by mouth daily as needed for mild constipation.   vitamin E 400 UNIT capsule Take 400 Units by mouth daily.       Today   VITAL SIGNS:  Blood pressure (!) 158/67, pulse 76, temperature 97.8 F (36.6 C), resp. rate 18, height 5' (1.524 m), weight 47.6 kg (104 lb 14.4 oz), SpO2 96 %.  I/O:  No intake or output data in the 24 hours ending 08/19/17 1641  PHYSICAL EXAMINATION:  Physical Exam  GENERAL:  83 y.o.-year-old patient lying in the bed with no acute distress.  LUNGS: Normal breath sounds bilaterally, no wheezing, rales,rhonchi or crepitation. No use of accessory muscles of respiration.  CARDIOVASCULAR: S1, S2 normal. No murmurs, rubs, or gallops.  ABDOMEN: Soft, non-tender, non-distended. Bowel sounds present. No organomegaly or mass.  NEUROLOGIC: Moves all 4 extremities. PSYCHIATRIC: The patient is alert and oriented x 3.  SKIN: No obvious rash, lesion, or ulcer.   DATA REVIEW:   CBC No results for input(s): WBC, HGB, HCT, PLT in the last 168 hours.  Chemistries  No results for input(s): NA, K, CL, CO2, GLUCOSE, BUN, CREATININE, CALCIUM, MG, AST, ALT, ALKPHOS, BILITOT in the last 168 hours.  Invalid input(s): GFRCGP  Cardiac Enzymes No results for input(s): TROPONINI in the last 168 hours.  Microbiology Results  Results for orders placed or performed during the hospital  encounter of 12/08/16  Urine culture     Status: Abnormal   Collection Time: 12/08/16  7:41 AM  Result Value Ref Range Status   Specimen Description URINE, CATHETERIZED  Final   Special Requests NONE  Final   Culture >=100,000 COLONIES/mL ENTEROBACTER SPECIES (A)  Final   Report Status 12/11/2016 FINAL  Final   Organism ID, Bacteria ENTEROBACTER SPECIES (A)  Final      Susceptibility   Enterobacter species - MIC*    CEFAZOLIN >=64 RESISTANT Resistant     CEFTRIAXONE <=1 SENSITIVE Sensitive     CIPROFLOXACIN <=0.25 SENSITIVE Sensitive     GENTAMICIN <=1 SENSITIVE Sensitive     IMIPENEM <=0.25 SENSITIVE Sensitive     NITROFURANTOIN 64 INTERMEDIATE Intermediate     TRIMETH/SULFA <=20 SENSITIVE Sensitive     PIP/TAZO <=4 SENSITIVE Sensitive     * >=100,000 COLONIES/mL ENTEROBACTER SPECIES    RADIOLOGY:  No results found.  Follow up with PCP in 1 week.  Management  plans discussed with the patient, family and they are in agreement.  CODE STATUS:  Code Status History    Date Active Date Inactive Code Status Order ID Comments User Context   08/11/2017 07:33 08/13/2017 20:53 Full Code [id number]  Hillary Bow, MD ED   04/28/2016 18:20 04/30/2016 17:21 Full Code [id number]  [name], MD Inpatient   03/14/2015 12:55 03/15/2015 18:08 Full Code [id number]  [name], MD Inpatient   01/29/2015 05:27 01/30/2015 17:24 Full Code [id number]  Harrie Foreman, MD Inpatient      TOTAL TIME TAKING CARE OF THIS PATIENT ON DAY OF DISCHARGE: more than 30 minutes.   Stephanie Harris M.D on 08/19/2017 at 4:41 PM  Between 7am to 6pm - Pager - [phone]  After 6pm go to [email or web address] - password [email or web address] [facility]  Office  [phone]  CC: Primary care physician; [name], MD  Note: This dictation was prepared with Dragon dictation along with smaller phrase technology. Any transcriptional errors that result from this process are unintentional.

## 2017-08-20 DIAGNOSIS — Z955 Presence of coronary angioplasty implant and graft: Secondary | ICD-10-CM | POA: Diagnosis not present

## 2017-08-20 DIAGNOSIS — S32010D Wedge compression fracture of first lumbar vertebra, subsequent encounter for fracture with routine healing: Secondary | ICD-10-CM | POA: Diagnosis not present

## 2017-08-20 DIAGNOSIS — S22080D Wedge compression fracture of T11-T12 vertebra, subsequent encounter for fracture with routine healing: Secondary | ICD-10-CM | POA: Diagnosis not present

## 2017-08-20 DIAGNOSIS — M069 Rheumatoid arthritis, unspecified: Secondary | ICD-10-CM | POA: Diagnosis not present

## 2017-08-20 DIAGNOSIS — Z7982 Long term (current) use of aspirin: Secondary | ICD-10-CM | POA: Diagnosis not present

## 2017-08-20 DIAGNOSIS — C92Z0 Other myeloid leukemia not having achieved remission: Secondary | ICD-10-CM | POA: Diagnosis not present

## 2017-08-20 DIAGNOSIS — Z96651 Presence of right artificial knee joint: Secondary | ICD-10-CM | POA: Diagnosis not present

## 2017-08-20 DIAGNOSIS — I119 Hypertensive heart disease without heart failure: Secondary | ICD-10-CM | POA: Diagnosis not present

## 2017-08-20 DIAGNOSIS — Z8673 Personal history of transient ischemic attack (TIA), and cerebral infarction without residual deficits: Secondary | ICD-10-CM | POA: Diagnosis not present

## 2017-08-20 DIAGNOSIS — Z9181 History of falling: Secondary | ICD-10-CM | POA: Diagnosis not present

## 2017-08-23 DIAGNOSIS — S32010D Wedge compression fracture of first lumbar vertebra, subsequent encounter for fracture with routine healing: Secondary | ICD-10-CM | POA: Diagnosis not present

## 2017-08-23 DIAGNOSIS — I119 Hypertensive heart disease without heart failure: Secondary | ICD-10-CM | POA: Diagnosis not present

## 2017-08-23 DIAGNOSIS — M069 Rheumatoid arthritis, unspecified: Secondary | ICD-10-CM | POA: Diagnosis not present

## 2017-08-23 DIAGNOSIS — C92Z0 Other myeloid leukemia not having achieved remission: Secondary | ICD-10-CM | POA: Diagnosis not present

## 2017-08-23 DIAGNOSIS — S22080D Wedge compression fracture of T11-T12 vertebra, subsequent encounter for fracture with routine healing: Secondary | ICD-10-CM | POA: Diagnosis not present

## 2017-08-23 DIAGNOSIS — Z8673 Personal history of transient ischemic attack (TIA), and cerebral infarction without residual deficits: Secondary | ICD-10-CM | POA: Diagnosis not present

## 2017-08-26 DIAGNOSIS — G8929 Other chronic pain: Secondary | ICD-10-CM | POA: Diagnosis not present

## 2017-08-26 DIAGNOSIS — S32010G Wedge compression fracture of first lumbar vertebra, subsequent encounter for fracture with delayed healing: Secondary | ICD-10-CM | POA: Diagnosis not present

## 2017-08-26 DIAGNOSIS — M545 Low back pain: Secondary | ICD-10-CM | POA: Diagnosis not present

## 2017-08-26 DIAGNOSIS — Z9889 Other specified postprocedural states: Secondary | ICD-10-CM | POA: Diagnosis not present

## 2017-08-26 DIAGNOSIS — R531 Weakness: Secondary | ICD-10-CM | POA: Diagnosis not present

## 2017-08-26 DIAGNOSIS — M5136 Other intervertebral disc degeneration, lumbar region: Secondary | ICD-10-CM | POA: Diagnosis not present

## 2017-08-27 DIAGNOSIS — Z8673 Personal history of transient ischemic attack (TIA), and cerebral infarction without residual deficits: Secondary | ICD-10-CM | POA: Diagnosis not present

## 2017-08-27 DIAGNOSIS — I119 Hypertensive heart disease without heart failure: Secondary | ICD-10-CM | POA: Diagnosis not present

## 2017-08-27 DIAGNOSIS — S22080D Wedge compression fracture of T11-T12 vertebra, subsequent encounter for fracture with routine healing: Secondary | ICD-10-CM | POA: Diagnosis not present

## 2017-08-27 DIAGNOSIS — C92Z0 Other myeloid leukemia not having achieved remission: Secondary | ICD-10-CM | POA: Diagnosis not present

## 2017-08-27 DIAGNOSIS — S32010D Wedge compression fracture of first lumbar vertebra, subsequent encounter for fracture with routine healing: Secondary | ICD-10-CM | POA: Diagnosis not present

## 2017-08-27 DIAGNOSIS — M069 Rheumatoid arthritis, unspecified: Secondary | ICD-10-CM | POA: Diagnosis not present

## 2017-08-29 DIAGNOSIS — S32010G Wedge compression fracture of first lumbar vertebra, subsequent encounter for fracture with delayed healing: Secondary | ICD-10-CM | POA: Insufficient documentation

## 2017-08-29 DIAGNOSIS — M545 Low back pain: Secondary | ICD-10-CM

## 2017-08-29 DIAGNOSIS — G8929 Other chronic pain: Secondary | ICD-10-CM | POA: Insufficient documentation

## 2017-09-02 DIAGNOSIS — C92Z0 Other myeloid leukemia not having achieved remission: Secondary | ICD-10-CM | POA: Diagnosis not present

## 2017-09-02 DIAGNOSIS — I119 Hypertensive heart disease without heart failure: Secondary | ICD-10-CM | POA: Diagnosis not present

## 2017-09-02 DIAGNOSIS — Z8673 Personal history of transient ischemic attack (TIA), and cerebral infarction without residual deficits: Secondary | ICD-10-CM | POA: Diagnosis not present

## 2017-09-02 DIAGNOSIS — M069 Rheumatoid arthritis, unspecified: Secondary | ICD-10-CM | POA: Diagnosis not present

## 2017-09-02 DIAGNOSIS — S32010D Wedge compression fracture of first lumbar vertebra, subsequent encounter for fracture with routine healing: Secondary | ICD-10-CM | POA: Diagnosis not present

## 2017-09-02 DIAGNOSIS — S22080D Wedge compression fracture of T11-T12 vertebra, subsequent encounter for fracture with routine healing: Secondary | ICD-10-CM | POA: Diagnosis not present

## 2017-09-03 DIAGNOSIS — S22080D Wedge compression fracture of T11-T12 vertebra, subsequent encounter for fracture with routine healing: Secondary | ICD-10-CM | POA: Diagnosis not present

## 2017-09-03 DIAGNOSIS — Z8673 Personal history of transient ischemic attack (TIA), and cerebral infarction without residual deficits: Secondary | ICD-10-CM | POA: Diagnosis not present

## 2017-09-03 DIAGNOSIS — S32010D Wedge compression fracture of first lumbar vertebra, subsequent encounter for fracture with routine healing: Secondary | ICD-10-CM | POA: Diagnosis not present

## 2017-09-03 DIAGNOSIS — M069 Rheumatoid arthritis, unspecified: Secondary | ICD-10-CM | POA: Diagnosis not present

## 2017-09-03 DIAGNOSIS — C92Z0 Other myeloid leukemia not having achieved remission: Secondary | ICD-10-CM | POA: Diagnosis not present

## 2017-09-03 DIAGNOSIS — I119 Hypertensive heart disease without heart failure: Secondary | ICD-10-CM | POA: Diagnosis not present

## 2017-09-09 DIAGNOSIS — M545 Low back pain: Secondary | ICD-10-CM | POA: Diagnosis not present

## 2017-09-09 DIAGNOSIS — Z79899 Other long term (current) drug therapy: Secondary | ICD-10-CM | POA: Diagnosis not present

## 2017-09-09 DIAGNOSIS — S32010G Wedge compression fracture of first lumbar vertebra, subsequent encounter for fracture with delayed healing: Secondary | ICD-10-CM | POA: Diagnosis not present

## 2017-09-09 DIAGNOSIS — M533 Sacrococcygeal disorders, not elsewhere classified: Secondary | ICD-10-CM | POA: Diagnosis not present

## 2017-09-09 DIAGNOSIS — D649 Anemia, unspecified: Secondary | ICD-10-CM | POA: Diagnosis not present

## 2017-09-15 DIAGNOSIS — M533 Sacrococcygeal disorders, not elsewhere classified: Secondary | ICD-10-CM | POA: Diagnosis not present

## 2017-09-15 DIAGNOSIS — M461 Sacroiliitis, not elsewhere classified: Secondary | ICD-10-CM | POA: Diagnosis not present

## 2017-09-24 DIAGNOSIS — N183 Chronic kidney disease, stage 3 (moderate): Secondary | ICD-10-CM | POA: Diagnosis not present

## 2017-09-24 DIAGNOSIS — C9110 Chronic lymphocytic leukemia of B-cell type not having achieved remission: Secondary | ICD-10-CM | POA: Diagnosis not present

## 2017-09-24 DIAGNOSIS — D631 Anemia in chronic kidney disease: Secondary | ICD-10-CM | POA: Diagnosis not present

## 2017-09-24 DIAGNOSIS — D630 Anemia in neoplastic disease: Secondary | ICD-10-CM | POA: Diagnosis not present

## 2017-09-27 DIAGNOSIS — M47816 Spondylosis without myelopathy or radiculopathy, lumbar region: Secondary | ICD-10-CM | POA: Diagnosis not present

## 2017-09-30 DIAGNOSIS — C91Z0 Other lymphoid leukemia not having achieved remission: Secondary | ICD-10-CM | POA: Diagnosis not present

## 2017-09-30 DIAGNOSIS — E1122 Type 2 diabetes mellitus with diabetic chronic kidney disease: Secondary | ICD-10-CM | POA: Diagnosis not present

## 2017-09-30 DIAGNOSIS — I2510 Atherosclerotic heart disease of native coronary artery without angina pectoris: Secondary | ICD-10-CM | POA: Diagnosis not present

## 2017-09-30 DIAGNOSIS — Z9181 History of falling: Secondary | ICD-10-CM | POA: Diagnosis not present

## 2017-09-30 DIAGNOSIS — S32010G Wedge compression fracture of first lumbar vertebra, subsequent encounter for fracture with delayed healing: Secondary | ICD-10-CM | POA: Diagnosis not present

## 2017-09-30 DIAGNOSIS — I129 Hypertensive chronic kidney disease with stage 1 through stage 4 chronic kidney disease, or unspecified chronic kidney disease: Secondary | ICD-10-CM | POA: Diagnosis not present

## 2017-09-30 DIAGNOSIS — M533 Sacrococcygeal disorders, not elsewhere classified: Secondary | ICD-10-CM | POA: Diagnosis not present

## 2017-09-30 DIAGNOSIS — N189 Chronic kidney disease, unspecified: Secondary | ICD-10-CM | POA: Diagnosis not present

## 2017-09-30 DIAGNOSIS — D631 Anemia in chronic kidney disease: Secondary | ICD-10-CM | POA: Diagnosis not present

## 2017-09-30 DIAGNOSIS — M47812 Spondylosis without myelopathy or radiculopathy, cervical region: Secondary | ICD-10-CM | POA: Diagnosis not present

## 2017-09-30 DIAGNOSIS — M069 Rheumatoid arthritis, unspecified: Secondary | ICD-10-CM | POA: Diagnosis not present

## 2017-09-30 DIAGNOSIS — Z7984 Long term (current) use of oral hypoglycemic drugs: Secondary | ICD-10-CM | POA: Diagnosis not present

## 2017-09-30 DIAGNOSIS — Z8673 Personal history of transient ischemic attack (TIA), and cerebral infarction without residual deficits: Secondary | ICD-10-CM | POA: Diagnosis not present

## 2017-10-05 DIAGNOSIS — I2510 Atherosclerotic heart disease of native coronary artery without angina pectoris: Secondary | ICD-10-CM | POA: Diagnosis not present

## 2017-10-05 DIAGNOSIS — M533 Sacrococcygeal disorders, not elsewhere classified: Secondary | ICD-10-CM | POA: Diagnosis not present

## 2017-10-05 DIAGNOSIS — E1122 Type 2 diabetes mellitus with diabetic chronic kidney disease: Secondary | ICD-10-CM | POA: Diagnosis not present

## 2017-10-05 DIAGNOSIS — M069 Rheumatoid arthritis, unspecified: Secondary | ICD-10-CM | POA: Diagnosis not present

## 2017-10-05 DIAGNOSIS — I129 Hypertensive chronic kidney disease with stage 1 through stage 4 chronic kidney disease, or unspecified chronic kidney disease: Secondary | ICD-10-CM | POA: Diagnosis not present

## 2017-10-05 DIAGNOSIS — S32010G Wedge compression fracture of first lumbar vertebra, subsequent encounter for fracture with delayed healing: Secondary | ICD-10-CM | POA: Diagnosis not present

## 2017-10-06 ENCOUNTER — Telehealth: Payer: Self-pay | Admitting: Internal Medicine

## 2017-10-06 NOTE — Telephone Encounter (Signed)
SIGNED PLAN OF CARE FAXED TO [facility] AND PUT INTO THEIR FOLDER TO BE PICKED UP. ° °

## 2017-10-07 DIAGNOSIS — M47816 Spondylosis without myelopathy or radiculopathy, lumbar region: Secondary | ICD-10-CM | POA: Diagnosis not present

## 2017-10-07 DIAGNOSIS — D631 Anemia in chronic kidney disease: Secondary | ICD-10-CM | POA: Diagnosis not present

## 2017-10-07 DIAGNOSIS — M4856XS Collapsed vertebra, not elsewhere classified, lumbar region, sequela of fracture: Secondary | ICD-10-CM | POA: Diagnosis not present

## 2017-10-07 DIAGNOSIS — N183 Chronic kidney disease, stage 3 (moderate): Secondary | ICD-10-CM | POA: Diagnosis not present

## 2017-10-08 DIAGNOSIS — M47816 Spondylosis without myelopathy or radiculopathy, lumbar region: Secondary | ICD-10-CM | POA: Insufficient documentation

## 2017-10-18 DIAGNOSIS — M47816 Spondylosis without myelopathy or radiculopathy, lumbar region: Secondary | ICD-10-CM | POA: Diagnosis not present

## 2017-10-21 DIAGNOSIS — D649 Anemia, unspecified: Secondary | ICD-10-CM | POA: Diagnosis not present

## 2017-10-21 DIAGNOSIS — N183 Chronic kidney disease, stage 3 (moderate): Secondary | ICD-10-CM | POA: Diagnosis not present

## 2017-10-21 DIAGNOSIS — D630 Anemia in neoplastic disease: Secondary | ICD-10-CM | POA: Diagnosis not present

## 2017-10-21 DIAGNOSIS — C9110 Chronic lymphocytic leukemia of B-cell type not having achieved remission: Secondary | ICD-10-CM | POA: Diagnosis not present

## 2017-10-21 DIAGNOSIS — C91Z0 Other lymphoid leukemia not having achieved remission: Secondary | ICD-10-CM | POA: Diagnosis not present

## 2017-10-21 DIAGNOSIS — D631 Anemia in chronic kidney disease: Secondary | ICD-10-CM | POA: Diagnosis not present

## 2017-11-04 ENCOUNTER — Emergency Department
Admission: EM | Admit: 2017-11-04 | Discharge: 2017-11-04 | Disposition: A | Discharge status: Home / Self Care | Payer: Medicare Other | Source: Home / Self Care | Attending: Emergency Medicine | Admitting: Emergency Medicine

## 2017-11-04 ENCOUNTER — Other Ambulatory Visit: Payer: Self-pay

## 2017-11-04 ENCOUNTER — Emergency Department: Payer: Medicare Other

## 2017-11-04 ENCOUNTER — Encounter: Payer: Self-pay | Admitting: Emergency Medicine

## 2017-11-04 DIAGNOSIS — Z955 Presence of coronary angioplasty implant and graft: Secondary | ICD-10-CM

## 2017-11-04 DIAGNOSIS — K5900 Constipation, unspecified: Secondary | ICD-10-CM | POA: Diagnosis present

## 2017-11-04 DIAGNOSIS — D569 Thalassemia, unspecified: Secondary | ICD-10-CM | POA: Diagnosis present

## 2017-11-04 DIAGNOSIS — M4856XA Collapsed vertebra, not elsewhere classified, lumbar region, initial encounter for fracture: Secondary | ICD-10-CM | POA: Diagnosis not present

## 2017-11-04 DIAGNOSIS — Z8673 Personal history of transient ischemic attack (TIA), and cerebral infarction without residual deficits: Secondary | ICD-10-CM

## 2017-11-04 DIAGNOSIS — I10 Essential (primary) hypertension: Secondary | ICD-10-CM

## 2017-11-04 DIAGNOSIS — Y999 Unspecified external cause status: Secondary | ICD-10-CM | POA: Insufficient documentation

## 2017-11-04 DIAGNOSIS — Z681 Body mass index (BMI) 19 or less, adult: Secondary | ICD-10-CM | POA: Diagnosis not present

## 2017-11-04 DIAGNOSIS — Z96651 Presence of right artificial knee joint: Secondary | ICD-10-CM | POA: Diagnosis present

## 2017-11-04 DIAGNOSIS — M549 Dorsalgia, unspecified: Secondary | ICD-10-CM | POA: Diagnosis not present

## 2017-11-04 DIAGNOSIS — I1310 Hypertensive heart and chronic kidney disease without heart failure, with stage 1 through stage 4 chronic kidney disease, or unspecified chronic kidney disease: Secondary | ICD-10-CM | POA: Diagnosis not present

## 2017-11-04 DIAGNOSIS — R402413 Glasgow coma scale score 13-15, at hospital admission: Secondary | ICD-10-CM | POA: Diagnosis present

## 2017-11-04 DIAGNOSIS — N183 Chronic kidney disease, stage 3 (moderate): Secondary | ICD-10-CM | POA: Diagnosis present

## 2017-11-04 DIAGNOSIS — Y929 Unspecified place or not applicable: Secondary | ICD-10-CM

## 2017-11-04 DIAGNOSIS — Z880 Allergy status to penicillin: Secondary | ICD-10-CM

## 2017-11-04 DIAGNOSIS — R269 Unspecified abnormalities of gait and mobility: Secondary | ICD-10-CM | POA: Diagnosis not present

## 2017-11-04 DIAGNOSIS — C9210 Chronic myeloid leukemia, BCR/ABL-positive, not having achieved remission: Secondary | ICD-10-CM | POA: Diagnosis present

## 2017-11-04 DIAGNOSIS — M545 Low back pain: Secondary | ICD-10-CM | POA: Diagnosis not present

## 2017-11-04 DIAGNOSIS — G8929 Other chronic pain: Secondary | ICD-10-CM | POA: Insufficient documentation

## 2017-11-04 DIAGNOSIS — Z856 Personal history of leukemia: Secondary | ICD-10-CM

## 2017-11-04 DIAGNOSIS — S199XXA Unspecified injury of neck, initial encounter: Secondary | ICD-10-CM | POA: Diagnosis not present

## 2017-11-04 DIAGNOSIS — W19XXXA Unspecified fall, initial encounter: Secondary | ICD-10-CM | POA: Diagnosis not present

## 2017-11-04 DIAGNOSIS — D631 Anemia in chronic kidney disease: Secondary | ICD-10-CM | POA: Diagnosis present

## 2017-11-04 DIAGNOSIS — S5012XA Contusion of left forearm, initial encounter: Secondary | ICD-10-CM

## 2017-11-04 DIAGNOSIS — Y939 Activity, unspecified: Secondary | ICD-10-CM | POA: Insufficient documentation

## 2017-11-04 DIAGNOSIS — R262 Difficulty in walking, not elsewhere classified: Secondary | ICD-10-CM | POA: Diagnosis not present

## 2017-11-04 DIAGNOSIS — W010XXA Fall on same level from slipping, tripping and stumbling without subsequent striking against object, initial encounter: Secondary | ICD-10-CM

## 2017-11-04 DIAGNOSIS — Z79899 Other long term (current) drug therapy: Secondary | ICD-10-CM | POA: Insufficient documentation

## 2017-11-04 DIAGNOSIS — S3992XA Unspecified injury of lower back, initial encounter: Secondary | ICD-10-CM | POA: Diagnosis not present

## 2017-11-04 DIAGNOSIS — Z7982 Long term (current) use of aspirin: Secondary | ICD-10-CM

## 2017-11-04 DIAGNOSIS — Y92029 Unspecified place in mobile home as the place of occurrence of the external cause: Secondary | ICD-10-CM

## 2017-11-04 DIAGNOSIS — S32020A Wedge compression fracture of second lumbar vertebra, initial encounter for closed fracture: Secondary | ICD-10-CM | POA: Diagnosis not present

## 2017-11-04 DIAGNOSIS — S22080A Wedge compression fracture of T11-T12 vertebra, initial encounter for closed fracture: Secondary | ICD-10-CM | POA: Diagnosis not present

## 2017-11-04 DIAGNOSIS — S0990XA Unspecified injury of head, initial encounter: Secondary | ICD-10-CM

## 2017-11-04 DIAGNOSIS — Z91040 Latex allergy status: Secondary | ICD-10-CM

## 2017-11-04 DIAGNOSIS — R296 Repeated falls: Secondary | ICD-10-CM | POA: Diagnosis present

## 2017-11-04 DIAGNOSIS — E43 Unspecified severe protein-calorie malnutrition: Secondary | ICD-10-CM | POA: Diagnosis present

## 2017-11-04 DIAGNOSIS — I519 Heart disease, unspecified: Secondary | ICD-10-CM | POA: Diagnosis present

## 2017-11-04 DIAGNOSIS — M069 Rheumatoid arthritis, unspecified: Secondary | ICD-10-CM | POA: Diagnosis present

## 2017-11-04 LAB — CBC WITH DIFFERENTIAL/PLATELET
Basophils Absolute: 0 10*3/uL (ref 0–0.1)
Basophils Relative: 1 %
Eosinophils Absolute: 0 10*3/uL (ref 0–0.7)
Eosinophils Relative: 0 %
HCT: 34.5 % — ABNORMAL LOW (ref 35.0–47.0)
Hemoglobin: 10.4 g/dL — ABNORMAL LOW (ref 12.0–16.0)
Lymphocytes Relative: 55 %
Lymphs Abs: 3.5 10*3/uL (ref 1.0–3.6)
MCH: 19 pg — ABNORMAL LOW (ref 26.0–34.0)
MCHC: 30.2 g/dL — ABNORMAL LOW (ref 32.0–36.0)
MCV: 63.1 fL — ABNORMAL LOW (ref 80.0–100.0)
Monocytes Absolute: 0.9 10*3/uL (ref 0.2–0.9)
Monocytes Relative: 13 %
Neutro Abs: 2 10*3/uL (ref 1.4–6.5)
Neutrophils Relative %: 31 %
Platelets: 96 10*3/uL — ABNORMAL LOW (ref 150–440)
RBC: 5.48 MIL/uL — ABNORMAL HIGH (ref 3.80–5.20)
RDW: 17.4 % — ABNORMAL HIGH (ref 11.5–14.5)
WBC: 6.4 10*3/uL (ref 3.6–11.0)

## 2017-11-04 LAB — BASIC METABOLIC PANEL
Anion gap: 10 (ref 5–15)
BUN: 20 mg/dL (ref 6–20)
CO2: 23 mmol/L (ref 22–32)
Calcium: 9.9 mg/dL (ref 8.9–10.3)
Chloride: 106 mmol/L (ref 101–111)
Creatinine, Ser: 1.01 mg/dL — ABNORMAL HIGH (ref 0.44–1.00)
GFR calc Af Amer: 58 mL/min — ABNORMAL LOW (ref >60)
GFR calc non Af Amer: 50 mL/min — ABNORMAL LOW (ref >60)
Glucose, Bld: 137 mg/dL — ABNORMAL HIGH (ref 65–99)
Potassium: 4.1 mmol/L (ref 3.5–5.1)
Sodium: 139 mmol/L (ref 135–145)

## 2017-11-04 MED ORDER — AMLODIPINE BESYLATE 5 MG PO TABS
ORAL_TABLET | ORAL | Status: AC
  Filled 2017-11-04: qty 1

## 2017-11-04 MED ORDER — AMLODIPINE BESYLATE 5 MG PO TABS
5.0000 mg | ORAL_TABLET | Freq: Once | ORAL | Status: AC
Start: 2017-11-04 — End: 2017-11-04
  Administered 2017-11-04: 5 mg via ORAL

## 2017-11-04 MED ORDER — LISINOPRIL 10 MG PO TABS
10.0000 mg | ORAL_TABLET | Freq: Once | ORAL | Status: DC
  Filled 2017-11-04: qty 1

## 2017-11-04 MED ORDER — LISINOPRIL 10 MG PO TABS: 10.0000 mg | ORAL_TABLET | Freq: Every day | ORAL | 0 refills | Status: DC

## 2017-11-04 MED ORDER — ACETAMINOPHEN 500 MG PO TABS
1000.0000 mg | ORAL_TABLET | Freq: Once | ORAL | Status: AC
Start: 2017-11-04 — End: 2017-11-04
  Administered 2017-11-04: 1000 mg via ORAL
  Filled 2017-11-04: qty 2

## 2017-11-04 MED ORDER — AMLODIPINE BESYLATE 5 MG PO TABS: 5.0000 mg | ORAL_TABLET | Freq: Every day | ORAL | 0 refills | Status: DC

## 2017-11-04 NOTE — ED Triage Notes (Signed)
Pt presents to ED via [facility] with c/o fall. Per EMS pt c/o L arm pain with small hematoma to posterior wrist, pt c/o back pain with hx of back surgeries and is currently receiving injections to her back. Pt states she hit her head, denies LOC, states hx of brain bleed. Pt is alert and oriented at this time.

## 2017-11-04 NOTE — ED Provider Notes (Addendum)
[facility] Emergency Department Provider Note  ____________________________________________  Time seen: Approximately 12:30 PM  I have reviewed the triage vital signs and the nursing notes.   HISTORY  Chief Complaint Fall   HPI Stephanie Harris is a 84 y.o. female with a history of CML currently undergoing treatment at 27, hypertension, RA, CVA who presents for evaluation of mechanical fall. Patient reports that she was trying to get out of the bathroom when she lost her balance and fell. She hit her head on the door. She denies LOC. She is not on blood thinners. Patient is complaining of pain in her left forearm and her neck which are both mild, sharp, and constant since the fall which happened just prior to arrival. She denies feeling dizzy or having any other preceding symptoms leading to the fall and reports that the fall was mechanical in nature. She has a history of chronic back pain and denies any acute exacerbations at this time, denies any other extremity pain, chest pain, abdominal pain, or headache.  Past Medical History:  Diagnosis Date  . CML (chronic myelocytic leukemia) ([place])    Onc at [facility]  . Heart disease   . Hypertension   . Leukemia ([place])   . RA (rheumatoid arthritis) ([place])   . Stroke ([place])   . Thalassemia     Patient Active Problem List   Diagnosis Date Noted  . Closed fracture of proximal end of left humerus with routine healing 08/28/2016  . Acute cystitis 04/28/2016  . Chest pain 03/14/2015  . TIA (transient ischemic attack) 01/29/2015  . Allergic arthritis, hand     Past Surgical History:  Procedure Laterality Date  . ABDOMINAL HYSTERECTOMY    . CAROTID ENDARTERECTOMY Left   . CATARACT EXTRACTION    . CHOLECYSTECTOMY    . KYPHOPLASTY N/A 08/12/2017   Procedure: KYPHOPLASTY;  Surgeon: Hessie Knows, MD;  Location: [facility] ORS;  Service: Orthopedics;  Laterality: N/A;  . PERCUTANEOUS CORONARY STENT INTERVENTION (PCI-S)      . ROTATOR CUFF REPAIR Left   . TOTAL KNEE ARTHROPLASTY Right     Prior to Admission medications   Medication Sig Start Date End Date Taking? Authorizing Provider  ascorbic acid (VITAMIN C) 1000 MG tablet Take 1,000 mg by mouth daily.    [provider]  aspirin 81 MG EC tablet Take 81 mg by mouth daily.     [provider]  carvedilol (COREG) 3.125 MG tablet Take 6.25 mg by mouth 2 (two) times daily.     [provider]  cholecalciferol (VITAMIN D) 1000 UNITS tablet Take 1,000 Units by mouth daily.    [provider]  cyanocobalamin (,VITAMIN B-12,) 1000 MCG/ML injection Inject 1,000 mcg into the muscle every 30 (thirty) days.    [provider]  docusate sodium (COLACE) 100 MG capsule Take 1 capsule (100 mg total) by mouth daily as needed. Patient taking differently: Take 100 mg by mouth daily as needed for mild constipation.  07/28/17 07/28/18  [name], MD  epoetin alfa (EPOGEN,PROCRIT) 37858 UNIT/ML injection Inject 10,000 Units into the skin every 14 (fourteen) days. 04/08/17   [provider]  folic acid (FOLVITE) 1 MG tablet Take 1 mg by mouth daily.    [provider]  levothyroxine (SYNTHROID, LEVOTHROID) 50 MCG tablet Take 50 mcg by mouth daily. On the opposite days from taking 14mcg.    [provider]  ondansetron (ZOFRAN ODT) 4 MG disintegrating tablet Take 1  tablet (4 mg total) by mouth every 8 (eight) hours as needed for nausea or vomiting. 07/28/17   [name], MD  oxyCODONE-acetaminophen (PERCOCET/ROXICET) 5-325 MG tablet Take 1 tablet by mouth every 6 (six) hours as needed for severe pain. 08/12/17   Hillary Bow, MD  polyethylene glycol (MIRALAX / GLYCOLAX) packet Take 17 g by mouth daily as needed for mild constipation. 08/12/17   Hillary Bow, MD  vitamin E 400 UNIT capsule Take 400 Units by mouth daily.    [provider]    Allergies Latex and Penicillins  Family  History  Problem Relation Age of Onset  . Acute myelogenous leukemia Brother   . Stroke Mother   . Heart attack Father     Social History Social History   Tobacco Use  . Smoking status: Never Smoker  . Smokeless tobacco: Never Used  Substance Use Topics  . Alcohol use: No  . Drug use: No    Review of Systems Constitutional: Negative for fever. Eyes: Negative for visual changes. ENT: Negative for facial injury. + neck pain Cardiovascular: Negative for chest injury. Respiratory: Negative for shortness of breath. Negative for chest wall injury. Gastrointestinal: Negative for abdominal pain or injury. Genitourinary: Negative for dysuria. Musculoskeletal: Negative for back injury, negative for arm or leg pain. Skin: + L forearm bruise Neurological: + head injury.  ____________________________________________   PHYSICAL EXAM:  VITAL SIGNS: ED Triage Vitals  Enc Vitals Group     BP 11/04/17 1122 (!) 173/68     Pulse Rate 11/04/17 1122 73     Resp 11/04/17 1122 19     Temp 11/04/17 1122 (!) 97.5 F (36.4 C)     Temp Source 11/04/17 1122 Oral     SpO2 11/04/17 1122 100 %     Weight 11/04/17 1117 110 lb (49.9 kg)     Height 11/04/17 1117 5\' 3"  (1.6 m)     Head Circumference --      Peak Flow --      Pain Score 11/04/17 1128 5     Pain Loc --      Pain Edu? --      Excl. in [place]? --     Constitutional: Alert and oriented. No acute distress. Does not appear intoxicated. HEENT Head: Normocephalic and atraumatic. Face: No facial bony tenderness. Stable midface Ears: No hemotympanum bilaterally. No Battle sign Eyes: No eye injury. PERRL. No raccoon eyes Nose: Nontender. No epistaxis. No rhinorrhea Mouth/Throat: Mucous membranes are moist. No oropharyngeal blood. No dental injury. Airway patent without stridor. Normal voice. Neck: no C-collar in place. No midline c-spine tenderness.  Cardiovascular: Normal rate, regular rhythm. Normal and symmetric distal pulses are  present in all extremities. Pulmonary/Chest: Chest wall is stable and nontender to palpation/compression. Normal respiratory effort. Breath sounds are normal. No crepitus.  Abdominal: Soft, nontender, non distended. Musculoskeletal: Nontender with normal full range of motion in all extremities. No deformities. No thoracic or lumbar midline spinal tenderness. Pelvis is stable. There are two bruises on the L forearm Skin: Skin is warm, dry and intact. No lacerations Psychiatric: Speech and behavior are appropriate. Neurological: Normal speech and language. Moves all extremities to command. No gross focal neurologic deficits are appreciated.  Glascow Coma Score: 4 - Opens eyes on own 6 - Follows simple motor commands 5 - Alert and oriented GCS: 15  ____________________________________________   LABS (all labs ordered are listed, but only abnormal results are displayed)  Labs Reviewed  CBC WITH DIFFERENTIAL/PLATELET -  Abnormal; Notable for the following components:      Result Value   RBC 5.48 (*)    Hemoglobin 10.4 (*)    HCT 34.5 (*)    MCV 63.1 (*)    MCH 19.0 (*)    MCHC 30.2 (*)    RDW 17.4 (*)    Platelets 96 (*)    All other components within normal limits  BASIC METABOLIC PANEL - Abnormal; Notable for the following components:   Glucose, Bld 137 (*)    Creatinine, Ser 1.01 (*)    GFR calc non Af Amer 50 (*)    GFR calc Af Amer 58 (*)    All other components within normal limits   ____________________________________________  EKG  ED ECG REPORT I, Rudene Re, the attending physician, personally viewed and interpreted this ECG.  Normal sinus rhythm, rate of 83, normal intervals,  left axis deviation, no ST elevations or depressions, diffuse T-wave flattening in inferior lateral leads. no significant changes when compared to prior from 9/18 ____________________________________________  RADIOLOGY  I have personally reviewed the images performed during this  visit and I agree with the Radiologist's read.   Interpretation by Radiologist:  Dg Forearm Left  Result Date: 11/04/2017 CLINICAL DATA:  Left forearm pain with a hematoma posterior to the wrist due to a fall today. Initial encounter. EXAM: LEFT FOREARM - 2 VIEW COMPARISON:  None. FINDINGS: There is no evidence of fracture or other focal bone lesions. Soft tissues are unremarkable. IMPRESSION: Negative exam. Electronically Signed   By: Inge Rise M.D.   On: 11/04/2017 11:41   Ct Head Wo Contrast  Result Date: 11/04/2017 CLINICAL DATA:  Pt presents to ED via [facility] with c/o fall. Per EMS pt c/o L arm pain with small hematoma to posterior wrist, pt c/o back pain with hx of back surgeries and is currently receiving injections to her back. Pt states she hit her head, denies LOC, states hx of brain bleed. Pt is alert and oriented at this time. EXAM: CT HEAD WITHOUT CONTRAST CT CERVICAL SPINE WITHOUT CONTRAST TECHNIQUE: Multidetector CT imaging of the head and cervical spine was performed following the standard protocol without intravenous contrast. Multiplanar CT image reconstructions of the cervical spine were also generated. COMPARISON:  05/05/2017 FINDINGS: CT HEAD FINDINGS Brain: No evidence of acute infarction, hemorrhage, hydrocephalus, extra-axial collection or mass lesion/mass effect. There is ventricular sulcal enlargement reflecting mild to moderate generalized atrophy. Patchy white matter hypoattenuation is also noted consistent with moderate to advanced chronic microvascular ischemic change. Hypoattenuation extends to the superior right foot to lobe which may reflect an old infarct. These findings are stable. Vascular: No hyperdense vessel or unexpected calcification. Skull: Normal. Negative for fracture or focal lesion. Sinuses/Orbits: Globes and orbits are unremarkable. Visualized sinuses and mastoid air cells are clear. Other: None. CT CERVICAL SPINE FINDINGS Alignment: Normal. Skull base  and vertebrae: No acute fracture. No primary bone lesion or focal pathologic process. Soft tissues and spinal canal: No prevertebral fluid or swelling. No visible canal hematoma. Disc levels: The cervical discs are well preserved in height. No significant disc bulging and no evidence of a disc herniation. There are facet degenerative changes bilaterally, greatest in the upper to mid cervical spine. Upper chest: No soft tissue masses or adenopathy. There are arterial vascular calcifications and a right carotid stent. Lung apices are clear. Other: None. IMPRESSION: HEAD CT 1. No acute intracranial abnormalities.  No skull fracture. 2. Atrophy and chronic microvascular ischemic change stable from  the prior head CT. CERVICAL CT 1. No fracture or acute finding. Electronically Signed   By: Lajean Manes M.D.   On: 11/04/2017 12:14   Ct Cervical Spine Wo Contrast  Result Date: 11/04/2017 CLINICAL DATA:  Pt presents to ED via [facility] with c/o fall. Per EMS pt c/o L arm pain with small hematoma to posterior wrist, pt c/o back pain with hx of back surgeries and is currently receiving injections to her back. Pt states she hit her head, denies LOC, states hx of brain bleed. Pt is alert and oriented at this time. EXAM: CT HEAD WITHOUT CONTRAST CT CERVICAL SPINE WITHOUT CONTRAST TECHNIQUE: Multidetector CT imaging of the head and cervical spine was performed following the standard protocol without intravenous contrast. Multiplanar CT image reconstructions of the cervical spine were also generated. COMPARISON:  05/05/2017 FINDINGS: CT HEAD FINDINGS Brain: No evidence of acute infarction, hemorrhage, hydrocephalus, extra-axial collection or mass lesion/mass effect. There is ventricular sulcal enlargement reflecting mild to moderate generalized atrophy. Patchy white matter hypoattenuation is also noted consistent with moderate to advanced chronic microvascular ischemic change. Hypoattenuation extends to the superior right foot to  lobe which may reflect an old infarct. These findings are stable. Vascular: No hyperdense vessel or unexpected calcification. Skull: Normal. Negative for fracture or focal lesion. Sinuses/Orbits: Globes and orbits are unremarkable. Visualized sinuses and mastoid air cells are clear. Other: None. CT CERVICAL SPINE FINDINGS Alignment: Normal. Skull base and vertebrae: No acute fracture. No primary bone lesion or focal pathologic process. Soft tissues and spinal canal: No prevertebral fluid or swelling. No visible canal hematoma. Disc levels: The cervical discs are well preserved in height. No significant disc bulging and no evidence of a disc herniation. There are facet degenerative changes bilaterally, greatest in the upper to mid cervical spine. Upper chest: No soft tissue masses or adenopathy. There are arterial vascular calcifications and a right carotid stent. Lung apices are clear. Other: None. IMPRESSION: HEAD CT 1. No acute intracranial abnormalities.  No skull fracture. 2. Atrophy and chronic microvascular ischemic change stable from the prior head CT. CERVICAL CT 1. No fracture or acute finding. Electronically Signed   By: Lajean Manes M.D.   On: 11/04/2017 12:14     ____________________________________________   PROCEDURES  Procedure(s) performed: None Procedures Critical Care performed:  None ____________________________________________   INITIAL IMPRESSION / ASSESSMENT AND PLAN / ED COURSE   84 y.o. female with a history of CML currently undergoing treatment at [facility], hypertension, RA, CVA who presents for evaluation of mechanical fall.CT head and cervical spine negative for acute injury. X-ray of the left forearm is negative for fracture dislocation. Pain is well-controlled after Tylenol. EKG with no evidence of ischemia or arrhythmias. Labs unchanged from patient's baseline. At this time patient is stable for discharge home. Discussed return precautions with her and close follow-up with  her primary care doctor.    _________________________ 1:21 PM on 11/04/2017 -----------------------------------------  Patient's BP is elevated 205/58. Patient tells me that it has been elevated for over a month. Her oncologist told her to see her PCP for BP control but she has not had a chance to make the appointment yet. She is asymptomatic. I will start her on amlodipine and have her see her PCP for further management.     As part of my medical decision making, I reviewed the following data within the [place] notes reviewed and incorporated, Labs reviewed , EKG interpreted , Old EKG reviewed, Radiograph reviewed ,  Notes from prior ED visits and [place] Controlled Substance Database    Pertinent labs & imaging results that were available during my care of the patient were reviewed by me and considered in my medical decision making (see chart for details).    ____________________________________________   FINAL CLINICAL IMPRESSION(S) / ED DIAGNOSES  Final diagnoses:  Fall, initial encounter  Traumatic hematoma of left forearm, initial encounter  Injury of head, initial encounter      NEW MEDICATIONS STARTED DURING THIS VISIT:  ED Discharge Orders    None       Note:  This document was prepared using Dragon voice recognition software and may include unintentional dictation errors.    Alfred Levins, [place], MD 11/04/17 [place], [place], MD 11/04/17 [place], [place], MD 11/04/17 1351

## 2017-11-04 NOTE — Discharge Instructions (Signed)

## 2017-11-04 NOTE — ED Notes (Signed)
This RN spoke to MD regarding patient's continued hypertension. MD to bedside to speak with patient.

## 2017-11-04 NOTE — ED Notes (Signed)
MD aware of patient's BP 193/77. Per MD, pt okay for D/C.

## 2017-11-04 NOTE — ED Notes (Signed)
Pt given phone to call her husband at this time for D/C.

## 2017-11-05 ENCOUNTER — Inpatient Hospital Stay
Admission: EM | Admit: 2017-11-05 | Discharge: 2017-11-09 | DRG: 477 | Disposition: A | Discharge status: Skilled Nursing Facility | Payer: Medicare Other | Attending: Family Medicine | Admitting: Family Medicine

## 2017-11-05 ENCOUNTER — Other Ambulatory Visit: Payer: Self-pay

## 2017-11-05 ENCOUNTER — Emergency Department: Payer: Medicare Other

## 2017-11-05 ENCOUNTER — Encounter: Payer: Self-pay | Admitting: Emergency Medicine

## 2017-11-05 ENCOUNTER — Telehealth: Payer: Self-pay

## 2017-11-05 DIAGNOSIS — S32020A Wedge compression fracture of second lumbar vertebra, initial encounter for closed fracture: Secondary | ICD-10-CM

## 2017-11-05 DIAGNOSIS — R296 Repeated falls: Secondary | ICD-10-CM | POA: Diagnosis present

## 2017-11-05 DIAGNOSIS — I10 Essential (primary) hypertension: Secondary | ICD-10-CM | POA: Diagnosis not present

## 2017-11-05 DIAGNOSIS — Z91040 Latex allergy status: Secondary | ICD-10-CM | POA: Diagnosis not present

## 2017-11-05 DIAGNOSIS — E43 Unspecified severe protein-calorie malnutrition: Secondary | ICD-10-CM | POA: Diagnosis present

## 2017-11-05 DIAGNOSIS — C9110 Chronic lymphocytic leukemia of B-cell type not having achieved remission: Secondary | ICD-10-CM | POA: Diagnosis not present

## 2017-11-05 DIAGNOSIS — S32020S Wedge compression fracture of second lumbar vertebra, sequela: Secondary | ICD-10-CM | POA: Diagnosis not present

## 2017-11-05 DIAGNOSIS — W010XXA Fall on same level from slipping, tripping and stumbling without subsequent striking against object, initial encounter: Secondary | ICD-10-CM | POA: Diagnosis present

## 2017-11-05 DIAGNOSIS — M069 Rheumatoid arthritis, unspecified: Secondary | ICD-10-CM | POA: Diagnosis not present

## 2017-11-05 DIAGNOSIS — Z7401 Bed confinement status: Secondary | ICD-10-CM | POA: Diagnosis not present

## 2017-11-05 DIAGNOSIS — Z955 Presence of coronary angioplasty implant and graft: Secondary | ICD-10-CM | POA: Diagnosis not present

## 2017-11-05 DIAGNOSIS — M545 Low back pain: Secondary | ICD-10-CM | POA: Diagnosis not present

## 2017-11-05 DIAGNOSIS — Z9181 History of falling: Secondary | ICD-10-CM | POA: Diagnosis not present

## 2017-11-05 DIAGNOSIS — R269 Unspecified abnormalities of gait and mobility: Secondary | ICD-10-CM | POA: Diagnosis not present

## 2017-11-05 DIAGNOSIS — R262 Difficulty in walking, not elsewhere classified: Secondary | ICD-10-CM | POA: Diagnosis not present

## 2017-11-05 DIAGNOSIS — N183 Chronic kidney disease, stage 3 (moderate): Secondary | ICD-10-CM | POA: Diagnosis not present

## 2017-11-05 DIAGNOSIS — Z8673 Personal history of transient ischemic attack (TIA), and cerebral infarction without residual deficits: Secondary | ICD-10-CM | POA: Diagnosis not present

## 2017-11-05 DIAGNOSIS — Z7982 Long term (current) use of aspirin: Secondary | ICD-10-CM | POA: Diagnosis not present

## 2017-11-05 DIAGNOSIS — Z681 Body mass index (BMI) 19 or less, adult: Secondary | ICD-10-CM | POA: Diagnosis not present

## 2017-11-05 DIAGNOSIS — M6281 Muscle weakness (generalized): Secondary | ICD-10-CM | POA: Diagnosis not present

## 2017-11-05 DIAGNOSIS — S3992XA Unspecified injury of lower back, initial encounter: Secondary | ICD-10-CM | POA: Diagnosis not present

## 2017-11-05 DIAGNOSIS — Y92029 Unspecified place in mobile home as the place of occurrence of the external cause: Secondary | ICD-10-CM | POA: Diagnosis not present

## 2017-11-05 DIAGNOSIS — Z419 Encounter for procedure for purposes other than remedying health state, unspecified: Secondary | ICD-10-CM

## 2017-11-05 DIAGNOSIS — I1310 Hypertensive heart and chronic kidney disease without heart failure, with stage 1 through stage 4 chronic kidney disease, or unspecified chronic kidney disease: Secondary | ICD-10-CM | POA: Diagnosis present

## 2017-11-05 DIAGNOSIS — Z79899 Other long term (current) drug therapy: Secondary | ICD-10-CM | POA: Diagnosis not present

## 2017-11-05 DIAGNOSIS — K5900 Constipation, unspecified: Secondary | ICD-10-CM | POA: Diagnosis present

## 2017-11-05 DIAGNOSIS — M8008XD Age-related osteoporosis with current pathological fracture, vertebra(e), subsequent encounter for fracture with routine healing: Secondary | ICD-10-CM | POA: Diagnosis not present

## 2017-11-05 DIAGNOSIS — Z96651 Presence of right artificial knee joint: Secondary | ICD-10-CM | POA: Diagnosis present

## 2017-11-05 DIAGNOSIS — I129 Hypertensive chronic kidney disease with stage 1 through stage 4 chronic kidney disease, or unspecified chronic kidney disease: Secondary | ICD-10-CM | POA: Diagnosis not present

## 2017-11-05 DIAGNOSIS — D569 Thalassemia, unspecified: Secondary | ICD-10-CM | POA: Diagnosis present

## 2017-11-05 DIAGNOSIS — I519 Heart disease, unspecified: Secondary | ICD-10-CM | POA: Diagnosis present

## 2017-11-05 DIAGNOSIS — M4856XA Collapsed vertebra, not elsewhere classified, lumbar region, initial encounter for fracture: Secondary | ICD-10-CM | POA: Diagnosis not present

## 2017-11-05 DIAGNOSIS — Z7984 Long term (current) use of oral hypoglycemic drugs: Secondary | ICD-10-CM | POA: Diagnosis not present

## 2017-11-05 DIAGNOSIS — C9210 Chronic myeloid leukemia, BCR/ABL-positive, not having achieved remission: Secondary | ICD-10-CM | POA: Diagnosis not present

## 2017-11-05 DIAGNOSIS — D631 Anemia in chronic kidney disease: Secondary | ICD-10-CM | POA: Diagnosis present

## 2017-11-05 DIAGNOSIS — M549 Dorsalgia, unspecified: Secondary | ICD-10-CM | POA: Diagnosis not present

## 2017-11-05 DIAGNOSIS — E1122 Type 2 diabetes mellitus with diabetic chronic kidney disease: Secondary | ICD-10-CM | POA: Diagnosis not present

## 2017-11-05 DIAGNOSIS — S22080A Wedge compression fracture of T11-T12 vertebra, initial encounter for closed fracture: Secondary | ICD-10-CM | POA: Diagnosis not present

## 2017-11-05 DIAGNOSIS — E039 Hypothyroidism, unspecified: Secondary | ICD-10-CM | POA: Diagnosis not present

## 2017-11-05 DIAGNOSIS — Z880 Allergy status to penicillin: Secondary | ICD-10-CM | POA: Diagnosis not present

## 2017-11-05 DIAGNOSIS — S32000A Wedge compression fracture of unspecified lumbar vertebra, initial encounter for closed fracture: Secondary | ICD-10-CM | POA: Diagnosis present

## 2017-11-05 DIAGNOSIS — I779 Disorder of arteries and arterioles, unspecified: Secondary | ICD-10-CM | POA: Diagnosis not present

## 2017-11-05 DIAGNOSIS — M4850XA Collapsed vertebra, not elsewhere classified, site unspecified, initial encounter for fracture: Secondary | ICD-10-CM | POA: Diagnosis not present

## 2017-11-05 DIAGNOSIS — D563 Thalassemia minor: Secondary | ICD-10-CM | POA: Diagnosis not present

## 2017-11-05 DIAGNOSIS — R402413 Glasgow coma scale score 13-15, at hospital admission: Secondary | ICD-10-CM | POA: Diagnosis present

## 2017-11-05 LAB — BASIC METABOLIC PANEL
Anion gap: 10 (ref 5–15)
BUN: 21 mg/dL — ABNORMAL HIGH (ref 6–20)
CO2: 23 mmol/L (ref 22–32)
Calcium: 9.9 mg/dL (ref 8.9–10.3)
Chloride: 105 mmol/L (ref 101–111)
Creatinine, Ser: 0.94 mg/dL (ref 0.44–1.00)
GFR calc Af Amer: >60 mL/min (ref >60)
GFR calc non Af Amer: 54 mL/min — ABNORMAL LOW (ref >60)
Glucose, Bld: 113 mg/dL — ABNORMAL HIGH (ref 65–99)
Potassium: 4.9 mmol/L (ref 3.5–5.1)
Sodium: 138 mmol/L (ref 135–145)

## 2017-11-05 LAB — CBC WITH DIFFERENTIAL/PLATELET
Basophils Absolute: 0 10*3/uL (ref 0–0.1)
Basophils Relative: 0 %
Eosinophils Absolute: 0 10*3/uL (ref 0–0.7)
Eosinophils Relative: 0 %
HCT: 36.5 % (ref 35.0–47.0)
Hemoglobin: 11.2 g/dL — ABNORMAL LOW (ref 12.0–16.0)
Lymphocytes Relative: 47 %
Lymphs Abs: 3.5 10*3/uL (ref 1.0–3.6)
MCH: 19.3 pg — ABNORMAL LOW (ref 26.0–34.0)
MCHC: 30.7 g/dL — ABNORMAL LOW (ref 32.0–36.0)
MCV: 63 fL — ABNORMAL LOW (ref 80.0–100.0)
Monocytes Absolute: 1.2 10*3/uL — ABNORMAL HIGH (ref 0.2–0.9)
Monocytes Relative: 15 %
Neutro Abs: 3 10*3/uL (ref 1.4–6.5)
Neutrophils Relative %: 38 %
Platelets: 99 10*3/uL — ABNORMAL LOW (ref 150–440)
RBC: 5.79 MIL/uL — ABNORMAL HIGH (ref 3.80–5.20)
RDW: 17.5 % — ABNORMAL HIGH (ref 11.5–14.5)
WBC: 7.7 10*3/uL (ref 3.6–11.0)

## 2017-11-05 NOTE — ED Notes (Signed)
First RN note:  Patient here via EMS from home c/o weakness, and informed EMS that she "couldn't sleep last night and she was scared to get out of bed".  Patient in NAD.

## 2017-11-05 NOTE — ED Triage Notes (Signed)
Pt to ED via POV. Pt states that she was seen in our ED yesterday for a fall and was sent home. Pt states that today her pain was so bad that she was unable to get out of the bed. Pt states that she called her PCP who advised her to come back to the ED for evaluation. Pt is in NAD at this time.

## 2017-11-05 NOTE — ED Provider Notes (Addendum)
[facility] Emergency Department Provider Note  ___________________________________________   First MD Initiated Contact with Patient 11/05/17 2101     (approximate)  I have reviewed the triage vital signs and the nursing notes.   HISTORY  Chief Complaint Back Pain   HPI Stephanie Harris is a 84 y.o. female with a history of CML as well as hypertension and rheumatoid arthritis who is presenting with low back pain.  She said that she lost her balance yesterday and fell on her buttocks in the bathroom.  She says that she was evaluated at the hospital yesterday and discharged home.  However, she says that due to low back pain today that is worsening she was not able to get out of bed and says that her legs are shaky when she stands up and she is unable to ambulate and her husband is unable to help her ambulate and walk enough to do her activities of daily living.  She denies any bowel or bladder incontinence.  Denies any numbness to the bilateral lower extremities.  Patient says that she put herself in a diaper and has not been able to get out of bed all day.  No radiation of the pain down the legs.  Past Medical History:  Diagnosis Date  . CML (chronic myelocytic leukemia) ([place])    Onc at [facility]  . Heart disease   . Hypertension   . Leukemia ([place])   . RA (rheumatoid arthritis) ([place])   . Stroke ([place])   . Thalassemia     Patient Active Problem List   Diagnosis Date Noted  . Closed fracture of proximal end of left humerus with routine healing 08/28/2016  . Acute cystitis 04/28/2016  . Chest pain 03/14/2015  . TIA (transient ischemic attack) 01/29/2015  . Allergic arthritis, hand     Past Surgical History:  Procedure Laterality Date  . ABDOMINAL HYSTERECTOMY    . CAROTID ENDARTERECTOMY Left   . CATARACT EXTRACTION    . CHOLECYSTECTOMY    . KYPHOPLASTY N/A 08/12/2017   Procedure: KYPHOPLASTY;  Surgeon: Hessie Knows, MD;  Location: [facility] ORS;  Service:  Orthopedics;  Laterality: N/A;  . PERCUTANEOUS CORONARY STENT INTERVENTION (PCI-S)    . ROTATOR CUFF REPAIR Left   . TOTAL KNEE ARTHROPLASTY Right     Prior to Admission medications   Medication Sig Start Date End Date Taking? Authorizing Provider  amLODipine (NORVASC) 5 MG tablet Take 1 tablet (5 mg total) by mouth daily. 11/04/17 11/04/18  Rudene Re, MD  ascorbic acid (VITAMIN C) 1000 MG tablet Take 1,000 mg by mouth daily.    [provider]  aspirin 81 MG EC tablet Take 81 mg by mouth daily.     [provider]  carvedilol (COREG) 3.125 MG tablet Take 6.25 mg by mouth 2 (two) times daily.     [provider]  cholecalciferol (VITAMIN D) 1000 UNITS tablet Take 1,000 Units by mouth daily.    [provider]  cyanocobalamin (,VITAMIN B-12,) 1000 MCG/ML injection Inject 1,000 mcg into the muscle every 30 (thirty) days.    [provider]  docusate sodium (COLACE) 100 MG capsule Take 1 capsule (100 mg total) by mouth daily as needed. Patient taking differently: Take 100 mg by mouth daily as needed for mild constipation.  07/28/17 07/28/18  [name], MD  epoetin alfa (EPOGEN,PROCRIT) 69629 UNIT/ML injection Inject 10,000 Units into the skin every 14 (fourteen) days. 04/08/17   [provider]  folic acid (FOLVITE) 1 MG tablet Take 1 mg by mouth daily.    [provider]  levothyroxine (SYNTHROID, LEVOTHROID) 50 MCG tablet Take 50 mcg by mouth daily. On the opposite days from taking 61mcg.    [provider]  ondansetron (ZOFRAN ODT) 4 MG disintegrating tablet Take 1 tablet (4 mg total) by mouth every 8 (eight) hours as needed for nausea or vomiting. 07/28/17   [name], MD  oxyCODONE-acetaminophen (PERCOCET/ROXICET) 5-325 MG tablet Take 1 tablet by mouth every 6 (six) hours as needed for severe pain. 08/12/17   Hillary Bow, MD  polyethylene glycol (MIRALAX / GLYCOLAX) packet Take 17 g by mouth  daily as needed for mild constipation. 08/12/17   Hillary Bow, MD  vitamin E 400 UNIT capsule Take 400 Units by mouth daily.    [provider]    Allergies Latex and Penicillins  Family History  Problem Relation Age of Onset  . Acute myelogenous leukemia Brother   . Stroke Mother   . Heart attack Father     Social History Social History   Tobacco Use  . Smoking status: Never Smoker  . Smokeless tobacco: Never Used  Substance Use Topics  . Alcohol use: No  . Drug use: No    Review of Systems  Constitutional: No fever/chills Eyes: No visual changes. ENT: No sore throat. Cardiovascular: Denies chest pain. Respiratory: Denies shortness of breath. Gastrointestinal: No abdominal pain.  No nausea, no vomiting.  No diarrhea.  No constipation. Genitourinary: Negative for dysuria. Musculoskeletal: As above Skin: Negative for rash. Neurological: Negative for headaches, focal weakness or numbness.   ____________________________________________   PHYSICAL EXAM:  VITAL SIGNS: ED Triage Vitals [11/05/17 1706]  Enc Vitals Group     BP (!) 156/68     Pulse Rate 95     Resp 16     Temp 98 F (36.7 C)     Temp Source Oral     SpO2 100 %     Weight 110 lb (49.9 kg)     Height      Head Circumference      Peak Flow      Pain Score 8     Pain Loc      Pain Edu?      Excl. in [place]?     Constitutional: Alert and oriented. Well appearing and in no acute distress. Eyes: Conjunctivae are normal.  Head: Atraumatic. Nose: No congestion/rhinnorhea. Mouth/Throat: Mucous membranes are moist.  Neck: No stridor.   Cardiovascular: Normal rate, regular rhythm. Grossly normal heart sounds.   Respiratory: Normal respiratory effort.  No retractions. Lungs CTAB. Gastrointestinal: Soft and nontender. No distention. No CVA tenderness. Musculoskeletal: Moderate tenderness to palpation across the lower lumbar region without any midline step-off or deformity.  No ecchymosis  present. Strong 4 out of 5 strength to the bilateral lower extremities.  No saddle anesthesia. Neurologic:  Normal speech and language.  Skin:  Skin is warm, dry and intact. No rash noted. Psychiatric: Mood and affect are normal. Speech and behavior are normal.  ____________________________________________   LABS (all labs ordered are listed, but only abnormal results are displayed)  Labs Reviewed  CBC WITH DIFFERENTIAL/PLATELET  BASIC METABOLIC PANEL   ____________________________________________  EKG   ____________________________________________  RADIOLOGY  Lumbar plain film without acute fracture. ____________________________________________   PROCEDURES  Procedure(s) performed:   Procedures  Critical Care performed:   ____________________________________________   INITIAL IMPRESSION / ASSESSMENT AND PLAN / ED COURSE  Pertinent labs & imaging results that were available during my care of the patient were reviewed by me and considered in my medical decision making (see chart for details).  DDX: Vertebral compression fracture, contusion, lower extremely weakness, cauda equina syndrome, cord compression, fall, pain, weakness, deconditioning As part of my medical decision making, I reviewed the following data within the [facility] Notes from prior ED visits  Patient refusing pain meds at this time.  We will MRI of the patient's lumbar spine to make sure there is no acute, emergent spinous issue.  However, a social work as well as physical therapy consult were ordered.  I informed the patient that she would likely be in the emergency department overnight pending these evaluations and may need home health versus rehab.  She is understanding of this plan willing to comply. ____________________________________________   FINAL CLINICAL IMPRESSION(S) / ED DIAGNOSES  Low back pain.  Ambulatory dysfunction.    NEW MEDICATIONS STARTED DURING THIS  VISIT:  New Prescriptions   No medications on file     Note:  This document was prepared using Dragon voice recognition software and may include unintentional dictation errors.     Orbie Pyo, MD 11/05/17 2231    Orbie Pyo, MD 11/05/17 2232  Patient found to have L2 fracture with compression deformity on MRI.  Patient will be admitted to the hospital.  Signed out to Dr. Prudence Davidson.  Diagnosis and plan explained to both the patient and her husband and they are understanding and willing to comply.    Orbie Pyo, MD 11/05/17 [phone]

## 2017-11-05 NOTE — Telephone Encounter (Signed)
Pt called and left vm stating that she fell last night and called 911 and asked for [place] to call her.  I tried to call pt back but line was busy.  dbs

## 2017-11-05 NOTE — Telephone Encounter (Signed)
Pt called that she fell last night and she called 911 and evalauated and told her to follow up with pcp but she still hurting and not eat since this morning she feels dehydrated  I advised her go to er or call 911 and as per heather advised pt go to er or call 911

## 2017-11-05 NOTE — H&P (Signed)
[facility] [place] NAME: Stephanie Harris    MR#:  [id number]  DATE OF BIRTH:  08/06/1931  DATE OF ADMISSION:  11/05/2017  PRIMARY CARE PHYSICIAN: [name], MD   REQUESTING/REFERRING PHYSICIAN:   CHIEF COMPLAINT:   Chief Complaint  Patient presents with  . Back Pain    HISTORY OF PRESENT ILLNESS: Stephanie Harris  is a 84 y.o. female with a known history of chronic myelocytic leukemia, hypertension, osteoarthritis and chronic hemolytic anemia. Patient was brought to emergency room for intractable lower back pain and generalized weakness status post mechanical fall, at home yesterday.  She lost her balance while walking without a walker in her trailer home.  Patient is currently unable to ambulate on her own due to lower back pain and generalized weakness.  Her husband is older than her and has his old medical problems.  He is not strong enough to help her ambulate and he is worried that they could both fall.  Patient denies any focal weakness; she can move all her extremities; she denies pain or numbness in her lower extremities.  MRI of the lumbar spine, done in the emergency room, reviewed by myself, shows interval L2 superior endplate fracture with 47% central loss of vertebral body height and mild edema indicating recent injury.  Other old fractures at thoracic and lumbar spine level are noted to be stable. Patient is admitted for further evaluation and pain control.  PAST MEDICAL HISTORY:   Past Medical History:  Diagnosis Date  . CML (chronic myelocytic leukemia) ([place])    Onc at [facility]  . Heart disease   . Hypertension   . Leukemia ([place])   . RA (rheumatoid arthritis) ([place])   . Stroke ([place])   . Thalassemia     PAST SURGICAL HISTORY:  Past Surgical History:  Procedure Laterality Date  . ABDOMINAL HYSTERECTOMY    . CAROTID ENDARTERECTOMY Left   . CATARACT EXTRACTION    . CHOLECYSTECTOMY    . KYPHOPLASTY N/A 08/12/2017    Procedure: KYPHOPLASTY;  Surgeon: Hessie Knows, MD;  Location: [facility] ORS;  Service: Orthopedics;  Laterality: N/A;  . PERCUTANEOUS CORONARY STENT INTERVENTION (PCI-S)    . ROTATOR CUFF REPAIR Left   . TOTAL KNEE ARTHROPLASTY Right     SOCIAL HISTORY:  Social History   Tobacco Use  . Smoking status: Never Smoker  . Smokeless tobacco: Never Used  Substance Use Topics  . Alcohol use: No    FAMILY HISTORY:  Family History  Problem Relation Age of Onset  . Acute myelogenous leukemia Brother   . Stroke Mother   . Heart attack Father     DRUG ALLERGIES:  Allergies  Allergen Reactions  . Latex Rash  . Penicillins Other (See Comments)    Has patient had a PCN reaction causing immediate rash, facial/tongue/throat swelling, SOB or lightheadedness with hypotension: Unknown Has patient had a PCN reaction causing severe rash involving mucus membranes or skin necrosis: Unknown Has patient had a PCN reaction that required hospitalization: Unknown Has patient had a PCN reaction occurring within the last 10 years: Unknown If all of the above answers are "NO", then may proceed with Cephalosporin use.     REVIEW OF SYSTEMS:   CONSTITUTIONAL: No fever, but positive for fatigue and generalized weakness.  EYES: No vision change.  EARS, NOSE, AND THROAT: No tinnitus or ear pain.  RESPIRATORY: No cough, shortness of breath, wheezing or hemoptysis.  CARDIOVASCULAR: No chest  pain, orthopnea, edema.  GASTROINTESTINAL: No nausea, vomiting, diarrhea or abdominal pain.  GENITOURINARY: No dysuria, hematuria.  ENDOCRINE: No polyuria, nocturia,  HEMATOLOGY: Positive history of CML.  No bleeding. SKIN: No rash or lesion. MUSCULOSKELETAL: Positive for lower back pain.   NEUROLOGIC: No focal weakness.  PSYCHIATRY: No anxiety or depression.   MEDICATIONS AT HOME:  Prior to Admission medications   Medication Sig Start Date End Date Taking? Authorizing Provider  amLODipine (NORVASC) 5 MG tablet Take  1 tablet (5 mg total) by mouth daily. 11/04/17 11/04/18 Yes Veronese, [place], MD  ascorbic acid (VITAMIN C) 1000 MG tablet Take 1,000 mg by mouth daily.   Yes [provider]  aspirin 81 MG EC tablet Take 81 mg by mouth daily.    Yes [provider]  carvedilol (COREG) 3.125 MG tablet Take 6.25 mg by mouth 2 (two) times daily.    Yes [provider]  cholecalciferol (VITAMIN D) 1000 UNITS tablet Take 1,000 Units by mouth daily.   Yes [provider]  cyanocobalamin (,VITAMIN B-12,) 1000 MCG/ML injection Inject 1,000 mcg into the muscle every 30 (thirty) days.   Yes [provider]  docusate sodium (COLACE) 100 MG capsule Take 1 capsule (100 mg total) by mouth daily as needed. Patient taking differently: Take 100 mg by mouth daily as needed for mild constipation.  07/28/17 07/28/18 Yes [name], MD  epoetin alfa (EPOGEN,PROCRIT) 90300 UNIT/ML injection Inject 10,000 Units into the skin every 14 (fourteen) days. 04/08/17  Yes [provider]  folic acid (FOLVITE) 1 MG tablet Take 1 mg by mouth daily.   Yes [provider]  levothyroxine (SYNTHROID, LEVOTHROID) 50 MCG tablet Take 50 mcg by mouth daily. On the opposite days from taking 30mcg.   Yes [provider]  ondansetron (ZOFRAN ODT) 4 MG disintegrating tablet Take 1 tablet (4 mg total) by mouth every 8 (eight) hours as needed for nausea or vomiting. 07/28/17  Yes [name], MD  oxyCODONE-acetaminophen (PERCOCET/ROXICET) 5-325 MG tablet Take 1 tablet by mouth every 6 (six) hours as needed for severe pain. 08/12/17  Yes Sudini, Alveta Heimlich, MD  polyethylene glycol (MIRALAX / GLYCOLAX) packet Take 17 g by mouth daily as needed for mild constipation. 08/12/17  Yes SudiniAlveta Heimlich, MD  vitamin E 400 UNIT capsule Take 400 Units by mouth daily.   Yes [provider]      PHYSICAL EXAMINATION:   VITAL SIGNS: Blood pressure (!) 162/89, pulse 85, temperature  97.8 F (36.6 C), temperature source Oral, resp. rate 16, weight 49.9 kg (110 lb), SpO2 100 %.  GENERAL:  84 y.o.-year-old patient lying in the bed, in moderate distress, secondary to lower back pain.  EYES: Pupils equal, round, reactive to light and accommodation. No scleral icterus. Extraocular muscles intact.  HEENT: Head atraumatic, normocephalic. Oropharynx and nasopharynx clear.  NECK:  Supple, no jugular venous distention. No thyroid enlargement, no tenderness.  LUNGS: Normal breath sounds bilaterally, no wheezing, rales,rhonchi or crepitation. No use of accessory muscles of respiration.  CARDIOVASCULAR: S1, S2 normal. No S3/S4.  ABDOMEN: Soft, nontender, nondistended. Bowel sounds present. No organomegaly or mass.  EXTREMITIES: No pedal edema, cyanosis, or clubbing.  NEUROLOGIC: No focal weakness.  Gait not checked, as patient is not able to ambulate due to lower back pain.  PSYCHIATRIC: The patient is alert and oriented x 3.  SKIN: No obvious rash, lesion, or ulcer.  MUSCULOSKELETAL: There is tenderness and reduced range of motion at thoracic and lumbar  spine levels.  LABORATORY PANEL:   CBC Recent Labs  Lab 11/04/17 1122 11/05/17 2245  WBC 6.4 7.7  HGB 10.4* 11.2*  HCT 34.5* 36.5  PLT 96* 99*  MCV 63.1* 63.0*  MCH 19.0* 19.3*  MCHC 30.2* 30.7*  RDW 17.4* 17.5*  LYMPHSABS 3.5 3.5  MONOABS 0.9 1.2*  EOSABS 0.0 0.0  BASOSABS 0.0 0.0   ------------------------------------------------------------------------------------------------------------------  Chemistries  Recent Labs  Lab 11/04/17 1122 11/05/17 2245  NA 139 138  K 4.1 4.9  CL 106 105  CO2 23 23  GLUCOSE 137* 113*  BUN 20 21*  CREATININE 1.01* 0.94  CALCIUM 9.9 9.9   ------------------------------------------------------------------------------------------------------------------ estimated creatinine clearance is 35.1 mL/min (by C-G formula based on SCr of 0.94  mg/dL). ------------------------------------------------------------------------------------------------------------------ No results for input(s): TSH, T4TOTAL, T3FREE, THYROIDAB in the last 72 hours.  Invalid input(s): FREET3   Coagulation profile No results for input(s): INR, PROTIME in the last 168 hours. ------------------------------------------------------------------------------------------------------------------- No results for input(s): DDIMER in the last 72 hours. -------------------------------------------------------------------------------------------------------------------  Cardiac Enzymes No results for input(s): CKMB, TROPONINI, MYOGLOBIN in the last 168 hours.  Invalid input(s): CK ------------------------------------------------------------------------------------------------------------------ Invalid input(s): POCBNP  ---------------------------------------------------------------------------------------------------------------  Urinalysis    Component Value Date/Time   COLORURINE YELLOW (A) 07/28/2017 1726   APPEARANCEUR CLEAR (A) 07/28/2017 1726   APPEARANCEUR Cloudy 11/23/2013 1958   LABSPEC 1.008 07/28/2017 1726   LABSPEC 1.016 11/23/2013 1958   PHURINE 5.0 07/28/2017 1726   GLUCOSEU NEGATIVE 07/28/2017 1726   GLUCOSEU Negative 11/23/2013 1958   HGBUR SMALL (A) 07/28/2017 1726   BILIRUBINUR NEGATIVE 07/28/2017 1726   BILIRUBINUR Negative 11/23/2013 1958   KETONESUR NEGATIVE 07/28/2017 1726   PROTEINUR NEGATIVE 07/28/2017 1726   NITRITE NEGATIVE 07/28/2017 1726   LEUKOCYTESUR NEGATIVE 07/28/2017 1726   LEUKOCYTESUR 3+ 11/23/2013 1958     RADIOLOGY: Dg Thoracic Spine 2 View  Result Date: 11/05/2017 CLINICAL DATA:  84 year old female status post fall yesterday. Previous thoracic and lumbar compression fractures. EXAM: THORACIC SPINE 2 VIEWS COMPARISON:  Cervical spine CT 11/04/2017. Images from vertebral augmentation 08/12/2017. Thoracic and  lumbar MRI 08/11/2017. FINDINGS: Eleven rib-bearing vertebra. Absent ribs at T12. Chronic T11 and T12 compression fractures, the latter has been previously augmented. Visible lumbar levels remain intact. No new thoracic spine compression fracture identified. Mildly exaggerated thoracic scoliosis. Cervicothoracic junction alignment is within normal limits. Posterior ribs appear intact. Thoracic visceral contours appear stable. IMPRESSION: 1. This numbering system differs from that on the 08/11/2017 MRIs. 2. Chronic compression fractures at T11 and T12 (absent ribs at T12). Previous T12 augmentation. 3.  No acute osseous abnormality identified. Electronically Signed   By: Genevie Ann M.D.   On: 11/05/2017 18:15   Dg Lumbar Spine Complete  Result Date: 11/05/2017 CLINICAL DATA:  Fall yesterday.  Low back pain. EXAM: LUMBAR SPINE - COMPLETE 4+ VIEW COMPARISON:  07/31/2017 CT abdomen/pelvis FINDINGS: This report assumes 5 non rib-bearing lumbar vertebrae. Severe T12 chronic stable vertebral compression fracture. Severe chronic L1 vertebral compression fracture status post vertebroplasty. Remaining lumbar vertebral body heights are preserved, with no evidence of acute fracture. Moderate multilevel lumbar spondylosis, most prominent at L2-3. No acute spondylolisthesis. Stable mild 2 mm retrolisthesis at L3-4 and mild 3 mm anterolisthesis at L4-5. Mild bilateral lower lumbar facet arthropathy. No aggressive appearing focal osseous lesions. Cholecystectomy clip is seen in the right upper quadrant of the abdomen. Abdominal aortic atherosclerosis. IMPRESSION: 1. No acute lumbar spine fracture or malalignment. 2. Stable chronic T12 and L1 vertebral compression fractures with vertebroplasty at L1.  3. Moderate multilevel lumbar degenerative disc disease, stable. 4. Lower lumbar facet arthropathy. 5. Mild multilevel lumbar spondylolisthesis, stable. Electronically Signed   By: Ilona Sorrel M.D.   On: 11/05/2017 22:18   Dg  Forearm Left  Result Date: 11/04/2017 CLINICAL DATA:  Left forearm pain with a hematoma posterior to the wrist due to a fall today. Initial encounter. EXAM: LEFT FOREARM - 2 VIEW COMPARISON:  None. FINDINGS: There is no evidence of fracture or other focal bone lesions. Soft tissues are unremarkable. IMPRESSION: Negative exam. Electronically Signed   By: Inge Rise M.D.   On: 11/04/2017 11:41   Ct Head Wo Contrast  Result Date: 11/04/2017 CLINICAL DATA:  Pt presents to ED via [facility] with c/o fall. Per EMS pt c/o L arm pain with small hematoma to posterior wrist, pt c/o back pain with hx of back surgeries and is currently receiving injections to her back. Pt states she hit her head, denies LOC, states hx of brain bleed. Pt is alert and oriented at this time. EXAM: CT HEAD WITHOUT CONTRAST CT CERVICAL SPINE WITHOUT CONTRAST TECHNIQUE: Multidetector CT imaging of the head and cervical spine was performed following the standard protocol without intravenous contrast. Multiplanar CT image reconstructions of the cervical spine were also generated. COMPARISON:  05/05/2017 FINDINGS: CT HEAD FINDINGS Brain: No evidence of acute infarction, hemorrhage, hydrocephalus, extra-axial collection or mass lesion/mass effect. There is ventricular sulcal enlargement reflecting mild to moderate generalized atrophy. Patchy white matter hypoattenuation is also noted consistent with moderate to advanced chronic microvascular ischemic change. Hypoattenuation extends to the superior right foot to lobe which may reflect an old infarct. These findings are stable. Vascular: No hyperdense vessel or unexpected calcification. Skull: Normal. Negative for fracture or focal lesion. Sinuses/Orbits: Globes and orbits are unremarkable. Visualized sinuses and mastoid air cells are clear. Other: None. CT CERVICAL SPINE FINDINGS Alignment: Normal. Skull base and vertebrae: No acute fracture. No primary bone lesion or focal pathologic process. Soft  tissues and spinal canal: No prevertebral fluid or swelling. No visible canal hematoma. Disc levels: The cervical discs are well preserved in height. No significant disc bulging and no evidence of a disc herniation. There are facet degenerative changes bilaterally, greatest in the upper to mid cervical spine. Upper chest: No soft tissue masses or adenopathy. There are arterial vascular calcifications and a right carotid stent. Lung apices are clear. Other: None. IMPRESSION: HEAD CT 1. No acute intracranial abnormalities.  No skull fracture. 2. Atrophy and chronic microvascular ischemic change stable from the prior head CT. CERVICAL CT 1. No fracture or acute finding. Electronically Signed   By: Lajean Manes M.D.   On: 11/04/2017 12:14   Ct Cervical Spine Wo Contrast  Result Date: 11/04/2017 CLINICAL DATA:  Pt presents to ED via [facility] with c/o fall. Per EMS pt c/o L arm pain with small hematoma to posterior wrist, pt c/o back pain with hx of back surgeries and is currently receiving injections to her back. Pt states she hit her head, denies LOC, states hx of brain bleed. Pt is alert and oriented at this time. EXAM: CT HEAD WITHOUT CONTRAST CT CERVICAL SPINE WITHOUT CONTRAST TECHNIQUE: Multidetector CT imaging of the head and cervical spine was performed following the standard protocol without intravenous contrast. Multiplanar CT image reconstructions of the cervical spine were also generated. COMPARISON:  05/05/2017 FINDINGS: CT HEAD FINDINGS Brain: No evidence of acute infarction, hemorrhage, hydrocephalus, extra-axial collection or mass lesion/mass effect. There is ventricular sulcal enlargement reflecting mild  to moderate generalized atrophy. Patchy white matter hypoattenuation is also noted consistent with moderate to advanced chronic microvascular ischemic change. Hypoattenuation extends to the superior right foot to lobe which may reflect an old infarct. These findings are stable. Vascular: No hyperdense  vessel or unexpected calcification. Skull: Normal. Negative for fracture or focal lesion. Sinuses/Orbits: Globes and orbits are unremarkable. Visualized sinuses and mastoid air cells are clear. Other: None. CT CERVICAL SPINE FINDINGS Alignment: Normal. Skull base and vertebrae: No acute fracture. No primary bone lesion or focal pathologic process. Soft tissues and spinal canal: No prevertebral fluid or swelling. No visible canal hematoma. Disc levels: The cervical discs are well preserved in height. No significant disc bulging and no evidence of a disc herniation. There are facet degenerative changes bilaterally, greatest in the upper to mid cervical spine. Upper chest: No soft tissue masses or adenopathy. There are arterial vascular calcifications and a right carotid stent. Lung apices are clear. Other: None. IMPRESSION: HEAD CT 1. No acute intracranial abnormalities.  No skull fracture. 2. Atrophy and chronic microvascular ischemic change stable from the prior head CT. CERVICAL CT 1. No fracture or acute finding. Electronically Signed   By: Lajean Manes M.D.   On: 11/04/2017 12:14   Mr Lumbar Spine Wo Contrast  Result Date: 11/05/2017 CLINICAL DATA:  84 y/o F; mechanical fall, back pain, suspected cauda equina. History of CML. EXAM: MRI LUMBAR SPINE WITHOUT CONTRAST TECHNIQUE: Multiplanar, multisequence MR imaging of the lumbar spine was performed. No intravenous contrast was administered. COMPARISON:  08/11/2017 lumbar spine MRI. FINDINGS: Segmentation: 5 lumbar type non-rib-bearing vertebral bodies. 11 thoracic vertebral segments on prior study. Alignment:  Minimal L4-5 anterolisthesis. Vertebrae: Interval L2 superior endplate fracture with 98% central loss of height and mild edema indicating recent injury. Post augmentation of L1 vertebral body compression deformity. Stable moderate to severe compression deformity of lowest thoracic vertebral body. Bony retropulsion at T10-11 and T11-L1 with ventral thecal  sac effacement. No significant canal stenosis. No additional fracture identified. Trace L4-5 facet effusions, likely degenerative. Conus medullaris and cauda equina: Conus extends to the level. Conus and cauda equina appear normal. Paraspinal and other soft tissues: Negative. Disc levels: L1-2: No significant disc displacement, foraminal stenosis, or canal stenosis. L2-3: Small disc bulge. No significant foraminal or canal stenosis. L3-4: Small disc bulge. No significant foraminal or canal stenosis. L4-5: Minimal anterolisthesis with small disc bulge, facet, and ligamentum flavum hypertrophy. Mild bilateral foraminal. No significant canal stenosis. L5-S1: Small disc bulge and moderate facet hypertrophy. No significant foraminal or canal stenosis. IMPRESSION: 1. Interval L2 superior endplate fracture with 92% central loss of vertebral body height and mild edema indicating recent injury. 2. Stable T11 and L1 compression deformities. L1 kyphoplasty changes. 3. Stable L3-S1 spondylosis greatest at L4-5 and L5-S1. No high-grade foraminal or canal stenosis. 4. L4-5 trace facet effusions, likely degenerative. Electronically Signed   By: Kristine Garbe M.D.   On: 11/05/2017 22:50    EKG: Orders placed or performed during the hospital encounter of 11/04/17  . ED EKG  . ED EKG  . EKG 12-Lead  . EKG 12-Lead  . EKG 12-Lead  . EKG 12-Lead    IMPRESSION AND PLAN:  1.  Acute L2 compression fracture.  We will have orthopedics and PT/OT evaluate the patient for further recommendations.  Continue pain control.  2.  CML, stable, continue to follow-up with hematology-oncology, as outpatient. 3.  CKD 3, stable, creatinine is at baseline.  Continue to monitor kidney function closely  and avoid nephrotoxic medications. 4.  Hypertension, stable we will restart home medications.  All the records are reviewed and case discussed with ED provider. Management plans discussed with the patient, family and they are in  agreement.  CODE STATUS: Code Status History    Date Active Date Inactive Code Status Order ID Comments User Context   08/11/2017 07:33 08/13/2017 20:53 Full Code [id number]  Hillary Bow, MD ED   04/28/2016 18:20 04/30/2016 17:21 Full Code [id number]  [name], MD Inpatient   03/14/2015 12:55 03/15/2015 18:08 Full Code [id number]  [name], MD Inpatient   01/29/2015 05:27 01/30/2015 17:24 Full Code [id number]  Harrie Foreman, MD Inpatient       TOTAL TIME TAKING CARE OF THIS PATIENT: 40 minutes.    Amelia Jo M.D on 11/05/2017 at 11:52 PM  Between 7am to 6pm - Pager - [phone]  After 6pm go to [email or web address] - password [email or web address] [email or web address]  [facility]  Office  [phone]  CC: Primary care physician; [name], MD

## 2017-11-06 LAB — BASIC METABOLIC PANEL
Anion gap: 8 (ref 5–15)
BUN: 23 mg/dL — ABNORMAL HIGH (ref 6–20)
CO2: 25 mmol/L (ref 22–32)
Calcium: 9.7 mg/dL (ref 8.9–10.3)
Chloride: 105 mmol/L (ref 101–111)
Creatinine, Ser: 0.93 mg/dL (ref 0.44–1.00)
GFR calc Af Amer: >60 mL/min (ref >60)
GFR calc non Af Amer: 55 mL/min — ABNORMAL LOW (ref >60)
Glucose, Bld: 113 mg/dL — ABNORMAL HIGH (ref 65–99)
Potassium: 4.3 mmol/L (ref 3.5–5.1)
Sodium: 138 mmol/L (ref 135–145)

## 2017-11-06 LAB — CBC
HCT: 32.9 % — ABNORMAL LOW (ref 35.0–47.0)
Hemoglobin: 10.2 g/dL — ABNORMAL LOW (ref 12.0–16.0)
MCH: 19.4 pg — ABNORMAL LOW (ref 26.0–34.0)
MCHC: 31.1 g/dL — ABNORMAL LOW (ref 32.0–36.0)
MCV: 62.4 fL — ABNORMAL LOW (ref 80.0–100.0)
Platelets: 82 10*3/uL — ABNORMAL LOW (ref 150–440)
RBC: 5.27 MIL/uL — ABNORMAL HIGH (ref 3.80–5.20)
RDW: 16.8 % — ABNORMAL HIGH (ref 11.5–14.5)
WBC: 7.5 10*3/uL (ref 3.6–11.0)

## 2017-11-06 LAB — GLUCOSE, CAPILLARY: Glucose-Capillary: 109 mg/dL — ABNORMAL HIGH (ref 65–99)

## 2017-11-06 MED ORDER — TRAZODONE HCL 50 MG PO TABS
25.0000 mg | ORAL_TABLET | Freq: Every evening | ORAL | Status: DC | PRN
  Administered 2017-11-07: 25 mg via ORAL
  Filled 2017-11-06: qty 1

## 2017-11-06 MED ORDER — ACETAMINOPHEN 325 MG PO TABS
650.0000 mg | ORAL_TABLET | Freq: Four times a day (QID) | ORAL | Status: DC | PRN
  Administered 2017-11-06 – 2017-11-09 (×4): 650 mg via ORAL
  Filled 2017-11-06 (×4): qty 2

## 2017-11-06 MED ORDER — FOLIC ACID 1 MG PO TABS
1.0000 mg | ORAL_TABLET | Freq: Every day | ORAL | Status: DC
  Administered 2017-11-06 – 2017-11-09 (×3): 1 mg via ORAL
  Filled 2017-11-06 (×3): qty 1

## 2017-11-06 MED ORDER — AMLODIPINE BESYLATE 5 MG PO TABS
2.5000 mg | ORAL_TABLET | Freq: Once | ORAL | Status: AC
  Administered 2017-11-06: 2.5 mg via ORAL

## 2017-11-06 MED ORDER — EPOETIN ALFA 10000 UNIT/ML IJ SOLN
10000.0000 [IU] | Freq: Once | INTRAMUSCULAR | Status: AC
  Administered 2017-11-06: 10000 [IU] via SUBCUTANEOUS
  Filled 2017-11-06: qty 1

## 2017-11-06 MED ORDER — CYANOCOBALAMIN 1000 MCG/ML IJ SOLN: 1000.0000 ug | INTRAMUSCULAR | Status: DC

## 2017-11-06 MED ORDER — BACITRACIN-NEOMYCIN-POLYMYXIN OINTMENT TUBE
TOPICAL_OINTMENT | Freq: Every day | CUTANEOUS | Status: DC
  Administered 2017-11-06: 1 via TOPICAL
  Administered 2017-11-07 – 2017-11-09 (×3): via TOPICAL
  Filled 2017-11-06: qty 14.17

## 2017-11-06 MED ORDER — BISACODYL 10 MG RE SUPP
10.0000 mg | Freq: Once | RECTAL | Status: AC
  Administered 2017-11-09: 10 mg via RECTAL
  Filled 2017-11-06: qty 1

## 2017-11-06 MED ORDER — VITAMIN E 180 MG (400 UNIT) PO CAPS
400.0000 [IU] | ORAL_CAPSULE | Freq: Every day | ORAL | Status: DC
  Administered 2017-11-06 – 2017-11-09 (×3): 400 [IU] via ORAL
  Filled 2017-11-06 (×4): qty 1

## 2017-11-06 MED ORDER — DOCUSATE SODIUM 100 MG PO CAPS
100.0000 mg | ORAL_CAPSULE | Freq: Two times a day (BID) | ORAL | Status: DC
  Administered 2017-11-06 (×2): 100 mg via ORAL
  Filled 2017-11-06 (×2): qty 1

## 2017-11-06 MED ORDER — CARVEDILOL 3.125 MG PO TABS
6.2500 mg | ORAL_TABLET | Freq: Two times a day (BID) | ORAL | Status: DC
  Administered 2017-11-06 – 2017-11-09 (×8): 6.25 mg via ORAL
  Filled 2017-11-06 (×8): qty 2

## 2017-11-06 MED ORDER — LEVOTHYROXINE SODIUM 50 MCG PO TABS
50.0000 ug | ORAL_TABLET | Freq: Every day | ORAL | Status: DC
  Administered 2017-11-06 – 2017-11-09 (×4): 50 ug via ORAL
  Filled 2017-11-06 (×4): qty 1

## 2017-11-06 MED ORDER — DOCUSATE SODIUM 100 MG PO CAPS
200.0000 mg | ORAL_CAPSULE | Freq: Two times a day (BID) | ORAL | Status: DC
  Administered 2017-11-06 – 2017-11-09 (×4): 200 mg via ORAL
  Filled 2017-11-06 (×5): qty 2

## 2017-11-06 MED ORDER — AMLODIPINE BESYLATE 5 MG PO TABS
ORAL_TABLET | ORAL | Status: AC
  Filled 2017-11-06: qty 1

## 2017-11-06 MED ORDER — ASPIRIN EC 81 MG PO TBEC
81.0000 mg | DELAYED_RELEASE_TABLET | Freq: Every day | ORAL | Status: DC
  Administered 2017-11-06 – 2017-11-09 (×3): 81 mg via ORAL
  Filled 2017-11-06 (×3): qty 1

## 2017-11-06 MED ORDER — VITAMIN D 1000 UNITS PO TABS
1000.0000 [IU] | ORAL_TABLET | Freq: Every day | ORAL | Status: DC
  Administered 2017-11-06 – 2017-11-09 (×3): 1000 [IU] via ORAL
  Filled 2017-11-06 (×3): qty 1

## 2017-11-06 MED ORDER — HYDROCODONE-ACETAMINOPHEN 5-325 MG PO TABS
1.0000 | ORAL_TABLET | ORAL | Status: DC | PRN
  Administered 2017-11-07 – 2017-11-09 (×2): 1 via ORAL
  Filled 2017-11-06 (×2): qty 1

## 2017-11-06 MED ORDER — LACTULOSE 10 GM/15ML PO SOLN
30.0000 g | Freq: Two times a day (BID) | ORAL | Status: DC
  Administered 2017-11-06: 30 g via ORAL
  Administered 2017-11-09: 15 g via ORAL
  Filled 2017-11-06 (×6): qty 60

## 2017-11-06 MED ORDER — ONDANSETRON HCL 4 MG PO TABS: 4.0000 mg | ORAL_TABLET | Freq: Four times a day (QID) | ORAL | Status: DC | PRN

## 2017-11-06 MED ORDER — VITAMIN C 500 MG PO TABS
1000.0000 mg | ORAL_TABLET | Freq: Every day | ORAL | Status: DC
  Administered 2017-11-06 – 2017-11-09 (×3): 1000 mg via ORAL
  Filled 2017-11-06 (×4): qty 2

## 2017-11-06 MED ORDER — BISACODYL 5 MG PO TBEC
5.0000 mg | DELAYED_RELEASE_TABLET | Freq: Every day | ORAL | Status: DC | PRN
  Administered 2017-11-06: 5 mg via ORAL
  Filled 2017-11-06 (×2): qty 1

## 2017-11-06 MED ORDER — AMLODIPINE BESYLATE 5 MG PO TABS
5.0000 mg | ORAL_TABLET | Freq: Every day | ORAL | Status: DC
  Administered 2017-11-06 – 2017-11-09 (×4): 5 mg via ORAL
  Filled 2017-11-06 (×4): qty 1

## 2017-11-06 MED ORDER — ACETAMINOPHEN 650 MG RE SUPP: 650.0000 mg | Freq: Four times a day (QID) | RECTAL | Status: DC | PRN

## 2017-11-06 MED ORDER — HEPARIN SODIUM (PORCINE) 5000 UNIT/ML IJ SOLN
5000.0000 [IU] | Freq: Three times a day (TID) | INTRAMUSCULAR | Status: DC
  Administered 2017-11-06 – 2017-11-07 (×6): 5000 [IU] via SUBCUTANEOUS
  Filled 2017-11-06 (×6): qty 1

## 2017-11-06 MED ORDER — ONDANSETRON HCL 4 MG/2ML IJ SOLN
4.0000 mg | Freq: Four times a day (QID) | INTRAMUSCULAR | Status: DC | PRN
  Administered 2017-11-07: 4 mg via INTRAVENOUS
  Filled 2017-11-06: qty 2

## 2017-11-06 NOTE — Progress Notes (Signed)
[place] [facility] [place] NAME: Stephanie Harris    MR#:  [id number]  DATE OF BIRTH:  1931-04-23  SUBJECTIVE:  CHIEF COMPLAINT:   Chief Complaint  Patient presents with  . Back Pain  Complains of constipation for a week, continued back pain  REVIEW OF SYSTEMS:  CONSTITUTIONAL: No fever, fatigue or weakness.  EYES: No blurred or double vision.  EARS, NOSE, AND THROAT: No tinnitus or ear pain.  RESPIRATORY: No cough, shortness of breath, wheezing or hemoptysis.  CARDIOVASCULAR: No chest pain, orthopnea, edema.  GASTROINTESTINAL: No nausea, vomiting, diarrhea or abdominal pain.  GENITOURINARY: No dysuria, hematuria.  ENDOCRINE: No polyuria, nocturia,  HEMATOLOGY: No anemia, easy bruising or bleeding SKIN: No rash or lesion. MUSCULOSKELETAL: No joint pain or arthritis.   NEUROLOGIC: No tingling, numbness, weakness.  PSYCHIATRY: No anxiety or depression.   ROS  DRUG ALLERGIES:   Allergies  Allergen Reactions  . Latex Rash  . Penicillins Other (See Comments)    Has patient had a PCN reaction causing immediate rash, facial/tongue/throat swelling, SOB or lightheadedness with hypotension: Unknown Has patient had a PCN reaction causing severe rash involving mucus membranes or skin necrosis: Unknown Has patient had a PCN reaction that required hospitalization: Unknown Has patient had a PCN reaction occurring within the last 10 years: Unknown If all of the above answers are "NO", then may proceed with Cephalosporin use.     VITALS:  Blood pressure (!) 152/74, pulse 81, temperature 98.6 F (37 C), temperature source Oral, resp. rate 16, weight 36.3 kg (80 lb), SpO2 100 %.  PHYSICAL EXAMINATION:  GENERAL:  84 y.o.-year-old patient lying in the bed with no acute distress.  EYES: Pupils equal, round, reactive to light and accommodation. No scleral icterus. Extraocular muscles intact.  HEENT: Head atraumatic, normocephalic. Oropharynx and nasopharynx  clear.  NECK:  Supple, no jugular venous distention. No thyroid enlargement, no tenderness.  LUNGS: Normal breath sounds bilaterally, no wheezing, rales,rhonchi or crepitation. No use of accessory muscles of respiration.  CARDIOVASCULAR: S1, S2 normal. No murmurs, rubs, or gallops.  ABDOMEN: Soft, nontender, nondistended. Bowel sounds present. No organomegaly or mass.  EXTREMITIES: No pedal edema, cyanosis, or clubbing.  NEUROLOGIC: Cranial nerves II through XII are intact. Muscle strength 5/5 in all extremities. Sensation intact. Gait not checked.  PSYCHIATRIC: The patient is alert and oriented x 3.  SKIN: No obvious rash, lesion, or ulcer.   Physical Exam LABORATORY PANEL:   CBC Recent Labs  Lab 11/06/17 0335  WBC 7.5  HGB 10.2*  HCT 32.9*  PLT 82*   ------------------------------------------------------------------------------------------------------------------  Chemistries  Recent Labs  Lab 11/06/17 0335  NA 138  K 4.3  CL 105  CO2 25  GLUCOSE 113*  BUN 23*  CREATININE 0.93  CALCIUM 9.7   ------------------------------------------------------------------------------------------------------------------  Cardiac Enzymes No results for input(s): TROPONINI in the last 168 hours. ------------------------------------------------------------------------------------------------------------------  RADIOLOGY:  Dg Thoracic Spine 2 View  Result Date: 11/05/2017 CLINICAL DATA:  84 year old female status post fall yesterday. Previous thoracic and lumbar compression fractures. EXAM: THORACIC SPINE 2 VIEWS COMPARISON:  Cervical spine CT 11/04/2017. Images from vertebral augmentation 08/12/2017. Thoracic and lumbar MRI 08/11/2017. FINDINGS: Eleven rib-bearing vertebra. Absent ribs at T12. Chronic T11 and T12 compression fractures, the latter has been previously augmented. Visible lumbar levels remain intact. No new thoracic spine compression fracture identified. Mildly exaggerated  thoracic scoliosis. Cervicothoracic junction alignment is within normal limits. Posterior ribs appear intact. Thoracic visceral contours appear stable. IMPRESSION: 1.  This numbering system differs from that on the 08/11/2017 MRIs. 2. Chronic compression fractures at T11 and T12 (absent ribs at T12). Previous T12 augmentation. 3.  No acute osseous abnormality identified. Electronically Signed   By: Genevie Ann M.D.   On: 11/05/2017 18:15   Dg Lumbar Spine Complete  Result Date: 11/05/2017 CLINICAL DATA:  Fall yesterday.  Low back pain. EXAM: LUMBAR SPINE - COMPLETE 4+ VIEW COMPARISON:  07/31/2017 CT abdomen/pelvis FINDINGS: This report assumes 5 non rib-bearing lumbar vertebrae. Severe T12 chronic stable vertebral compression fracture. Severe chronic L1 vertebral compression fracture status post vertebroplasty. Remaining lumbar vertebral body heights are preserved, with no evidence of acute fracture. Moderate multilevel lumbar spondylosis, most prominent at L2-3. No acute spondylolisthesis. Stable mild 2 mm retrolisthesis at L3-4 and mild 3 mm anterolisthesis at L4-5. Mild bilateral lower lumbar facet arthropathy. No aggressive appearing focal osseous lesions. Cholecystectomy clip is seen in the right upper quadrant of the abdomen. Abdominal aortic atherosclerosis. IMPRESSION: 1. No acute lumbar spine fracture or malalignment. 2. Stable chronic T12 and L1 vertebral compression fractures with vertebroplasty at L1. 3. Moderate multilevel lumbar degenerative disc disease, stable. 4. Lower lumbar facet arthropathy. 5. Mild multilevel lumbar spondylolisthesis, stable. Electronically Signed   By: Ilona Sorrel M.D.   On: 11/05/2017 22:18   Dg Forearm Left  Result Date: 11/04/2017 CLINICAL DATA:  Left forearm pain with a hematoma posterior to the wrist due to a fall today. Initial encounter. EXAM: LEFT FOREARM - 2 VIEW COMPARISON:  None. FINDINGS: There is no evidence of fracture or other focal bone lesions. Soft tissues  are unremarkable. IMPRESSION: Negative exam. Electronically Signed   By: Inge Rise M.D.   On: 11/04/2017 11:41   Ct Head Wo Contrast  Result Date: 11/04/2017 CLINICAL DATA:  Pt presents to ED via [facility] with c/o fall. Per EMS pt c/o L arm pain with small hematoma to posterior wrist, pt c/o back pain with hx of back surgeries and is currently receiving injections to her back. Pt states she hit her head, denies LOC, states hx of brain bleed. Pt is alert and oriented at this time. EXAM: CT HEAD WITHOUT CONTRAST CT CERVICAL SPINE WITHOUT CONTRAST TECHNIQUE: Multidetector CT imaging of the head and cervical spine was performed following the standard protocol without intravenous contrast. Multiplanar CT image reconstructions of the cervical spine were also generated. COMPARISON:  05/05/2017 FINDINGS: CT HEAD FINDINGS Brain: No evidence of acute infarction, hemorrhage, hydrocephalus, extra-axial collection or mass lesion/mass effect. There is ventricular sulcal enlargement reflecting mild to moderate generalized atrophy. Patchy white matter hypoattenuation is also noted consistent with moderate to advanced chronic microvascular ischemic change. Hypoattenuation extends to the superior right foot to lobe which may reflect an old infarct. These findings are stable. Vascular: No hyperdense vessel or unexpected calcification. Skull: Normal. Negative for fracture or focal lesion. Sinuses/Orbits: Globes and orbits are unremarkable. Visualized sinuses and mastoid air cells are clear. Other: None. CT CERVICAL SPINE FINDINGS Alignment: Normal. Skull base and vertebrae: No acute fracture. No primary bone lesion or focal pathologic process. Soft tissues and spinal canal: No prevertebral fluid or swelling. No visible canal hematoma. Disc levels: The cervical discs are well preserved in height. No significant disc bulging and no evidence of a disc herniation. There are facet degenerative changes bilaterally, greatest in the  upper to mid cervical spine. Upper chest: No soft tissue masses or adenopathy. There are arterial vascular calcifications and a right carotid stent. Lung apices are clear. Other: None.  IMPRESSION: HEAD CT 1. No acute intracranial abnormalities.  No skull fracture. 2. Atrophy and chronic microvascular ischemic change stable from the prior head CT. CERVICAL CT 1. No fracture or acute finding. Electronically Signed   By: Lajean Manes M.D.   On: 11/04/2017 12:14   Ct Cervical Spine Wo Contrast  Result Date: 11/04/2017 CLINICAL DATA:  Pt presents to ED via [facility] with c/o fall. Per EMS pt c/o L arm pain with small hematoma to posterior wrist, pt c/o back pain with hx of back surgeries and is currently receiving injections to her back. Pt states she hit her head, denies LOC, states hx of brain bleed. Pt is alert and oriented at this time. EXAM: CT HEAD WITHOUT CONTRAST CT CERVICAL SPINE WITHOUT CONTRAST TECHNIQUE: Multidetector CT imaging of the head and cervical spine was performed following the standard protocol without intravenous contrast. Multiplanar CT image reconstructions of the cervical spine were also generated. COMPARISON:  05/05/2017 FINDINGS: CT HEAD FINDINGS Brain: No evidence of acute infarction, hemorrhage, hydrocephalus, extra-axial collection or mass lesion/mass effect. There is ventricular sulcal enlargement reflecting mild to moderate generalized atrophy. Patchy white matter hypoattenuation is also noted consistent with moderate to advanced chronic microvascular ischemic change. Hypoattenuation extends to the superior right foot to lobe which may reflect an old infarct. These findings are stable. Vascular: No hyperdense vessel or unexpected calcification. Skull: Normal. Negative for fracture or focal lesion. Sinuses/Orbits: Globes and orbits are unremarkable. Visualized sinuses and mastoid air cells are clear. Other: None. CT CERVICAL SPINE FINDINGS Alignment: Normal. Skull base and vertebrae: No  acute fracture. No primary bone lesion or focal pathologic process. Soft tissues and spinal canal: No prevertebral fluid or swelling. No visible canal hematoma. Disc levels: The cervical discs are well preserved in height. No significant disc bulging and no evidence of a disc herniation. There are facet degenerative changes bilaterally, greatest in the upper to mid cervical spine. Upper chest: No soft tissue masses or adenopathy. There are arterial vascular calcifications and a right carotid stent. Lung apices are clear. Other: None. IMPRESSION: HEAD CT 1. No acute intracranial abnormalities.  No skull fracture. 2. Atrophy and chronic microvascular ischemic change stable from the prior head CT. CERVICAL CT 1. No fracture or acute finding. Electronically Signed   By: Lajean Manes M.D.   On: 11/04/2017 12:14   Mr Lumbar Spine Wo Contrast  Result Date: 11/05/2017 CLINICAL DATA:  84 y/o F; mechanical fall, back pain, suspected cauda equina. History of CML. EXAM: MRI LUMBAR SPINE WITHOUT CONTRAST TECHNIQUE: Multiplanar, multisequence MR imaging of the lumbar spine was performed. No intravenous contrast was administered. COMPARISON:  08/11/2017 lumbar spine MRI. FINDINGS: Segmentation: 5 lumbar type non-rib-bearing vertebral bodies. 11 thoracic vertebral segments on prior study. Alignment:  Minimal L4-5 anterolisthesis. Vertebrae: Interval L2 superior endplate fracture with 40% central loss of height and mild edema indicating recent injury. Post augmentation of L1 vertebral body compression deformity. Stable moderate to severe compression deformity of lowest thoracic vertebral body. Bony retropulsion at T10-11 and T11-L1 with ventral thecal sac effacement. No significant canal stenosis. No additional fracture identified. Trace L4-5 facet effusions, likely degenerative. Conus medullaris and cauda equina: Conus extends to the level. Conus and cauda equina appear normal. Paraspinal and other soft tissues: Negative. Disc  levels: L1-2: No significant disc displacement, foraminal stenosis, or canal stenosis. L2-3: Small disc bulge. No significant foraminal or canal stenosis. L3-4: Small disc bulge. No significant foraminal or canal stenosis. L4-5: Minimal anterolisthesis with small disc bulge,  facet, and ligamentum flavum hypertrophy. Mild bilateral foraminal. No significant canal stenosis. L5-S1: Small disc bulge and moderate facet hypertrophy. No significant foraminal or canal stenosis. IMPRESSION: 1. Interval L2 superior endplate fracture with 76% central loss of vertebral body height and mild edema indicating recent injury. 2. Stable T11 and L1 compression deformities. L1 kyphoplasty changes. 3. Stable L3-S1 spondylosis greatest at L4-5 and L5-S1. No high-grade foraminal or canal stenosis. 4. L4-5 trace facet effusions, likely degenerative. Electronically Signed   By: Kristine Garbe M.D.   On: 11/05/2017 22:50    ASSESSMENT AND PLAN:  1.  Acute L2 compression fracture Stable  continue adult pain protocol, Orthopedic surgery/Dr. Rudene Christians to see, and continue close medical monitoring   2.  CML, stable Stable Will f/u with hematology-oncology as outpatient  3.  CKD 3 Stable Cr at BL Avoid nephrotoxic agents   4.  Hypertension Stable on home regiment  5.  Acute on chronic moderate to severe constipation Increase Colace twice daily, give lactulose twice daily for 2 doses, Dulcolax suppository x1  All the records are reviewed and case discussed with Care Management/Social Workerr. Management plans discussed with the patient, family and they are in agreement.  CODE STATUS: full   TOTAL TIME TAKING CARE OF THIS PATIENT: 35 minutes.     POSSIBLE D/C IN 2-4 DAYS, DEPENDING ON CLINICAL CONDITION.   Avel Peace Salary M.D on 11/06/2017   Between 7am to 6pm - Pager - [phone]  After 6pm go to [email or web address] - password [email or web address] [facility]  Office  [phone]  CC: Primary  care physician; [name], MD  Note: This dictation was prepared with Dragon dictation along with smaller phrase technology. Any transcriptional errors that result from this process are unintentional.

## 2017-11-06 NOTE — Progress Notes (Signed)
Pt alert and oriented. Resting in room. IV saline locked. Pt reported mild amount of pain this AM. PRN Tylenol given with relief. Pt able to have BM today so refused lactulose and suppository at this time. No other complaints from pt.

## 2017-11-06 NOTE — Progress Notes (Signed)
Physical Therapy Evaluation Patient Details Name: Stephanie Harris MRN: [id number] DOB: 1931-03-28 Today's Date: 11/06/2017   History of Present Illness  Stephanie Harris  is a 84 y.o. female with a known history of chronic myelocytic leukemia, hypertension, osteoarthritis and chronic hemolytic anemia. Patient was brought to emergency room for intractable lower back pain and generalized weakness status post mechanical fall at home.  She lost her balance while walking without a walker in her mobile home.  Patient is currently unable to ambulate on her own due to lower back pain and generalized weakness.  Her husband is older than her and has his own medical problems.  He is not strong enough to help her ambulate and he is worried that they could both fall.  Patient denies any focal weakness; she can move all her extremities; she denies pain or numbness in her lower extremities. MRI of the lumbar spine, done in the emergency room showed interval L2 superior endplate fracture with 78% central loss of vertebral body height and mild edema indicating recent injury.  Other old fractures at thoracic and lumbar spine level are noted to be stable. Pt recently reports she underwent a L1 kyhoplasty. Patient is admitted for further evaluation and pain control. She is awaiting consult by orthopedist.  Clinical Impression  Pt admitted with above diagnosis. Pt currently with functional limitations due to the deficits listed below (see PT Problem List). Pt requires modA+1 for rolling onto L side and pushing up into sitting. Cues for proper log rolling technique. Increase in pain with sitting. Pt able to remain upright in sitting without support. Pt requires cues for hand placement and seuqencing with transfers. She pushes backwards and requires min to modA+1 to remain standing. Attempted standing marches but pt is falling backwards. Unable to ambulate at this time. She reports increase in pain with all activity. Will continue to  follow during admission and assess after orthopedic consult pending plan of care. Pt will benefit from PT services to address deficits in strength, balance, and mobility in order to return to full function at home.        Follow Up Recommendations SNF    Equipment Recommendations  None recommended by PT    Recommendations for Other Services       Precautions / Restrictions Precautions Precautions: Fall Restrictions Weight Bearing Restrictions: No Other Position/Activity Restrictions: Has lumbar corset brace since last Thursday, donned at end of session after husband brings it to the room when he arrives.      Mobility  Bed Mobility Overal bed mobility: Needs Assistance Bed Mobility: Rolling Rolling: Mod assist         General bed mobility comments: Pt requires modA+1 for rolling onto L side and pushing up into sitting. Cues for proper log rolling technique. Increase in pain with sitting. Pt able to remain upright in sitting without support  Transfers Overall transfer level: Needs assistance Equipment used: Rolling walker (2 wheeled) Transfers: Sit to/from Stand Sit to Stand: Mod assist         General transfer comment: Pt requires cues for hand placement and seuqencing with transfers. She pushes backwards and requires min to modA+1 to remain standing. Attempted standing marches but pt is falling backwards. Unable to ambulate at this time  Ambulation/Gait                Stairs            Wheelchair Mobility    Modified Rankin (Stroke Patients Only)  Balance Overall balance assessment: Needs assistance;History of Falls Sitting-balance support: No upper extremity supported Sitting balance-Leahy Scale: Fair     Standing balance support: Bilateral upper extremity supported Standing balance-Leahy Scale: Poor                               Pertinent Vitals/Pain Pain Assessment: Faces Pain Score: 8  Faces Pain Scale: Hurts even  more Pain Location: Low back and bottom of feet (feet with pins and needles) Pain Descriptors / Indicators: Aching;Burning;Pins and needles Pain Intervention(s): Limited activity within patient's tolerance;Monitored during session;Repositioned    Home Living Family/patient expects to be discharged to:: Private residence Living Arrangements: Spouse/significant other Available Help at Discharge: Family;Available 24 hours/day Type of Home: Mobile home Home Access: Ramped entrance     Home Layout: One level Home Equipment: [place] - 4 wheels;Shower seat;Bedside commode;Cane - single point Additional Comments: uses BSC over toilet at home    Prior Function Level of Independence: Needs assistance   Gait / Transfers Assistance Needed: Pt ambulates with rollator. Reports at least 6 falls in the last 12 months   ADL's / Homemaking Assistance Needed: Pt requires assist for ADLs/IADLs - spouse drives, assists with tub transfers, PRN assist for donning shoes, does most household chores  Comments: Mod indep for limited household mobility with 4WRW; husband assists with ADLs, household chores as needed.     Hand Dominance   Dominant Hand: Right    Extremity/Trunk Assessment   Upper Extremity Assessment Upper Extremity Assessment: Generalized weakness LUE Deficits / Details: L shoulder weakness/pain which is chronic    Lower Extremity Assessment Lower Extremity Assessment: Generalized weakness(Limited testing due to pain but generally weak)       Communication   Communication: No difficulties  Cognition Arousal/Alertness: Awake/alert Behavior During Therapy: WFL for tasks assessed/performed Overall Cognitive Status: Within Functional Limits for tasks assessed                                        General Comments      Exercises     Assessment/Plan    PT Assessment Patient needs continued PT services  PT Problem List Decreased strength;Decreased activity  tolerance;Decreased balance;Decreased mobility;Pain       PT Treatment Interventions DME instruction;Gait training;Stair training;Functional mobility training;Therapeutic activities;Therapeutic exercise;Balance training;Neuromuscular re-education;Patient/family education    PT Goals (Current goals can be found in the Care Plan section)  Acute Rehab PT Goals Patient Stated Goal: do rehab and get stronger PT Goal Formulation: With patient/family Time For Goal Achievement: 11/20/17 Potential to Achieve Goals: Good    Frequency 7X/week   Barriers to discharge        Co-evaluation               AM-PAC PT "6 Clicks" Daily Activity  Outcome Measure Difficulty turning over in bed (including adjusting bedclothes, sheets and blankets)?: Unable Difficulty moving from lying on back to sitting on the side of the bed? : Unable Difficulty sitting down on and standing up from a chair with arms (e.g., wheelchair, bedside commode, etc,.)?: Unable Help needed moving to and from a bed to chair (including a wheelchair)?: A Lot Help needed walking in hospital room?: Total Help needed climbing 3-5 steps with a railing? : Total 6 Click Score: 7    End of Session Equipment Utilized During Treatment:  Gait belt Activity Tolerance: Patient tolerated treatment well Patient left: in bed;with call bell/phone within reach;with nursing/sitter in room Nurse Communication: Mobility status PT Visit Diagnosis: Unsteadiness on feet (R26.81);Repeated falls (R29.6);Muscle weakness (generalized) (M62.81);Difficulty in walking, not elsewhere classified (R26.2);Pain Pain - part of body: (Low back pain)    Time: 0093-8182 PT Time Calculation (min) (ACUTE ONLY): 23 min   Charges:   PT Evaluation $PT Eval Low Complexity: 1 Low PT Treatments $Therapeutic Activity: 8-22 mins   PT G Codes:        Lyndel Safe Lilyauna Miedema PT, DPT    Darryel Diodato 11/06/2017, 1:07 PM

## 2017-11-06 NOTE — Consult Note (Signed)
ORTHOPAEDIC CONSULTATION  REQUESTING PHYSICIAN: Salary, Avel Peace, MD  Chief Complaint:   Acute onset of lower back pain.  History of Present Illness: Stephanie Harris is a 84 y.o. female with multiple medical problems including rheumatoid arthritis, heart disease, hypertension, and chronic myelocytic leukemia for which she is being treated at [facility].  The patient apparently was scheduled to undergo an injection for her leukemia on Thursday.  However, while preparing herself to make this appointment, she fell in her home, landing on her buttock.  Because of the increased pain, she was unable to attend this appointment.  Because of her pain pain, she presented to the emergency room.  She was worked up and then sent back home.  However, her symptoms worsen during the course the day, prompting her to return to the emergency room for further evaluation and treatment.  An MRI scan of her lumbar spine was obtained which confirmed the presence of an acute superior endplate fracture at L2 through the patient was admitted for pain control and further evaluation and treatment of this condition.  The patient denies any associated injuries resulting from her fall.  She did not strike her head or lose consciousness.  The patient also denies any lightheadedness, dizziness, chest pain, or other symptoms that may have precipitated her fall.  However, she does note that she is "weak".  Past Medical History:  Diagnosis Date  . CML (chronic myelocytic leukemia) ([place])    Onc at [facility]  . Heart disease   . Hypertension   . Leukemia ([place])   . RA (rheumatoid arthritis) ([place])   . Stroke ([place])   . Thalassemia    Past Surgical History:  Procedure Laterality Date  . ABDOMINAL HYSTERECTOMY    . CAROTID ENDARTERECTOMY Left   . CATARACT EXTRACTION    . CHOLECYSTECTOMY    . KYPHOPLASTY N/A 08/12/2017   Procedure: KYPHOPLASTY;  Surgeon: Hessie Knows, MD;  Location: [facility] ORS;  Service: Orthopedics;  Laterality: N/A;  . PERCUTANEOUS CORONARY STENT INTERVENTION (PCI-S)    . ROTATOR CUFF REPAIR Left   . TOTAL KNEE ARTHROPLASTY Right    Social History   Socioeconomic History  . Marital status: Married    Spouse name: None  . Number of children: None  . Years of education: None  . Highest education level: None  Social Needs  . Financial resource strain: None  . Food insecurity - worry: None  . Food insecurity - inability: None  . Transportation needs - medical: None  . Transportation needs - non-medical: None  Occupational History  . None  Tobacco Use  . Smoking status: Never Smoker  . Smokeless tobacco: Never Used  Substance and Sexual Activity  . Alcohol use: No  . Drug use: No  . Sexual activity: No  Other Topics Concern  . None  Social History Narrative  . None   Family History  Problem Relation Age of Onset  . Acute myelogenous leukemia Brother   . Stroke Mother   . Heart attack Father    Allergies  Allergen Reactions  . Latex Rash  . Penicillins Other (See Comments)    Has patient had a PCN reaction causing immediate rash, facial/tongue/throat swelling, SOB or lightheadedness with hypotension: Unknown Has patient had a PCN reaction causing severe rash involving mucus membranes or skin necrosis: Unknown Has patient had a PCN reaction that required hospitalization: Unknown Has patient had a PCN reaction occurring within the last 10 years: Unknown If all of  the above answers are "NO", then may proceed with Cephalosporin use.    Prior to Admission medications   Medication Sig Start Date End Date Taking? Authorizing Provider  amLODipine (NORVASC) 5 MG tablet Take 1 tablet (5 mg total) by mouth daily. 11/04/17 11/04/18 Yes Veronese, [place], MD  ascorbic acid (VITAMIN C) 1000 MG tablet Take 1,000 mg by mouth daily.   Yes [provider]  aspirin 81 MG EC tablet Take 81 mg by mouth daily.    Yes  [provider]  carvedilol (COREG) 3.125 MG tablet Take 6.25 mg by mouth 2 (two) times daily.    Yes [provider]  cholecalciferol (VITAMIN D) 1000 UNITS tablet Take 1,000 Units by mouth daily.   Yes [provider]  cyanocobalamin (,VITAMIN B-12,) 1000 MCG/ML injection Inject 1,000 mcg into the muscle every 30 (thirty) days.   Yes [provider]  docusate sodium (COLACE) 100 MG capsule Take 1 capsule (100 mg total) by mouth daily as needed. Patient taking differently: Take 100 mg by mouth daily as needed for mild constipation.  07/28/17 07/28/18 Yes [name], MD  epoetin alfa (EPOGEN,PROCRIT) 14970 UNIT/ML injection Inject 10,000 Units into the skin every 14 (fourteen) days. 04/08/17  Yes [provider]  folic acid (FOLVITE) 1 MG tablet Take 1 mg by mouth daily.   Yes [provider]  levothyroxine (SYNTHROID, LEVOTHROID) 50 MCG tablet Take 50 mcg by mouth daily. On the opposite days from taking 45mcg.   Yes [provider]  ondansetron (ZOFRAN ODT) 4 MG disintegrating tablet Take 1 tablet (4 mg total) by mouth every 8 (eight) hours as needed for nausea or vomiting. 07/28/17  Yes [name], MD  oxyCODONE-acetaminophen (PERCOCET/ROXICET) 5-325 MG tablet Take 1 tablet by mouth every 6 (six) hours as needed for severe pain. 08/12/17  Yes Sudini, Alveta Heimlich, MD  polyethylene glycol (MIRALAX / GLYCOLAX) packet Take 17 g by mouth daily as needed for mild constipation. 08/12/17  Yes SudiniAlveta Heimlich, MD  vitamin E 400 UNIT capsule Take 400 Units by mouth daily.   Yes [provider]  glimepiride (AMARYL) 1 MG tablet Take 1 mg by mouth daily. 10/28/17   [provider]   Dg Thoracic Spine 2 View  Result Date: 11/05/2017 CLINICAL DATA:  84 year old female status post fall yesterday. Previous thoracic and lumbar compression fractures. EXAM: THORACIC SPINE 2 VIEWS COMPARISON:  Cervical spine CT 11/04/2017. Images  from vertebral augmentation 08/12/2017. Thoracic and lumbar MRI 08/11/2017. FINDINGS: Eleven rib-bearing vertebra. Absent ribs at T12. Chronic T11 and T12 compression fractures, the latter has been previously augmented. Visible lumbar levels remain intact. No new thoracic spine compression fracture identified. Mildly exaggerated thoracic scoliosis. Cervicothoracic junction alignment is within normal limits. Posterior ribs appear intact. Thoracic visceral contours appear stable. IMPRESSION: 1. This numbering system differs from that on the 08/11/2017 MRIs. 2. Chronic compression fractures at T11 and T12 (absent ribs at T12). Previous T12 augmentation. 3.  No acute osseous abnormality identified. Electronically Signed   By: Genevie Ann M.D.   On: 11/05/2017 18:15   Dg Lumbar Spine Complete  Result Date: 11/05/2017 CLINICAL DATA:  Fall yesterday.  Low back pain. EXAM: LUMBAR SPINE - COMPLETE 4+ VIEW COMPARISON:  07/31/2017 CT abdomen/pelvis FINDINGS: This report assumes 5 non rib-bearing lumbar vertebrae. Severe T12 chronic stable vertebral compression fracture. Severe chronic L1 vertebral compression fracture status post vertebroplasty. Remaining lumbar vertebral body heights are preserved, with no evidence of acute fracture. Moderate multilevel  lumbar spondylosis, most prominent at L2-3. No acute spondylolisthesis. Stable mild 2 mm retrolisthesis at L3-4 and mild 3 mm anterolisthesis at L4-5. Mild bilateral lower lumbar facet arthropathy. No aggressive appearing focal osseous lesions. Cholecystectomy clip is seen in the right upper quadrant of the abdomen. Abdominal aortic atherosclerosis. IMPRESSION: 1. No acute lumbar spine fracture or malalignment. 2. Stable chronic T12 and L1 vertebral compression fractures with vertebroplasty at L1. 3. Moderate multilevel lumbar degenerative disc disease, stable. 4. Lower lumbar facet arthropathy. 5. Mild multilevel lumbar spondylolisthesis, stable. Electronically Signed   By:  Ilona Sorrel M.D.   On: 11/05/2017 22:18   Mr Lumbar Spine Wo Contrast  Result Date: 11/05/2017 CLINICAL DATA:  84 y/o F; mechanical fall, back pain, suspected cauda equina. History of CML. EXAM: MRI LUMBAR SPINE WITHOUT CONTRAST TECHNIQUE: Multiplanar, multisequence MR imaging of the lumbar spine was performed. No intravenous contrast was administered. COMPARISON:  08/11/2017 lumbar spine MRI. FINDINGS: Segmentation: 5 lumbar type non-rib-bearing vertebral bodies. 11 thoracic vertebral segments on prior study. Alignment:  Minimal L4-5 anterolisthesis. Vertebrae: Interval L2 superior endplate fracture with 32% central loss of height and mild edema indicating recent injury. Post augmentation of L1 vertebral body compression deformity. Stable moderate to severe compression deformity of lowest thoracic vertebral body. Bony retropulsion at T10-11 and T11-L1 with ventral thecal sac effacement. No significant canal stenosis. No additional fracture identified. Trace L4-5 facet effusions, likely degenerative. Conus medullaris and cauda equina: Conus extends to the level. Conus and cauda equina appear normal. Paraspinal and other soft tissues: Negative. Disc levels: L1-2: No significant disc displacement, foraminal stenosis, or canal stenosis. L2-3: Small disc bulge. No significant foraminal or canal stenosis. L3-4: Small disc bulge. No significant foraminal or canal stenosis. L4-5: Minimal anterolisthesis with small disc bulge, facet, and ligamentum flavum hypertrophy. Mild bilateral foraminal. No significant canal stenosis. L5-S1: Small disc bulge and moderate facet hypertrophy. No significant foraminal or canal stenosis. IMPRESSION: 1. Interval L2 superior endplate fracture with 12% central loss of vertebral body height and mild edema indicating recent injury. 2. Stable T11 and L1 compression deformities. L1 kyphoplasty changes. 3. Stable L3-S1 spondylosis greatest at L4-5 and L5-S1. No high-grade foraminal or canal  stenosis. 4. L4-5 trace facet effusions, likely degenerative. Electronically Signed   By: Kristine Garbe M.D.   On: 11/05/2017 22:50    Positive ROS: All other systems have been reviewed and were otherwise negative with the exception of those mentioned in the HPI and as above.  Physical Exam: General:  Alert, no acute distress Psychiatric:  Patient is competent for consent with normal mood and affect   Cardiovascular:  No pedal edema Respiratory:  No wheezing, non-labored breathing GI:  Abdomen is soft and non-tender Skin:  No lesions in the area of chief complaint Neurologic:  Sensation intact distally Lymphatic:  No axillary or cervical lymphadenopathy  Orthopedic Exam:  Orthopedic examination is limited to her lumbar spine and lower extremities.  The patient has a kyphotic deformity of her thoracolumbar spine, but skin inspection otherwise is unremarkable.  There is no swelling, ecchymosis, abrasions, or other skin abnormalities identified.  The patient has moderate tenderness to percussion near the thoracolumbar junction.  However, she is able to sit herself up in bed without too much discomfort.    She is able to actively dorsiflex and plantarflex her toes and ankles.  Sensation is subjectively diminished to the plantar aspects of both feet, but otherwise is intact.  She has good capillary refill to both feet.  X-rays:  X-rays of the lumbar spine are available for review.  These films demonstrate findings consistent with a prior kyphoplasty at L1, as well as a significant compression fracture at T12.  She also has what appears to be a mild compression fracture at L2 both the T12 and L2 compression fractures are of indeterminate age.  An MRI scan of the lumbar spine which was done yesterday also is available for review.  This demonstrates evidence of bone marrow changes involving the superior endplate of L2, consistent with an acute fracture at this level.  No other acute bony  abnormalities are identified.  Assessment: Acute superior endplate fracture of L2.  Plan: The treatment options are discussed with the patient and her family who is at the bedside.  The patient was quite pleased with the results that she received from the kyphoplasty she had undergone at L1 in December, 2018, by Dr. Rudene Christians, and would like to consider a similar procedure for her L2 compression fracture.  Therefore, I will let Dr. Rudene Christians know of the situation.  Meanwhile, the patient is to receive appropriate pain medication.  She may continue to wear her lumbar support and may be mobilized as symptoms permit.  Thank you for asking me to participate in the care of this most pleasant woman.  I will be happy to follow her with you.   Pascal Lux, MD  Beeper #:  [phone]  11/06/2017 2:26 PM

## 2017-11-06 NOTE — Clinical Social Work Note (Signed)
CSW received consult for placement. PT is pending. CSW will follow pending PT recommendation.  Santiago Bumpers, MSW, Latanya Presser [phone]

## 2017-11-06 NOTE — Evaluation (Signed)
Occupational Therapy Evaluation Patient Details Name: Stephanie Harris MRN: [id number] DOB: 1931-07-21 Today's Date: 11/06/2017    History of Present Illness Stephanie Harris  is a 84 y.o. female with a known history of chronic myelocytic leukemia, hypertension, osteoarthritis and chronic hemolytic anemia. Patient was brought to emergency room for intractable lower back pain and generalized weakness status post mechanical fall at home.  She lost her balance while walking without a walker in her mobile home.  Patient is currently unable to ambulate on her own due to lower back pain and generalized weakness.  Her husband is older than her and has his own medical problems.  He is not strong enough to help her ambulate and he is worried that they could both fall.  Patient denies any focal weakness; she can move all her extremities; she denies pain or numbness in her lower extremities. MRI of the lumbar spine, done in the emergency room showed interval L2 superior endplate fracture with 08% central loss of vertebral body height and mild edema indicating recent injury.  Other old fractures at thoracic and lumbar spine level are noted to be stable. Pt recently reports she underwent a L1 kyhoplasty. Patient is admitted for further evaluation and pain control. She is awaiting consult by orthopedist.   Clinical Impression   Pt seen for OT Evaluation this date. Prior to admission, pt was modified indep with rollator for mobility and requiring assist from spouse for all tub transfers for safety, household tasks, driving, and occasional assist from spouse for donning shoes. 6+ falls in past 12 months due to LOB per pt report. Pt presents with generalized weakness t/o, pins and needles sensation in B feet with impaired dorsiflexion (L worse than R), pain in lumbar region of back with movement, and increased need for assist for all mobility and ADL tasks. Pt will benefit from skilled OT services to address noted impairments  and functional deficits in order to maximize return to PLOF and minimize risk of future falls/injury/readmission and need for higher level of care or increased caregiver burden. Recommend transition to STR following hospitalization. Of note, pt still pending orthopedic consult to determine POC moving forward. Will continue to assess and treat accordingly.     Follow Up Recommendations  SNF    Equipment Recommendations  Other (comment)(TBD)    Recommendations for Other Services       Precautions / Restrictions Precautions Precautions: Fall Restrictions Weight Bearing Restrictions: No Other Position/Activity Restrictions: has lumbar corset brace since last Thursday, donned for session      Mobility Bed Mobility Overal bed mobility: Needs Assistance Bed Mobility: Rolling Rolling: Mod assist         General bed mobility comments: lumbar corsett brace donned, difficulty with placement (appears to be placed too high, but difficult to adjust)  Transfers                      Balance Overall balance assessment: History of Falls(continue to assess next session)                                         ADL either performed or assessed with clinical judgement   ADL Overall ADL's : Needs assistance/impaired Eating/Feeding: Independent;Bed level   Grooming: Bed level;Independent   Upper Body Bathing: Bed level;Minimal assistance;Moderate assistance   Lower Body Bathing: Bed level;Maximal assistance   Upper Body  Dressing : Bed level;Moderate assistance;Minimal assistance   Lower Body Dressing: Bed level;Maximal assistance     Toilet Transfer Details (indicate cue type and reason): unable to assess, recently used bed pan, continue to assess next session                 Vision Baseline Vision/History: Wears glasses Wears Glasses: At all times Patient Visual Report: No change from baseline       Perception     Praxis      Pertinent  Vitals/Pain Pain Assessment: Faces Pain Score: 8  Faces Pain Scale: Hurts even more Pain Location: Low back and bottom of feet (feet with pins and needles) Pain Descriptors / Indicators: Aching;Burning;Pins and needles Pain Intervention(s): Limited activity within patient's tolerance;Monitored during session;Repositioned     Hand Dominance Right   Extremity/Trunk Assessment Upper Extremity Assessment Upper Extremity Assessment: Generalized weakness(weak bilaterally (3+ to 4-/5), chronic L shoulder pain/wk) LUE Deficits / Details: L shoulder weakness/pain which is chronic   Lower Extremity Assessment Lower Extremity Assessment: Defer to PT evaluation;Generalized weakness(grossly weak, "pins and needles" in B feet, decreased dorsiflexion (L worse than R))       Communication Communication Communication: No difficulties   Cognition Arousal/Alertness: Awake/alert Behavior During Therapy: WFL for tasks assessed/performed Overall Cognitive Status: Within Functional Limits for tasks assessed                                     General Comments       Exercises     Shoulder Instructions      Home Living Family/patient expects to be discharged to:: Private residence Living Arrangements: Spouse/significant other Available Help at Discharge: Family;Available 24 hours/day Type of Home: Mobile home Home Access: Ramped entrance     Home Layout: One level     Bathroom Shower/Tub: Teacher, early years/pre: Handicapped height     Home Equipment: Environmental consultant - 4 wheels;Shower seat;Bedside commode;Cane - single point   Additional Comments: uses BSC over toilet at home      Prior Functioning/Environment Level of Independence: Needs assistance  Gait / Transfers Assistance Needed: Pt ambulates with rollator. Reports at least 6 falls in the last 12 months  ADL's / Homemaking Assistance Needed: Pt requires assist for ADLs/IADLs - spouse drives, assists with tub  transfers, PRN assist for donning shoes, does nost household chores   Comments: Mod indep for limited household mobility with 4WRW; husband assists with ADLs, household chores as needed.        OT Problem List: Decreased strength;Decreased knowledge of use of DME or AE;Decreased range of motion;Decreased activity tolerance;Pain;Impaired sensation;Decreased safety awareness;Impaired balance (sitting and/or standing)      OT Treatment/Interventions: Self-care/ADL training;Balance training;Therapeutic exercise;Therapeutic activities;DME and/or AE instruction;Patient/family education    OT Goals(Current goals can be found in the care plan section) Acute Rehab OT Goals Patient Stated Goal: do rehab and get stronger OT Goal Formulation: With patient/family Time For Goal Achievement: 11/20/17 Potential to Achieve Goals: Good ADL Goals Pt Will Perform Lower Body Dressing: with min assist;sit to/from stand;with adaptive equipment;with mod assist Pt Will Transfer to Toilet: stand pivot transfer;with min assist;bedside commode;with mod assist(LRAD for amb)  OT Frequency: Min 2X/week   Barriers to D/C:            Co-evaluation              AM-PAC PT "6 Clicks" Daily  Activity     Outcome Measure Help from another person eating meals?: None Help from another person taking care of personal grooming?: None Help from another person toileting, which includes using toliet, bedpan, or urinal?: A Lot Help from another person bathing (including washing, rinsing, drying)?: A Lot Help from another person to put on and taking off regular upper body clothing?: A Lot Help from another person to put on and taking off regular lower body clothing?: A Lot 6 Click Score: 16   End of Session    Activity Tolerance: Patient limited by pain Patient left: in bed;with call bell/phone within reach;with bed alarm set;with family/visitor present;with SCD's reapplied;Other (comment)(corsett brace in  place)  OT Visit Diagnosis: Other abnormalities of gait and mobility (R26.89);Repeated falls (R29.6);Muscle weakness (generalized) (M62.81);Pain Pain - Right/Left: Left Pain - part of body: Leg(lumbar back, BLE)                Time: 4832-3468 OT Time Calculation (min): 22 min Charges:  OT General Charges $OT Visit: 1 Visit OT Evaluation $OT Eval Moderate Complexity: 1 Mod  Jeni Salles, MPH, MS, OTR/L ascom [phone] 11/06/17, 12:27 PM

## 2017-11-07 MED ORDER — FLEET ENEMA 7-19 GM/118ML RE ENEM: 1.0000 | ENEMA | Freq: Once | RECTAL | Status: DC

## 2017-11-07 NOTE — Progress Notes (Signed)
[facility] NAME: Michon Kaczmarek    MR#:  [id number]  DATE OF BIRTH:  [birth year]  SUBJECTIVE:  CHIEF COMPLAINT:   Chief Complaint  Patient presents with  . Back Pain  Patient would like to salt with her food, diet change to regular diet.  Request  REVIEW OF SYSTEMS:  CONSTITUTIONAL: No fever, fatigue or weakness.  EYES: No blurred or double vision.  EARS, NOSE, AND THROAT: No tinnitus or ear pain.  RESPIRATORY: No cough, shortness of breath, wheezing or hemoptysis.  CARDIOVASCULAR: No chest pain, orthopnea, edema.  GASTROINTESTINAL: No nausea, vomiting, diarrhea or abdominal pain.  GENITOURINARY: No dysuria, hematuria.  ENDOCRINE: No polyuria, nocturia,  HEMATOLOGY: No anemia, easy bruising or bleeding SKIN: No rash or lesion. MUSCULOSKELETAL: No joint pain or arthritis.   NEUROLOGIC: No tingling, numbness, weakness.  PSYCHIATRY: No anxiety or depression.   ROS  DRUG ALLERGIES:   Allergies  Allergen Reactions  . Latex Rash  . Penicillins Other (See Comments)    Has patient had a PCN reaction causing immediate rash, facial/tongue/throat swelling, SOB or lightheadedness with hypotension: Unknown Has patient had a PCN reaction causing severe rash involving mucus membranes or skin necrosis: Unknown Has patient had a PCN reaction that required hospitalization: Unknown Has patient had a PCN reaction occurring within the last 10 years: Unknown If all of the above answers are "NO", then may proceed with Cephalosporin use.     VITALS:  Blood pressure (!) 145/78, pulse 66, temperature 98.2 F (36.8 C), temperature source Oral, resp. rate 17, height 5\' 3"  (1.6 m), weight 37 kg (81 lb 9.1 oz), SpO2 100 %.  PHYSICAL EXAMINATION:  GENERAL:  84 y.o.-year-old patient lying in the bed with no acute distress.  EYES: Pupils equal, round, reactive to light and accommodation. No scleral icterus. Extraocular muscles intact.  HEENT: Head  atraumatic, normocephalic. Oropharynx and nasopharynx clear.  NECK:  Supple, no jugular venous distention. No thyroid enlargement, no tenderness.  LUNGS: Normal breath sounds bilaterally, no wheezing, rales,rhonchi or crepitation. No use of accessory muscles of respiration.  CARDIOVASCULAR: S1, S2 normal. No murmurs, rubs, or gallops.  ABDOMEN: Soft, nontender, nondistended. Bowel sounds present. No organomegaly or mass.  EXTREMITIES: No pedal edema, cyanosis, or clubbing.  NEUROLOGIC: Cranial nerves II through XII are intact. Muscle strength 5/5 in all extremities. Sensation intact. Gait not checked.  PSYCHIATRIC: The patient is alert and oriented x 3.  SKIN: No obvious rash, lesion, or ulcer.   Physical Exam LABORATORY PANEL:   CBC Recent Labs  Lab 11/06/17 0335  WBC 7.5  HGB 10.2*  HCT 32.9*  PLT 82*   ------------------------------------------------------------------------------------------------------------------  Chemistries  Recent Labs  Lab 11/06/17 0335  NA 138  K 4.3  CL 105  CO2 25  GLUCOSE 113*  BUN 23*  CREATININE 0.93  CALCIUM 9.7   ------------------------------------------------------------------------------------------------------------------  Cardiac Enzymes No results for input(s): TROPONINI in the last 168 hours. ------------------------------------------------------------------------------------------------------------------  RADIOLOGY:  Dg Thoracic Spine 2 View  Result Date: 11/05/2017 CLINICAL DATA:  84 year old female status post fall yesterday. Previous thoracic and lumbar compression fractures. EXAM: THORACIC SPINE 2 VIEWS COMPARISON:  Cervical spine CT 11/04/2017. Images from vertebral augmentation 08/12/2017. Thoracic and lumbar MRI 08/11/2017. FINDINGS: Eleven rib-bearing vertebra. Absent ribs at T12. Chronic T11 and T12 compression fractures, the latter has been previously augmented. Visible lumbar levels remain intact. No new thoracic spine  compression fracture identified. Mildly exaggerated thoracic scoliosis. Cervicothoracic junction alignment is within  normal limits. Posterior ribs appear intact. Thoracic visceral contours appear stable. IMPRESSION: 1. This numbering system differs from that on the 08/11/2017 MRIs. 2. Chronic compression fractures at T11 and T12 (absent ribs at T12). Previous T12 augmentation. 3.  No acute osseous abnormality identified. Electronically Signed   By: Genevie Ann M.D.   On: 11/05/2017 18:15   Dg Lumbar Spine Complete  Result Date: 11/05/2017 CLINICAL DATA:  Fall yesterday.  Low back pain. EXAM: LUMBAR SPINE - COMPLETE 4+ VIEW COMPARISON:  07/31/2017 CT abdomen/pelvis FINDINGS: This report assumes 5 non rib-bearing lumbar vertebrae. Severe T12 chronic stable vertebral compression fracture. Severe chronic L1 vertebral compression fracture status post vertebroplasty. Remaining lumbar vertebral body heights are preserved, with no evidence of acute fracture. Moderate multilevel lumbar spondylosis, most prominent at L2-3. No acute spondylolisthesis. Stable mild 2 mm retrolisthesis at L3-4 and mild 3 mm anterolisthesis at L4-5. Mild bilateral lower lumbar facet arthropathy. No aggressive appearing focal osseous lesions. Cholecystectomy clip is seen in the right upper quadrant of the abdomen. Abdominal aortic atherosclerosis. IMPRESSION: 1. No acute lumbar spine fracture or malalignment. 2. Stable chronic T12 and L1 vertebral compression fractures with vertebroplasty at L1. 3. Moderate multilevel lumbar degenerative disc disease, stable. 4. Lower lumbar facet arthropathy. 5. Mild multilevel lumbar spondylolisthesis, stable. Electronically Signed   By: Ilona Sorrel M.D.   On: 11/05/2017 22:18   Mr Lumbar Spine Wo Contrast  Result Date: 11/05/2017 CLINICAL DATA:  84 y/o F; mechanical fall, back pain, suspected cauda equina. History of CML. EXAM: MRI LUMBAR SPINE WITHOUT CONTRAST TECHNIQUE: Multiplanar, multisequence MR  imaging of the lumbar spine was performed. No intravenous contrast was administered. COMPARISON:  08/11/2017 lumbar spine MRI. FINDINGS: Segmentation: 5 lumbar type non-rib-bearing vertebral bodies. 11 thoracic vertebral segments on prior study. Alignment:  Minimal L4-5 anterolisthesis. Vertebrae: Interval L2 superior endplate fracture with 54% central loss of height and mild edema indicating recent injury. Post augmentation of L1 vertebral body compression deformity. Stable moderate to severe compression deformity of lowest thoracic vertebral body. Bony retropulsion at T10-11 and T11-L1 with ventral thecal sac effacement. No significant canal stenosis. No additional fracture identified. Trace L4-5 facet effusions, likely degenerative. Conus medullaris and cauda equina: Conus extends to the level. Conus and cauda equina appear normal. Paraspinal and other soft tissues: Negative. Disc levels: L1-2: No significant disc displacement, foraminal stenosis, or canal stenosis. L2-3: Small disc bulge. No significant foraminal or canal stenosis. L3-4: Small disc bulge. No significant foraminal or canal stenosis. L4-5: Minimal anterolisthesis with small disc bulge, facet, and ligamentum flavum hypertrophy. Mild bilateral foraminal. No significant canal stenosis. L5-S1: Small disc bulge and moderate facet hypertrophy. No significant foraminal or canal stenosis. IMPRESSION: 1. Interval L2 superior endplate fracture with 27% central loss of vertebral body height and mild edema indicating recent injury. 2. Stable T11 and L1 compression deformities. L1 kyphoplasty changes. 3. Stable L3-S1 spondylosis greatest at L4-5 and L5-S1. No high-grade foraminal or canal stenosis. 4. L4-5 trace facet effusions, likely degenerative. Electronically Signed   By: Kristine Garbe M.D.   On: 11/05/2017 22:50    ASSESSMENT AND PLAN:  1.Acute L2 compression fracture Stable  continue adult pain protocol, Orthopedic surgery/Dr. Rudene Christians to  see him tomorrow for possible vertebroplasty, and continue close medical monitoring   2.CML,stable Stable Will f/u with hematology-oncologyas outpatient  3.CKD 3 Stable Cr at BL Avoid nephrotoxic agents   4.Hypertension Stable on home regiment  5.  Acute on chronic moderate to severe constipation Resolved Continue current bowel  regiment    All the records are reviewed and case discussed with Care Management/Social Workerr. Management plans discussed with the patient, family and they are in agreement.  CODE STATUS: full  TOTAL TIME TAKING CARE OF THIS PATIENT: 35 minutes.     POSSIBLE D/C IN 1-3 DAYS, DEPENDING ON CLINICAL CONDITION.   Avel Peace Ellora Varnum M.D on 11/07/2017   Between 7am to 6pm - Pager - [phone]  After 6pm go to [email or web address] - password [email or web address] [facility]  Office  [phone]  CC: Primary care physician; [name], MD  Note: This dictation was prepared with Dragon dictation along with smaller phrase technology. Any transcriptional errors that result from this process are unintentional.

## 2017-11-07 NOTE — Progress Notes (Signed)
Subjective: The patient notes that she had an episode of nausea and vomiting this morning but now is feeling okay.  She denies any new complaints pertaining to her back.  She denies any new numbness or paresthesias down her legs to her foot.   Objective: Vital signs in last 24 hours: Temp:  [97.7 F (36.5 C)-98.6 F (37 C)] 98.2 F (36.8 C) (03/17 0921) Pulse Rate:  [66-91] 66 (03/17 0921) Resp:  [17-19] 17 (03/17 0921) BP: (127-165)/(53-78) 145/78 (03/17 0921) SpO2:  [100 %] 100 % (03/17 0921) Weight:  [37 kg (81 lb 9.1 oz)] 37 kg (81 lb 9.1 oz) (03/17 0453)  Intake/Output from previous day: 03/16 0701 - 03/17 0700 In: 360 [P.O.:360] Out: 900 [Urine:900] Intake/Output this shift: No intake/output data recorded.  Recent Labs    11/05/17 2245 11/06/17 0335  HGB 11.2* 10.2*   Recent Labs    11/05/17 2245 11/06/17 0335  WBC 7.7 7.5  RBC 5.79* 5.27*  HCT 36.5 32.9*  PLT 99* 82*   Recent Labs    11/05/17 2245 11/06/17 0335  NA 138 138  K 4.9 4.3  CL 105 105  CO2 23 25  BUN 21* 23*  CREATININE 0.94 0.93  GLUCOSE 113* 113*  CALCIUM 9.9 9.7   No results for input(s): LABPT, INR in the last 72 hours.  Physical Exam: Orthopedic examination findings are unchanged as compared to yesterday.  She remains neurovascularly intact to both lower extremities, other than some decreased sensation to plantar aspects of both feet.  Assessment: Acute superior endplate compression fracture L2.  Plan: I have discussed this case with Dr. Rudene Christians and have explained to him that the patient would like to proceed with a kyphoplasty at L2.  He will review the films and make arrangements to do this procedure in the near future.  Meanwhile, the patient may be mobilized as symptoms permit in her back brace.  Appropriate pain medication may be provided as indicated clinically.   Marshall Cork Maytte Jacot 11/07/2017, 12:56 PM

## 2017-11-07 NOTE — NC FL2 (Signed)
[place] LEVEL OF CARE SCREENING TOOL     IDENTIFICATION  Patient Name: Stephanie Harris Birthdate: 10-21-1930 Sex: female Admission Date (Current Location): 11/05/2017  [place] and [place] Number:  Engineering geologist and Address:  [facility], [address], [place], [place] [address]      Provider Number: [id number]  Attending Physician Name and Address:  Gorden Harms, MD  Relative Name and Phone Number:  Odessia Asleson (Spouse) [phone]    Current Level of Care: Hospital Recommended Level of Care: [place] Prior Approval Number:    Date Approved/Denied:   PASRR Number: [id number]  Discharge Plan: SNF    Current Diagnoses: Patient Active Problem List   Diagnosis Date Noted  . Lumbar compression fracture ([place]) 11/05/2017  . Closed fracture of proximal end of left humerus with routine healing 08/28/2016  . Acute cystitis 04/28/2016  . Chest pain 03/14/2015  . TIA (transient ischemic attack) 01/29/2015  . Allergic arthritis, hand     Orientation RESPIRATION BLADDER Height & Weight     Self, Time, Situation, Place  Normal Continent Weight: 81 lb 9.1 oz (37 kg) Height:  5\' 3"  (160 cm)  BEHAVIORAL SYMPTOMS/MOOD NEUROLOGICAL BOWEL NUTRITION STATUS      Continent    AMBULATORY STATUS COMMUNICATION OF NEEDS Skin   Extensive Assist Verbally Normal                       Personal Care Assistance Level of Assistance  Bathing, Feeding, Dressing Bathing Assistance: Limited assistance Feeding assistance: Independent Dressing Assistance: Limited assistance     Functional Limitations Info             SPECIAL CARE FACTORS FREQUENCY  PT (By licensed PT), OT (By licensed OT)     PT Frequency: Up to 5/week OT Frequency: Up to 5/week            Contractures Contractures Info: Not present    Additional Factors Info  Code Status, Allergies, Psychotropic Code Status Info: Full Allergies  Info:  Latex, Penicillins Psychotropic Info: Trazodone         Current Medications (11/07/2017):  This is the current hospital active medication list Current Facility-Administered Medications  Medication Dose Route Frequency Provider Last Rate Last Dose  . acetaminophen (TYLENOL) tablet 650 mg  650 mg Oral Q6H PRN Amelia Jo, MD   650 mg at 11/06/17 2136   Or  . acetaminophen (TYLENOL) suppository 650 mg  650 mg Rectal Q6H PRN Amelia Jo, MD      . amLODipine (NORVASC) tablet 5 mg  5 mg Oral Daily Amelia Jo, MD   5 mg at 11/07/17 9622  . aspirin EC tablet 81 mg  81 mg Oral Daily Amelia Jo, MD   81 mg at 11/07/17 0919  . bisacodyl (DULCOLAX) EC tablet 5 mg  5 mg Oral Daily PRN Amelia Jo, MD   5 mg at 11/06/17 0148  . bisacodyl (DULCOLAX) suppository 10 mg  10 mg Rectal Once Salary, Montell D, MD      . carvedilol (COREG) tablet 6.25 mg  6.25 mg Oral BID Amelia Jo, MD   6.25 mg at 11/07/17 2979  . cholecalciferol (VITAMIN D) tablet 1,000 Units  1,000 Units Oral Daily Amelia Jo, MD   1,000 Units at 11/07/17 [phone]  . [START ON 11/22/2017] cyanocobalamin ((VITAMIN B-12)) injection 1,000 mcg  1,000 mcg Intramuscular Q30 days Amelia Jo, MD      .  docusate sodium (COLACE) capsule 200 mg  200 mg Oral BID [name] D, MD   200 mg at 11/07/17 0928  . folic acid (FOLVITE) tablet 1 mg  1 mg Oral Daily Amelia Jo, MD   1 mg at 11/07/17 0928  . heparin injection 5,000 Units  5,000 Units Subcutaneous Q8H Amelia Jo, MD   5,000 Units at 11/07/17 [phone]  . HYDROcodone-acetaminophen (NORCO/VICODIN) 5-325 MG per tablet 1-2 tablet  1-2 tablet Oral Q4H PRN Amelia Jo, MD      . lactulose (CHRONULAC) 10 GM/15ML solution 30 g  30 g Oral BID [name] D, MD   30 g at 11/06/17 2129  . levothyroxine (SYNTHROID, LEVOTHROID) tablet 50 mcg  50 mcg Oral QAC breakfast Amelia Jo, MD   50 mcg at 11/07/17 2778  . neomycin-bacitracin-polymyxin (NEOSPORIN) ointment   Topical  Daily Amelia Jo, MD      . ondansetron [facility]) tablet 4 mg  4 mg Oral Q6H PRN Amelia Jo, MD       Or  . ondansetron [facility]) injection 4 mg  4 mg Intravenous Q6H PRN Amelia Jo, MD   4 mg at 11/07/17 0456  . sodium phosphate (FLEET) 7-19 GM/118ML enema 1 enema  1 enema Rectal Once Salary, Montell D, MD      . traZODone (DESYREL) tablet 25 mg  25 mg Oral QHS PRN Amelia Jo, MD      . vitamin C (ASCORBIC ACID) tablet 1,000 mg  1,000 mg Oral Daily Amelia Jo, MD   1,000 mg at 11/07/17 0929  . vitamin E capsule 400 Units  400 Units Oral Daily Amelia Jo, MD   400 Units at 11/07/17 2423     Discharge Medications: Please see discharge summary for a list of discharge medications.  Relevant Imaging Results:  Relevant Lab Results:   Additional Information [phone]  Zettie Pho, LCSW

## 2017-11-07 NOTE — Progress Notes (Signed)
Physical Therapy Treatment Patient Details Name: Stephanie Harris MRN: [id number] DOB: June 11, 1931 Today's Date: 11/07/2017    History of Present Illness Stephanie Harris  is a 84 y.o. female with a known history of chronic myelocytic leukemia, hypertension, osteoarthritis and chronic hemolytic anemia. Patient was brought to emergency room for intractable lower back pain and generalized weakness status post mechanical fall at home.  She lost her balance while walking without a walker in her mobile home.  Patient is currently unable to ambulate on her own due to lower back pain and generalized weakness.  Her husband is older than her and has his own medical problems.  He is not strong enough to help her ambulate and he is worried that they could both fall.  Patient denies any focal weakness; she can move all her extremities; she denies pain or numbness in her lower extremities. MRI of the lumbar spine, done in the emergency room showed interval L2 superior endplate fracture with 84% central loss of vertebral body height and mild edema indicating recent injury.  Other old fractures at thoracic and lumbar spine level are noted to be stable. Pt recently reports she underwent a L1 kyhoplasty. Patient is admitted for further evaluation and pain control. She is awaiting consult by orthopedist.    PT Comments    Pt does well with therapy today. Deferred bed mobility, transfers, and ambulation due to pain. She is able to complete all bed exercises as instructed. Will attempt to mobilize following kyphoplasty tomorrow. Pt will benefit from PT services to address deficits in strength, balance, and mobility in order to return to full function at home.    Follow Up Recommendations  SNF     Equipment Recommendations  None recommended by PT    Recommendations for Other Services       Precautions / Restrictions Precautions Precautions: Fall Restrictions Weight Bearing Restrictions: No Other Position/Activity  Restrictions: Has lumbar corset brace since last Thursday, donned at end of session after husband brings it to the room when he arrives.    Mobility  Bed Mobility               General bed mobility comments: Deferred all mobility today secondary to pain. Bed exercises only  Transfers                    Ambulation/Gait                 Stairs            Wheelchair Mobility    Modified Rankin (Stroke Patients Only)       Balance                                            Cognition Arousal/Alertness: Awake/alert Behavior During Therapy: WFL for tasks assessed/performed Overall Cognitive Status: Within Functional Limits for tasks assessed                                        Exercises General Exercises - Lower Extremity Ankle Circles/Pumps: Both;15 reps Quad Sets: Both;15 reps Gluteal Sets: Both;15 reps Short Arc Quad: Both;10 reps Heel Slides: Both;10 reps Hip ABduction/ADduction: Both;15 reps Straight Leg Raises: Both;10 reps    General Comments  Pertinent Vitals/Pain Pain Assessment: 0-10 Pain Score: 8  Pain Location: Low back pain Pain Descriptors / Indicators: Aching Pain Intervention(s): Limited activity within patient's tolerance;Monitored during session    Home Living                      Prior Function            PT Goals (current goals can now be found in the care plan section) Acute Rehab PT Goals Patient Stated Goal: do rehab and get stronger PT Goal Formulation: With patient/family Time For Goal Achievement: 11/20/17 Potential to Achieve Goals: Good Progress towards PT goals: Progressing toward goals    Frequency    7X/week      PT Plan Current plan remains appropriate    Co-evaluation              AM-PAC PT "6 Clicks" Daily Activity  Outcome Measure  Difficulty turning over in bed (including adjusting bedclothes, sheets and blankets)?:  Unable Difficulty moving from lying on back to sitting on the side of the bed? : Unable Difficulty sitting down on and standing up from a chair with arms (e.g., wheelchair, bedside commode, etc,.)?: Unable Help needed moving to and from a bed to chair (including a wheelchair)?: A Lot Help needed walking in hospital room?: Total Help needed climbing 3-5 steps with a railing? : Total 6 Click Score: 7    End of Session   Activity Tolerance: Patient tolerated treatment well Patient left: in bed;with call bell/phone within reach;with bed alarm set   PT Visit Diagnosis: Unsteadiness on feet (R26.81);Repeated falls (R29.6);Muscle weakness (generalized) (M62.81);Difficulty in walking, not elsewhere classified (R26.2);Pain     Time: 1317-1330 PT Time Calculation (min) (ACUTE ONLY): 13 min  Charges:  $Therapeutic Exercise: 8-22 mins                    G Codes:      Lyndel Safe Danzig Macgregor PT, DPT     Sarye Kath 11/07/2017, 1:31 PM

## 2017-11-07 NOTE — Clinical Social Work Note (Signed)
Clinical Social Work Assessment  Patient Details  Name: Stephanie Harris MRN: [id number] Date of Birth: 06/28/1931  Date of referral:  11/07/17               Reason for consult:  Facility Placement                Permission sought to share information with:  Facility Contact Representative Permission granted to share information::  Yes, Verbal Permission Granted  Name::        Agency::  All [place] area SNFs EXCEPT [facility]  Relationship::     Contact Information:     Housing/Transportation Living arrangements for the past 2 months:  Mobile Home Source of Information:  Patient, Medical Team Patient Interpreter Needed:  None Criminal Activity/Legal Involvement Pertinent to Current Situation/Hospitalization:  No - Comment as needed Significant Relationships:  Adult Children, Church, Community Support, Spouse Lives with:  Spouse, Adult Children Do you feel safe going back to the place where you live?  Yes Need for family participation in patient care:  No (Coment)  Care giving concerns:  PT recommendation for STR   Social Worker assessment / plan:  The CSW met with the patient at bedside. The patient was quite alert and willing to talk about discharge planning. The patient verbalized permission to begin bed search; however, she reported that she does not wish to discharge to [facility] due to past experiences. The CSW explained the referral process and Medicare guidelines for payment. The patient has not had SNF placement since 02/2017 at which time she'd had a significant stroke that required a 7-8 days stay in [facility] ICU.  The patient lives in a mobile home with her spouse and her adult son. The patient feels well supported in the home. The CSW will continue to follow for discharge facilitation and to provide bed offers.  Employment status:  Retired Insurance information:  Medicare PT Recommendations:  Skilled Nursing Facility Information / Referral to community resources:   Skilled Nursing Facility  Patient/Family's Response to care:  The patient thanked the CSW.  Patient/Family's Understanding of and Emotional Response to Diagnosis, Current Treatment, and Prognosis:  The patient is in agreement with the PT recommendation for SNF.  Emotional Assessment Appearance:  Appears older than stated age Attitude/Demeanor/Rapport:  Self-Confident, Charismatic, Gracious, Engaged Affect (typically observed):  Appropriate, Pleasant Orientation:  Oriented to Self, Oriented to Place, Oriented to  Time, Oriented to Situation Alcohol / Substance use:  Never Used Psych involvement (Current and /or in the community):  No (Comment)  Discharge Needs  Concerns to be addressed:  Care Coordination, Discharge Planning Concerns Readmission within the last 30 days:  Yes Current discharge risk:  Chronically ill Barriers to Discharge:  Continued Medical Work up    M , LCSW 11/07/2017, 2:26 PM  

## 2017-11-08 ENCOUNTER — Encounter
Admission: EM | Disposition: A | Discharge status: Skilled Nursing Facility | Payer: Self-pay | Source: Home / Self Care | Attending: Family Medicine

## 2017-11-08 ENCOUNTER — Inpatient Hospital Stay: Payer: Medicare Other

## 2017-11-08 ENCOUNTER — Inpatient Hospital Stay: Payer: Medicare Other | Admitting: Certified Registered Nurse Anesthetist

## 2017-11-08 ENCOUNTER — Telehealth: Payer: Self-pay

## 2017-11-08 DIAGNOSIS — E43 Unspecified severe protein-calorie malnutrition: Secondary | ICD-10-CM

## 2017-11-08 HISTORY — PX: KYPHOPLASTY: SHX5884

## 2017-11-08 LAB — GLUCOSE, CAPILLARY
Glucose-Capillary: 105 mg/dL — ABNORMAL HIGH (ref 65–99)
Glucose-Capillary: 111 mg/dL — ABNORMAL HIGH (ref 65–99)

## 2017-11-08 LAB — PREALBUMIN: Prealbumin: 22.4 mg/dL (ref 18–38)

## 2017-11-08 LAB — SURGICAL PCR SCREEN
MRSA, PCR: NEGATIVE
Staphylococcus aureus: NEGATIVE

## 2017-11-08 SURGERY — KYPHOPLASTY
Anesthesia: General | Wound class: Clean

## 2017-11-08 MED ORDER — FENTANYL CITRATE (PF) 100 MCG/2ML IJ SOLN
INTRAMUSCULAR | Status: DC | PRN
  Administered 2017-11-08 (×2): 25 ug via INTRAVENOUS

## 2017-11-08 MED ORDER — DOCUSATE SODIUM 100 MG PO CAPS
100.0000 mg | ORAL_CAPSULE | Freq: Two times a day (BID) | ORAL | Status: DC
  Administered 2017-11-08 – 2017-11-09 (×2): 100 mg via ORAL
  Filled 2017-11-08 (×2): qty 1

## 2017-11-08 MED ORDER — IOPAMIDOL (ISOVUE-M 200) INJECTION 41%
INTRAMUSCULAR | Status: AC
  Filled 2017-11-08: qty 20

## 2017-11-08 MED ORDER — FENTANYL CITRATE (PF) 100 MCG/2ML IJ SOLN
INTRAMUSCULAR | Status: AC
  Filled 2017-11-08: qty 2

## 2017-11-08 MED ORDER — PROPOFOL 500 MG/50ML IV EMUL
INTRAVENOUS | Status: DC | PRN
  Administered 2017-11-08: 25 ug/kg/min via INTRAVENOUS

## 2017-11-08 MED ORDER — LIDOCAINE HCL (PF) 1 % IJ SOLN
INTRAMUSCULAR | Status: DC | PRN
  Administered 2017-11-08: 30 mL

## 2017-11-08 MED ORDER — MIDAZOLAM HCL 2 MG/2ML IJ SOLN
INTRAMUSCULAR | Status: DC | PRN
  Administered 2017-11-08: 1 mg via INTRAVENOUS

## 2017-11-08 MED ORDER — PROMETHAZINE HCL 25 MG/ML IJ SOLN
INTRAMUSCULAR | Status: AC
  Administered 2017-11-08: 6.25 mg via INTRAVENOUS
  Filled 2017-11-08: qty 1

## 2017-11-08 MED ORDER — ONDANSETRON HCL 4 MG/2ML IJ SOLN: 4.0000 mg | Freq: Four times a day (QID) | INTRAMUSCULAR | Status: DC | PRN

## 2017-11-08 MED ORDER — SODIUM CHLORIDE FLUSH 0.9 % IV SOLN
INTRAVENOUS | Status: AC
  Filled 2017-11-08: qty 10

## 2017-11-08 MED ORDER — MUPIROCIN 2 % EX OINT
1.0000 "application " | TOPICAL_OINTMENT | Freq: Two times a day (BID) | CUTANEOUS | Status: DC
  Filled 2017-11-08: qty 22

## 2017-11-08 MED ORDER — PROMETHAZINE HCL 25 MG/ML IJ SOLN
6.2500 mg | INTRAMUSCULAR | Status: DC | PRN
  Administered 2017-11-08: 6.25 mg via INTRAVENOUS

## 2017-11-08 MED ORDER — HEPARIN SODIUM (PORCINE) 5000 UNIT/ML IJ SOLN
5000.0000 [IU] | Freq: Three times a day (TID) | INTRAMUSCULAR | Status: DC
  Administered 2017-11-08 – 2017-11-09 (×2): 5000 [IU] via SUBCUTANEOUS
  Filled 2017-11-08 (×2): qty 1

## 2017-11-08 MED ORDER — LIDOCAINE HCL (PF) 2 % IJ SOLN
INTRAMUSCULAR | Status: AC
  Filled 2017-11-08: qty 10

## 2017-11-08 MED ORDER — PROPOFOL 500 MG/50ML IV EMUL
INTRAVENOUS | Status: AC
  Filled 2017-11-08: qty 50

## 2017-11-08 MED ORDER — MIDAZOLAM HCL 2 MG/2ML IJ SOLN
INTRAMUSCULAR | Status: AC
  Filled 2017-11-08: qty 2

## 2017-11-08 MED ORDER — FENTANYL CITRATE (PF) 100 MCG/2ML IJ SOLN: 25.0000 ug | INTRAMUSCULAR | Status: DC | PRN

## 2017-11-08 MED ORDER — CLINDAMYCIN PHOSPHATE 300 MG/50ML IV SOLN
300.0000 mg | Freq: Once | INTRAVENOUS | Status: AC
  Administered 2017-11-08: 300 mg via INTRAVENOUS
  Filled 2017-11-08: qty 50

## 2017-11-08 MED ORDER — LACTATED RINGERS IV SOLN
INTRAVENOUS | Status: DC
  Administered 2017-11-08 – 2017-11-09 (×2): via INTRAVENOUS

## 2017-11-08 MED ORDER — LIDOCAINE HCL (PF) 1 % IJ SOLN
INTRAMUSCULAR | Status: AC
  Filled 2017-11-08: qty 60

## 2017-11-08 MED ORDER — ONDANSETRON HCL 4 MG PO TABS: 4.0000 mg | ORAL_TABLET | Freq: Four times a day (QID) | ORAL | Status: DC | PRN

## 2017-11-08 MED ORDER — BUPIVACAINE-EPINEPHRINE (PF) 0.5% -1:200000 IJ SOLN
INTRAMUSCULAR | Status: DC | PRN
  Administered 2017-11-08: 30 mL

## 2017-11-08 MED ORDER — BUPIVACAINE-EPINEPHRINE (PF) 0.5% -1:200000 IJ SOLN
INTRAMUSCULAR | Status: AC
  Filled 2017-11-08: qty 30

## 2017-11-08 SURGICAL SUPPLY — 17 items
ADH SKN CLS APL DERMABOND .7 (GAUZE/BANDAGES/DRESSINGS) ×1
CEMENT BONE KYPHON CDS (Cement) ×1 IMPLANT
DERMABOND ADVANCED (GAUZE/BANDAGES/DRESSINGS) ×1
DERMABOND ADVANCED .7 DNX12 (GAUZE/BANDAGES/DRESSINGS) ×1 IMPLANT
DEVICE BIOPSY BONE KYPHX (INSTRUMENTS) ×2 IMPLANT
DRAPE C-ARM XRAY 36X54 (DRAPES) ×2 IMPLANT
DURAPREP 26ML APPLICATOR (WOUND CARE) ×2 IMPLANT
GLOVE SURG SYN 9.0  PF PI (GLOVE) ×1
GLOVE SURG SYN 9.0 PF PI (GLOVE) ×1 IMPLANT
GOWN SRG 2XL LVL 4 RGLN SLV (GOWNS) ×1 IMPLANT
GOWN STRL NON-REIN 2XL LVL4 (GOWNS) ×2
GOWN STRL REUS W/ TWL LRG LVL3 (GOWN DISPOSABLE) ×1 IMPLANT
GOWN STRL REUS W/TWL LRG LVL3 (GOWN DISPOSABLE) ×2
PACK KYPHOPLASTY (MISCELLANEOUS) ×2 IMPLANT
STRAP SAFETY 5IN WIDE (MISCELLANEOUS) ×2 IMPLANT
TRAY KYPHOPAK 15/3 EXPRESS 1ST (MISCELLANEOUS) ×2 IMPLANT
TRAY KYPHOPAK 20/3 EXPRESS 1ST (MISCELLANEOUS) ×1 IMPLANT

## 2017-11-08 NOTE — Progress Notes (Signed)
Patient with significant pan at L2.  Discussed surgery.  She would like surgery ASAP.

## 2017-11-08 NOTE — Progress Notes (Signed)
OT Cancellation Note  Patient Details Name: Stephanie Harris MRN: [id number] DOB: [birth year]   Cancelled Treatment:    Reason Eval/Treat Not Completed: Other (comment). Chart reviewed. Spoke with RN. Pt going into surgery soon. Will hold OT at this time and re-attempt as appropriate afterwards once pt returns from OR. Will need new OT orders or continue at transfer for current OT order, per protocol. Will continue to follow acutely.   Jeni Salles, MPH, MS, OTR/L ascom [phone] 11/08/17, 9:17 AM

## 2017-11-08 NOTE — Clinical Social Work Placement (Signed)
   CLINICAL SOCIAL WORK PLACEMENT  NOTE  Date:  11/08/2017  Patient Details  Name: Stephanie Harris MRN: [id number] Date of Birth: [birth year]  Clinical Social Work is seeking post-discharge placement for this patient at the [place] level of care (*CSW will initial, date and re-position this form in  chart as items are completed):  Yes   Patient/family provided with [facility] Work Department's list of facilities offering this level of care within the geographic area requested by the patient (or if unable, by the patient's family).  Yes   Patient/family informed of their freedom to choose among providers that offer the needed level of care, that participate in Medicare, Medicaid or managed care program needed by the patient, have an available bed and are willing to accept the patient.  Yes   Patient/family informed of [facility]'s ownership interest in [facility] and [facility], as well as of the fact that they are under no obligation to receive care at these facilities.  PASRR submitted to EDS on       PASRR number received on       Existing PASRR number confirmed on 11/07/17     FL2 transmitted to all facilities in geographic area requested by pt/family on 11/07/17     FL2 transmitted to all facilities within larger geographic area on       Patient informed that his/her managed care company has contracts with or will negotiate with certain facilities, including the following:        Yes   Patient/family informed of bed offers received.  Patient chooses bed at [facility] )     Physician recommends and patient chooses bed at      Patient to be transferred to   on  .  Patient to be transferred to facility by       Patient family notified on   of transfer.  Name of family member notified:        PHYSICIAN       Additional Comment:    _______________________________________________ Jolin Benavides, Veronia Beets, LCSW 11/08/2017, 1:57  PM

## 2017-11-08 NOTE — Progress Notes (Signed)
PT Cancellation Note  Patient Details Name: Stephanie Harris MRN: [id number] DOB: 12/23/1930   Cancelled Treatment:    Reason Eval/Treat Not Completed: Patient's level of consciousness. Attempted to re-evaluate patient at 2:00 and then 3:30 however pt is extremely lethargic, unable to keep her eyes open, and unable to follow commands. Will attempt PT re-evaluation at later date as pt is able to participate.  Lyndel Safe Jameil Whitmoyer PT, DPT   Dmarion Perfect 11/08/2017, 3:31 PM

## 2017-11-08 NOTE — Anesthesia Preprocedure Evaluation (Signed)
Anesthesia Evaluation  Patient identified by MRN, date of birth, ID band Patient awake    Reviewed: Allergy & Precautions, H&P , NPO status , reviewed documented beta blocker date and time   Airway Mallampati: II  TM Distance: >3 FB     Dental  (+) Edentulous Lower, Edentulous Upper   Pulmonary    Pulmonary exam normal        Cardiovascular hypertension, Normal cardiovascular exam     Neuro/Psych TIACVA    GI/Hepatic   Endo/Other    Renal/GU      Musculoskeletal  (+) Arthritis , Osteoarthritis,    Abdominal   Peds  Hematology  (+) anemia ,   Anesthesia Other Findings   Reproductive/Obstetrics                             Anesthesia Physical Anesthesia Plan  ASA: III  Anesthesia Plan: General   Post-op Pain Management:    Induction:   PONV Risk Score and Plan: 3 and Propofol infusion and Ondansetron  Airway Management Planned:   Additional Equipment:   Intra-op Plan:   Post-operative Plan:   Informed Consent: I have reviewed the patients History and Physical, chart, labs and discussed the procedure including the risks, benefits and alternatives for the proposed anesthesia with the patient or authorized representative who has indicated his/her understanding and acceptance.   Dental Advisory Given  Plan Discussed with: CRNA  Anesthesia Plan Comments:         Anesthesia Quick Evaluation

## 2017-11-08 NOTE — Progress Notes (Signed)
Initial Nutrition Assessment  DOCUMENTATION CODES:   Underweight, Severe malnutrition in context of chronic illness  INTERVENTION:  - Magic cup TID with meals, each supplement provides 290 kcal and 9 grams of protein  - Encourage PO intake  - In addition to current vitamin regimen, RD recommends providing 100 mg oral thiamine supplementation daily for at least 3 days  Monitor magnesium, potassium, and phosphorus daily for at least 3 days, MD to replete as needed, as pt is at risk for refeeding syndrome given severe protein-calorie malnutrition, significant recent weight loss (22% in 3 months) and inadequate PO intake PTA.  NUTRITION DIAGNOSIS:   Severe Malnutrition related to chronic illness (chronic myelocytic leukemia) as evidenced by percent weight loss, severe muscle depletion, severe fat depletion (22% weight loss in 3 months).  GOAL:   Patient will meet greater than or equal to 90% of their needs  MONITOR:   PO intake, Supplement acceptance, Weight trends, I & O's  REASON FOR ASSESSMENT:   Consult, Other (Comment)(low BMI) Assessment of nutrition requirement/status  ASSESSMENT:   84 year old female with PMH significant for chronic myelocytic leukemia, HTN, CKD stage III, and rheumatoid arthritis. Pt presented to ED with low back pain after a fall. Pt found to have L2 fracture with compression deformity on MRI.  Spoke with pt at bedside who is awaiting surgical repair of L2 fracture. When asked about her appetite, pt states, "I am eating." Pt reports eating 2 meals daily and having 2 eggs daily. Pt states a meal might consist of grits with salt and butter. Pt reports not liking the food she has been served here. RD noted meal completion: 50-75%. Pt drinks water, Pepsi, orange juice, and Gatorade at home. Pt occasionally cooks at home while sitting in a chair but states that her son and husband do the majority of cooking and grocery shopping.  Pt does not like Ensure or  Boost oral nutrition supplements but is agreeable to trying Magic Cup with each meal once her diet is advanced.  Pt reports her UBW as 110 lbs. Pt unsure when she last weighted this. RD noted 22 lb weight loss in 3 months documented in pt's chart. This is a 21% weight loss which is significant for timeframe.  Medications reviewed and include: 10 mg Dulcolax, 1000 units vitamin D, 1000 mcg Vitamin B-12 injection every 30 days, 200 mg Colace BID, 1 mg folic acid daily, 30 mg lactulose BID, 50 mcg levothyroxine daily, 1000 mg vitamin C daily, 400 units vitamin E daily, IV antibiotics  Labs reviewed: BUN 23, hemoglobin 10.2 CBGs: 105, 109  NUTRITION - FOCUSED PHYSICAL EXAM:    Most Recent Value  Orbital Region  Moderate depletion  Upper Arm Region  Moderate depletion  Thoracic and Lumbar Region  Severe depletion  Buccal Region  Severe depletion  Temple Region  Severe depletion  Clavicle Bone Region  Moderate depletion  Clavicle and Acromion Bone Region  Severe depletion  Scapular Bone Region  Unable to assess  Dorsal Hand  Moderate depletion  Patellar Region  Severe depletion  Anterior Thigh Region  Moderate depletion  Posterior Calf Region  Severe depletion  Edema (RD Assessment)  None  Hair  Reviewed  Eyes  Reviewed  Mouth  Reviewed  Skin  Reviewed  Nails  Reviewed       Diet Order:  Diet NPO time specified  EDUCATION NEEDS:   No education needs have been identified at this time  Skin:  Skin Assessment: Reviewed RN Assessment  Last BM:  11/06/17 medium type 4 (pt with hx of constipation)  Height:   Ht Readings from Last 1 Encounters:  11/08/17 5\' 3"  (1.6 m)    Weight:   Wt Readings from Last 1 Encounters:  11/08/17 82 lb 14.3 oz (37.6 kg)    Ideal Body Weight:  52.3 kg  BMI:  Body mass index is 14.68 kg/m.  Estimated Nutritional Needs:   Kcal:  1150-1350 kcal/day  Protein:  45-55 grams/day  Fluid:  1.1-1.3 L/day    Gaynell Face, MS, RD,  LDN Pager: [phone] Weekend/After Hours: [phone]

## 2017-11-08 NOTE — Anesthesia Postprocedure Evaluation (Signed)
Anesthesia Post Note  Patient: Stephanie Harris  Procedure(s) Performed: Hewitt Shorts (N/A )  Patient location during evaluation: PACU Anesthesia Type: General Level of consciousness: awake and alert Pain management: pain level controlled Vital Signs Assessment: post-procedure vital signs reviewed and stable Respiratory status: spontaneous breathing, nonlabored ventilation and respiratory function stable Cardiovascular status: blood pressure returned to baseline and stable Postop Assessment: no apparent nausea or vomiting Anesthetic complications: no     Last Vitals:  Vitals:   11/08/17 1429 11/08/17 1439  BP: 121/88 (!) 142/49  Pulse: 75 72  Resp: 16 18  Temp: (!) 36.2 C 36.4 C  SpO2: 95% 97%    Last Pain:  Vitals:   11/08/17 1439  TempSrc: Oral  PainSc:                  Alphonsus Sias

## 2017-11-08 NOTE — Progress Notes (Signed)
Clinical Social Worker (CSW) met with patient's husband Herman at bedside. Patient was asleep. CSW presented bed offers. Husband chose [facility]. Michelle admissions coordinator at [facility] is aware of above.    , LCSW [phone]  

## 2017-11-08 NOTE — Progress Notes (Signed)
RT attempted to do IS with patient but she was too groggy to do. Will re-attempt once patient is more alert.

## 2017-11-08 NOTE — Anesthesia Post-op Follow-up Note (Signed)
Anesthesia QCDR form completed.        

## 2017-11-08 NOTE — Op Note (Signed)
11/08/2017  12:26 PM  PATIENT:  Stephanie Harris  84 y.o. female  PRE-OPERATIVE DIAGNOSIS:  n/a L2 compression fracture  POST-OPERATIVE DIAGNOSIS:  n/a L2 compression fracture  PROCEDURE:  Procedure(s): KYPHOPLASTY-L2 (N/A)  SURGEON: Laurene Footman, MD  ASSISTANTS: None  ANESTHESIA:   local and MAC  EBL:  No intake/output data recorded.  BLOOD ADMINISTERED:none  DRAINS: none   LOCAL MEDICATIONS USED:  MARCAINE    and XYLOCAINE   SPECIMEN:  Source of Specimen:  L2 vertebral body  DISPOSITION OF SPECIMEN:  PATHOLOGY  COUNTS:  YES  TOURNIQUET:  * No tourniquets in log *  IMPLANTS: Bone cement  DICTATION: .Dragon Dictation patient was brought to the operating room and after adequate anesthesia was obtained the patient was placed prone.  Serum was brought in in good visualization of L2 was obtained in both AP and lateral projections.  After appropriate patient identification and timeout procedures were completed, local anesthetic was infiltrated on the right side at L2 down to the subcutaneous tissue.  The back was then prepped and draped in sterile fashion and repeat timeout procedure carried out.  Spinal needle was used to get local anesthetic down to the right side pedicle and in the subcutaneous tissue.  After allowing this to set a small incision was made and a trocar was advanced in extrapedicular approach into the vertebral body where a biopsy was obtained.  Care was taken on advancing the trocar that the trocar was stay lateral to the medial wall of the pedicle until it was in the vertebral body.  Drilling was carried out followed by placement of the balloon and inflation to about 3 cc.  The balloon was deflated with the cement was the appropriate consistency and 4 cc were used to fill the vertebral body with good interdigitation and coverage from right to left side superior to inferior endplate there was no extravasation.  When the cement had set the trocar was removed and  permanent C-arm views obtained.  Dermabond was used to close the skin and when it was dry a Band-Aid was applied patient was transferred to recovery in stable condition  PLAN OF CARE: Continue as inpatient  PATIENT DISPOSITION:  PACU - hemodynamically stable.

## 2017-11-08 NOTE — Progress Notes (Signed)
[facility] [place] NAME: Stephanie Harris    MR#:  [id number]  DATE OF BIRTH:  [birth year]  SUBJECTIVE:  CHIEF COMPLAINT:   Chief Complaint  Patient presents with  . Back Pain  Patient without complaint, for vertebroplasty later today by orthopedic surgery  REVIEW OF SYSTEMS:  CONSTITUTIONAL: No fever, fatigue or weakness.  EYES: No blurred or double vision.  EARS, NOSE, AND THROAT: No tinnitus or ear pain.  RESPIRATORY: No cough, shortness of breath, wheezing or hemoptysis.  CARDIOVASCULAR: No chest pain, orthopnea, edema.  GASTROINTESTINAL: No nausea, vomiting, diarrhea or abdominal pain.  GENITOURINARY: No dysuria, hematuria.  ENDOCRINE: No polyuria, nocturia,  HEMATOLOGY: No anemia, easy bruising or bleeding SKIN: No rash or lesion. MUSCULOSKELETAL: No joint pain or arthritis.   NEUROLOGIC: No tingling, numbness, weakness.  PSYCHIATRY: No anxiety or depression.   ROS  DRUG ALLERGIES:   Allergies  Allergen Reactions  . Latex Rash  . Penicillins Other (See Comments)    Has patient had a PCN reaction causing immediate rash, facial/tongue/throat swelling, SOB or lightheadedness with hypotension: Unknown Has patient had a PCN reaction causing severe rash involving mucus membranes or skin necrosis: Unknown Has patient had a PCN reaction that required hospitalization: Unknown Has patient had a PCN reaction occurring within the last 10 years: Unknown If all of the above answers are "NO", then may proceed with Cephalosporin use.     VITALS:  Blood pressure (!) 135/52, pulse 60, temperature (!) 96 F (35.6 C), temperature source Tympanic, resp. rate 17, height 5\' 3"  (1.6 m), weight 37.6 kg (82 lb 14.3 oz), SpO2 100 %.  PHYSICAL EXAMINATION:  GENERAL:  84 y.o.-year-old patient lying in the bed with no acute distress.  EYES: Pupils equal, round, reactive to light and accommodation. No scleral icterus. Extraocular muscles intact.   HEENT: Head atraumatic, normocephalic. Oropharynx and nasopharynx clear.  NECK:  Supple, no jugular venous distention. No thyroid enlargement, no tenderness.  LUNGS: Normal breath sounds bilaterally, no wheezing, rales,rhonchi or crepitation. No use of accessory muscles of respiration.  CARDIOVASCULAR: S1, S2 normal. No murmurs, rubs, or gallops.  ABDOMEN: Soft, nontender, nondistended. Bowel sounds present. No organomegaly or mass.  EXTREMITIES: No pedal edema, cyanosis, or clubbing.  NEUROLOGIC: Cranial nerves II through XII are intact. Muscle strength 5/5 in all extremities. Sensation intact. Gait not checked.  PSYCHIATRIC: The patient is alert and oriented x 3.  SKIN: No obvious rash, lesion, or ulcer.   Physical Exam LABORATORY PANEL:   CBC Recent Labs  Lab 11/06/17 0335  WBC 7.5  HGB 10.2*  HCT 32.9*  PLT 82*   ------------------------------------------------------------------------------------------------------------------  Chemistries  Recent Labs  Lab 11/06/17 0335  NA 138  K 4.3  CL 105  CO2 25  GLUCOSE 113*  BUN 23*  CREATININE 0.93  CALCIUM 9.7   ------------------------------------------------------------------------------------------------------------------  Cardiac Enzymes No results for input(s): TROPONINI in the last 168 hours. ------------------------------------------------------------------------------------------------------------------  RADIOLOGY:  No results found.  ASSESSMENT AND PLAN:  1.Acute L2 compression fracture Stable  continue adult pain protocol, for vertebroplasty by orthopedic surgery/Dr. Rudene Christians later today, continue adult pain protocol  2.CML,stable Stable Will f/u with hematology-oncologyas outpatient  3.CKD 3 Stable Cr at BL Avoid nephrotoxic agents  4.Hypertension Stable on home regiment  5.Acute on chronic moderate to severe constipation Resolved Continue current bowel regiment  6.  Acute on  chronic severe protein energy/calorie malnutrition Dietary consult while in house   All the records are reviewed and  case discussed with Care Management/Social Workerr. Management plans discussed with the patient, family and they are in agreement.  CODE STATUS: full  TOTAL TIME TAKING CARE OF THIS PATIENT: 35 minutes.     POSSIBLE D/C IN 1-2 DAYS, DEPENDING ON CLINICAL CONDITION.   Avel Peace Yuvraj Pfeifer M.D on 11/08/2017   Between 7am to 6pm - Pager - [phone]  After 6pm go to [email or web address] - password [email or web address] [facility]  Office  [phone]  CC: Primary care physician; [name], MD  Note: This dictation was prepared with Dragon dictation along with smaller phrase technology. Any transcriptional errors that result from this process are unintentional.

## 2017-11-08 NOTE — Telephone Encounter (Signed)
Pt called and left vm that she was at [facility] in [place] and was having back surgery.  dbs

## 2017-11-08 NOTE — Progress Notes (Signed)
Pt in no distress at this time. VSS. Pt is very drowsy but awakens to voice. Too groggy to eat at this time. Pulses intact and can move all extremities. Denies pain. Will continue to monitor.

## 2017-11-08 NOTE — Transfer of Care (Signed)
Immediate Anesthesia Transfer of Care Note  Patient: Stephanie Harris  Procedure(s) Performed: Hewitt Shorts (N/A )  Patient Location: PACU  Anesthesia Type:General  Level of Consciousness: sedated  Airway & Oxygen Therapy: Patient connected to nasal cannula oxygen  Post-op Assessment: Post -op Vital signs reviewed and stable  Post vital signs: stable  Last Vitals:  Vitals:   11/08/17 1036 11/08/17 1215  BP: (!) 135/52 (!) 129/55  Pulse: 60 64  Resp: 17   Temp: (!) 35.6 C (!) 36.2 C  SpO2: 100% 100%    Last Pain:  Vitals:   11/08/17 1215  TempSrc: Temporal  PainSc:       Patients Stated Pain Goal: 2 (59/74/16 3845)  Complications: No apparent anesthesia complications

## 2017-11-09 ENCOUNTER — Encounter
Admission: RE | Admit: 2017-11-09 | Discharge: 2017-11-09 | Disposition: A | Discharge status: Home / Self Care | Payer: Medicare Other | Source: Ambulatory Visit | Attending: Internal Medicine | Admitting: Internal Medicine

## 2017-11-09 ENCOUNTER — Encounter: Payer: Self-pay | Admitting: Orthopedic Surgery

## 2017-11-09 DIAGNOSIS — D563 Thalassemia minor: Secondary | ICD-10-CM | POA: Diagnosis not present

## 2017-11-09 DIAGNOSIS — R262 Difficulty in walking, not elsewhere classified: Secondary | ICD-10-CM | POA: Diagnosis not present

## 2017-11-09 DIAGNOSIS — Z7982 Long term (current) use of aspirin: Secondary | ICD-10-CM | POA: Diagnosis not present

## 2017-11-09 DIAGNOSIS — N183 Chronic kidney disease, stage 3 (moderate): Secondary | ICD-10-CM | POA: Diagnosis not present

## 2017-11-09 DIAGNOSIS — M6281 Muscle weakness (generalized): Secondary | ICD-10-CM | POA: Diagnosis not present

## 2017-11-09 DIAGNOSIS — I129 Hypertensive chronic kidney disease with stage 1 through stage 4 chronic kidney disease, or unspecified chronic kidney disease: Secondary | ICD-10-CM | POA: Diagnosis not present

## 2017-11-09 DIAGNOSIS — Z7984 Long term (current) use of oral hypoglycemic drugs: Secondary | ICD-10-CM | POA: Diagnosis not present

## 2017-11-09 DIAGNOSIS — M8000XA Age-related osteoporosis with current pathological fracture, unspecified site, initial encounter for fracture: Secondary | ICD-10-CM | POA: Diagnosis not present

## 2017-11-09 DIAGNOSIS — S32020D Wedge compression fracture of second lumbar vertebra, subsequent encounter for fracture with routine healing: Secondary | ICD-10-CM | POA: Diagnosis not present

## 2017-11-09 DIAGNOSIS — E43 Unspecified severe protein-calorie malnutrition: Secondary | ICD-10-CM | POA: Diagnosis not present

## 2017-11-09 DIAGNOSIS — D631 Anemia in chronic kidney disease: Secondary | ICD-10-CM | POA: Diagnosis not present

## 2017-11-09 DIAGNOSIS — E1122 Type 2 diabetes mellitus with diabetic chronic kidney disease: Secondary | ICD-10-CM | POA: Diagnosis not present

## 2017-11-09 DIAGNOSIS — M549 Dorsalgia, unspecified: Secondary | ICD-10-CM | POA: Diagnosis not present

## 2017-11-09 DIAGNOSIS — M4850XA Collapsed vertebra, not elsewhere classified, site unspecified, initial encounter for fracture: Secondary | ICD-10-CM | POA: Diagnosis not present

## 2017-11-09 DIAGNOSIS — Z7401 Bed confinement status: Secondary | ICD-10-CM | POA: Diagnosis not present

## 2017-11-09 DIAGNOSIS — M8008XD Age-related osteoporosis with current pathological fracture, vertebra(e), subsequent encounter for fracture with routine healing: Secondary | ICD-10-CM | POA: Diagnosis not present

## 2017-11-09 DIAGNOSIS — C9210 Chronic myeloid leukemia, BCR/ABL-positive, not having achieved remission: Secondary | ICD-10-CM | POA: Diagnosis not present

## 2017-11-09 DIAGNOSIS — I779 Disorder of arteries and arterioles, unspecified: Secondary | ICD-10-CM | POA: Diagnosis not present

## 2017-11-09 DIAGNOSIS — E039 Hypothyroidism, unspecified: Secondary | ICD-10-CM | POA: Diagnosis not present

## 2017-11-09 DIAGNOSIS — I10 Essential (primary) hypertension: Secondary | ICD-10-CM | POA: Diagnosis not present

## 2017-11-09 DIAGNOSIS — Z9181 History of falling: Secondary | ICD-10-CM | POA: Diagnosis not present

## 2017-11-09 DIAGNOSIS — C9110 Chronic lymphocytic leukemia of B-cell type not having achieved remission: Secondary | ICD-10-CM | POA: Diagnosis not present

## 2017-11-09 LAB — GLUCOSE, CAPILLARY: Glucose-Capillary: 85 mg/dL (ref 65–99)

## 2017-11-09 MED ORDER — OXYCODONE-ACETAMINOPHEN 5-325 MG PO TABS: 1.0000 | ORAL_TABLET | Freq: Four times a day (QID) | ORAL | 0 refills | Status: DC | PRN

## 2017-11-09 NOTE — Discharge Instructions (Signed)
Diet: As you were doing prior to hospitalization   Shower:  May shower but  NO SOAKING IN TUB.     Activity:  Increase activity slowly as tolerated. Avoid heavy lifting and bending  Weight Bearing:   Weight bearing as tolerated to  To prevent constipation: you may use a stool softener such as -  Colace (over the counter) 100 mg by mouth twice a day  Drink plenty of fluids (prune juice may be helpful) and high fiber foods Miralax (over the counter) for constipation as needed.    Itching:  If you experience itching with your medications, try taking only a single pain pill, or even half a pain pill at a time.  You may take up to 10 pain pills per day, and you can also use benadryl over the counter for itching or also to help with sleep.   Precautions:  If you experience chest pain or shortness of breath - call 911 immediately for transfer to the hospital emergency department!!  If you develop a fever greater that 101 F, purulent drainage from wound, increased redness or drainage from wound, or calf pain-Call [facility]                                               Follow- Up Appointment:  Please call for an appointment to be seen in 2 weeks at [facility]

## 2017-11-09 NOTE — Progress Notes (Signed)
OT Cancellation Note  Patient Details Name: Stephanie Harris MRN: [id number] DOB: October 13, 1930   Cancelled Treatment:    Reason Eval/Treat Not Completed: Other (comment). Pt in recliner with visitors and RN in room for assessment. Will re-attempt at later time as pt is available.  Jeni Salles, MPH, MS, OTR/L ascom [phone] 11/09/17, 11:12 AM

## 2017-11-09 NOTE — Evaluation (Signed)
Physical Therapy Evaluation Patient Details Name: Stephanie Harris MRN: [id number] DOB: Nov 25, 1930 Today's Date: 11/09/2017   History of Present Illness  Pt is an 84 y.o. female with a known history of chronic myelocytic leukemia, hypertension, osteoarthritis and chronic hemolytic anemia. Patient was brought to emergency room for intractable lower back pain and generalized weakness status post mechanical fall at home. She lost her balance while walking without a walker in her mobile home. Her husband is older than her and has his own medical problems. He is not strong enough to help her ambulate and he is worried that they could both fall. Patient denies any focal weakness; she can move all her extremities; she denies pain or numbness in her lower extremities. MRI of the lumbar spine, done in the emergency room showed interval L2 superior endplate fracture with 62% central loss of vertebral body height and mild edema indicating recent injury. Pt is now s/p kyphoplasty to L2.  Other old fractures at thoracic and lumbar spine level are noted to be stable.  Assessment includes: L2 compression fracture s/p kyphoplasty, stable CML, CKD III, HTN, and acute on chronic protein calorie malnutrition.     Clinical Impression  Pt presents with deficits in strength, transfers, mobility, gait, balance, and activity tolerance.  Pt reports improvement in LBP s/p kyphoplasty and was able to amb this session although very limited distances.  Pt education provided on log roll technique during bed mobility tasks and required Mod A for BLEs in and out of bed along with upper body assistance to full upright position.  Lumbar corset brace donned with total assist prior to transfers/amb.  Pt required extensive verbal cues for sequencing during transfers to ensure good eccentric control during stand to sit and to minimize assistance required.  Pt ambulated around 5' with RW with flexed trunk posture and very short, shuffling B  steps.  Pt was very fearful during ambulation that she was going to fall but overall presented with fair stability with no LOB noted.  Pt did rely heavily on BUE support on the RW however.  Pt will benefit from PT services in a SNF setting upon discharge to safely address above deficits for decreased caregiver assistance and eventual return to PLOF.      Follow Up Recommendations SNF    Equipment Recommendations  None recommended by PT    Recommendations for Other Services       Precautions / Restrictions Precautions Precautions: Fall Restrictions Weight Bearing Restrictions: Yes RLE Weight Bearing: Weight bearing as tolerated LLE Weight Bearing: Weight bearing as tolerated Other Position/Activity Restrictions: Has lumbar corset brace donned during session      Mobility  Bed Mobility Overal bed mobility: Needs Assistance Bed Mobility: Rolling;Supine to Sit;Sit to Supine Rolling: Min guard   Supine to sit: Mod assist Sit to supine: Mod assist   General bed mobility comments: Log roll technique education provided with practice and mod verbal cues for sequencing  Transfers Overall transfer level: Needs assistance Equipment used: Rolling walker (2 wheeled) Transfers: Sit to/from Stand Sit to Stand: Mod assist         General transfer comment: Extensive verbal cues for sequencing provided  Ambulation/Gait Ambulation/Gait assistance: Min assist Ambulation Distance (Feet): 5 Feet Assistive device: Rolling walker (2 wheeled) Gait Pattern/deviations: Step-through pattern;Decreased step length - right;Decreased step length - left;Trunk flexed;Shuffle   Gait velocity interpretation: <1.8 ft/sec, indicative of risk for recurrent falls General Gait Details: Very slow cadence and short, shuffling B steps with  amb  Stairs            Wheelchair Mobility    Modified Rankin (Stroke Patients Only)       Balance Overall balance assessment: Needs assistance;History of  Falls Sitting-balance support: No upper extremity supported;Feet supported Sitting balance-Leahy Scale: Good     Standing balance support: Bilateral upper extremity supported Standing balance-Leahy Scale: Fair Standing balance comment: Heavy reliance on BUEs on RW in standing and fearful of falling                             Pertinent Vitals/Pain Pain Assessment: 0-10 Pain Score: 1  Pain Location: Low back pain Pain Descriptors / Indicators: Sore Pain Intervention(s): Monitored during session;Limited activity within patient's tolerance    Home Living Family/patient expects to be discharged to:: Private residence Living Arrangements: Spouse/significant other;Children Available Help at Discharge: Available PRN/intermittently Type of Home: Mobile home Home Access: Ramped entrance     Home Layout: One level Home Equipment: Environmental consultant - 4 wheels;Shower seat;Bedside commode;Cane - single point;Walker - 2 wheels Additional Comments: Uses BSC over toilet at home    Prior Function     Gait / Transfers Assistance Needed: Pt ambulates with rollator. Reports at least 6 falls in the last 12 months   ADL's / Homemaking Assistance Needed: Pt requires assist for ADLs/IADLs - spouse drives, assists with tub transfers, PRN assist for donning shoes, does most household chores  Comments: Mod indep for limited household mobility with 4WRW; husband assists with ADLs, household chores as needed.     Hand Dominance   Dominant Hand: Right    Extremity/Trunk Assessment   Upper Extremity Assessment LUE Deficits / Details: L shoulder weakness/pain which is chronic    Lower Extremity Assessment Lower Extremity Assessment: Generalized weakness    Cervical / Trunk Assessment Cervical / Trunk Assessment: Kyphotic  Communication   Communication: No difficulties  Cognition Arousal/Alertness: Awake/alert Behavior During Therapy: WFL for tasks assessed/performed Overall Cognitive  Status: Within Functional Limits for tasks assessed                                        General Comments      Exercises Total Joint Exercises Ankle Circles/Pumps: AROM;Both;5 reps;10 reps Quad Sets: Strengthening;Both;5 reps;10 reps Gluteal Sets: Strengthening;Both;5 reps;10 reps Heel Slides: AAROM;Both;5 reps Hip ABduction/ADduction: AAROM;Both;5 reps Straight Leg Raises: AAROM;Both;5 reps Long Arc Quad: AROM;Both;10 reps Knee Flexion: AROM;Both;10 reps Marching in Standing: AROM;Both;5 reps Other Exercises Other Exercises: Log roll training with bed mobility Other Exercises: Sit to/from stand transfer training with emphasis on proper sequencing especially during stand to sit for good eccentric control to prevent excess compression to spine upon sitting   Assessment/Plan    PT Assessment Patient needs continued PT services  PT Problem List Decreased strength;Decreased activity tolerance;Decreased balance;Decreased knowledge of use of DME;Decreased mobility       PT Treatment Interventions DME instruction;Gait training;Functional mobility training;Balance training;Therapeutic exercise;Therapeutic activities;Patient/family education    PT Goals (Current goals can be found in the Care Plan section)  Acute Rehab PT Goals Patient Stated Goal: To be strong enough to walk in the community PT Goal Formulation: With patient Time For Goal Achievement: 11/22/17 Potential to Achieve Goals: Good    Frequency 7X/week   Barriers to discharge Decreased caregiver support      Co-evaluation  AM-PAC PT "6 Clicks" Daily Activity  Outcome Measure Difficulty turning over in bed (including adjusting bedclothes, sheets and blankets)?: Unable   Difficulty sitting down on and standing up from a chair with arms (e.g., wheelchair, bedside commode, etc,.)?: Unable Help needed moving to and from a bed to chair (including a wheelchair)?: A Lot Help needed  walking in hospital room?: Total Help needed climbing 3-5 steps with a railing? : Total 6 Click Score: 6    End of Session Equipment Utilized During Treatment: Gait belt;Other (comment)(Lumbar brace) Activity Tolerance: Patient tolerated treatment well Patient left: in chair;with chair alarm set;with SCD's reapplied;with call bell/phone within reach Nurse Communication: Mobility status PT Visit Diagnosis: Other abnormalities of gait and mobility (R26.89);Muscle weakness (generalized) (M62.81);Unsteadiness on feet (R26.81);Repeated falls (R29.6)    Time: 0950-1030 PT Time Calculation (min) (ACUTE ONLY): 40 min   Charges:   PT Evaluation $PT Eval Low Complexity: 1 Low PT Treatments $Therapeutic Exercise: 8-22 mins $Therapeutic Activity: 8-22 mins   PT G Codes:        DRoyetta Asal PT, DPT 11/09/17, 11:07 AM

## 2017-11-09 NOTE — Progress Notes (Signed)
   Subjective: 1 Day Post-Op Procedure(s) (LRB): KYPHOPLASTY-L2 (N/A) Patient reports pain as mild.  States back pain is improved. No PT yesterday Patient is well, and has had no acute complaints or problems We will continue therapy today.  Plan is to go to [facility] today  Objective: Vital signs in last 24 hours: Temp:  [96 F (35.6 C)-98.2 F (36.8 C)] 98.2 F (36.8 C) (03/19 0755) Pulse Rate:  [54-75] 66 (03/19 0755) Resp:  [14-22] 18 (03/19 0435) BP: (110-160)/(44-88) 159/56 (03/19 0755) SpO2:  [94 %-100 %] 100 % (03/19 0755) FiO2 (%):  [21 %] 21 % (03/18 1307) Weight:  [24 kg (53 lb)-37.6 kg (82 lb 14.3 oz)] 24 kg (53 lb) (03/19 0436)  Intake/Output from previous day: 03/18 0701 - 03/19 0700 In: 958.3 [I.V.:958.3] Out: 550 [Urine:550] Intake/Output this shift: No intake/output data recorded.  No results for input(s): HGB in the last 72 hours. No results for input(s): WBC, RBC, HCT, PLT in the last 72 hours. No results for input(s): NA, K, CL, CO2, BUN, CREATININE, GLUCOSE, CALCIUM in the last 72 hours. No results for input(s): LABPT, INR in the last 72 hours.  EXAM General - Patient is Alert, Appropriate and Oriented Extremity - Neurovascular intact Sensation intact distally Compartment soft   Past Medical History:  Diagnosis Date  . CML (chronic myelocytic leukemia) ([place])    Onc at [facility]  . Heart disease   . Hypertension   . Leukemia ([place])   . RA (rheumatoid arthritis) ([place])   . Stroke ([place])   . Thalassemia     Assessment/Plan:   1 Day Post-Op Procedure(s) (LRB): KYPHOPLASTY-L2 (N/A) Active Problems:   Lumbar compression fracture ([facility])   Protein-calorie malnutrition, severe  Estimated body mass index is 9.39 kg/m as calculated from the following:   Height as of this encounter: 5\' 3"  (1.6 m).   Weight as of this encounter: 24 kg (53 lb).  Patient states pain is much improved. Will get patient up with PT today. WBAT BLE.  Patient will be discharging  to SNF Follow up with [facility] ortho in 2 weeks for repeat xrays lumbar spine    T. Rachelle Hora, PA-C [place] 11/09/2017, 9:24 AM

## 2017-11-09 NOTE — Progress Notes (Signed)
Called report to Benjie Karvonen, Therapist, sports at [facility]. Answered all questions.

## 2017-11-09 NOTE — Discharge Summary (Signed)
[facility] NAME: Stephanie Harris    MR#:  [id number]  DATE OF BIRTH:  1930/10/15  DATE OF ADMISSION:  11/05/2017 ADMITTING PHYSICIAN: Amelia Jo, MD  DATE OF DISCHARGE: No discharge date for patient encounter.  PRIMARY CARE PHYSICIAN: [name], MD    ADMISSION DIAGNOSIS:  Ambulatory dysfunction [R26.2] Acute low back pain, unspecified back pain laterality, with sciatica presence unspecified [M54.5] Closed compression fracture of second lumbar vertebra, initial encounter ([place]) [S32.020A]  DISCHARGE DIAGNOSIS:  Active Problems:   Lumbar compression fracture (HCC)   Protein-calorie malnutrition, severe   SECONDARY DIAGNOSIS:   Past Medical History:  Diagnosis Date  . CML (chronic myelocytic leukemia) ([place])    Onc at [facility]  . Heart disease   . Hypertension   . Leukemia ([place])   . RA (rheumatoid arthritis) ([place])   . Stroke ([place])   . Thalassemia     HOSPITAL COURSE:  1.Acute L2 compression fracture Stable  Status post kyphoplasty by orthopedic surgery/Dr. Rudene Christians  November 08, 9321 without complication, treated on our adult pain protocol, for skilled nursing facility status post discharge, physical therapy did see patient while in house  2.CML,stable Stable Will f/u with hematology-oncologyas outpatient  3.CKD 3 Stable Cr at [facility] Avoided nephrotoxic agents  4.Hypertension Stable on home regiment  5.Acute on chronic moderate to severe constipation Resolved Continue current bowel regiment  6.  Acute on chronic severe protein energy/calorie malnutrition Dietary consult while in house  DISCHARGE CONDITIONS:  84 y.o. female with a known history of chronic myelocytic leukemia, hypertension, osteoarthritis and chronic hemolytic anemia. Patient was brought to emergency room for intractable lower back pain and generalized weakness status post mechanical fall, at home yesterday.  She lost her  balance while walking without a walker in her trailer home.  Patient is currently unable to ambulate on her own due to lower back pain and generalized weakness.  Her husband is older than her and has his old medical problems.  He is not strong enough to help her ambulate and he is worried that they could both fall.  Patient denies any focal weakness; she can move all her extremities; she denies pain or numbness in her lower extremities.  MRI of the lumbar spine, done in the emergency room, reviewed by myself, shows interval L2 superior endplate fracture with 55% central loss of vertebral body height and mild edema indicating recent injury.  Other old fractures at thoracic and lumbar spine level are noted to be stable. Patient is admitted for further evaluation and pain control.  CONSULTS OBTAINED:  Treatment Team:  Corky Mull, MD  DRUG ALLERGIES:   Allergies  Allergen Reactions  . Latex Rash  . Penicillins Other (See Comments)    Has patient had a PCN reaction causing immediate rash, facial/tongue/throat swelling, SOB or lightheadedness with hypotension: Unknown Has patient had a PCN reaction causing severe rash involving mucus membranes or skin necrosis: Unknown Has patient had a PCN reaction that required hospitalization: Unknown Has patient had a PCN reaction occurring within the last 10 years: Unknown If all of the above answers are "NO", then may proceed with Cephalosporin use.     DISCHARGE MEDICATIONS:   Allergies as of 11/09/2017      Reactions   Latex Rash   Penicillins Other (See Comments)   Has patient had a PCN reaction causing immediate rash, facial/tongue/throat swelling, SOB or lightheadedness with hypotension: Unknown Has patient had a PCN  reaction causing severe rash involving mucus membranes or skin necrosis: Unknown Has patient had a PCN reaction that required hospitalization: Unknown Has patient had a PCN reaction occurring within the last 10 years: Unknown If  all of the above answers are "NO", then may proceed with Cephalosporin use.      Medication List    TAKE these medications   amLODipine 5 MG tablet Commonly known as:  NORVASC Take 1 tablet (5 mg total) by mouth daily.   ascorbic acid 1000 MG tablet Commonly known as:  VITAMIN C Take 1,000 mg by mouth daily.   aspirin 81 MG EC tablet Take 81 mg by mouth daily.   carvedilol 3.125 MG tablet Commonly known as:  COREG Take 6.25 mg by mouth 2 (two) times daily.   cholecalciferol 1000 units tablet Commonly known as:  VITAMIN D Take 1,000 Units by mouth daily.   cyanocobalamin 1000 MCG/ML injection Commonly known as:  (VITAMIN B-12) Inject 1,000 mcg into the muscle every 30 (thirty) days.   docusate sodium 100 MG capsule Commonly known as:  COLACE Take 1 capsule (100 mg total) by mouth daily as needed. What changed:  reasons to take this   epoetin alfa 10000 UNIT/ML injection Commonly known as:  EPOGEN,PROCRIT Inject 10,000 Units into the skin every 14 (fourteen) days.   glimepiride 1 MG tablet Commonly known as:  AMARYL Take 1 mg by mouth daily.   levothyroxine 50 MCG tablet Commonly known as:  SYNTHROID, LEVOTHROID Take 50 mcg by mouth daily.   ondansetron 4 MG disintegrating tablet Commonly known as:  ZOFRAN ODT Take 1 tablet (4 mg total) by mouth every 8 (eight) hours as needed for nausea or vomiting.   oxyCODONE-acetaminophen 5-325 MG tablet Commonly known as:  PERCOCET/ROXICET Take 1 tablet by mouth every 6 (six) hours as needed for severe pain.   polyethylene glycol packet Commonly known as:  MIRALAX / GLYCOLAX Take 17 g by mouth daily as needed for mild constipation.   vitamin E 400 UNIT capsule Take 400 Units by mouth daily.        DISCHARGE INSTRUCTIONS:   If you experience worsening of your admission symptoms, develop shortness of breath, life threatening emergency, suicidal or homicidal thoughts you must seek medical attention immediately by  calling 911 or calling your MD immediately  if symptoms less severe.  You Must read complete instructions/literature along with all the possible adverse reactions/side effects for all the Medicines you take and that have been prescribed to you. Take any new Medicines after you have completely understood and accept all the possible adverse reactions/side effects.   Please note  You were cared for by a hospitalist during your hospital stay. If you have any questions about your discharge medications or the care you received while you were in the hospital after you are discharged, you can call the unit and asked to speak with the hospitalist on call if the hospitalist that took care of you is not available. Once you are discharged, your primary care physician will handle any further medical issues. Please note that NO REFILLS for any discharge medications will be authorized once you are discharged, as it is imperative that you return to your primary care physician (or establish a relationship with a primary care physician if you do not have one) for your aftercare needs so that they can reassess your need for medications and monitor your lab values.    Today   CHIEF COMPLAINT:   Chief Complaint  Patient presents with  . Back Pain    HISTORY OF PRESENT ILLNESS:  84 y.o. female with a known history of chronic myelocytic leukemia, hypertension, osteoarthritis and chronic hemolytic anemia. Patient was brought to emergency room for intractable lower back pain and generalized weakness status post mechanical fall, at home yesterday.  She lost her balance while walking without a walker in her trailer home.  Patient is currently unable to ambulate on her own due to lower back pain and generalized weakness.  Her husband is older than her and has his old medical problems.  He is not strong enough to help her ambulate and he is worried that they could both fall.  Patient denies any focal weakness; she can move  all her extremities; she denies pain or numbness in her lower extremities.  MRI of the lumbar spine, done in the emergency room, reviewed by myself, shows interval L2 superior endplate fracture with 29% central loss of vertebral body height and mild edema indicating recent injury.  Other old fractures at thoracic and lumbar spine level are noted to be stable. Patient is admitted for further evaluation and pain control.  VITAL SIGNS:  Blood pressure (!) 159/56, pulse 66, temperature 98.2 F (36.8 C), temperature source Oral, resp. rate 18, height 5\' 3"  (1.6 m), weight 24 kg (53 lb), SpO2 100 %.  I/O:    Intake/Output Summary (Last 24 hours) at 11/09/2017 0951 Last data filed at 11/09/2017 0437 Gross per 24 hour  Intake 958.33 ml  Output 550 ml  Net 408.33 ml    PHYSICAL EXAMINATION:  GENERAL:  84 y.o.-year-old patient lying in the bed with no acute distress.  EYES: Pupils equal, round, reactive to light and accommodation. No scleral icterus. Extraocular muscles intact.  HEENT: Head atraumatic, normocephalic. Oropharynx and nasopharynx clear.  NECK:  Supple, no jugular venous distention. No thyroid enlargement, no tenderness.  LUNGS: Normal breath sounds bilaterally, no wheezing, rales,rhonchi or crepitation. No use of accessory muscles of respiration.  CARDIOVASCULAR: S1, S2 normal. No murmurs, rubs, or gallops.  ABDOMEN: Soft, non-tender, non-distended. Bowel sounds present. No organomegaly or mass.  EXTREMITIES: No pedal edema, cyanosis, or clubbing.  NEUROLOGIC: Cranial nerves II through XII are intact. Muscle strength 5/5 in all extremities. Sensation intact. Gait not checked.  PSYCHIATRIC: The patient is alert and oriented x 3.  SKIN: No obvious rash, lesion, or ulcer.   DATA REVIEW:   CBC Recent Labs  Lab 11/06/17 0335  WBC 7.5  HGB 10.2*  HCT 32.9*  PLT 82*    Chemistries  Recent Labs  Lab 11/06/17 0335  NA 138  K 4.3  CL 105  CO2 25  GLUCOSE 113*  BUN 23*   CREATININE 0.93  CALCIUM 9.7    Cardiac Enzymes No results for input(s): TROPONINI in the last 168 hours.  Microbiology Results  Results for orders placed or performed during the hospital encounter of 11/05/17  Surgical PCR screen     Status: None   Collection Time: 11/08/17  6:16 AM  Result Value Ref Range Status   MRSA, PCR NEGATIVE NEGATIVE Final   Staphylococcus aureus NEGATIVE NEGATIVE Final    Comment: (NOTE) The Xpert SA Assay (FDA approved for NASAL specimens in patients 65 years of age and older), is one component of a comprehensive surveillance program. It is not intended to diagnose infection nor to guide or monitor treatment. Performed at [facility], [address] [place], [place] [address]     RADIOLOGY:  Dg Lumbar Spine 2-3 Views  Result Date: 11/08/2017 CLINICAL DATA:  84 year old female undergoing kyphoplasty. Prior compression fractures. EXAM: LUMBAR SPINE - 2-3 VIEW; DG C-ARM 61-120 MIN COMPARISON:  Lumbar MRI 11/05/2017. thoracic and lumbar spine radiographs 11/05/2017. FINDINGS: Eleven pairs of ribs are again noted, the same numbering system is used as on the 11/05/2017 thoracic radiograph designating absent ribs at T12. There are chronic T11 and T12 compression fractures, previous augmentation of T12. Two intraoperative fluoroscopic spot views demonstrate interval kyphoplasty of the level subjacent to the previously augmented level (L1). Mild superior endplate compression of that level re-demonstrated. By this numbering system, the L5 level is sacralized. IMPRESSION: Transitional spinal anatomy with 11 pairs of ribs and 5 lumbar type vertebral bodies. I designate the T12 level as having absent ribs, and therefore a sacralized L5 level. Kyphoplasty of the vertebral body subjacent to the previously augmented level for treatment of mild superior endplate compression fracture as demonstrated on MRI 11/05/2017. Electronically Signed   By: Genevie Ann M.D.   On:  11/08/2017 12:30   Dg C-arm 1-60 Min  Result Date: 11/08/2017 CLINICAL DATA:  84 year old female undergoing kyphoplasty. Prior compression fractures. EXAM: LUMBAR SPINE - 2-3 VIEW; DG C-ARM 61-120 MIN COMPARISON:  Lumbar MRI 11/05/2017. thoracic and lumbar spine radiographs 11/05/2017. FINDINGS: Eleven pairs of ribs are again noted, the same numbering system is used as on the 11/05/2017 thoracic radiograph designating absent ribs at T12. There are chronic T11 and T12 compression fractures, previous augmentation of T12. Two intraoperative fluoroscopic spot views demonstrate interval kyphoplasty of the level subjacent to the previously augmented level (L1). Mild superior endplate compression of that level re-demonstrated. By this numbering system, the L5 level is sacralized. IMPRESSION: Transitional spinal anatomy with 11 pairs of ribs and 5 lumbar type vertebral bodies. I designate the T12 level as having absent ribs, and therefore a sacralized L5 level. Kyphoplasty of the vertebral body subjacent to the previously augmented level for treatment of mild superior endplate compression fracture as demonstrated on MRI 11/05/2017. Electronically Signed   By: Genevie Ann M.D.   On: 11/08/2017 12:30    EKG:   Orders placed or performed during the hospital encounter of 11/04/17  . ED EKG  . ED EKG  . EKG 12-Lead  . EKG 12-Lead  . EKG 12-Lead  . EKG 12-Lead      Management plans discussed with the patient, family and they are in agreement.  CODE STATUS:     Code Status Orders  (From admission, onward)        Start     Ordered   11/06/17 0102  Full code  Continuous     11/06/17 0101    Code Status History    Date Active Date Inactive Code Status Order ID Comments User Context   08/11/2017 07:33 08/13/2017 20:53 Full Code [id number]  Hillary Bow, MD ED   04/28/2016 18:20 04/30/2016 17:21 Full Code [id number]  [name], MD Inpatient   03/14/2015 12:55 03/15/2015 18:08 Full Code [id number]  [name], MD Inpatient   01/29/2015 05:27 01/30/2015 17:24 Full Code [id number]  Harrie Foreman, MD Inpatient    Advance Directive Documentation     Most Recent Value  Type of Advance Directive  Living will  Pre-existing out of facility DNR order (yellow form or pink MOST form)  No data  "MOST" Form in Place?  No data      TOTAL TIME TAKING CARE OF THIS PATIENT: 45 minutes.  Avel Peace Gara Kincade M.D on 11/09/2017 at 9:51 AM  Between 7am to 6pm - Pager - [phone]  After 6pm go to [email or web address] - password [email or web address] [facility]  Office  [phone]  CC: Primary care physician; [name], MD   Note: This dictation was prepared with Dragon dictation along with smaller phrase technology. Any transcriptional errors that result from this process are unintentional.

## 2017-11-09 NOTE — Clinical Social Work Placement (Signed)
   CLINICAL SOCIAL WORK PLACEMENT  NOTE  Date:  11/09/2017  Patient Details  Name: Stephanie Harris MRN: [id number] Date of Birth: 03/31/1931  Clinical Social Work is seeking post-discharge placement for this patient at the [place] level of care (*CSW will initial, date and re-position this form in  chart as items are completed):  Yes   Patient/family provided with [facility] Work Department's list of facilities offering this level of care within the geographic area requested by the patient (or if unable, by the patient's family).  Yes   Patient/family informed of their freedom to choose among providers that offer the needed level of care, that participate in Medicare, Medicaid or managed care program needed by the patient, have an available bed and are willing to accept the patient.  Yes   Patient/family informed of [facility]'s ownership interest in [facility] and [facility], as well as of the fact that they are under no obligation to receive care at these facilities.  PASRR submitted to EDS on       PASRR number received on       Existing PASRR number confirmed on 11/07/17     FL2 transmitted to all facilities in geographic area requested by pt/family on 11/07/17     FL2 transmitted to all facilities within larger geographic area on       Patient informed that his/her managed care company has contracts with or will negotiate with certain facilities, including the following:        Yes   Patient/family informed of bed offers received.  Patient chooses bed at [facility] )     Physician recommends and patient chooses bed at      Patient to be transferred to [facility] ) on 11/09/17.  Patient to be transferred to facility by [facility] )     Patient family notified on 11/09/17 of transfer.  Name of family member notified:  (Patient's husband Stephanie Harris is aware of D/C today. )     PHYSICIAN       Additional  Comment:    _______________________________________________ Zarin Knupp, Veronia Beets, LCSW 11/09/2017, 11:33 AM

## 2017-11-09 NOTE — Progress Notes (Signed)
Extensive education on a bowel regime. Patient stated as a Hx of constipation but refuses partial medication to facilitate bowel motility.

## 2017-11-09 NOTE — Progress Notes (Signed)
Patient is medically stable for D/C to [facility] today. Per [facility] admissions coordinator at [facility] patient can come today to room 210-A. RN will call report at [phone] and arrange EMS for transport. Clinical Education officer, museum (CSW) sent D/C orders to [facility] via [employer]. Patient is aware of above. Patient's husband Collins Scotland is at bedside and aware of above. Please reconsult if future social work needs arise. CSW signing off.   [employer], LCSW [phone]

## 2017-11-09 NOTE — Progress Notes (Signed)
Social Work Theatre manager met with patient alone at bedside to discuss semi-private room at [facility]. Patient is agreeable to semi-private room at [facility] for rehab.  Susy Frizzle, Social Work Intern [phone]

## 2017-11-09 NOTE — Progress Notes (Signed)
Called EMS for transport. 

## 2017-11-10 ENCOUNTER — Other Ambulatory Visit: Payer: Self-pay

## 2017-11-10 DIAGNOSIS — C9110 Chronic lymphocytic leukemia of B-cell type not having achieved remission: Secondary | ICD-10-CM | POA: Diagnosis not present

## 2017-11-10 DIAGNOSIS — M8000XA Age-related osteoporosis with current pathological fracture, unspecified site, initial encounter for fracture: Secondary | ICD-10-CM | POA: Diagnosis not present

## 2017-11-10 DIAGNOSIS — I779 Disorder of arteries and arterioles, unspecified: Secondary | ICD-10-CM | POA: Diagnosis not present

## 2017-11-10 LAB — SURGICAL PATHOLOGY

## 2017-11-10 MED ORDER — OXYCODONE-ACETAMINOPHEN 5-325 MG PO TABS: 1.0000 | ORAL_TABLET | Freq: Four times a day (QID) | ORAL | 0 refills | Status: DC | PRN

## 2017-11-10 NOTE — Telephone Encounter (Signed)
Rx sent to [facility] phone : [phone] , fax : [phone]  

## 2017-11-12 ENCOUNTER — Telehealth: Payer: Self-pay

## 2017-11-12 NOTE — Telephone Encounter (Signed)
Pt called left vm that she was at [facility] rehab and in rm 210 but didn't have phone # for a return call.  She asked if we could call her back.  I called her home # and spoke to her son and advised that pt wanted [place] to call her back but I had no number to rtn call.  He advised that he spoke to pt before me and seemed ok and if anything is going on that he would call us back and let us know.  dbs

## 2017-11-16 ENCOUNTER — Ambulatory Visit: Payer: Self-pay | Admitting: Internal Medicine

## 2017-11-18 ENCOUNTER — Encounter: Payer: Self-pay | Admitting: Gerontology

## 2017-11-18 ENCOUNTER — Non-Acute Institutional Stay (SKILLED_NURSING_FACILITY): Payer: Medicare Other | Admitting: Gerontology

## 2017-11-18 DIAGNOSIS — S32020D Wedge compression fracture of second lumbar vertebra, subsequent encounter for fracture with routine healing: Secondary | ICD-10-CM

## 2017-11-18 NOTE — Progress Notes (Signed)
Location:   [facility] Room Number: [place] of Service:  SNF [phone])  Provider: Toni Arthurs, NP-C  PCP: [name], MD Patient Care Team: [name], MD as PCP - General (Internal Medicine)  Extended Emergency Contact Information Primary Emergency Contact: Kipp Brood Address: [address] [place], [place] [address] [place] Phone: [phone] Relation: Spouse  Code Status: DNR Goals of care:  Advanced Directive information Advanced Directives 11/18/2017  Does Patient Have a Medical Advance Directive? Yes  Type of Advance Directive Living will;Out of facility DNR (pink MOST or yellow form)  Does patient want to make changes to medical advance directive? No - Patient declined  Would patient like information on creating a medical advance directive? -     Allergies  Allergen Reactions  . Latex Rash  . Penicillins Other (See Comments)    Has patient had a PCN reaction causing immediate rash, facial/tongue/throat swelling, SOB or lightheadedness with hypotension: Unknown Has patient had a PCN reaction causing severe rash involving mucus membranes or skin necrosis: Unknown Has patient had a PCN reaction that required hospitalization: Unknown Has patient had a PCN reaction occurring within the last 10 years: Unknown If all of the above answers are "NO", then may proceed with Cephalosporin use.   . Cefuroxime Axetil Nausea And Vomiting    Chief Complaint  Patient presents with  . Discharge Note    Discharged from SNF    HPI:  84 y.o. female seen today for discharge evaluation.  Patient was admitted to the facility for rehab following lumbar compression fracture with subsequent kyphoplasty at [facility].  Patient has been participating in PT/OT.  Patient reports pain is fairly well controlled.  Patient reports she does have some periods of worsening pain, but symptoms are controlled overall with the  Percocet.  Patient is wearing her lumbar sacral corset support.  She reports her appetite is fair, voiding well and having regular BMs.  She is scheduled for a follow-up with her oncologist on Monday at [facility] where she will have a Procrit injection.  She was not happy that she was not able to get it while at rehab.  However, in acceptable plan is in place for her to get the shot at Monday while she is already at [facility].  Aside from feeling tired, patient reports she is feeling well and ready to go home tomorrow.  All questions answered.  Vital signs stable.  No other complaints.    Past Medical History:  Diagnosis Date  . Acute bronchiolitis   . Acute pharyngitis   . Allergic rhinitis due to pollen   . Anemia, unspecified    CKD and LGL  . Anxiety state    unspecified  . Asthma without status asthmaticus    unspecified  . B12 deficiency   . Beta thalassemia trait   . Carotid artery occlusion   . Cellulitis and abscess of leg, except foot   . Cervical spondylosis   . Cervicalgia   . Chronic kidney disease   . CML (chronic myelocytic leukemia) ([place])    Onc at [facility]  . Coronary artery disease 2009   heart attack with stent  . Coronary atherosclerosis of native coronary artery   . Depressive disorder    not elsewhere classified  . Diabetes mellitus type 2, uncomplicated ([place])   . Difficulty in walking   . Disorder of breast,  unspecified   . Dizziness and giddiness   . Dysuria   . Esophageal reflux   . Essential hypertension    unspecified  . Head injury    unspecified  . Heart disease   . Hematuria, unspecified   . Hyperlipidemia, unspecified   . Hypersomnia, unspecified   . Hypertension   . Hypopotassemia   . Hypothyroidism    unspecified  . ICH (intracerebral hemorrhage) ([place])   . Ill-defined cerebrovascular disease    other  . Insomnia    unspecified  . 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 Leukemia ([place])   . Lower urinary tract infection   . Lumbago     . Mini stroke ([place])    x years  . Mixed hyperlipidemia   . Nontoxic nodular goiter    unspecified  . Occlusion and stenosis of unspecified carotid artery    without mention of cerebral infarction  . Osteoarthritis   . Osteoporosis   . Other constipation   . Other vitamin B12 deficiency anemias   . Otitis media, unspecified, unspecified ear   . Ovarian failure    unspecified  . Pain in limb   . Panic disorder without agoraphobia   . Peripheral vascular disease ([place])    unspecified  . Phlebitis of left arm   . RA (rheumatoid arthritis) ([place])   . Sigmoid polyp 1998  . Sleep disturbance    unspecified  . Stroke ([place])   . Syncope and collapse   . Thalassemia   . TIA (transient ischemic attack)    2000 and 2008 right carotid stent 08/14/2009 on Plavix  . Transient disorder of initiating or maintaining sleep   . Varicose veins of bilateral lower extremities with other complications   . Varicose veins of bilateral lower extremities with other complications   . Vision abnormalities     Past Surgical History:  Procedure Laterality Date  . BACK SURGERY    . CARDIAC CATHETERIZATION    . CAROTID ENDARTERECTOMY Left   . CATARACT EXTRACTION    . CHOLECYSTECTOMY  1980  . COLONOSCOPY  2010  . EYELID SURGERY Bilateral 2005   BUL BLEPH  . FRACTURE SURGERY    . JOINT REPLACEMENT    . KYPHOPLASTY N/A 08/12/2017   Procedure: KYPHOPLASTY;  Surgeon: Hessie Knows, MD;  Location: [facility] ORS;  Service: Orthopedics;  Laterality: N/A;  . KYPHOPLASTY N/A 11/08/2017   Procedure: Hewitt Shorts;  Surgeon: Hessie Knows, MD;  Location: [facility] ORS;  Service: Orthopedics;  Laterality: N/A;  . LENS EYE SURGERY Left 06/16/2001   +23.5D  . LENS EYE SURGERY Right 05/28/2003   22.5D  . PERCUTANEOUS CORONARY STENT INTERVENTION (PCI-S)    . PERCUTANEOUS PLACEMENT INTRAVASCULAR STENT CERVICAL CAROTID ARTERY  06/14/2009  . ROTATOR CUFF REPAIR Left   . SUPRACERVICAL ABDOMINAL HYSTERECTOMY  1978   WITH  REMOVAL TUBES &/OR OVARIES  . TOTAL KNEE ARTHROPLASTY Right       reports that she has never smoked. She has never used smokeless tobacco. She reports that she does not drink alcohol or use drugs. Social History   Socioeconomic History  . Marital status: Married    Spouse name: Not on file  . Number of children: Not on file  . Years of education: Not on file  . Highest education level: Not on file  Occupational History  . Not on file  Social Needs  . Financial resource strain: Not on file  . Food insecurity:    Worry: Not on  file    Inability: Not on file  . Transportation needs:    Medical: Not on file    Non-medical: Not on file  Tobacco Use  . Smoking status: Never Smoker  . Smokeless tobacco: Never Used  Substance and Sexual Activity  . Alcohol use: No  . Drug use: No  . Sexual activity: Never  Lifestyle  . Physical activity:    Days per week: Not on file    Minutes per session: Not on file  . Stress: Not on file  Relationships  . Social connections:    Talks on phone: Not on file    Gets together: Not on file    Attends religious service: Not on file    Active member of club or organization: Not on file    Attends meetings of clubs or organizations: Not on file    Relationship status: Not on file  . Intimate partner violence:    Fear of current or ex partner: Not on file    Emotionally abused: Not on file    Physically abused: Not on file    Forced sexual activity: Not on file  Other Topics Concern  . Not on file  Social History Narrative  . Not on file   Functional Status Survey:    Allergies  Allergen Reactions  . Latex Rash  . Penicillins Other (See Comments)    Has patient had a PCN reaction causing immediate rash, facial/tongue/throat swelling, SOB or lightheadedness with hypotension: Unknown Has patient had a PCN reaction causing severe rash involving mucus membranes or skin necrosis: Unknown Has patient had a PCN reaction that required  hospitalization: Unknown Has patient had a PCN reaction occurring within the last 10 years: Unknown If all of the above answers are "NO", then may proceed with Cephalosporin use.   . Cefuroxime Axetil Nausea And Vomiting    Pertinent  Health Maintenance Due  Topic Date Due  . DEXA SCAN  01/25/1998  . PNA vac Low Risk Adult (1 of 2 - PCV13) 01/25/1998  . INFLUENZA VACCINE  Completed    Medications: Allergies as of 11/18/2017      Reactions   Latex Rash   Penicillins Other (See Comments)   Has patient had a PCN reaction causing immediate rash, facial/tongue/throat swelling, SOB or lightheadedness with hypotension: Unknown Has patient had a PCN reaction causing severe rash involving mucus membranes or skin necrosis: Unknown Has patient had a PCN reaction that required hospitalization: Unknown Has patient had a PCN reaction occurring within the last 10 years: Unknown If all of the above answers are "NO", then may proceed with Cephalosporin use.   Cefuroxime Axetil Nausea And Vomiting      Medication List        Accurate as of 11/18/17  2:37 PM. Always use your most recent med list.          amLODipine 5 MG tablet Commonly known as:  NORVASC Take 1 tablet (5 mg total) by mouth daily.   ascorbic acid 1000 MG tablet Commonly known as:  VITAMIN C Take 1,000 mg by mouth daily.   aspirin 81 MG EC tablet Take 81 mg by mouth daily.   carvedilol 3.125 MG tablet Commonly known as:  COREG Take 6.25 mg by mouth 2 (two) times daily.   cholecalciferol 1000 units tablet Commonly known as:  VITAMIN D Take 1,000 Units by mouth daily.   cyanocobalamin 1000 MCG/ML injection Commonly known as:  (VITAMIN B-12) Inject 1,000 mcg into  the muscle every 30 (thirty) days. At the 1st of the month - GIVING EARLY AT PT REQUEST- PT MISSED THE INJECTION LAST MONTH   docusate sodium 100 MG capsule Commonly known as:  COLACE Take 100 mg by mouth daily as needed.   glimepiride 1 MG  tablet Commonly known as:  AMARYL Take 1 mg by mouth daily.   levothyroxine 50 MCG tablet Commonly known as:  SYNTHROID, LEVOTHROID Take 50 mcg by mouth daily.   ondansetron 4 MG tablet Commonly known as:  ZOFRAN Take 4 mg by mouth every 8 (eight) hours as needed for nausea or vomiting.   oxyCODONE-acetaminophen 5-325 MG tablet Commonly known as:  PERCOCET/ROXICET Take 1 tablet by mouth every 6 (six) hours as needed for severe pain.   polyethylene glycol packet Commonly known as:  MIRALAX / GLYCOLAX Take 17 g by mouth daily as needed.   vitamin E 400 UNIT capsule Take 400 Units by mouth daily.       Review of Systems  Constitutional: Positive for fatigue. Negative for activity change, appetite change, chills, diaphoresis and fever.  HENT: Negative for congestion, mouth sores, nosebleeds, postnasal drip, sneezing, sore throat, trouble swallowing and voice change.   Respiratory: Negative for apnea, cough, choking, chest tightness, shortness of breath and wheezing.   Cardiovascular: Negative for chest pain, palpitations and leg swelling.  Gastrointestinal: Negative for abdominal distention, abdominal pain, constipation, diarrhea and nausea.  Genitourinary: Negative for difficulty urinating, dysuria, frequency and urgency.  Musculoskeletal: Positive for arthralgias (typical arthritis), back pain, gait problem and myalgias.  Skin: Negative for color change, pallor, rash and wound.  Neurological: Positive for weakness. Negative for dizziness, tremors, syncope, speech difficulty, numbness and headaches.  Psychiatric/Behavioral: Negative for agitation and behavioral problems.  All other systems reviewed and are negative.   Vitals:   11/18/17 1328  BP: (!) 137/55  Pulse: 79  Resp: 18  Temp: 98.1 F (36.7 C)  TempSrc: Oral  SpO2: 98%  Weight: 90 lb 3.2 oz (40.9 kg)  Height: 5\' 3"  (1.6 m)   Body mass index is 15.98 kg/m. Physical Exam  Constitutional: She is oriented to  person, place, and time. Vital signs are normal. She appears well-developed. She is active and cooperative. She does not appear ill. No distress.  Thin, frail-appearing  HENT:  Head: Normocephalic and atraumatic.  Mouth/Throat: Uvula is midline, oropharynx is clear and moist and mucous membranes are normal. Mucous membranes are not pale, not dry and not cyanotic.  Eyes: Pupils are equal, round, and reactive to light. Conjunctivae, EOM and lids are normal.  Neck: Trachea normal, normal range of motion and full passive range of motion without pain. Neck supple. No JVD present. No tracheal deviation, no edema and no erythema present. No thyromegaly present.  Cardiovascular: Normal rate, regular rhythm, normal heart sounds, intact distal pulses and normal pulses. Exam reveals no gallop, no distant heart sounds and no friction rub.  No murmur heard. Pulses:      Dorsalis pedis pulses are 2+ on the right side, and 2+ on the left side.  No edema  Pulmonary/Chest: Effort normal and breath sounds normal. No accessory muscle usage. No respiratory distress. She has no decreased breath sounds. She has no wheezes. She has no rhonchi. She has no rales. She exhibits no tenderness.  Abdominal: Soft. Normal appearance and bowel sounds are normal. She exhibits no distension and no ascites. There is no tenderness.  Musculoskeletal: Normal range of motion. She exhibits no edema or tenderness.  Expected osteoarthritis, stiffness; Bilateral Calves soft, supple. Negative Homan's Sign. B- pedal pulses equal; generalized weakness, L2 compression fracture with kyphoplasty  Neurological: She is alert and oriented to person, place, and time. She has normal strength. She displays atrophy. She exhibits abnormal muscle tone. Coordination abnormal.  Skin: Skin is warm, dry and intact. Ecchymosis, purpura and rash noted. She is not diaphoretic. No cyanosis. No pallor. Nails show no clubbing.  Scattered ecchymosis and purpura   Psychiatric: Her speech is normal and behavior is normal. Judgment and thought content normal. Her mood appears anxious. Her affect is labile. Cognition and memory are normal.  Nursing note and vitals reviewed.   Labs reviewed: Basic Metabolic Panel: Recent Labs    11/04/17 1122 11/05/17 2245 11/06/17 0335  NA 139 138 138  K 4.1 4.9 4.3  CL 106 105 105  CO2 23 23 25   GLUCOSE 137* 113* 113*  BUN 20 21* 23*  CREATININE 1.01* 0.94 0.93  CALCIUM 9.9 9.9 9.7   Liver Function Tests: Recent Labs    03/23/17 1635 07/28/17 1726 07/31/17 0313  AST 26 24 28   ALT 12* 15 15  ALKPHOS 29* 34* 32*  BILITOT 2.2* 1.7* 1.7*  PROT 6.9 7.7 7.6  ALBUMIN 3.8 4.0 3.8   Recent Labs    07/28/17 1726  LIPASE 22   No results for input(s): AMMONIA in the last 8760 hours. CBC: Recent Labs    07/28/17 1726  11/04/17 1122 11/05/17 2245 11/06/17 0335  WBC 5.4   < > 6.4 7.7 7.5  NEUTROABS 2.0  --  2.0 3.0  --   HGB 10.4*   < > 10.4* 11.2* 10.2*  HCT 32.3*   < > 34.5* 36.5 32.9*  MCV 62.6*   < > 63.1* 63.0* 62.4*  PLT 156   < > 96* 99* 82*   < > = values in this interval not displayed.   Cardiac Enzymes: Recent Labs    08/11/17 0516 08/11/17 0845 08/11/17 1444  TROPONINI <0.03 <0.03 <0.03   BNP: Invalid input(s): POCBNP CBG: Recent Labs    11/08/17 0803 11/08/17 1220 11/09/17 0736  GLUCAP 105* 111* 85    Procedures and Imaging Studies During Stay: Dg Thoracic Spine 2 View  Result Date: 11/05/2017 CLINICAL DATA:  84 year old female status post fall yesterday. Previous thoracic and lumbar compression fractures. EXAM: THORACIC SPINE 2 VIEWS COMPARISON:  Cervical spine CT 11/04/2017. Images from vertebral augmentation 08/12/2017. Thoracic and lumbar MRI 08/11/2017. FINDINGS: Eleven rib-bearing vertebra. Absent ribs at T12. Chronic T11 and T12 compression fractures, the latter has been previously augmented. Visible lumbar levels remain intact. No new thoracic spine compression  fracture identified. Mildly exaggerated thoracic scoliosis. Cervicothoracic junction alignment is within normal limits. Posterior ribs appear intact. Thoracic visceral contours appear stable. IMPRESSION: 1. This numbering system differs from that on the 08/11/2017 MRIs. 2. Chronic compression fractures at T11 and T12 (absent ribs at T12). Previous T12 augmentation. 3.  No acute osseous abnormality identified. Electronically Signed   By: Genevie Ann M.D.   On: 11/05/2017 18:15   Dg Lumbar Spine 2-3 Views  Result Date: 11/08/2017 CLINICAL DATA:  84 year old female undergoing kyphoplasty. Prior compression fractures. EXAM: LUMBAR SPINE - 2-3 VIEW; DG C-ARM 61-120 MIN COMPARISON:  Lumbar MRI 11/05/2017. thoracic and lumbar spine radiographs 11/05/2017. FINDINGS: Eleven pairs of ribs are again noted, the same numbering system is used as on the 11/05/2017 thoracic radiograph designating absent ribs at T12. There are chronic T11 and T12 compression fractures,  previous augmentation of T12. Two intraoperative fluoroscopic spot views demonstrate interval kyphoplasty of the level subjacent to the previously augmented level (L1). Mild superior endplate compression of that level re-demonstrated. By this numbering system, the L5 level is sacralized. IMPRESSION: Transitional spinal anatomy with 11 pairs of ribs and 5 lumbar type vertebral bodies. I designate the T12 level as having absent ribs, and therefore a sacralized L5 level. Kyphoplasty of the vertebral body subjacent to the previously augmented level for treatment of mild superior endplate compression fracture as demonstrated on MRI 11/05/2017. Electronically Signed   By: Genevie Ann M.D.   On: 11/08/2017 12:30   Dg Lumbar Spine Complete  Result Date: 11/05/2017 CLINICAL DATA:  Fall yesterday.  Low back pain. EXAM: LUMBAR SPINE - COMPLETE 4+ VIEW COMPARISON:  07/31/2017 CT abdomen/pelvis FINDINGS: This report assumes 5 non rib-bearing lumbar vertebrae. Severe T12 chronic  stable vertebral compression fracture. Severe chronic L1 vertebral compression fracture status post vertebroplasty. Remaining lumbar vertebral body heights are preserved, with no evidence of acute fracture. Moderate multilevel lumbar spondylosis, most prominent at L2-3. No acute spondylolisthesis. Stable mild 2 mm retrolisthesis at L3-4 and mild 3 mm anterolisthesis at L4-5. Mild bilateral lower lumbar facet arthropathy. No aggressive appearing focal osseous lesions. Cholecystectomy clip is seen in the right upper quadrant of the abdomen. Abdominal aortic atherosclerosis. IMPRESSION: 1. No acute lumbar spine fracture or malalignment. 2. Stable chronic T12 and L1 vertebral compression fractures with vertebroplasty at L1. 3. Moderate multilevel lumbar degenerative disc disease, stable. 4. Lower lumbar facet arthropathy. 5. Mild multilevel lumbar spondylolisthesis, stable. Electronically Signed   By: Ilona Sorrel M.D.   On: 11/05/2017 22:18   Dg Forearm Left  Result Date: 11/04/2017 CLINICAL DATA:  Left forearm pain with a hematoma posterior to the wrist due to a fall today. Initial encounter. EXAM: LEFT FOREARM - 2 VIEW COMPARISON:  None. FINDINGS: There is no evidence of fracture or other focal bone lesions. Soft tissues are unremarkable. IMPRESSION: Negative exam. Electronically Signed   By: Inge Rise M.D.   On: 11/04/2017 11:41   Ct Head Wo Contrast  Result Date: 11/04/2017 CLINICAL DATA:  Pt presents to ED via [facility] with c/o fall. Per EMS pt c/o L arm pain with small hematoma to posterior wrist, pt c/o back pain with hx of back surgeries and is currently receiving injections to her back. Pt states she hit her head, denies LOC, states hx of brain bleed. Pt is alert and oriented at this time. EXAM: CT HEAD WITHOUT CONTRAST CT CERVICAL SPINE WITHOUT CONTRAST TECHNIQUE: Multidetector CT imaging of the head and cervical spine was performed following the standard protocol without intravenous contrast.  Multiplanar CT image reconstructions of the cervical spine were also generated. COMPARISON:  05/05/2017 FINDINGS: CT HEAD FINDINGS Brain: No evidence of acute infarction, hemorrhage, hydrocephalus, extra-axial collection or mass lesion/mass effect. There is ventricular sulcal enlargement reflecting mild to moderate generalized atrophy. Patchy white matter hypoattenuation is also noted consistent with moderate to advanced chronic microvascular ischemic change. Hypoattenuation extends to the superior right foot to lobe which may reflect an old infarct. These findings are stable. Vascular: No hyperdense vessel or unexpected calcification. Skull: Normal. Negative for fracture or focal lesion. Sinuses/Orbits: Globes and orbits are unremarkable. Visualized sinuses and mastoid air cells are clear. Other: None. CT CERVICAL SPINE FINDINGS Alignment: Normal. Skull base and vertebrae: No acute fracture. No primary bone lesion or focal pathologic process. Soft tissues and spinal canal: No prevertebral fluid or swelling. No visible  canal hematoma. Disc levels: The cervical discs are well preserved in height. No significant disc bulging and no evidence of a disc herniation. There are facet degenerative changes bilaterally, greatest in the upper to mid cervical spine. Upper chest: No soft tissue masses or adenopathy. There are arterial vascular calcifications and a right carotid stent. Lung apices are clear. Other: None. IMPRESSION: HEAD CT 1. No acute intracranial abnormalities.  No skull fracture. 2. Atrophy and chronic microvascular ischemic change stable from the prior head CT. CERVICAL CT 1. No fracture or acute finding. Electronically Signed   By: Lajean Manes M.D.   On: 11/04/2017 12:14   Ct Cervical Spine Wo Contrast  Result Date: 11/04/2017 CLINICAL DATA:  Pt presents to ED via [facility] with c/o fall. Per EMS pt c/o L arm pain with small hematoma to posterior wrist, pt c/o back pain with hx of back surgeries and is  currently receiving injections to her back. Pt states she hit her head, denies LOC, states hx of brain bleed. Pt is alert and oriented at this time. EXAM: CT HEAD WITHOUT CONTRAST CT CERVICAL SPINE WITHOUT CONTRAST TECHNIQUE: Multidetector CT imaging of the head and cervical spine was performed following the standard protocol without intravenous contrast. Multiplanar CT image reconstructions of the cervical spine were also generated. COMPARISON:  05/05/2017 FINDINGS: CT HEAD FINDINGS Brain: No evidence of acute infarction, hemorrhage, hydrocephalus, extra-axial collection or mass lesion/mass effect. There is ventricular sulcal enlargement reflecting mild to moderate generalized atrophy. Patchy white matter hypoattenuation is also noted consistent with moderate to advanced chronic microvascular ischemic change. Hypoattenuation extends to the superior right foot to lobe which may reflect an old infarct. These findings are stable. Vascular: No hyperdense vessel or unexpected calcification. Skull: Normal. Negative for fracture or focal lesion. Sinuses/Orbits: Globes and orbits are unremarkable. Visualized sinuses and mastoid air cells are clear. Other: None. CT CERVICAL SPINE FINDINGS Alignment: Normal. Skull base and vertebrae: No acute fracture. No primary bone lesion or focal pathologic process. Soft tissues and spinal canal: No prevertebral fluid or swelling. No visible canal hematoma. Disc levels: The cervical discs are well preserved in height. No significant disc bulging and no evidence of a disc herniation. There are facet degenerative changes bilaterally, greatest in the upper to mid cervical spine. Upper chest: No soft tissue masses or adenopathy. There are arterial vascular calcifications and a right carotid stent. Lung apices are clear. Other: None. IMPRESSION: HEAD CT 1. No acute intracranial abnormalities.  No skull fracture. 2. Atrophy and chronic microvascular ischemic change stable from the prior head  CT. CERVICAL CT 1. No fracture or acute finding. Electronically Signed   By: Lajean Manes M.D.   On: 11/04/2017 12:14   Mr Lumbar Spine Wo Contrast  Result Date: 11/05/2017 CLINICAL DATA:  84 y/o F; mechanical fall, back pain, suspected cauda equina. History of CML. EXAM: MRI LUMBAR SPINE WITHOUT CONTRAST TECHNIQUE: Multiplanar, multisequence MR imaging of the lumbar spine was performed. No intravenous contrast was administered. COMPARISON:  08/11/2017 lumbar spine MRI. FINDINGS: Segmentation: 5 lumbar type non-rib-bearing vertebral bodies. 11 thoracic vertebral segments on prior study. Alignment:  Minimal L4-5 anterolisthesis. Vertebrae: Interval L2 superior endplate fracture with 45% central loss of height and mild edema indicating recent injury. Post augmentation of L1 vertebral body compression deformity. Stable moderate to severe compression deformity of lowest thoracic vertebral body. Bony retropulsion at T10-11 and T11-L1 with ventral thecal sac effacement. No significant canal stenosis. No additional fracture identified. Trace L4-5 facet effusions, likely degenerative.  Conus medullaris and cauda equina: Conus extends to the level. Conus and cauda equina appear normal. Paraspinal and other soft tissues: Negative. Disc levels: L1-2: No significant disc displacement, foraminal stenosis, or canal stenosis. L2-3: Small disc bulge. No significant foraminal or canal stenosis. L3-4: Small disc bulge. No significant foraminal or canal stenosis. L4-5: Minimal anterolisthesis with small disc bulge, facet, and ligamentum flavum hypertrophy. Mild bilateral foraminal. No significant canal stenosis. L5-S1: Small disc bulge and moderate facet hypertrophy. No significant foraminal or canal stenosis. IMPRESSION: 1. Interval L2 superior endplate fracture with 65% central loss of vertebral body height and mild edema indicating recent injury. 2. Stable T11 and L1 compression deformities. L1 kyphoplasty changes. 3. Stable  L3-S1 spondylosis greatest at L4-5 and L5-S1. No high-grade foraminal or canal stenosis. 4. L4-5 trace facet effusions, likely degenerative. Electronically Signed   By: Kristine Garbe M.D.   On: 11/05/2017 22:50   Dg C-arm 1-60 Min  Result Date: 11/08/2017 CLINICAL DATA:  84 year old female undergoing kyphoplasty. Prior compression fractures. EXAM: LUMBAR SPINE - 2-3 VIEW; DG C-ARM 61-120 MIN COMPARISON:  Lumbar MRI 11/05/2017. thoracic and lumbar spine radiographs 11/05/2017. FINDINGS: Eleven pairs of ribs are again noted, the same numbering system is used as on the 11/05/2017 thoracic radiograph designating absent ribs at T12. There are chronic T11 and T12 compression fractures, previous augmentation of T12. Two intraoperative fluoroscopic spot views demonstrate interval kyphoplasty of the level subjacent to the previously augmented level (L1). Mild superior endplate compression of that level re-demonstrated. By this numbering system, the L5 level is sacralized. IMPRESSION: Transitional spinal anatomy with 11 pairs of ribs and 5 lumbar type vertebral bodies. I designate the T12 level as having absent ribs, and therefore a sacralized L5 level. Kyphoplasty of the vertebral body subjacent to the previously augmented level for treatment of mild superior endplate compression fracture as demonstrated on MRI 11/05/2017. Electronically Signed   By: Genevie Ann M.D.   On: 11/08/2017 12:30    Assessment/Plan:    Closed compression fracture of L2 lumbar vertebra with routine healing, subsequent encounter  Continue PT/OT  Continue exercises as taught by PT/OT  Continue to wear lumbar sacral corset for support  Ice pack as needed for pain or inflammation  Continue oxycodone/APAP 5/325 mg 1 tablet p.o. every 6 hours as needed pain #30, no refill  Ambulate with assistance  Skin care per protocol  Follow-up with orthopedist next week as instructed  Patient is being discharged with the following  home health services: Outpatient PT  Patient is being discharged with the following durable medical equipment: None needed  Patient has been advised to f/u with their PCP in 1-2 weeks to bring them up to date on their rehab stay.  Social services at facility was responsible for arranging this appointment.  Pt was provided with a 30 day supply of prescriptions for medications and refills must be obtained from their PCP.  For controlled substances, a more limited supply may be provided adequate until PCP appointment only.  Future labs/tests needed:    Family/ staff Communication:   Total Time:  Documentation:  Face to Face:  Family/Phone:  Vikki Ports, NP-C [facility] [address], [place] [address] Cell Phone (Mon-Fri 8am-5pm):  [phone] On Call:  [phone] & follow prompts after 5pm & weekends Office Phone:  [phone] Office Fax:  [phone]

## 2017-11-22 ENCOUNTER — Other Ambulatory Visit: Payer: Self-pay | Admitting: Internal Medicine

## 2017-11-22 DIAGNOSIS — D589 Hereditary hemolytic anemia, unspecified: Secondary | ICD-10-CM | POA: Diagnosis not present

## 2017-11-22 DIAGNOSIS — Z7982 Long term (current) use of aspirin: Secondary | ICD-10-CM | POA: Diagnosis not present

## 2017-11-22 DIAGNOSIS — Z86010 Personal history of colonic polyps: Secondary | ICD-10-CM | POA: Diagnosis not present

## 2017-11-22 DIAGNOSIS — N183 Chronic kidney disease, stage 3 (moderate): Secondary | ICD-10-CM | POA: Diagnosis not present

## 2017-11-22 DIAGNOSIS — I610 Nontraumatic intracerebral hemorrhage in hemisphere, subcortical: Secondary | ICD-10-CM | POA: Diagnosis not present

## 2017-11-22 DIAGNOSIS — D696 Thrombocytopenia, unspecified: Secondary | ICD-10-CM | POA: Diagnosis not present

## 2017-11-22 DIAGNOSIS — Z79899 Other long term (current) drug therapy: Secondary | ICD-10-CM | POA: Diagnosis not present

## 2017-11-22 DIAGNOSIS — I252 Old myocardial infarction: Secondary | ICD-10-CM | POA: Diagnosis not present

## 2017-11-22 DIAGNOSIS — I129 Hypertensive chronic kidney disease with stage 1 through stage 4 chronic kidney disease, or unspecified chronic kidney disease: Secondary | ICD-10-CM | POA: Diagnosis not present

## 2017-11-22 DIAGNOSIS — D72820 Lymphocytosis (symptomatic): Secondary | ICD-10-CM | POA: Diagnosis not present

## 2017-11-22 DIAGNOSIS — Z8673 Personal history of transient ischemic attack (TIA), and cerebral infarction without residual deficits: Secondary | ICD-10-CM | POA: Diagnosis not present

## 2017-11-22 DIAGNOSIS — E1122 Type 2 diabetes mellitus with diabetic chronic kidney disease: Secondary | ICD-10-CM | POA: Diagnosis not present

## 2017-11-22 DIAGNOSIS — M4854XA Collapsed vertebra, not elsewhere classified, thoracic region, initial encounter for fracture: Secondary | ICD-10-CM | POA: Diagnosis not present

## 2017-11-22 DIAGNOSIS — D631 Anemia in chronic kidney disease: Secondary | ICD-10-CM | POA: Diagnosis not present

## 2017-11-22 DIAGNOSIS — Z87440 Personal history of urinary (tract) infections: Secondary | ICD-10-CM | POA: Diagnosis not present

## 2017-11-22 DIAGNOSIS — C91Z0 Other lymphoid leukemia not having achieved remission: Secondary | ICD-10-CM | POA: Diagnosis not present

## 2017-11-22 DIAGNOSIS — Z7984 Long term (current) use of oral hypoglycemic drugs: Secondary | ICD-10-CM | POA: Diagnosis not present

## 2017-11-22 MED ORDER — GLIMEPIRIDE 1 MG PO TABS: 1.0000 mg | ORAL_TABLET | Freq: Every day | ORAL | 3 refills | Status: DC

## 2017-11-24 DIAGNOSIS — Z9889 Other specified postprocedural states: Secondary | ICD-10-CM | POA: Diagnosis not present

## 2017-11-24 DIAGNOSIS — M4856XD Collapsed vertebra, not elsewhere classified, lumbar region, subsequent encounter for fracture with routine healing: Secondary | ICD-10-CM | POA: Diagnosis not present

## 2017-12-01 ENCOUNTER — Encounter: Payer: Self-pay | Admitting: Internal Medicine

## 2017-12-01 ENCOUNTER — Ambulatory Visit: Payer: Medicare Other | Admitting: Internal Medicine

## 2017-12-01 VITALS — BP 152/57 | HR 75 | Resp 16 | Ht 60.0 in | Wt 100.4 lb

## 2017-12-01 DIAGNOSIS — M8949 Other hypertrophic osteoarthropathy, multiple sites: Secondary | ICD-10-CM

## 2017-12-01 DIAGNOSIS — Z9889 Other specified postprocedural states: Secondary | ICD-10-CM

## 2017-12-01 DIAGNOSIS — D638 Anemia in other chronic diseases classified elsewhere: Secondary | ICD-10-CM | POA: Diagnosis not present

## 2017-12-01 DIAGNOSIS — I10 Essential (primary) hypertension: Secondary | ICD-10-CM

## 2017-12-01 DIAGNOSIS — M159 Polyosteoarthritis, unspecified: Secondary | ICD-10-CM

## 2017-12-01 DIAGNOSIS — M150 Primary generalized (osteo)arthritis: Secondary | ICD-10-CM

## 2017-12-01 DIAGNOSIS — E119 Type 2 diabetes mellitus without complications: Secondary | ICD-10-CM | POA: Diagnosis not present

## 2017-12-01 DIAGNOSIS — C9110 Chronic lymphocytic leukemia of B-cell type not having achieved remission: Secondary | ICD-10-CM

## 2017-12-01 LAB — POCT GLYCOSYLATED HEMOGLOBIN (HGB A1C): Hemoglobin A1C: 4.8

## 2017-12-01 NOTE — Addendum Note (Signed)
Addended by: Edd Arbour on: 12/01/2017 02:56 PM   Modules accepted: Orders

## 2017-12-01 NOTE — Progress Notes (Signed)
[facility] [place], [place] [address]  Internal MEDICINE  Office Visit Note  Patient Name: Stephanie Harris  [id number]  [id number]  Date of Service: 12/01/2017  Chief Complaint  Patient presents with  . closed compression fracture of second lumbar vertebra    hospital follow up  . Hypertension  . Hyperlipidemia  . Hypothyroidism    HPI  Pt is here for routine follow up. She was in the hospital for few days after her fall. Kyphoplastyx 2. First surgery in Dec 2018 for L1 and one in March 2019 for L2. Feels weak and has lost appetite. She is looking into meals on wheels.  C/o feet swelling after starting on norvasc. Sees hematology, Cardiology, Ortho  Current Medication: Outpatient Encounter Medications as of 12/01/2017  Medication Sig  . amLODipine (NORVASC) 5 MG tablet Take 1 tablet (5 mg total) by mouth daily.  [place] Kitchen ascorbic acid (VITAMIN C) 1000 MG tablet Take 1,000 mg by mouth daily.  [place] Kitchen aspirin 81 MG EC tablet Take 81 mg by mouth daily.   . carvedilol (COREG) 3.125 MG tablet Take 6.25 mg by mouth 2 (two) times daily.   . cholecalciferol (VITAMIN D) 1000 UNITS tablet Take 1,000 Units by mouth daily.  . cyanocobalamin (,VITAMIN B-12,) 1000 MCG/ML injection Inject 1,000 mcg into the muscle every 30 (thirty) days. At the 1st of the month - GIVING EARLY AT PT REQUEST- PT MISSED THE INJECTION LAST MONTH  . levothyroxine (SYNTHROID, LEVOTHROID) 50 MCG tablet Take 50 mcg by mouth daily.   . vitamin E 400 UNIT capsule Take 400 Units by mouth daily.  Mar[place] Kitchen docusate sodium (COLACE) 100 MG capsule Take 100 mg by mouth daily as needed.  [place] Kitchen glimepiride (AMARYL) 1 MG tablet Take 1 tablet (1 mg total) by mouth daily. (Patient not taking: Reported on 12/01/2017)  . ondansetron (ZOFRAN) 4 MG tablet Take 4 mg by mouth every 8 (eight) hours as needed for nausea or vomiting.  [place] Kitchen oxyCODONE-acetaminophen (PERCOCET/ROXICET) 5-325 MG tablet Take 1 tablet by mouth every 6 (six) hours  as needed for severe pain. (Patient not taking: Reported on 12/01/2017)  . polyethylene glycol (MIRALAX / GLYCOLAX) packet Take 17 g by mouth daily as needed.   No facility-administered encounter medications on file as of 12/01/2017.    Surgical History: Past Surgical History:  Procedure Laterality Date  . BACK SURGERY    . CARDIAC CATHETERIZATION    . CAROTID ENDARTERECTOMY Left   . CATARACT EXTRACTION    . CHOLECYSTECTOMY  1980  . COLONOSCOPY  2010  . EYELID SURGERY Bilateral 2005   BUL BLEPH  . FRACTURE SURGERY    . JOINT REPLACEMENT    . KYPHOPLASTY N/A 08/12/2017   Procedure: KYPHOPLASTY;  Surgeon: Hessie Knows, MD;  Location: [facility] ORS;  Service: Orthopedics;  Laterality: N/A;  . KYPHOPLASTY N/A 11/08/2017   Procedure: Hewitt Shorts;  Surgeon: Hessie Knows, MD;  Location: [facility] ORS;  Service: Orthopedics;  Laterality: N/A;  . LENS EYE SURGERY Left 06/16/2001   +23.5D  . LENS EYE SURGERY Right 05/28/2003   22.5D  . PERCUTANEOUS CORONARY STENT INTERVENTION (PCI-S)    . PERCUTANEOUS PLACEMENT INTRAVASCULAR STENT CERVICAL CAROTID ARTERY  06/14/2009  . ROTATOR CUFF REPAIR Left   . SUPRACERVICAL ABDOMINAL HYSTERECTOMY  1978   WITH REMOVAL TUBES &/OR OVARIES  . TOTAL KNEE ARTHROPLASTY Right     Medical History: Past Medical History:  Diagnosis Date  . Acute bronchiolitis   . Acute pharyngitis   .  Allergic rhinitis due to pollen   . Anemia, unspecified    CKD and LGL  . Anxiety state    unspecified  . Asthma without status asthmaticus    unspecified  . B12 deficiency   . Beta thalassemia trait   . Carotid artery occlusion   . Cellulitis and abscess of leg, except foot   . Cervical spondylosis   . Cervicalgia   . Chronic kidney disease   . CML (chronic myelocytic leukemia) ([place])    Onc at [facility]  . Coronary artery disease 2009   heart attack with stent  . Coronary atherosclerosis of native coronary artery   . Depressive disorder    not elsewhere classified  .  Diabetes mellitus type 2, uncomplicated ([place])   . Difficulty in walking   . Disorder of breast, unspecified   . Dizziness and giddiness   . Dysuria   . Esophageal reflux   . Essential hypertension    unspecified  . Head injury    unspecified  . Heart disease   . Hematuria, unspecified   . Hyperlipidemia, unspecified   . Hypersomnia, unspecified   . Hypertension   . Hypopotassemia   . Hypothyroidism    unspecified  . ICH (intracerebral hemorrhage) ([place])   . Ill-defined cerebrovascular disease    other  . Insomnia    unspecified  . Large granular lymphocyte disorder ([place]) 12/2001  . Leukemia ([place])   . Lower urinary tract infection   . Lumbago   . Mini stroke ([place])    x years  . Mixed hyperlipidemia   . Nontoxic nodular goiter    unspecified  . Occlusion and stenosis of unspecified carotid artery    without mention of cerebral infarction  . Osteoarthritis   . Osteoporosis   . Other constipation   . Other vitamin B12 deficiency anemias   . Otitis media, unspecified, unspecified ear   . Ovarian failure    unspecified  . Pain in limb   . Panic disorder without agoraphobia   . Peripheral vascular disease ([place])    unspecified  . Phlebitis of left arm   . RA (rheumatoid arthritis) ([place])   . Sigmoid polyp 1998  . Sleep disturbance    unspecified  . Stroke ([place])   . Syncope and collapse   . Thalassemia   . TIA (transient ischemic attack)    2000 and 2008 right carotid stent 08/14/2009 on Plavix  . Transient disorder of initiating or maintaining sleep   . Varicose veins of bilateral lower extremities with other complications   . Varicose veins of bilateral lower extremities with other complications   . Vision abnormalities     Family History: Family History  Problem Relation Age of Onset  . Acute myelogenous leukemia Brother   . Anemia Brother   . Coronary artery disease Brother   . Heart disease Brother   . Stroke Brother   . Heart failure Brother   . Stroke  Mother   . Anemia Mother   . Heart disease Mother   . Heart failure Mother   . Hypertension Mother   . Osteoarthritis Mother   . Rheum arthritis Mother   . Heart attack Father   . Anemia Sister   . Cataracts Sister   . Osteoarthritis Sister   . Rheum arthritis Sister   . Stroke Sister   . Heart disease Sister   . Thalassemia Sister   . Thalassemia Sister   . Blindness Neg Hx   .  Glaucoma Neg Hx   . Macular degeneration Neg Hx   . Strabismus Neg Hx   . Vision loss Neg Hx   . Basal cell carcinoma Neg Hx   . GU problems Neg Hx   . Kidney cancer Neg Hx   . Melanoma Neg Hx   . Kidney disease Neg Hx   . Prostate cancer Neg Hx   . Squamous cell carcinoma Neg Hx     Social History   Socioeconomic History  . Marital status: Married    Spouse name: Not on file  . Number of children: Not on file  . Years of education: Not on file  . Highest education level: Not on file  Occupational History  . Not on file  Social Needs  . Financial resource strain: Not on file  . Food insecurity:    Worry: Not on file    Inability: Not on file  . Transportation needs:    Medical: Not on file    Non-medical: Not on file  Tobacco Use  . Smoking status: Never Smoker  . Smokeless tobacco: Never Used  Substance and Sexual Activity  . Alcohol use: No  . Drug use: No  . Sexual activity: Never  Lifestyle  . Physical activity:    Days per week: Not on file    Minutes per session: Not on file  . Stress: Not on file  Relationships  . Social connections:    Talks on phone: Not on file    Gets together: Not on file    Attends religious service: Not on file    Active member of club or organization: Not on file    Attends meetings of clubs or organizations: Not on file    Relationship status: Not on file  . Intimate partner violence:    Fear of current or ex partner: Not on file    Emotionally abused: Not on file    Physically abused: Not on file    Forced sexual activity: Not on file   Other Topics Concern  . Not on file  Social History Narrative  . Not on file   Review of Systems  Constitutional: Positive for activity change, appetite change and fatigue.  HENT: Negative for congestion, hearing loss, sinus pain and tinnitus.   Cardiovascular: Negative for chest pain, palpitations and leg swelling.  Musculoskeletal: Positive for arthralgias and back pain.  Neurological: Positive for dizziness.    Vital Signs: BP (!) 152/57 (BP Location: Left Arm, Patient Position: Sitting, Cuff Size: Normal)   Pulse 75   Resp 16   Ht 5' (1.524 m)   Wt 100 lb 6.4 oz (45.5 kg)   SpO2 99%   BMI 19.61 kg/m    Physical Exam  Constitutional: She is oriented to person, place, and time.  Looks weak and frail   HENT:  Head: Normocephalic and atraumatic.  Eyes: Pupils are equal, round, and reactive to light. Conjunctivae and EOM are normal.  Neck: Neck supple.  Cardiovascular: Normal rate, regular rhythm and normal heart sounds.  Pulmonary/Chest: Effort normal and breath sounds normal.  Musculoskeletal: She exhibits deformity. She exhibits no tenderness.  Neurological: She is alert and oriented to person, place, and time.  Skin: Skin is warm and dry.  Psychiatric: She has a normal mood and affect. Her behavior is normal. Thought content normal.   Assessment/Plan: 1. Essential hypertension, benign Controlled with meds, edema induced by norvasc, pt was instructed to wear compression stockings   2. Diabetes mellitus  without complication (HCC) DC Amaryl due to low Hemoglobin A1c  3. 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 Anemia in other chronic diseases classified elsewhere Controlled   5. Primary osteoarthritis involving multiple joints [place]  6. S/P kyphoplasty PT  General Counseling: Aliany verbalizes understanding of the findings of todays visit and agrees with plan of treatment. I have discussed any further diagnostic evaluation that may  be needed or ordered today. We also reviewed her medications today. she has been encouraged to call the office with any questions or concerns that should arise related to todays visit.   Orders Placed This Encounter  Procedures  . Hemoglobin A1c    Time spent: 25Minutes Dr [name] Internal medicine

## 2017-12-02 ENCOUNTER — Ambulatory Visit: Payer: Medicare Other | Attending: Orthopedic Surgery | Admitting: Physical Therapy

## 2017-12-02 ENCOUNTER — Other Ambulatory Visit: Payer: Self-pay

## 2017-12-02 ENCOUNTER — Encounter: Payer: Self-pay | Admitting: Physical Therapy

## 2017-12-02 DIAGNOSIS — D649 Anemia, unspecified: Secondary | ICD-10-CM | POA: Diagnosis not present

## 2017-12-02 DIAGNOSIS — R296 Repeated falls: Secondary | ICD-10-CM

## 2017-12-02 DIAGNOSIS — R262 Difficulty in walking, not elsewhere classified: Secondary | ICD-10-CM

## 2017-12-02 DIAGNOSIS — M6281 Muscle weakness (generalized): Secondary | ICD-10-CM | POA: Diagnosis not present

## 2017-12-02 NOTE — Therapy (Signed)
[facility] [address], [place], [address] Phone: [phone]   Fax:  [phone]  Physical Therapy Evaluation  Patient Details  Name: Stephanie Harris MRN: [id number] Date of Birth: Oct 19, 1930 Referring Provider: Feliberto Gottron PA   Encounter Date: 12/02/2017  PT End of Session - 12/02/17 1820    Visit Number  1    Number of Visits  12    Date for PT Re-Evaluation  01/13/18    Authorization Type  1 of 10 progress note    PT Start Time  5003    PT Stop Time  7048    PT Time Calculation (min)  54 min    Equipment Utilized During Treatment  Gait belt;Other (comment) patient rollator    Activity Tolerance  Patient tolerated treatment well    Behavior During Therapy  [facility] for tasks assessed/performed       Past Medical History:  Diagnosis Date  . Acute bronchiolitis   . Acute pharyngitis   . Allergic rhinitis due to pollen   . Anemia, unspecified    CKD and LGL  . Anxiety state    unspecified  . Asthma without status asthmaticus    unspecified  . B12 deficiency   . Beta thalassemia trait   . Carotid artery occlusion   . Cellulitis and abscess of leg, except foot   . Cervical spondylosis   . Cervicalgia   . Chronic kidney disease   . CML (chronic myelocytic leukemia) ([place])    Onc at [facility]  . Coronary artery disease 2009   heart attack with stent  . Coronary atherosclerosis of native coronary artery   . Depressive disorder    not elsewhere classified  . Diabetes mellitus type 2, uncomplicated ([place])   . Difficulty in walking   . Disorder of breast, unspecified   . Dizziness and giddiness   . Dysuria   . Esophageal reflux   . Essential hypertension    unspecified  . Head injury    unspecified  . Heart disease   . Hematuria, unspecified   . Hyperlipidemia, unspecified   . Hypersomnia, unspecified   . Hypertension   . Hypopotassemia   . Hypothyroidism    unspecified  . ICH  (intracerebral hemorrhage) ([place])   . Ill-defined cerebrovascular disease    other  . Insomnia    unspecified  . 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 Leukemia ([place])   . Lower urinary tract infection   . Lumbago   . Mini stroke ([place])    x years  . Mixed hyperlipidemia   . Nontoxic nodular goiter    unspecified  . Occlusion and stenosis of unspecified carotid artery    without mention of cerebral infarction  . Osteoarthritis   . Osteoporosis   . Other constipation   . Other vitamin B12 deficiency anemias   . Otitis media, unspecified, unspecified ear   . Ovarian failure    unspecified  . Pain in limb   . Panic disorder without agoraphobia   . Peripheral vascular disease ([place])    unspecified  . Phlebitis of left arm   . RA (rheumatoid arthritis) ([place])   . Sigmoid polyp 1998  . Sleep disturbance    unspecified  . Stroke ([place])   . Syncope and collapse   . Thalassemia   . TIA (transient ischemic attack)    2000 and 2008 right carotid stent 08/14/2009 on Plavix  . Transient  disorder of initiating or maintaining sleep   . Varicose veins of bilateral lower extremities with other complications   . Varicose veins of bilateral lower extremities with other complications   . Vision abnormalities     Past Surgical History:  Procedure Laterality Date  . BACK SURGERY    . CARDIAC CATHETERIZATION    . CAROTID ENDARTERECTOMY Left   . CATARACT EXTRACTION    . CHOLECYSTECTOMY  1980  . COLONOSCOPY  2010  . EYELID SURGERY Bilateral 2005   BUL BLEPH  . FRACTURE SURGERY    . JOINT REPLACEMENT    . KYPHOPLASTY N/A 08/12/2017   Procedure: KYPHOPLASTY;  Surgeon: Hessie Knows, MD;  Location: [facility] ORS;  Service: Orthopedics;  Laterality: N/A;  . KYPHOPLASTY N/A 11/08/2017   Procedure: Hewitt Shorts;  Surgeon: Hessie Knows, MD;  Location: [facility] ORS;  Service: Orthopedics;  Laterality: N/A;  . LENS EYE SURGERY Left 06/16/2001   +23.5D  . LENS EYE SURGERY Right 05/28/2003    22.5D  . PERCUTANEOUS CORONARY STENT INTERVENTION (PCI-S)    . PERCUTANEOUS PLACEMENT INTRAVASCULAR STENT CERVICAL CAROTID ARTERY  06/14/2009  . ROTATOR CUFF REPAIR Left   . SUPRACERVICAL ABDOMINAL HYSTERECTOMY  1978   WITH REMOVAL TUBES &/OR OVARIES  . TOTAL KNEE ARTHROPLASTY Right     There were no vitals filed for this visit.   Subjective Assessment - 12/02/17 1718    Subjective  Patient reports she is feeling weak and has difficulty with gettiing up from a chair and with walking. She reports she has fallen multiple times within the past 6  months at home when walking without her walker. She recently fell and hurt her back and then underwent surgery (kyphoplasty). She went to a rehab center and now is coming to out patient rehab to strengthen further to walk better.     Pertinent History  Patient fell at home 11/04/17 injuring back and underwent kyphoplasty L2 on 11/08/2017. She has history of chronic myelocytic leukemia, HTN, osteoarthritis, RA, osterporosis, TIAs and CVA. She has had multple falls at home when not using AD.     Limitations  Standing;Walking;Sitting    How long can you sit comfortably?  short periods of time    How long can you stand comfortably?  <10 min    How long can you walk comfortably?  short distances with rollator    Patient Stated Goals  decrease back pain and improve walking to reduce falls    Currently in Pain?  Yes    Pain Score  7     Pain Location  Back    Pain Orientation  Lower    Pain Descriptors / Indicators  Aching    Pain Type  Acute pain    Pain Onset  1 to 4 weeks ago    Pain Frequency  Constant    Aggravating Factors   any activity    Pain Relieving Factors  rest         [facility] PT Assessment - 12/02/17 1740      Assessment   Medical Diagnosis  status post Kyphoplasty L2    Referring Provider  Feliberto Gottron PA    Hand Dominance  Right    Prior Therapy  yes multiple sessions for falls/balance      Precautions    Precautions  Fall      Restrictions   Weight Bearing Restrictions  No      Balance Screen   Has the patient fallen in the past  6 months  Yes    How many times?  4-5 times loses balance when walking without AD at home    Has the patient had a decrease in activity level because of a fear of falling?   No    Is the patient reluctant to leave their home because of a fear of falling?   No      Home Environment   Living Environment  Private residence    Living Arrangements  Spouse/significant other    Available Help at Discharge  Family    Type of [place] entrance;Level entry    Home Layout  One level    [place] - 4 wheels;Shower seat;Toilet riser      Prior Function   Level of Independence  Requires assistive device for independence;Needs assistance with homemaking    Vocation  Retired    Leisure  shopping      Cognition   Overall Cognitive Status  Within Functional Limits for tasks assessed      Sensation   Light Touch  Appears Intact      Posture/Postural Control   Posture Comments  forward flexed sitting and standing posture      Transfers   Comments  transfer sit to stand slowly, with pain in back reported      Ambulation/Gait   Gait Pattern  Antalgic : using rollator for support/safety      outcome measures: 10MW using rollator, gait belt for safety: 25.5 seconds TUG: 37 seconds 5x sit to stand: unable to assess due to pain  Strength: grossly assessed in sitting:  LE's bilaterally:  hip flexion 4-/5, knee extension 4-/5, knee flexion 4-/5; DF WFL; hip abduction , addition 4-/5     Objective measurements completed on examination: See above findings.     PT Education - 12/02/17 1800    Education provided  Yes    Education Details  HEP: sitting hip adduction with ball and glute sets, SLR seated, roll ball under foot.     Person(s) Educated  Patient    Methods  Explanation;Demonstration;Verbal cues     Comprehension  Verbal cues required;Returned demonstration;Verbalized understanding          PT Long Term Goals - 12/02/17 1914      PT LONG TERM GOAL #1   Title  Patient will complete a TUG test in <20 seconds with an AD for independent mobility and decreased fall risk     Baseline  37 seconds with rollator    Status  New    Target Date  12/23/17      PT LONG TERM GOAL #2   Title  Patient  will complete 5x/sit to stand test in < 25 seconds indicating improved strength and decreased fall risk    Status  New    Target Date  12/30/17      PT LONG TERM GOAL #3   Title  improved 10Mw test iin 15 seconds with rollator and close supervision demonstrating improvement with ambulation and decreased fall risk    Baseline  10MW 25.5 seconds (.39 m/s)    Status  New    Target Date  01/13/18      PT LONG TERM GOAL #4   Title  Patient will be independent with home program for strength and balance activities to allow transition to self management     Baseline  requries instruction, guidance, cuing for exercises  Status  New    Target Date  01/13/18             Plan - 12/02/17 2055    Clinical Impression Statement  Patient is an 84 year old right hand dominant female who presents with deficits in strength, transfers, balance, activity tolerance,mobility following a fall with back injury and Kyphoplasty L2. She will require physical therapy intervention to improve strenght, endurance to improve function with ambulation and daily tasks with less difficulty.     History and Personal Factors relevant to plan of care:  Patient fell at home 11/04/17 injuring back and underwent kyphoplasty L2 on 11/08/2017. She has history of chronic myelocytic leukemia, HTN, osteoarthritis, RA, osterporosis, TIAs and CVA. She has had multple falls at home when not using AD.     Clinical Presentation  Stable    Clinical Presentation due to:  improving siince surgery 11/08/2017    Clinical Decision Making  Low     Rehab Potential  Good    Clinical Impairments Affecting Rehab Potential  (+)motivated, acute condition(-)multiple comorbidities: arthritis, falls, decreased balance, history of TIAs, CVA    PT Frequency  2x / week    PT Duration  6 weeks    PT Treatment/Interventions  Balance training;Therapeutic exercise;Therapeutic activities;Patient/family education;Manual techniques;Cryotherapy    PT Next Visit Plan  exercise progression, sitting, standing, gait activities    PT Home Exercise Plan  hip adduction with ball and glute sets, SLR sitting, roll ball underfoot    Consulted and Agree with Plan of Care  Patient       Patient will benefit from skilled therapeutic intervention in order to improve the following deficits and impairments:  Pain, Decreased mobility, Impaired perceived functional ability, Decreased strength, Decreased range of motion, Decreased endurance, Decreased activity tolerance, Decreased balance, Decreased safety awareness, Difficulty walking, Increased muscle spasms  Visit Diagnosis: Difficulty in walking, not elsewhere classified - Plan: PT plan of care cert/re-cert  Muscle weakness (generalized) - Plan: PT plan of care cert/re-cert  Repeated falls - Plan: PT plan of care cert/re-cert     Problem List Patient Active Problem List   Diagnosis Date Noted  . Protein-calorie malnutrition, severe 11/08/2017  . Lumbar compression fracture ([place]) 11/05/2017  . Closed fracture of proximal end of left humerus with routine healing 08/28/2016  . Acute cystitis 04/28/2016  . Chest pain 03/14/2015  . TIA (transient ischemic attack) 01/29/2015  . Allergic arthritis, hand     Jomarie Longs PT 12/03/2017, 9:33 PM  [facility] [address], [place], [address] Phone: [phone]   Fax:  [phone]  Name: NASHONDA LIMBERG MRN: [id number] Date of Birth: 25-Jul-1931

## 2017-12-06 ENCOUNTER — Ambulatory Visit: Payer: Medicare Other | Admitting: Physical Therapy

## 2017-12-06 ENCOUNTER — Encounter: Payer: Self-pay | Admitting: Physical Therapy

## 2017-12-06 DIAGNOSIS — R296 Repeated falls: Secondary | ICD-10-CM | POA: Diagnosis not present

## 2017-12-06 DIAGNOSIS — R262 Difficulty in walking, not elsewhere classified: Secondary | ICD-10-CM

## 2017-12-06 DIAGNOSIS — M6281 Muscle weakness (generalized): Secondary | ICD-10-CM

## 2017-12-06 NOTE — Therapy (Signed)
[facility] [address], [place], [address] Phone: [phone]   Fax:  [phone]  Physical Therapy Treatment  Patient Details  Name: Stephanie Harris MRN: [id number] Date of Birth: [birth year] Referring Provider: Feliberto Gottron PA   Encounter Date: 12/06/2017  PT End of Session - 12/06/17 1703    Visit Number  2    Number of Visits  12    Date for PT Re-Evaluation  01/13/18    Authorization Type  2 of 10 progress note    PT Start Time  1645    PT Stop Time  1730    PT Time Calculation (min)  45 min    Equipment Utilized During Treatment  Gait belt;Other (comment) patient rollator    Activity Tolerance  Patient tolerated treatment well    Behavior During Therapy  [facility] for tasks assessed/performed       Past Medical History:  Diagnosis Date  . Acute bronchiolitis   . Acute pharyngitis   . Allergic rhinitis due to pollen   . Anemia, unspecified    CKD and LGL  . Anxiety state    unspecified  . Asthma without status asthmaticus    unspecified  . B12 deficiency   . Beta thalassemia trait   . Carotid artery occlusion   . Cellulitis and abscess of leg, except foot   . Cervical spondylosis   . Cervicalgia   . Chronic kidney disease   . CML (chronic myelocytic leukemia) ([place])    Onc at [facility]  . Coronary artery disease 2009   heart attack with stent  . Coronary atherosclerosis of native coronary artery   . Depressive disorder    not elsewhere classified  . Diabetes mellitus type 2, uncomplicated ([place])   . Difficulty in walking   . Disorder of breast, unspecified   . Dizziness and giddiness   . Dysuria   . Esophageal reflux   . Essential hypertension    unspecified  . Head injury    unspecified  . Heart disease   . Hematuria, unspecified   . Hyperlipidemia, unspecified   . Hypersomnia, unspecified   . Hypertension   . Hypopotassemia   . Hypothyroidism    unspecified  . ICH  (intracerebral hemorrhage) ([place])   . Ill-defined cerebrovascular disease    other  . Insomnia    unspecified  . 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 Leukemia ([place])   . Lower urinary tract infection   . Lumbago   . Mini stroke ([place])    x years  . Mixed hyperlipidemia   . Nontoxic nodular goiter    unspecified  . Occlusion and stenosis of unspecified carotid artery    without mention of cerebral infarction  . Osteoarthritis   . Osteoporosis   . Other constipation   . Other vitamin B12 deficiency anemias   . Otitis media, unspecified, unspecified ear   . Ovarian failure    unspecified  . Pain in limb   . Panic disorder without agoraphobia   . Peripheral vascular disease ([place])    unspecified  . Phlebitis of left arm   . RA (rheumatoid arthritis) ([place])   . Sigmoid polyp 1998  . Sleep disturbance    unspecified  . Stroke ([place])   . Syncope and collapse   . Thalassemia   . TIA (transient ischemic attack)    2000 and 2008 right carotid stent 08/14/2009 on Plavix  . Transient  disorder of initiating or maintaining sleep   . Varicose veins of bilateral lower extremities with other complications   . Varicose veins of bilateral lower extremities with other complications   . Vision abnormalities     Past Surgical History:  Procedure Laterality Date  . BACK SURGERY    . CARDIAC CATHETERIZATION    . CAROTID ENDARTERECTOMY Left   . CATARACT EXTRACTION    . CHOLECYSTECTOMY  1980  . COLONOSCOPY  2010  . EYELID SURGERY Bilateral 2005   BUL BLEPH  . FRACTURE SURGERY    . JOINT REPLACEMENT    . KYPHOPLASTY N/A 08/12/2017   Procedure: KYPHOPLASTY;  Surgeon: Hessie Knows, MD;  Location: [facility] ORS;  Service: Orthopedics;  Laterality: N/A;  . KYPHOPLASTY N/A 11/08/2017   Procedure: Hewitt Shorts;  Surgeon: Hessie Knows, MD;  Location: [facility] ORS;  Service: Orthopedics;  Laterality: N/A;  . LENS EYE SURGERY Left 06/16/2001   +23.5D  . LENS EYE SURGERY Right 05/28/2003    22.5D  . PERCUTANEOUS CORONARY STENT INTERVENTION (PCI-S)    . PERCUTANEOUS PLACEMENT INTRAVASCULAR STENT CERVICAL CAROTID ARTERY  06/14/2009  . ROTATOR CUFF REPAIR Left   . SUPRACERVICAL ABDOMINAL HYSTERECTOMY  1978   WITH REMOVAL TUBES &/OR OVARIES  . TOTAL KNEE ARTHROPLASTY Right     There were no vitals filed for this visit.  Subjective Assessment - 12/06/17 1701    Subjective  Patient reports her back pain is what limits her with moving.     Pertinent History  Patient fell at home 11/04/17 injuring back and underwent kyphoplasty L2 on 11/08/2017. She has history of chronic myelocytic leukemia, HTN, osteoarthritis, RA, osterporosis, TIAs and CVA. She has had multple falls at home when not using AD.     Limitations  Standing;Walking;Sitting    How long can you sit comfortably?  short periods of time    How long can you stand comfortably?  <10 min    How long can you walk comfortably?  short distances with rollator    Patient Stated Goals  decrease back pain and improve walking to reduce falls    Currently in Pain?  Yes    Pain Score  7     Pain Location  Back    Pain Orientation  Lower    Pain Descriptors / Indicators  Aching;Sore    Pain Type  Acute pain;Surgical pain 11/08/2017    Pain Onset  1 to 4 weeks ago    Pain Frequency  Constant       Objective: Gait: ambulating independently with Rolator slow cadence   Treatment: Therapeutic exercise: patient performed with instruction, demonstration, verbal and tactile cueing of therapist: goal: independent with home program, improve ambulation, strength  Sitting: knee flexion/extension with 2# ankle weights, rolling 2# ball under foot 2 x 15 reps hip flexion with 2# weight on thigh x 10, no weight x 10 hip abduction with mild manual resistance x 15 reps ankle DF with yellow resistive band x 15 ankle PF off balance stones with 2# weights on thighs x 15 reps reach and scapular retraction x 10 straight arm pull downs with  yellow resistive band x 15 reps  Modalities: Electrical stimulation: 30 min High volt estim.clincial program for muscle spasms  (4) electrodes applied to lumbar spine intensity to tolerance with patient seated in chair with back supported, ice pack applied to lower back  goal: pain, spasms; no adverse reactions noted  Patient response to treatment: patient demonstrated improved technique with exercises with  moderate VC for correct alignment. Patient with decreased pain from  7 /10 to 5/10.      PT Education - 12/06/17 1702    Education provided  Yes    Education Details  HEP re assessed     Person(s) Educated  Patient    Methods  Explanation;Demonstration;Verbal cues    Comprehension  Verbalized understanding;Returned demonstration;Verbal cues required          PT Long Term Goals - 12/02/17 1914      PT LONG TERM GOAL #1   Title  Patient will complete a TUG test in <20 seconds with an AD for independent mobility and decreased fall risk     Baseline  37 seconds with rollator    Status  New    Target Date  12/23/17      PT LONG TERM GOAL #2   Title  Patient  will complete 5x/sit to stand test in < 25 seconds indicating improved strength and decreased fall risk    Status  New    Target Date  12/30/17      PT LONG TERM GOAL #3   Title  improved 10Mw test iin 15 seconds with rollator and close supervision demonstrating improvement with ambulation and decreased fall risk    Baseline  10MW 25.5 seconds (.39 m/s)    Status  New    Target Date  01/13/18      PT LONG TERM GOAL #4   Title  Patient will be independent with home program for strength and balance activities to allow transition to self management     Baseline  requries instruction, guidance, cuing for exercises    Status  New    Target Date  01/13/18            Plan - 12/06/17 1704    Clinical Impression Statement  Patient responded well to estim. for pain control with pain decreased to 5/10. She required  moderate cuing to perform exercises. She continues with decreased strength and endurance for walking and daily tasks and will benefit from continues physical therapy intervention to achieve goals     Rehab Potential  Good    Clinical Impairments Affecting Rehab Potential  (+)motivated, acute condition(-)multiple comorbidities: arthritis, falls, decreased balance, history of TIAs, CVA    PT Frequency  2x / week    PT Duration  6 weeks    PT Treatment/Interventions  Balance training;Therapeutic exercise;Therapeutic activities;Patient/family education;Manual techniques;Cryotherapy;Electrical Stimulation    PT Next Visit Plan  exercise progression, sitting, standing, gait activities    PT Home Exercise Plan  hip adduction with ball and glute sets, SLR sitting, roll ball underfoot       Patient will benefit from skilled therapeutic intervention in order to improve the following deficits and impairments:  Pain, Decreased mobility, Impaired perceived functional ability, Decreased strength, Decreased range of motion, Decreased endurance, Decreased activity tolerance, Decreased balance, Decreased safety awareness, Difficulty walking, Increased muscle spasms  Visit Diagnosis: Difficulty in walking, not elsewhere classified  Muscle weakness (generalized)  Repeated falls     Problem List Patient Active Problem List   Diagnosis Date Noted  . Protein-calorie malnutrition, severe 11/08/2017  . Lumbar compression fracture ([place]) 11/05/2017  . Closed fracture of proximal end of left humerus with routine healing 08/28/2016  . Acute cystitis 04/28/2016  . Chest pain 03/14/2015  . TIA (transient ischemic attack) 01/29/2015  . Allergic arthritis, hand     Jomarie Longs PT 12/06/2017, 10:44 PM  [facility] [address], [place], [address] Phone: [phone]   Fax:  [phone]  Name: Stephanie Harris MRN: [id number] Date of  Birth: [birth year]

## 2017-12-15 ENCOUNTER — Ambulatory Visit: Payer: Medicare Other | Admitting: Physical Therapy

## 2017-12-15 ENCOUNTER — Encounter: Payer: Self-pay | Admitting: Physical Therapy

## 2017-12-15 DIAGNOSIS — R262 Difficulty in walking, not elsewhere classified: Secondary | ICD-10-CM | POA: Diagnosis not present

## 2017-12-15 DIAGNOSIS — R296 Repeated falls: Secondary | ICD-10-CM | POA: Diagnosis not present

## 2017-12-15 DIAGNOSIS — M6281 Muscle weakness (generalized): Secondary | ICD-10-CM

## 2017-12-15 NOTE — Therapy (Signed)
[facility] [address] [place], [address] Phone: [phone]   Fax:  [phone]  Physical Therapy Treatment  Patient Details  Name: Stephanie Harris MRN: [id number] Date of Birth: 11/30/1930 Referring Provider: Feliberto Gottron PA   Encounter Date: 12/15/2017  PT End of Session - 12/15/17 1523    Visit Number  3    Number of Visits  12    Date for PT Re-Evaluation  01/13/18    Authorization Type  3 of 10 progress note    PT Start Time  2458    PT Stop Time  1600    PT Time Calculation (min)  43 min    Equipment Utilized During Treatment  Gait belt;Other (comment) patient rollator    Activity Tolerance  Patient tolerated treatment well    Behavior During Therapy  [facility] for tasks assessed/performed       Past Medical History:  Diagnosis Date  . Acute bronchiolitis   . Acute pharyngitis   . Allergic rhinitis due to pollen   . Anemia, unspecified    CKD and LGL  . Anxiety state    unspecified  . Asthma without status asthmaticus    unspecified  . B12 deficiency   . Beta thalassemia trait   . Carotid artery occlusion   . Cellulitis and abscess of leg, except foot   . Cervical spondylosis   . Cervicalgia   . Chronic kidney disease   . CML (chronic myelocytic leukemia) ([place])    Onc at [facility]  . Coronary artery disease 2009   heart attack with stent  . Coronary atherosclerosis of native coronary artery   . Depressive disorder    not elsewhere classified  . Diabetes mellitus type 2, uncomplicated ([place])   . Difficulty in walking   . Disorder of breast, unspecified   . Dizziness and giddiness   . Dysuria   . Esophageal reflux   . Essential hypertension    unspecified  . Head injury    unspecified  . Heart disease   . Hematuria, unspecified   . Hyperlipidemia, unspecified   . Hypersomnia, unspecified   . Hypertension   . Hypopotassemia   . Hypothyroidism    unspecified  . ICH  (intracerebral hemorrhage) ([place])   . Ill-defined cerebrovascular disease    other  . Insomnia    unspecified  . 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 Leukemia ([place])   . Lower urinary tract infection   . Lumbago   . Mini stroke ([place])    x years  . Mixed hyperlipidemia   . Nontoxic nodular goiter    unspecified  . Occlusion and stenosis of unspecified carotid artery    without mention of cerebral infarction  . Osteoarthritis   . Osteoporosis   . Other constipation   . Other vitamin B12 deficiency anemias   . Otitis media, unspecified, unspecified ear   . Ovarian failure    unspecified  . Pain in limb   . Panic disorder without agoraphobia   . Peripheral vascular disease ([place])    unspecified  . Phlebitis of left arm   . RA (rheumatoid arthritis) ([place])   . Sigmoid polyp 1998  . Sleep disturbance    unspecified  . Stroke ([place])   . Syncope and collapse   . Thalassemia   . TIA (transient ischemic attack)    2000 and 2008 right carotid stent 08/14/2009 on Plavix  . Transient  disorder of initiating or maintaining sleep   . Varicose veins of bilateral lower extremities with other complications   . Varicose veins of bilateral lower extremities with other complications   . Vision abnormalities     Past Surgical History:  Procedure Laterality Date  . BACK SURGERY    . CARDIAC CATHETERIZATION    . CAROTID ENDARTERECTOMY Left   . CATARACT EXTRACTION    . CHOLECYSTECTOMY  1980  . COLONOSCOPY  2010  . EYELID SURGERY Bilateral 2005   BUL BLEPH  . FRACTURE SURGERY    . JOINT REPLACEMENT    . KYPHOPLASTY N/A 08/12/2017   Procedure: KYPHOPLASTY;  Surgeon: Hessie Knows, MD;  Location: [facility] ORS;  Service: Orthopedics;  Laterality: N/A;  . KYPHOPLASTY N/A 11/08/2017   Procedure: Hewitt Shorts;  Surgeon: Hessie Knows, MD;  Location: [facility] ORS;  Service: Orthopedics;  Laterality: N/A;  . LENS EYE SURGERY Left 06/16/2001   +23.5D  . LENS EYE SURGERY Right 05/28/2003    22.5D  . PERCUTANEOUS CORONARY STENT INTERVENTION (PCI-S)    . PERCUTANEOUS PLACEMENT INTRAVASCULAR STENT CERVICAL CAROTID ARTERY  06/14/2009  . ROTATOR CUFF REPAIR Left   . SUPRACERVICAL ABDOMINAL HYSTERECTOMY  1978   WITH REMOVAL TUBES &/OR OVARIES  . TOTAL KNEE ARTHROPLASTY Right     There were no vitals filed for this visit.  Subjective Assessment - 12/15/17 1523    Subjective  Patient reports her back pain continues to be primary concern and she is having difficulty with putting on back brace.     Pertinent History  Patient fell at home 11/04/17 injuring back and underwent kyphoplasty L2 on 11/08/2017. She has history of chronic myelocytic leukemia, HTN, osteoarthritis, RA, osterporosis, TIAs and CVA. She has had multple falls at home when not using AD.     Limitations  Standing;Walking;Sitting    How long can you sit comfortably?  short periods of time    How long can you stand comfortably?  <10 min    How long can you walk comfortably?  short distances with rollator    Patient Stated Goals  decrease back pain and improve walking to reduce falls    Currently in Pain?  Yes    Pain Score  7     Pain Location  Back    Pain Orientation  Lower    Pain Descriptors / Indicators  Aching;Sore    Pain Type  Surgical pain;Acute pain 11/08/2017    Pain Onset  1 to 4 weeks ago    Pain Frequency  Constant        Objective: Gait: ambulating independently with Rolator slow cadence, improved posture more erect from previous session   Treatment: Therapeutic exercise: patient performed with instruction, demonstration, verbal and tactile cueing of therapist: goal: independent with home program, improve ambulation, strength   Sitting: knee flexion/extension with 2# ankle weights, rolling 2# ball under foot 2 x 15 reps hip flexion with 2# weight on thigh x 10, no weight x 10 hip abduction with mild manual resistance x 15 reps ankle DF with yellow resistive band x 15 ankle PF off balance  stones with 2# weights on thighs x 15 reps scapular retraction 2 x 10 with yellow resistive band  straight arm pull downs with yellow resistive band 2 x 15 reps   Modalities: Electrical stimulation: 20 min High volt estim.clincial program for muscle spasms  (4) electrodes applied to lumbar spine intensity to tolerance with patient seated in chair with back supported goal: pain,  spasms; no adverse reactions noted   Patient response to treatment: patient demonstrated improved technique with exercises with moderate VC for correct alignment. Patient with decreased pain from  7 /10 to 5/10.      PT Education - 12/15/17 1600    Education provided  Yes    Education Details  back brace assessed and educated patient in possibly donning while lying down; exercise instruction    Person(s) Educated  Patient    Methods  Explanation;Demonstration;Verbal cues    Comprehension  Verbalized understanding;Verbal cues required          PT Long Term Goals - 12/02/17 1914      PT LONG TERM GOAL #1   Title  Patient will complete a TUG test in <20 seconds with an AD for independent mobility and decreased fall risk     Baseline  37 seconds with rollator    Status  New    Target Date  12/23/17      PT LONG TERM GOAL #2   Title  Patient  will complete 5x/sit to stand test in < 25 seconds indicating improved strength and decreased fall risk    Status  New    Target Date  12/30/17      PT LONG TERM GOAL #3   Title  improved 10Mw test iin 15 seconds with rollator and close supervision demonstrating improvement with ambulation and decreased fall risk    Baseline  10MW 25.5 seconds (.39 m/s)    Status  New    Target Date  01/13/18      PT LONG TERM GOAL #4   Title  Patient will be independent with home program for strength and balance activities to allow transition to self management     Baseline  requries instruction, guidance, cuing for exercises    Status  New    Target Date  01/13/18             Plan - 12/15/17 1600    Clinical Impression Statement  Patient demonstrated improved technqiue with exercises with minimal cuing. pain in lower back decreased with patient able to transfer sit to stand with less difficulty at end of session and patient stood with more erect posture following session.     Rehab Potential  Good    Clinical Impairments Affecting Rehab Potential  (+)motivated, acute condition(-)multiple comorbidities: arthritis, falls, decreased balance, history of TIAs, CVA    PT Frequency  2x / week    PT Duration  6 weeks    PT Treatment/Interventions  Balance training;Therapeutic exercise;Therapeutic activities;Patient/family education;Manual techniques;Cryotherapy;Electrical Stimulation    PT Next Visit Plan  exercise progression, sitting, standing, gait activities    PT Home Exercise Plan  hip adduction with ball and glute sets, SLR sitting, roll ball underfoot       Patient will benefit from skilled therapeutic intervention in order to improve the following deficits and impairments:  Pain, Decreased mobility, Impaired perceived functional ability, Decreased strength, Decreased range of motion, Decreased endurance, Decreased activity tolerance, Decreased balance, Decreased safety awareness, Difficulty walking, Increased muscle spasms  Visit Diagnosis: Difficulty in walking, not elsewhere classified  Muscle weakness (generalized)  Repeated falls     Problem List Patient Active Problem List   Diagnosis Date Noted  . Protein-calorie malnutrition, severe 11/08/2017  . Lumbar compression fracture ([place]) 11/05/2017  . Closed fracture of proximal end of left humerus with routine healing 08/28/2016  . Acute cystitis 04/28/2016  . Chest pain 03/14/2015  . TIA (  transient ischemic attack) 01/29/2015  . Allergic arthritis, hand     Jomarie Longs PT 12/16/2017, 10:09 PM  [facility] [address], [place], [address] Phone: [phone]   Fax:  [phone]  Name: Stephanie Harris MRN: [id number] Date of Birth: 02/09/1931

## 2017-12-16 ENCOUNTER — Encounter: Payer: Medicare Other | Admitting: Physical Therapy

## 2017-12-16 DIAGNOSIS — C91Z0 Other lymphoid leukemia not having achieved remission: Secondary | ICD-10-CM | POA: Diagnosis not present

## 2017-12-16 DIAGNOSIS — M542 Cervicalgia: Secondary | ICD-10-CM | POA: Diagnosis not present

## 2017-12-16 DIAGNOSIS — S32020D Wedge compression fracture of second lumbar vertebra, subsequent encounter for fracture with routine healing: Secondary | ICD-10-CM | POA: Diagnosis not present

## 2017-12-16 DIAGNOSIS — D631 Anemia in chronic kidney disease: Secondary | ICD-10-CM | POA: Diagnosis not present

## 2017-12-16 DIAGNOSIS — M549 Dorsalgia, unspecified: Secondary | ICD-10-CM | POA: Diagnosis not present

## 2017-12-16 DIAGNOSIS — M4856XD Collapsed vertebra, not elsewhere classified, lumbar region, subsequent encounter for fracture with routine healing: Secondary | ICD-10-CM | POA: Diagnosis not present

## 2017-12-16 DIAGNOSIS — N183 Chronic kidney disease, stage 3 (moderate): Secondary | ICD-10-CM | POA: Diagnosis not present

## 2017-12-16 DIAGNOSIS — Z9889 Other specified postprocedural states: Secondary | ICD-10-CM | POA: Diagnosis not present

## 2017-12-16 DIAGNOSIS — D630 Anemia in neoplastic disease: Secondary | ICD-10-CM | POA: Diagnosis not present

## 2017-12-21 ENCOUNTER — Encounter: Payer: Self-pay | Admitting: Physical Therapy

## 2017-12-21 ENCOUNTER — Ambulatory Visit: Payer: Medicare Other | Admitting: Physical Therapy

## 2017-12-21 DIAGNOSIS — M6281 Muscle weakness (generalized): Secondary | ICD-10-CM

## 2017-12-21 DIAGNOSIS — R262 Difficulty in walking, not elsewhere classified: Secondary | ICD-10-CM

## 2017-12-21 DIAGNOSIS — R296 Repeated falls: Secondary | ICD-10-CM

## 2017-12-21 NOTE — Therapy (Signed)
[facility] [address], [place], [address] Phone: [phone]   Fax:  [phone]  Physical Therapy Treatment  Patient Details  Name: Stephanie Harris MRN: [id number] Date of Birth: 28-Jul-1931 Referring Provider: Feliberto Gottron PA   Encounter Date: 12/21/2017  PT End of Session - 12/21/17 1516    Visit Number  4    Number of Visits  12    Date for PT Re-Evaluation  01/13/18    Authorization Type  4 of 10 progress note    PT Start Time  1510    PT Stop Time  1553    PT Time Calculation (min)  43 min    Equipment Utilized During Treatment  Gait belt;Other (comment) patient rollator    Activity Tolerance  Patient tolerated treatment well    Behavior During Therapy  [facility] for tasks assessed/performed       Past Medical History:  Diagnosis Date  . Acute bronchiolitis   . Acute pharyngitis   . Allergic rhinitis due to pollen   . Anemia, unspecified    CKD and LGL  . Anxiety state    unspecified  . Asthma without status asthmaticus    unspecified  . B12 deficiency   . Beta thalassemia trait   . Carotid artery occlusion   . Cellulitis and abscess of leg, except foot   . Cervical spondylosis   . Cervicalgia   . Chronic kidney disease   . CML (chronic myelocytic leukemia) ([place])    Onc at [facility]  . Coronary artery disease 2009   heart attack with stent  . Coronary atherosclerosis of native coronary artery   . Depressive disorder    not elsewhere classified  . Diabetes mellitus type 2, uncomplicated ([place])   . Difficulty in walking   . Disorder of breast, unspecified   . Dizziness and giddiness   . Dysuria   . Esophageal reflux   . Essential hypertension    unspecified  . Head injury    unspecified  . Heart disease   . Hematuria, unspecified   . Hyperlipidemia, unspecified   . Hypersomnia, unspecified   . Hypertension   . Hypopotassemia   . Hypothyroidism    unspecified  . ICH  (intracerebral hemorrhage) ([place])   . Ill-defined cerebrovascular disease    other  . Insomnia    unspecified  . 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 Leukemia ([place])   . Lower urinary tract infection   . Lumbago   . Mini stroke ([place])    x years  . Mixed hyperlipidemia   . Nontoxic nodular goiter    unspecified  . Occlusion and stenosis of unspecified carotid artery    without mention of cerebral infarction  . Osteoarthritis   . Osteoporosis   . Other constipation   . Other vitamin B12 deficiency anemias   . Otitis media, unspecified, unspecified ear   . Ovarian failure    unspecified  . Pain in limb   . Panic disorder without agoraphobia   . Peripheral vascular disease ([place])    unspecified  . Phlebitis of left arm   . RA (rheumatoid arthritis) ([place])   . Sigmoid polyp 1998  . Sleep disturbance    unspecified  . Stroke ([place])   . Syncope and collapse   . Thalassemia   . TIA (transient ischemic attack)    2000 and 2008 right carotid stent 08/14/2009 on Plavix  . Transient  disorder of initiating or maintaining sleep   . Varicose veins of bilateral lower extremities with other complications   . Varicose veins of bilateral lower extremities with other complications   . Vision abnormalities     Past Surgical History:  Procedure Laterality Date  . BACK SURGERY    . CARDIAC CATHETERIZATION    . CAROTID ENDARTERECTOMY Left   . CATARACT EXTRACTION    . CHOLECYSTECTOMY  1980  . COLONOSCOPY  2010  . EYELID SURGERY Bilateral 2005   BUL BLEPH  . FRACTURE SURGERY    . JOINT REPLACEMENT    . KYPHOPLASTY N/A 08/12/2017   Procedure: KYPHOPLASTY;  Surgeon: Hessie Knows, MD;  Location: [facility] ORS;  Service: Orthopedics;  Laterality: N/A;  . KYPHOPLASTY N/A 11/08/2017   Procedure: Hewitt Shorts;  Surgeon: Hessie Knows, MD;  Location: [facility] ORS;  Service: Orthopedics;  Laterality: N/A;  . LENS EYE SURGERY Left 06/16/2001   +23.5D  . LENS EYE SURGERY Right 05/28/2003    22.5D  . PERCUTANEOUS CORONARY STENT INTERVENTION (PCI-S)    . PERCUTANEOUS PLACEMENT INTRAVASCULAR STENT CERVICAL CAROTID ARTERY  06/14/2009  . ROTATOR CUFF REPAIR Left   . SUPRACERVICAL ABDOMINAL HYSTERECTOMY  1978   WITH REMOVAL TUBES &/OR OVARIES  . TOTAL KNEE ARTHROPLASTY Right     There were no vitals filed for this visit.  Subjective Assessment - 12/21/17 1514    Subjective  Patient is having back pain and is still not able to do much at home. She is doing quite a bit at home and she is not supposed to lift more than 5#. She is going to be receiving a life line pendent and assistance at home for helping her care for self and her home.     Pertinent History  Patient fell at home 11/04/17 injuring back and underwent kyphoplasty L2 on 11/08/2017. She has history of chronic myelocytic leukemia, HTN, osteoarthritis, RA, osterporosis, TIAs and CVA. She has had multple falls at home when not using AD.     Limitations  Standing;Walking;Sitting    How long can you sit comfortably?  short periods of time    How long can you stand comfortably?  <10 min    How long can you walk comfortably?  short distances with rollator    Patient Stated Goals  decrease back pain and improve walking to reduce falls    Currently in Pain?  Yes    Pain Score  6     Pain Location  Back    Pain Orientation  Right;Left;Lower    Pain Descriptors / Indicators  Aching;Sore;Spasm    Pain Type  Surgical pain 11/08/2017    Pain Onset  More than a month ago    Pain Frequency  Constant         Objective: Gait: independent with rollator; slow cadence palpation: hypersensitive bilateral lumbar spine region paraspinal muscles with spasms palpable left side    Treatment: Manual therapy: 13 min sitting in chair: STM to thoracic and lumbar spine superficial techniques for spasms and decreasing pain; focus on left side lumbar spine   Therapeutic exercise: patient performed with instruction, demonstration, verbal and  tactile cueing of therapist: goal: independent with home program, improve ambulation, strength   Sitting: hip abduction with yellow resistive band  x 15 reps knee flexion bilateral 2 x 15 with yellow resistive band and assist of therapist  knee extension with 2# ankle weights: tap balance stones 2 x 10 each LE  scapular retraction 2 x  15 with yellow resistive band  straight arm pull downs with yellow resistive band 2 x 15 reps    Patient response to treatment: patient demonstrated improved technique with exercises with moderate VC for correct alignment. improved soft tissue elasticity with decreased spasms by up to 50% following STM     PT Education - 12/21/17 1618    Education provided  Yes    Education Details  exercise instruction for technique    Person(s) Educated  Patient    Methods  Explanation;Demonstration;Verbal cues    Comprehension  Verbal cues required;Returned demonstration;Verbalized understanding          PT Long Term Goals - 12/02/17 1914      PT LONG TERM GOAL #1   Title  Patient will complete a TUG test in <20 seconds with an AD for independent mobility and decreased fall risk     Baseline  37 seconds with rollator    Status  New    Target Date  12/23/17      PT LONG TERM GOAL #2   Title  Patient  will complete 5x/sit to stand test in < 25 seconds indicating improved strength and decreased fall risk    Status  New    Target Date  12/30/17      PT LONG TERM GOAL #3   Title  improved 10Mw test iin 15 seconds with rollator and close supervision demonstrating improvement with ambulation and decreased fall risk    Baseline  10MW 25.5 seconds (.39 m/s)    Status  New    Target Date  01/13/18      PT LONG TERM GOAL #4   Title  Patient will be independent with home program for strength and balance activities to allow transition to self management     Baseline  requries instruction, guidance, cuing for exercises    Status  New    Target Date  01/13/18             Plan - 12/21/17 1619    Clinical Impression Statement  Patient continues with decreased endurance and strength and requires assistance and moderat cuing to perform exercises. She will benefit from continued physical therapy intervention to achieve goals.     Rehab Potential  Good    Clinical Impairments Affecting Rehab Potential  (+)motivated, acute condition(-)multiple comorbidities: arthritis, falls, decreased balance, history of TIAs, CVA    PT Frequency  2x / week    PT Duration  6 weeks    PT Treatment/Interventions  Balance training;Therapeutic exercise;Therapeutic activities;Patient/family education;Manual techniques;Cryotherapy;Electrical Stimulation    PT Next Visit Plan  exercise progression, sitting, standing, gait activities    PT Home Exercise Plan  hip adduction with ball and glute sets, SLR sitting, roll ball underfoot       Patient will benefit from skilled therapeutic intervention in order to improve the following deficits and impairments:  Pain, Decreased mobility, Impaired perceived functional ability, Decreased strength, Decreased range of motion, Decreased endurance, Decreased activity tolerance, Decreased balance, Decreased safety awareness, Difficulty walking, Increased muscle spasms  Visit Diagnosis: Difficulty in walking, not elsewhere classified  Muscle weakness (generalized)  Repeated falls     Problem List Patient Active Problem List   Diagnosis Date Noted  . Protein-calorie malnutrition, severe 11/08/2017  . Lumbar compression fracture ([place]) 11/05/2017  . Closed fracture of proximal end of left humerus with routine healing 08/28/2016  . Acute cystitis 04/28/2016  . Chest pain 03/14/2015  . TIA (transient ischemic attack) 01/29/2015  .  Allergic arthritis, hand     Jomarie Longs PT 12/21/2017, 4:22 PM  [place] [address], [place], [address] Phone:  [phone]   Fax:  [phone]  Name: Stephanie Harris MRN: [id number] Date of Birth: 06-07-1931

## 2017-12-23 ENCOUNTER — Ambulatory Visit: Payer: Medicare Other | Attending: Orthopedic Surgery | Admitting: Physical Therapy

## 2017-12-23 ENCOUNTER — Encounter: Payer: Self-pay | Admitting: Physical Therapy

## 2017-12-23 DIAGNOSIS — R262 Difficulty in walking, not elsewhere classified: Secondary | ICD-10-CM | POA: Diagnosis not present

## 2017-12-23 DIAGNOSIS — M6281 Muscle weakness (generalized): Secondary | ICD-10-CM

## 2017-12-23 DIAGNOSIS — R296 Repeated falls: Secondary | ICD-10-CM | POA: Diagnosis not present

## 2017-12-23 NOTE — Therapy (Signed)
[facility] [address], [place], [address] Phone: [phone]   Fax:  [phone]  Physical Therapy Treatment  Patient Details  Name: Stephanie Harris MRN: [id number] Date of Birth: [birth year] Referring Provider: Feliberto Gottron PA   Encounter Date: 12/23/2017  PT End of Session - 12/23/17 1440    Visit Number  5    Number of Visits  12    Date for PT Re-Evaluation  01/13/18    Authorization Type  5 of 10 progress note    PT Start Time  1435    PT Stop Time  1517    PT Time Calculation (min)  42 min    Equipment Utilized During Treatment  Gait belt;Other (comment) patient rollator    Activity Tolerance  Patient tolerated treatment well    Behavior During Therapy  [facility] for tasks assessed/performed       Past Medical History:  Diagnosis Date  . Acute bronchiolitis   . Acute pharyngitis   . Allergic rhinitis due to pollen   . Anemia, unspecified    CKD and LGL  . Anxiety state    unspecified  . Asthma without status asthmaticus    unspecified  . B12 deficiency   . Beta thalassemia trait   . Carotid artery occlusion   . Cellulitis and abscess of leg, except foot   . Cervical spondylosis   . Cervicalgia   . Chronic kidney disease   . CML (chronic myelocytic leukemia) ([place])    Onc at [facility]  . Coronary artery disease 2009   heart attack with stent  . Coronary atherosclerosis of native coronary artery   . Depressive disorder    not elsewhere classified  . Diabetes mellitus type 2, uncomplicated ([place])   . Difficulty in walking   . Disorder of breast, unspecified   . Dizziness and giddiness   . Dysuria   . Esophageal reflux   . Essential hypertension    unspecified  . Head injury    unspecified  . Heart disease   . Hematuria, unspecified   . Hyperlipidemia, unspecified   . Hypersomnia, unspecified   . Hypertension   . Hypopotassemia   . Hypothyroidism    unspecified  . ICH  (intracerebral hemorrhage) ([place])   . Ill-defined cerebrovascular disease    other  . Insomnia    unspecified  . 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 Leukemia ([place])   . Lower urinary tract infection   . Lumbago   . Mini stroke ([place])    x years  . Mixed hyperlipidemia   . Nontoxic nodular goiter    unspecified  . Occlusion and stenosis of unspecified carotid artery    without mention of cerebral infarction  . Osteoarthritis   . Osteoporosis   . Other constipation   . Other vitamin B12 deficiency anemias   . Otitis media, unspecified, unspecified ear   . Ovarian failure    unspecified  . Pain in limb   . Panic disorder without agoraphobia   . Peripheral vascular disease ([place])    unspecified  . Phlebitis of left arm   . RA (rheumatoid arthritis) ([place])   . Sigmoid polyp 1998  . Sleep disturbance    unspecified  . Stroke ([place])   . Syncope and collapse   . Thalassemia   . TIA (transient ischemic attack)    2000 and 2008 right carotid stent 08/14/2009 on Plavix  . Transient  disorder of initiating or maintaining sleep   . Varicose veins of bilateral lower extremities with other complications   . Varicose veins of bilateral lower extremities with other complications   . Vision abnormalities     Past Surgical History:  Procedure Laterality Date  . BACK SURGERY    . CARDIAC CATHETERIZATION    . CAROTID ENDARTERECTOMY Left   . CATARACT EXTRACTION    . CHOLECYSTECTOMY  1980  . COLONOSCOPY  2010  . EYELID SURGERY Bilateral 2005   BUL BLEPH  . FRACTURE SURGERY    . JOINT REPLACEMENT    . KYPHOPLASTY N/A 08/12/2017   Procedure: KYPHOPLASTY;  Surgeon: Hessie Knows, MD;  Location: [facility] ORS;  Service: Orthopedics;  Laterality: N/A;  . KYPHOPLASTY N/A 11/08/2017   Procedure: Hewitt Shorts;  Surgeon: Hessie Knows, MD;  Location: [facility] ORS;  Service: Orthopedics;  Laterality: N/A;  . LENS EYE SURGERY Left 06/16/2001   +23.5D  . LENS EYE SURGERY Right 05/28/2003    22.5D  . PERCUTANEOUS CORONARY STENT INTERVENTION (PCI-S)    . PERCUTANEOUS PLACEMENT INTRAVASCULAR STENT CERVICAL CAROTID ARTERY  06/14/2009  . ROTATOR CUFF REPAIR Left   . SUPRACERVICAL ABDOMINAL HYSTERECTOMY  1978   WITH REMOVAL TUBES &/OR OVARIES  . TOTAL KNEE ARTHROPLASTY Right     There were no vitals filed for this visit.  Subjective Assessment - 12/23/17 1438    Subjective  Patient reports her back pain is increased today and she did a lot of cooking yesterday and she had to move a chair yesterday.  Patient reports left shoulder is hurting today due to moving chair and cooking   Pertinent History  Patient fell at home 11/04/17 injuring back and underwent kyphoplasty L2 on 11/08/2017. She has history of chronic myelocytic leukemia, HTN, osteoarthritis, RA, osterporosis, TIAs and CVA. She has had multple falls at home when not using AD.     Limitations  Standing;Walking;Sitting    How long can you sit comfortably?  short periods of time    How long can you stand comfortably?  <10 min    How long can you walk comfortably?  short distances with rollator    Patient Stated Goals  decrease back pain and improve walking to reduce falls    Currently in Pain?  Yes    Pain Score  10-Worst pain ever    Pain Location  Back    Pain Orientation  Right;Left;Mid    Pain Descriptors / Indicators  Aching;Sore;Spasm    Pain Type  Surgical pain 11/08/2017    Pain Onset  More than a month ago    Pain Frequency  Constant          Objective: Gait: independent with rollator; slow cadence palpation: hypersensitive bilateral lumbar spine region paraspinal muscles     Treatment: Manual therapy: 13 min sitting in chair: STM to thoracic and lumbar spine superficial techniques for spasms and decreasing pain; focus on left side lumbar spine    Therapeutic exercise: patient performed with instruction, demonstration, verbal and tactile cueing of therapist: goal: independent with home program, improve  ambulation, strength   Sitting: hip abduction with yellow resistive band  x 15 reps knee flexion bilateral 2 x 15 with yellow resistive band and assist of therapist  knee extension with 2# ankle weights: tap balance stones 2 x 15 each LE Assisted patient with donning back brace at end of session with patient standing at rollator   Assisted patient to car with transfer stand pivot sit  then bring LE's into car.    Patient response to treatment: Patient limited in UE exercises due to pain left shoulder. She required assistance and moderate verbal cuing to perform exercises with correct technique.      PT Education - 12/23/17 1440    Education provided  Yes    Education Details  exercise instruction     Person(s) Educated  Patient    Methods  Explanation;Demonstration;Verbal cues    Comprehension  Verbalized understanding;Returned demonstration;Verbal cues required          PT Long Term Goals - 12/02/17 1914      PT LONG TERM GOAL #1   Title  Patient will complete a TUG test in <20 seconds with an AD for independent mobility and decreased fall risk     Baseline  37 seconds with rollator    Status  New    Target Date  12/23/17      PT LONG TERM GOAL #2   Title  Patient  will complete 5x/sit to stand test in < 25 seconds indicating improved strength and decreased fall risk    Status  New    Target Date  12/30/17      PT LONG TERM GOAL #3   Title  improved 10Mw test iin 15 seconds with rollator and close supervision demonstrating improvement with ambulation and decreased fall risk    Baseline  10MW 25.5 seconds (.39 m/s)    Status  New    Target Date  01/13/18      PT LONG TERM GOAL #4   Title  Patient will be independent with home program for strength and balance activities to allow transition to self management     Baseline  requries instruction, guidance, cuing for exercises    Status  New    Target Date  01/13/18            Plan - 12/23/17 1520    Clinical  Impression Statement  Patient continues with pain in back that limit function with daily tasks. She is limited in ability with lifting, moving objects due to back injury/pain and will require continues physical therapy intervention to addresss weakness and pain to improve function.     Rehab Potential  Good    Clinical Impairments Affecting Rehab Potential  (+)motivated, acute condition(-)multiple comorbidities: arthritis, falls, decreased balance, history of TIAs, CVA    PT Frequency  2x / week    PT Duration  6 weeks    PT Treatment/Interventions  Balance training;Therapeutic exercise;Therapeutic activities;Patient/family education;Manual techniques;Cryotherapy;Electrical Stimulation    PT Next Visit Plan  exercise progression, sitting, standing, gait activities    PT Home Exercise Plan  hip adduction with ball and glute sets, SLR sitting, roll ball underfoot       Patient will benefit from skilled therapeutic intervention in order to improve the following deficits and impairments:  Pain, Decreased mobility, Impaired perceived functional ability, Decreased strength, Decreased range of motion, Decreased endurance, Decreased activity tolerance, Decreased balance, Decreased safety awareness, Difficulty walking, Increased muscle spasms  Visit Diagnosis: Difficulty in walking, not elsewhere classified  Muscle weakness (generalized)  Repeated falls     Problem List Patient Active Problem List   Diagnosis Date Noted  . Protein-calorie malnutrition, severe 11/08/2017  . Lumbar compression fracture ([place]) 11/05/2017  . Closed fracture of proximal end of left humerus with routine healing 08/28/2016  . Acute cystitis 04/28/2016  . Chest pain 03/14/2015  . TIA (transient ischemic attack) 01/29/2015  .  Allergic arthritis, hand     Jomarie Longs PT 12/24/2017, 9:29 AM  [facility] [address] [place], [address] Phone:  [phone]   Fax:  [phone]  Name: Stephanie Harris MRN: [id number] Date of Birth: November 22, 1930

## 2017-12-30 DIAGNOSIS — D631 Anemia in chronic kidney disease: Secondary | ICD-10-CM | POA: Diagnosis not present

## 2017-12-30 DIAGNOSIS — N183 Chronic kidney disease, stage 3 (moderate): Secondary | ICD-10-CM | POA: Diagnosis not present

## 2018-01-04 ENCOUNTER — Encounter: Payer: Self-pay | Admitting: Physical Therapy

## 2018-01-04 ENCOUNTER — Ambulatory Visit: Payer: Medicare Other | Admitting: Physical Therapy

## 2018-01-04 DIAGNOSIS — M6281 Muscle weakness (generalized): Secondary | ICD-10-CM | POA: Diagnosis not present

## 2018-01-04 DIAGNOSIS — R262 Difficulty in walking, not elsewhere classified: Secondary | ICD-10-CM | POA: Diagnosis not present

## 2018-01-04 DIAGNOSIS — R296 Repeated falls: Secondary | ICD-10-CM

## 2018-01-04 NOTE — Therapy (Signed)
[facility] [address] [place], [address] Phone: [phone]   Fax:  [phone]  Physical Therapy Treatment  Patient Details  Name: Stephanie Harris MRN: [id number] Date of Birth: 1931/07/16 Referring Provider: Feliberto Gottron PA   Encounter Date: 01/04/2018  PT End of Session - 01/04/18 1524    Visit Number  6    Number of Visits  12    Date for PT Re-Evaluation  01/13/18    Authorization Type  6 of 10 progress note    PT Start Time  8546    PT Stop Time  1515    PT Time Calculation (min)  47 min    Equipment Utilized During Treatment  Gait belt;Other (comment) patient rollator    Activity Tolerance  Patient tolerated treatment well    Behavior During Therapy  [facility] for tasks assessed/performed       Past Medical History:  Diagnosis Date  . Acute bronchiolitis   . Acute pharyngitis   . Allergic rhinitis due to pollen   . Anemia, unspecified    CKD and LGL  . Anxiety state    unspecified  . Asthma without status asthmaticus    unspecified  . B12 deficiency   . Beta thalassemia trait   . Carotid artery occlusion   . Cellulitis and abscess of leg, except foot   . Cervical spondylosis   . Cervicalgia   . Chronic kidney disease   . CML (chronic myelocytic leukemia) ([place])    Onc at [facility]  . Coronary artery disease 2009   heart attack with stent  . Coronary atherosclerosis of native coronary artery   . Depressive disorder    not elsewhere classified  . Diabetes mellitus type 2, uncomplicated ([place])   . Difficulty in walking   . Disorder of breast, unspecified   . Dizziness and giddiness   . Dysuria   . Esophageal reflux   . Essential hypertension    unspecified  . Head injury    unspecified  . Heart disease   . Hematuria, unspecified   . Hyperlipidemia, unspecified   . Hypersomnia, unspecified   . Hypertension   . Hypopotassemia   . Hypothyroidism    unspecified  . ICH  (intracerebral hemorrhage) ([place])   . Ill-defined cerebrovascular disease    other  . Insomnia    unspecified  . 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 Leukemia ([place])   . Lower urinary tract infection   . Lumbago   . Mini stroke ([place])    x years  . Mixed hyperlipidemia   . Nontoxic nodular goiter    unspecified  . Occlusion and stenosis of unspecified carotid artery    without mention of cerebral infarction  . Osteoarthritis   . Osteoporosis   . Other constipation   . Other vitamin B12 deficiency anemias   . Otitis media, unspecified, unspecified ear   . Ovarian failure    unspecified  . Pain in limb   . Panic disorder without agoraphobia   . Peripheral vascular disease ([place])    unspecified  . Phlebitis of left arm   . RA (rheumatoid arthritis) ([place])   . Sigmoid polyp 1998  . Sleep disturbance    unspecified  . Stroke ([place])   . Syncope and collapse   . Thalassemia   . TIA (transient ischemic attack)    2000 and 2008 right carotid stent 08/14/2009 on Plavix  . Transient  disorder of initiating or maintaining sleep   . Varicose veins of bilateral lower extremities with other complications   . Varicose veins of bilateral lower extremities with other complications   . Vision abnormalities     Past Surgical History:  Procedure Laterality Date  . BACK SURGERY    . CARDIAC CATHETERIZATION    . CAROTID ENDARTERECTOMY Left   . CATARACT EXTRACTION    . CHOLECYSTECTOMY  1980  . COLONOSCOPY  2010  . EYELID SURGERY Bilateral 2005   BUL BLEPH  . FRACTURE SURGERY    . JOINT REPLACEMENT    . KYPHOPLASTY N/A 08/12/2017   Procedure: KYPHOPLASTY;  Surgeon: Hessie Knows, MD;  Location: [facility] ORS;  Service: Orthopedics;  Laterality: N/A;  . KYPHOPLASTY N/A 11/08/2017   Procedure: Hewitt Shorts;  Surgeon: Hessie Knows, MD;  Location: [facility] ORS;  Service: Orthopedics;  Laterality: N/A;  . LENS EYE SURGERY Left 06/16/2001   +23.5D  . LENS EYE SURGERY Right 05/28/2003    22.5D  . PERCUTANEOUS CORONARY STENT INTERVENTION (PCI-S)    . PERCUTANEOUS PLACEMENT INTRAVASCULAR STENT CERVICAL CAROTID ARTERY  06/14/2009  . ROTATOR CUFF REPAIR Left   . SUPRACERVICAL ABDOMINAL HYSTERECTOMY  1978   WITH REMOVAL TUBES &/OR OVARIES  . TOTAL KNEE ARTHROPLASTY Right     There were no vitals filed for this visit.  Subjective Assessment - 01/04/18 1431    Subjective  Patient reports back pain and may be due to being up and walking too much. She is having a caregiver in the home x 4 hours x 5 days/week. Her chief concern today is fatigue.     Pertinent History  Patient fell at home 11/04/17 injuring back and underwent kyphoplasty L2 on 11/08/2017. She has history of chronic myelocytic leukemia, HTN, osteoarthritis, RA, osterporosis, TIAs and CVA. She has had multple falls at home when not using AD.     Limitations  Standing;Walking;Sitting    How long can you sit comfortably?  short periods of time    How long can you stand comfortably?  <10 min    How long can you walk comfortably?  short distances with rollator    Patient Stated Goals  decrease back pain and improve walking to reduce falls    Currently in Pain?  Yes    Pain Score  4     Pain Location  Hip    Pain Orientation  Right;Left;Posterior    Pain Descriptors / Indicators  Aching    Pain Type  Chronic pain;Surgical pain 11/08/2017    Pain Onset  More than a month ago    Pain Frequency  Intermittent      Objective: Gait: ambulating independently with Rolator slow cadence  Posture: increased thoracic kyphosis, rounded shoulders sitting and standing   Treatment: Manual therapy: 15 min sitting in chair: STM to thoracic and lumbar spine superficial techniques for spasms and decreasing pain, bilateral sides   Therapeutic exercise: patient performed with instruction, demonstration, verbal and tactile cueing of therapist: goal: independent with home program, improve ambulation, strength   Sitting: hip abduction  with yellow resistive band  x 25 reps knee flexion bilateral 2 x 15 with yellow resistive band and assist of therapist  knee extension with 2# ankle weights: tap balance stones  x 15 each LE  scapular retraction 2 x 15 with yellow resistive band  straight arm pull downs with yellow resistive band 2 x 15 reps palloff press to abdomen x 15 reps yellow resistive band  Patient response to treatment: Patient improved technique with repetition and moderate assistance and cuing. Improved soft tissue elasticity and decreased pain by 30% with STM   PT Education - 01/04/18 1500    Education provided  Yes    Education Details  exercise instruction    Person(s) Educated  Patient    Methods  Explanation;Verbal cues    Comprehension  Verbalized understanding;Verbal cues required          PT Long Term Goals - 12/02/17 1914      PT LONG TERM GOAL #1   Title  Patient will complete a TUG test in <20 seconds with an AD for independent mobility and decreased fall risk     Baseline  37 seconds with rollator    Status  New    Target Date  12/23/17      PT LONG TERM GOAL #2   Title  Patient  will complete 5x/sit to stand test in < 25 seconds indicating improved strength and decreased fall risk    Status  New    Target Date  12/30/17      PT LONG TERM GOAL #3   Title  improved 10Mw test iin 15 seconds with rollator and close supervision demonstrating improvement with ambulation and decreased fall risk    Baseline  10MW 25.5 seconds (.39 m/s)    Status  New    Target Date  01/13/18      PT LONG TERM GOAL #4   Title  Patient will be independent with home program for strength and balance activities to allow transition to self management     Baseline  requries instruction, guidance, cuing for exercises    Status  New    Target Date  01/13/18            Plan - 01/04/18 1518    Clinical Impression Statement  Patient demonstrated improved motor control and technique with exercises with  moderate assistance and verbal cuing. she is progressing slowly with goals and should continue to progress with additional physical therapy intervention.     Rehab Potential  Good    Clinical Impairments Affecting Rehab Potential  (+)motivated, acute condition(-)multiple comorbidities: arthritis, falls, decreased balance, history of TIAs, CVA    PT Frequency  2x / week    PT Duration  6 weeks    PT Treatment/Interventions  Balance training;Therapeutic exercise;Therapeutic activities;Patient/family education;Manual techniques;Cryotherapy;Electrical Stimulation    PT Next Visit Plan  exercise progression, sitting, standing, gait activities    PT Home Exercise Plan  hip adduction with ball and glute sets, SLR sitting, roll ball underfoot       Patient will benefit from skilled therapeutic intervention in order to improve the following deficits and impairments:  Pain, Decreased mobility, Impaired perceived functional ability, Decreased strength, Decreased range of motion, Decreased endurance, Decreased activity tolerance, Decreased balance, Decreased safety awareness, Difficulty walking, Increased muscle spasms  Visit Diagnosis: Difficulty in walking, not elsewhere classified  Muscle weakness (generalized)  Repeated falls     Problem List Patient Active Problem List   Diagnosis Date Noted  . Protein-calorie malnutrition, severe 11/08/2017  . Lumbar compression fracture ([place]) 11/05/2017  . Closed fracture of proximal end of left humerus with routine healing 08/28/2016  . Acute cystitis 04/28/2016  . Chest pain 03/14/2015  . TIA (transient ischemic attack) 01/29/2015  . Allergic arthritis, hand     Jomarie Longs PT 01/04/2018, 10:20 PM  [facility]  [address] [place], [address] Phone: [phone]   Fax:  [phone]  Name: Stephanie Harris MRN: [id number] Date of Birth: [birth year]

## 2018-01-06 ENCOUNTER — Ambulatory Visit: Payer: Medicare Other | Admitting: Physical Therapy

## 2018-01-06 ENCOUNTER — Encounter: Payer: Self-pay | Admitting: Physical Therapy

## 2018-01-06 DIAGNOSIS — R296 Repeated falls: Secondary | ICD-10-CM

## 2018-01-06 DIAGNOSIS — M6281 Muscle weakness (generalized): Secondary | ICD-10-CM | POA: Diagnosis not present

## 2018-01-06 DIAGNOSIS — R262 Difficulty in walking, not elsewhere classified: Secondary | ICD-10-CM

## 2018-01-06 NOTE — Therapy (Signed)
[facility] [address], [place], [address] Phone: [phone]   Fax:  [phone]  Physical Therapy Treatment  Patient Details  Name: Stephanie Harris MRN: [id number] Date of Birth: 06-29-1931 Referring Provider: Feliberto Gottron PA   Encounter Date: 01/06/2018  PT End of Session - 01/06/18 1444    Visit Number  7    Number of Visits  12    Date for PT Re-Evaluation  01/13/18    Authorization Type  7 of 10 progress note    PT Start Time  1435    PT Stop Time  1517    PT Time Calculation (min)  42 min    Equipment Utilized During Treatment  Gait belt;Other (comment) patient rollator    Activity Tolerance  Patient tolerated treatment well    Behavior During Therapy  [facility] for tasks assessed/performed       Past Medical History:  Diagnosis Date  . Acute bronchiolitis   . Acute pharyngitis   . Allergic rhinitis due to pollen   . Anemia, unspecified    CKD and LGL  . Anxiety state    unspecified  . Asthma without status asthmaticus    unspecified  . B12 deficiency   . Beta thalassemia trait   . Carotid artery occlusion   . Cellulitis and abscess of leg, except foot   . Cervical spondylosis   . Cervicalgia   . Chronic kidney disease   . CML (chronic myelocytic leukemia) ([place])    Onc at [facility]  . Coronary artery disease 2009   heart attack with stent  . Coronary atherosclerosis of native coronary artery   . Depressive disorder    not elsewhere classified  . Diabetes mellitus type 2, uncomplicated ([place])   . Difficulty in walking   . Disorder of breast, unspecified   . Dizziness and giddiness   . Dysuria   . Esophageal reflux   . Essential hypertension    unspecified  . Head injury    unspecified  . Heart disease   . Hematuria, unspecified   . Hyperlipidemia, unspecified   . Hypersomnia, unspecified   . Hypertension   . Hypopotassemia   . Hypothyroidism    unspecified  . ICH  (intracerebral hemorrhage) ([place])   . Ill-defined cerebrovascular disease    other  . Insomnia    unspecified  . 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 Leukemia ([place])   . Lower urinary tract infection   . Lumbago   . Mini stroke ([place])    x years  . Mixed hyperlipidemia   . Nontoxic nodular goiter    unspecified  . Occlusion and stenosis of unspecified carotid artery    without mention of cerebral infarction  . Osteoarthritis   . Osteoporosis   . Other constipation   . Other vitamin B12 deficiency anemias   . Otitis media, unspecified, unspecified ear   . Ovarian failure    unspecified  . Pain in limb   . Panic disorder without agoraphobia   . Peripheral vascular disease ([place])    unspecified  . Phlebitis of left arm   . RA (rheumatoid arthritis) ([place])   . Sigmoid polyp 1998  . Sleep disturbance    unspecified  . Stroke ([place])   . Syncope and collapse   . Thalassemia   . TIA (transient ischemic attack)    2000 and 2008 right carotid stent 08/14/2009 on Plavix  . Transient  disorder of initiating or maintaining sleep   . Varicose veins of bilateral lower extremities with other complications   . Varicose veins of bilateral lower extremities with other complications   . Vision abnormalities     Past Surgical History:  Procedure Laterality Date  . BACK SURGERY    . CARDIAC CATHETERIZATION    . CAROTID ENDARTERECTOMY Left   . CATARACT EXTRACTION    . CHOLECYSTECTOMY  1980  . COLONOSCOPY  2010  . EYELID SURGERY Bilateral 2005   BUL BLEPH  . FRACTURE SURGERY    . JOINT REPLACEMENT    . KYPHOPLASTY N/A 08/12/2017   Procedure: KYPHOPLASTY;  Surgeon: Hessie Knows, MD;  Location: [facility] ORS;  Service: Orthopedics;  Laterality: N/A;  . KYPHOPLASTY N/A 11/08/2017   Procedure: Hewitt Shorts;  Surgeon: Hessie Knows, MD;  Location: [facility] ORS;  Service: Orthopedics;  Laterality: N/A;  . LENS EYE SURGERY Left 06/16/2001   +23.5D  . LENS EYE SURGERY Right 05/28/2003    22.5D  . PERCUTANEOUS CORONARY STENT INTERVENTION (PCI-S)    . PERCUTANEOUS PLACEMENT INTRAVASCULAR STENT CERVICAL CAROTID ARTERY  06/14/2009  . ROTATOR CUFF REPAIR Left   . SUPRACERVICAL ABDOMINAL HYSTERECTOMY  1978   WITH REMOVAL TUBES &/OR OVARIES  . TOTAL KNEE ARTHROPLASTY Right     There were no vitals filed for this visit.  Subjective Assessment - 01/06/18 1440    Subjective  Patient reports she is cleaning out her closets at home with her caregiver and she is having increased soreness in her legs due to grocery shopping with caregiver. She reports she is noticing improving endurance and only had to rest 1x while in grocery store.     Pertinent History  Patient fell at home 11/04/17 injuring back and underwent kyphoplasty L2 on 11/08/2017. She has history of chronic myelocytic leukemia, HTN, osteoarthritis, RA, osterporosis, TIAs and CVA. She has had multple falls at home when not using AD.     Limitations  Standing;Walking;Sitting    How long can you sit comfortably?  short periods of time    How long can you stand comfortably?  <10 min    How long can you walk comfortably?  short distances with rollator    Patient Stated Goals  decrease back pain and improve walking to reduce falls    Currently in Pain?  Yes    Pain Score  4     Pain Location  Hip    Pain Orientation  Right;Left;Posterior    Pain Descriptors / Indicators  Aching;Sore    Pain Type  Chronic pain;Surgical pain 11/08/2017    Pain Onset  More than a month ago    Pain Frequency  Intermittent         Objective: Gait: ambulating independently with Rolator improved posture, more erect and improved cadence from previous session Posture: increased thoracic kyphosis, rounded shoulders sitting and standing   Treatment: Manual therapy: 13 min sitting in chair: STM to thoracic and lumbar spine superficial techniques for spasms and decreasing pain, bilateral paraspinal muscles   Therapeutic exercise: patient performed  with instruction, demonstration, verbal and tactile cueing of therapist: goal: independent with home program, improve ambulation, strength   Sitting: hip abduction with yellow resistive band  x 25 reps knee flexion bilateral 2 x 15 with yellow resistive band and assist of therapist  knee extension with 2# ankle weights: tap balance stones  x 25 each LE   scapular retraction 2 x 15 with yellow resistive band  straight arm  pull downs with yellow resistive band 2 x 15 reps palloff press to abdomen x 15 reps yellow resistive band   Patient response to treatment:improved soft tissue elasticity with decreased tenderness along bilateral paraspinal muscles thoracic spine by 30% following estim. improved  technique with exercises with minimal VC and demonstration.    PT Education - 01/06/18 1500    Education provided  Yes    Education Details  exercise instruction    Person(s) Educated  Patient    Methods  Explanation;Verbal cues;Demonstration    Comprehension  Verbalized understanding;Returned demonstration;Verbal cues required          PT Long Term Goals - 12/02/17 1914      PT LONG TERM GOAL #1   Title  Patient will complete a TUG test in <20 seconds with an AD for independent mobility and decreased fall risk     Baseline  37 seconds with rollator    Status  New    Target Date  12/23/17      PT LONG TERM GOAL #2   Title  Patient  will complete 5x/sit to stand test in < 25 seconds indicating improved strength and decreased fall risk    Status  New    Target Date  12/30/17      PT LONG TERM GOAL #3   Title  improved 10Mw test iin 15 seconds with rollator and close supervision demonstrating improvement with ambulation and decreased fall risk    Baseline  10MW 25.5 seconds (.39 m/s)    Status  New    Target Date  01/13/18      PT LONG TERM GOAL #4   Title  Patient will be independent with home program for strength and balance activities to allow transition to self management      Baseline  requries instruction, guidance, cuing for exercises    Status  New    Target Date  01/13/18            Plan - 01/06/18 1533    Clinical Impression Statement  Patient demonstrated improved technique with exercises with minimal cuing and demonstration. She continues with decreased strenght in core and LE's and will benefit from continued physical therapy intervention to achieve goals. She is progressing slowly due to age, injury and chronic condition.     Rehab Potential  Good    Clinical Impairments Affecting Rehab Potential  (+)motivated, acute condition(-)multiple comorbidities: arthritis, falls, decreased balance, history of TIAs, CVA    PT Frequency  2x / week    PT Duration  6 weeks    PT Treatment/Interventions  Balance training;Therapeutic exercise;Therapeutic activities;Patient/family education;Manual techniques;Cryotherapy;Electrical Stimulation    PT Next Visit Plan  exercise progression, sitting, standing, gait activities    PT Home Exercise Plan  hip adduction with ball and glute sets, SLR sitting, roll ball underfoot       Patient will benefit from skilled therapeutic intervention in order to improve the following deficits and impairments:  Pain, Decreased mobility, Impaired perceived functional ability, Decreased strength, Decreased range of motion, Decreased endurance, Decreased activity tolerance, Decreased balance, Decreased safety awareness, Difficulty walking, Increased muscle spasms  Visit Diagnosis: Difficulty in walking, not elsewhere classified  Muscle weakness (generalized)  Repeated falls     Problem List Patient Active Problem List   Diagnosis Date Noted  . Protein-calorie malnutrition, severe 11/08/2017  . Lumbar compression fracture ([place]) 11/05/2017  . Closed fracture of proximal end of left humerus with routine healing 08/28/2016  . Acute  cystitis 04/28/2016  . Chest pain 03/14/2015  . TIA (transient ischemic attack) 01/29/2015  .  Allergic arthritis, hand     Jomarie Longs PT 01/07/2018, 1:37 PM  [facility] [address] [place], [address] Phone: [phone]   Fax:  [phone]  Name: Stephanie Harris MRN: [id number] Date of Birth: [birth year]

## 2018-01-10 ENCOUNTER — Encounter: Payer: Medicare Other | Admitting: Physical Therapy

## 2018-01-11 ENCOUNTER — Ambulatory Visit: Payer: Medicare Other | Admitting: Physical Therapy

## 2018-01-13 ENCOUNTER — Ambulatory Visit: Payer: Medicare Other | Admitting: Physical Therapy

## 2018-01-13 DIAGNOSIS — D72820 Lymphocytosis (symptomatic): Secondary | ICD-10-CM | POA: Diagnosis not present

## 2018-01-13 DIAGNOSIS — N183 Chronic kidney disease, stage 3 (moderate): Secondary | ICD-10-CM | POA: Diagnosis not present

## 2018-01-18 ENCOUNTER — Encounter: Payer: Self-pay | Admitting: Physical Therapy

## 2018-01-18 ENCOUNTER — Ambulatory Visit: Payer: Medicare Other | Admitting: Physical Therapy

## 2018-01-18 DIAGNOSIS — R262 Difficulty in walking, not elsewhere classified: Secondary | ICD-10-CM

## 2018-01-18 DIAGNOSIS — M6281 Muscle weakness (generalized): Secondary | ICD-10-CM

## 2018-01-18 DIAGNOSIS — R296 Repeated falls: Secondary | ICD-10-CM

## 2018-01-18 NOTE — Therapy (Signed)
[facility] [address], [place], [address] Phone: [phone]   Fax:  [phone]  Physical Therapy Treatment  Patient Details  Name: Stephanie Harris MRN: [id number] Date of Birth: 01-Jun-1931 Referring Provider: Feliberto Gottron PA   Encounter Date: 01/18/2018  PT End of Session - 01/18/18 1455    Visit Number  8    Number of Visits  12    Date for PT Re-Evaluation  01/13/18    Authorization Type  8 of 10 progress note    PT Start Time  4481    PT Stop Time  1515    PT Time Calculation (min)  30 min    Equipment Utilized During Treatment  Gait belt;Other (comment) patient rollator    Activity Tolerance  Patient tolerated treatment well    Behavior During Therapy  [facility] for tasks assessed/performed       Past Medical History:  Diagnosis Date  . Acute bronchiolitis   . Acute pharyngitis   . Allergic rhinitis due to pollen   . Anemia, unspecified    CKD and LGL  . Anxiety state    unspecified  . Asthma without status asthmaticus    unspecified  . B12 deficiency   . Beta thalassemia trait   . Carotid artery occlusion   . Cellulitis and abscess of leg, except foot   . Cervical spondylosis   . Cervicalgia   . Chronic kidney disease   . CML (chronic myelocytic leukemia) ([place])    Onc at [facility]  . Coronary artery disease 2009   heart attack with stent  . Coronary atherosclerosis of native coronary artery   . Depressive disorder    not elsewhere classified  . Diabetes mellitus type 2, uncomplicated ([place])   . Difficulty in walking   . Disorder of breast, unspecified   . Dizziness and giddiness   . Dysuria   . Esophageal reflux   . Essential hypertension    unspecified  . Head injury    unspecified  . Heart disease   . Hematuria, unspecified   . Hyperlipidemia, unspecified   . Hypersomnia, unspecified   . Hypertension   . Hypopotassemia   . Hypothyroidism    unspecified  . ICH  (intracerebral hemorrhage) ([place])   . Ill-defined cerebrovascular disease    other  . Insomnia    unspecified  . 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 Leukemia ([place])   . Lower urinary tract infection   . Lumbago   . Mini stroke ([place])    x years  . Mixed hyperlipidemia   . Nontoxic nodular goiter    unspecified  . Occlusion and stenosis of unspecified carotid artery    without mention of cerebral infarction  . Osteoarthritis   . Osteoporosis   . Other constipation   . Other vitamin B12 deficiency anemias   . Otitis media, unspecified, unspecified ear   . Ovarian failure    unspecified  . Pain in limb   . Panic disorder without agoraphobia   . Peripheral vascular disease ([place])    unspecified  . Phlebitis of left arm   . RA (rheumatoid arthritis) ([place])   . Sigmoid polyp 1998  . Sleep disturbance    unspecified  . Stroke ([place])   . Syncope and collapse   . Thalassemia   . TIA (transient ischemic attack)    2000 and 2008 right carotid stent 08/14/2009 on Plavix  . Transient  disorder of initiating or maintaining sleep   . Varicose veins of bilateral lower extremities with other complications   . Varicose veins of bilateral lower extremities with other complications   . Vision abnormalities     Past Surgical History:  Procedure Laterality Date  . BACK SURGERY    . CARDIAC CATHETERIZATION    . CAROTID ENDARTERECTOMY Left   . CATARACT EXTRACTION    . CHOLECYSTECTOMY  1980  . COLONOSCOPY  2010  . EYELID SURGERY Bilateral 2005   BUL BLEPH  . FRACTURE SURGERY    . JOINT REPLACEMENT    . KYPHOPLASTY N/A 08/12/2017   Procedure: KYPHOPLASTY;  Surgeon: Hessie Knows, MD;  Location: [facility] ORS;  Service: Orthopedics;  Laterality: N/A;  . KYPHOPLASTY N/A 11/08/2017   Procedure: Hewitt Shorts;  Surgeon: Hessie Knows, MD;  Location: [facility] ORS;  Service: Orthopedics;  Laterality: N/A;  . LENS EYE SURGERY Left 06/16/2001   +23.5D  . LENS EYE SURGERY Right 05/28/2003    22.5D  . PERCUTANEOUS CORONARY STENT INTERVENTION (PCI-S)    . PERCUTANEOUS PLACEMENT INTRAVASCULAR STENT CERVICAL CAROTID ARTERY  06/14/2009  . ROTATOR CUFF REPAIR Left   . SUPRACERVICAL ABDOMINAL HYSTERECTOMY  1978   WITH REMOVAL TUBES &/OR OVARIES  . TOTAL KNEE ARTHROPLASTY Right     There were no vitals filed for this visit.  Subjective Assessment - 01/18/18 1449    Subjective  Patient reports that she fell last Thursday at home while trying to move kithchen. She was seen at [facility] when she went for her check up. She has a large hematoma right arm where she fell up against the refridgerator.  She also reports she feels weak, low energy and her Hgb level was 7 when she was seen by MD last week.    Pertinent History  Patient fell at home 11/04/17 injuring back and underwent kyphoplasty L2 on 11/08/2017. She has history of chronic myelocytic leukemia, HTN, osteoarthritis, RA, osterporosis, TIAs and CVA. She has had multple falls at home when not using AD.     Limitations  Standing;Walking;Sitting    How long can you sit comfortably?  short periods of time    How long can you stand comfortably?  <10 min    How long can you walk comfortably?  short distances with rollator    Patient Stated Goals  decrease back pain and improve walking to reduce falls    Currently in Pain?  Yes    Pain Score  5     Pain Location  Back    Pain Orientation  Left    Pain Descriptors / Indicators  Aching;Sore    Pain Type  Chronic pain;Surgical pain 11/08/17    Pain Onset  More than a month ago    Pain Frequency  Intermittent        Objective: Gait: ambulating independently with Rolator improved posture, more erect than previous session Palpation: increased spasms along left side paraspinal muscles thoracic to lumbar spine Observation: large hematoma forearm right UE   Treatment: Manual therapy: 15 min sitting in chair: STM to thoracic and lumbar spine superficial techniques for spasms and  decreasing pain, bilateral paraspinal muscles   Therapeutic exercise: patient performed with instruction, demonstration, verbal and tactile cueing of therapist: goal: independent with home program, improve ambulation, strength   Sitting: hip abduction with yellow resistive band  x 25 reps knee flexion bilateral 2 x 15 with yellow resistive band and assist of therapist    Patient response  to treatment:improved soft tissue elasticity with decreased spasms by >50% and improved pain level to 3/10 at end of session. Limited exercise due to patient not having enough energy today.         PT Education - 01/18/18 1510    Education provided  Yes    Education Details  re assessed home program    Person(s) Educated  Patient    Methods  Explanation    Comprehension  Verbalized understanding          PT Long Term Goals - 12/02/17 1914      PT LONG TERM GOAL #1   Title  Patient will complete a TUG test in <20 seconds with an AD for independent mobility and decreased fall risk     Baseline  37 seconds with rollator    Status  New    Target Date  12/23/17      PT LONG TERM GOAL #2   Title  Patient  will complete 5x/sit to stand test in < 25 seconds indicating improved strength and decreased fall risk    Status  New    Target Date  12/30/17      PT LONG TERM GOAL #3   Title  improved 10Mw test iin 15 seconds with rollator and close supervision demonstrating improvement with ambulation and decreased fall risk    Baseline  10MW 25.5 seconds (.39 m/s)    Status  New    Target Date  01/13/18      PT LONG TERM GOAL #4   Title  Patient will be independent with home program for strength and balance activities to allow transition to self management     Baseline  requries instruction, guidance, cuing for exercises    Status  New    Target Date  01/13/18            Plan - 01/18/18 1519    Clinical Impression Statement  Patient responded well to [facility] with decrased spasm and pain in  back. limited exercises due to patient arriving late and patient reporting not feeling well with decreased energy due to low Hgb of 7 per her report     Rehab Potential  Good    Clinical Impairments Affecting Rehab Potential  (+)motivated, acute condition(-)multiple comorbidities: arthritis, falls, decreased balance, history of TIAs, CVA    PT Frequency  2x / week    PT Duration  6 weeks    PT Treatment/Interventions  Balance training;Therapeutic exercise;Therapeutic activities;Patient/family education;Manual techniques;Cryotherapy;Electrical Stimulation    PT Next Visit Plan  exercise progression, sitting, standing, gait activities    PT Home Exercise Plan  hip adduction with ball and glute sets, SLR sitting, roll ball underfoot       Patient will benefit from skilled therapeutic intervention in order to improve the following deficits and impairments:  Pain, Decreased mobility, Impaired perceived functional ability, Decreased strength, Decreased range of motion, Decreased endurance, Decreased activity tolerance, Decreased balance, Decreased safety awareness, Difficulty walking, Increased muscle spasms  Visit Diagnosis: Difficulty in walking, not elsewhere classified  Muscle weakness (generalized)  Repeated falls     Problem List Patient Active Problem List   Diagnosis Date Noted  . Protein-calorie malnutrition, severe 11/08/2017  . Lumbar compression fracture ([place]) 11/05/2017  . Closed fracture of proximal end of left humerus with routine healing 08/28/2016  . Acute cystitis 04/28/2016  . Chest pain 03/14/2015  . TIA (transient ischemic attack) 01/29/2015  . Allergic arthritis, hand     Rolena Infante,  Marie PT 01/19/2018, 8:11 AM  [facility] [address], [place], [address] Phone: [phone]   Fax:  [phone]  Name: SAMAN GIDDENS MRN: [id number] Date of Birth: [birth year]

## 2018-01-20 ENCOUNTER — Ambulatory Visit: Payer: Medicare Other | Admitting: Physical Therapy

## 2018-01-20 ENCOUNTER — Encounter: Payer: Self-pay | Admitting: Physical Therapy

## 2018-01-20 DIAGNOSIS — R262 Difficulty in walking, not elsewhere classified: Secondary | ICD-10-CM

## 2018-01-20 DIAGNOSIS — M6281 Muscle weakness (generalized): Secondary | ICD-10-CM

## 2018-01-20 DIAGNOSIS — R296 Repeated falls: Secondary | ICD-10-CM | POA: Diagnosis not present

## 2018-01-21 NOTE — Addendum Note (Signed)
Addended by: Aldona Lento on: 01/21/2018 10:01 PM   Modules accepted: Orders

## 2018-01-21 NOTE — Therapy (Addendum)
[facility] [address] [place], [address] Phone: [phone]   Fax:  [phone]  Physical Therapy Treatment  Patient Details  Name: Stephanie Harris MRN: [id number] Date of Birth: 01-22-1931 Referring Provider: Feliberto Gottron PA   Encounter Date: 01/20/2018  PT End of Session - 01/20/18 1600    Visit Number  9    Number of Visits  24    Date for PT Re-Evaluation  03/03/18    Authorization Type  9 of 10 progress note    PT Start Time  8416    PT Stop Time  1600    PT Time Calculation (min)  44 min    Equipment Utilized During Treatment  Gait belt;Other (comment) patient rollator    Activity Tolerance  Patient tolerated treatment well    Behavior During Therapy  [facility] for tasks assessed/performed       Past Medical History:  Diagnosis Date  . Acute bronchiolitis   . Acute pharyngitis   . Allergic rhinitis due to pollen   . Anemia, unspecified    CKD and LGL  . Anxiety state    unspecified  . Asthma without status asthmaticus    unspecified  . B12 deficiency   . Beta thalassemia trait   . Carotid artery occlusion   . Cellulitis and abscess of leg, except foot   . Cervical spondylosis   . Cervicalgia   . Chronic kidney disease   . CML (chronic myelocytic leukemia) ([place])    Onc at [facility]  . Coronary artery disease 2009   heart attack with stent  . Coronary atherosclerosis of native coronary artery   . Depressive disorder    not elsewhere classified  . Diabetes mellitus type 2, uncomplicated ([place])   . Difficulty in walking   . Disorder of breast, unspecified   . Dizziness and giddiness   . Dysuria   . Esophageal reflux   . Essential hypertension    unspecified  . Head injury    unspecified  . Heart disease   . Hematuria, unspecified   . Hyperlipidemia, unspecified   . Hypersomnia, unspecified   . Hypertension   . Hypopotassemia   . Hypothyroidism    unspecified  . ICH  (intracerebral hemorrhage) ([place])   . Ill-defined cerebrovascular disease    other  . Insomnia    unspecified  . 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 Leukemia ([place])   . Lower urinary tract infection   . Lumbago   . Mini stroke ([place])    x years  . Mixed hyperlipidemia   . Nontoxic nodular goiter    unspecified  . Occlusion and stenosis of unspecified carotid artery    without mention of cerebral infarction  . Osteoarthritis   . Osteoporosis   . Other constipation   . Other vitamin B12 deficiency anemias   . Otitis media, unspecified, unspecified ear   . Ovarian failure    unspecified  . Pain in limb   . Panic disorder without agoraphobia   . Peripheral vascular disease ([place])    unspecified  . Phlebitis of left arm   . RA (rheumatoid arthritis) ([place])   . Sigmoid polyp 1998  . Sleep disturbance    unspecified  . Stroke ([place])   . Syncope and collapse   . Thalassemia   . TIA (transient ischemic attack)    2000 and 2008 right carotid stent 08/14/2009 on Plavix  . Transient  disorder of initiating or maintaining sleep   . Varicose veins of bilateral lower extremities with other complications   . Varicose veins of bilateral lower extremities with other complications   . Vision abnormalities     Past Surgical History:  Procedure Laterality Date  . BACK SURGERY    . CARDIAC CATHETERIZATION    . CAROTID ENDARTERECTOMY Left   . CATARACT EXTRACTION    . CHOLECYSTECTOMY  1980  . COLONOSCOPY  2010  . EYELID SURGERY Bilateral 2005   BUL BLEPH  . FRACTURE SURGERY    . JOINT REPLACEMENT    . KYPHOPLASTY N/A 08/12/2017   Procedure: KYPHOPLASTY;  Surgeon: Hessie Knows, MD;  Location: [facility] ORS;  Service: Orthopedics;  Laterality: N/A;  . KYPHOPLASTY N/A 11/08/2017   Procedure: Hewitt Shorts;  Surgeon: Hessie Knows, MD;  Location: [facility] ORS;  Service: Orthopedics;  Laterality: N/A;  . LENS EYE SURGERY Left 06/16/2001   +23.5D  . LENS EYE SURGERY Right 05/28/2003    22.5D  . PERCUTANEOUS CORONARY STENT INTERVENTION (PCI-S)    . PERCUTANEOUS PLACEMENT INTRAVASCULAR STENT CERVICAL CAROTID ARTERY  06/14/2009  . ROTATOR CUFF REPAIR Left   . SUPRACERVICAL ABDOMINAL HYSTERECTOMY  1978   WITH REMOVAL TUBES &/OR OVARIES  . TOTAL KNEE ARTHROPLASTY Right     There were no vitals filed for this visit.  Subjective Assessment - 01/20/18 1518    Subjective  Patient reports pain in her back left>right. Right arm is feeling some better.  She reports she is still tired    Pertinent History  Patient fell at home 11/04/17 injuring back and underwent kyphoplasty L2 on 11/08/2017. She has history of chronic myelocytic leukemia, HTN, osteoarthritis, RA, osterporosis, TIAs and CVA. She has had multple falls at home when not using AD.     Limitations  Standing;Walking;Sitting    How long can you sit comfortably?  short periods of time    How long can you stand comfortably?  <10 min    How long can you walk comfortably?  short distances with rollator    Patient Stated Goals  decrease back pain and improve walking to reduce falls    Currently in Pain?  Yes    Pain Score  5     Pain Location  Back    Pain Orientation  Left    Pain Descriptors / Indicators  Aching;Spasm    Pain Type  Surgical pain;Chronic pain 11/08/17    Pain Onset  More than a month ago    Pain Frequency  Intermittent         Objective: Gait: ambulating independently with Rolator improved posture, more erect than previous session Palpation: increased spasms along left side paraspinal muscles thoracic to lumbar spine; imrpoved from previous session Observation: large hematoma forearm right UE improving Outcome measures 10MW and TUG and 5x sit to stand are deferred due to hemoglobin being low and her recovering from recent fall  Treatment: Manual therapy: 15 min sitting in chair: STM to thoracic and lumbar spine superficial techniques for spasms and decreasing pain, bilateral paraspinal muscles    Therapeutic exercise: patient performed with instruction, demonstration, verbal and tactile cueing of therapist: goal: independent with home program, improve ambulation, strength   Sitting: hip abduction with yellow resistive band  x 25 reps knee flexion bilateral 2 x 15 with yellow resistive band and assist of therapist Knee extension with 2# weight on ankles tap balance stones x 15 Knee flexion with yellow resistive band and assistance of therapist  x 15 each LE  UE exercises with resistive band: Scapular rows bilateral x 15 Straight arm pull downs x 15 palloff press to trunk x 15    Patient response to treatment: improved soft tissue elasticity and decreased spasms and tenderness by 30% following STM; improved exercise technique with demonstration, moderate VC; patient demonstrated moderate fatigue with exercise        PT Education - 01/20/18 1520    Education provided  Yes    Education Details  exercise instruction    Person(s) Educated  Patient    Methods  Explanation;Verbal cues;Demonstration    Comprehension  Verbalized understanding;Returned demonstration;Verbal cues required          PT Long Term Goals - 01/20/18 1615      PT LONG TERM GOAL #1   Title  Patient will complete a TUG test in <20 seconds with an AD for independent mobility and decreased fall risk     Baseline  37 seconds with rollator    Status  Revised    Target Date  03/03/18      PT LONG TERM GOAL #2   Title  Patient  will complete 5x/sit to stand test in < 25 seconds indicating improved strength and decreased fall risk    Status  Revised    Target Date  03/03/18      PT LONG TERM GOAL #3   Title  improved 10Mw test iin 15 seconds with rollator and close supervision demonstrating improvement with ambulation and decreased fall risk    Baseline  10MW 25.5 seconds (.39 m/s)    Status  Revised    Target Date  03/03/18      PT LONG TERM GOAL #4   Title  Patient will be independent with home  program for strength and balance activities to allow transition to self management     Baseline  requries instruction, guidance, cuing for exercises    Status  Revised    Target Date  03/03/18            Plan - 01/20/18 1605    Clinical Impression Statement  Patient is progressing slowly with exercise and decreasing back pain and spasms. She has multiple co morbidities that limit progress and she will benefit from additional physical therapy intervention to improve strength and endurance for improved function with ADLs, ambulation and to prevent falls.     Rehab Potential  Good    Clinical Impairments Affecting Rehab Potential  (+)motivated, acute condition(-)multiple comorbidities: arthritis, falls, decreased balance, history of TIAs, CVA    PT Frequency  2x / week    PT Duration  6 weeks    PT Treatment/Interventions  Balance training;Therapeutic exercise;Therapeutic activities;Patient/family education;Manual techniques;Cryotherapy;Electrical Stimulation    PT Next Visit Plan  exercise progression, sitting, standing, gait activities    PT Home Exercise Plan  hip adduction with ball and glute sets, SLR sitting, roll ball underfoot    Consulted and Agree with Plan of Care  Patient       Patient will benefit from skilled therapeutic intervention in order to improve the following deficits and impairments:  Pain, Decreased mobility, Impaired perceived functional ability, Decreased strength, Decreased range of motion, Decreased endurance, Decreased activity tolerance, Decreased balance, Decreased safety awareness, Difficulty walking, Increased muscle spasms  Visit Diagnosis: Difficulty in walking, not elsewhere classified - Plan: PT plan of care cert/re-cert  Muscle weakness (generalized) - Plan: PT plan of care cert/re-cert  Repeated falls - Plan: PT plan of  care cert/re-cert     Problem List Patient Active Problem List   Diagnosis Date Noted  . Protein-calorie malnutrition,  severe 11/08/2017  . Lumbar compression fracture ([place]) 11/05/2017  . Closed fracture of proximal end of left humerus with routine healing 08/28/2016  . Acute cystitis 04/28/2016  . Chest pain 03/14/2015  . TIA (transient ischemic attack) 01/29/2015  . Allergic arthritis, hand     Jomarie Longs PT 01/21/2018, 9:48 PM  [facility] [address] [place], [address] Phone: [phone]   Fax:  [phone]  Name: LOURA PITT MRN: [id number] Date of Birth: Oct 19, 1930

## 2018-01-24 ENCOUNTER — Ambulatory Visit: Payer: Medicare Other | Attending: Orthopedic Surgery | Admitting: Physical Therapy

## 2018-01-24 DIAGNOSIS — R296 Repeated falls: Secondary | ICD-10-CM | POA: Insufficient documentation

## 2018-01-24 DIAGNOSIS — R262 Difficulty in walking, not elsewhere classified: Secondary | ICD-10-CM | POA: Insufficient documentation

## 2018-01-24 DIAGNOSIS — M6281 Muscle weakness (generalized): Secondary | ICD-10-CM | POA: Insufficient documentation

## 2018-01-26 ENCOUNTER — Ambulatory Visit: Payer: Medicare Other | Admitting: Physical Therapy

## 2018-01-27 DIAGNOSIS — N183 Chronic kidney disease, stage 3 (moderate): Secondary | ICD-10-CM | POA: Diagnosis not present

## 2018-01-27 DIAGNOSIS — D631 Anemia in chronic kidney disease: Secondary | ICD-10-CM | POA: Diagnosis not present

## 2018-01-31 ENCOUNTER — Ambulatory Visit: Payer: Medicare Other | Admitting: Physical Therapy

## 2018-02-02 ENCOUNTER — Encounter: Payer: Self-pay | Admitting: Physical Therapy

## 2018-02-02 ENCOUNTER — Ambulatory Visit: Payer: Medicare Other | Admitting: Physical Therapy

## 2018-02-02 DIAGNOSIS — M6281 Muscle weakness (generalized): Secondary | ICD-10-CM | POA: Diagnosis not present

## 2018-02-02 DIAGNOSIS — R296 Repeated falls: Secondary | ICD-10-CM

## 2018-02-02 DIAGNOSIS — R262 Difficulty in walking, not elsewhere classified: Secondary | ICD-10-CM | POA: Diagnosis not present

## 2018-02-03 ENCOUNTER — Encounter: Payer: Self-pay | Admitting: Physical Therapy

## 2018-02-03 ENCOUNTER — Ambulatory Visit: Payer: Medicare Other | Admitting: Physical Therapy

## 2018-02-03 DIAGNOSIS — M6281 Muscle weakness (generalized): Secondary | ICD-10-CM

## 2018-02-03 DIAGNOSIS — R262 Difficulty in walking, not elsewhere classified: Secondary | ICD-10-CM | POA: Diagnosis not present

## 2018-02-03 DIAGNOSIS — R296 Repeated falls: Secondary | ICD-10-CM

## 2018-02-03 NOTE — Therapy (Signed)
[facility] [address] [place], [address] Phone: [phone]   Fax:  [phone]  Physical Therapy Treatment  Patient Details  Name: Stephanie Harris MRN: [id number] Date of Birth: [birth year] Referring Provider: Feliberto Gottron PA   Encounter Date: 02/02/2018  PT End of Session - 02/02/18 1720    Visit Number  10    Number of Visits  24    Date for PT Re-Evaluation  03/03/18    Authorization Type  10 of 10 progress note    PT Start Time  5638    PT Stop Time  1746    PT Time Calculation (min)  31 min    Equipment Utilized During Treatment  Gait belt;Other (comment) patient rollator    Activity Tolerance  Patient tolerated treatment well    Behavior During Therapy  [facility] for tasks assessed/performed       Past Medical History:  Diagnosis Date  . Acute bronchiolitis   . Acute pharyngitis   . Allergic rhinitis due to pollen   . Anemia, unspecified    CKD and LGL  . Anxiety state    unspecified  . Asthma without status asthmaticus    unspecified  . B12 deficiency   . Beta thalassemia trait   . Carotid artery occlusion   . Cellulitis and abscess of leg, except foot   . Cervical spondylosis   . Cervicalgia   . Chronic kidney disease   . CML (chronic myelocytic leukemia) ([place])    Onc at [facility]  . Coronary artery disease 2009   heart attack with stent  . Coronary atherosclerosis of native coronary artery   . Depressive disorder    not elsewhere classified  . Diabetes mellitus type 2, uncomplicated ([place])   . Difficulty in walking   . Disorder of breast, unspecified   . Dizziness and giddiness   . Dysuria   . Esophageal reflux   . Essential hypertension    unspecified  . Head injury    unspecified  . Heart disease   . Hematuria, unspecified   . Hyperlipidemia, unspecified   . Hypersomnia, unspecified   . Hypertension   . Hypopotassemia   . Hypothyroidism    unspecified  . ICH  (intracerebral hemorrhage) ([place])   . Ill-defined cerebrovascular disease    other  . Insomnia    unspecified  . 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 Leukemia ([place])   . Lower urinary tract infection   . Lumbago   . Mini stroke ([place])    x years  . Mixed hyperlipidemia   . Nontoxic nodular goiter    unspecified  . Occlusion and stenosis of unspecified carotid artery    without mention of cerebral infarction  . Osteoarthritis   . Osteoporosis   . Other constipation   . Other vitamin B12 deficiency anemias   . Otitis media, unspecified, unspecified ear   . Ovarian failure    unspecified  . Pain in limb   . Panic disorder without agoraphobia   . Peripheral vascular disease ([place])    unspecified  . Phlebitis of left arm   . RA (rheumatoid arthritis) ([place])   . Sigmoid polyp 1998  . Sleep disturbance    unspecified  . Stroke ([place])   . Syncope and collapse   . Thalassemia   . TIA (transient ischemic attack)    2000 and 2008 right carotid stent 08/14/2009 on Plavix  . Transient  disorder of initiating or maintaining sleep   . Varicose veins of bilateral lower extremities with other complications   . Varicose veins of bilateral lower extremities with other complications   . Vision abnormalities     Past Surgical History:  Procedure Laterality Date  . BACK SURGERY    . CARDIAC CATHETERIZATION    . CAROTID ENDARTERECTOMY Left   . CATARACT EXTRACTION    . CHOLECYSTECTOMY  1980  . COLONOSCOPY  2010  . EYELID SURGERY Bilateral 2005   BUL BLEPH  . FRACTURE SURGERY    . JOINT REPLACEMENT    . KYPHOPLASTY N/A 08/12/2017   Procedure: KYPHOPLASTY;  Surgeon: Hessie Knows, MD;  Location: [facility] ORS;  Service: Orthopedics;  Laterality: N/A;  . KYPHOPLASTY N/A 11/08/2017   Procedure: Hewitt Shorts;  Surgeon: Hessie Knows, MD;  Location: [facility] ORS;  Service: Orthopedics;  Laterality: N/A;  . LENS EYE SURGERY Left 06/16/2001   +23.5D  . LENS EYE SURGERY Right 05/28/2003    22.5D  . PERCUTANEOUS CORONARY STENT INTERVENTION (PCI-S)    . PERCUTANEOUS PLACEMENT INTRAVASCULAR STENT CERVICAL CAROTID ARTERY  06/14/2009  . ROTATOR CUFF REPAIR Left   . SUPRACERVICAL ABDOMINAL HYSTERECTOMY  1978   WITH REMOVAL TUBES &/OR OVARIES  . TOTAL KNEE ARTHROPLASTY Right     There were no vitals filed for this visit.  Subjective Assessment - 02/02/18 1717    Subjective  Patient reports she has been having issues at home with refridgerator and is feeling depressed. She is currently having back pain     Pertinent History  Patient fell at home 11/04/17 injuring back and underwent kyphoplasty L2 on 11/08/2017. She has history of chronic myelocytic leukemia, HTN, osteoarthritis, RA, osterporosis, TIAs and CVA. She has had multple falls at home when not using AD.     Limitations  Standing;Walking;Sitting    How long can you sit comfortably?  short periods of time    How long can you stand comfortably?  <10 min    How long can you walk comfortably?  short distances with rollator    Patient Stated Goals  decrease back pain and improve walking to reduce falls    Currently in Pain?  Yes    Pain Score  5     Pain Location  Back    Pain Orientation  Left    Pain Descriptors / Indicators  Aching;Spasm    Pain Type  Surgical pain    Pain Onset  More than a month ago    Pain Frequency  Intermittent         Objective: Gait: ambulating independently with Rolator improved posture, more erect than previous session Palpation: increased spasms along left side paraspinal muscles thoracic to lumbar spine; improved from previous session   Treatment: Manual therapy: 15 min sitting in chair: STM to thoracic and lumbar spine superficial techniques for spasms and decreasing pain, bilateral paraspinal muscles   Therapeutic exercise: patient performed with instruction, demonstration, verbal and tactile cueing of therapist: goal: independent with home program, improve ambulation, strength    Sitting: hip abduction with yellow resistive band  x 15 reps Knee extension with 2# weight on ankles tap balance stones x 15 Knee flexion with yellow resistive band and assistance of therapist  x 15 each LE   UE exercises with resistive band: Scapular rows bilateral x 15 Straight arm pull downs x 15 palloff press to trunk x 15    Patient response to treatment: improved soft tissue elasticity and decreased spasms  and tenderness by 50% following STM; improved exercise technique with demonstration, moderate VC.      PT Education - 02/02/18 1725    Education provided  Yes    Education Details  exercise instruction    Person(s) Educated  Patient    Methods  Explanation;Demonstration;Verbal cues    Comprehension  Verbalized understanding;Returned demonstration;Verbal cues required          PT Long Term Goals - 01/20/18 1615      PT LONG TERM GOAL #1   Title  Patient will complete a TUG test in <20 seconds with an AD for independent mobility and decreased fall risk     Baseline  37 seconds with rollator    Status  Revised    Target Date  03/03/18      PT LONG TERM GOAL #2   Title  Patient  will complete 5x/sit to stand test in < 25 seconds indicating improved strength and decreased fall risk    Status  Revised    Target Date  03/03/18      PT LONG TERM GOAL #3   Title  improved 10Mw test iin 15 seconds with rollator and close supervision demonstrating improvement with ambulation and decreased fall risk    Baseline  10MW 25.5 seconds (.39 m/s)    Status  Revised    Target Date  03/03/18      PT LONG TERM GOAL #4   Title  Patient will be independent with home program for strength and balance activities to allow transition to self management     Baseline  requries instruction, guidance, cuing for exercises    Status  Revised    Target Date  03/03/18            Plan - 02/02/18 1759    Clinical Impression Statement  Patient is progressing slowly with exercise due to  fatigue and she has not had her cargiver for the past few days. She was limited in ability to exercise due to decreased endurance.     Rehab Potential  Good    Clinical Impairments Affecting Rehab Potential  (+)motivated, acute condition(-)multiple comorbidities: arthritis, falls, decreased balance, history of TIAs, CVA    PT Frequency  2x / week    PT Duration  6 weeks    PT Treatment/Interventions  Balance training;Therapeutic exercise;Therapeutic activities;Patient/family education;Manual techniques;Cryotherapy;Electrical Stimulation    PT Next Visit Plan  exercise progression, sitting, standing, gait activities    PT Home Exercise Plan  hip adduction with ball and glute sets, SLR sitting, roll ball underfoot       Patient will benefit from skilled therapeutic intervention in order to improve the following deficits and impairments:  Pain, Decreased mobility, Impaired perceived functional ability, Decreased strength, Decreased range of motion, Decreased endurance, Decreased activity tolerance, Decreased balance, Decreased safety awareness, Difficulty walking, Increased muscle spasms  Visit Diagnosis: Difficulty in walking, not elsewhere classified  Muscle weakness (generalized)  Repeated falls     Problem List Patient Active Problem List   Diagnosis Date Noted  . Protein-calorie malnutrition, severe 11/08/2017  . Lumbar compression fracture ([place]) 11/05/2017  . Closed fracture of proximal end of left humerus with routine healing 08/28/2016  . Acute cystitis 04/28/2016  . Chest pain 03/14/2015  . TIA (transient ischemic attack) 01/29/2015  . Allergic arthritis, hand     Jomarie Longs PT 02/03/2018, 10:22 AM  [facility] [address], [place], [address]  Phone: [phone]   Fax:  [phone]  Name: Stephanie Harris MRN: [id number] Date of Birth: [birth year]

## 2018-02-04 NOTE — Therapy (Signed)
[facility] [address] [place], [address] Phone: [phone]   Fax:  [phone]  Physical Therapy Treatment  Patient Details  Name: Stephanie Harris MRN: [id number] Date of Birth: 1930/12/09 Referring Provider: Feliberto Gottron PA   Encounter Date: 02/03/2018  PT End of Session - 02/03/18 1453    Visit Number  11    Number of Visits  24    Date for PT Re-Evaluation  03/03/18    Authorization Type  1 of 10 progress note    PT Start Time  9937    PT Stop Time  1530    PT Time Calculation (min)  43 min    Equipment Utilized During Treatment  Gait belt;Other (comment) patient rollator    Activity Tolerance  Patient tolerated treatment well    Behavior During Therapy  [facility] for tasks assessed/performed       Past Medical History:  Diagnosis Date  . Acute bronchiolitis   . Acute pharyngitis   . Allergic rhinitis due to pollen   . Anemia, unspecified    CKD and LGL  . Anxiety state    unspecified  . Asthma without status asthmaticus    unspecified  . B12 deficiency   . Beta thalassemia trait   . Carotid artery occlusion   . Cellulitis and abscess of leg, except foot   . Cervical spondylosis   . Cervicalgia   . Chronic kidney disease   . CML (chronic myelocytic leukemia) ([place])    Onc at [facility]  . Coronary artery disease 2009   heart attack with stent  . Coronary atherosclerosis of native coronary artery   . Depressive disorder    not elsewhere classified  . Diabetes mellitus type 2, uncomplicated ([place])   . Difficulty in walking   . Disorder of breast, unspecified   . Dizziness and giddiness   . Dysuria   . Esophageal reflux   . Essential hypertension    unspecified  . Head injury    unspecified  . Heart disease   . Hematuria, unspecified   . Hyperlipidemia, unspecified   . Hypersomnia, unspecified   . Hypertension   . Hypopotassemia   . Hypothyroidism    unspecified  . ICH  (intracerebral hemorrhage) ([place])   . Ill-defined cerebrovascular disease    other  . Insomnia    unspecified  . 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 Leukemia ([place])   . Lower urinary tract infection   . Lumbago   . Mini stroke ([place])    x years  . Mixed hyperlipidemia   . Nontoxic nodular goiter    unspecified  . Occlusion and stenosis of unspecified carotid artery    without mention of cerebral infarction  . Osteoarthritis   . Osteoporosis   . Other constipation   . Other vitamin B12 deficiency anemias   . Otitis media, unspecified, unspecified ear   . Ovarian failure    unspecified  . Pain in limb   . Panic disorder without agoraphobia   . Peripheral vascular disease ([place])    unspecified  . Phlebitis of left arm   . RA (rheumatoid arthritis) ([place])   . Sigmoid polyp 1998  . Sleep disturbance    unspecified  . Stroke ([place])   . Syncope and collapse   . Thalassemia   . TIA (transient ischemic attack)    2000 and 2008 right carotid stent 08/14/2009 on Plavix  . Transient  disorder of initiating or maintaining sleep   . Varicose veins of bilateral lower extremities with other complications   . Varicose veins of bilateral lower extremities with other complications   . Vision abnormalities     Past Surgical History:  Procedure Laterality Date  . BACK SURGERY    . CARDIAC CATHETERIZATION    . CAROTID ENDARTERECTOMY Left   . CATARACT EXTRACTION    . CHOLECYSTECTOMY  1980  . COLONOSCOPY  2010  . EYELID SURGERY Bilateral 2005   BUL BLEPH  . FRACTURE SURGERY    . JOINT REPLACEMENT    . KYPHOPLASTY N/A 08/12/2017   Procedure: KYPHOPLASTY;  Surgeon: Hessie Knows, MD;  Location: [facility] ORS;  Service: Orthopedics;  Laterality: N/A;  . KYPHOPLASTY N/A 11/08/2017   Procedure: Hewitt Shorts;  Surgeon: Hessie Knows, MD;  Location: [facility] ORS;  Service: Orthopedics;  Laterality: N/A;  . LENS EYE SURGERY Left 06/16/2001   +23.5D  . LENS EYE SURGERY Right 05/28/2003    22.5D  . PERCUTANEOUS CORONARY STENT INTERVENTION (PCI-S)    . PERCUTANEOUS PLACEMENT INTRAVASCULAR STENT CERVICAL CAROTID ARTERY  06/14/2009  . ROTATOR CUFF REPAIR Left   . SUPRACERVICAL ABDOMINAL HYSTERECTOMY  1978   WITH REMOVAL TUBES &/OR OVARIES  . TOTAL KNEE ARTHROPLASTY Right     There were no vitals filed for this visit.  Subjective Assessment - 02/03/18 1452    Subjective  Patient reports she is feeling better with less back pain today.     Pertinent History  Patient fell at home 11/04/17 injuring back and underwent kyphoplasty L2 on 11/08/2017. She has history of chronic myelocytic leukemia, HTN, osteoarthritis, RA, osterporosis, TIAs and CVA. She has had multple falls at home when not using AD.     Limitations  Standing;Walking;Sitting    How long can you sit comfortably?  short periods of time    How long can you stand comfortably?  <10 min    How long can you walk comfortably?  short distances with rollator    Patient Stated Goals  decrease back pain and improve walking to reduce falls    Currently in Pain?  Yes    Pain Score  4     Pain Location  Back    Pain Orientation  Left    Pain Descriptors / Indicators  Aching;Spasm    Pain Type  Surgical pain    Pain Onset  More than a month ago    Pain Frequency  Intermittent           Objective: Gait: ambulating independently with Rolator improved posture, slow cadence, forward flexed posture  Treatment: Manual therapy: 15 min sitting in chair: STM to thoracic and lumbar spine superficial techniques for spasms and decreasing pain, bilateral paraspinal muscles   Therapeutic exercise: patient performed with instruction, demonstration, verbal and tactile cueing of therapist: goal: independent with home program, improve ambulation, strength   Sitting: hip abduction with graded manual resistance of therapist mild to moderate resistance given  x 15 reps Knee extension with 2# weight on ankles tap balance stones x  15 Knee flexion with yellow resistive band and assistance of therapist 2 x 15 each LE   UE exercises with resistive band: Scapular rows bilateral x 15 singe arm row x 15 reps each UE Straight arm pull downs x 15 palloff press to trunk x 15    Patient response to treatment: improved soft tissue elasticity and decreased spasms and tenderness by 50% following STM; improved exercise technique with  minimal cuing and facilitation of therapist.    PT Education - 02/03/18 1455    Education provided  Yes    Education Details  exercise instruction    Person(s) Educated  Patient    Methods  Explanation;Verbal cues    Comprehension  Verbalized understanding;Verbal cues required          PT Long Term Goals - 01/20/18 1615      PT LONG TERM GOAL #1   Title  Patient will complete a TUG test in <20 seconds with an AD for independent mobility and decreased fall risk     Baseline  37 seconds with rollator    Status  Revised    Target Date  03/03/18      PT LONG TERM GOAL #2   Title  Patient  will complete 5x/sit to stand test in < 25 seconds indicating improved strength and decreased fall risk    Status  Revised    Target Date  03/03/18      PT LONG TERM GOAL #3   Title  improved 10Mw test iin 15 seconds with rollator and close supervision demonstrating improvement with ambulation and decreased fall risk    Baseline  10MW 25.5 seconds (.39 m/s)    Status  Revised    Target Date  03/03/18      PT LONG TERM GOAL #4   Title  Patient will be independent with home program for strength and balance activities to allow transition to self management     Baseline  requries instruction, guidance, cuing for exercises    Status  Revised    Target Date  03/03/18            Plan - 02/03/18 1534    Clinical Impression Statement  Patient demonstrated improvement with exercises with minimal assistance and cuing. She is responding well to current treatment interventions and should continue to  progress with additional phyiscal therapy treatment.     Rehab Potential  Good    Clinical Impairments Affecting Rehab Potential  (+)motivated, acute condition(-)multiple comorbidities: arthritis, falls, decreased balance, history of TIAs, CVA    PT Frequency  2x / week    PT Duration  6 weeks    PT Treatment/Interventions  Balance training;Therapeutic exercise;Therapeutic activities;Patient/family education;Manual techniques;Cryotherapy;Electrical Stimulation    PT Next Visit Plan  exercise progression, sitting, standing, gait activities    PT Home Exercise Plan  hip adduction with ball and glute sets, SLR sitting, roll ball underfoot       Patient will benefit from skilled therapeutic intervention in order to improve the following deficits and impairments:  Pain, Decreased mobility, Impaired perceived functional ability, Decreased strength, Decreased range of motion, Decreased endurance, Decreased activity tolerance, Decreased balance, Decreased safety awareness, Difficulty walking, Increased muscle spasms  Visit Diagnosis: Difficulty in walking, not elsewhere classified  Muscle weakness (generalized)  Repeated falls     Problem List Patient Active Problem List   Diagnosis Date Noted  . Protein-calorie malnutrition, severe 11/08/2017  . Lumbar compression fracture ([place]) 11/05/2017  . Closed fracture of proximal end of left humerus with routine healing 08/28/2016  . Acute cystitis 04/28/2016  . Chest pain 03/14/2015  . TIA (transient ischemic attack) 01/29/2015  . Allergic arthritis, hand     Jomarie Longs PT 02/04/2018, 12:21 PM  [facility] [address] [place], [address] Phone: [phone]   Fax:  [phone]  Name: GERDA YIN MRN: [id number]  Date of Birth: 08-04-1931

## 2018-02-07 ENCOUNTER — Ambulatory Visit: Payer: Medicare Other | Admitting: Physical Therapy

## 2018-02-09 ENCOUNTER — Encounter: Payer: Self-pay | Admitting: Physical Therapy

## 2018-02-09 ENCOUNTER — Ambulatory Visit: Payer: Medicare Other | Admitting: Physical Therapy

## 2018-02-09 DIAGNOSIS — R296 Repeated falls: Secondary | ICD-10-CM | POA: Diagnosis not present

## 2018-02-09 DIAGNOSIS — M6281 Muscle weakness (generalized): Secondary | ICD-10-CM | POA: Diagnosis not present

## 2018-02-09 DIAGNOSIS — R262 Difficulty in walking, not elsewhere classified: Secondary | ICD-10-CM

## 2018-02-09 NOTE — Therapy (Signed)
[facility] [address], [place], [address] Phone: [phone]   Fax:  [phone]  Physical Therapy Treatment  Patient Details  Name: Stephanie Harris MRN: [id number] Date of Birth: 04-25-1931 Referring Provider: Feliberto Gottron PA   Encounter Date: 02/09/2018  PT End of Session - 02/09/18 1615    Visit Number  12    Number of Visits  24    Date for PT Re-Evaluation  03/03/18    Authorization Type  2 of 10 progress note    PT Start Time  1520    PT Stop Time  1606    PT Time Calculation (min)  46 min    Equipment Utilized During Treatment  Other (comment) patient rollator    Activity Tolerance  Patient tolerated treatment well    Behavior During Therapy  [facility] for tasks assessed/performed       Past Medical History:  Diagnosis Date  . Acute bronchiolitis   . Acute pharyngitis   . Allergic rhinitis due to pollen   . Anemia, unspecified    CKD and LGL  . Anxiety state    unspecified  . Asthma without status asthmaticus    unspecified  . B12 deficiency   . Beta thalassemia trait   . Carotid artery occlusion   . Cellulitis and abscess of leg, except foot   . Cervical spondylosis   . Cervicalgia   . Chronic kidney disease   . CML (chronic myelocytic leukemia) ([place])    Onc at [facility]  . Coronary artery disease 2009   heart attack with stent  . Coronary atherosclerosis of native coronary artery   . Depressive disorder    not elsewhere classified  . Diabetes mellitus type 2, uncomplicated ([place])   . Difficulty in walking   . Disorder of breast, unspecified   . Dizziness and giddiness   . Dysuria   . Esophageal reflux   . Essential hypertension    unspecified  . Head injury    unspecified  . Heart disease   . Hematuria, unspecified   . Hyperlipidemia, unspecified   . Hypersomnia, unspecified   . Hypertension   . Hypopotassemia   . Hypothyroidism    unspecified  . ICH  (intracerebral hemorrhage) ([place])   . Ill-defined cerebrovascular disease    other  . Insomnia    unspecified  . 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 Leukemia ([place])   . Lower urinary tract infection   . Lumbago   . Mini stroke ([place])    x years  . Mixed hyperlipidemia   . Nontoxic nodular goiter    unspecified  . Occlusion and stenosis of unspecified carotid artery    without mention of cerebral infarction  . Osteoarthritis   . Osteoporosis   . Other constipation   . Other vitamin B12 deficiency anemias   . Otitis media, unspecified, unspecified ear   . Ovarian failure    unspecified  . Pain in limb   . Panic disorder without agoraphobia   . Peripheral vascular disease ([place])    unspecified  . Phlebitis of left arm   . RA (rheumatoid arthritis) ([place])   . Sigmoid polyp 1998  . Sleep disturbance    unspecified  . Stroke ([place])   . Syncope and collapse   . Thalassemia   . TIA (transient ischemic attack)    2000 and 2008 right carotid stent 08/14/2009 on Plavix  . Transient disorder  of initiating or maintaining sleep   . Varicose veins of bilateral lower extremities with other complications   . Varicose veins of bilateral lower extremities with other complications   . Vision abnormalities     Past Surgical History:  Procedure Laterality Date  . BACK SURGERY    . CARDIAC CATHETERIZATION    . CAROTID ENDARTERECTOMY Left   . CATARACT EXTRACTION    . CHOLECYSTECTOMY  1980  . COLONOSCOPY  2010  . EYELID SURGERY Bilateral 2005   BUL BLEPH  . FRACTURE SURGERY    . JOINT REPLACEMENT    . KYPHOPLASTY N/A 08/12/2017   Procedure: KYPHOPLASTY;  Surgeon: Hessie Knows, MD;  Location: [facility] ORS;  Service: Orthopedics;  Laterality: N/A;  . KYPHOPLASTY N/A 11/08/2017   Procedure: Hewitt Shorts;  Surgeon: Hessie Knows, MD;  Location: [facility] ORS;  Service: Orthopedics;  Laterality: N/A;  . LENS EYE SURGERY Left 06/16/2001   +23.5D  . LENS EYE SURGERY Right 05/28/2003    22.5D  . PERCUTANEOUS CORONARY STENT INTERVENTION (PCI-S)    . PERCUTANEOUS PLACEMENT INTRAVASCULAR STENT CERVICAL CAROTID ARTERY  06/14/2009  . ROTATOR CUFF REPAIR Left   . SUPRACERVICAL ABDOMINAL HYSTERECTOMY  1978   WITH REMOVAL TUBES &/OR OVARIES  . TOTAL KNEE ARTHROPLASTY Right     There were no vitals filed for this visit.  Subjective Assessment - 02/09/18 1525    Subjective  Patient is having less pain in back and able to do more at home.     Pertinent History  Patient fell at home 11/04/17 injuring back and underwent kyphoplasty L2 on 11/08/2017. She has history of chronic myelocytic leukemia, HTN, osteoarthritis, RA, osterporosis, TIAs and CVA. She has had multple falls at home when not using AD.     Limitations  Standing;Walking;Sitting    How long can you sit comfortably?  short periods of time    How long can you stand comfortably?  <10 min    How long can you walk comfortably?  short distances with rollator    Patient Stated Goals  decrease back pain and improve walking to reduce falls    Currently in Pain?  Yes    Pain Score  3     Pain Location  Back    Pain Orientation  Left    Pain Descriptors / Indicators  Aching;Spasm    Pain Type  Surgical pain    Pain Onset  More than a month ago    Pain Frequency  Intermittent          Objective: gait: ambulating with rollator independently with forward flexed posture and moderate forward head posture; able to correct with verbal cuing palpation: tender, spasms palpable along left side thoracic paraspinal muscles and tender along bilateral paraspinal muscles thoracic to lumbar spine  Treatment: Manual therapy: 15 min sitting in chair: STM to thoracic and lumbar spine superficial techniques for spasms and decreasing pain, bilateral paraspinal muscles   Therapeutic exercise: patient performed with instruction, demonstration, verbal and tactile cueing of therapist: goal: independent with home program, improve ambulation,  strength   Sitting: hip abduction with red resistive band  x 15 reps Knee extension with 3# weight on ankles tap balance stones x 25 Knee flexion with res resistive band and assistance of therapist 2 x 15 each LE   UE exercises with red resistive band: Scapular rows bilateral x 20 singe arm row doubled resistive band x 15 reps each UE Straight arm pull downs x 15 palloff press to trunk  x 25    Patient response to treatment: patient demonstrated improved endurance with increased resistance using red instead of yellow band andincreased weight from 2 to 3#. patient required minimal cuing to perform exercises with correct, improved technique. improved soft tissue elasticity and decreased spasms and tenderness by 50% following STM     PT Education - 02/09/18 1614    Education provided  Yes    Education Details  exercise instruction    Person(s) Educated  Patient    Methods  Explanation;Verbal cues    Comprehension  Verbalized understanding;Verbal cues required          PT Long Term Goals - 01/20/18 1615      PT LONG TERM GOAL #1   Title  Patient will complete a TUG test in <20 seconds with an AD for independent mobility and decreased fall risk     Baseline  37 seconds with rollator    Status  Revised    Target Date  03/03/18      PT LONG TERM GOAL #2   Title  Patient  will complete 5x/sit to stand test in < 25 seconds indicating improved strength and decreased fall risk    Status  Revised    Target Date  03/03/18      PT LONG TERM GOAL #3   Title  improved 10Mw test iin 15 seconds with rollator and close supervision demonstrating improvement with ambulation and decreased fall risk    Baseline  10MW 25.5 seconds (.39 m/s)    Status  Revised    Target Date  03/03/18      PT LONG TERM GOAL #4   Title  Patient will be independent with home program for strength and balance activities to allow transition to self management     Baseline  requries instruction, guidance, cuing  for exercises    Status  Revised    Target Date  03/03/18            Plan - 02/09/18 1617    Clinical Impression Statement  Patient demonstrates improving strength and endurance with exercises and is improving function at home.  She continues to require guidance and instruction to perform exercises through full ROM and with correct alignment and will benefit from continued physical therapy intervention to achieve maximal function.    Rehab Potential  Good    Clinical Impairments Affecting Rehab Potential  (+)motivated, acute condition(-)multiple comorbidities: arthritis, falls, decreased balance, history of TIAs, CVA    PT Frequency  2x / week    PT Duration  6 weeks    PT Treatment/Interventions  Balance training;Therapeutic exercise;Therapeutic activities;Patient/family education;Manual techniques;Cryotherapy;Electrical Stimulation    PT Next Visit Plan  exercise progression, sitting, standing, gait activities    PT Home Exercise Plan  hip adduction with ball and glute sets, SLR sitting, roll ball underfoot       Patient will benefit from skilled therapeutic intervention in order to improve the following deficits and impairments:  Pain, Decreased mobility, Impaired perceived functional ability, Decreased strength, Decreased range of motion, Decreased endurance, Decreased activity tolerance, Decreased balance, Decreased safety awareness, Difficulty walking, Increased muscle spasms  Visit Diagnosis: Difficulty in walking, not elsewhere classified  Muscle weakness (generalized)  Repeated falls     Problem List Patient Active Problem List   Diagnosis Date Noted  . Protein-calorie malnutrition, severe 11/08/2017  . Lumbar compression fracture ([place]) 11/05/2017  . Closed fracture of proximal end of left humerus with routine healing 08/28/2016  .  Acute cystitis 04/28/2016  . Chest pain 03/14/2015  . TIA (transient ischemic attack) 01/29/2015  . Allergic arthritis, hand      Jomarie Longs PT 02/09/2018, 4:29 PM  [facility] [address], [place], [address] Phone: [phone]   Fax:  [phone]  Name: Stephanie Harris MRN: [id number] Date of Birth: [birth year]

## 2018-02-10 ENCOUNTER — Ambulatory Visit: Payer: Medicare Other | Admitting: Physical Therapy

## 2018-02-10 DIAGNOSIS — E1122 Type 2 diabetes mellitus with diabetic chronic kidney disease: Secondary | ICD-10-CM | POA: Diagnosis not present

## 2018-02-10 DIAGNOSIS — D704 Cyclic neutropenia: Secondary | ICD-10-CM | POA: Diagnosis not present

## 2018-02-10 DIAGNOSIS — Z87440 Personal history of urinary (tract) infections: Secondary | ICD-10-CM | POA: Diagnosis not present

## 2018-02-10 DIAGNOSIS — D588 Other specified hereditary hemolytic anemias: Secondary | ICD-10-CM | POA: Diagnosis not present

## 2018-02-10 DIAGNOSIS — N183 Chronic kidney disease, stage 3 (moderate): Secondary | ICD-10-CM | POA: Diagnosis not present

## 2018-02-10 DIAGNOSIS — D631 Anemia in chronic kidney disease: Secondary | ICD-10-CM | POA: Diagnosis not present

## 2018-02-10 DIAGNOSIS — R531 Weakness: Secondary | ICD-10-CM | POA: Diagnosis not present

## 2018-02-10 DIAGNOSIS — D563 Thalassemia minor: Secondary | ICD-10-CM | POA: Diagnosis not present

## 2018-02-10 DIAGNOSIS — D72820 Lymphocytosis (symptomatic): Secondary | ICD-10-CM | POA: Diagnosis not present

## 2018-02-10 DIAGNOSIS — I129 Hypertensive chronic kidney disease with stage 1 through stage 4 chronic kidney disease, or unspecified chronic kidney disease: Secondary | ICD-10-CM | POA: Diagnosis not present

## 2018-02-10 DIAGNOSIS — D6489 Other specified anemias: Secondary | ICD-10-CM | POA: Diagnosis not present

## 2018-02-10 DIAGNOSIS — Z79899 Other long term (current) drug therapy: Secondary | ICD-10-CM | POA: Diagnosis not present

## 2018-02-14 ENCOUNTER — Encounter: Payer: Self-pay | Admitting: Physical Therapy

## 2018-02-14 ENCOUNTER — Ambulatory Visit: Payer: Medicare Other | Admitting: Physical Therapy

## 2018-02-14 DIAGNOSIS — R296 Repeated falls: Secondary | ICD-10-CM

## 2018-02-14 DIAGNOSIS — R262 Difficulty in walking, not elsewhere classified: Secondary | ICD-10-CM | POA: Diagnosis not present

## 2018-02-14 DIAGNOSIS — M6281 Muscle weakness (generalized): Secondary | ICD-10-CM

## 2018-02-14 NOTE — Therapy (Signed)
[facility] [address], [place], [address] Phone: [phone]   Fax:  [phone]  Physical Therapy Treatment  Patient Details  Name: Stephanie Harris MRN: [id number] Date of Birth: 1931/04/23 Referring Provider: Feliberto Gottron PA   Encounter Date: 02/14/2018  PT End of Session - 02/14/18 1447    Visit Number  13    Number of Visits  24    Date for PT Re-Evaluation  03/03/18    Authorization Type  3 of 10 progress note    PT Start Time  1440    PT Stop Time  1520    PT Time Calculation (min)  40 min    Equipment Utilized During Treatment  Other (comment) patient rollator    Activity Tolerance  Patient tolerated treatment well    Behavior During Therapy  [facility] for tasks assessed/performed       Past Medical History:  Diagnosis Date  . Acute bronchiolitis   . Acute pharyngitis   . Allergic rhinitis due to pollen   . Anemia, unspecified    CKD and LGL  . Anxiety state    unspecified  . Asthma without status asthmaticus    unspecified  . B12 deficiency   . Beta thalassemia trait   . Carotid artery occlusion   . Cellulitis and abscess of leg, except foot   . Cervical spondylosis   . Cervicalgia   . Chronic kidney disease   . CML (chronic myelocytic leukemia) ([place])    Onc at [facility]  . Coronary artery disease 2009   heart attack with stent  . Coronary atherosclerosis of native coronary artery   . Depressive disorder    not elsewhere classified  . Diabetes mellitus type 2, uncomplicated ([place])   . Difficulty in walking   . Disorder of breast, unspecified   . Dizziness and giddiness   . Dysuria   . Esophageal reflux   . Essential hypertension    unspecified  . Head injury    unspecified  . Heart disease   . Hematuria, unspecified   . Hyperlipidemia, unspecified   . Hypersomnia, unspecified   . Hypertension   . Hypopotassemia   . Hypothyroidism    unspecified  . ICH  (intracerebral hemorrhage) ([place])   . Ill-defined cerebrovascular disease    other  . Insomnia    unspecified  . 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 Leukemia ([place])   . Lower urinary tract infection   . Lumbago   . Mini stroke ([place])    x years  . Mixed hyperlipidemia   . Nontoxic nodular goiter    unspecified  . Occlusion and stenosis of unspecified carotid artery    without mention of cerebral infarction  . Osteoarthritis   . Osteoporosis   . Other constipation   . Other vitamin B12 deficiency anemias   . Otitis media, unspecified, unspecified ear   . Ovarian failure    unspecified  . Pain in limb   . Panic disorder without agoraphobia   . Peripheral vascular disease ([place])    unspecified  . Phlebitis of left arm   . RA (rheumatoid arthritis) ([place])   . Sigmoid polyp 1998  . Sleep disturbance    unspecified  . Stroke ([place])   . Syncope and collapse   . Thalassemia   . TIA (transient ischemic attack)    2000 and 2008 right carotid stent 08/14/2009 on Plavix  . Transient disorder  of initiating or maintaining sleep   . Varicose veins of bilateral lower extremities with other complications   . Varicose veins of bilateral lower extremities with other complications   . Vision abnormalities     Past Surgical History:  Procedure Laterality Date  . BACK SURGERY    . CARDIAC CATHETERIZATION    . CAROTID ENDARTERECTOMY Left   . CATARACT EXTRACTION    . CHOLECYSTECTOMY  1980  . COLONOSCOPY  2010  . EYELID SURGERY Bilateral 2005   BUL BLEPH  . FRACTURE SURGERY    . JOINT REPLACEMENT    . KYPHOPLASTY N/A 08/12/2017   Procedure: KYPHOPLASTY;  Surgeon: Hessie Knows, MD;  Location: [facility] ORS;  Service: Orthopedics;  Laterality: N/A;  . KYPHOPLASTY N/A 11/08/2017   Procedure: Hewitt Shorts;  Surgeon: Hessie Knows, MD;  Location: [facility] ORS;  Service: Orthopedics;  Laterality: N/A;  . LENS EYE SURGERY Left 06/16/2001   +23.5D  . LENS EYE SURGERY Right 05/28/2003    22.5D  . PERCUTANEOUS CORONARY STENT INTERVENTION (PCI-S)    . PERCUTANEOUS PLACEMENT INTRAVASCULAR STENT CERVICAL CAROTID ARTERY  06/14/2009  . ROTATOR CUFF REPAIR Left   . SUPRACERVICAL ABDOMINAL HYSTERECTOMY  1978   WITH REMOVAL TUBES &/OR OVARIES  . TOTAL KNEE ARTHROPLASTY Right     There were no vitals filed for this visit.      Objective: gait: ambulating with rollator independently with forward flexed posture and moderate forward head posture; able to correct independently without cuing palpation: tender, spasms palpable along left side lumbar spine paraspinal muscles and tender along bilateral paraspinal muscles thoracic spine   Treatment: Manual therapy: 15 min sitting in chair: STM to thoracic and lumbar spine superficial techniques for spasms and decreasing pain, bilateral paraspinal muscles   Therapeutic exercise: patient performed with instruction, demonstration, verbal and tactile cueing of therapist: goal: independent with home program, improve ambulation, strength   Sitting: hip abduction with red resistive band  x 15 reps Knee extension with 3# weight on ankles tap balance stones x 20 Knee flexion with red resistive band and assistance of therapist  x 15 each LE standing side stepping on balance beam x 3 sets back and forth with close supervision/contact guard and patient using UE's for support/balance  UE exercises with red resistive band: Scapular rows bilateral x 20 Straight arm pull downs x 15 palloff press to trunk x 25    Patient response to treatment: patient demonstrated improved motor control and technique with exercises with repetition and minimal cerbal cuing. improved soft tissue elasticity with decreased spasms by 30% following STM.       PT Education - 02/14/18 1635    Education provided  Yes    Education Details  exercise instruction    Person(s) Educated  Patient    Methods  Explanation;Verbal cues    Comprehension  Verbal cues  required;Verbalized understanding          PT Long Term Goals - 01/20/18 1615      PT LONG TERM GOAL #1   Title  Patient will complete a TUG test in <20 seconds with an AD for independent mobility and decreased fall risk     Baseline  37 seconds with rollator    Status  Revised    Target Date  03/03/18      PT LONG TERM GOAL #2   Title  Patient  will complete 5x/sit to stand test in < 25 seconds indicating improved strength and decreased fall risk  Status  Revised    Target Date  03/03/18      PT LONG TERM GOAL #3   Title  improved 10Mw test iin 15 seconds with rollator and close supervision demonstrating improvement with ambulation and decreased fall risk    Baseline  10MW 25.5 seconds (.39 m/s)    Status  Revised    Target Date  03/03/18      PT LONG TERM GOAL #4   Title  Patient will be independent with home program for strength and balance activities to allow transition to self management     Baseline  requries instruction, guidance, cuing for exercises    Status  Revised    Target Date  03/03/18            Plan - 02/14/18 1600    Clinical Impression Statement  Patient demonstrates improved strength and balance with exercises wtih repetition and demonstration. She continues with decreased posture, endurance and strength and will benefit from continued physical therapy intervention to achieve goals.    Rehab Potential  Good    Clinical Impairments Affecting Rehab Potential  (+)motivated, acute condition(-)multiple comorbidities: arthritis, falls, decreased balance, history of TIAs, CVA    PT Frequency  2x / week    PT Duration  6 weeks    PT Treatment/Interventions  Balance training;Therapeutic exercise;Therapeutic activities;Patient/family education;Manual techniques;Cryotherapy;Electrical Stimulation    PT Next Visit Plan  exercise progression, sitting, standing, gait activities    PT Home Exercise Plan  hip adduction with ball and glute sets, SLR sitting, roll  ball underfoot       Patient will benefit from skilled therapeutic intervention in order to improve the following deficits and impairments:  Pain, Decreased mobility, Impaired perceived functional ability, Decreased strength, Decreased range of motion, Decreased endurance, Decreased activity tolerance, Decreased balance, Decreased safety awareness, Difficulty walking, Increased muscle spasms  Visit Diagnosis: Difficulty in walking, not elsewhere classified  Muscle weakness (generalized)  Repeated falls     Problem List Patient Active Problem List   Diagnosis Date Noted  . Protein-calorie malnutrition, severe 11/08/2017  . Lumbar compression fracture ([place]) 11/05/2017  . Closed fracture of proximal end of left humerus with routine healing 08/28/2016  . Acute cystitis 04/28/2016  . Chest pain 03/14/2015  . TIA (transient ischemic attack) 01/29/2015  . Allergic arthritis, hand     Jomarie Longs PT 02/14/2018, 4:36 PM  [facility] [address], [place], [address] Phone: [phone]   Fax:  [phone]  Name: Stephanie Harris MRN: [id number] Date of Birth: 1930/11/06

## 2018-02-17 ENCOUNTER — Ambulatory Visit: Payer: Medicare Other | Admitting: Physical Therapy

## 2018-02-22 ENCOUNTER — Encounter: Payer: Self-pay | Admitting: Physical Therapy

## 2018-02-22 ENCOUNTER — Ambulatory Visit: Payer: Medicare Other | Attending: Orthopedic Surgery | Admitting: Physical Therapy

## 2018-02-22 DIAGNOSIS — R262 Difficulty in walking, not elsewhere classified: Secondary | ICD-10-CM

## 2018-02-22 DIAGNOSIS — R296 Repeated falls: Secondary | ICD-10-CM | POA: Diagnosis not present

## 2018-02-22 DIAGNOSIS — M6281 Muscle weakness (generalized): Secondary | ICD-10-CM | POA: Insufficient documentation

## 2018-02-22 NOTE — Therapy (Signed)
[facility] [address] [place], [address] Phone: [phone]   Fax:  [phone]  Physical Therapy Treatment  Patient Details  Name: Stephanie Harris MRN: [id number] Date of Birth: 20-Dec-1930 Referring Provider: Feliberto Gottron PA   Encounter Date: 02/22/2018  PT End of Session - 02/22/18 1446    Visit Number  14    Number of Visits  24    Date for PT Re-Evaluation  03/03/18    Authorization Type  4 of 10 progress note    PT Start Time  1440    PT Stop Time  1520    PT Time Calculation (min)  40 min    Equipment Utilized During Treatment  Other (comment) patient rollator    Activity Tolerance  Patient tolerated treatment well    Behavior During Therapy  [facility] for tasks assessed/performed       Past Medical History:  Diagnosis Date  . Acute bronchiolitis   . Acute pharyngitis   . Allergic rhinitis due to pollen   . Anemia, unspecified    CKD and LGL  . Anxiety state    unspecified  . Asthma without status asthmaticus    unspecified  . B12 deficiency   . Beta thalassemia trait   . Carotid artery occlusion   . Cellulitis and abscess of leg, except foot   . Cervical spondylosis   . Cervicalgia   . Chronic kidney disease   . CML (chronic myelocytic leukemia) ([place])    Onc at [facility]  . Coronary artery disease 2009   heart attack with stent  . Coronary atherosclerosis of native coronary artery   . Depressive disorder    not elsewhere classified  . Diabetes mellitus type 2, uncomplicated ([place])   . Difficulty in walking   . Disorder of breast, unspecified   . Dizziness and giddiness   . Dysuria   . Esophageal reflux   . Essential hypertension    unspecified  . Head injury    unspecified  . Heart disease   . Hematuria, unspecified   . Hyperlipidemia, unspecified   . Hypersomnia, unspecified   . Hypertension   . Hypopotassemia   . Hypothyroidism    unspecified  . ICH  (intracerebral hemorrhage) ([place])   . Ill-defined cerebrovascular disease    other  . Insomnia    unspecified  . 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 Leukemia ([place])   . Lower urinary tract infection   . Lumbago   . Mini stroke ([place])    x years  . Mixed hyperlipidemia   . Nontoxic nodular goiter    unspecified  . Occlusion and stenosis of unspecified carotid artery    without mention of cerebral infarction  . Osteoarthritis   . Osteoporosis   . Other constipation   . Other vitamin B12 deficiency anemias   . Otitis media, unspecified, unspecified ear   . Ovarian failure    unspecified  . Pain in limb   . Panic disorder without agoraphobia   . Peripheral vascular disease ([place])    unspecified  . Phlebitis of left arm   . RA (rheumatoid arthritis) ([place])   . Sigmoid polyp 1998  . Sleep disturbance    unspecified  . Stroke ([place])   . Syncope and collapse   . Thalassemia   . TIA (transient ischemic attack)    2000 and 2008 right carotid stent 08/14/2009 on Plavix  . Transient disorder  of initiating or maintaining sleep   . Varicose veins of bilateral lower extremities with other complications   . Varicose veins of bilateral lower extremities with other complications   . Vision abnormalities     Past Surgical History:  Procedure Laterality Date  . BACK SURGERY    . CARDIAC CATHETERIZATION    . CAROTID ENDARTERECTOMY Left   . CATARACT EXTRACTION    . CHOLECYSTECTOMY  1980  . COLONOSCOPY  2010  . EYELID SURGERY Bilateral 2005   BUL BLEPH  . FRACTURE SURGERY    . JOINT REPLACEMENT    . KYPHOPLASTY N/A 08/12/2017   Procedure: KYPHOPLASTY;  Surgeon: Hessie Knows, MD;  Location: [facility] ORS;  Service: Orthopedics;  Laterality: N/A;  . KYPHOPLASTY N/A 11/08/2017   Procedure: Hewitt Shorts;  Surgeon: Hessie Knows, MD;  Location: [facility] ORS;  Service: Orthopedics;  Laterality: N/A;  . LENS EYE SURGERY Left 06/16/2001   +23.5D  . LENS EYE SURGERY Right 05/28/2003    22.5D  . PERCUTANEOUS CORONARY STENT INTERVENTION (PCI-S)    . PERCUTANEOUS PLACEMENT INTRAVASCULAR STENT CERVICAL CAROTID ARTERY  06/14/2009  . ROTATOR CUFF REPAIR Left   . SUPRACERVICAL ABDOMINAL HYSTERECTOMY  1978   WITH REMOVAL TUBES &/OR OVARIES  . TOTAL KNEE ARTHROPLASTY Right     There were no vitals filed for this visit.  Subjective Assessment - 02/22/18 1443    Subjective  Patient reports her back is still hurting but her knees are hurting more than her back today.     Pertinent History  Patient fell at home 11/04/17 injuring back and underwent kyphoplasty L2 on 11/08/2017. She has history of chronic myelocytic leukemia, HTN, osteoarthritis, RA, osterporosis, TIAs and CVA. She has had multple falls at home when not using AD.     Limitations  Standing;Walking;Sitting    How long can you sit comfortably?  short periods of time    How long can you stand comfortably?  <10 min    How long can you walk comfortably?  short distances with rollator    Patient Stated Goals  decrease back pain and improve walking to reduce falls    Currently in Pain?  Yes    Pain Score  5     Pain Location  Back    Pain Orientation  Left    Pain Descriptors / Indicators  Aching    Pain Type  Surgical pain 11/08/2017    Pain Onset  More than a month ago    Pain Frequency  Intermittent         Objective: gait: ambulating with rollator independently with forward flexed posture and moderate forward head posture palpation: tender, mild spasms palpable along lumbar spine paraspinal muscles and tender along bilateral paraspinal muscles thoracic spine   Treatment: Manual therapy: 15 min sitting in chair: STM to thoracic and lumbar spine superficial techniques for spasms and decreasing pain, bilateral paraspinal muscles   Therapeutic exercise: patient performed with instruction, demonstration, verbal and tactile cueing of therapist: goal: independent with home program, improve ambulation, strength    Sitting: hip abduction with red resistive band  2 x 15 reps hip flexion with red resistive band 2 x 10 reps each LE Knee extension with 2# weight on ankles  LAQ x15 Knee flexion with red resistive band and assistance of therapist  x 15 each LE   UE exercises with red resistive band: Scapular rows bilateral x 20 Straight arm pull downs x 15  Modalities: Moist heat applied to back during  sitting exercises with no adverse effects noted (unbilled)    Patient response to treatment: patient demonstrated improved motor control and technique with exercises with repetition and minimal verbal cuing. soft tissue elasticity with mild to no spasms palpable with treatment. Moderate fatigue reported at end of session      PT Education - 02/22/18 1500    Education provided  Yes    Education Details  exercise instruction for technique    Person(s) Educated  Patient    Methods  Explanation;Verbal cues;Demonstration    Comprehension  Verbalized understanding;Verbal cues required;Returned demonstration          PT Long Term Goals - 01/20/18 1615      PT LONG TERM GOAL #1   Title  Patient will complete a TUG test in <20 seconds with an AD for independent mobility and decreased fall risk     Baseline  37 seconds with rollator    Status  Revised    Target Date  03/03/18      PT LONG TERM GOAL #2   Title  Patient  will complete 5x/sit to stand test in < 25 seconds indicating improved strength and decreased fall risk    Status  Revised    Target Date  03/03/18      PT LONG TERM GOAL #3   Title  improved 10Mw test iin 15 seconds with rollator and close supervision demonstrating improvement with ambulation and decreased fall risk    Baseline  10MW 25.5 seconds (.39 m/s)    Status  Revised    Target Date  03/03/18      PT LONG TERM GOAL #4   Title  Patient will be independent with home program for strength and balance activities to allow transition to self management     Baseline  requries  instruction, guidance, cuing for exercises    Status  Revised    Target Date  03/03/18            Plan - 02/22/18 1524    Clinical Impression Statement  Patient improved soft tissue elasticity, decreased tenderness in back muscluature and was able to perform exercises with improved technique with minimal cuing. Fatigue and decreased endurance limit progression with exercises to standing and walking activities and chronic leukemia and low hemoglobin are limiting factors. She should continue to progress with additional physical therapy intervention with anticipated discharge to home program next week.      Rehab Potential  Good    Clinical Impairments Affecting Rehab Potential  (+)motivated, acute condition(-)multiple comorbidities: arthritis, falls, decreased balance, history of TIAs, CVA    PT Frequency  2x / week    PT Duration  6 weeks    PT Treatment/Interventions  Balance training;Therapeutic exercise;Therapeutic activities;Patient/family education;Manual techniques;Cryotherapy;Electrical Stimulation    PT Next Visit Plan  exercise progression, sitting, standing, gait activities as tolerated    PT Home Exercise Plan  hip adduction with ball and glute sets, SLR sitting, roll ball underfoot       Patient will benefit from skilled therapeutic intervention in order to improve the following deficits and impairments:  Pain, Decreased mobility, Impaired perceived functional ability, Decreased strength, Decreased range of motion, Decreased endurance, Decreased activity tolerance, Decreased balance, Decreased safety awareness, Difficulty walking, Increased muscle spasms  Visit Diagnosis: Difficulty in walking, not elsewhere classified  Muscle weakness (generalized)  Repeated falls     Problem List Patient Active Problem List   Diagnosis Date Noted  . Protein-calorie malnutrition, severe 11/08/2017  .  Lumbar compression fracture ([place]) 11/05/2017  . Closed fracture of proximal end of  left humerus with routine healing 08/28/2016  . Acute cystitis 04/28/2016  . Chest pain 03/14/2015  . TIA (transient ischemic attack) 01/29/2015  . Allergic arthritis, hand     Jomarie Longs PT 02/23/2018, 9:47 AM  [facility] [address] [place], [address] Phone: [phone]   Fax:  [phone]  Name: Stephanie Harris MRN: [id number] Date of Birth: October 29, 1930

## 2018-02-23 DIAGNOSIS — D649 Anemia, unspecified: Secondary | ICD-10-CM | POA: Diagnosis not present

## 2018-03-01 ENCOUNTER — Ambulatory Visit: Payer: Medicare Other | Admitting: Physical Therapy

## 2018-03-03 ENCOUNTER — Encounter: Payer: Self-pay | Admitting: Physical Therapy

## 2018-03-03 ENCOUNTER — Ambulatory Visit: Payer: Medicare Other | Admitting: Physical Therapy

## 2018-03-03 DIAGNOSIS — R262 Difficulty in walking, not elsewhere classified: Secondary | ICD-10-CM | POA: Diagnosis not present

## 2018-03-03 DIAGNOSIS — M6281 Muscle weakness (generalized): Secondary | ICD-10-CM

## 2018-03-03 DIAGNOSIS — R296 Repeated falls: Secondary | ICD-10-CM | POA: Diagnosis not present

## 2018-03-03 NOTE — Therapy (Signed)
[facility] [address] [place], [address] Phone: [phone]   Fax:  [phone]  Physical Therapy Treatment/ Discharge Summary  Patient Details  Name: Stephanie Harris MRN: [id number] Date of Birth: 02/08/1931 Referring Provider: Feliberto Gottron PA   Encounter Date: 03/03/2018   Patient began physical therapy on 12/02/2017 and has attended 15 sessions through 03/03/2018. She has achieved goals #2 and 4 and partially met goals #1and 3 and is independent in home program for continued self management of pain/symptoms and exercises as instructed. Plan discharge from physical therapy at this time.    PT End of Session - 03/03/18 1431    Visit Number  15    Number of Visits  24    Date for PT Re-Evaluation  03/03/18    Authorization Type  5 of 10 progress note    PT Start Time  1427    PT Stop Time  1500    PT Time Calculation (min)  33 min    Equipment Utilized During Treatment  Other (comment) patient rollator    Activity Tolerance  Patient tolerated treatment well    Behavior During Therapy  WFL for tasks assessed/performed       Past Medical History:  Diagnosis Date  . Acute bronchiolitis   . Acute pharyngitis   . Allergic rhinitis due to pollen   . Anemia, unspecified    CKD and LGL  . Anxiety state    unspecified  . Asthma without status asthmaticus    unspecified  . B12 deficiency   . Beta thalassemia trait   . Carotid artery occlusion   . Cellulitis and abscess of leg, except foot   . Cervical spondylosis   . Cervicalgia   . Chronic kidney disease   . CML (chronic myelocytic leukemia) ([place])    Onc at [facility]  . Coronary artery disease 2009   heart attack with stent  . Coronary atherosclerosis of native coronary artery   . Depressive disorder    not elsewhere classified  . Diabetes mellitus type 2, uncomplicated ([place])   . Difficulty in walking   . Disorder of breast, unspecified    . Dizziness and giddiness   . Dysuria   . Esophageal reflux   . Essential hypertension    unspecified  . Head injury    unspecified  . Heart disease   . Hematuria, unspecified   . Hyperlipidemia, unspecified   . Hypersomnia, unspecified   . Hypertension   . Hypopotassemia   . Hypothyroidism    unspecified  . ICH (intracerebral hemorrhage) ([place])   . Ill-defined cerebrovascular disease    other  . Insomnia    unspecified  . 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 Leukemia ([place])   . Lower urinary tract infection   . Lumbago   . Mini stroke ([place])    x years  . Mixed hyperlipidemia   . Nontoxic nodular goiter    unspecified  . Occlusion and stenosis of unspecified carotid artery    without mention of cerebral infarction  . Osteoarthritis   . Osteoporosis   . Other constipation   . Other vitamin B12 deficiency anemias   . Otitis media, unspecified, unspecified ear   . Ovarian failure    unspecified  . Pain in limb   . Panic disorder without agoraphobia   . Peripheral vascular disease ([place])    unspecified  . Phlebitis of left arm   .  RA (rheumatoid arthritis) ([place])   . Sigmoid polyp 1998  . Sleep disturbance    unspecified  . Stroke ([place])   . Syncope and collapse   . Thalassemia   . TIA (transient ischemic attack)    2000 and 2008 right carotid stent 08/14/2009 on Plavix  . Transient disorder of initiating or maintaining sleep   . Varicose veins of bilateral lower extremities with other complications   . Varicose veins of bilateral lower extremities with other complications   . Vision abnormalities     Past Surgical History:  Procedure Laterality Date  . BACK SURGERY    . CARDIAC CATHETERIZATION    . CAROTID ENDARTERECTOMY Left   . CATARACT EXTRACTION    . CHOLECYSTECTOMY  1980  . COLONOSCOPY  2010  . EYELID SURGERY Bilateral 2005   BUL BLEPH  . FRACTURE SURGERY    . JOINT REPLACEMENT    . KYPHOPLASTY N/A 08/12/2017   Procedure:  KYPHOPLASTY;  Surgeon: Hessie Knows, MD;  Location: [facility] ORS;  Service: Orthopedics;  Laterality: N/A;  . KYPHOPLASTY N/A 11/08/2017   Procedure: Hewitt Shorts;  Surgeon: Hessie Knows, MD;  Location: [facility] ORS;  Service: Orthopedics;  Laterality: N/A;  . LENS EYE SURGERY Left 06/16/2001   +23.5D  . LENS EYE SURGERY Right 05/28/2003   22.5D  . PERCUTANEOUS CORONARY STENT INTERVENTION (PCI-S)    . PERCUTANEOUS PLACEMENT INTRAVASCULAR STENT CERVICAL CAROTID ARTERY  06/14/2009  . ROTATOR CUFF REPAIR Left   . SUPRACERVICAL ABDOMINAL HYSTERECTOMY  1978   WITH REMOVAL TUBES &/OR OVARIES  . TOTAL KNEE ARTHROPLASTY Right     There were no vitals filed for this visit.  Subjective Assessment - 03/03/18 1433    Subjective  Patient reports she is ready for discharge from therapy and will continue with home program. She reports her back is a great deal better and she is able to do a lot more at home that prior to therapy.     Pertinent History  Patient fell at home 11/04/17 injuring back and underwent kyphoplasty L2 on 11/08/2017. She has history of chronic myelocytic leukemia, HTN, osteoarthritis, RA, osterporosis, TIAs and CVA. She has had multple falls at home when not using AD.     Limitations  Standing;Walking;Sitting    How long can you sit comfortably?  short periods of time    How long can you stand comfortably?  <10 min    How long can you walk comfortably?  short distances with rollator    Patient Stated Goals  decrease back pain and improve walking to reduce falls    Currently in Pain?  Other (Comment) mild discomfort in back and knees          Objective: gait: ambulating with rollator independently with forward flexed posture and moderate forward head posture, improved cadence palpation: mild soft tissue tenderness palpable along lumbar spine paraspinal muscles and tender along bilateral paraspinal muscles thoracic spine; no significant spasms noted Strength: bilateral UE's: shoulder  flexion, abduction, extension at least 4/5, ER/IR 4/5; LE's: bilateral LEs major muscle groups grossly tested [facility] for transfers and ambulation Outcome measures: 5x sit to stand 17 seconds: initially unable to test: 10MW test 23 seconds using Rolator, improved slightly from 25 seconds and is independent with improved balance from initial assessment   Treatment: Manual therapy: 61mn sitting in chair: STM to thoracic and lumbar spine superficial techniques for spasms and decreasing pain, bilateralparaspinal muscles  Therapeutic exercise:patient performed with instruction, demonstration, verbal and tactile cueing of  therapist: goal: independent with home program, improve ambulation, strength  Sitting: hip abduction with red resistive band x 15 reps hip flexion x 10 reps each LE  UE exercises with graded manual resistance: Scapular rows bilateral x 20 Shoulder rotations x 10 Elbow flexion/extension x 10  Walk with rolator: 100 feet with standby supervision, 10MW 23 seconds   Modalities: Moist heat applied to back during sitting exercises with no adverse effects noted (unbilled)   Patient response to treatment: patient demonstrated improved technique with exercises with minimal VC for correct alignment.       PT Education - 03/03/18 1500    Education provided  Yes    Education Details  re assessed home program    Person(s) Educated  Patient    Methods  Explanation    Comprehension  Verbalized understanding          PT Long Term Goals - 03/03/18 1449      PT LONG TERM GOAL #1   Title  Patient will complete a TUG test in <20 seconds with an AD for independent mobility and decreased fall risk     Baseline  37 seconds with rollator; 30 seconds    Status  Partially Met      PT LONG TERM GOAL #2   Title  Patient  will complete 5x/sit to stand test in < 25 seconds indicating improved strength and decreased fall risk    Baseline  5x sit to stand using hands for support  17 seconds    Status  Achieved      PT LONG TERM GOAL #3   Title  improved 10Mw test iin 15 seconds with rollator and close supervision demonstrating improvement with ambulation and decreased fall risk    Baseline  10MW 25.5 seconds (.39 m/s); 03/03/18 23 seconds    Status  Partially Met      PT LONG TERM GOAL #4   Title  Patient will be independent with home program for strength and balance activities to allow transition to self management     Baseline  requries instruction, guidance, cuing for exercises; patient demonstrates good understanding of home exercises 03/03/18    Status  Achieved            Plan - 03/03/18 1524    Clinical Impression Statement  Patient demonstrates good understanding of home program and has progressed well with physical therapy. She has partially met goals due to chronic leukemia and chronic pain. She should continue to progress with self managemen and has a caregiver  coming into the home who she states will help with her exercises. Plan discharge from physical therapy at this time.     Rehab Potential  Good    Clinical Impairments Affecting Rehab Potential  (+)motivated, acute condition(-)multiple comorbidities: arthritis, falls, decreased balance, history of TIAs, CVA    PT Frequency  2x / week    PT Duration  6 weeks    PT Treatment/Interventions  Balance training;Therapeutic exercise;Therapeutic activities;Patient/family education;Manual techniques;Cryotherapy;Electrical Stimulation    PT Next Visit Plan  discharge from physical therapy    PT Home Exercise Plan  hip adduction with ball and glute sets, SLR sitting, roll ball underfoot; resistive band exercises for UE's and LE's    Consulted and Agree with Plan of Care  Patient       Patient will benefit from skilled therapeutic intervention in order to improve the following deficits and impairments:  Pain, Decreased mobility, Impaired perceived functional ability, Decreased strength, Decreased  range of  motion, Decreased endurance, Decreased activity tolerance, Decreased balance, Decreased safety awareness, Difficulty walking, Increased muscle spasms  Visit Diagnosis: Difficulty in walking, not elsewhere classified  Muscle weakness (generalized)  Repeated falls     Problem List Patient Active Problem List   Diagnosis Date Noted  . Protein-calorie malnutrition, severe 11/08/2017  . Lumbar compression fracture ([place]) 11/05/2017  . Closed fracture of proximal end of left humerus with routine healing 08/28/2016  . Acute cystitis 04/28/2016  . Chest pain 03/14/2015  . TIA (transient ischemic attack) 01/29/2015  . Allergic arthritis, hand     Jomarie Longs PT 03/04/2018, 2:29 PM  [facility] [address], [place], [address] Phone: [phone]   Fax:  [phone]  Name: Stephanie Harris MRN: [id number] Date of Birth: 08-24-1931

## 2018-03-07 ENCOUNTER — Ambulatory Visit: Payer: Medicare Other | Admitting: Physical Therapy

## 2018-03-10 ENCOUNTER — Ambulatory Visit: Payer: Medicare Other | Admitting: Physical Therapy

## 2018-03-10 DIAGNOSIS — N183 Chronic kidney disease, stage 3 (moderate): Secondary | ICD-10-CM | POA: Diagnosis not present

## 2018-03-10 DIAGNOSIS — D72820 Lymphocytosis (symptomatic): Secondary | ICD-10-CM | POA: Diagnosis not present

## 2018-03-10 DIAGNOSIS — D631 Anemia in chronic kidney disease: Secondary | ICD-10-CM | POA: Diagnosis not present

## 2018-03-14 ENCOUNTER — Ambulatory Visit: Payer: Medicare Other | Admitting: Physical Therapy

## 2018-03-17 ENCOUNTER — Encounter: Payer: Medicare Other | Admitting: Physical Therapy

## 2018-03-18 ENCOUNTER — Other Ambulatory Visit: Payer: Self-pay

## 2018-03-18 ENCOUNTER — Emergency Department (HOSPITAL_COMMUNITY): Payer: Medicare Other

## 2018-03-18 ENCOUNTER — Inpatient Hospital Stay (HOSPITAL_COMMUNITY)
Admission: EM | Admit: 2018-03-18 | Discharge: 2018-03-22 | DRG: 103 | Disposition: A | Discharge status: Home / Self Care | Payer: Medicare Other | Attending: Family Medicine | Admitting: Family Medicine

## 2018-03-18 ENCOUNTER — Encounter (HOSPITAL_COMMUNITY): Payer: Self-pay | Admitting: Emergency Medicine

## 2018-03-18 DIAGNOSIS — R29810 Facial weakness: Secondary | ICD-10-CM | POA: Diagnosis present

## 2018-03-18 DIAGNOSIS — C9210 Chronic myeloid leukemia, BCR/ABL-positive, not having achieved remission: Secondary | ICD-10-CM | POA: Diagnosis present

## 2018-03-18 DIAGNOSIS — Z8261 Family history of arthritis: Secondary | ICD-10-CM

## 2018-03-18 DIAGNOSIS — I161 Hypertensive emergency: Secondary | ICD-10-CM | POA: Diagnosis not present

## 2018-03-18 DIAGNOSIS — Z961 Presence of intraocular lens: Secondary | ICD-10-CM | POA: Diagnosis present

## 2018-03-18 DIAGNOSIS — Z806 Family history of leukemia: Secondary | ICD-10-CM

## 2018-03-18 DIAGNOSIS — Z9049 Acquired absence of other specified parts of digestive tract: Secondary | ICD-10-CM

## 2018-03-18 DIAGNOSIS — E039 Hypothyroidism, unspecified: Secondary | ICD-10-CM | POA: Diagnosis present

## 2018-03-18 DIAGNOSIS — M1990 Unspecified osteoarthritis, unspecified site: Secondary | ICD-10-CM | POA: Diagnosis present

## 2018-03-18 DIAGNOSIS — Z885 Allergy status to narcotic agent status: Secondary | ICD-10-CM

## 2018-03-18 DIAGNOSIS — T7431XA Adult psychological abuse, confirmed, initial encounter: Secondary | ICD-10-CM | POA: Diagnosis present

## 2018-03-18 DIAGNOSIS — Z8249 Family history of ischemic heart disease and other diseases of the circulatory system: Secondary | ICD-10-CM

## 2018-03-18 DIAGNOSIS — M810 Age-related osteoporosis without current pathological fracture: Secondary | ICD-10-CM | POA: Diagnosis present

## 2018-03-18 DIAGNOSIS — Z7982 Long term (current) use of aspirin: Secondary | ICD-10-CM

## 2018-03-18 DIAGNOSIS — I2510 Atherosclerotic heart disease of native coronary artery without angina pectoris: Secondary | ICD-10-CM | POA: Diagnosis present

## 2018-03-18 DIAGNOSIS — I6503 Occlusion and stenosis of bilateral vertebral arteries: Secondary | ICD-10-CM | POA: Diagnosis not present

## 2018-03-18 DIAGNOSIS — S32029D Unspecified fracture of second lumbar vertebra, subsequent encounter for fracture with routine healing: Secondary | ICD-10-CM

## 2018-03-18 DIAGNOSIS — G43109 Migraine with aura, not intractable, without status migrainosus: Principal | ICD-10-CM | POA: Diagnosis present

## 2018-03-18 DIAGNOSIS — Z9849 Cataract extraction status, unspecified eye: Secondary | ICD-10-CM

## 2018-03-18 DIAGNOSIS — Z79890 Hormone replacement therapy (postmenopausal): Secondary | ICD-10-CM

## 2018-03-18 DIAGNOSIS — E1151 Type 2 diabetes mellitus with diabetic peripheral angiopathy without gangrene: Secondary | ICD-10-CM | POA: Diagnosis present

## 2018-03-18 DIAGNOSIS — Z955 Presence of coronary angioplasty implant and graft: Secondary | ICD-10-CM

## 2018-03-18 DIAGNOSIS — Z91040 Latex allergy status: Secondary | ICD-10-CM

## 2018-03-18 DIAGNOSIS — E1122 Type 2 diabetes mellitus with diabetic chronic kidney disease: Secondary | ICD-10-CM | POA: Diagnosis present

## 2018-03-18 DIAGNOSIS — D563 Thalassemia minor: Secondary | ICD-10-CM | POA: Diagnosis present

## 2018-03-18 DIAGNOSIS — R200 Anesthesia of skin: Secondary | ICD-10-CM | POA: Diagnosis present

## 2018-03-18 DIAGNOSIS — G8929 Other chronic pain: Secondary | ICD-10-CM | POA: Diagnosis present

## 2018-03-18 DIAGNOSIS — I6529 Occlusion and stenosis of unspecified carotid artery: Secondary | ICD-10-CM

## 2018-03-18 DIAGNOSIS — N183 Chronic kidney disease, stage 3 (moderate): Secondary | ICD-10-CM | POA: Diagnosis present

## 2018-03-18 DIAGNOSIS — W19XXXD Unspecified fall, subsequent encounter: Secondary | ICD-10-CM | POA: Diagnosis present

## 2018-03-18 DIAGNOSIS — G459 Transient cerebral ischemic attack, unspecified: Secondary | ICD-10-CM | POA: Diagnosis not present

## 2018-03-18 DIAGNOSIS — I252 Old myocardial infarction: Secondary | ICD-10-CM | POA: Diagnosis not present

## 2018-03-18 DIAGNOSIS — Z823 Family history of stroke: Secondary | ICD-10-CM

## 2018-03-18 DIAGNOSIS — R471 Dysarthria and anarthria: Secondary | ICD-10-CM | POA: Diagnosis present

## 2018-03-18 DIAGNOSIS — Z9181 History of falling: Secondary | ICD-10-CM

## 2018-03-18 DIAGNOSIS — Z79899 Other long term (current) drug therapy: Secondary | ICD-10-CM

## 2018-03-18 DIAGNOSIS — I129 Hypertensive chronic kidney disease with stage 1 through stage 4 chronic kidney disease, or unspecified chronic kidney disease: Secondary | ICD-10-CM | POA: Diagnosis present

## 2018-03-18 DIAGNOSIS — R51 Headache: Secondary | ICD-10-CM | POA: Diagnosis not present

## 2018-03-18 DIAGNOSIS — R4182 Altered mental status, unspecified: Secondary | ICD-10-CM | POA: Diagnosis not present

## 2018-03-18 DIAGNOSIS — R4701 Aphasia: Secondary | ICD-10-CM | POA: Diagnosis present

## 2018-03-18 DIAGNOSIS — M069 Rheumatoid arthritis, unspecified: Secondary | ICD-10-CM | POA: Diagnosis present

## 2018-03-18 DIAGNOSIS — Z87440 Personal history of urinary (tract) infections: Secondary | ICD-10-CM

## 2018-03-18 DIAGNOSIS — Z888 Allergy status to other drugs, medicaments and biological substances status: Secondary | ICD-10-CM

## 2018-03-18 DIAGNOSIS — Z880 Allergy status to penicillin: Secondary | ICD-10-CM

## 2018-03-18 DIAGNOSIS — I63239 Cerebral infarction due to unspecified occlusion or stenosis of unspecified carotid arteries: Secondary | ICD-10-CM | POA: Diagnosis not present

## 2018-03-18 DIAGNOSIS — Z8673 Personal history of transient ischemic attack (TIA), and cerebral infarction without residual deficits: Secondary | ICD-10-CM

## 2018-03-18 DIAGNOSIS — Z886 Allergy status to analgesic agent status: Secondary | ICD-10-CM

## 2018-03-18 DIAGNOSIS — Z96651 Presence of right artificial knee joint: Secondary | ICD-10-CM | POA: Diagnosis present

## 2018-03-18 HISTORY — DX: Headache, unspecified: R51.9

## 2018-03-18 HISTORY — DX: Adverse effect of unspecified anesthetic, initial encounter: T41.45XA

## 2018-03-18 HISTORY — DX: Other chronic pain: G89.29

## 2018-03-18 HISTORY — DX: Family history of other specified conditions: Z84.89

## 2018-03-18 HISTORY — DX: Other complications of anesthesia, initial encounter: T88.59XA

## 2018-03-18 HISTORY — DX: Low back pain: M54.5

## 2018-03-18 HISTORY — DX: Unspecified osteoarthritis, unspecified site: M19.90

## 2018-03-18 HISTORY — DX: Type 2 diabetes mellitus without complications: E11.9

## 2018-03-18 HISTORY — DX: Headache: R51

## 2018-03-18 HISTORY — DX: Transient cerebral ischemic attack, unspecified: G45.9

## 2018-03-18 HISTORY — DX: Personal history of other medical treatment: Z92.89

## 2018-03-18 HISTORY — DX: Low back pain, unspecified: M54.50

## 2018-03-18 HISTORY — DX: Acute myocardial infarction, unspecified: I21.9

## 2018-03-18 LAB — COMPREHENSIVE METABOLIC PANEL
ALT: 13 U/L (ref 0–44)
AST: 19 U/L (ref 15–41)
Albumin: 3.7 g/dL (ref 3.5–5.0)
Alkaline Phosphatase: 27 U/L — ABNORMAL LOW (ref 38–126)
Anion gap: 12 (ref 5–15)
BUN: 17 mg/dL (ref 8–23)
CO2: 20 mmol/L — ABNORMAL LOW (ref 22–32)
Calcium: 9.7 mg/dL (ref 8.9–10.3)
Chloride: 108 mmol/L (ref 98–111)
Creatinine, Ser: 1.02 mg/dL — ABNORMAL HIGH (ref 0.44–1.00)
GFR calc Af Amer: 56 mL/min — ABNORMAL LOW (ref >60)
GFR calc non Af Amer: 49 mL/min — ABNORMAL LOW (ref >60)
Glucose, Bld: 105 mg/dL — ABNORMAL HIGH (ref 70–99)
Potassium: 3.8 mmol/L (ref 3.5–5.1)
Sodium: 140 mmol/L (ref 135–145)
Total Bilirubin: 2.2 mg/dL — ABNORMAL HIGH (ref 0.3–1.2)
Total Protein: 6.9 g/dL (ref 6.5–8.1)

## 2018-03-18 LAB — I-STAT CHEM 8, ED
BUN: 17 mg/dL (ref 8–23)
Calcium, Ion: 1.2 mmol/L (ref 1.15–1.40)
Chloride: 106 mmol/L (ref 98–111)
Creatinine, Ser: 1 mg/dL (ref 0.44–1.00)
Glucose, Bld: 103 mg/dL — ABNORMAL HIGH (ref 70–99)
HCT: 34 % — ABNORMAL LOW (ref 36.0–46.0)
Hemoglobin: 11.6 g/dL — ABNORMAL LOW (ref 12.0–15.0)
Potassium: 3.8 mmol/L (ref 3.5–5.1)
Sodium: 140 mmol/L (ref 135–145)
TCO2: 23 mmol/L (ref 22–32)

## 2018-03-18 LAB — DIFFERENTIAL
Basophils Absolute: 0 10*3/uL (ref 0.0–0.1)
Basophils Relative: 0 %
Eosinophils Absolute: 0.1 10*3/uL (ref 0.0–0.7)
Eosinophils Relative: 2 %
Lymphocytes Relative: 50 %
Lymphs Abs: 3.6 10*3/uL (ref 0.7–4.0)
Monocytes Absolute: 1.1 10*3/uL — ABNORMAL HIGH (ref 0.1–1.0)
Monocytes Relative: 15 %
Neutro Abs: 2.4 10*3/uL (ref 1.7–7.7)
Neutrophils Relative %: 33 %

## 2018-03-18 LAB — CBC
HCT: 32.5 % — ABNORMAL LOW (ref 36.0–46.0)
Hemoglobin: 9.8 g/dL — ABNORMAL LOW (ref 12.0–15.0)
MCH: 19.2 pg — ABNORMAL LOW (ref 26.0–34.0)
MCHC: 30.2 g/dL (ref 30.0–36.0)
MCV: 63.6 fL — ABNORMAL LOW (ref 78.0–100.0)
Platelets: 140 10*3/uL — ABNORMAL LOW (ref 150–400)
RBC: 5.11 MIL/uL (ref 3.87–5.11)
RDW: 18.2 % — ABNORMAL HIGH (ref 11.5–15.5)
WBC: 7.2 10*3/uL (ref 4.0–10.5)

## 2018-03-18 LAB — APTT: aPTT: 29 seconds (ref 24–36)

## 2018-03-18 LAB — I-STAT TROPONIN, ED: Troponin i, poc: 0.01 ng/mL (ref 0.00–0.08)

## 2018-03-18 LAB — PROTIME-INR
INR: 1.07
Prothrombin Time: 13.9 seconds (ref 11.4–15.2)

## 2018-03-18 LAB — TROPONIN I: Troponin I: <0.03 ng/mL (ref <0.03)

## 2018-03-18 MED ORDER — DOCUSATE SODIUM 100 MG PO CAPS: 100.0000 mg | ORAL_CAPSULE | Freq: Every day | ORAL | Status: DC | PRN

## 2018-03-18 MED ORDER — ONDANSETRON HCL 4 MG/2ML IJ SOLN
4.0000 mg | Freq: Once | INTRAMUSCULAR | Status: AC
  Administered 2018-03-18: 4 mg via INTRAVENOUS
  Filled 2018-03-18: qty 2

## 2018-03-18 MED ORDER — ONDANSETRON HCL 4 MG PO TABS
4.0000 mg | ORAL_TABLET | Freq: Three times a day (TID) | ORAL | Status: DC | PRN
  Administered 2018-03-21: 4 mg via ORAL
  Filled 2018-03-18: qty 1

## 2018-03-18 MED ORDER — LEVOTHYROXINE SODIUM 50 MCG PO TABS
50.0000 ug | ORAL_TABLET | Freq: Every day | ORAL | Status: DC
Start: 2018-03-19 — End: 2018-03-22
  Administered 2018-03-19 – 2018-03-22 (×4): 50 ug via ORAL
  Filled 2018-03-18 (×4): qty 1

## 2018-03-18 MED ORDER — POLYETHYLENE GLYCOL 3350 17 G PO PACK: 17.0000 g | PACK | Freq: Every day | ORAL | Status: DC | PRN

## 2018-03-18 MED ORDER — ASPIRIN EC 81 MG PO TBEC
81.0000 mg | DELAYED_RELEASE_TABLET | Freq: Every day | ORAL | Status: DC
  Administered 2018-03-19 – 2018-03-22 (×4): 81 mg via ORAL
  Filled 2018-03-18 (×4): qty 1

## 2018-03-18 MED ORDER — LABETALOL HCL 5 MG/ML IV SOLN
10.0000 mg | Freq: Once | INTRAVENOUS | Status: AC
  Administered 2018-03-18: 10 mg via INTRAVENOUS

## 2018-03-18 MED ORDER — ENOXAPARIN SODIUM 40 MG/0.4ML ~~LOC~~ SOLN
40.0000 mg | SUBCUTANEOUS | Status: DC
  Administered 2018-03-18: 40 mg via SUBCUTANEOUS
  Filled 2018-03-18: qty 0.4

## 2018-03-18 MED ORDER — ACETAMINOPHEN 325 MG PO TABS
650.0000 mg | ORAL_TABLET | Freq: Four times a day (QID) | ORAL | Status: DC | PRN
  Administered 2018-03-18 – 2018-03-22 (×5): 650 mg via ORAL
  Filled 2018-03-18 (×5): qty 2

## 2018-03-18 MED ORDER — CARVEDILOL 6.25 MG PO TABS: 6.2500 mg | ORAL_TABLET | Freq: Two times a day (BID) | ORAL | Status: DC

## 2018-03-18 MED ORDER — LABETALOL HCL 5 MG/ML IV SOLN: 5.0000 mg | INTRAVENOUS | Status: DC | PRN

## 2018-03-18 MED ORDER — ACETAMINOPHEN 650 MG RE SUPP: 650.0000 mg | Freq: Four times a day (QID) | RECTAL | Status: DC | PRN

## 2018-03-18 NOTE — ED Notes (Signed)
Pt given sandwich and sprite, OK per admitting team at bedside

## 2018-03-18 NOTE — Consult Note (Signed)
NEURO HOSPITALIST      Requesting Physician: Dr. Melina Copa    Chief Complaint: right facial droop, HA, aphasia  History obtained from:  Patient / chart   HPI:                                                                                                                                         Stephanie Harris is an 84 y.o. female with PMH significant for HTN, hemorrhagic stroke July  2018 with no residual deficits,HLD, DM2, CML, and CAD who presents to [facility] as a code stroke for right facial droop, aphasia and HA.  Patient was at home where she noticed a  A sudden onset of HA with pain radiating from left to right side of head. Right  facial droop and word finding difficulty. Patient states that she lives with her husband and walks with a walker at baseline. States that "she has cancer and has a sten"t. Patient unable to give much of her history d/t her word finding difficulty when asked questions. Per chart patient no on any anticoagulation. Prescribed ASA 81 mg daily. Patient states she takes ASA daily and has not missed any doses.  ED course:  BP on EMS arrival to scene was 200/75 and BG 114.10 mg of labetalol given in ED to keep SBP 160-180. Hgb 11.6 and HCT 34.0, platelets 140 CT head: no acute intracranial abnormality. Extensive chronic small vessel ischemic disease with small chronic infarcts.   Past strokes:  February 2016 July 2018 hemorrhagic stroke ( 8ml) in the left basal ganglia ( left putamen and posterior limb of internal capsule) with no residual deficits.   Date last known well: Date: 03/18/2018 Time last known well: Time: 09:30 tPA Given: No:  Hx of ICH Modified Rankin: Rankin Score=2  NIHSS:2 1a Level of Conscious:0 1b LOC Questions: 0 1c LOC Commands: 0 2 Best Gaze: 0 3 Visual: 0 4 Facial Palsy: 1 5a Motor Arm - left: 0 5b Motor Arm - Right: 0 6a Motor Leg - Left: 0 6b Motor Leg - Right: 0 7 Limb Ataxia: 0 8  Sensory: 0 9 Best Language: 1 10 Dysarthria:0 11 Extinct. and Inattention:0 TOTAL: 2   Past Medical History:  Diagnosis Date  . Acute bronchiolitis   . Acute pharyngitis   . Allergic rhinitis due to pollen   . Anemia, unspecified    CKD and LGL  . Anxiety state    unspecified  . Asthma without status asthmaticus    unspecified  . B12 deficiency   . Beta thalassemia trait   . Carotid artery occlusion   . Cellulitis and  abscess of leg, except foot   . Cervical spondylosis   . Cervicalgia   . Chronic kidney disease   . CML (chronic myelocytic leukemia) ([place])    Onc at [facility]  . Coronary artery disease 2009   heart attack with stent  . Coronary atherosclerosis of native coronary artery   . Depressive disorder    not elsewhere classified  . Diabetes mellitus type 2, uncomplicated ([place])   . Difficulty in walking   . Disorder of breast, unspecified   . Dizziness and giddiness   . Dysuria   . Esophageal reflux   . Essential hypertension    unspecified  . Head injury    unspecified  . Heart disease   . Hematuria, unspecified   . Hyperlipidemia, unspecified   . Hypersomnia, unspecified   . Hypertension   . Hypopotassemia   . Hypothyroidism    unspecified  . ICH (intracerebral hemorrhage) ([place])   . Ill-defined cerebrovascular disease    other  . Insomnia    unspecified  . 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 Leukemia ([place])   . Lower urinary tract infection   . Lumbago   . Mini stroke ([place])    x years  . Mixed hyperlipidemia   . Nontoxic nodular goiter    unspecified  . Occlusion and stenosis of unspecified carotid artery    without mention of cerebral infarction  . Osteoarthritis   . Osteoporosis   . Other constipation   . Other vitamin B12 deficiency anemias   . Otitis media, unspecified, unspecified ear   . Ovarian failure    unspecified  . Pain in limb   . Panic disorder without agoraphobia   . Peripheral vascular disease ([place])     unspecified  . Phlebitis of left arm   . RA (rheumatoid arthritis) ([place])   . Sigmoid polyp 1998  . Sleep disturbance    unspecified  . Stroke ([place])   . Syncope and collapse   . Thalassemia   . TIA (transient ischemic attack)    2000 and 2008 right carotid stent 08/14/2009 on Plavix  . Transient disorder of initiating or maintaining sleep   . Varicose veins of bilateral lower extremities with other complications   . Varicose veins of bilateral lower extremities with other complications   . Vision abnormalities     Past Surgical History:  Procedure Laterality Date  . BACK SURGERY    . CARDIAC CATHETERIZATION    . CAROTID ENDARTERECTOMY Left   . CATARACT EXTRACTION    . CHOLECYSTECTOMY  1980  . COLONOSCOPY  2010  . EYELID SURGERY Bilateral 2005   BUL BLEPH  . FRACTURE SURGERY    . JOINT REPLACEMENT    . KYPHOPLASTY N/A 08/12/2017   Procedure: KYPHOPLASTY;  Surgeon: Hessie Knows, MD;  Location: [facility] ORS;  Service: Orthopedics;  Laterality: N/A;  . KYPHOPLASTY N/A 11/08/2017   Procedure: Hewitt Shorts;  Surgeon: Hessie Knows, MD;  Location: [facility] ORS;  Service: Orthopedics;  Laterality: N/A;  . LENS EYE SURGERY Left 06/16/2001   +23.5D  . LENS EYE SURGERY Right 05/28/2003   22.5D  . PERCUTANEOUS CORONARY STENT INTERVENTION (PCI-S)    . PERCUTANEOUS PLACEMENT INTRAVASCULAR STENT CERVICAL CAROTID ARTERY  06/14/2009  . ROTATOR CUFF REPAIR Left   . SUPRACERVICAL ABDOMINAL HYSTERECTOMY  1978   WITH REMOVAL TUBES &/OR OVARIES  . TOTAL KNEE ARTHROPLASTY Right     Family History  Problem Relation Age of Onset  . Acute myelogenous leukemia Brother   .  Anemia Brother   . Coronary artery disease Brother   . Heart disease Brother   . Stroke Brother   . Heart failure Brother   . Stroke Mother   . Anemia Mother   . Heart disease Mother   . Heart failure Mother   . Hypertension Mother   . Osteoarthritis Mother   . Rheum arthritis Mother   . Heart attack Father   . Anemia  Sister   . Cataracts Sister   . Osteoarthritis Sister   . Rheum arthritis Sister   . Stroke Sister   . Heart disease Sister   . Thalassemia Sister   . Thalassemia Sister   . Blindness Neg Hx   . Glaucoma Neg Hx   . Macular degeneration Neg Hx   . Strabismus Neg Hx   . Vision loss Neg Hx   . Basal cell carcinoma Neg Hx   . GU problems Neg Hx   . Kidney cancer Neg Hx   . Melanoma Neg Hx   . Kidney disease Neg Hx   . Prostate cancer Neg Hx   . Squamous cell carcinoma Neg Hx          Social History:  reports that she has never smoked. She has never used smokeless tobacco. She reports that she does not drink alcohol or use drugs.  Allergies:  Allergies  Allergen Reactions  . Latex Rash  . Meperidine     Other reaction(s): Other (See Comments) Other Reaction: CNS Disorder  . Penicillins Other (See Comments)    Has patient had a PCN reaction causing immediate rash, facial/tongue/throat swelling, SOB or lightheadedness with hypotension: Unknown Has patient had a PCN reaction causing severe rash involving mucus membranes or skin necrosis: Unknown Has patient had a PCN reaction that required hospitalization: Unknown Has patient had a PCN reaction occurring within the last 10 years: Unknown If all of the above answers are "NO", then may proceed with Cephalosporin use.   . Cefuroxime Axetil Nausea And Vomiting  . Codeine Nausea And Vomiting and Nausea Only  . Propoxyphene Nausea Only    Other reaction(s): Vomiting    Medications:                                                                                                                           No current facility-administered medications for this encounter.    Current Outpatient Medications  Medication Sig Dispense Refill  . amLODipine (NORVASC) 5 MG tablet Take 1 tablet (5 mg total) by mouth daily. 30 tablet 0  . ascorbic acid (VITAMIN C) 1000 MG tablet Take 1,000 mg by mouth daily.    Marland Kitchen aspirin 81 MG EC tablet Take 81  mg by mouth daily.     . carvedilol (COREG) 3.125 MG tablet Take 6.25 mg by mouth 2 (two) times daily.     . cholecalciferol (VITAMIN D) 1000 UNITS tablet Take 1,000 Units by mouth daily.    . cyanocobalamin (,VITAMIN  B-12,) 1000 MCG/ML injection Inject 1,000 mcg into the muscle every 30 (thirty) days. At the 1st of the month - GIVING EARLY AT PT REQUEST- PT MISSED THE INJECTION LAST MONTH    . docusate sodium (COLACE) 100 MG capsule Take 100 mg by mouth daily as needed.    Marland Kitchen levothyroxine (SYNTHROID, LEVOTHROID) 50 MCG tablet Take 50 mcg by mouth daily.     . ondansetron (ZOFRAN) 4 MG tablet Take 4 mg by mouth every 8 (eight) hours as needed for nausea or vomiting.    Marland Kitchen oxyCODONE-acetaminophen (PERCOCET/ROXICET) 5-325 MG tablet Take 1 tablet by mouth every 6 (six) hours as needed for severe pain. (Patient not taking: Reported on 12/01/2017) 120 tablet 0  . polyethylene glycol (MIRALAX / GLYCOLAX) packet Take 17 g by mouth daily as needed.    . vitamin E 400 UNIT capsule Take 400 Units by mouth daily.       ROS:                                                                                                                                       History obtained from the patient  General ROS: negative for - chills, fatigue, fever, night sweats, weight gain or weight loss  Ophthalmic ROS: negative for - blurry vision, double vision, eye pain or loss of vision Respiratory ROS: negative for - cough,  shortness of breath or wheezing Cardiovascular ROS: negative for - chest pain, dyspnea on exertion,  Musculoskeletal ROS: positive for chronic back pain negative for - joint swelling or muscular weakness Neurological ROS: as noted in HPI   General Examination:                                                                                                      Blood pressure (!) 199/82.  HEENT-  Normocephalic, no lesions, without obvious abnormality.  Normal external eye and conjunctiva.   Cardiovascular-, pulses palpable throughout   Lungs-no excessive working breathing.  Saturations within normal limits on RA Extremities- Warm, dry and intact Musculoskeletal-no joint tenderness, deformity or swelling Skin-warm and dry, intact  Neurological Examination Mental Status: Alert, oriented to person, age. Able to name a pin, and a finger  Speech is very slow, but patient able to answer questions if given awhile to answer.  Able to follow simple commands. Patient frustrated and does lack some effort. Cranial Nerves: II:  Visual fields grossly normal, blinks to threat.  III,IV, VI: ptosis not present, extra-ocular motions  intact bilaterally, pupils equal, round, reactive to light and accommodation V,VII: smile symmetric, facial light touch sensation normal bilaterally VIII: hearing normal bilaterally IX,X: uvula rises symmetrically XI: bilateral shoulder shrug XII: midline tongue extension Motor: Right : Upper extremity   4/5    Left:     Upper extremity   4/5  Lower extremity   4/5     Lower extremity  45/5 Tone and bulk:normal tone throughout; no atrophy noted Sensory: light touch intact throughout, bilaterally Deep Tendon Reflexes: 2+ and symmetric throughout Plantars: Right: downgoing   Left: downgoing Cerebellar: Slow finger-to-nose, deferred HTS d/t back pain Gait: deferred   Lab Results: Basic Metabolic Panel: Recent Labs  Lab 03/18/18 1131  NA 140  K 3.8  CL 106  GLUCOSE 103*  BUN 17  CREATININE 1.00    CBC: No results for input(s): WBC, NEUTROABS, HGB, HCT, MCV, PLT in the last 168 hours.  Lipid Panel: No results for input(s): CHOL, TRIG, HDL, CHOLHDL, VLDL, LDLCALC in the last 168 hours.  CBG: No results for input(s): GLUCAP in the last 168 hours.  Imaging: Ct Head Code Stroke Wo Contrast  Result Date: 03/18/2018 CLINICAL DATA:  Code stroke.  Aphasia.  Right facial droop. EXAM: CT HEAD WITHOUT CONTRAST TECHNIQUE: Contiguous axial images were  obtained from the base of the skull through the vertex without intravenous contrast. COMPARISON:  11/04/2017 FINDINGS: Brain: There is no evidence of acute large territory infarct, intracranial hemorrhage, mass, midline shift, or extra-axial fluid collection. Numerous small chronic infarcts are again seen involving the cerebellum bilaterally, basal ganglia, cerebral white matter, and right frontal cortex. Confluent hypoattenuation throughout the cerebral white matter is similar to the prior study and nonspecific but compatible with extensive chronic small vessel ischemic disease. Mild cerebral atrophy is unchanged. Vascular: Calcified atherosclerosis at the skull base. No hyperdense vessel. Skull: No fracture or focal osseous lesion. Hyperostosis frontalis interna. Sinuses/Orbits: Paranasal sinuses and mastoid air cells are clear. Bilateral cataract extraction. Other: None. ASPECTS [facility] Stroke Program Early CT Score) - Ganglionic level infarction (caudate, lentiform nuclei, internal capsule, insula, M1-M3 cortex): 7 - Supraganglionic infarction (M4-M6 cortex): 3 Total score (0-10 with 10 being normal): 10 IMPRESSION: 1. No evidence of acute intracranial abnormality. 2. ASPECTS is 10. 3. Extensive chronic small vessel ischemic disease with numerous small chronic infarcts. These results were communicated to Dr. Lorraine Lax at 11:32 am on 03/18/2018 by text page via the [facility] messaging system. Electronically Signed   By: Logan Bores M.D.   On: 03/18/2018 11:33       Laurey Morale, MSN, NP-C [facility] [phone]  03/18/2018, 11:34 AM   Attending physician note to follow with Assessment and plan .   Assessment: Stephanie Harris is an 84 y.o. female with PMH significant for HTN, hemorrhagic stroke July  2018 with no residual deficits,HLD, DM2, CML, and CAD who presents to [facility] as a code stroke for right facial droop, aphasia and HA. Given that her symptoms mostly resolved by arrival to ED. Most  likel symptoms were caused by her elevated blood pressure.   Hypertensive urgency-  blood pressure control  TIA- transient symptoms- further stroke work-up    Stroke Risk Factors - hyperlipidemia, hypertension, smoking and age    Recommend -- BP goal : Maintain SBP 160-180 --MRI Brain  --MRA of the head w/o and neck with contrast --Echocardiogram --continue home dose of ASA 81 mg ( 03/19/18)  -- High intensity Statin if LDL > 70 -- HgbA1c, fasting lipid  panel -- PT consult, OT consult, Speech consult --Telemetry monitoring --Frequent neuro checks --Stroke swallow screen      --please page stroke NP  Or  PA  Or MD from 8am -4 pm  as this patient from this time will be  followed by the stroke.   You can look them up on [email or web address]  Password [email or web address]   NEUROHOSPITALIST ADDENDUM Seen and examined the patient today. I have reviewed the contents of history and physical exam as documented by PA/ARNP/Resident and agree with above documentation.  I have discussed and formulated the above plan as documented. Edits to the note have been made as needed.    Karena Addison Aroor MD [facility] [phone]   If 7pm to 7am, please call on call as listed on AMION.

## 2018-03-18 NOTE — ED Triage Notes (Signed)
Per EMS- pt arriving from home where she noticed sudden onset headache left to right starting at 0930 with a facial droop and aphasia.

## 2018-03-18 NOTE — ED Notes (Signed)
Pt c/o nausea. MD informed.  

## 2018-03-18 NOTE — ED Notes (Signed)
Patient returned from MRI.

## 2018-03-18 NOTE — Code Documentation (Signed)
84yo female arriving to [facility] via [place] at [phone]. Patient with h/o ICH in July 2018 and multiple TIAs per EMS. Today patient was at home when she experienced headache, right facial droop and aphasia at 0930. EMS called and activated a code stroke. Stroke team at the bedside on patient arrival. Labs drawn and patient cleared for CT by Dr. Sabra Heck. Patient to CT with team. CT completed. Patient hypertensive and treated with Labetalol. NIHSS 2, see documentation for details and code stroke times. Patient with mild right facial droop and difficulty answering questions. Patient is contraindicated for tPA d/t h/o ICH. MRI ordered. Bedside handoff with ED RN Traypaniel.

## 2018-03-18 NOTE — Progress Notes (Signed)
Patient arrived to unit
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
## 2018-03-18 NOTE — ED Triage Notes (Signed)
Pt arrived via [facility]; per EMS family called EMS for pt c/o HA; upon arrival EMS noted some aphasia and facial droop; pt not on any anticoags but has several TIAs in past; Pt had hemorrhagic CVA last July; CBG 122; 192/85; 20 LAC
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
## 2018-03-19 ENCOUNTER — Encounter (HOSPITAL_COMMUNITY): Payer: Self-pay | Admitting: General Practice

## 2018-03-19 ENCOUNTER — Inpatient Hospital Stay (HOSPITAL_COMMUNITY): Payer: Medicare Other

## 2018-03-19 DIAGNOSIS — G459 Transient cerebral ischemic attack, unspecified: Secondary | ICD-10-CM

## 2018-03-19 LAB — CBC
HCT: 28.6 % — ABNORMAL LOW (ref 36.0–46.0)
Hemoglobin: 8.6 g/dL — ABNORMAL LOW (ref 12.0–15.0)
MCH: 18.9 pg — ABNORMAL LOW (ref 26.0–34.0)
MCHC: 30.1 g/dL (ref 30.0–36.0)
MCV: 63 fL — ABNORMAL LOW (ref 78.0–100.0)
Platelets: 122 10*3/uL — ABNORMAL LOW (ref 150–400)
RBC: 4.54 MIL/uL (ref 3.87–5.11)
RDW: 17.7 % — ABNORMAL HIGH (ref 11.5–15.5)
WBC: 6.8 10*3/uL (ref 4.0–10.5)

## 2018-03-19 LAB — ECHOCARDIOGRAM COMPLETE
Height: 60 in
Weight: 1640.22 oz

## 2018-03-19 LAB — LIPID PANEL
Cholesterol: 127 mg/dL (ref 0–200)
HDL: 46 mg/dL (ref >40)
LDL Cholesterol: 71 mg/dL (ref 0–99)
Total CHOL/HDL Ratio: 2.8 RATIO
Triglycerides: 52 mg/dL (ref <150)
VLDL: 10 mg/dL (ref 0–40)

## 2018-03-19 LAB — BASIC METABOLIC PANEL
Anion gap: 10 (ref 5–15)
BUN: 20 mg/dL (ref 8–23)
CO2: 26 mmol/L (ref 22–32)
Calcium: 9.1 mg/dL (ref 8.9–10.3)
Chloride: 105 mmol/L (ref 98–111)
Creatinine, Ser: 1.29 mg/dL — ABNORMAL HIGH (ref 0.44–1.00)
GFR calc Af Amer: 43 mL/min — ABNORMAL LOW (ref >60)
GFR calc non Af Amer: 37 mL/min — ABNORMAL LOW (ref >60)
Glucose, Bld: 92 mg/dL (ref 70–99)
Potassium: 3.8 mmol/L (ref 3.5–5.1)
Sodium: 141 mmol/L (ref 135–145)

## 2018-03-19 LAB — TSH: TSH: 1.121 u[IU]/mL (ref 0.350–4.500)

## 2018-03-19 LAB — HEMOGLOBIN A1C
Hgb A1c MFr Bld: 5 % (ref 4.8–5.6)
Mean Plasma Glucose: 96.8 mg/dL

## 2018-03-19 MED ORDER — ENOXAPARIN SODIUM 30 MG/0.3ML ~~LOC~~ SOLN
30.0000 mg | SUBCUTANEOUS | Status: DC
  Administered 2018-03-19 – 2018-03-21 (×3): 30 mg via SUBCUTANEOUS
  Filled 2018-03-19 (×3): qty 0.3

## 2018-03-19 MED ORDER — ATORVASTATIN CALCIUM 40 MG PO TABS
40.0000 mg | ORAL_TABLET | Freq: Every day | ORAL | Status: DC
  Administered 2018-03-19 – 2018-03-20 (×2): 40 mg via ORAL
  Filled 2018-03-19 (×2): qty 1

## 2018-03-19 NOTE — Progress Notes (Signed)
  Echocardiogram 2D Echocardiogram has been performed.  Stephanie Harris 03/19/2018, 1:58 PM
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
## 2018-03-19 NOTE — Evaluation (Signed)
Occupational Therapy Evaluation Patient Details Name: Stephanie Harris MRN: [id number] DOB: 03-14-1931 Today's Date: 03/19/2018    History of Present Illness 84 y.o. female presenting with right sided facial droop . PMH is significant for Multiple TIAs with right carotid stent (2010), hemorrhagic stroke June 2018 without residual deficits, LGL, Chronic Hemolytic anemia and beta thalassemia trait, HTN, HLD, T2DM, stage 3 CKD, Hypothyroidism, CAD with stent 2009, lumbar compression fracture   Clinical Impression   This 84 yo female admitted with above presents to acute with balance deficits thus decreasing her safety with basic ADLs. She will benefit from acute OT without need for follow up.    Follow Up Recommendations  No OT follow up;Supervision/Assistance - 24 hour    Equipment Recommendations  None recommended by OT       Precautions / Restrictions Precautions Precautions: Fall Precaution Comments: back (history of multiple back issues) Restrictions Weight Bearing Restrictions: No      Mobility Bed Mobility Overal bed mobility: Needs Assistance Bed Mobility: Sit to Supine;Sidelying to Sit;Rolling Rolling: Supervision Sidelying to sit: Supervision       General bed mobility comments: Able to scoot to EOB with increased time  Transfers Overall transfer level: Needs assistance Equipment used: Rolling walker (2 wheeled) Transfers: Sit to/from Stand Sit to Stand: Min guard              Balance Overall balance assessment: History of Falls                                         ADL either performed or assessed with clinical judgement   ADL Overall ADL's : Needs assistance/impaired                                       General ADL Comments: overall at min guard A level when up on her feet with setup      Vision Patient Visual Report: No change from baseline              Pertinent Vitals/Pain Pain Assessment: Faces Faces  Pain Scale: Hurts little more Pain Location: back and right hand IV insertion site Pain Descriptors / Indicators: Sore Pain Intervention(s): Monitored during session;Repositioned     Hand Dominance Right   Extremity/Trunk Assessment Upper Extremity Assessment Upper Extremity Assessment: Overall WFL for tasks assessed           Communication Communication Communication: HOH   Cognition Arousal/Alertness: Awake/alert Behavior During Therapy: WFL for tasks assessed/performed Overall Cognitive Status: Within Functional Limits for tasks assessed                                                Home Living Family/patient expects to be discharged to:: Private residence Living Arrangements: Spouse/significant other;Children Available Help at Discharge: Available 24 hours/day Type of Home: Mobile home Home Access: Ramped entrance     Home Layout: One level     Bathroom Shower/Tub: Teacher, early years/pre: Handicapped height     Home Equipment: Environmental consultant - 4 wheels;Shower seat;Bedside commode;Cane - single point;Walker - 2 wheels   Additional Comments: Uses BSC over toilet at home  rior Functioning/Environment Level of Independence: Needs assistance  Gait / Transfers Assistance Needed: Pt ambulates with rollator.  ADL's / Homemaking Assistance Needed: Pt requires assist for ADLs/IADLs - spouse drives, assists with tub transfers, PRN assist for donning shoes, does most household chores   Comments: has an aide that comes in to help every day for IADLs        OT Problem List: Impaired balance (sitting and/or standing);Pain      OT Treatment/Interventions: Self-care/ADL training;Balance training;DME and/or AE instruction;Patient/family education    OT Goals(Current goals can be found in the care plan section) Acute Rehab OT Goals Patient Stated Goal: to go home  OT Goal Formulation: With patient Time For Goal Achievement:  04/02/18 Potential to Achieve Goals: Good  OT Frequency: Min 2X/week              AM-PAC PT "6 Clicks" Daily Activity     Outcome Measure Help from another person eating meals?: None Help from another person taking care of personal grooming?: A Little Help from another person toileting, which includes using toliet, bedpan, or urinal?: A Little Help from another person bathing (including washing, rinsing, drying)?: A Little Help from another person to put on and taking off regular upper body clothing?: A Little Help from another person to put on and taking off regular lower body clothing?: A Little 6 Click Score: 19   End of Session Equipment Utilized During Treatment: Gait belt;Rolling walker  Activity Tolerance: Patient tolerated treatment well Patient left: in bed;with call bell/phone within reach;with bed alarm set  OT Visit Diagnosis: Unsteadiness on feet (R26.81);Muscle weakness (generalized) (M62.81);Pain Pain - part of body: (back and hand IV site)                Time: 3612-2449 OT Time Calculation (min): 20 min Charges:  OT General Charges $OT Visit: 1 Visit OT Evaluation $OT Eval Moderate Complexity: [address] [place] [phone] 03/19/2018

## 2018-03-19 NOTE — Progress Notes (Signed)
SLP Cancellation Note  Patient Details Name: Stephanie Harris MRN: [id number] DOB: 11-Oct-1930   Cancelled treatment:       Reason Eval/Treat Not Completed: Patient's level of consciousness;Fatigue/lethargy limiting ability to participate. Patient was sleeping and unable to be aroused for SLE.    Sonia Baller, MA, CCC-SLP 03/19/18 6:17 PM

## 2018-03-19 NOTE — Evaluation (Signed)
Physical Therapy Evaluation Patient Details Name: Stephanie Harris MRN: [id number] DOB: 1930-12-30 Today's Date: 03/19/2018   History of Present Illness  84 y.o. female presenting with right sided facial droop . PMH is significant for Multiple TIAs with right carotid stent (2010), hemorrhagic stroke June 2018 without residual deficits, LGL, Chronic Hemolytic anemia and beta thalassemia trait, HTN, HLD, T2DM, stage 3 CKD, Hypothyroidism, CAD with stent 2009, lumbar compression fracture  Clinical Impression  Orders received for PT evaluation. Patient demonstrates deficits in functional mobility as indicated below. Will benefit from continued skilled PT to address deficits and maximize function. Will see as indicated and progress as tolerated.  Patient reports noted weakness and decreased activity tolerance. Patient wants to continue current home health program.     Follow Up Recommendations Supervision/Assistance - 24 hour([facility] home services)    Equipment Recommendations  None recommended by PT    Recommendations for Other Services       Precautions / Restrictions Precautions Precautions: Fall Precaution Comments: back (history of multiple back issues)      Mobility  Bed Mobility Overal bed mobility: Needs Assistance Bed Mobility: Rolling;Sidelying to Sit Rolling: Min guard Sidelying to sit: Min assist       General bed mobility comments: min assist to scoot to EOB, increased time and effort to perform.  Transfers Overall transfer level: Needs assistance Equipment used: Rolling walker (2 wheeled) Transfers: Sit to/from Stand Sit to Stand: Min assist         General transfer comment: min assist to stabilize RW and maintain stability during power up to stnading. Increased time and effort noted  Ambulation/Gait Ambulation/Gait assistance: Min guard Gait Distance (Feet): 50 Feet Assistive device: Rolling walker (2 wheeled) Gait Pattern/deviations: Step-through  pattern;Decreased stride length;Shuffle;Trunk flexed;Narrow base of support Gait velocity: decreased Gait velocity interpretation: <1.8 ft/sec, indicate of risk for recurrent falls General Gait Details: patient with some noted instability during ambulation, VCs for positioning and safety during mobility. easily fatigues  Stairs            Wheelchair Mobility    Modified Rankin (Stroke Patients Only)       Balance Overall balance assessment: History of Falls                                           Pertinent Vitals/Pain Pain Assessment: Faces Faces Pain Scale: Hurts even more Pain Location: back and right hand IV insertion site Pain Descriptors / Indicators: Sore Pain Intervention(s): Monitored during session    Home Living Family/patient expects to be discharged to:: Private residence Living Arrangements: Spouse/significant other;Children Available Help at Discharge: Available PRN/intermittently Type of Home: Mobile home Home Access: Ramped entrance     Home Layout: One level Home Equipment: Environmental consultant - 4 wheels;Shower seat;Bedside commode;Cane - single point;Walker - 2 wheels Additional Comments: Uses BSC over toilet at home    Prior Function Level of Independence: Needs assistance   Gait / Transfers Assistance Needed: Pt ambulates with rollator.   ADL's / Homemaking Assistance Needed: Pt requires assist for ADLs/IADLs - spouse drives, assists with tub transfers, PRN assist for donning shoes, does most household chores  Comments: has an aide that comes in to help every day     Hand Dominance   Dominant Hand: Right    Extremity/Trunk Assessment   Upper Extremity Assessment Upper Extremity Assessment: Defer to OT evaluation  Lower Extremity Assessment Lower Extremity Assessment: Generalized weakness    Cervical / Trunk Assessment Cervical / Trunk Assessment: (kyphotic)  Communication   Communication: HOH  Cognition  Arousal/Alertness: Awake/alert Behavior During Therapy: WFL for tasks assessed/performed Overall Cognitive Status: No family/caregiver present to determine baseline cognitive functioning                                        General Comments      Exercises     Assessment/Plan    PT Assessment Patient needs continued PT services  PT Problem List Decreased strength;Decreased activity tolerance;Decreased balance;Decreased mobility;Decreased safety awareness;Decreased knowledge of precautions;Pain       PT Treatment Interventions DME instruction;Gait training;Functional mobility training;Therapeutic activities;Therapeutic exercise;Balance training;Neuromuscular re-education;Patient/family education    PT Goals (Current goals can be found in the Care Plan section)  Acute Rehab PT Goals Patient Stated Goal: to go home and continue current home program PT Goal Formulation: With patient Time For Goal Achievement: 04/02/18 Potential to Achieve Goals: Good    Frequency Min 3X/week   Barriers to discharge Decreased caregiver support      Co-evaluation               AM-PAC PT "6 Clicks" Daily Activity  Outcome Measure Difficulty turning over in bed (including adjusting bedclothes, sheets and blankets)?: A Little Difficulty moving from lying on back to sitting on the side of the bed? : A Little Difficulty sitting down on and standing up from a chair with arms (e.g., wheelchair, bedside commode, etc,.)?: A Little Help needed moving to and from a bed to chair (including a wheelchair)?: A Little Help needed walking in hospital room?: A Little Help needed climbing 3-5 steps with a railing? : A Little 6 Click Score: 18    End of Session Equipment Utilized During Treatment: Gait belt Activity Tolerance: Patient tolerated treatment well Patient left: in chair;with call bell/phone within reach;with chair alarm set Nurse Communication: Mobility status PT Visit  Diagnosis: Unsteadiness on feet (R26.81);Difficulty in walking, not elsewhere classified (R26.2)    Time: 1028-1050 PT Time Calculation (min) (ACUTE ONLY): 22 min   Charges:   PT Evaluation $PT Eval Moderate Complexity: 1 Mod          Alben Deeds, PT DPT  Board Certified Neurologic Specialist [phone]   Duncan Dull 03/19/2018, 11:39 AM

## 2018-03-19 NOTE — Progress Notes (Signed)
STROKE TEAM PROGRESS NOTE   HISTORY OF PRESENT ILLNESS (per record) Stephanie Harris is an 84 y.o. female with PMH significant for HTN, hemorrhagic stroke July  2018 with no residual deficits,HLD, DM2, CML, and CAD who presents to [facility] as a code stroke for right facial droop, aphasia and HA.  Patient was at home where she noticed a  A sudden onset of HA with pain radiating from left to right side of head. Right facial droop and word finding difficulty. Patient states that she lives with her husband and walks with a walker at baseline. States that "she has cancer and has a stent. Patient unable to give much of her history d/t her word finding difficulty when asked questions. Per chart patient not on any anticoagulation. Prescribed ASA 81 mg daily. Patient states she takes ASA daily and has not missed any doses.  ED course:  BP on EMS arrival to scene was 200/75 and BG 114.10 mg of labetalol given in ED to keep SBP 160-180. Hgb 11.6 and HCT 34.0, platelets 140 CT head: no acute intracranial abnormality. Extensive chronic small vessel ischemic disease with small chronic infarcts.   Past strokes:  February 2016 July 2018 hemorrhagic stroke ( 47ml) in the left basal ganglia ( left putamen and posterior limb of internal capsule) with no residual deficits.   Date last known well: Date: 03/18/2018 Time last known well: Time: 09:30 tPA Given: No:  Hx of ICH Modified Rankin: Rankin Score=2     SUBJECTIVE (INTERVAL HISTORY) Back to baseline.  No complaints. MRI - no acute infarct; periventricular small vessel ischemic disease LDL 71 TG 52 A1c 5.0 TTE pending CDUS pending    OBJECTIVE Temp:  [97.5 F (36.4 C)-98.5 F (36.9 C)] 97.6 F (36.4 C) (07/27 1230) Pulse Rate:  [69-91] 71 (07/27 1230) Cardiac Rhythm: Normal sinus rhythm (07/27 0700) Resp:  [13-21] 18 (07/27 1230) BP: (109-153)/(44-63) 115/52 (07/27 1230) SpO2:  [97 %-100 %] 100 % (07/27 1230) Weight:  [102 lb 8.2 oz (46.5  kg)] 102 lb 8.2 oz (46.5 kg) (07/26 1732)  CBC:  Recent Labs  Lab 03/18/18 1118 03/18/18 1131 03/19/18 0358  WBC 7.2  --  6.8  NEUTROABS 2.4  --   --   HGB 9.8* 11.6* 8.6*  HCT 32.5* 34.0* 28.6*  MCV 63.6*  --  63.0*  PLT 140*  --  122*    Basic Metabolic Panel:  Recent Labs  Lab 03/18/18 1118 03/18/18 1131 03/19/18 0358  NA 140 140 141  K 3.8 3.8 3.8  CL 108 106 105  CO2 20*  --  26  GLUCOSE 105* 103* 92  BUN 17 17 20   CREATININE 1.02* 1.00 1.29*  CALCIUM 9.7  --  9.1    Lipid Panel:     Component Value Date/Time   CHOL 127 03/19/2018 0358   TRIG 52 03/19/2018 0358   HDL 46 03/19/2018 0358   CHOLHDL 2.8 03/19/2018 0358   VLDL 10 03/19/2018 0358   LDLCALC 71 03/19/2018 0358   HgbA1c:  Lab Results  Component Value Date   HGBA1C 5.0 03/19/2018   Urine Drug Screen: No results found for: LABOPIA, COCAINSCRNUR, LABBENZ, AMPHETMU, THCU, LABBARB  Alcohol Level     Component Value Date/Time   [facility] <5 03/23/2017 1635    IMAGING   Mr Brain Wo Contrast 03/18/2018 IMPRESSION:  1. No acute intracranial abnormality.  2. Extensive chronic small vessel ischemic disease with numerous chronic infarcts.      Ct  Head Code Stroke Wo Contrast 03/18/2018 IMPRESSION:  1. No evidence of acute intracranial abnormality.  2. ASPECTS is 10.  3. Extensive chronic small vessel ischemic disease with numerous small chronic infarcts.       Transthoracic Echocardiogram - pending 00/00/00    Bilateral Carotid Dopplers - pending 00/00/00     PHYSICAL EXAM Vitals:   03/19/18 0000 03/19/18 0304 03/19/18 0924 03/19/18 1230  BP: (!) 125/57 (!) 109/44  (!) 115/52  Pulse: 79 71  71  Resp: 18 13  18   Temp: 98.5 F (36.9 C) 98.4 F (36.9 C) (!) 97.5 F (36.4 C) 97.6 F (36.4 C)  TempSrc: Oral Oral Oral Oral  SpO2: 98% 98%  100%  Weight:      Height:        Awake, alert, oriented fully. Language- fluent; comprehension, naming, repetition - intact. Face  symmetrical.  Tongue midline. EOMI, PERL. Strength 5/5 BUE and BLE. Coord - intact FTN bilateral. Sensory - normal.         ASSESSMENT/PLAN Stephanie Harris is a 84 y.o. female with history of TIAs, diabetes mellitus, previous stroke, rheumatoid arthritis, peripheral vascular disease, panic disorder, hypothyroidism, coronary artery disease with MI, leukemia, previous intracerebral hemorrhage, hypertension, headaches, and anxiety presenting with right facial droop, aphasia, and headache. She did not receive IV t-PA due to history of intracerebral hemorrhage  Possible TIA:    Resultant  Right facial droop and aphasia.  CT head - no acute abnormality - small vessel disease -multiple chronic infarcts  MRI head - no acute abnormality - multiple chronic infarcts  MRA head - not performed  Carotid Doppler - pending  2D Echo - pending  LDL - 71  HgbA1c - 5.0  VTE prophylaxis - Lovenox Diet Order           Diet Heart Room service appropriate? Yes; Fluid consistency: Thin  Diet effective now          aspirin 81 mg daily prior to admission, now on aspirin 81 mg daily  Patient counseled to be compliant with her antithrombotic medications  Ongoing aggressive stroke risk factor management  Therapy recommendations: No follow-up therapy recommended  Disposition:  Pending  Hypertension  Stable  Permissive hypertension (OK if < 220/120) but gradually normalize in 5-7 days . Long-term BP goal normotensive  Hyperlipidemia  Lipid lowering medication PTA:  none  LDL 71, goal < 70  Current lipid lowering medication: Lipitor 40 mg daily  Continue statin at discharge  Diabetes  HgbA1c 5.0, goal < 7.0  Controlled  Other Stroke Risk Factors  Advanced age  Hx stroke/TIA  Family hx stroke (mother and brother)  Coronary artery disease   Other Active Problems  Anemia  Thrombocytopenia  Creatinine - 1.29    Hospital day # 1  Probable TIA involving  the left frontal lobe.  BP was high on arrival, but very well controlled off treatment.  Lipids are well controlled on admission, but started on statin too.  No diabetes or smoking.  Awaiting status of carotid arteries and heart.  Continue ASA monotherapy for now.    Rogue Jury, MS, MD  To contact Stroke Continuity provider, please refer to [email or web address] After hours, contact General Neurology

## 2018-03-20 ENCOUNTER — Inpatient Hospital Stay (HOSPITAL_COMMUNITY): Payer: Medicare Other

## 2018-03-20 DIAGNOSIS — I6529 Occlusion and stenosis of unspecified carotid artery: Secondary | ICD-10-CM

## 2018-03-20 DIAGNOSIS — G459 Transient cerebral ischemic attack, unspecified: Secondary | ICD-10-CM

## 2018-03-20 LAB — BASIC METABOLIC PANEL
Anion gap: 10 (ref 5–15)
BUN: 25 mg/dL — ABNORMAL HIGH (ref 8–23)
CO2: 24 mmol/L (ref 22–32)
Calcium: 9.6 mg/dL (ref 8.9–10.3)
Chloride: 106 mmol/L (ref 98–111)
Creatinine, Ser: 1.23 mg/dL — ABNORMAL HIGH (ref 0.44–1.00)
GFR calc Af Amer: 45 mL/min — ABNORMAL LOW (ref >60)
GFR calc non Af Amer: 39 mL/min — ABNORMAL LOW (ref >60)
Glucose, Bld: 122 mg/dL — ABNORMAL HIGH (ref 70–99)
Potassium: 3.8 mmol/L (ref 3.5–5.1)
Sodium: 140 mmol/L (ref 135–145)

## 2018-03-20 LAB — GLUCOSE, CAPILLARY: Glucose-Capillary: 121 mg/dL — ABNORMAL HIGH (ref 70–99)

## 2018-03-20 LAB — CBC
HCT: 32.2 % — ABNORMAL LOW (ref 36.0–46.0)
Hemoglobin: 9.5 g/dL — ABNORMAL LOW (ref 12.0–15.0)
MCH: 18.8 pg — ABNORMAL LOW (ref 26.0–34.0)
MCHC: 29.5 g/dL — ABNORMAL LOW (ref 30.0–36.0)
MCV: 63.9 fL — ABNORMAL LOW (ref 78.0–100.0)
Platelets: 126 10*3/uL — ABNORMAL LOW (ref 150–400)
RBC: 5.04 MIL/uL (ref 3.87–5.11)
RDW: 18.2 % — ABNORMAL HIGH (ref 11.5–15.5)
WBC: 4.7 10*3/uL (ref 4.0–10.5)
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
## 2018-03-20 NOTE — Progress Notes (Signed)
STROKE TEAM PROGRESS NOTE   HISTORY OF PRESENT ILLNESS (per record) FREDDYE CARDAMONE is an 84 y.o. female with PMH significant for HTN, hemorrhagic stroke July  2018 with no residual deficits,HLD, DM2, CML, and CAD who presents to [facility] as a code stroke for right facial droop, aphasia and HA.  Patient was at home where she noticed a  A sudden onset of HA with pain radiating from left to right side of head. Right facial droop and word finding difficulty. Patient states that she lives with her husband and walks with a walker at baseline. States that "she has cancer and has a stent. Patient unable to give much of her history d/t her word finding difficulty when asked questions. Per chart patient not on any anticoagulation. Prescribed ASA 81 mg daily. Patient states she takes ASA daily and has not missed any doses.  ED course:  BP on EMS arrival to scene was 200/75 and BG 114.10 mg of labetalol given in ED to keep SBP 160-180. Hgb 11.6 and HCT 34.0, platelets 140 CT head: no acute intracranial abnormality. Extensive chronic small vessel ischemic disease with small chronic infarcts.   Past strokes:  February 2016 July 2018 hemorrhagic stroke ( 30ml) in the left basal ganglia ( left putamen and posterior limb of internal capsule) with no residual deficits.   Date last known well: Date: 03/18/2018 Time last known well: Time: 09:30 tPA Given: No:  Hx of ICH Modified Rankin: Rankin Score=2     SUBJECTIVE (INTERVAL HISTORY) Back to baseline.  No complaints. MRI - no acute infarct; periventricular small vessel ischemic disease LDL 71 TG 52 A1c 5.0 TTE - LVEF 65-70% CDUS - prelim tech read - right ICA post-stent 50-75%, left ICA 40--59%    OBJECTIVE Temp:  [97.5 F (36.4 C)-98.7 F (37.1 C)] 98.3 F (36.8 C) (07/28 1253) Pulse Rate:  [71-81] 76 (07/28 1253) Cardiac Rhythm: Normal sinus rhythm (07/28 0733) Resp:  [12-16] 16 (07/28 1253) BP: (114-156)/(47-63) 141/60 (07/28  1253) SpO2:  [97 %-100 %] 98 % (07/28 1253)  CBC:  Recent Labs  Lab 03/18/18 1118  03/19/18 0358 03/20/18 0820  WBC 7.2  --  6.8 4.7  NEUTROABS 2.4  --   --   --   HGB 9.8*   < > 8.6* 9.5*  HCT 32.5*   < > 28.6* 32.2*  MCV 63.6*  --  63.0* 63.9*  PLT 140*  --  122* 126*   < > = values in this interval not displayed.    Basic Metabolic Panel:  Recent Labs  Lab 03/19/18 0358 03/20/18 0820  NA 141 140  K 3.8 3.8  CL 105 106  CO2 26 24  GLUCOSE 92 122*  BUN 20 25*  CREATININE 1.29* 1.23*  CALCIUM 9.1 9.6    Lipid Panel:     Component Value Date/Time   CHOL 127 03/19/2018 0358   TRIG 52 03/19/2018 0358   HDL 46 03/19/2018 0358   CHOLHDL 2.8 03/19/2018 0358   VLDL 10 03/19/2018 0358   LDLCALC 71 03/19/2018 0358   HgbA1c:  Lab Results  Component Value Date   HGBA1C 5.0 03/19/2018   Urine Drug Screen: No results found for: LABOPIA, COCAINSCRNUR, LABBENZ, AMPHETMU, THCU, LABBARB  Alcohol Level     Component Value Date/Time   [facility] <5 03/23/2017 1635    IMAGING   Mr Brain Wo Contrast 03/18/2018 IMPRESSION:  1. No acute intracranial abnormality.  2. Extensive chronic small vessel ischemic disease with  numerous chronic infarcts.      Ct Head Code Stroke Wo Contrast 03/18/2018 IMPRESSION:  1. No evidence of acute intracranial abnormality.  2. ASPECTS is 10.  3. Extensive chronic small vessel ischemic disease with numerous small chronic infarcts.       Transthoracic Echocardiogram  03/19/2018 Study Conclusions  - Left ventricle: The cavity size was normal. Wall thickness was   normal. Systolic function was vigorous. The estimated ejection   fraction was in the range of 65% to 70%. Wall motion was normal;   there were no regional wall motion abnormalities. The study is   not technically sufficient to allow evaluation of LV diastolic   function. - Aortic valve: Sclerosis without stenosis. There was trivial   regurgitation. - Mitral valve: Mildly  thickened leaflets . There was trivial   regurgitation. - Left atrium: The atrium was normal in size. - Tricuspid valve: There was trivial regurgitation. - Pulmonary arteries: PA peak pressure: 28 mm Hg (S). - Inferior vena cava: The vessel was normal in size. The   respirophasic diameter changes were in the normal range (>= 50%),   consistent with normal central venous pressure.  Impressions:  - LVEF 65-70%, normal wall thickness, normal wall motion, aortic   valve sclerosis with trivial AI, trivial MR, normal LA size,   trivial TR, RVSP 28 mmHg, normal IVC.    Bilateral Carotid Dopplers 00/00/00 Preliminary notes   Right ICA stent evaluated, possible 50-75% post stent stenosis according to the velocity category of diagnostic criteria;  Left CCA distal findings: Hyperechoic material seen at distal portion of CCA. Left ICA demonstrates up to 40-59% stenosis based on velocity categories, however, Velocities may underestimate degree of stenosis due to more proximal obstruction.  Bilateral vertebral arteries patent with antegrade flow.       PHYSICAL EXAM Vitals:   03/19/18 2337 03/20/18 0433 03/20/18 0733 03/20/18 1253  BP: (!) 150/62 (!) 132/47 (!) 156/61 (!) 141/60  Pulse: 81 71 72 76  Resp: 16 16 16 16   Temp: 98.2 F (36.8 C) (!) 97.5 F (36.4 C) 97.9 F (36.6 C) 98.3 F (36.8 C)  TempSrc: Oral Oral Oral Oral  SpO2: 100% 98% 98% 98%  Weight:      Height:        Awake, alert, oriented fully. Language- fluent; comprehension, naming, repetition - intact. Face symmetrical.  Tongue midline. EOMI, PERL. Strength 5/5 BUE and BLE. Coord - intact FTN bilateral. Sensory - normal.         ASSESSMENT/PLAN Ms. SAPPHIRE TYGART is a 84 y.o. female with history of TIAs, diabetes mellitus, previous stroke, rheumatoid arthritis, peripheral vascular disease, panic disorder, hypothyroidism, coronary artery disease with MI, leukemia, previous intracerebral hemorrhage,  hypertension, headaches, and anxiety presenting with right facial droop, aphasia, and headache. She did not receive IV t-PA due to history of intracerebral hemorrhage  Possible TIA:    Resultant  Right facial droop and aphasia.  CT head - no acute abnormality - small vessel disease -multiple chronic infarcts  MRI head - no acute abnormality - multiple chronic infarcts  MRA head - not performed  Carotid Doppler - see above preliminary report.  2D Echo - EF 65 - 70% No cardiac source of emboli identified.  LDL - 71  HgbA1c - 5.0  VTE prophylaxis - Lovenox Diet Order           Diet regular Room service appropriate? Yes; Fluid consistency: Thin  Diet effective now  aspirin 81 mg daily prior to admission, now on aspirin 81 mg daily  Patient counseled to be compliant with her antithrombotic medications  Ongoing aggressive stroke risk factor management  Therapy recommendations: No follow-up therapy recommended  Disposition:  Pending  Hypertension  Stable  Permissive hypertension (OK if < 220/120) but gradually normalize in 5-7 days . Long-term BP goal normotensive  Hyperlipidemia  Lipid lowering medication PTA:  none  LDL 71, goal < 70  Current lipid lowering medication: Lipitor 40 mg daily  Continue statin at discharge  Diabetes  HgbA1c 5.0, goal < 7.0  Controlled  Other Stroke Risk Factors  Advanced age  Hx stroke/TIA  Family hx stroke (mother and brother)  Coronary artery disease   Other Active Problems  Anemia  Thrombocytopenia  Creatinine - 1.29 -> 1.23    Hospital day # 2  Probable TIA involving the left frontal lobe.  Lipids are well controlled on admission, but started on statin too.  No diabetes or smoking.  No cardioembolic source identified.  Awaiting official radiologist read of CDUS; right ICA may require attention to the stent, but not related to her current presentation.  The left ICA does not seem to have a relevant  degree of stenosis.  May consider CT Angiogram for more detailed view of stent and carotids if deemed needed after official read.    Continue ASA monotherapy for now.    Rogue Jury, MS, MD    To contact Stroke Continuity provider, please refer to [email or web address] After hours, contact General Neurology

## 2018-03-20 NOTE — Progress Notes (Signed)
Preliminary notes--Bilateral Carotid duplex exam completed.  Right ICA stent evaluated, possible 50-75% post stent stenosis according to the velocity category of diagnostic criteria;  Left CCA distal findings: Hyperechoic material seen at distal portion of CCA. Left ICA demonstrates up to 40-59% stenosis based on velocity categories, however, Velocities may underestimate degree of stenosis due to more proximal obstruction. Bilateral vertebral arteries patent with antegrade flow.  Hongying Hernandez [place] (RDMS RVT) 03/20/18 12:17 PM

## 2018-03-21 ENCOUNTER — Encounter: Payer: Medicare Other | Admitting: Physical Therapy

## 2018-03-21 ENCOUNTER — Inpatient Hospital Stay (HOSPITAL_COMMUNITY): Payer: Medicare Other

## 2018-03-21 LAB — CBC
HCT: 31.2 % — ABNORMAL LOW (ref 36.0–46.0)
Hemoglobin: 9.2 g/dL — ABNORMAL LOW (ref 12.0–15.0)
MCH: 18.8 pg — ABNORMAL LOW (ref 26.0–34.0)
MCHC: 29.5 g/dL — ABNORMAL LOW (ref 30.0–36.0)
MCV: 63.8 fL — ABNORMAL LOW (ref 78.0–100.0)
Platelets: 126 10*3/uL — ABNORMAL LOW (ref 150–400)
RBC: 4.89 MIL/uL (ref 3.87–5.11)
RDW: 17.6 % — ABNORMAL HIGH (ref 11.5–15.5)
WBC: 6.4 10*3/uL (ref 4.0–10.5)

## 2018-03-21 LAB — BASIC METABOLIC PANEL
Anion gap: 9 (ref 5–15)
BUN: 27 mg/dL — ABNORMAL HIGH (ref 8–23)
CO2: 26 mmol/L (ref 22–32)
Calcium: 9.5 mg/dL (ref 8.9–10.3)
Chloride: 105 mmol/L (ref 98–111)
Creatinine, Ser: 1.11 mg/dL — ABNORMAL HIGH (ref 0.44–1.00)
GFR calc Af Amer: 51 mL/min — ABNORMAL LOW (ref >60)
GFR calc non Af Amer: 44 mL/min — ABNORMAL LOW (ref >60)
Glucose, Bld: 96 mg/dL (ref 70–99)
Potassium: 4 mmol/L (ref 3.5–5.1)
Sodium: 140 mmol/L (ref 135–145)

## 2018-03-21 MED ORDER — ATORVASTATIN CALCIUM 10 MG PO TABS
20.0000 mg | ORAL_TABLET | Freq: Every day | ORAL | Status: DC
  Administered 2018-03-21: 20 mg via ORAL
  Filled 2018-03-21: qty 2

## 2018-03-21 MED ORDER — ACETAMINOPHEN 325 MG PO TABS: 650.0000 mg | ORAL_TABLET | Freq: Four times a day (QID) | ORAL | 0 refills | Status: DC | PRN

## 2018-03-21 MED ORDER — ATORVASTATIN CALCIUM 20 MG PO TABS: 20.0000 mg | ORAL_TABLET | Freq: Every day | ORAL | 0 refills | Status: DC

## 2018-03-21 MED ORDER — IOPAMIDOL (ISOVUE-370) INJECTION 76%
50.0000 mL | Freq: Once | INTRAVENOUS | Status: AC | PRN
  Administered 2018-03-21: 50 mL via INTRAVENOUS

## 2018-03-21 NOTE — Evaluation (Signed)
Speech Language Pathology Evaluation Patient Details Name: Stephanie Harris MRN: [id number] DOB: 27-Jul-1931 Today's Date: 03/21/2018 Time: 1009-1020 SLP Time Calculation (min) (ACUTE ONLY): 11 min  Problem List:  Patient Active Problem List   Diagnosis Date Noted  . Stenosis of carotid artery   . Hypertensive emergency   . Protein-calorie malnutrition, severe 11/08/2017  . Lumbar compression fracture ([place]) 11/05/2017  . Closed fracture of proximal end of left humerus with routine healing 08/28/2016  . Acute cystitis 04/28/2016  . Chest pain 03/14/2015  . TIA (transient ischemic attack) 01/29/2015  . Allergic arthritis, hand    Past Medical History:  Past Medical History:  Diagnosis Date  . Acute bronchiolitis   . Acute pharyngitis   . Allergic rhinitis due to pollen   . Anemia, unspecified    CKD and LGL  . Anxiety state    unspecified  . Arthritis    "knees, legs" (03/18/2018)  . Asthma without status asthmaticus    unspecified  . B12 deficiency   . Beta thalassemia trait   . Carotid artery occlusion   . Cellulitis and abscess of leg, except foot   . Cervical spondylosis   . Cervicalgia   . Chronic kidney disease   . Chronic lower back pain   . CML (chronic myelocytic leukemia) ([place])    Onc at [facility]  . Complication of anesthesia    "last back surgery they liked to never get me awake" (03/18/2018)  . Coronary artery disease 2009   heart attack with stent  . Coronary atherosclerosis of native coronary artery   . Depressive disorder    not elsewhere classified  . Difficulty in walking   . Disorder of breast, unspecified   . Dizziness and giddiness   . Dysuria   . Esophageal reflux   . Essential hypertension    unspecified  . Family history of adverse reaction to anesthesia    "liked to never get my sister awake" (03/18/2018)  . Head injury    unspecified  . Headache    "q time I have a stroke" (03/18/2018)  . Heart disease   . Hematuria, unspecified   . History  of blood transfusion    "had 22 when they found out I had leukemia" (03/18/2018)  . Hypersomnia, unspecified   . Hypertension   . Hypopotassemia   . Hypothyroidism   . ICH (intracerebral hemorrhage) ([place])   . Ill-defined cerebrovascular disease    other  . Insomnia    unspecified  . 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 Leukemia ([place])   . Lower urinary tract infection   . Lumbago   . Mini stroke ([place])    x years  . Mixed hyperlipidemia   . Myocardial infarction ([place]) ~2017  . Nontoxic nodular goiter    unspecified  . Occlusion and stenosis of unspecified carotid artery    without mention of cerebral infarction  . Osteoarthritis   . Osteoporosis   . Other constipation   . Other vitamin B12 deficiency anemias   . Otitis media, unspecified, unspecified ear   . Ovarian failure    unspecified  . Pain in limb   . Panic disorder without agoraphobia   . Peripheral vascular disease ([place])    unspecified  . Phlebitis of left arm   . RA (rheumatoid arthritis) ([place])   . Sigmoid polyp 1998  . Sleep disturbance    unspecified  . Stroke [facility]) 02/2017   "lots of mini strokes; big one 02/2017;  that one made me weak in my knees,; never fully recovered" (03/18/2018)  . Syncope and collapse   . Thalassemia   . TIA (transient ischemic attack)    2000 and 2008 right carotid stent 08/14/2009 on Plavix  . TIA (transient ischemic attack) 03/18/2018  . Transient disorder of initiating or maintaining sleep   . Type II diabetes mellitus ([place])   . Varicose veins of bilateral lower extremities with other complications   . Varicose veins of bilateral lower extremities with other complications   . Vision abnormalities    Past Surgical History:  Past Surgical History:  Procedure Laterality Date  . BACK SURGERY    . CAROTID ENDARTERECTOMY Left   . CAROTID STENT INSERTION Right    "have 2 stents in there" (03/18/2018)  . CATARACT EXTRACTION W/ INTRAOCULAR LENS  IMPLANT, BILATERAL  Bilateral 06/16/2001 - 05/28/2003   +23.5D     22.5D  . CHOLECYSTECTOMY OPEN  1980  . COLONOSCOPY  2010  . CORONARY ANGIOPLASTY WITH STENT PLACEMENT    . EYELID SURGERY Bilateral 2005   BUL BLEPH  . FRACTURE SURGERY    . JOINT REPLACEMENT    . KYPHOPLASTY N/A 08/12/2017   Procedure: KYPHOPLASTY;  Surgeon: Hessie Knows, MD;  Location: [facility] ORS;  Service: Orthopedics;  Laterality: N/A;  . KYPHOPLASTY N/A 11/08/2017   Procedure: Hewitt Shorts;  Surgeon: Hessie Knows, MD;  Location: [facility] ORS;  Service: Orthopedics;  Laterality: N/A;  . PERCUTANEOUS PLACEMENT INTRAVASCULAR STENT CERVICAL CAROTID ARTERY  06/14/2009  . ROTATOR CUFF REPAIR Left   . TOTAL ABDOMINAL HYSTERECTOMY  1978   WITH REMOVAL TUBES & /OR OVARIES  . TOTAL KNEE ARTHROPLASTY Right    HPI:  84 y.o. female presenting with right sided facial droop . PMH is significant for Multiple TIAs with right carotid stent (2010), hemorrhagic stroke June 2018 without residual deficits, LGL, Chronic Hemolytic anemia and beta thalassemia trait, HTN, HLD, T2DM, stage 3 CKD, Hypothyroidism, CAD with stent 2009, lumbar compression fracture. MRI revealed no acute intracranial abnormality but xxtensive chronic small vessel ischemic disease with numerous chronic infarcts.   Assessment / Plan / Recommendation Clinical Impression  Pt presents with functional cognitive linguistic abilities and no evidence of word finding deficts with extensive conversation. ST to sign off at this time.    SLP Assessment  SLP Recommendation/Assessment: Patient does not need any further Speech Lanaguage Pathology Services SLP Visit Diagnosis: Cognitive communication deficit (R41.841)    Follow Up Recommendations  None    Frequency and Duration           SLP Evaluation Cognition  Overall Cognitive Status: Within Functional Limits for tasks assessed Arousal/Alertness: Awake/alert Orientation Level: Oriented X4       Comprehension  Auditory  Comprehension Overall Auditory Comprehension: Appears within functional limits for tasks assessed Visual Recognition/Discrimination Discrimination: Not tested Reading Comprehension Reading Status: Not tested    Expression Expression Primary Mode of Expression: Verbal Verbal Expression Overall Verbal Expression: Appears within functional limits for tasks assessed Written Expression Dominant Hand: Right Written Expression: Within Functional Limits   Oral / Motor  Oral Motor/Sensory Function Overall Oral Motor/Sensory Function: Within functional limits Motor Speech Overall Motor Speech: Appears within functional limits for tasks assessed   GO                    Britiny Defrain 03/21/2018, 12:19 PM
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
## 2018-03-21 NOTE — Plan of Care (Signed)

## 2018-03-21 NOTE — Progress Notes (Signed)
Physical Therapy Treatment Patient Details Name: Stephanie Harris MRN: [id number] DOB: [birth year] Today's Date: 03/21/2018    History of Present Illness 84 y.o. female presenting with right sided facial droop . PMH is significant for Multiple TIAs with right carotid stent (2010), hemorrhagic stroke June 2018 without residual deficits, LGL, Chronic Hemolytic anemia and beta thalassemia trait, HTN, HLD, T2DM, stage 3 CKD, Hypothyroidism, CAD with stent 2009, lumbar compression fracture    PT Comments    Patient is making progress toward PT goals. Current plan remains appropriate.    Follow Up Recommendations  Supervision/Assistance - 24 hour(resume home services)     Equipment Recommendations  None recommended by PT    Recommendations for Other Services       Precautions / Restrictions Precautions Precautions: Fall Precaution Comments: back (history of multiple back issues) Restrictions Weight Bearing Restrictions: No    Mobility  Bed Mobility Overal bed mobility: Needs Assistance Bed Mobility: Sit to Supine;Sidelying to Sit;Rolling Rolling: Supervision   Supine to sit: Min assist Sit to supine: Min guard   General bed mobility comments: assist to scoot hips to EOB  Transfers Overall transfer level: Needs assistance Equipment used: None Transfers: Sit to/from Stand Sit to Stand: Min guard         General transfer comment: min guard for balance and safety  Ambulation/Gait Ambulation/Gait assistance: Min guard Gait Distance (Feet): 150 Feet Assistive device: (rollator) Gait Pattern/deviations: Step-through pattern;Decreased stride length;Trunk flexed;Narrow base of support Gait velocity: decreased   General Gait Details: pt with flexed posture and grossly steady gait with use of AD   Stairs             Wheelchair Mobility    Modified Rankin (Stroke Patients Only) Modified Rankin (Stroke Patients Only) Pre-Morbid Rankin Score: Moderate  disability Modified Rankin: Moderate disability     Balance Overall balance assessment: History of Falls                                          Cognition Arousal/Alertness: Awake/alert Behavior During Therapy: WFL for tasks assessed/performed Overall Cognitive Status: Within Functional Limits for tasks assessed                                        Exercises      General Comments        Pertinent Vitals/Pain Pain Assessment: Faces Faces Pain Scale: Hurts a little bit Pain Location: L shoulder Pain Descriptors / Indicators: Sore;Aching Pain Intervention(s): Monitored during session;Repositioned;Heat applied    Home Living                      Prior Function            PT Goals (current goals can now be found in the care plan section) Acute Rehab PT Goals Patient Stated Goal: to go home  PT Goal Formulation: With patient Time For Goal Achievement: 04/02/18 Potential to Achieve Goals: Good Progress towards PT goals: Progressing toward goals    Frequency    Min 3X/week      PT Plan Current plan remains appropriate    Co-evaluation              AM-PAC PT "6 Clicks" Daily Activity  Outcome Measure  Difficulty turning over  in bed (including adjusting bedclothes, sheets and blankets)?: A Little Difficulty moving from lying on back to sitting on the side of the bed? : A Little Difficulty sitting down on and standing up from a chair with arms (e.g., wheelchair, bedside commode, etc,.)?: A Little Help needed moving to and from a bed to chair (including a wheelchair)?: A Little Help needed walking in hospital room?: A Little Help needed climbing 3-5 steps with a railing? : A Little 6 Click Score: 18    End of Session Equipment Utilized During Treatment: Gait belt Activity Tolerance: Patient tolerated treatment well Patient left: with call bell/phone within reach;in bed;with bed alarm set Nurse Communication:  Mobility status PT Visit Diagnosis: Unsteadiness on feet (R26.81);Difficulty in walking, not elsewhere classified (R26.2)     Time: 6433-2951 PT Time Calculation (min) (ACUTE ONLY): 23 min  Charges:  $Gait Training: 8-22 mins $Therapeutic Activity: 8-22 mins                     Earney Navy, PTA Pager: [phone]     Darliss Cheney 03/21/2018, 4:40 PM

## 2018-03-21 NOTE — Care Management Important Message (Signed)
Important Message  Patient Details  Name: Stephanie Harris MRN: [id number] Date of Birth: September 20, 1930   Medicare Important Message Given:  Yes    Orbie Pyo 03/21/2018, 2:57 PM

## 2018-03-21 NOTE — Discharge Instructions (Signed)
Please continue taking your Aspirin 81mg  daily.
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
## 2018-03-21 NOTE — Progress Notes (Signed)
Occupational Therapy Treatment Patient Details Name: Stephanie Harris MRN: [id number] DOB: September 03, 1930 Today's Date: 03/21/2018    History of present illness 84 y.o. female presenting with right sided facial droop . PMH is significant for Multiple TIAs with right carotid stent (2010), hemorrhagic stroke June 2018 without residual deficits, LGL, Chronic Hemolytic anemia and beta thalassemia trait, HTN, HLD, T2DM, stage 3 CKD, Hypothyroidism, CAD with stent 2009, lumbar compression fracture   OT comments  Pt making progress towards goals this session. She required encouragement for ambulation but agreeable to OT intervention. Pt ambulating 15' in room to bathroom with RW and min guard. Pt performed toilet transfer and clothing management with overall min guard for safety with balance and reliance on RW. Pt standing to wash hands at sink with close supervision. Pt returning to bed at end of session with call bell and all needs within reach. Pt continues to benefit from OT intervention.    Follow Up Recommendations  No OT follow up;Supervision/Assistance - 24 hour    Equipment Recommendations  None recommended by OT    Recommendations for Other Services Other (comment)(none at this time)    Precautions / Restrictions Precautions Precautions: Fall Precaution Comments: back (history of multiple back issues) Restrictions Weight Bearing Restrictions: No       Mobility Bed Mobility Overal bed mobility: Needs Assistance Bed Mobility: Sit to Supine;Sidelying to Sit;Rolling Rolling: Supervision   Supine to sit: Min guard Sit to supine: Min guard      Transfers Overall transfer level: Needs assistance Equipment used: Rolling walker (2 wheeled) Transfers: Sit to/from Stand Sit to Stand: Min guard         General transfer comment: min guard for balance and safety    Balance Overall balance assessment: History of Falls       ADL either performed or assessed with clinical judgement    ADL       Toilet Transfer: Min guard;RW;Comfort height toilet   Toileting- Clothing Manipulation and Hygiene: Min guard;Sit to/from stand         General ADL Comments: overall min guard for functional transfers, ambulation, and hygiene with use of RW     Vision Patient Visual Report: No change from baseline            Cognition Arousal/Alertness: Awake/alert Behavior During Therapy: WFL for tasks assessed/performed Overall Cognitive Status: Within Functional Limits for tasks assessed                      Pertinent Vitals/ Pain       Pain Assessment: Faces Faces Pain Scale: Hurts little more Pain Location: L shoulder Pain Descriptors / Indicators: Sore;Aching Pain Intervention(s): Monitored during session;Repositioned;Other (comment)(requesting Kpad from RN)  Home Living     Available Help at Discharge: Available 24 hours/day Type of Home: Mobile home      Lives With: Spouse;Son        Frequency  Min 2X/week        Progress Toward Goals  OT Goals(current goals can now be found in the care plan section)  Progress towards OT goals: Progressing toward goals     Plan Discharge plan remains appropriate       AM-PAC PT "6 Clicks" Daily Activity     Outcome Measure   Help from another person eating meals?: None Help from another person taking care of personal grooming?: A Little Help from another person toileting, which includes using toliet, bedpan, or urinal?: A Little Help  from another person bathing (including washing, rinsing, drying)?: A Little Help from another person to put on and taking off regular upper body clothing?: A Little Help from another person to put on and taking off regular lower body clothing?: A Little 6 Click Score: 19    End of Session Equipment Utilized During Treatment: Gait belt;Rolling walker  OT Visit Diagnosis: Unsteadiness on feet (R26.81);Muscle weakness (generalized) (M62.81);Pain Pain - Right/Left:  Left Pain - part of body: Shoulder   Activity Tolerance Patient tolerated treatment well   Patient Left in bed;with call bell/phone within reach;with bed alarm set   Nurse Communication          Time: 7322-5672 OT Time Calculation (min): 20 min  Charges: OT General Charges $OT Visit: 1 Visit OT Treatments $Self Care/Home Management : 8-22 mins   Gayland Nicol P, MS, OTR/L 03/21/2018, 3:17 PM

## 2018-03-21 NOTE — Progress Notes (Signed)
STROKE TEAM PROGRESS NOTE   SUBJECTIVE (INTERVAL HISTORY) Patient back to baseline.  Complaint of stress at home with husband.  She admitted that she had headache bifrontal prior to right facial droop and difficulty getting words out.  MRI negative for acute infarct.  OBJECTIVE Temp:  [97.6 F (36.4 C)-98.3 F (36.8 C)] 98 F (36.7 C) (07/29 0744) Pulse Rate:  [76-88] 79 (07/29 0744) Cardiac Rhythm: Normal sinus rhythm (07/29 0700) Resp:  [13-17] 17 (07/29 0744) BP: (120-154)/(53-88) 142/53 (07/29 0744) SpO2:  [96 %-100 %] 99 % (07/29 0744)  CBC:  Recent Labs  Lab 03/18/18 1118  03/20/18 0820 03/21/18 0704  WBC 7.2   < > 4.7 6.4  NEUTROABS 2.4  --   --   --   HGB 9.8*   < > 9.5* 9.2*  HCT 32.5*   < > 32.2* 31.2*  MCV 63.6*   < > 63.9* 63.8*  PLT 140*   < > 126* 126*   < > = values in this interval not displayed.    Basic Metabolic Panel:  Recent Labs  Lab 03/20/18 0820 03/21/18 0704  NA 140 140  K 3.8 4.0  CL 106 105  CO2 24 26  GLUCOSE 122* 96  BUN 25* 27*  CREATININE 1.23* 1.11*  CALCIUM 9.6 9.5    Lipid Panel:     Component Value Date/Time   CHOL 127 03/19/2018 0358   TRIG 52 03/19/2018 0358   HDL 46 03/19/2018 0358   CHOLHDL 2.8 03/19/2018 0358   VLDL 10 03/19/2018 0358   LDLCALC 71 03/19/2018 0358   HgbA1c:  Lab Results  Component Value Date   HGBA1C 5.0 03/19/2018   Urine Drug Screen: No results found for: LABOPIA, COCAINSCRNUR, LABBENZ, AMPHETMU, THCU, LABBARB  Alcohol Level     Component Value Date/Time   [facility] <5 03/23/2017 1635    IMAGING   Mr Brain Wo Contrast 03/18/2018 IMPRESSION:  1. No acute intracranial abnormality.  2. Extensive chronic small vessel ischemic disease with numerous chronic infarcts.   Ct Head Code Stroke Wo Contrast 03/18/2018 IMPRESSION:  1. No evidence of acute intracranial abnormality.  2. ASPECTS is 10.  3. Extensive chronic small vessel ischemic disease with numerous small chronic infarcts.    CTA  Head and Neck 03/21/2018 IMPRESSION:  CT HEAD: 1. No acute intracranial process. 2. Old bifrontal/MCA territory infarct. 3. Severe chronic small vessel ischemic changes. 4. Old cerebellar infarcts.  CTA NECK: 1. Patent RIGHT ICA stent with similar approximate 50% stenosis. No hemodynamically significant stenosis LEFT ICA. 2. Severe stenosis bilateral vertebral arteries.  CTA HEAD: 1. No emergent large vessel occlusion or flow-limiting stenosis. 2. Mild cerebral artery atherosclerosis.  Aortic Atherosclerosis (ICD10-I70.0).   Transthoracic Echocardiogram  03/19/2018 Study Conclusions  - Left ventricle: The cavity size was normal. Wall thickness was   normal. Systolic function was vigorous. The estimated ejection   fraction was in the range of 65% to 70%. Wall motion was normal;   there were no regional wall motion abnormalities. The study is   not technically sufficient to allow evaluation of LV diastolic   function. - Aortic valve: Sclerosis without stenosis. There was trivial   regurgitation. - Mitral valve: Mildly thickened leaflets . There was trivial   regurgitation. - Left atrium: The atrium was normal in size. - Tricuspid valve: There was trivial regurgitation. - Pulmonary arteries: PA peak pressure: 28 mm Hg (S). - Inferior vena cava: The vessel was normal in size. The  respirophasic diameter changes were in the normal range (>= 50%),   consistent with normal central venous pressure.  Impressions:  - LVEF 65-70%, normal wall thickness, normal wall motion, aortic   valve sclerosis with trivial AI, trivial MR, normal LA size,   trivial TR, RVSP 28 mmHg, normal IVC.    Bilateral Carotid Dopplers 03/20/2018 Preliminary notes   Right ICA stent evaluated, possible 50-75% post stent stenosis according to the velocity category of diagnostic criteria;  Left CCA distal findings: Hyperechoic material seen at distal portion of CCA. Left ICA demonstrates up to  40-59% stenosis based on velocity categories, however, Velocities may underestimate degree of stenosis due to more proximal obstruction.  Bilateral vertebral arteries patent with antegrade flow.       PHYSICAL EXAM Vitals:   03/20/18 1733 03/20/18 2016 03/21/18 0540 03/21/18 0744  BP: (!) 154/59 132/88 120/64 (!) 142/53  Pulse: 79 86 88 79  Resp: 16 16 13 17   Temp: 98.2 F (36.8 C) 97.7 F (36.5 C) 97.6 F (36.4 C) 98 F (36.7 C)  TempSrc: Oral Oral Oral Oral  SpO2: 100% 98% 96% 99%  Weight:      Height:        Awake, alert, oriented fully. Language- fluent; comprehension, naming, repetition - intact. Face symmetrical.  Tongue midline. EOMI, PERL. Strength 5/5 BUE and BLE. Coord - intact FTN bilateral. Sensory - normal.    ASSESSMENT/PLAN Ms. Stephanie Harris is a 84 y.o. female with history of TIAs, diabetes mellitus, previous stroke, rheumatoid arthritis, peripheral vascular disease, panic disorder, hypothyroidism, coronary artery disease with MI, leukemia, previous intracerebral hemorrhage, hypertension, headaches, and anxiety presenting with right facial droop, aphasia, and headache. She did not receive IV t-PA due to history of intracerebral hemorrhage  Complicated migraine most likely, although TIA cannot be completely ruled out.    Resultant  Right facial droop and aphasia.  CT head - no acute abnormality - small vessel disease -multiple chronic infarcts  MRI head - no acute abnormality - multiple chronic infarcts  MRA head - not performed  Carotid Doppler - see above preliminary report.  CTA H&N - Multiple old infarcts. Patent RIGHT ICA stent with similar approximate 50% stenosis. Severe stenosis bilateral vertebral arteries.  2D Echo - EF 65 - 70% No cardiac source of emboli identified.  LDL - 71  HgbA1c - 5.0  VTE prophylaxis - Lovenox Diet Order           Diet regular Room service appropriate? Yes; Fluid consistency: Thin  Diet effective now           aspirin 81 mg daily prior to admission, now on aspirin 81 mg daily  Patient counseled to be compliant with her antithrombotic medications  Ongoing aggressive stroke risk factor management  Therapy recommendations: No follow-up therapy recommended  Disposition:  Pending  Hypertension  Stable  Permissive hypertension (OK if < 220/120) but gradually normalize in 5-7 days . Long-term BP goal normotensive  Hyperlipidemia  Lipid lowering medication PTA:  none  LDL 71, goal < 70  Current lipid lowering medication: Lipitor 40 mg daily  Continue statin at discharge  Diabetes  HgbA1c 5.0, goal < 7.0  Controlled  Other Stroke Risk Factors  Advanced age  Hx stroke/TIA  Family hx stroke (mother and brother)  Coronary artery disease   Other Active Problems  Anemia - Hb 9.2  Thrombocytopenia - 126  Creatinine - 1.29 -> 1.23 -> 1.11    Hospital day # 3  ATTENDING NOTE: I reviewed above note and agree with the assessment and plan. I have made any additions or clarifications directly to the above note. Pt was seen and examined.   84 year old female with history of B12 deficiency, carotid disease status post right ICA stent, CKD, CML, CAD/MI status post stent, DM, HTN, HLD, PVD, RA admitted for episode of headache followed by right facial droop and speech difficulty.  She had a stroke in 10/13/2014 and  a TIA in 01/2015.  MRI negative.  Aspirin 81 changed to 325.  Small hemorrhagic infarct in 02/2017.  Currently at home with aspirin 81.  On this admission CT showed no acute abnormality.  MRI showed no acute infarct, but old bilateral BG infarct with small hemorrhagic infarct and bilateral cerebellum small infarcts.  Carotid Doppler showed right ICA stent with 50 to 75% stenosis and left CCA plaque formation and left ICA 40 to 59% stenosis.  However CTA head and neck showed right ICA stent 50% stenosis with but no significant stenosis on the left CCA/ICA.  EF 65 to  70% LDL 71 A1c 5.0.  Patient episode this time likely due to complicated migraine in the setting of home stress.  However TIA cannot be completely ruled out.  Recommend to continue aspirin 81 and add Lipitor 20 for stroke prevention.  Continue stroke risk factor modification.  Patient can be discharged from a stroke standpoint.   Neurology will sign off. Please call with questions. Pt will follow up with stroke clinic NP at [facility] in about 4 weeks. Thanks for the consult.   Rosalin Hawking, MD PhD Stroke Neurology 03/21/2018 9:21 PM         To contact Stroke Continuity provider, please refer to [email or web address] After hours, contact General Neurology
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
## 2018-03-21 NOTE — Progress Notes (Signed)
Patient is anticipating a late discharge tonight. Neurology paged stating resident DO is waiting on complete neurology note before they complete discharge orders. Awaiting response.

## 2018-03-21 NOTE — Progress Notes (Signed)
Patient offered a PRN stool softner or laxative due to not having a DM since 7/25. Patient states she prefers to wait before taking these until after she finds out if she is going home today or not. Will reassess after neurology rounds.

## 2018-03-21 NOTE — Care Management Note (Signed)
Case Management Note  Patient Details  Name: Stephanie Harris MRN: [id number] Date of Birth: December 17, 1930  Subjective/Objective:      Pt in with TIA. She is from home with her spouse and son. She also receives services through [facility] by an Building services engineer. Currently she has an aide 10 am -2pm. She states they are going to increase her services to 4 pm.               Action/Plan: PT/OT with no f/u other than supervision which patient has with family and her aides. Pt states she has friends that will provide transportation home.   Expected Discharge Date:                  Expected Discharge Plan:  Home/Self Care  In-House Referral:     Discharge planning Services  CM Consult  Post Acute Care Choice:    Choice offered to:     DME Arranged:    DME Agency:     HH Arranged:    [place] Agency:     Status of Service:  Completed, signed off  If discussed at [employer] of Stay Meetings, dates discussed:    Additional Comments:  Pollie Friar, RN 03/21/2018, 3:08 PM

## 2018-03-22 ENCOUNTER — Encounter: Payer: Self-pay | Admitting: Family Medicine

## 2018-03-22 DIAGNOSIS — I6503 Occlusion and stenosis of bilateral vertebral arteries: Secondary | ICD-10-CM

## 2018-03-22 LAB — CBC
HCT: 30.8 % — ABNORMAL LOW (ref 36.0–46.0)
Hemoglobin: 9 g/dL — ABNORMAL LOW (ref 12.0–15.0)
MCH: 18.7 pg — ABNORMAL LOW (ref 26.0–34.0)
MCHC: 29.2 g/dL — ABNORMAL LOW (ref 30.0–36.0)
MCV: 63.9 fL — ABNORMAL LOW (ref 78.0–100.0)
Platelets: 116 10*3/uL — ABNORMAL LOW (ref 150–400)
RBC: 4.82 MIL/uL (ref 3.87–5.11)
RDW: 17.3 % — ABNORMAL HIGH (ref 11.5–15.5)
WBC: 6.2 10*3/uL (ref 4.0–10.5)

## 2018-03-22 LAB — BASIC METABOLIC PANEL
Anion gap: 5 (ref 5–15)
BUN: 32 mg/dL — ABNORMAL HIGH (ref 8–23)
CO2: 26 mmol/L (ref 22–32)
Calcium: 9.2 mg/dL (ref 8.9–10.3)
Chloride: 106 mmol/L (ref 98–111)
Creatinine, Ser: 1.28 mg/dL — ABNORMAL HIGH (ref 0.44–1.00)
GFR calc Af Amer: 43 mL/min — ABNORMAL LOW (ref >60)
GFR calc non Af Amer: 37 mL/min — ABNORMAL LOW (ref >60)
Glucose, Bld: 95 mg/dL (ref 70–99)
Potassium: 4.3 mmol/L (ref 3.5–5.1)
Sodium: 137 mmol/L (ref 135–145)

## 2018-03-22 LAB — GLUCOSE, CAPILLARY: Glucose-Capillary: 92 mg/dL (ref 70–99)

## 2018-03-22 NOTE — Progress Notes (Signed)
PT Cancellation Note  Patient Details Name: Stephanie Harris MRN: [id number] DOB: [birth year]   Cancelled Treatment:    Reason Eval/Treat Not Completed: Other (comment). Her husband is arriving to pick her up imminently.   Ramond Dial 03/22/2018, 11:33 AM   Mee Hives, PT MS Acute Rehab Dept. Number: [place] and [place]

## 2018-03-22 NOTE — Progress Notes (Signed)
   Patient has severe vertebral art stenosis B/L. Need PCP referral to Vascular. She stated she prefers to see Vascular surg at [facility]. Her PCP need to place referral on her behalf.   I will forward message to her PCP for Vascular referral.     IMPRESSION: CT HEAD:  1. No acute intracranial process. 2. Old bifrontal/MCA territory infarct. 3. Severe chronic small vessel ischemic changes. 4. Old cerebellar infarcts.  CTA NECK:  1. Patent RIGHT ICA stent with similar approximate 50% stenosis. No hemodynamically significant stenosis LEFT ICA. 2. Severe stenosis bilateral vertebral arteries.  CTA HEAD:  1. No emergent large vessel occlusion or flow-limiting stenosis. 2. Mild cerebral artery atherosclerosis.  Aortic Atherosclerosis (ICD10-I70.0).

## 2018-03-23 ENCOUNTER — Encounter: Payer: Medicare Other | Admitting: Physical Therapy

## 2018-03-25 ENCOUNTER — Telehealth: Payer: Self-pay

## 2018-03-25 DIAGNOSIS — Z8673 Personal history of transient ischemic attack (TIA), and cerebral infarction without residual deficits: Secondary | ICD-10-CM | POA: Diagnosis not present

## 2018-03-25 DIAGNOSIS — G459 Transient cerebral ischemic attack, unspecified: Secondary | ICD-10-CM | POA: Diagnosis not present

## 2018-03-25 DIAGNOSIS — Z7982 Long term (current) use of aspirin: Secondary | ICD-10-CM | POA: Diagnosis not present

## 2018-03-25 DIAGNOSIS — Z8679 Personal history of other diseases of the circulatory system: Secondary | ICD-10-CM | POA: Diagnosis not present

## 2018-03-25 DIAGNOSIS — R55 Syncope and collapse: Secondary | ICD-10-CM | POA: Diagnosis not present

## 2018-03-25 DIAGNOSIS — D631 Anemia in chronic kidney disease: Secondary | ICD-10-CM | POA: Diagnosis not present

## 2018-03-25 DIAGNOSIS — I6521 Occlusion and stenosis of right carotid artery: Secondary | ICD-10-CM | POA: Diagnosis not present

## 2018-03-25 DIAGNOSIS — N183 Chronic kidney disease, stage 3 (moderate): Secondary | ICD-10-CM | POA: Diagnosis not present

## 2018-03-25 NOTE — Telephone Encounter (Signed)
Pt called she went to the hospital for stroke they want [place] to handle Bp med Pt BP is 175/77 as per heather advised pt tot go to er if any symptoms persist of high bp and we scheduled her appt or Monday am./LM

## 2018-03-28 ENCOUNTER — Ambulatory Visit: Payer: Medicare Other | Admitting: Nurse Practitioner

## 2018-03-28 ENCOUNTER — Encounter: Payer: Self-pay | Admitting: Nurse Practitioner

## 2018-03-28 VITALS — BP 172/71 | HR 85 | Resp 16 | Ht 60.0 in | Wt 101.0 lb

## 2018-03-28 DIAGNOSIS — I6523 Occlusion and stenosis of bilateral carotid arteries: Secondary | ICD-10-CM

## 2018-03-28 DIAGNOSIS — I10 Essential (primary) hypertension: Secondary | ICD-10-CM | POA: Diagnosis not present

## 2018-03-28 DIAGNOSIS — I639 Cerebral infarction, unspecified: Secondary | ICD-10-CM

## 2018-03-28 MED ORDER — CARVEDILOL 3.125 MG PO TABS: 3.1250 mg | ORAL_TABLET | Freq: Two times a day (BID) | ORAL | 3 refills | Status: DC

## 2018-03-28 NOTE — Progress Notes (Signed)
[facility] [place], [place] [address]  Internal MEDICINE  Office Visit Note  Patient Name: Stephanie Harris  [id number]  [id number]  Date of Service: 04/05/2018  Pt is here for recent hospital follow up.   Chief Complaint  Patient presents with  . Hypertension  . Hospitalization Follow-up    for stroke     The patient was hospitalized with stroke on 03/11/2018. She spent 5 days in the hospital. MRI showed chronic small infarcts .she also has severe atherosclerosis and stenosis in the carotid and vertebral arteries. She was taken off her blood pressure medications while hospitalized. She has had elevated pressure every since. :ast Friday, she went to the ER at [facility]. ER discharged her without blood pressure medications. She was told that primary care provider should take care of this.     Current Medication: Outpatient Encounter Medications as of 03/28/2018  Medication Sig  . acetaminophen (TYLENOL) 325 MG tablet Take 2 tablets (650 mg total) by mouth every 6 (six) hours as needed for mild pain (or Fever >/= 101).  Marland Kitchen aspirin 81 MG EC tablet Take 81 mg by mouth daily.   Marland Kitchen atorvastatin (LIPITOR) 20 MG tablet Take 1 tablet (20 mg total) by mouth daily at 6 PM.  . cholecalciferol (VITAMIN D) 1000 UNITS tablet Take 1,000 Units by mouth daily.  . cyanocobalamin (,VITAMIN B-12,) 1000 MCG/ML injection Inject 1,000 mcg into the muscle every 30 (thirty) days. At the 1st of the month - GIVING EARLY AT PT REQUEST- PT MISSED THE INJECTION LAST MONTH  . folic acid (FOLVITE) 341 MCG tablet Take 400 mcg by mouth daily.  Marland Kitchen levothyroxine (SYNTHROID, LEVOTHROID) 50 MCG tablet Take 50 mcg by mouth daily.   . polyethylene glycol (MIRALAX / GLYCOLAX) packet Take 17 g by mouth daily as needed.  . vitamin E 400 UNIT capsule Take 400 Units by mouth daily.  Marland Kitchen ascorbic acid (VITAMIN C) 1000 MG tablet Take 1,000 mg by mouth daily.  . [DISCONTINUED] carvedilol (COREG) 3.125 MG tablet  Take 1 tablet (3.125 mg total) by mouth 2 (two) times daily with a meal.   No facility-administered encounter medications on file as of 03/28/2018.     Surgical History: Past Surgical History:  Procedure Laterality Date  . BACK SURGERY    . CAROTID ENDARTERECTOMY Left   . CAROTID STENT INSERTION Right    "have 2 stents in there" (03/18/2018)  . CATARACT EXTRACTION W/ INTRAOCULAR LENS  IMPLANT, BILATERAL Bilateral 06/16/2001 - 05/28/2003   +23.5D     22.5D  . CHOLECYSTECTOMY OPEN  1980  . COLONOSCOPY  2010  . CORONARY ANGIOPLASTY WITH STENT PLACEMENT    . EYELID SURGERY Bilateral 2005   BUL BLEPH  . FRACTURE SURGERY    . JOINT REPLACEMENT    . KYPHOPLASTY N/A 08/12/2017   Procedure: KYPHOPLASTY;  Surgeon: Hessie Knows, MD;  Location: [facility] ORS;  Service: Orthopedics;  Laterality: N/A;  . KYPHOPLASTY N/A 11/08/2017   Procedure: Hewitt Shorts;  Surgeon: Hessie Knows, MD;  Location: [facility] ORS;  Service: Orthopedics;  Laterality: N/A;  . PERCUTANEOUS PLACEMENT INTRAVASCULAR STENT CERVICAL CAROTID ARTERY  06/14/2009  . ROTATOR CUFF REPAIR Left   . TOTAL ABDOMINAL HYSTERECTOMY  1978   WITH REMOVAL TUBES & /OR OVARIES  . TOTAL KNEE ARTHROPLASTY Right     Medical History: Past Medical History:  Diagnosis Date  . Acute bronchiolitis   . Acute pharyngitis   . Allergic rhinitis due to pollen   .  Anemia, unspecified    CKD and LGL  . Anxiety state    unspecified  . Arthritis    "knees, legs" (03/18/2018)  . Asthma without status asthmaticus    unspecified  . B12 deficiency   . Beta thalassemia trait   . Carotid artery occlusion   . Cellulitis and abscess of leg, except foot   . Cervical spondylosis   . Cervicalgia   . Chronic kidney disease   . Chronic lower back pain   . CML (chronic myelocytic leukemia) ([place])    Onc at [facility]  . Complication of anesthesia    "last back surgery they liked to never get me awake" (03/18/2018)  . Coronary artery disease 2009   heart attack with  stent  . Coronary atherosclerosis of native coronary artery   . Depressive disorder    not elsewhere classified  . Difficulty in walking   . Disorder of breast, unspecified   . Dizziness and giddiness   . Dysuria   . Esophageal reflux   . Essential hypertension    unspecified  . Family history of adverse reaction to anesthesia    "liked to never get my sister awake" (03/18/2018)  . Head injury    unspecified  . Headache    "q time I have a stroke" (03/18/2018)  . Heart disease   . Hematuria, unspecified   . History of blood transfusion    "had 22 when they found out I had leukemia" (03/18/2018)  . Hypersomnia, unspecified   . Hypertension   . Hypopotassemia   . Hypothyroidism   . ICH (intracerebral hemorrhage) ([place])   . Ill-defined cerebrovascular disease    other  . Insomnia    unspecified  . 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 Leukemia ([place])   . Lower urinary tract infection   . Lumbago   . Mini stroke ([place])    x years  . Mixed hyperlipidemia   . Myocardial infarction ([place]) ~2017  . Nontoxic nodular goiter    unspecified  . Occlusion and stenosis of unspecified carotid artery    without mention of cerebral infarction  . Osteoarthritis   . Osteoporosis   . Other constipation   . Other vitamin B12 deficiency anemias   . Otitis media, unspecified, unspecified ear   . Ovarian failure    unspecified  . Pain in limb   . Panic disorder without agoraphobia   . Peripheral vascular disease ([place])    unspecified  . Phlebitis of left arm   . RA (rheumatoid arthritis) ([place])   . Sigmoid polyp 1998  . Sleep disturbance    unspecified  . Stroke ([place]) 02/2017   "lots of mini strokes; big one 02/2017; that one made me weak in my knees,; never fully recovered" (03/18/2018)  . Syncope and collapse   . Thalassemia   . TIA (transient ischemic attack)    2000 and 2008 right carotid stent 08/14/2009 on Plavix  . TIA (transient ischemic attack) 03/18/2018  .  Transient disorder of initiating or maintaining sleep   . Type II diabetes mellitus ([place])   . Varicose veins of bilateral lower extremities with other complications   . Varicose veins of bilateral lower extremities with other complications   . Vision abnormalities     Family History: Family History  Problem Relation Age of Onset  . Acute myelogenous leukemia Brother   . Anemia Brother   . Coronary artery disease Brother   . Heart disease Brother   .  Stroke Brother   . Heart failure Brother   . Stroke Mother   . Anemia Mother   . Heart disease Mother   . Heart failure Mother   . Hypertension Mother   . Osteoarthritis Mother   . Rheum arthritis Mother   . Heart attack Father   . Anemia Sister   . Cataracts Sister   . Osteoarthritis Sister   . Rheum arthritis Sister   . Stroke Sister   . Heart disease Sister   . Thalassemia Sister   . Thalassemia Sister   . Blindness Neg Hx   . Glaucoma Neg Hx   . Macular degeneration Neg Hx   . Strabismus Neg Hx   . Vision loss Neg Hx   . Basal cell carcinoma Neg Hx   . GU problems Neg Hx   . Kidney cancer Neg Hx   . Melanoma Neg Hx   . Kidney disease Neg Hx   . Prostate cancer Neg Hx   . Squamous cell carcinoma Neg Hx     Social History   Socioeconomic History  . Marital status: Married    Spouse name: Not on file  . Number of children: Not on file  . Years of education: Not on file  . Highest education level: Not on file  Occupational History  . Not on file  Social Needs  . Financial resource strain: Not on file  . Food insecurity:    Worry: Not on file    Inability: Not on file  . Transportation needs:    Medical: Not on file    Non-medical: Not on file  Tobacco Use  . Smoking status: Never Smoker  . Smokeless tobacco: Never Used  Substance and Sexual Activity  . Alcohol use: No  . Drug use: No  . Sexual activity: Never  Lifestyle  . Physical activity:    Days per week: Not on file    Minutes per session: Not  on file  . Stress: Not on file  Relationships  . Social connections:    Talks on phone: Not on file    Gets together: Not on file    Attends religious service: Not on file    Active member of club or organization: Not on file    Attends meetings of clubs or organizations: Not on file    Relationship status: Not on file  . Intimate partner violence:    Fear of current or ex partner: Not on file    Emotionally abused: Not on file    Physically abused: Not on file    Forced sexual activity: Not on file  Other Topics Concern  . Not on file  Social History Narrative  . Not on file      Review of Systems  Constitutional: Positive for activity change, appetite change and fatigue.  HENT: Negative for congestion, hearing loss, sinus pain and tinnitus.   Eyes: Negative.   Respiratory: Negative for shortness of breath and wheezing.   Cardiovascular: Negative for chest pain, palpitations and leg swelling.  Gastrointestinal: Negative for constipation, diarrhea, nausea and vomiting.  Endocrine: Negative for cold intolerance, heat intolerance, polydipsia, polyphagia and polyuria.  Genitourinary: Negative.   Musculoskeletal: Positive for arthralgias and back pain.  Skin: Negative for rash.  Allergic/Immunologic: Negative for environmental allergies.  Neurological: Positive for dizziness, weakness and headaches.  Hematological: Negative for adenopathy.  Psychiatric/Behavioral: Positive for dysphoric mood. The patient is nervous/anxious.     Today's Vitals   03/28/18 1122  BP: (!) 172/71  Pulse: 85  Resp: 16  SpO2: 98%  Weight: 101 lb (45.8 kg)  Height: 5' (1.524 m)   Physical Exam  Constitutional: She is oriented to person, place, and time.  Looks weak and frail   HENT:  Head: Normocephalic and atraumatic.  Nose: Nose normal.  Eyes: Pupils are equal, round, and reactive to light. Conjunctivae and EOM are normal.  Neck: Neck supple. Spinous process tenderness and muscular  tenderness present. Decreased range of motion present.  Cardiovascular: Normal rate, regular rhythm and normal heart sounds.  Pulmonary/Chest: Effort normal and breath sounds normal. She has no wheezes.  Musculoskeletal: She exhibits deformity. She exhibits no tenderness.  Patient using a walker to help with mobility.  Neurological: She is alert and oriented to person, place, and time. No cranial nerve deficit.  Skin: Skin is warm and dry. Capillary refill takes 2 to 3 seconds.  Psychiatric: Her speech is normal and behavior is normal. Thought content normal. Her mood appears anxious. Cognition and memory are normal.  Nursing note and vitals reviewed.  Assessment/Plan:  1. Cerebrovascular accident (CVA), unspecified mechanism ([place]) Recent hospitalization for stroke. Reviewed records, progress notes, labs, and imaging studies done while hospitalized. CT and MRI of the brain showed no acute infarct but does show significant small vessel disease with numerous small and chronic infarcts.   2. Essential hypertension, benign Restart coreg 3.125mg  twice daily. Monitor closely. Follow up in 1 to 2 weeks for continued evaluation.   3. Carotid artery calcification, bilateral CT neck showing severe stenosis in bilateral vertebral arteries. Also patent stent in right carotid artery with <50% stenosis. Left carotid artery has moderate plaque without significant stenosis. She will be followed by vein and vascular.   General Counseling: Katrine verbalizes understanding of the findings of todays visit and agrees with plan of treatment. I have discussed any further diagnostic evaluation that may be needed or ordered today. We also reviewed her medications today. she has been encouraged to call the office with any questions or concerns that should arise related to todays visit.    Counseling:   Hypertension Counseling:   The following hypertensive lifestyle modification were recommended and discussed:  1.  Limiting alcohol intake to less than 1 oz/day of ethanol:(24 oz of beer or 8 oz of wine or 2 oz of 100-proof whiskey). 2. Take baby ASA 81 mg daily. 3. Importance of regular aerobic exercise and losing weight. 4. Reduce dietary saturated fat and cholesterol intake for overall cardiovascular health. 5. Maintaining adequate dietary potassium, calcium, and magnesium intake. 6. Regular monitoring of the blood pressure. 7. Reduce sodium intake to less than 100 mmol/day (less than 2.3 gm of sodium or less than 6 gm of sodium choride)   This patient was seen by [facility] with Dr [name] as a part of collaborative care agreement    I have reviewed all medical records from hospital follow up including radiology reports and consults from other physicians. Appropriate follow up diagnostics will be scheduled as needed. Patient/ Family understands the plan of treatment. Time spent 25 minutes.   Dr [name], MD Internal Medicine

## 2018-03-29 ENCOUNTER — Other Ambulatory Visit: Payer: Self-pay

## 2018-03-29 ENCOUNTER — Encounter: Payer: Medicare Other | Admitting: Physical Therapy

## 2018-03-29 DIAGNOSIS — I10 Essential (primary) hypertension: Secondary | ICD-10-CM

## 2018-03-29 MED ORDER — CARVEDILOL 3.125 MG PO TABS: 3.1250 mg | ORAL_TABLET | Freq: Two times a day (BID) | ORAL | 1 refills | Status: DC

## 2018-03-30 DIAGNOSIS — M1712 Unilateral primary osteoarthritis, left knee: Secondary | ICD-10-CM | POA: Diagnosis not present

## 2018-03-30 DIAGNOSIS — M19012 Primary osteoarthritis, left shoulder: Secondary | ICD-10-CM | POA: Diagnosis not present

## 2018-03-31 ENCOUNTER — Encounter: Payer: Medicare Other | Admitting: Physical Therapy

## 2018-04-01 DIAGNOSIS — G459 Transient cerebral ischemic attack, unspecified: Secondary | ICD-10-CM | POA: Diagnosis not present

## 2018-04-05 DIAGNOSIS — I10 Essential (primary) hypertension: Secondary | ICD-10-CM | POA: Insufficient documentation

## 2018-04-05 DIAGNOSIS — I639 Cerebral infarction, unspecified: Secondary | ICD-10-CM | POA: Insufficient documentation

## 2018-04-05 DIAGNOSIS — I6523 Occlusion and stenosis of bilateral carotid arteries: Secondary | ICD-10-CM | POA: Insufficient documentation

## 2018-04-06 ENCOUNTER — Ambulatory Visit: Payer: Medicare Other | Admitting: Nurse Practitioner

## 2018-04-06 ENCOUNTER — Encounter: Payer: Self-pay | Admitting: Nurse Practitioner

## 2018-04-06 VITALS — BP 168/72 | HR 66 | Resp 16 | Ht 60.0 in | Wt 104.2 lb

## 2018-04-06 DIAGNOSIS — M79671 Pain in right foot: Secondary | ICD-10-CM | POA: Insufficient documentation

## 2018-04-06 DIAGNOSIS — I639 Cerebral infarction, unspecified: Secondary | ICD-10-CM | POA: Diagnosis not present

## 2018-04-06 DIAGNOSIS — I10 Essential (primary) hypertension: Secondary | ICD-10-CM

## 2018-04-06 MED ORDER — LISINOPRIL 5 MG PO TABS: 5.0000 mg | ORAL_TABLET | Freq: Every day | ORAL | 3 refills | Status: DC

## 2018-04-06 NOTE — Progress Notes (Signed)
[facility] [place], [place] [address]  Internal MEDICINE  Office Visit Note  Patient Name: Stephanie Harris  [id number]  [id number]  Date of Service: 04/06/2018  Chief Complaint  Patient presents with  . Medical Management of Chronic Issues    restart blood pressure medication    Restarted patient on coreg 3.125mg  twice daily at her last visit. Blood pressure improved but still very high. She feels tired, but has not had other negative side effects .      Current Medication: Outpatient Encounter Medications as of 04/06/2018  Medication Sig  . acetaminophen (TYLENOL) 325 MG tablet Take 2 tablets (650 mg total) by mouth every 6 (six) hours as needed for mild pain (or Fever >/= 101).  Marland Kitchen ascorbic acid (VITAMIN C) 1000 MG tablet Take 1,000 mg by mouth daily.  Marland Kitchen aspirin 81 MG EC tablet Take 81 mg by mouth daily.   Marland Kitchen atorvastatin (LIPITOR) 20 MG tablet Take 1 tablet (20 mg total) by mouth daily at 6 PM.  . carvedilol (COREG) 3.125 MG tablet Take 1 tablet (3.125 mg total) by mouth 2 (two) times daily with a meal.  . cholecalciferol (VITAMIN D) 1000 UNITS tablet Take 1,000 Units by mouth daily.  . cyanocobalamin (,VITAMIN B-12,) 1000 MCG/ML injection Inject 1,000 mcg into the muscle every 30 (thirty) days. At the 1st of the month - GIVING EARLY AT PT REQUEST- PT MISSED THE INJECTION LAST MONTH  . folic acid (FOLVITE) 063 MCG tablet Take 400 mcg by mouth daily.  Marland Kitchen levothyroxine (SYNTHROID, LEVOTHROID) 50 MCG tablet Take 50 mcg by mouth daily.   . polyethylene glycol (MIRALAX / GLYCOLAX) packet Take 17 g by mouth daily as needed.  . vitamin E 400 UNIT capsule Take 400 Units by mouth daily.  Marland Kitchen lisinopril (PRINIVIL,ZESTRIL) 5 MG tablet Take 1 tablet (5 mg total) by mouth daily.   No facility-administered encounter medications on file as of 04/06/2018.     Surgical History: Past Surgical History:  Procedure Laterality Date  . BACK SURGERY    . CAROTID  ENDARTERECTOMY Left   . CAROTID STENT INSERTION Right    "have 2 stents in there" (03/18/2018)  . CATARACT EXTRACTION W/ INTRAOCULAR LENS  IMPLANT, BILATERAL Bilateral 06/16/2001 - 05/28/2003   +23.5D     22.5D  . CHOLECYSTECTOMY OPEN  1980  . COLONOSCOPY  2010  . CORONARY ANGIOPLASTY WITH STENT PLACEMENT    . EYELID SURGERY Bilateral 2005   BUL BLEPH  . FRACTURE SURGERY    . JOINT REPLACEMENT    . KYPHOPLASTY N/A 08/12/2017   Procedure: KYPHOPLASTY;  Surgeon: Hessie Knows, MD;  Location: [facility] ORS;  Service: Orthopedics;  Laterality: N/A;  . KYPHOPLASTY N/A 11/08/2017   Procedure: Hewitt Shorts;  Surgeon: Hessie Knows, MD;  Location: [facility] ORS;  Service: Orthopedics;  Laterality: N/A;  . PERCUTANEOUS PLACEMENT INTRAVASCULAR STENT CERVICAL CAROTID ARTERY  06/14/2009  . ROTATOR CUFF REPAIR Left   . TOTAL ABDOMINAL HYSTERECTOMY  1978   WITH REMOVAL TUBES & /OR OVARIES  . TOTAL KNEE ARTHROPLASTY Right     Medical History: Past Medical History:  Diagnosis Date  . Acute bronchiolitis   . Acute pharyngitis   . Allergic rhinitis due to pollen   . Anemia, unspecified    CKD and LGL  . Anxiety state    unspecified  . Arthritis    "knees, legs" (03/18/2018)  . Asthma without status asthmaticus    unspecified  . B12 deficiency   .  Beta thalassemia trait   . Carotid artery occlusion   . Cellulitis and abscess of leg, except foot   . Cervical spondylosis   . Cervicalgia   . Chronic kidney disease   . Chronic lower back pain   . CML (chronic myelocytic leukemia) ([place])    Onc at [facility]  . Complication of anesthesia    "last back surgery they liked to never get me awake" (03/18/2018)  . Coronary artery disease 2009   heart attack with stent  . Coronary atherosclerosis of native coronary artery   . Depressive disorder    not elsewhere classified  . Difficulty in walking   . Disorder of breast, unspecified   . Dizziness and giddiness   . Dysuria   . Esophageal reflux   . Essential  hypertension    unspecified  . Family history of adverse reaction to anesthesia    "liked to never get my sister awake" (03/18/2018)  . Head injury    unspecified  . Headache    "q time I have a stroke" (03/18/2018)  . Heart disease   . Hematuria, unspecified   . History of blood transfusion    "had 22 when they found out I had leukemia" (03/18/2018)  . Hypersomnia, unspecified   . Hypertension   . Hypopotassemia   . Hypothyroidism   . ICH (intracerebral hemorrhage) ([place])   . Ill-defined cerebrovascular disease    other  . Insomnia    unspecified  . 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 Leukemia ([place])   . Lower urinary tract infection   . Lumbago   . Mini stroke ([place])    x years  . Mixed hyperlipidemia   . Myocardial infarction ([place]) ~2017  . Nontoxic nodular goiter    unspecified  . Occlusion and stenosis of unspecified carotid artery    without mention of cerebral infarction  . Osteoarthritis   . Osteoporosis   . Other constipation   . Other vitamin B12 deficiency anemias   . Otitis media, unspecified, unspecified ear   . Ovarian failure    unspecified  . Pain in limb   . Panic disorder without agoraphobia   . Peripheral vascular disease ([place])    unspecified  . Phlebitis of left arm   . RA (rheumatoid arthritis) ([place])   . Sigmoid polyp 1998  . Sleep disturbance    unspecified  . Stroke ([place]) 02/2017   "lots of mini strokes; big one 02/2017; that one made me weak in my knees,; never fully recovered" (03/18/2018)  . Syncope and collapse   . Thalassemia   . TIA (transient ischemic attack)    2000 and 2008 right carotid stent 08/14/2009 on Plavix  . TIA (transient ischemic attack) 03/18/2018  . Transient disorder of initiating or maintaining sleep   . Type II diabetes mellitus ([place])   . Varicose veins of bilateral lower extremities with other complications   . Varicose veins of bilateral lower extremities with other complications   . Vision  abnormalities     Family History: Family History  Problem Relation Age of Onset  . Acute myelogenous leukemia Brother   . Anemia Brother   . Coronary artery disease Brother   . Heart disease Brother   . Stroke Brother   . Heart failure Brother   . Stroke Mother   . Anemia Mother   . Heart disease Mother   . Heart failure Mother   . Hypertension Mother   . Osteoarthritis Mother   .  Rheum arthritis Mother   . Heart attack Father   . Anemia Sister   . Cataracts Sister   . Osteoarthritis Sister   . Rheum arthritis Sister   . Stroke Sister   . Heart disease Sister   . Thalassemia Sister   . Thalassemia Sister   . Blindness Neg Hx   . Glaucoma Neg Hx   . Macular degeneration Neg Hx   . Strabismus Neg Hx   . Vision loss Neg Hx   . Basal cell carcinoma Neg Hx   . GU problems Neg Hx   . Kidney cancer Neg Hx   . Melanoma Neg Hx   . Kidney disease Neg Hx   . Prostate cancer Neg Hx   . Squamous cell carcinoma Neg Hx     Social History   Socioeconomic History  . Marital status: Married    Spouse name: Not on file  . Number of children: Not on file  . Years of education: Not on file  . Highest education level: Not on file  Occupational History  . Not on file  Social Needs  . Financial resource strain: Not on file  . Food insecurity:    Worry: Not on file    Inability: Not on file  . Transportation needs:    Medical: Not on file    Non-medical: Not on file  Tobacco Use  . Smoking status: Never Smoker  . Smokeless tobacco: Never Used  Substance and Sexual Activity  . Alcohol use: No  . Drug use: No  . Sexual activity: Never  Lifestyle  . Physical activity:    Days per week: Not on file    Minutes per session: Not on file  . Stress: Not on file  Relationships  . Social connections:    Talks on phone: Not on file    Gets together: Not on file    Attends religious service: Not on file    Active member of club or organization: Not on file    Attends meetings  of clubs or organizations: Not on file    Relationship status: Not on file  . Intimate partner violence:    Fear of current or ex partner: Not on file    Emotionally abused: Not on file    Physically abused: Not on file    Forced sexual activity: Not on file  Other Topics Concern  . Not on file  Social History Narrative  . Not on file      Review of Systems  Constitutional: Positive for activity change, appetite change and fatigue.  HENT: Negative for congestion, hearing loss, sinus pain and tinnitus.   Eyes: Negative.   Respiratory: Negative for shortness of breath and wheezing.   Cardiovascular: Negative for chest pain, palpitations and leg swelling.  Gastrointestinal: Negative for constipation, diarrhea, nausea and vomiting.  Endocrine: Negative for cold intolerance, heat intolerance, polydipsia, polyphagia and polyuria.  Genitourinary: Negative.   Musculoskeletal: Positive for arthralgias and back pain.  Skin: Negative for rash.  Allergic/Immunologic: Negative for environmental allergies.  Neurological: Positive for weakness and headaches. Negative for dizziness.  Hematological: Negative for adenopathy.  Psychiatric/Behavioral: Positive for dysphoric mood. The patient is nervous/anxious.     Today's Vitals   04/06/18 1148  BP: (!) 168/72  Pulse: 66  Resp: 16  SpO2: 100%  Weight: 104 lb 3.2 oz (47.3 kg)  Height: 5' (1.524 m)   Physical Exam  Constitutional: She is oriented to person, place, and time.  Looks  weak and frail   HENT:  Head: Normocephalic and atraumatic.  Nose: Nose normal.  Eyes: Pupils are equal, round, and reactive to light. Conjunctivae and EOM are normal.  Neck: Neck supple. Spinous process tenderness and muscular tenderness present. Decreased range of motion present.  Cardiovascular: Normal rate, regular rhythm and normal heart sounds.  Pulmonary/Chest: Effort normal and breath sounds normal. She has no wheezes.  Musculoskeletal: She exhibits no  tenderness or deformity.  Patient using a walker to help with mobility. She has moderate bruising covering most of the dorsal surface of her right foot. No selling. Mild tenderness.   Neurological: She is alert and oriented to person, place, and time. No cranial nerve deficit.  Skin: Skin is warm and dry. Capillary refill takes 2 to 3 seconds.  Psychiatric: Her speech is normal and behavior is normal. Judgment and thought content normal. Her mood appears anxious. Cognition and memory are normal.  Nursing note and vitals reviewed.   Assessment/Plan:  1. Essential hypertension, benign Improved but still very elevated. Add lisinopril 5mg  daily. Continue coreg 3.125mg  twice daily.  - lisinopril (PRINIVIL,ZESTRIL) 5 MG tablet; Take 1 tablet (5 mg total) by mouth daily.  Dispense: 90 tablet; Refill: 3  2. Cerebrovascular accident (CVA), unspecified mechanism ([place]) Stable.   3. Pain in right foot Moderate bruising. Ice and rest foot when possible. Monitor closely.   General Counseling: S[place]oney verbalizes understanding of the findings of todays visit and agrees with plan of treatment. I have discussed any further diagnostic evaluation that may be needed or ordered today. We also reviewed her medications today. she has been encouraged to call the office with any questions or concerns that should arise related to todays visit.   Hypertension Counseling:   The following hypertensive lifestyle modification were recommended and discussed:  1. Limiting alcohol intake to less than 1 oz/day of ethanol:(24 oz of beer or 8 oz of wine or 2 oz of 100-proof whiskey). 2. Take baby ASA 81 mg daily. 3. Importance of regular aerobic exercise and losing weight. 4. Reduce dietary saturated fat and cholesterol intake for overall cardiovascular health. 5. Maintaining adequate dietary potassium, calcium, and magnesium intake. 6. Regular monitoring of the blood pressure. 7. Reduce sodium intake to less than 100  mmol/day (less than 2.3 gm of sodium or less than 6 gm of sodium choride)   This patient was seen by [place] with Dr [name] as a part of collaborative care agreement  Meds ordered this encounter  Medications  . lisinopril (PRINIVIL,ZESTRIL) 5 MG tablet    Sig: Take 1 tablet (5 mg total) by mouth daily.    Dispense:  90 tablet    Refill:  3    Order Specific Question:   Supervising Provider    Answer:   [name] [[id number]]    Time spent: 22 Minutes      Dr [name] Internal medicine

## 2018-04-07 DIAGNOSIS — D631 Anemia in chronic kidney disease: Secondary | ICD-10-CM | POA: Diagnosis not present

## 2018-04-07 DIAGNOSIS — N183 Chronic kidney disease, stage 3 (moderate): Secondary | ICD-10-CM | POA: Diagnosis not present

## 2018-04-07 DIAGNOSIS — D72820 Lymphocytosis (symptomatic): Secondary | ICD-10-CM | POA: Diagnosis not present

## 2018-04-22 DIAGNOSIS — G459 Transient cerebral ischemic attack, unspecified: Secondary | ICD-10-CM | POA: Diagnosis not present

## 2018-04-26 ENCOUNTER — Telehealth: Payer: Self-pay

## 2018-04-26 DIAGNOSIS — N183 Chronic kidney disease, stage 3 (moderate): Secondary | ICD-10-CM | POA: Diagnosis not present

## 2018-04-26 DIAGNOSIS — D631 Anemia in chronic kidney disease: Secondary | ICD-10-CM | POA: Diagnosis not present

## 2018-04-26 NOTE — Telephone Encounter (Signed)
Faxed pres for underpads to elder care [phone]

## 2018-04-27 ENCOUNTER — Telehealth: Payer: Self-pay | Admitting: Nurse Practitioner
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
## 2018-05-05 DIAGNOSIS — D631 Anemia in chronic kidney disease: Secondary | ICD-10-CM | POA: Diagnosis not present

## 2018-05-05 DIAGNOSIS — D72820 Lymphocytosis (symptomatic): Secondary | ICD-10-CM | POA: Diagnosis not present

## 2018-05-05 DIAGNOSIS — N183 Chronic kidney disease, stage 3 (moderate): Secondary | ICD-10-CM | POA: Diagnosis not present

## 2018-05-06 ENCOUNTER — Ambulatory Visit (INDEPENDENT_AMBULATORY_CARE_PROVIDER_SITE_OTHER): Payer: Medicare Other | Admitting: Nurse Practitioner

## 2018-05-06 ENCOUNTER — Encounter: Payer: Self-pay | Admitting: Nurse Practitioner

## 2018-05-06 VITALS — BP 169/58 | HR 68 | Resp 16 | Ht 60.0 in | Wt 101.8 lb

## 2018-05-06 DIAGNOSIS — C9110 Chronic lymphocytic leukemia of B-cell type not having achieved remission: Secondary | ICD-10-CM

## 2018-05-06 DIAGNOSIS — I10 Essential (primary) hypertension: Secondary | ICD-10-CM

## 2018-05-06 DIAGNOSIS — I639 Cerebral infarction, unspecified: Secondary | ICD-10-CM | POA: Diagnosis not present

## 2018-05-06 DIAGNOSIS — Z9189 Other specified personal risk factors, not elsewhere classified: Secondary | ICD-10-CM
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
## 2018-05-19 DIAGNOSIS — N183 Chronic kidney disease, stage 3 (moderate): Secondary | ICD-10-CM | POA: Diagnosis not present

## 2018-05-19 DIAGNOSIS — D631 Anemia in chronic kidney disease: Secondary | ICD-10-CM | POA: Diagnosis not present

## 2018-05-26 ENCOUNTER — Other Ambulatory Visit: Payer: Self-pay

## 2018-05-26 ENCOUNTER — Emergency Department: Payer: Medicare Other

## 2018-05-26 ENCOUNTER — Emergency Department
Admission: EM | Admit: 2018-05-26 | Discharge: 2018-05-26 | Disposition: A | Discharge status: Home / Self Care | Payer: Medicare Other | Attending: Emergency Medicine | Admitting: Emergency Medicine

## 2018-05-26 DIAGNOSIS — S0003XA Contusion of scalp, initial encounter: Secondary | ICD-10-CM | POA: Diagnosis not present

## 2018-05-26 DIAGNOSIS — S299XXA Unspecified injury of thorax, initial encounter: Secondary | ICD-10-CM | POA: Diagnosis not present

## 2018-05-26 DIAGNOSIS — E1122 Type 2 diabetes mellitus with diabetic chronic kidney disease: Secondary | ICD-10-CM | POA: Insufficient documentation

## 2018-05-26 DIAGNOSIS — Y939 Activity, unspecified: Secondary | ICD-10-CM | POA: Diagnosis not present

## 2018-05-26 DIAGNOSIS — S0990XA Unspecified injury of head, initial encounter: Secondary | ICD-10-CM | POA: Diagnosis not present

## 2018-05-26 DIAGNOSIS — Y92512 Supermarket, store or market as the place of occurrence of the external cause: Secondary | ICD-10-CM | POA: Diagnosis not present

## 2018-05-26 DIAGNOSIS — E039 Hypothyroidism, unspecified: Secondary | ICD-10-CM | POA: Diagnosis not present

## 2018-05-26 DIAGNOSIS — Z96651 Presence of right artificial knee joint: Secondary | ICD-10-CM | POA: Insufficient documentation

## 2018-05-26 DIAGNOSIS — S99922A Unspecified injury of left foot, initial encounter: Secondary | ICD-10-CM | POA: Diagnosis not present

## 2018-05-26 DIAGNOSIS — M542 Cervicalgia: Secondary | ICD-10-CM | POA: Insufficient documentation

## 2018-05-26 DIAGNOSIS — M79672 Pain in left foot: Secondary | ICD-10-CM | POA: Insufficient documentation

## 2018-05-26 DIAGNOSIS — R110 Nausea: Secondary | ICD-10-CM | POA: Insufficient documentation

## 2018-05-26 DIAGNOSIS — S3992XA Unspecified injury of lower back, initial encounter: Secondary | ICD-10-CM | POA: Diagnosis not present

## 2018-05-26 DIAGNOSIS — I2510 Atherosclerotic heart disease of native coronary artery without angina pectoris: Secondary | ICD-10-CM | POA: Diagnosis not present

## 2018-05-26 DIAGNOSIS — R21 Rash and other nonspecific skin eruption: Secondary | ICD-10-CM | POA: Diagnosis not present

## 2018-05-26 DIAGNOSIS — Z8673 Personal history of transient ischemic attack (TIA), and cerebral infarction without residual deficits: Secondary | ICD-10-CM | POA: Diagnosis not present

## 2018-05-26 DIAGNOSIS — R51 Headache: Secondary | ICD-10-CM | POA: Diagnosis not present

## 2018-05-26 DIAGNOSIS — H538 Other visual disturbances: Secondary | ICD-10-CM | POA: Diagnosis not present

## 2018-05-26 DIAGNOSIS — M546 Pain in thoracic spine: Secondary | ICD-10-CM | POA: Diagnosis not present

## 2018-05-26 DIAGNOSIS — I252 Old myocardial infarction: Secondary | ICD-10-CM | POA: Diagnosis not present

## 2018-05-26 DIAGNOSIS — I129 Hypertensive chronic kidney disease with stage 1 through stage 4 chronic kidney disease, or unspecified chronic kidney disease: Secondary | ICD-10-CM | POA: Insufficient documentation

## 2018-05-26 DIAGNOSIS — W010XXA Fall on same level from slipping, tripping and stumbling without subsequent striking against object, initial encounter: Secondary | ICD-10-CM | POA: Diagnosis not present

## 2018-05-26 DIAGNOSIS — S199XXA Unspecified injury of neck, initial encounter: Secondary | ICD-10-CM | POA: Diagnosis not present

## 2018-05-26 DIAGNOSIS — W19XXXA Unspecified fall, initial encounter: Secondary | ICD-10-CM | POA: Diagnosis not present

## 2018-05-26 DIAGNOSIS — N189 Chronic kidney disease, unspecified: Secondary | ICD-10-CM | POA: Insufficient documentation

## 2018-05-26 DIAGNOSIS — Y999 Unspecified external cause status: Secondary | ICD-10-CM | POA: Insufficient documentation

## 2018-05-26 DIAGNOSIS — Z91040 Latex allergy status: Secondary | ICD-10-CM | POA: Insufficient documentation

## 2018-05-26 DIAGNOSIS — M545 Low back pain, unspecified: Secondary | ICD-10-CM

## 2018-05-26 DIAGNOSIS — I10 Essential (primary) hypertension: Secondary | ICD-10-CM | POA: Diagnosis not present

## 2018-05-26 LAB — URINALYSIS, COMPLETE (UACMP) WITH MICROSCOPIC
Bacteria, UA: NONE SEEN
Bilirubin Urine: NEGATIVE
Glucose, UA: NEGATIVE mg/dL
Ketones, ur: NEGATIVE mg/dL
Leukocytes, UA: NEGATIVE
Nitrite: NEGATIVE
Protein, ur: NEGATIVE mg/dL
Specific Gravity, Urine: 1.006 (ref 1.005–1.030)
WBC, UA: NONE SEEN WBC/hpf (ref 0–5)
pH: 6 (ref 5.0–8.0)

## 2018-05-26 LAB — CBC
HCT: 33.8 % — ABNORMAL LOW (ref 35.0–47.0)
Hemoglobin: 10.6 g/dL — ABNORMAL LOW (ref 12.0–16.0)
MCH: 20.2 pg — ABNORMAL LOW (ref 26.0–34.0)
MCHC: 31.6 g/dL — ABNORMAL LOW (ref 32.0–36.0)
MCV: 64 fL — ABNORMAL LOW (ref 80.0–100.0)
Platelets: 138 10*3/uL — ABNORMAL LOW (ref 150–440)
RBC: 5.27 MIL/uL — ABNORMAL HIGH (ref 3.80–5.20)
RDW: 18.2 % — ABNORMAL HIGH (ref 11.5–14.5)
WBC: 9.9 10*3/uL (ref 3.6–11.0)

## 2018-05-26 LAB — COMPREHENSIVE METABOLIC PANEL
ALT: 20 U/L (ref 0–44)
AST: 25 U/L (ref 15–41)
Albumin: 4.2 g/dL (ref 3.5–5.0)
Alkaline Phosphatase: 26 U/L — ABNORMAL LOW (ref 38–126)
Anion gap: 9 (ref 5–15)
BUN: 19 mg/dL (ref 8–23)
CO2: 27 mmol/L (ref 22–32)
Calcium: 10.2 mg/dL (ref 8.9–10.3)
Chloride: 105 mmol/L (ref 98–111)
Creatinine, Ser: 1.1 mg/dL — ABNORMAL HIGH (ref 0.44–1.00)
GFR calc Af Amer: 52 mL/min — ABNORMAL LOW (ref >60)
GFR calc non Af Amer: 44 mL/min — ABNORMAL LOW (ref >60)
Glucose, Bld: 118 mg/dL — ABNORMAL HIGH (ref 70–99)
Potassium: 3.8 mmol/L (ref 3.5–5.1)
Sodium: 141 mmol/L (ref 135–145)
Total Bilirubin: 2.6 mg/dL — ABNORMAL HIGH (ref 0.3–1.2)
Total Protein: 7.7 g/dL (ref 6.5–8.1)

## 2018-05-26 LAB — PROTIME-INR
INR: 1.02
Prothrombin Time: 13.3 seconds (ref 11.4–15.2)

## 2018-05-26 LAB — APTT: aPTT: 29 seconds (ref 24–36)

## 2018-05-26 MED ORDER — HYDROXYZINE HCL 10 MG PO TABS
10.0000 mg | ORAL_TABLET | Freq: Once | ORAL | Status: AC
  Administered 2018-05-26: 10 mg via ORAL
  Filled 2018-05-26: qty 1

## 2018-05-26 MED ORDER — ACETAMINOPHEN 325 MG PO TABS
650.0000 mg | ORAL_TABLET | Freq: Once | ORAL | Status: AC
  Administered 2018-05-26: 650 mg via ORAL
  Filled 2018-05-26: qty 2

## 2018-05-26 MED ORDER — SODIUM CHLORIDE 0.9 % IV BOLUS
500.0000 mL | Freq: Once | INTRAVENOUS | Status: AC
  Administered 2018-05-26: 500 mL via INTRAVENOUS

## 2018-05-26 MED ORDER — ONDANSETRON 4 MG PO TBDP: 4.0000 mg | ORAL_TABLET | Freq: Three times a day (TID) | ORAL | 0 refills | Status: DC | PRN

## 2018-05-26 MED ORDER — HYDROXYZINE HCL 10 MG PO TABS: 10.0000 mg | ORAL_TABLET | Freq: Three times a day (TID) | ORAL | 0 refills | Status: DC | PRN

## 2018-05-26 MED ORDER — ONDANSETRON HCL 4 MG/2ML IJ SOLN
4.0000 mg | Freq: Once | INTRAMUSCULAR | Status: AC
  Administered 2018-05-26: 4 mg via INTRAVENOUS
  Filled 2018-05-26: qty 2
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
## 2018-05-26 NOTE — Discharge Instructions (Addendum)
For your pain, you may take Tylenol or Motrin, and use either an ice pack or heating pack to help with your pain.  You may apply the ice pack or heat pack for 15 minutes every 2 hours.  Please take all precautions to prevent falls.  Make a follow-up appoint with your primary care physician for further evaluation.  Stop taking Benadryl as this is not helping; you may take Atarax for your itching but please talk to your primary care physician about the itching on your arms with the rash that you have.  Return to the emergency department if you develop severe pain, lightheadedness or fainting, vomiting, or any other symptoms concerning to you.
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
## 2018-05-30 ENCOUNTER — Encounter: Payer: Self-pay | Admitting: Adult Health

## 2018-05-30 ENCOUNTER — Ambulatory Visit (INDEPENDENT_AMBULATORY_CARE_PROVIDER_SITE_OTHER): Payer: Medicare Other | Admitting: Adult Health

## 2018-05-30 VITALS — BP 130/62 | HR 82 | Resp 16 | Ht 60.0 in | Wt 101.0 lb

## 2018-05-30 DIAGNOSIS — E119 Type 2 diabetes mellitus without complications: Secondary | ICD-10-CM | POA: Diagnosis not present

## 2018-05-30 DIAGNOSIS — M8949 Other hypertrophic osteoarthropathy, multiple sites: Secondary | ICD-10-CM

## 2018-05-30 DIAGNOSIS — I10 Essential (primary) hypertension: Secondary | ICD-10-CM

## 2018-05-30 DIAGNOSIS — M159 Polyosteoarthritis, unspecified: Secondary | ICD-10-CM

## 2018-05-30 DIAGNOSIS — I639 Cerebral infarction, unspecified: Secondary | ICD-10-CM

## 2018-05-30 DIAGNOSIS — M150 Primary generalized (osteo)arthritis: Secondary | ICD-10-CM | POA: Diagnosis not present

## 2018-05-30 DIAGNOSIS — W19XXXD Unspecified fall, subsequent encounter: Secondary | ICD-10-CM

## 2018-05-30 NOTE — Progress Notes (Signed)
[facility] [place], [place] [address]  Internal MEDICINE  Office Visit Note  Patient Name: Stephanie Harris  [id number]  [id number]  Date of Service: 05/30/2018     Chief Complaint  Patient presents with  . Hypertension  . Back Pain    took a fall on Thursday ,   . Concussion    was nausea and vomitting, very difficult to lay head down  . Neck Pain     HPI Pt is here for recent hospital follow up.  She reports she fell down 4 days ago.  She was evaluated in the emergency department.  They diagnosed her with a concussion.  She reports some episodes of nausea and vomiting since fall. Her head CT was negative in the ER. Before this fall, she had multiple other falls, and reports she has been suffering from a broken back.  Pt ambulates with walker.  She also has a history htn.  Her blood pressure has been stable at this time.     Current Medication: Outpatient Encounter Medications as of 05/30/2018  Medication Sig  . acetaminophen (TYLENOL) 325 MG tablet Take 2 tablets (650 mg total) by mouth every 6 (six) hours as needed for mild pain (or Fever >/= 101).  Marland Kitchen ascorbic acid (VITAMIN C) 1000 MG tablet Take 1,000 mg by mouth daily.  Marland Kitchen aspirin 81 MG EC tablet Take 81 mg by mouth daily.   Marland Kitchen atorvastatin (LIPITOR) 20 MG tablet Take 1 tablet (20 mg total) by mouth daily at 6 PM.  . carvedilol (COREG) 3.125 MG tablet Take 1 tablet (3.125 mg total) by mouth 2 (two) times daily with a meal.  . cholecalciferol (VITAMIN D) 1000 UNITS tablet Take 1,000 Units by mouth daily.  . cyanocobalamin (,VITAMIN B-12,) 1000 MCG/ML injection Inject 1,000 mcg into the muscle every 30 (thirty) days. At the 1st of the month - GIVING EARLY AT PT REQUEST- PT MISSED THE INJECTION LAST MONTH  . folic acid (FOLVITE) 761 MCG tablet Take 400 mcg by mouth daily.  . hydrOXYzine (ATARAX/VISTARIL) 10 MG tablet Take 1 tablet (10 mg total) by mouth 3 (three) times daily as needed.  Marland Kitchen levothyroxine  (SYNTHROID, LEVOTHROID) 50 MCG tablet Take 50 mcg by mouth daily.   Marland Kitchen lisinopril (PRINIVIL,ZESTRIL) 5 MG tablet Take 1 tablet (5 mg total) by mouth daily.  . ondansetron (ZOFRAN ODT) 4 MG disintegrating tablet Take 1 tablet (4 mg total) by mouth every 8 (eight) hours as needed for nausea or vomiting.  . polyethylene glycol (MIRALAX / GLYCOLAX) packet Take 17 g by mouth daily as needed.  . vitamin E 400 UNIT capsule Take 400 Units by mouth daily.   No facility-administered encounter medications on file as of 05/30/2018.     Surgical History: Past Surgical History:  Procedure Laterality Date  . BACK SURGERY    . CAROTID ENDARTERECTOMY Left   . CAROTID STENT INSERTION Right    "have 2 stents in there" (03/18/2018)  . CATARACT EXTRACTION W/ INTRAOCULAR LENS  IMPLANT, BILATERAL Bilateral 06/16/2001 - 05/28/2003   +23.5D     22.5D  . CHOLECYSTECTOMY OPEN  1980  . COLONOSCOPY  2010  . CORONARY ANGIOPLASTY WITH STENT PLACEMENT    . EYELID SURGERY Bilateral 2005   BUL BLEPH  . FRACTURE SURGERY    . JOINT REPLACEMENT    . KYPHOPLASTY N/A 08/12/2017   Procedure: KYPHOPLASTY;  Surgeon: Hessie Knows, MD;  Location: [facility] ORS;  Service: Orthopedics;  Laterality: N/A;  .  KYPHOPLASTY N/A 11/08/2017   Procedure: Hewitt Shorts;  Surgeon: Hessie Knows, MD;  Location: [facility] ORS;  Service: Orthopedics;  Laterality: N/A;  . PERCUTANEOUS PLACEMENT INTRAVASCULAR STENT CERVICAL CAROTID ARTERY  06/14/2009  . ROTATOR CUFF REPAIR Left   . TOTAL ABDOMINAL HYSTERECTOMY  1978   WITH REMOVAL TUBES & /OR OVARIES  . TOTAL KNEE ARTHROPLASTY Right     Medical History: Past Medical History:  Diagnosis Date  . Acute bronchiolitis   . Acute pharyngitis   . Allergic rhinitis due to pollen   . Anemia, unspecified    CKD and LGL  . Anxiety state    unspecified  . Arthritis    "knees, legs" (03/18/2018)  . Asthma without status asthmaticus    unspecified  . B12 deficiency   . Beta thalassemia trait   . Carotid  artery occlusion   . Cellulitis and abscess of leg, except foot   . Cervical spondylosis   . Cervicalgia   . Chronic kidney disease   . Chronic lower back pain   . CML (chronic myelocytic leukemia) ([place])    Onc at [facility]  . Complication of anesthesia    "last back surgery they liked to never get me awake" (03/18/2018)  . Coronary artery disease 2009   heart attack with stent  . Coronary atherosclerosis of native coronary artery   . Depressive disorder    not elsewhere classified  . Difficulty in walking   . Disorder of breast, unspecified   . Dizziness and giddiness   . Dysuria   . Esophageal reflux   . Essential hypertension    unspecified  . Family history of adverse reaction to anesthesia    "liked to never get my sister awake" (03/18/2018)  . Head injury    unspecified  . Headache    "q time I have a stroke" (03/18/2018)  . Heart disease   . Hematuria, unspecified   . History of blood transfusion    "had 22 when they found out I had leukemia" (03/18/2018)  . Hypersomnia, unspecified   . Hypertension   . Hypopotassemia   . Hypothyroidism   . ICH (intracerebral hemorrhage) ([place])   . Ill-defined cerebrovascular disease    other  . Insomnia    unspecified  . 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 Leukemia ([place])   . Lower urinary tract infection   . Lumbago   . Mini stroke ([place])    x years  . Mixed hyperlipidemia   . Myocardial infarction ([place]) ~2017  . Nontoxic nodular goiter    unspecified  . Occlusion and stenosis of unspecified carotid artery    without mention of cerebral infarction  . Osteoarthritis   . Osteoporosis   . Other constipation   . Other vitamin B12 deficiency anemias   . Otitis media, unspecified, unspecified ear   . Ovarian failure    unspecified  . Pain in limb   . Panic disorder without agoraphobia   . Peripheral vascular disease ([place])    unspecified  . Phlebitis of left arm   . RA (rheumatoid arthritis) ([place])   . Sigmoid  polyp 1998  . Sleep disturbance    unspecified  . Stroke ([place]) 02/2017   "lots of mini strokes; big one 02/2017; that one made me weak in my knees,; never fully recovered" (03/18/2018)  . Syncope and collapse   . Thalassemia   . TIA (transient ischemic attack)    2000 and 2008 right carotid stent 08/14/2009 on Plavix  .  TIA (transient ischemic attack) 03/18/2018  . Transient disorder of initiating or maintaining sleep   . Type II diabetes mellitus ([place])   . Varicose veins of bilateral lower extremities with other complications   . Varicose veins of bilateral lower extremities with other complications   . Vision abnormalities     Family History: Family History  Problem Relation Age of Onset  . Acute myelogenous leukemia Brother   . Anemia Brother   . Coronary artery disease Brother   . Heart disease Brother   . Stroke Brother   . Heart failure Brother   . Stroke Mother   . Anemia Mother   . Heart disease Mother   . Heart failure Mother   . Hypertension Mother   . Osteoarthritis Mother   . Rheum arthritis Mother   . Heart attack Father   . Anemia Sister   . Cataracts Sister   . Osteoarthritis Sister   . Rheum arthritis Sister   . Stroke Sister   . Heart disease Sister   . Thalassemia Sister   . Thalassemia Sister   . Blindness Neg Hx   . Glaucoma Neg Hx   . Macular degeneration Neg Hx   . Strabismus Neg Hx   . Vision loss Neg Hx   . Basal cell carcinoma Neg Hx   . GU problems Neg Hx   . Kidney cancer Neg Hx   . Melanoma Neg Hx   . Kidney disease Neg Hx   . Prostate cancer Neg Hx   . Squamous cell carcinoma Neg Hx     Social History   Socioeconomic History  . Marital status: Married    Spouse name: Not on file  . Number of children: Not on file  . Years of education: Not on file  . Highest education level: Not on file  Occupational History  . Not on file  Social Needs  . Financial resource strain: Not on file  . Food insecurity:    Worry: Not on file     Inability: Not on file  . Transportation needs:    Medical: Not on file    Non-medical: Not on file  Tobacco Use  . Smoking status: Never Smoker  . Smokeless tobacco: Never Used  Substance and Sexual Activity  . Alcohol use: No  . Drug use: No  . Sexual activity: Never  Lifestyle  . Physical activity:    Days per week: Not on file    Minutes per session: Not on file  . Stress: Not on file  Relationships  . Social connections:    Talks on phone: Not on file    Gets together: Not on file    Attends religious service: Not on file    Active member of club or organization: Not on file    Attends meetings of clubs or organizations: Not on file    Relationship status: Not on file  . Intimate partner violence:    Fear of current or ex partner: Not on file    Emotionally abused: Not on file    Physically abused: Not on file    Forced sexual activity: Not on file  Other Topics Concern  . Not on file  Social History Narrative  . Not on file      Review of Systems  Constitutional: Negative for chills, fatigue and unexpected weight change.  HENT: Negative for congestion, rhinorrhea, sneezing and sore throat.   Eyes: Negative for photophobia, pain and redness.  Respiratory: Negative  for cough, chest tightness and shortness of breath.   Cardiovascular: Negative for chest pain and palpitations.  Gastrointestinal: Negative for abdominal pain, constipation, diarrhea, nausea and vomiting.  Endocrine: Negative.   Genitourinary: Negative for dysuria and frequency.  Musculoskeletal: Negative for arthralgias, back pain, joint swelling and neck pain.  Skin: Negative for rash.  Allergic/Immunologic: Negative.   Neurological: Positive for weakness and headaches. Negative for tremors and numbness.  Hematological: Negative for adenopathy. Does not bruise/bleed easily.  Psychiatric/Behavioral: Negative for behavioral problems and sleep disturbance. The patient is not nervous/anxious.      Vital Signs: BP 130/62   Pulse 82   Resp 16   Ht 5' (1.524 m)   Wt 101 lb (45.8 kg)   SpO2 98%   BMI 19.73 kg/m    Physical Exam  Constitutional: She is oriented to person, place, and time. She appears well-developed and well-nourished. No distress.  HENT:  Head: Normocephalic and atraumatic.  Mouth/Throat: Oropharynx is clear and moist. No oropharyngeal exudate.  Eyes: Pupils are equal, round, and reactive to light. EOM are normal.  Neck: Normal range of motion. Neck supple. No JVD present. No tracheal deviation present. No thyromegaly present.  Cardiovascular: Normal rate, regular rhythm and normal heart sounds. Exam reveals no gallop and no friction rub.  No murmur heard. Pulmonary/Chest: Effort normal and breath sounds normal. No respiratory distress. She has no wheezes. She has no rales. She exhibits no tenderness.  Abdominal: Soft. There is no tenderness. There is no guarding.  Musculoskeletal: Normal range of motion.  Lymphadenopathy:    She has no cervical adenopathy.  Neurological: She is alert and oriented to person, place, and time. No cranial nerve deficit.  Skin: Skin is warm and dry. She is not diaphoretic.  Psychiatric: She has a normal mood and affect. Her behavior is normal. Judgment and thought content normal.  Nursing note and vitals reviewed.  Assessment/Plan: 1. Fall, subsequent encounter Pt fell 4 days ago.  No new Fx, or head trauma.   2. Essential hypertension, benign Pt's bp stable.  Continue current therapy.    3. Cerebrovascular accident (CVA), unspecified mechanism ([place]) History of stroke.    4. Primary osteoarthritis involving multiple joints Pt reports pain is controlled at this time.  Denies current need.   5. Diabetes mellitus without complication ([place]) Pts last A1C was 5.0.  No medication   General Counseling: Stephanie Harris verbalizes understanding of the findings of todays visit and agrees with plan of treatment. I have discussed any  further diagnostic evaluation that may be needed or ordered today. We also reviewed her medications today. she has been encouraged to call the office with any questions or concerns that should arise related to todays visit.   No orders of the defined types were placed in this encounter.     I have reviewed all medical records from hospital follow up including radiology reports and consults from other physicians. Appropriate follow up diagnostics will be scheduled as needed. Patient/ Family understands the plan of treatment. Time spent 25 minutes.   Orson Gear AGNP-C Internal Medicine

## 2018-06-01 ENCOUNTER — Ambulatory Visit: Payer: Self-pay | Admitting: Nurse Practitioner

## 2018-06-03 ENCOUNTER — Other Ambulatory Visit: Payer: Self-pay | Admitting: Adult Health

## 2018-06-03 ENCOUNTER — Ambulatory Visit
Admission: RE | Admit: 2018-06-03 | Discharge: 2018-06-03 | Disposition: A | Discharge status: Home / Self Care | Payer: Medicare Other | Source: Ambulatory Visit | Attending: Adult Health | Admitting: Adult Health

## 2018-06-03 ENCOUNTER — Telehealth: Payer: Self-pay

## 2018-06-03 ENCOUNTER — Other Ambulatory Visit: Payer: Self-pay

## 2018-06-03 ENCOUNTER — Ambulatory Visit: Payer: Self-pay | Admitting: Nurse Practitioner

## 2018-06-03 DIAGNOSIS — W19XXXD Unspecified fall, subsequent encounter: Secondary | ICD-10-CM

## 2018-06-03 DIAGNOSIS — S3992XA Unspecified injury of lower back, initial encounter: Secondary | ICD-10-CM | POA: Diagnosis not present

## 2018-06-03 DIAGNOSIS — Z9889 Other specified postprocedural states: Secondary | ICD-10-CM | POA: Diagnosis not present

## 2018-06-03 DIAGNOSIS — M4854XA Collapsed vertebra, not elsewhere classified, thoracic region, initial encounter for fracture: Secondary | ICD-10-CM | POA: Insufficient documentation

## 2018-06-03 DIAGNOSIS — M545 Low back pain: Secondary | ICD-10-CM | POA: Diagnosis not present

## 2018-06-03 DIAGNOSIS — I700 Atherosclerosis of aorta: Secondary | ICD-10-CM | POA: Insufficient documentation

## 2018-06-03 DIAGNOSIS — M25551 Pain in right hip: Secondary | ICD-10-CM | POA: Diagnosis not present

## 2018-06-03 DIAGNOSIS — S79911A Unspecified injury of right hip, initial encounter: Secondary | ICD-10-CM | POA: Diagnosis not present

## 2018-06-03 NOTE — Telephone Encounter (Signed)
Pt  Advised we put order for xrays for right leg

## 2018-06-03 NOTE — Progress Notes (Signed)
error 

## 2018-06-04 ENCOUNTER — Emergency Department: Payer: Medicare Other

## 2018-06-04 ENCOUNTER — Emergency Department
Admission: EM | Admit: 2018-06-04 | Discharge: 2018-06-04 | Disposition: A | Discharge status: Home / Self Care | Payer: Medicare Other | Attending: Emergency Medicine | Admitting: Emergency Medicine

## 2018-06-04 ENCOUNTER — Other Ambulatory Visit: Payer: Self-pay

## 2018-06-04 DIAGNOSIS — I2510 Atherosclerotic heart disease of native coronary artery without angina pectoris: Secondary | ICD-10-CM | POA: Insufficient documentation

## 2018-06-04 DIAGNOSIS — J45909 Unspecified asthma, uncomplicated: Secondary | ICD-10-CM | POA: Diagnosis not present

## 2018-06-04 DIAGNOSIS — R51 Headache: Secondary | ICD-10-CM | POA: Diagnosis not present

## 2018-06-04 DIAGNOSIS — S0990XA Unspecified injury of head, initial encounter: Secondary | ICD-10-CM | POA: Diagnosis not present

## 2018-06-04 DIAGNOSIS — Z856 Personal history of leukemia: Secondary | ICD-10-CM | POA: Diagnosis not present

## 2018-06-04 DIAGNOSIS — Z91040 Latex allergy status: Secondary | ICD-10-CM | POA: Diagnosis not present

## 2018-06-04 DIAGNOSIS — Z7982 Long term (current) use of aspirin: Secondary | ICD-10-CM | POA: Insufficient documentation

## 2018-06-04 DIAGNOSIS — Z8673 Personal history of transient ischemic attack (TIA), and cerebral infarction without residual deficits: Secondary | ICD-10-CM | POA: Insufficient documentation

## 2018-06-04 DIAGNOSIS — Z79899 Other long term (current) drug therapy: Secondary | ICD-10-CM | POA: Diagnosis not present

## 2018-06-04 DIAGNOSIS — I10 Essential (primary) hypertension: Secondary | ICD-10-CM | POA: Diagnosis not present

## 2018-06-04 DIAGNOSIS — R42 Dizziness and giddiness: Secondary | ICD-10-CM | POA: Diagnosis not present

## 2018-06-04 DIAGNOSIS — M25551 Pain in right hip: Secondary | ICD-10-CM | POA: Diagnosis not present

## 2018-06-04 DIAGNOSIS — E039 Hypothyroidism, unspecified: Secondary | ICD-10-CM | POA: Diagnosis not present

## 2018-06-04 DIAGNOSIS — Z9049 Acquired absence of other specified parts of digestive tract: Secondary | ICD-10-CM | POA: Insufficient documentation

## 2018-06-04 DIAGNOSIS — I252 Old myocardial infarction: Secondary | ICD-10-CM | POA: Diagnosis not present

## 2018-06-04 DIAGNOSIS — M542 Cervicalgia: Secondary | ICD-10-CM | POA: Diagnosis not present

## 2018-06-04 DIAGNOSIS — Z955 Presence of coronary angioplasty implant and graft: Secondary | ICD-10-CM | POA: Insufficient documentation

## 2018-06-04 DIAGNOSIS — Z96651 Presence of right artificial knee joint: Secondary | ICD-10-CM | POA: Diagnosis not present

## 2018-06-04 DIAGNOSIS — W19XXXD Unspecified fall, subsequent encounter: Secondary | ICD-10-CM

## 2018-06-04 DIAGNOSIS — W0110XA Fall on same level from slipping, tripping and stumbling with subsequent striking against unspecified object, initial encounter: Secondary | ICD-10-CM | POA: Insufficient documentation

## 2018-06-04 DIAGNOSIS — S199XXA Unspecified injury of neck, initial encounter: Secondary | ICD-10-CM | POA: Diagnosis not present

## 2018-06-04 LAB — CBC
HCT: 32.3 % — ABNORMAL LOW (ref 36.0–46.0)
Hemoglobin: 9.7 g/dL — ABNORMAL LOW (ref 12.0–15.0)
MCH: 19.1 pg — ABNORMAL LOW (ref 26.0–34.0)
MCHC: 30 g/dL (ref 30.0–36.0)
MCV: 63.7 fL — ABNORMAL LOW (ref 80.0–100.0)
Platelets: 149 10*3/uL — ABNORMAL LOW (ref 150–400)
RBC: 5.07 MIL/uL (ref 3.87–5.11)
RDW: 16.3 % — ABNORMAL HIGH (ref 11.5–15.5)
WBC: 6.9 10*3/uL (ref 4.0–10.5)
nRBC: 0 % (ref 0.0–0.2)

## 2018-06-04 LAB — BASIC METABOLIC PANEL
Anion gap: 9 (ref 5–15)
BUN: 24 mg/dL — ABNORMAL HIGH (ref 8–23)
CO2: 24 mmol/L (ref 22–32)
Calcium: 9.4 mg/dL (ref 8.9–10.3)
Chloride: 105 mmol/L (ref 98–111)
Creatinine, Ser: 1.11 mg/dL — ABNORMAL HIGH (ref 0.44–1.00)
GFR calc Af Amer: 51 mL/min — ABNORMAL LOW (ref >60)
GFR calc non Af Amer: 44 mL/min — ABNORMAL LOW (ref >60)
Glucose, Bld: 118 mg/dL — ABNORMAL HIGH (ref 70–99)
Potassium: 3.8 mmol/L (ref 3.5–5.1)
Sodium: 138 mmol/L (ref 135–145)

## 2018-06-04 LAB — TROPONIN I: Troponin I: <0.03 ng/mL (ref <0.03)
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
## 2018-06-04 NOTE — Progress Notes (Signed)
LCSW met with patient briefly with ED charge nurse and ED RN. LCSW asked patient if she has the following supports in place and if she is still followed by APS. She stated yes and yes.  CSW received phone call back from [place] worker Laurence Slate [phone] who reported that there was an open case, but it was closed on Wednesday.  APS reports that they have put CAPS in place to have caregivers through [employer] come to the patient's house every day from 10-4 to assist patient and husband with caregiving needs which will be starting on Monday the 7th of October.  APS worker stated they are hopeful that having caregivers in the home will help determine how safe patient's situation is and reduce the amount of caregiver stress that patient's husband is experiencing.  Patient came back to ED because she is having hip pain.  [employer] LCSW [phone]
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
## 2018-06-04 NOTE — ED Notes (Signed)
AAOx3.  Skin warm and dry.  NAD 

## 2018-06-06 ENCOUNTER — Telehealth: Payer: Self-pay | Admitting: Adult Health

## 2018-06-06 NOTE — Telephone Encounter (Signed)
PATIENT IS WANTING RESULTS OF HER XRAYS

## 2018-06-07 NOTE — Telephone Encounter (Signed)
Patient was called and informed of her resutls, patient states that she went to Er on Saturday , and she was told she had contusions , patient is complaining of still not being able to walk or turn neck, Advised patient on rest , she states that she has been using ice and heat, told her not to use heat that it could aggravate it more, patient is to use ice , and anti-inflammatory .

## 2018-06-13 ENCOUNTER — Telehealth: Payer: Self-pay

## 2018-06-13 NOTE — Telephone Encounter (Signed)
Pt called that her leg worse and she passing put when she turned  her head and also weak and hard to stand up pt want mri done as per heather and adam advised pt need to go to ER

## 2018-06-13 NOTE — Telephone Encounter (Signed)
Called [facility] er head nurse (kim) to advise her that Mrs. Fasnacht will come via private car for weakness and passing out.tat

## 2018-06-16 DIAGNOSIS — N183 Chronic kidney disease, stage 3 (moderate): Secondary | ICD-10-CM | POA: Diagnosis not present

## 2018-06-16 DIAGNOSIS — R54 Age-related physical debility: Secondary | ICD-10-CM | POA: Diagnosis not present

## 2018-06-16 DIAGNOSIS — I610 Nontraumatic intracerebral hemorrhage in hemisphere, subcortical: Secondary | ICD-10-CM | POA: Diagnosis not present

## 2018-06-16 DIAGNOSIS — I10 Essential (primary) hypertension: Secondary | ICD-10-CM | POA: Diagnosis not present

## 2018-06-16 DIAGNOSIS — I6503 Occlusion and stenosis of bilateral vertebral arteries: Secondary | ICD-10-CM | POA: Diagnosis not present

## 2018-06-16 DIAGNOSIS — I619 Nontraumatic intracerebral hemorrhage, unspecified: Secondary | ICD-10-CM | POA: Diagnosis not present

## 2018-06-16 DIAGNOSIS — D631 Anemia in chronic kidney disease: Secondary | ICD-10-CM | POA: Diagnosis not present

## 2018-06-16 DIAGNOSIS — T82855A Stenosis of coronary artery stent, initial encounter: Secondary | ICD-10-CM | POA: Diagnosis not present

## 2018-06-16 DIAGNOSIS — D72820 Lymphocytosis (symptomatic): Secondary | ICD-10-CM | POA: Diagnosis not present

## 2018-06-20 ENCOUNTER — Telehealth: Payer: Self-pay

## 2018-06-21 NOTE — Telephone Encounter (Signed)
Hey. Can we call this lady when you have a chance on Wednesday? I thought the  case was closed.
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
## 2018-06-30 DIAGNOSIS — D630 Anemia in neoplastic disease: Secondary | ICD-10-CM | POA: Diagnosis not present

## 2018-06-30 DIAGNOSIS — W19XXXA Unspecified fall, initial encounter: Secondary | ICD-10-CM | POA: Diagnosis not present

## 2018-06-30 DIAGNOSIS — C91Z0 Other lymphoid leukemia not having achieved remission: Secondary | ICD-10-CM | POA: Diagnosis not present

## 2018-06-30 DIAGNOSIS — S3210XA Unspecified fracture of sacrum, initial encounter for closed fracture: Secondary | ICD-10-CM | POA: Diagnosis not present

## 2018-06-30 DIAGNOSIS — T1490XA Injury, unspecified, initial encounter: Secondary | ICD-10-CM | POA: Diagnosis not present

## 2018-06-30 DIAGNOSIS — S3993XA Unspecified injury of pelvis, initial encounter: Secondary | ICD-10-CM | POA: Diagnosis not present

## 2018-06-30 DIAGNOSIS — S3219XA Other fracture of sacrum, initial encounter for closed fracture: Secondary | ICD-10-CM | POA: Diagnosis not present

## 2018-06-30 DIAGNOSIS — D599 Acquired hemolytic anemia, unspecified: Secondary | ICD-10-CM | POA: Diagnosis not present

## 2018-06-30 DIAGNOSIS — E440 Moderate protein-calorie malnutrition: Secondary | ICD-10-CM | POA: Diagnosis not present

## 2018-07-01 ENCOUNTER — Other Ambulatory Visit: Payer: Self-pay

## 2018-07-01 ENCOUNTER — Emergency Department
Admission: EM | Admit: 2018-07-01 | Discharge: 2018-07-01 | Payer: Medicare Other | Attending: Emergency Medicine | Admitting: Emergency Medicine

## 2018-07-01 ENCOUNTER — Emergency Department: Payer: Medicare Other

## 2018-07-01 ENCOUNTER — Encounter: Payer: Self-pay | Admitting: Emergency Medicine

## 2018-07-01 DIAGNOSIS — S300XXA Contusion of lower back and pelvis, initial encounter: Secondary | ICD-10-CM | POA: Insufficient documentation

## 2018-07-01 DIAGNOSIS — I2510 Atherosclerotic heart disease of native coronary artery without angina pectoris: Secondary | ICD-10-CM | POA: Insufficient documentation

## 2018-07-01 DIAGNOSIS — S0990XA Unspecified injury of head, initial encounter: Secondary | ICD-10-CM | POA: Diagnosis not present

## 2018-07-01 DIAGNOSIS — S3219XA Other fracture of sacrum, initial encounter for closed fracture: Secondary | ICD-10-CM | POA: Diagnosis not present

## 2018-07-01 DIAGNOSIS — Z7982 Long term (current) use of aspirin: Secondary | ICD-10-CM | POA: Insufficient documentation

## 2018-07-01 DIAGNOSIS — W182XXA Fall in (into) shower or empty bathtub, initial encounter: Secondary | ICD-10-CM | POA: Diagnosis not present

## 2018-07-01 DIAGNOSIS — M545 Low back pain: Secondary | ICD-10-CM | POA: Diagnosis not present

## 2018-07-01 DIAGNOSIS — S3993XA Unspecified injury of pelvis, initial encounter: Secondary | ICD-10-CM | POA: Diagnosis not present

## 2018-07-01 DIAGNOSIS — E1122 Type 2 diabetes mellitus with diabetic chronic kidney disease: Secondary | ICD-10-CM | POA: Insufficient documentation

## 2018-07-01 DIAGNOSIS — S3210XA Unspecified fracture of sacrum, initial encounter for closed fracture: Secondary | ICD-10-CM | POA: Insufficient documentation

## 2018-07-01 DIAGNOSIS — I129 Hypertensive chronic kidney disease with stage 1 through stage 4 chronic kidney disease, or unspecified chronic kidney disease: Secondary | ICD-10-CM | POA: Diagnosis not present

## 2018-07-01 DIAGNOSIS — R0789 Other chest pain: Secondary | ICD-10-CM | POA: Diagnosis not present

## 2018-07-01 DIAGNOSIS — Y939 Activity, unspecified: Secondary | ICD-10-CM | POA: Insufficient documentation

## 2018-07-01 DIAGNOSIS — W19XXXA Unspecified fall, initial encounter: Secondary | ICD-10-CM | POA: Diagnosis not present

## 2018-07-01 DIAGNOSIS — Y999 Unspecified external cause status: Secondary | ICD-10-CM | POA: Diagnosis not present

## 2018-07-01 DIAGNOSIS — E039 Hypothyroidism, unspecified: Secondary | ICD-10-CM | POA: Insufficient documentation

## 2018-07-01 DIAGNOSIS — S51812A Laceration without foreign body of left forearm, initial encounter: Secondary | ICD-10-CM | POA: Diagnosis not present

## 2018-07-01 DIAGNOSIS — W1789XA Other fall from one level to another, initial encounter: Secondary | ICD-10-CM | POA: Diagnosis not present

## 2018-07-01 DIAGNOSIS — Y929 Unspecified place or not applicable: Secondary | ICD-10-CM | POA: Insufficient documentation

## 2018-07-01 DIAGNOSIS — N189 Chronic kidney disease, unspecified: Secondary | ICD-10-CM | POA: Insufficient documentation

## 2018-07-01 DIAGNOSIS — T148XXA Other injury of unspecified body region, initial encounter: Secondary | ICD-10-CM

## 2018-07-01 DIAGNOSIS — Y92009 Unspecified place in unspecified non-institutional (private) residence as the place of occurrence of the external cause: Secondary | ICD-10-CM | POA: Diagnosis not present

## 2018-07-01 DIAGNOSIS — S80812A Abrasion, left lower leg, initial encounter: Secondary | ICD-10-CM | POA: Diagnosis not present

## 2018-07-01 DIAGNOSIS — S299XXA Unspecified injury of thorax, initial encounter: Secondary | ICD-10-CM | POA: Diagnosis not present

## 2018-07-01 DIAGNOSIS — Z91040 Latex allergy status: Secondary | ICD-10-CM | POA: Insufficient documentation

## 2018-07-01 DIAGNOSIS — S199XXA Unspecified injury of neck, initial encounter: Secondary | ICD-10-CM | POA: Diagnosis not present

## 2018-07-01 DIAGNOSIS — J45909 Unspecified asthma, uncomplicated: Secondary | ICD-10-CM | POA: Diagnosis not present

## 2018-07-01 DIAGNOSIS — S59912A Unspecified injury of left forearm, initial encounter: Secondary | ICD-10-CM | POA: Diagnosis not present

## 2018-07-01 DIAGNOSIS — S3992XA Unspecified injury of lower back, initial encounter: Secondary | ICD-10-CM | POA: Diagnosis present

## 2018-07-01 DIAGNOSIS — S3289XA Fracture of other parts of pelvis, initial encounter for closed fracture: Secondary | ICD-10-CM | POA: Diagnosis not present

## 2018-07-01 DIAGNOSIS — S20229A Contusion of unspecified back wall of thorax, initial encounter: Secondary | ICD-10-CM | POA: Diagnosis not present

## 2018-07-01 DIAGNOSIS — Z789 Other specified health status: Secondary | ICD-10-CM

## 2018-07-01 LAB — CBC
HCT: 29 % — ABNORMAL LOW (ref 36.0–46.0)
Hemoglobin: 8.7 g/dL — ABNORMAL LOW (ref 12.0–15.0)
MCH: 19.1 pg — ABNORMAL LOW (ref 26.0–34.0)
MCHC: 30 g/dL (ref 30.0–36.0)
MCV: 63.7 fL — ABNORMAL LOW (ref 80.0–100.0)
Platelets: 109 10*3/uL — ABNORMAL LOW (ref 150–400)
RBC: 4.55 MIL/uL (ref 3.87–5.11)
RDW: 17.3 % — ABNORMAL HIGH (ref 11.5–15.5)
WBC: 5.4 10*3/uL (ref 4.0–10.5)
nRBC: 0 % (ref 0.0–0.2)

## 2018-07-01 LAB — TYPE AND SCREEN
ABO/RH(D): A POS
Antibody Screen: NEGATIVE

## 2018-07-01 LAB — BASIC METABOLIC PANEL
Anion gap: 8 (ref 5–15)
BUN: 25 mg/dL — ABNORMAL HIGH (ref 8–23)
CO2: 26 mmol/L (ref 22–32)
Calcium: 9.1 mg/dL (ref 8.9–10.3)
Chloride: 107 mmol/L (ref 98–111)
Creatinine, Ser: 1.16 mg/dL — ABNORMAL HIGH (ref 0.44–1.00)
GFR calc Af Amer: 48 mL/min — ABNORMAL LOW (ref >60)
GFR calc non Af Amer: 42 mL/min — ABNORMAL LOW (ref >60)
Glucose, Bld: 118 mg/dL — ABNORMAL HIGH (ref 70–99)
Potassium: 3.9 mmol/L (ref 3.5–5.1)
Sodium: 141 mmol/L (ref 135–145)

## 2018-07-01 LAB — PROTIME-INR
INR: 1.07
Prothrombin Time: 13.8 seconds (ref 11.4–15.2)

## 2018-07-01 MED ORDER — IOHEXOL 300 MG/ML  SOLN
75.0000 mL | Freq: Once | INTRAMUSCULAR | Status: AC | PRN
  Administered 2018-07-01: 75 mL via INTRAVENOUS
  Filled 2018-07-01: qty 75

## 2018-07-01 MED ORDER — ACETAMINOPHEN 325 MG PO TABS
650.0000 mg | ORAL_TABLET | Freq: Once | ORAL | Status: AC
  Administered 2018-07-01: 650 mg via ORAL
  Filled 2018-07-01: qty 2

## 2018-07-01 NOTE — ED Notes (Signed)
Left arm wound cleaned and bandage applied.

## 2018-07-01 NOTE — ED Triage Notes (Signed)
Pt to ED via EMS from home with mechanical fall. Pt c/o back pain , hx of back surgeries. Denies head injury. H&ER7

## 2018-07-01 NOTE — ED Notes (Signed)
EMTAL[facility] verified

## 2018-07-01 NOTE — ED Provider Notes (Signed)
[facility] Emergency Department Provider Note  ____________________________________________  Time seen: Approximately 1:59 PM  I have reviewed the triage vital signs and the nursing notes.   HISTORY  Chief Complaint Fall    HPI Stephanie Harris is a 84 y.o. female with PMH CVA, CAD, leukemia, CKD, HTN that presents emergency department for evaluation after mechanical fall.  Patient states that she was using her lift in the bathroom when she bent over to grab something and lost her balance and fell to the floor.  She has history of frequent falls.  She hit her back on the bathtub and landed on her buttocks.  She is currently having pain to her left buttocks.  She takes a baby aspirin daily.  She did not hit her head or lose consciousness.  She last ate at 9 AM today.  Patient states that she can only take Tylenol for pain.  No headache, neck pain, shortness of breath, chest pain, abdominal pain.  Past Medical History:  Diagnosis Date  . Acute bronchiolitis   . Acute pharyngitis   . Allergic rhinitis due to pollen   . Anemia, unspecified    CKD and LGL  . Anxiety state    unspecified  . Arthritis    "knees, legs" (03/18/2018)  . Asthma without status asthmaticus    unspecified  . B12 deficiency   . Beta thalassemia trait   . Carotid artery occlusion   . Cellulitis and abscess of leg, except foot   . Cervical spondylosis   . Cervicalgia   . Chronic kidney disease   . Chronic lower back pain   . CML (chronic myelocytic leukemia) ([place])    Onc at [facility]  . Complication of anesthesia    "last back surgery they liked to never get me awake" (03/18/2018)  . Coronary artery disease 2009   heart attack with stent  . Coronary atherosclerosis of native coronary artery   . Depressive disorder    not elsewhere classified  . Difficulty in walking   . Disorder of breast, unspecified   . Dizziness and giddiness   . Dysuria   . Esophageal reflux   . Essential  hypertension    unspecified  . Family history of adverse reaction to anesthesia    "liked to never get my sister awake" (03/18/2018)  . Head injury    unspecified  . Headache    "q time I have a stroke" (03/18/2018)  . Heart disease   . Hematuria, unspecified   . History of blood transfusion    "had 22 when they found out I had leukemia" (03/18/2018)  . Hypersomnia, unspecified   . Hypertension   . Hypopotassemia   . Hypothyroidism   . ICH (intracerebral hemorrhage) ([place])   . Ill-defined cerebrovascular disease    other  . Insomnia    unspecified  . Large granular lymphocyte disorder ([place]) 12/2001  . Leukemia ([place])   . Lower urinary tract infection   . Lumbago   . Mini stroke ([place])    x years  . Mixed hyperlipidemia   . Myocardial infarction ([place]) ~2017  . Nontoxic nodular goiter    unspecified  . Occlusion and stenosis of unspecified carotid artery    without mention of cerebral infarction  . Osteoarthritis   . Osteoporosis   . Other constipation   . Other vitamin B12 deficiency anemias   . Otitis media, unspecified, unspecified ear   . Ovarian failure    unspecified  .  Pain in limb   . Panic disorder without agoraphobia   . Peripheral vascular disease ([place])    unspecified  . Phlebitis of left arm   . RA (rheumatoid arthritis) ([place])   . Sigmoid polyp 1998  . Sleep disturbance    unspecified  . Stroke ([place]) 02/2017   "lots of mini strokes; big one 02/2017; that one made me weak in my knees,; never fully recovered" (03/18/2018)  . Syncope and collapse   . Thalassemia   . TIA (transient ischemic attack)    2000 and 2008 right carotid stent 08/14/2009 on Plavix  . TIA (transient ischemic attack) 03/18/2018  . Transient disorder of initiating or maintaining sleep   . Type II diabetes mellitus ([place])   . Varicose veins of bilateral lower extremities with other complications   . Varicose veins of bilateral lower extremities with other complications   . Vision  abnormalities     Patient Active Problem List   Diagnosis Date Noted  . At risk for domestic violence 05/06/2018  . 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 Pain in right foot 04/06/2018  . Cerebrovascular accident (CVA) ([place]) 04/05/2018  . Essential hypertension, benign 04/05/2018  . Carotid artery calcification, bilateral 04/05/2018  . Occlusion and stenosis of both vertebral arteries   . Stenosis of carotid artery   . Hypertensive emergency   . Protein-calorie malnutrition, severe 11/08/2017  . Lumbar compression fracture ([place]) 11/05/2017  . Closed fracture of proximal end of left humerus with routine healing 08/28/2016  . Acute cystitis 04/28/2016  . Chest pain 03/14/2015  . TIA (transient ischemic attack) 01/29/2015  . Allergic arthritis, hand     Past Surgical History:  Procedure Laterality Date  . BACK SURGERY    . CAROTID ENDARTERECTOMY Left   . CAROTID STENT INSERTION Right    "have 2 stents in there" (03/18/2018)  . CATARACT EXTRACTION W/ INTRAOCULAR LENS  IMPLANT, BILATERAL Bilateral 06/16/2001 - 05/28/2003   +23.5D     22.5D  . CHOLECYSTECTOMY OPEN  1980  . COLONOSCOPY  2010  . CORONARY ANGIOPLASTY WITH STENT PLACEMENT    . EYELID SURGERY Bilateral 2005   BUL BLEPH  . FRACTURE SURGERY    . JOINT REPLACEMENT    . KYPHOPLASTY N/A 08/12/2017   Procedure: KYPHOPLASTY;  Surgeon: Hessie Knows, MD;  Location: [facility] ORS;  Service: Orthopedics;  Laterality: N/A;  . KYPHOPLASTY N/A 11/08/2017   Procedure: Hewitt Shorts;  Surgeon: Hessie Knows, MD;  Location: [facility] ORS;  Service: Orthopedics;  Laterality: N/A;  . PERCUTANEOUS PLACEMENT INTRAVASCULAR STENT CERVICAL CAROTID ARTERY  06/14/2009  . ROTATOR CUFF REPAIR Left   . TOTAL ABDOMINAL HYSTERECTOMY  1978   WITH REMOVAL TUBES & /OR OVARIES  . TOTAL KNEE ARTHROPLASTY Right     Prior to Admission medications   Medication Sig Start Date End Date Taking? Authorizing Provider  acetaminophen  (TYLENOL) 325 MG tablet Take 2 tablets (650 mg total) by mouth every 6 (six) hours as needed for mild pain (or Fever >/= 101). 03/21/18   Enid Derry [place], DO  ascorbic acid (VITAMIN C) 1000 MG tablet Take 1,000 mg by mouth daily.    [provider]  aspirin 81 MG EC tablet Take 81 mg by mouth daily.     [provider]  atorvastatin (LIPITOR) 20 MG tablet Take 1 tablet (20 mg total) by mouth daily at 6 PM. 03/22/18   Enid Derry, [place], DO  carvedilol (COREG) 3.125 MG tablet Take 1 tablet (3.125 mg  total) by mouth 2 (two) times daily with a meal. 03/29/18   Ronnell Freshwater, NP  cholecalciferol (VITAMIN D) 1000 UNITS tablet Take 1,000 Units by mouth daily.    [provider]  cyanocobalamin (,VITAMIN B-12,) 1000 MCG/ML injection Inject 1,000 mcg into the muscle every 30 (thirty) days. At the 1st of the month - [place]    [provider]  folic acid (FOLVITE) 470 MCG tablet Take 400 mcg by mouth daily.    [provider]  hydrOXYzine (ATARAX/VISTARIL) 10 MG tablet Take 1 tablet (10 mg total) by mouth 3 (three) times daily as needed. 05/26/18   Eula Listen, MD  levothyroxine (SYNTHROID, LEVOTHROID) 50 MCG tablet Take 50 mcg by mouth daily.     [provider]  lisinopril (PRINIVIL,ZESTRIL) 5 MG tablet Take 1 tablet (5 mg total) by mouth daily. 04/06/18   Ronnell Freshwater, NP  ondansetron (ZOFRAN ODT) 4 MG disintegrating tablet Take 1 tablet (4 mg total) by mouth every 8 (eight) hours as needed for nausea or vomiting. 05/26/18   Eula Listen, MD  polyethylene glycol (MIRALAX / GLYCOLAX) packet Take 17 g by mouth daily as needed.    [provider]  vitamin E 400 UNIT capsule Take 400 Units by mouth daily.    [provider]    Allergies Latex; Meperidine; Penicillins; Cefuroxime axetil; Codeine; and Propoxyphene  Family History  Problem Relation Age of Onset  .  Acute myelogenous leukemia Brother   . Anemia Brother   . Coronary artery disease Brother   . Heart disease Brother   . Stroke Brother   . Heart failure Brother   . Stroke Mother   . Anemia Mother   . Heart disease Mother   . Heart failure Mother   . Hypertension Mother   . Osteoarthritis Mother   . Rheum arthritis Mother   . Heart attack Father   . Anemia Sister   . Cataracts Sister   . Osteoarthritis Sister   . Rheum arthritis Sister   . Stroke Sister   . Heart disease Sister   . Thalassemia Sister   . Thalassemia Sister   . Blindness Neg Hx   . Glaucoma Neg Hx   . Macular degeneration Neg Hx   . Strabismus Neg Hx   . Vision loss Neg Hx   . Basal cell carcinoma Neg Hx   . GU problems Neg Hx   . Kidney cancer Neg Hx   . Melanoma Neg Hx   . Kidney disease Neg Hx   . Prostate cancer Neg Hx   . Squamous cell carcinoma Neg Hx     Social History Social History   Tobacco Use  . Smoking status: Never Smoker  . Smokeless tobacco: Never Used  Substance Use Topics  . Alcohol use: No  . Drug use: No     Review of Systems  Cardiovascular: No chest pain. Respiratory: No SOB. Gastrointestinal: No abdominal pain.  No vomiting.  Musculoskeletal: Positive for back pain.  Skin: Negative for rash. Positive for ecchymosis.  Neurological: Negative for headaches, numbness or tingling   ____________________________________________   PHYSICAL EXAM:  VITAL SIGNS: ED Triage Vitals [07/01/18 1326]  Enc Vitals Group     BP (!) 191/106     Pulse Rate 77     Resp 16     Temp 97.9 F (36.6 C)     Temp Source Oral  SpO2 100 %     Weight      Height      Head Circumference      Peak Flow      Pain Score 10     Pain Loc      Pain Edu?      Excl. in [place]?      Constitutional: Alert and oriented. Well appearing and in no acute distress. Eyes: Conjunctivae are normal. PERRL. EOMI. Head: Atraumatic. ENT:      Ears:      Nose: No congestion/rhinnorhea.       Mouth/Throat: Mucous membranes are moist.  Neck: No stridor. No cervical spine tenderness to palpation. Cardiovascular: Normal rate, regular rhythm.  Good peripheral circulation. Respiratory: Normal respiratory effort without tachypnea or retractions. Lungs CTAB. Good air entry to the bases with no decreased or absent breath sounds. Gastrointestinal: Bowel sounds 4 quadrants. Soft and nontender to palpation. No guarding or rigidity. No palpable masses. No distention.  Musculoskeletal: Full range of motion to all extremities. No gross deformities appreciated.  Diffuse tenderness palpation to lumbar spine.  Ecchymosis to left thoracic and lumbar paraspinal muscles.  Full range of motion of bilateral hips. Neurologic:  Normal speech and language. No gross focal neurologic deficits are appreciated.  Skin:  Skin is warm, dry.  Large mobile tender mass to left buttocks with small area of overlying ecchymosis.  Moderate ecchymosis to left forearm with 2 linear skin tears. Psychiatric: Mood and affect are normal. Speech and behavior are normal. Patient exhibits appropriate insight and judgement.   ____________________________________________   LABS (all labs ordered are listed, but only abnormal results are displayed)  Labs Reviewed  CBC - Abnormal; Notable for the following components:      Result Value   Hemoglobin 8.7 (*)    HCT 29.0 (*)    MCV 63.7 (*)    MCH 19.1 (*)    RDW 17.3 (*)    Platelets 109 (*)    All other components within normal limits  BASIC METABOLIC PANEL - Abnormal; Notable for the following components:   Glucose, Bld 118 (*)    BUN 25 (*)    Creatinine, Ser 1.16 (*)    GFR calc non Af Amer 42 (*)    GFR calc Af Amer 48 (*)    All other components within normal limits  PROTIME-INR  TYPE AND SCREEN   ____________________________________________  EKG   ____________________________________________  RADIOLOGY Robinette Haines, personally viewed and evaluated  these images (plain radiographs) as part of my medical decision making, as well as reviewing the written report by the radiologist.  Dg Chest 1 View  Result Date: 07/01/2018 CLINICAL DATA:  Status post fall today. EXAM: CHEST  1 VIEW COMPARISON:  August 11, 2017 FINDINGS: The heart size and mediastinal contours are stable. The aorta is tortuous. Both lungs are clear. Mild degenerative joint changes of bilateral shoulders are noted. No definite acute displaced fracture or dislocation is identified. IMPRESSION: No active cardiopulmonary disease. Electronically Signed   By: Abelardo Diesel M.D.   On: 07/01/2018 14:32   Dg Pelvis 1-2 Views  Result Date: 07/01/2018 CLINICAL DATA:  Trauma.  Fall. EXAM: PELVIS - 1-2 VIEW COMPARISON:  Abdominal radiograph 08/04/2017 FINDINGS: Limited study due to osteoporosis and rotation. No evidence of fracture or diastasis. Atherosclerosis. IMPRESSION: No acute finding. Electronically Signed   By: Monte Fantasia M.D.   On: 07/01/2018 14:32   Ct Chest W Contrast  Result Date: 07/01/2018 CLINICAL  DATA:  Pelvic fracture, known or suspected.  Fall today. EXAM: CT CHEST, ABDOMEN, AND PELVIS WITH CONTRAST TECHNIQUE: Multidetector CT imaging of the chest, abdomen and pelvis was performed following the standard protocol during bolus administration of intravenous contrast. CONTRAST:  32mL OMNIPAQUE IOHEXOL 300 MG/ML  SOLN COMPARISON:  Abdomen and pelvis CT 07/31/2017 FINDINGS: CT CHEST FINDINGS Cardiovascular: Borderline heart size. No pericardial effusion. No evidence of great vessel injury. Mediastinum/Nodes: Negative for hematoma or pneumomediastinum. Lungs/Pleura: No hemothorax, pneumothorax, or lung contusion. Mild dependent atelectasis. Musculoskeletal: Medial left clavicle fracture with sclerosis, chronic appearing but without uniting callus. Remote and healed left humeral neck fracture. CT ABDOMEN PELVIS FINDINGS Hepatobiliary: No focal liver abnormality.Cholecystectomy with  normal common bile duct diameter. Pancreas: Marked atrophy. Spleen: Negative Adrenals/Urinary Tract: Negative adrenals. No hydronephrosis or stone. Patulous right ureter that is symmetric where covered on the delayed phase. No hydronephrosis. No evidence of renal or bladder injury. Stomach/Bowel: No evidence of injury. No inflammatory changes or obstruction Vascular/Lymphatic: No acute vascular finding. There is extensive atherosclerosis of the aorta, iliacs, and visceral branches. No mass or adenopathy. Reproductive:Hysterectomy Other: No ascites or pneumoperitoneum. Musculoskeletal: Vertical fracture through the right sacral ala traversing but nondisplaced along the anterior sacral foramina. There are transverse components superiorly and inferiorly with sclerosis. Both hips are located. Prominent osteopenia. Subcutaneous hematoma in the left gluteal region measuring up to 5 cm. Hyperdense area within this hematoma is most consistent with active bleeding. The delayed phases not cover this level. When numbered from above there are 4 lumbar type vertebral bodies. Remote T11, T12, and L1 compression fractures with T12 and L1 cement augmentation. Mild retropulsion at T11 and T12 without visible cord impingement. No acute thoracic or lumbar spine fracture is seen. Critical Value/emergent results were called by telephone at the time of interpretation on 07/01/2018 at 2:49 pm to Dr. Laban Emperor , who verbally acknowledged these results. IMPRESSION: 1. 5 cm subcutaneous left gluteal hematoma with active hemorrhage. 2. Acute or subacute nondisplaced vertical and transverse sacrum fractures. 3. No evidence of intrathoracic or intra-abdominal injury. 4. Multiple chronic/incidental findings noted above. Electronically Signed   By: Monte Fantasia M.D.   On: 07/01/2018 14:51   Ct Abdomen Pelvis W Contrast  Result Date: 07/01/2018 CLINICAL DATA:  Pelvic fracture, known or suspected.  Fall today. EXAM: CT CHEST, ABDOMEN, AND  PELVIS WITH CONTRAST TECHNIQUE: Multidetector CT imaging of the chest, abdomen and pelvis was performed following the standard protocol during bolus administration of intravenous contrast. CONTRAST:  31mL OMNIPAQUE IOHEXOL 300 MG/ML  SOLN COMPARISON:  Abdomen and pelvis CT 07/31/2017 FINDINGS: CT CHEST FINDINGS Cardiovascular: Borderline heart size. No pericardial effusion. No evidence of great vessel injury. Mediastinum/Nodes: Negative for hematoma or pneumomediastinum. Lungs/Pleura: No hemothorax, pneumothorax, or lung contusion. Mild dependent atelectasis. Musculoskeletal: Medial left clavicle fracture with sclerosis, chronic appearing but without uniting callus. Remote and healed left humeral neck fracture. CT ABDOMEN PELVIS FINDINGS Hepatobiliary: No focal liver abnormality.Cholecystectomy with normal common bile duct diameter. Pancreas: Marked atrophy. Spleen: Negative Adrenals/Urinary Tract: Negative adrenals. No hydronephrosis or stone. Patulous right ureter that is symmetric where covered on the delayed phase. No hydronephrosis. No evidence of renal or bladder injury. Stomach/Bowel: No evidence of injury. No inflammatory changes or obstruction Vascular/Lymphatic: No acute vascular finding. There is extensive atherosclerosis of the aorta, iliacs, and visceral branches. No mass or adenopathy. Reproductive:Hysterectomy Other: No ascites or pneumoperitoneum. Musculoskeletal: Vertical fracture through the right sacral ala traversing but nondisplaced along the anterior sacral foramina. There are  transverse components superiorly and inferiorly with sclerosis. Both hips are located. Prominent osteopenia. Subcutaneous hematoma in the left gluteal region measuring up to 5 cm. Hyperdense area within this hematoma is most consistent with active bleeding. The delayed phases not cover this level. When numbered from above there are 4 lumbar type vertebral bodies. Remote T11, T12, and L1 compression fractures with T12 and  L1 cement augmentation. Mild retropulsion at T11 and T12 without visible cord impingement. No acute thoracic or lumbar spine fracture is seen. Critical Value/emergent results were called by telephone at the time of interpretation on 07/01/2018 at 2:49 pm to Dr. Laban Emperor , who verbally acknowledged these results. IMPRESSION: 1. 5 cm subcutaneous left gluteal hematoma with active hemorrhage. 2. Acute or subacute nondisplaced vertical and transverse sacrum fractures. 3. No evidence of intrathoracic or intra-abdominal injury. 4. Multiple chronic/incidental findings noted above. Electronically Signed   By: Monte Fantasia M.D.   On: 07/01/2018 14:51    ____________________________________________    PROCEDURES  Procedure(s) performed:    Procedures    Medications  acetaminophen (TYLENOL) tablet 650 mg (650 mg Oral Given 07/01/18 1409)  iohexol (OMNIPAQUE) 300 MG/ML solution 75 mL (75 mLs Intravenous Contrast Given 07/01/18 1426)     ____________________________________________   INITIAL IMPRESSION / ASSESSMENT AND PLAN / ED COURSE  Pertinent labs & imaging results that were available during my care of the patient were reviewed by me and considered in my medical decision making (see chart for details).  Review of the [place] CSRS was performed in accordance of the [place] prior to dispensing any controlled drugs.   Patient presents emergency department for evaluation after mechanical fall.  Patient is alert and oriented.  She denies hitting her head or losing consciousness.  Chest and pelvis x-rays were ordered to initially evaluate falls while CTs and lab work were pending.  X-rays were negative for acute processes.  CT shows a 5 cm hematoma to left buttocks with active hemorrhage and acute sacral fractures.  Results were discussed with Dr. Rip Harbour and Dr. Dahlia Byes, who recommended transport to a trauma facility.  Dr. Rip Harbour does not recommend any additional interventions at this time.  [name] it  was called and report was given to Dr. Toney Rakes, who accepts patient to facility.       ____________________________________________  FINAL CLINICAL IMPRESSION(S) / ED DIAGNOSES  Final diagnoses:  Hematoma  Active bleeding  Closed fracture of sacrum, unspecified portion of sacrum, initial encounter ([place])  Laceration of left forearm, initial encounter      NEW MEDICATIONS STARTED DURING THIS VISIT:  ED Discharge Orders    None          This chart was dictated using voice recognition software/Dragon. Despite best efforts to proofread, errors can occur which can change the meaning. Any change was purely unintentional.    Laban Emperor, PA-C 07/01/18 1724    Nena Polio, MD 07/02/18 2045

## 2018-07-01 NOTE — ED Notes (Signed)
Brief report given Merry Proud from [employer]. Per Merry Proud they are working on transportation and it could be few hours. They will call back when they have more information.

## 2018-07-02 DIAGNOSIS — W19XXXA Unspecified fall, initial encounter: Secondary | ICD-10-CM | POA: Diagnosis not present

## 2018-07-02 DIAGNOSIS — D72820 Lymphocytosis (symptomatic): Secondary | ICD-10-CM | POA: Diagnosis not present

## 2018-07-02 DIAGNOSIS — D599 Acquired hemolytic anemia, unspecified: Secondary | ICD-10-CM | POA: Diagnosis present

## 2018-07-02 DIAGNOSIS — S32111A Minimally displaced Zone I fracture of sacrum, initial encounter for closed fracture: Secondary | ICD-10-CM | POA: Diagnosis not present

## 2018-07-02 DIAGNOSIS — R296 Repeated falls: Secondary | ICD-10-CM | POA: Insufficient documentation

## 2018-07-02 DIAGNOSIS — S3993XA Unspecified injury of pelvis, initial encounter: Secondary | ICD-10-CM | POA: Diagnosis not present

## 2018-07-02 DIAGNOSIS — S300XXA Contusion of lower back and pelvis, initial encounter: Secondary | ICD-10-CM | POA: Diagnosis not present

## 2018-07-02 DIAGNOSIS — D631 Anemia in chronic kidney disease: Secondary | ICD-10-CM | POA: Diagnosis present

## 2018-07-02 DIAGNOSIS — I6529 Occlusion and stenosis of unspecified carotid artery: Secondary | ICD-10-CM | POA: Diagnosis present

## 2018-07-02 DIAGNOSIS — E538 Deficiency of other specified B group vitamins: Secondary | ICD-10-CM | POA: Diagnosis present

## 2018-07-02 DIAGNOSIS — M810 Age-related osteoporosis without current pathological fracture: Secondary | ICD-10-CM | POA: Diagnosis present

## 2018-07-02 DIAGNOSIS — M8580 Other specified disorders of bone density and structure, unspecified site: Secondary | ICD-10-CM | POA: Diagnosis not present

## 2018-07-02 DIAGNOSIS — K5900 Constipation, unspecified: Secondary | ICD-10-CM | POA: Diagnosis present

## 2018-07-02 DIAGNOSIS — I129 Hypertensive chronic kidney disease with stage 1 through stage 4 chronic kidney disease, or unspecified chronic kidney disease: Secondary | ICD-10-CM | POA: Diagnosis not present

## 2018-07-02 DIAGNOSIS — D630 Anemia in neoplastic disease: Secondary | ICD-10-CM | POA: Diagnosis present

## 2018-07-02 DIAGNOSIS — Z7189 Other specified counseling: Secondary | ICD-10-CM | POA: Diagnosis not present

## 2018-07-02 DIAGNOSIS — Z7409 Other reduced mobility: Secondary | ICD-10-CM | POA: Diagnosis not present

## 2018-07-02 DIAGNOSIS — J45909 Unspecified asthma, uncomplicated: Secondary | ICD-10-CM | POA: Diagnosis present

## 2018-07-02 DIAGNOSIS — I2510 Atherosclerotic heart disease of native coronary artery without angina pectoris: Secondary | ICD-10-CM | POA: Diagnosis not present

## 2018-07-02 DIAGNOSIS — M069 Rheumatoid arthritis, unspecified: Secondary | ICD-10-CM | POA: Diagnosis present

## 2018-07-02 DIAGNOSIS — E785 Hyperlipidemia, unspecified: Secondary | ICD-10-CM | POA: Diagnosis present

## 2018-07-02 DIAGNOSIS — Z23 Encounter for immunization: Secondary | ICD-10-CM | POA: Diagnosis not present

## 2018-07-02 DIAGNOSIS — E440 Moderate protein-calorie malnutrition: Secondary | ICD-10-CM | POA: Diagnosis present

## 2018-07-02 DIAGNOSIS — C91Z0 Other lymphoid leukemia not having achieved remission: Secondary | ICD-10-CM | POA: Diagnosis present

## 2018-07-02 DIAGNOSIS — R5381 Other malaise: Secondary | ICD-10-CM | POA: Diagnosis not present

## 2018-07-02 DIAGNOSIS — S3210XA Unspecified fracture of sacrum, initial encounter for closed fracture: Secondary | ICD-10-CM | POA: Diagnosis not present

## 2018-07-02 DIAGNOSIS — N183 Chronic kidney disease, stage 3 (moderate): Secondary | ICD-10-CM | POA: Diagnosis not present

## 2018-07-02 DIAGNOSIS — M47812 Spondylosis without myelopathy or radiculopathy, cervical region: Secondary | ICD-10-CM | POA: Diagnosis present

## 2018-07-02 DIAGNOSIS — G8911 Acute pain due to trauma: Secondary | ICD-10-CM | POA: Diagnosis not present

## 2018-07-02 DIAGNOSIS — E1122 Type 2 diabetes mellitus with diabetic chronic kidney disease: Secondary | ICD-10-CM | POA: Diagnosis present

## 2018-07-02 DIAGNOSIS — S50812A Abrasion of left forearm, initial encounter: Secondary | ICD-10-CM | POA: Diagnosis present

## 2018-07-02 DIAGNOSIS — E039 Hypothyroidism, unspecified: Secondary | ICD-10-CM | POA: Diagnosis present

## 2018-07-02 DIAGNOSIS — D563 Thalassemia minor: Secondary | ICD-10-CM | POA: Diagnosis present

## 2018-07-02 DIAGNOSIS — H547 Unspecified visual loss: Secondary | ICD-10-CM | POA: Diagnosis present

## 2018-07-02 DIAGNOSIS — T1490XA Injury, unspecified, initial encounter: Secondary | ICD-10-CM | POA: Diagnosis not present

## 2018-07-02 DIAGNOSIS — S3219XA Other fracture of sacrum, initial encounter for closed fracture: Secondary | ICD-10-CM | POA: Diagnosis present

## 2018-07-05 ENCOUNTER — Encounter
Admission: RE | Admit: 2018-07-05 | Discharge: 2018-07-05 | Disposition: A | Discharge status: Home / Self Care | Payer: Medicare Other | Source: Ambulatory Visit | Attending: Internal Medicine | Admitting: Internal Medicine

## 2018-07-06 ENCOUNTER — Other Ambulatory Visit: Payer: Self-pay | Admitting: Adult Health

## 2018-07-06 DIAGNOSIS — E785 Hyperlipidemia, unspecified: Secondary | ICD-10-CM | POA: Diagnosis not present

## 2018-07-06 DIAGNOSIS — S3210XS Unspecified fracture of sacrum, sequela: Secondary | ICD-10-CM | POA: Diagnosis not present

## 2018-07-06 DIAGNOSIS — I2510 Atherosclerotic heart disease of native coronary artery without angina pectoris: Secondary | ICD-10-CM | POA: Diagnosis not present

## 2018-07-06 DIAGNOSIS — R296 Repeated falls: Secondary | ICD-10-CM | POA: Diagnosis not present

## 2018-07-06 DIAGNOSIS — D72820 Lymphocytosis (symptomatic): Secondary | ICD-10-CM | POA: Diagnosis not present

## 2018-07-06 DIAGNOSIS — M6281 Muscle weakness (generalized): Secondary | ICD-10-CM | POA: Diagnosis not present

## 2018-07-06 DIAGNOSIS — M47816 Spondylosis without myelopathy or radiculopathy, lumbar region: Secondary | ICD-10-CM | POA: Diagnosis not present

## 2018-07-06 DIAGNOSIS — R1310 Dysphagia, unspecified: Secondary | ICD-10-CM | POA: Diagnosis not present

## 2018-07-06 DIAGNOSIS — D631 Anemia in chronic kidney disease: Secondary | ICD-10-CM | POA: Diagnosis not present

## 2018-07-06 DIAGNOSIS — I129 Hypertensive chronic kidney disease with stage 1 through stage 4 chronic kidney disease, or unspecified chronic kidney disease: Secondary | ICD-10-CM | POA: Diagnosis not present

## 2018-07-06 DIAGNOSIS — C91Z0 Other lymphoid leukemia not having achieved remission: Secondary | ICD-10-CM | POA: Diagnosis not present

## 2018-07-06 DIAGNOSIS — S32020D Wedge compression fracture of second lumbar vertebra, subsequent encounter for fracture with routine healing: Secondary | ICD-10-CM | POA: Diagnosis not present

## 2018-07-06 DIAGNOSIS — E538 Deficiency of other specified B group vitamins: Secondary | ICD-10-CM | POA: Diagnosis not present

## 2018-07-06 DIAGNOSIS — C9110 Chronic lymphocytic leukemia of B-cell type not having achieved remission: Secondary | ICD-10-CM | POA: Diagnosis not present

## 2018-07-06 DIAGNOSIS — S300XXS Contusion of lower back and pelvis, sequela: Secondary | ICD-10-CM | POA: Diagnosis not present

## 2018-07-06 DIAGNOSIS — I10 Essential (primary) hypertension: Secondary | ICD-10-CM | POA: Diagnosis not present

## 2018-07-06 DIAGNOSIS — I639 Cerebral infarction, unspecified: Secondary | ICD-10-CM | POA: Diagnosis not present

## 2018-07-06 DIAGNOSIS — D563 Thalassemia minor: Secondary | ICD-10-CM | POA: Diagnosis not present

## 2018-07-06 DIAGNOSIS — E039 Hypothyroidism, unspecified: Secondary | ICD-10-CM | POA: Diagnosis not present

## 2018-07-06 DIAGNOSIS — Z9989 Dependence on other enabling machines and devices: Secondary | ICD-10-CM | POA: Diagnosis not present

## 2018-07-06 DIAGNOSIS — S300XXD Contusion of lower back and pelvis, subsequent encounter: Secondary | ICD-10-CM | POA: Diagnosis not present

## 2018-07-06 DIAGNOSIS — N183 Chronic kidney disease, stage 3 (moderate): Secondary | ICD-10-CM | POA: Diagnosis not present

## 2018-07-06 DIAGNOSIS — I779 Disorder of arteries and arterioles, unspecified: Secondary | ICD-10-CM | POA: Diagnosis not present

## 2018-07-06 DIAGNOSIS — M069 Rheumatoid arthritis, unspecified: Secondary | ICD-10-CM | POA: Diagnosis not present

## 2018-07-06 DIAGNOSIS — E1122 Type 2 diabetes mellitus with diabetic chronic kidney disease: Secondary | ICD-10-CM | POA: Diagnosis not present

## 2018-07-06 DIAGNOSIS — S32111D Minimally displaced Zone I fracture of sacrum, subsequent encounter for fracture with routine healing: Secondary | ICD-10-CM | POA: Diagnosis not present

## 2018-07-06 DIAGNOSIS — Z8673 Personal history of transient ischemic attack (TIA), and cerebral infarction without residual deficits: Secondary | ICD-10-CM | POA: Diagnosis not present

## 2018-07-06 DIAGNOSIS — Z9181 History of falling: Secondary | ICD-10-CM | POA: Diagnosis not present

## 2018-07-06 DIAGNOSIS — M8008XD Age-related osteoporosis with current pathological fracture, vertebra(e), subsequent encounter for fracture with routine healing: Secondary | ICD-10-CM | POA: Diagnosis not present

## 2018-07-06 DIAGNOSIS — E43 Unspecified severe protein-calorie malnutrition: Secondary | ICD-10-CM | POA: Diagnosis not present

## 2018-07-06 MED ORDER — OXYCODONE HCL 5 MG PO TABS: 5.0000 mg | ORAL_TABLET | ORAL | 0 refills | Status: DC | PRN

## 2018-07-07 ENCOUNTER — Encounter: Payer: Self-pay | Admitting: Adult Health

## 2018-07-07 ENCOUNTER — Non-Acute Institutional Stay (SKILLED_NURSING_FACILITY): Payer: Medicare Other | Admitting: Adult Health

## 2018-07-07 DIAGNOSIS — E039 Hypothyroidism, unspecified: Secondary | ICD-10-CM | POA: Diagnosis not present

## 2018-07-07 DIAGNOSIS — C91Z0 Other lymphoid leukemia not having achieved remission: Secondary | ICD-10-CM | POA: Diagnosis not present

## 2018-07-07 DIAGNOSIS — E785 Hyperlipidemia, unspecified: Secondary | ICD-10-CM | POA: Diagnosis not present

## 2018-07-07 DIAGNOSIS — D631 Anemia in chronic kidney disease: Secondary | ICD-10-CM

## 2018-07-07 DIAGNOSIS — C9110 Chronic lymphocytic leukemia of B-cell type not having achieved remission: Secondary | ICD-10-CM

## 2018-07-07 DIAGNOSIS — S32111D Minimally displaced Zone I fracture of sacrum, subsequent encounter for fracture with routine healing: Secondary | ICD-10-CM | POA: Diagnosis not present

## 2018-07-07 DIAGNOSIS — N183 Chronic kidney disease, stage 3 unspecified: Secondary | ICD-10-CM

## 2018-07-07 DIAGNOSIS — Z9989 Dependence on other enabling machines and devices: Secondary | ICD-10-CM | POA: Insufficient documentation

## 2018-07-07 DIAGNOSIS — S300XXS Contusion of lower back and pelvis, sequela: Secondary | ICD-10-CM | POA: Diagnosis not present

## 2018-07-07 DIAGNOSIS — R296 Repeated falls: Secondary | ICD-10-CM | POA: Diagnosis not present

## 2018-07-07 DIAGNOSIS — I639 Cerebral infarction, unspecified: Secondary | ICD-10-CM | POA: Diagnosis not present

## 2018-07-07 DIAGNOSIS — E43 Unspecified severe protein-calorie malnutrition: Secondary | ICD-10-CM

## 2018-07-07 DIAGNOSIS — I10 Essential (primary) hypertension: Secondary | ICD-10-CM

## 2018-07-07 DIAGNOSIS — S3210XS Unspecified fracture of sacrum, sequela: Secondary | ICD-10-CM

## 2018-07-11 DIAGNOSIS — E785 Hyperlipidemia, unspecified: Secondary | ICD-10-CM | POA: Insufficient documentation

## 2018-07-11 DIAGNOSIS — S3210XA Unspecified fracture of sacrum, initial encounter for closed fracture: Secondary | ICD-10-CM | POA: Insufficient documentation

## 2018-07-11 DIAGNOSIS — E039 Hypothyroidism, unspecified: Secondary | ICD-10-CM | POA: Insufficient documentation

## 2018-07-11 DIAGNOSIS — N183 Chronic kidney disease, stage 3 unspecified: Secondary | ICD-10-CM | POA: Insufficient documentation

## 2018-07-11 DIAGNOSIS — S300XXA Contusion of lower back and pelvis, initial encounter: Secondary | ICD-10-CM | POA: Insufficient documentation

## 2018-07-11 NOTE — Progress Notes (Signed)
Location:   [place] Room Number: 212 A Place of Service:  SNF (31)   CODE STATUS: full code   Allergies  Allergen Reactions  . Latex Rash  . Meperidine     Other reaction(s): Other (See Comments) Other Reaction: CNS Disorder  . Penicillins Other (See Comments)    Has patient had a PCN reaction causing immediate rash, facial/tongue/throat swelling, SOB or lightheadedness with hypotension: Unknown Has patient had a PCN reaction causing severe rash involving mucus membranes or skin necrosis: Unknown Has patient had a PCN reaction that required hospitalization: Unknown Has patient had a PCN reaction occurring within the last 10 years: Unknown If all of the above answers are "NO", then may proceed with Cephalosporin use.   . Cefuroxime Axetil Nausea And Vomiting  . Codeine Nausea And Vomiting and Nausea Only  . Propoxyphene Nausea Only    Other reaction(s): Vomiting    Chief Complaint  Patient presents with  . Acute Visit    ER Follow up    HPI:  She is an 84 year old woman who suffered a fall at home and has a left buttock hematoma and sacral fracture. She is here for short term rehab with her to return back home. She is having pain from her hematoma and fracture. She does not like oxycodone would like to take tylenol routinely. She denies any changes in appetite; no dizziness or weakness. She will continue to be followed for her chronic illnesses including: hypertension; cva; hypothyroidism.     Past Medical History:  Diagnosis Date  . Acute bronchiolitis   . Acute pharyngitis   . Allergic rhinitis due to pollen   . Anemia, unspecified    CKD and LGL  . Anxiety state    unspecified  . Arthritis    "knees, legs" (03/18/2018)  . Asthma without status asthmaticus    unspecified  . B12 deficiency   . Beta thalassemia trait   . Carotid artery occlusion   . Cellulitis and abscess of leg, except foot   . Cervical spondylosis   . Cervicalgia   . Chronic  kidney disease   . Chronic lower back pain   . CML (chronic myelocytic leukemia) ([place])    Onc at [facility]  . Complication of anesthesia    "last back surgery they liked to never get me awake" (03/18/2018)  . Coronary artery disease 2009   heart attack with stent  . Coronary atherosclerosis of native coronary artery   . Depressive disorder    not elsewhere classified  . Difficulty in walking   . Disorder of breast, unspecified   . Dizziness and giddiness   . Dysuria   . Esophageal reflux   . Essential hypertension    unspecified  . Family history of adverse reaction to anesthesia    "liked to never get my sister awake" (03/18/2018)  . Head injury    unspecified  . Headache    "q time I have a stroke" (03/18/2018)  . Heart disease   . Hematuria, unspecified   . History of blood transfusion    "had 22 when they found out I had leukemia" (03/18/2018)  . Hypersomnia, unspecified   . Hypertension   . Hypopotassemia   . Hypothyroidism   . ICH (intracerebral hemorrhage) ([place])   . Ill-defined cerebrovascular disease    other  . Insomnia    unspecified  . Large granular lymphocyte disorder ([place]) 12/2001  . Leukemia ([place])   . Lower urinary  tract infection   . Lumbago   . Mini stroke ([place])    x years  . Mixed hyperlipidemia   . Myocardial infarction ([place]) ~2017  . Nontoxic nodular goiter    unspecified  . Occlusion and stenosis of unspecified carotid artery    without mention of cerebral infarction  . Osteoarthritis   . Osteoporosis   . Other constipation   . Other vitamin B12 deficiency anemias   . Otitis media, unspecified, unspecified ear   . Ovarian failure    unspecified  . Pain in limb   . Panic disorder without agoraphobia   . Peripheral vascular disease ([place])    unspecified  . Phlebitis of left arm   . RA (rheumatoid arthritis) ([place])   . Sigmoid polyp 1998  . Sleep disturbance    unspecified  . Stroke ([place]) 02/2017   "lots of mini strokes; big one 02/2017; that  one made me weak in my knees,; never fully recovered" (03/18/2018)  . Syncope and collapse   . Thalassemia   . TIA (transient ischemic attack)    2000 and 2008 right carotid stent 08/14/2009 on Plavix  . TIA (transient ischemic attack) 03/18/2018  . Transient disorder of initiating or maintaining sleep   . Type II diabetes mellitus ([place])   . Varicose veins of bilateral lower extremities with other complications   . Varicose veins of bilateral lower extremities with other complications   . Vision abnormalities     Past Surgical History:  Procedure Laterality Date  . BACK SURGERY    . CAROTID ENDARTERECTOMY Left   . CAROTID STENT INSERTION Right    "have 2 stents in there" (03/18/2018)  . CATARACT EXTRACTION W/ INTRAOCULAR LENS  IMPLANT, BILATERAL Bilateral 06/16/2001 - 05/28/2003   +23.5D     22.5D  . CHOLECYSTECTOMY OPEN  1980  . COLONOSCOPY  2010  . CORONARY ANGIOPLASTY WITH STENT PLACEMENT    . EYELID SURGERY Bilateral 2005   BUL BLEPH  . FRACTURE SURGERY    . JOINT REPLACEMENT    . KYPHOPLASTY N/A 08/12/2017   Procedure: KYPHOPLASTY;  Surgeon: Hessie Knows, MD;  Location: [facility] ORS;  Service: Orthopedics;  Laterality: N/A;  . KYPHOPLASTY N/A 11/08/2017   Procedure: Hewitt Shorts;  Surgeon: Hessie Knows, MD;  Location: [facility] ORS;  Service: Orthopedics;  Laterality: N/A;  . PERCUTANEOUS PLACEMENT INTRAVASCULAR STENT CERVICAL CAROTID ARTERY  06/14/2009  . ROTATOR CUFF REPAIR Left   . TOTAL ABDOMINAL HYSTERECTOMY  1978   WITH REMOVAL TUBES & /OR OVARIES  . TOTAL KNEE ARTHROPLASTY Right     Social History   Socioeconomic History  . Marital status: Married    Spouse name: Not on file  . Number of children: Not on file  . Years of education: Not on file  . Highest education level: Not on file  Occupational History  . Not on file  Social Needs  . Financial resource strain: Not on file  . Food insecurity:    Worry: Not on file    Inability: Not on file  . Transportation  needs:    Medical: Not on file    Non-medical: Not on file  Tobacco Use  . Smoking status: Never Smoker  . Smokeless tobacco: Never Used  Substance and Sexual Activity  . Alcohol use: No  . Drug use: No  . Sexual activity: Never  Lifestyle  . Physical activity:    Days per week: Not on file    Minutes per session: Not on file  .  Stress: Not on file  Relationships  . Social connections:    Talks on phone: Not on file    Gets together: Not on file    Attends religious service: Not on file    Active member of club or organization: Not on file    Attends meetings of clubs or organizations: Not on file    Relationship status: Not on file  . Intimate partner violence:    Fear of current or ex partner: Not on file    Emotionally abused: Not on file    Physically abused: Not on file    Forced sexual activity: Not on file  Other Topics Concern  . Not on file  Social History Narrative  . Not on file   Family History  Problem Relation Age of Onset  . Acute myelogenous leukemia Brother   . Anemia Brother   . Coronary artery disease Brother   . Heart disease Brother   . Stroke Brother   . Heart failure Brother   . Stroke Mother   . Anemia Mother   . Heart disease Mother   . Heart failure Mother   . Hypertension Mother   . Osteoarthritis Mother   . Rheum arthritis Mother   . Heart attack Father   . Anemia Sister   . Cataracts Sister   . Osteoarthritis Sister   . Rheum arthritis Sister   . Stroke Sister   . Heart disease Sister   . Thalassemia Sister   . Thalassemia Sister   . Blindness Neg Hx   . Glaucoma Neg Hx   . Macular degeneration Neg Hx   . Strabismus Neg Hx   . Vision loss Neg Hx   . Basal cell carcinoma Neg Hx   . GU problems Neg Hx   . Kidney cancer Neg Hx   . Melanoma Neg Hx   . Kidney disease Neg Hx   . Prostate cancer Neg Hx   . Squamous cell carcinoma Neg Hx       VITAL SIGNS BP (!) 135/49   Pulse 70   Temp 97.9 F (36.6 C)   Resp 18   Ht  5' (1.524 m)   Wt 103 lb 9.6 oz (47 kg)   SpO2 100%   BMI 20.23 kg/m   Outpatient Encounter Medications as of 07/07/2018  Medication Sig  . acetaminophen (TYLENOL) 325 MG tablet Take 2 tablets (650 mg total) by mouth every 6 (six) hours as needed for mild pain (or Fever >/= 101).  Marland Kitchen ascorbic acid (VITAMIN C) 1000 MG tablet Take 1,000 mg by mouth daily.  Marland Kitchen aspirin 81 MG EC tablet Take 81 mg by mouth daily.   Marland Kitchen atorvastatin (LIPITOR) 20 MG tablet Take 1 tablet (20 mg total) by mouth daily at 6 PM.  . carvedilol (COREG) 3.125 MG tablet Take 1 tablet (3.125 mg total) by mouth 2 (two) times daily with a meal.  . cholecalciferol (VITAMIN D) 1000 UNITS tablet Take 1,000 Units by mouth daily.  . cyanocobalamin (,VITAMIN B-12,) 1000 MCG/ML injection Inject 1,000 mcg into the muscle every 30 (thirty) days. At the 1st of the month - GIVING EARLY AT PT REQUEST- PT MISSED THE INJECTION LAST MONTH  . folic acid (FOLVITE) 938 MCG tablet Take 400 mcg by mouth daily.  . hydrOXYzine (ATARAX/VISTARIL) 10 MG tablet Take 1 tablet (10 mg total) by mouth 3 (three) times daily as needed.  Marland Kitchen levothyroxine (SYNTHROID, LEVOTHROID) 50 MCG tablet Take 50 mcg by mouth daily.   Marland Kitchen  lisinopril (PRINIVIL,ZESTRIL) 5 MG tablet Take 1 tablet (5 mg total) by mouth daily.  . ondansetron (ZOFRAN ODT) 4 MG disintegrating tablet Take 1 tablet (4 mg total) by mouth every 8 (eight) hours as needed for nausea or vomiting.  Marland Kitchen oxyCODONE (ROXICODONE) 5 MG immediate release tablet Take 1 tablet (5 mg total) by mouth every 4 (four) hours as needed for severe pain. Take 1/2 or 1 tab every 4 hours as needed for pin  . polyethylene glycol (MIRALAX / GLYCOLAX) packet Take 17 g by mouth daily as needed.  . vitamin E 400 UNIT capsule Take 400 Units by mouth daily.   No facility-administered encounter medications on file as of 07/07/2018.      SIGNIFICANT DIAGNOSTIC EXAMS   LABS REVIEWED TODAY:   03-19-18: tsh 1.121 hgb a1c 5.0; chol 127;  ldl 71; trig 52; hdl 46 07-01-18: wbc  5.4; hgb 8.7 hct 29.0; mcv 63.7; mcv 63.7; plt  109; glucose 118; bun 25; creat 1.16; k+ 3.9; na++ 141; ca 9.1  Review of Systems  Constitutional: Negative for malaise/fatigue.  Respiratory: Negative for cough and shortness of breath.   Cardiovascular: Negative for chest pain, palpitations and leg swelling.  Gastrointestinal: Negative for abdominal pain, constipation and heartburn.  Musculoskeletal: Positive for myalgias. Negative for back pain and joint pain.       Has left buttock/hip pain   Skin:       Has bruising present left buttock   Neurological: Negative for dizziness.  Psychiatric/Behavioral: The patient is not nervous/anxious.   '  Physical Exam  Constitutional: She is oriented to person, place, and time. She appears well-developed and well-nourished. No distress.  Frail   Neck: No thyromegaly present.  Cardiovascular: Normal rate, regular rhythm, normal heart sounds and intact distal pulses.  Pulmonary/Chest: Effort normal and breath sounds normal. No respiratory distress.  Abdominal: Soft. Bowel sounds are normal. She exhibits no distension. There is no tenderness.  Musculoskeletal: She exhibits no edema.  Is able to move all extremities Has large hematoma left buttock  Significant bruising present left buttock   Lymphadenopathy:    She has no cervical adenopathy.  Neurological: She is alert and oriented to person, place, and time.  Skin: Skin is warm and dry. She is not diaphoretic.  Psychiatric: She has a normal mood and affect.      ASSESSMENT/ PLAN:  TODAY:   1. Essential hypertension; benign: is stable b/p 135/49: will continue lisinopril 5 mg daily and coreg 3.125 mg twice daily   2. Cerebrovascular accident (CVA); is neurologically stable will continue asa 81 mg daily  3. Acquired hypothyroidism: is stable tsh 1.121: will continue synthroid 50 mcg daily   4. Dyslipidemia: is stable ldl 71; will continue lipitor 20  mg daily   5. Protein calories malnutrition; severe: weight is 103 pounds; will continue supplements as directed  6.  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 Has not required recent blood transfusion wbc 5.4  7. Anemia, chronic renal failure stage 3 (moderate): is stable hgb 8.7 will monitor  8. Chronic renal disease stage III: is stable bun 25 creat 1.16  9. Lumbar compression fracture: is status post kyphoplasty 11-08-17. Will monitor   10, acute or subacute nondisplaced vertical and transverse sacrum fractures: is stable will continue therapy as directed; will continue oxycodone 2.5 or 5 mg every r hours as needed; will begin tylenol 1 gm three times daily  11. Left buttock hematoma ;is stable will monitor       MD is aware of resident's narcotic use and is in agreement with current plan of care. We will attempt to wean resident as apropriate   Ok Edwards NP [facility]  Contact [phone] Monday through Friday 8am- 5pm  After hours call [phone]

## 2018-07-12 ENCOUNTER — Encounter: Payer: Self-pay | Admitting: Nurse Practitioner

## 2018-07-12 ENCOUNTER — Non-Acute Institutional Stay (SKILLED_NURSING_FACILITY): Payer: Medicare Other | Admitting: Adult Health

## 2018-07-12 ENCOUNTER — Encounter: Payer: Self-pay | Admitting: Adult Health

## 2018-07-12 DIAGNOSIS — S32020D Wedge compression fracture of second lumbar vertebra, subsequent encounter for fracture with routine healing: Secondary | ICD-10-CM | POA: Diagnosis not present

## 2018-07-12 DIAGNOSIS — S300XXS Contusion of lower back and pelvis, sequela: Secondary | ICD-10-CM | POA: Diagnosis not present

## 2018-07-12 DIAGNOSIS — S3210XS Unspecified fracture of sacrum, sequela: Secondary | ICD-10-CM

## 2018-07-12 NOTE — Progress Notes (Signed)
Location:   [place] [facility] Room Number: [place] of Service:  SNF (31)   CODE STATUS: DNR  Allergies  Allergen Reactions  . Latex Rash  . Meperidine     Other reaction(s): Other (See Comments) Other Reaction: CNS Disorder  . Penicillins Other (See Comments)    Has patient had a PCN reaction causing immediate rash, facial/tongue/throat swelling, SOB or lightheadedness with hypotension: Unknown Has patient had a PCN reaction causing severe rash involving mucus membranes or skin necrosis: Unknown Has patient had a PCN reaction that required hospitalization: Unknown Has patient had a PCN reaction occurring within the last 10 years: Unknown If all of the above answers are "NO", then may proceed with Cephalosporin use.   . Cefuroxime Axetil Nausea And Vomiting  . Codeine Nausea And Vomiting and Nausea Only  . Propoxyphene Nausea Only    Other reaction(s): Vomiting    Chief Complaint  Patient presents with  . Acute Visit    Care Plan Meeting    HPI:  We have come together for her routine care plan meeting. She is here for short term rehab with her goal to return back home. She will not need any dme but will need home health. She is doing well with therapy. More than likely she will be going home one day next week. She denies any difficulty with uncontrolled pain; no changes in appetite; no insomnia no anxiety.   Past Medical History:  Diagnosis Date  . Acute bronchiolitis   . Acute pharyngitis   . Allergic rhinitis due to pollen   . Anemia, unspecified    CKD and LGL  . Anxiety state    unspecified  . Arthritis    "knees, legs" (03/18/2018)  . Asthma without status asthmaticus    unspecified  . B12 deficiency   . Beta thalassemia trait   . Carotid artery occlusion   . Cellulitis and abscess of leg, except foot   . Cervical spondylosis   . Cervicalgia   . Chronic kidney disease   . Chronic lower back pain   . CML (chronic myelocytic  leukemia) ([place])    Onc at [facility]  . Complication of anesthesia    "last back surgery they liked to never get me awake" (03/18/2018)  . Coronary artery disease 2009   heart attack with stent  . Coronary atherosclerosis of native coronary artery   . Depressive disorder    not elsewhere classified  . Difficulty in walking   . Disorder of breast, unspecified   . Dizziness and giddiness   . Dysuria   . Esophageal reflux   . Essential hypertension    unspecified  . Family history of adverse reaction to anesthesia    "liked to never get my sister awake" (03/18/2018)  . Head injury    unspecified  . Headache    "q time I have a stroke" (03/18/2018)  . Heart disease   . Hematuria, unspecified   . History of blood transfusion    "had 22 when they found out I had leukemia" (03/18/2018)  . Hypersomnia, unspecified   . Hypertension   . Hypopotassemia   . Hypothyroidism   . ICH (intracerebral hemorrhage) ([place])   . Ill-defined cerebrovascular disease    other  . Insomnia    unspecified  . 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 Leukemia ([place])   . Lower urinary tract infection   . Lumbago   . Mini stroke ([place])  x years  . Mixed hyperlipidemia   . Myocardial infarction ([place]) ~2017  . Nontoxic nodular goiter    unspecified  . Occlusion and stenosis of unspecified carotid artery    without mention of cerebral infarction  . Osteoarthritis   . Osteoporosis   . Other constipation   . Other vitamin B12 deficiency anemias   . Otitis media, unspecified, unspecified ear   . Ovarian failure    unspecified  . Pain in limb   . Panic disorder without agoraphobia   . Peripheral vascular disease ([place])    unspecified  . Phlebitis of left arm   . RA (rheumatoid arthritis) ([place])   . Sigmoid polyp 1998  . Sleep disturbance    unspecified  . Stroke ([place]) 02/2017   "lots of mini strokes; big one 02/2017; that one made me weak in my knees,; never fully recovered" (03/18/2018)  .  Syncope and collapse   . Thalassemia   . TIA (transient ischemic attack)    2000 and 2008 right carotid stent 08/14/2009 on Plavix  . TIA (transient ischemic attack) 03/18/2018  . Transient disorder of initiating or maintaining sleep   . Type II diabetes mellitus ([place])   . Varicose veins of bilateral lower extremities with other complications   . Varicose veins of bilateral lower extremities with other complications   . Vision abnormalities     Past Surgical History:  Procedure Laterality Date  . BACK SURGERY    . CAROTID ENDARTERECTOMY Left   . CAROTID STENT INSERTION Right    "have 2 stents in there" (03/18/2018)  . CATARACT EXTRACTION W/ INTRAOCULAR LENS  IMPLANT, BILATERAL Bilateral 06/16/2001 - 05/28/2003   +23.5D     22.5D  . CHOLECYSTECTOMY OPEN  1980  . COLONOSCOPY  2010  . CORONARY ANGIOPLASTY WITH STENT PLACEMENT    . EYELID SURGERY Bilateral 2005   BUL BLEPH  . FRACTURE SURGERY    . JOINT REPLACEMENT    . KYPHOPLASTY N/A 08/12/2017   Procedure: KYPHOPLASTY;  Surgeon: Hessie Knows, MD;  Location: [facility] ORS;  Service: Orthopedics;  Laterality: N/A;  . KYPHOPLASTY N/A 11/08/2017   Procedure: Hewitt Shorts;  Surgeon: Hessie Knows, MD;  Location: [facility] ORS;  Service: Orthopedics;  Laterality: N/A;  . PERCUTANEOUS PLACEMENT INTRAVASCULAR STENT CERVICAL CAROTID ARTERY  06/14/2009  . ROTATOR CUFF REPAIR Left   . TOTAL ABDOMINAL HYSTERECTOMY  1978   WITH REMOVAL TUBES & /OR OVARIES  . TOTAL KNEE ARTHROPLASTY Right     Social History   Socioeconomic History  . Marital status: Married    Spouse name: Not on file  . Number of children: Not on file  . Years of education: Not on file  . Highest education level: Not on file  Occupational History  . Not on file  Social Needs  . Financial resource strain: Not on file  . Food insecurity:    Worry: Not on file    Inability: Not on file  . Transportation needs:    Medical: Not on file    Non-medical: Not on file  Tobacco  Use  . Smoking status: Never Smoker  . Smokeless tobacco: Never Used  Substance and Sexual Activity  . Alcohol use: No  . Drug use: No  . Sexual activity: Never  Lifestyle  . Physical activity:    Days per week: Not on file    Minutes per session: Not on file  . Stress: Not on file  Relationships  . Social connections:  Talks on phone: Not on file    Gets together: Not on file    Attends religious service: Not on file    Active member of club or organization: Not on file    Attends meetings of clubs or organizations: Not on file    Relationship status: Not on file  . Intimate partner violence:    Fear of current or ex partner: Not on file    Emotionally abused: Not on file    Physically abused: Not on file    Forced sexual activity: Not on file  Other Topics Concern  . Not on file  Social History Narrative  . Not on file   Family History  Problem Relation Age of Onset  . Acute myelogenous leukemia Brother   . Anemia Brother   . Coronary artery disease Brother   . Heart disease Brother   . Stroke Brother   . Heart failure Brother   . Stroke Mother   . Anemia Mother   . Heart disease Mother   . Heart failure Mother   . Hypertension Mother   . Osteoarthritis Mother   . Rheum arthritis Mother   . Heart attack Father   . Anemia Sister   . Cataracts Sister   . Osteoarthritis Sister   . Rheum arthritis Sister   . Stroke Sister   . Heart disease Sister   . Thalassemia Sister   . Thalassemia Sister   . Blindness Neg Hx   . Glaucoma Neg Hx   . Macular degeneration Neg Hx   . Strabismus Neg Hx   . Vision loss Neg Hx   . Basal cell carcinoma Neg Hx   . GU problems Neg Hx   . Kidney cancer Neg Hx   . Melanoma Neg Hx   . Kidney disease Neg Hx   . Prostate cancer Neg Hx   . Squamous cell carcinoma Neg Hx       VITAL SIGNS BP (!) 144/52   Pulse 83   Temp 97.9 F (36.6 C)   Resp 20   Ht 5' (1.524 m)   Wt 104 lb (47.2 kg)   SpO2 100%   BMI 20.31 kg/m    Outpatient Encounter Medications as of 07/12/2018  Medication Sig  . acetaminophen (TYLENOL) 500 MG tablet Take 1,000 mg by mouth every 8 (eight) hours.  Marland Kitchen ascorbic acid (VITAMIN C) 1000 MG tablet Take 1,000 mg by mouth daily.  Marland Kitchen aspirin 325 MG tablet Take 325 mg by mouth daily.   Marland Kitchen atorvastatin (LIPITOR) 20 MG tablet Take 1 tablet (20 mg total) by mouth daily at 6 PM.  . carvedilol (COREG) 3.125 MG tablet Take 1 tablet (3.125 mg total) by mouth 2 (two) times daily with a meal.  . cholecalciferol (VITAMIN D) 1000 UNITS tablet Take 4,000 Units by mouth daily.   . cyanocobalamin (,VITAMIN B-12,) 1000 MCG/ML injection Inject 1,000 mcg into the muscle every 30 (thirty) days. At the 1st of the month - GIVING EARLY AT PT REQUEST- PT MISSED THE INJECTION LAST MONTH  . folic acid (FOLVITE) 245 MCG tablet Take 400 mcg by mouth daily.  Marland Kitchen levothyroxine (SYNTHROID, LEVOTHROID) 50 MCG tablet Take 50 mcg by mouth daily.   . Melatonin 3 MG TABS Take 1 tablet by mouth at bedtime.  . methocarbamol (ROBAXIN) 750 MG tablet Take 750 mg by mouth 3 (three) times daily.  . NON FORMULARY Diet type:  NAS, NCS  . ondansetron (ZOFRAN ODT) 4 MG disintegrating tablet  Take 1 tablet (4 mg total) by mouth every 8 (eight) hours as needed for nausea or vomiting.  Marland Kitchen oxycodone (OXY-IR) 5 MG capsule Take 1/2 tablet for moderate pain and 1 tablet for severe pain  . polyethylene glycol (MIRALAX / GLYCOLAX) packet Take 17 g by mouth daily.   . sennosides-docusate sodium (SENOKOT-S) 8.6-50 MG tablet Take 2 tablets by mouth 2 (two) times daily.  . solifenacin (VESICARE) 5 MG tablet Take 5 mg by mouth daily.  . vitamin E 400 UNIT capsule Take 400 Units by mouth daily.  . [DISCONTINUED] acetaminophen (TYLENOL) 325 MG tablet Take 2 tablets (650 mg total) by mouth every 6 (six) hours as needed for mild pain (or Fever >/= 101). (Patient not taking: Reported on 07/12/2018)  . [DISCONTINUED] hydrOXYzine (ATARAX/VISTARIL) 10 MG tablet Take  1 tablet (10 mg total) by mouth 3 (three) times daily as needed. (Patient not taking: Reported on 07/12/2018)  . [DISCONTINUED] lisinopril (PRINIVIL,ZESTRIL) 5 MG tablet Take 1 tablet (5 mg total) by mouth daily. (Patient not taking: Reported on 07/12/2018)  . [DISCONTINUED] oxyCODONE (ROXICODONE) 5 MG immediate release tablet Take 1 tablet (5 mg total) by mouth every 4 (four) hours as needed for severe pain. Take 1/2 or 1 tab every 4 hours as needed for pin (Patient not taking: Reported on 07/12/2018)   No facility-administered encounter medications on file as of 07/12/2018.      SIGNIFICANT DIAGNOSTIC EXAMS  LABS REVIEWED PREVIOUS:   03-19-18: tsh 1.121 hgb a1c 5.0; chol 127; ldl 71; trig 52; hdl 46 07-01-18: wbc  5.4; hgb 8.7 hct 29.0; mcv 63.7; mcv 63.7; plt  109; glucose 118; bun 25; creat 1.16; k+ 3.9; na++ 141; ca 9.1  NO NEW LABS.    Review of Systems  Constitutional: Negative for malaise/fatigue.  Respiratory: Negative for cough and shortness of breath.   Cardiovascular: Negative for chest pain, palpitations and leg swelling.  Gastrointestinal: Negative for abdominal pain, constipation and heartburn.  Musculoskeletal: Negative for back pain, joint pain and myalgias.  Skin: Negative.   Neurological: Negative for dizziness.  Psychiatric/Behavioral: The patient is not nervous/anxious.     Physical Exam  Constitutional: She is oriented to person, place, and time. She appears well-developed and well-nourished. No distress.  Frail   Neck: No thyromegaly present.  Cardiovascular: Normal rate, regular rhythm, normal heart sounds and intact distal pulses.  Pulmonary/Chest: Effort normal and breath sounds normal. No respiratory distress.  Abdominal: Soft. Bowel sounds are normal. She exhibits no distension. There is no tenderness.  Musculoskeletal: She exhibits no edema.  Is able to move all extremities Has large hematoma left buttock  Significant bruising present left buttock   Lymphadenopathy:    She has no cervical adenopathy.  Neurological: She is alert and oriented to person, place, and time.  Skin: Skin is warm and dry. She is not diaphoretic.  Psychiatric: She has a normal mood and affect.      ASSESSMENT/ PLAN:  TODAY:   1.  Lumbar compression fracture: is status post kyphoplasty 11-08-17.  2.  Acute or subacute nondisplaced vertical and transverse sacrum fractures:  3. Left buttock hematoma ;is stable will monitor   Will continue therapy as directed Will continue current medications Will continue to monitor her status Her goal remains to return home.   MD is aware of resident's narcotic use and is in agreement with current plan of care. We will attempt to wean resident as apropriate   Ok Edwards NP [facility]  Contact [phone] Monday through Friday 8am- 5pm  After hours call [phone]

## 2018-07-14 DIAGNOSIS — D631 Anemia in chronic kidney disease: Secondary | ICD-10-CM | POA: Diagnosis not present

## 2018-07-14 DIAGNOSIS — N183 Chronic kidney disease, stage 3 (moderate): Secondary | ICD-10-CM | POA: Diagnosis not present

## 2018-07-14 DIAGNOSIS — D72820 Lymphocytosis (symptomatic): Secondary | ICD-10-CM | POA: Diagnosis not present

## 2018-07-15 ENCOUNTER — Non-Acute Institutional Stay (SKILLED_NURSING_FACILITY): Payer: Medicare Other | Admitting: Adult Health

## 2018-07-15 ENCOUNTER — Other Ambulatory Visit: Payer: Self-pay | Admitting: Adult Health

## 2018-07-15 ENCOUNTER — Encounter: Payer: Self-pay | Admitting: Adult Health

## 2018-07-15 DIAGNOSIS — S3210XS Unspecified fracture of sacrum, sequela: Secondary | ICD-10-CM

## 2018-07-15 DIAGNOSIS — S300XXS Contusion of lower back and pelvis, sequela: Secondary | ICD-10-CM | POA: Diagnosis not present

## 2018-07-15 DIAGNOSIS — I10 Essential (primary) hypertension: Secondary | ICD-10-CM

## 2018-07-15 DIAGNOSIS — I639 Cerebral infarction, unspecified: Secondary | ICD-10-CM | POA: Diagnosis not present

## 2018-07-15 MED ORDER — SOLIFENACIN SUCCINATE 5 MG PO TABS: 5.0000 mg | ORAL_TABLET | Freq: Every day | ORAL | 0 refills | Status: DC

## 2018-07-15 MED ORDER — LEVOTHYROXINE SODIUM 50 MCG PO TABS: 50.0000 ug | ORAL_TABLET | Freq: Every day | ORAL | 0 refills | Status: DC

## 2018-07-15 MED ORDER — OXYCODONE HCL 5 MG PO TABA: 2.5000 mg | ORAL_TABLET | Freq: Four times a day (QID) | ORAL | 0 refills | Status: DC | PRN

## 2018-07-15 MED ORDER — ONDANSETRON 4 MG PO TBDP: 4.0000 mg | ORAL_TABLET | Freq: Three times a day (TID) | ORAL | 0 refills | Status: DC | PRN

## 2018-07-15 MED ORDER — METHOCARBAMOL 750 MG PO TABS: 750.0000 mg | ORAL_TABLET | Freq: Three times a day (TID) | ORAL | 0 refills | Status: DC

## 2018-07-15 MED ORDER — CARVEDILOL 3.125 MG PO TABS: 3.1250 mg | ORAL_TABLET | Freq: Two times a day (BID) | ORAL | 0 refills | Status: DC

## 2018-07-15 MED ORDER — ATORVASTATIN CALCIUM 20 MG PO TABS: 20.0000 mg | ORAL_TABLET | Freq: Every day | ORAL | 0 refills | Status: DC

## 2018-07-15 NOTE — Progress Notes (Signed)
Location:   [facility] Room Number: [place] of Service:  SNF (31)    CODE STATUS: DNR  Allergies  Allergen Reactions  . Latex Rash  . Meperidine     Other reaction(s): Other (See Comments) Other Reaction: CNS Disorder  . Penicillins Other (See Comments)    Has patient had a PCN reaction causing immediate rash, facial/tongue/throat swelling, SOB or lightheadedness with hypotension: Unknown Has patient had a PCN reaction causing severe rash involving mucus membranes or skin necrosis: Unknown Has patient had a PCN reaction that required hospitalization: Unknown Has patient had a PCN reaction occurring within the last 10 years: Unknown If all of the above answers are "NO", then may proceed with Cephalosporin use.   . Cefuroxime Axetil Nausea And Vomiting  . Codeine Nausea And Vomiting and Nausea Only  . Propoxyphene Nausea Only    Other reaction(s): Vomiting    Chief Complaint  Patient presents with  . Discharge Note    Discharging to home on 07/19/18    HPI:  She is being discharged to home with home health for pt/ot/rn. She will not need any dme; as she has all needed equipment. She will need her prescriptions written and will need to follow up with her medical provider. She had been hospitalized after a fall suffering a left buttock hematoma and sacral fractures. She was admitted to this facility for short term rehab and is ready for discharge to home.    Past Medical History:  Diagnosis Date  . Acute bronchiolitis   . Acute pharyngitis   . Allergic rhinitis due to pollen   . Anemia, unspecified    CKD and LGL  . Anxiety state    unspecified  . Arthritis    "knees, legs" (03/18/2018)  . Asthma without status asthmaticus    unspecified  . B12 deficiency   . Beta thalassemia trait   . Carotid artery occlusion   . Cellulitis and abscess of leg, except foot   . Cervical spondylosis   . Cervicalgia   . Chronic kidney disease   .  Chronic lower back pain   . CML (chronic myelocytic leukemia) ([place])    Onc at [facility]  . Complication of anesthesia    "last back surgery they liked to never get me awake" (03/18/2018)  . Coronary artery disease 2009   heart attack with stent  . Coronary atherosclerosis of native coronary artery   . Depressive disorder    not elsewhere classified  . Difficulty in walking   . Disorder of breast, unspecified   . Dizziness and giddiness   . Dysuria   . Esophageal reflux   . Essential hypertension    unspecified  . Family history of adverse reaction to anesthesia    "liked to never get my sister awake" (03/18/2018)  . Head injury    unspecified  . Headache    "q time I have a stroke" (03/18/2018)  . Heart disease   . Hematuria, unspecified   . History of blood transfusion    "had 22 when they found out I had leukemia" (03/18/2018)  . Hypersomnia, unspecified   . Hypertension   . Hypopotassemia   . Hypothyroidism   . ICH (intracerebral hemorrhage) ([place])   . Ill-defined cerebrovascular disease    other  . Insomnia    unspecified  . 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 Leukemia ([place])   . Lower urinary tract infection   . Lumbago   .  Mini stroke ([place])    x years  . Mixed hyperlipidemia   . Myocardial infarction ([place]) ~2017  . Nontoxic nodular goiter    unspecified  . Occlusion and stenosis of unspecified carotid artery    without mention of cerebral infarction  . Osteoarthritis   . Osteoporosis   . Other constipation   . Other vitamin B12 deficiency anemias   . Otitis media, unspecified, unspecified ear   . Ovarian failure    unspecified  . Pain in limb   . Panic disorder without agoraphobia   . Peripheral vascular disease ([place])    unspecified  . Phlebitis of left arm   . RA (rheumatoid arthritis) ([place])   . Sigmoid polyp 1998  . Sleep disturbance    unspecified  . Stroke ([place]) 02/2017   "lots of mini strokes; big one 02/2017; that one made me weak in my  knees,; never fully recovered" (03/18/2018)  . Syncope and collapse   . Thalassemia   . TIA (transient ischemic attack)    2000 and 2008 right carotid stent 08/14/2009 on Plavix  . TIA (transient ischemic attack) 03/18/2018  . Transient disorder of initiating or maintaining sleep   . Type II diabetes mellitus ([place])   . Varicose veins of bilateral lower extremities with other complications   . Varicose veins of bilateral lower extremities with other complications   . Vision abnormalities     Past Surgical History:  Procedure Laterality Date  . BACK SURGERY    . CAROTID ENDARTERECTOMY Left   . CAROTID STENT INSERTION Right    "have 2 stents in there" (03/18/2018)  . CATARACT EXTRACTION W/ INTRAOCULAR LENS  IMPLANT, BILATERAL Bilateral 06/16/2001 - 05/28/2003   +23.5D     22.5D  . CHOLECYSTECTOMY OPEN  1980  . COLONOSCOPY  2010  . CORONARY ANGIOPLASTY WITH STENT PLACEMENT    . EYELID SURGERY Bilateral 2005   BUL BLEPH  . FRACTURE SURGERY    . JOINT REPLACEMENT    . KYPHOPLASTY N/A 08/12/2017   Procedure: KYPHOPLASTY;  Surgeon: Hessie Knows, MD;  Location: [facility] ORS;  Service: Orthopedics;  Laterality: N/A;  . KYPHOPLASTY N/A 11/08/2017   Procedure: Hewitt Shorts;  Surgeon: Hessie Knows, MD;  Location: [facility] ORS;  Service: Orthopedics;  Laterality: N/A;  . PERCUTANEOUS PLACEMENT INTRAVASCULAR STENT CERVICAL CAROTID ARTERY  06/14/2009  . ROTATOR CUFF REPAIR Left   . TOTAL ABDOMINAL HYSTERECTOMY  1978   WITH REMOVAL TUBES & /OR OVARIES  . TOTAL KNEE ARTHROPLASTY Right     Social History   Socioeconomic History  . Marital status: Married    Spouse name: Not on file  . Number of children: Not on file  . Years of education: Not on file  . Highest education level: Not on file  Occupational History  . Not on file  Social Needs  . Financial resource strain: Not on file  . Food insecurity:    Worry: Not on file    Inability: Not on file  . Transportation needs:    Medical: Not  on file    Non-medical: Not on file  Tobacco Use  . Smoking status: Never Smoker  . Smokeless tobacco: Never Used  Substance and Sexual Activity  . Alcohol use: No  . Drug use: No  . Sexual activity: Never  Lifestyle  . Physical activity:    Days per week: Not on file    Minutes per session: Not on file  . Stress: Not on file  Relationships  .  Social connections:    Talks on phone: Not on file    Gets together: Not on file    Attends religious service: Not on file    Active member of club or organization: Not on file    Attends meetings of clubs or organizations: Not on file    Relationship status: Not on file  . Intimate partner violence:    Fear of current or ex partner: Not on file    Emotionally abused: Not on file    Physically abused: Not on file    Forced sexual activity: Not on file  Other Topics Concern  . Not on file  Social History Narrative  . Not on file   Family History  Problem Relation Age of Onset  . Acute myelogenous leukemia Brother   . Anemia Brother   . Coronary artery disease Brother   . Heart disease Brother   . Stroke Brother   . Heart failure Brother   . Stroke Mother   . Anemia Mother   . Heart disease Mother   . Heart failure Mother   . Hypertension Mother   . Osteoarthritis Mother   . Rheum arthritis Mother   . Heart attack Father   . Anemia Sister   . Cataracts Sister   . Osteoarthritis Sister   . Rheum arthritis Sister   . Stroke Sister   . Heart disease Sister   . Thalassemia Sister   . Thalassemia Sister   . Blindness Neg Hx   . Glaucoma Neg Hx   . Macular degeneration Neg Hx   . Strabismus Neg Hx   . Vision loss Neg Hx   . Basal cell carcinoma Neg Hx   . GU problems Neg Hx   . Kidney cancer Neg Hx   . Melanoma Neg Hx   . Kidney disease Neg Hx   . Prostate cancer Neg Hx   . Squamous cell carcinoma Neg Hx     VITAL SIGNS BP (!) 121/49   Pulse 64   Temp 98.8 F (37.1 C)   Resp 17   Ht 5' (1.524 m)   Wt 104 lb  (47.2 kg)   SpO2 99%   BMI 20.31 kg/m   Patient's Medications  New Prescriptions   No medications on file  Previous Medications   ACETAMINOPHEN (TYLENOL) 500 MG TABLET    Take 1,000 mg by mouth every 8 (eight) hours.   ASCORBIC ACID (VITAMIN C) 1000 MG TABLET    Take 1,000 mg by mouth daily.   ASPIRIN 325 MG TABLET    Take 325 mg by mouth daily.    ATORVASTATIN (LIPITOR) 20 MG TABLET    Take 1 tablet (20 mg total) by mouth daily at 6 PM.   CARVEDILOL (COREG) 3.125 MG TABLET    Take 1 tablet (3.125 mg total) by mouth 2 (two) times daily with a meal.   CHOLECALCIFEROL (VITAMIN D) 1000 UNITS TABLET    Take 4,000 Units by mouth daily.    CYANOCOBALAMIN (,VITAMIN B-12,) 1000 MCG/ML INJECTION    Inject 1,000 mcg into the muscle every 30 (thirty) days. At the 1st of the month - GIVING EARLY AT PT REQUEST- PT MISSED THE INJECTION LAST MONTH   FOLIC ACID (FOLVITE) 527 MCG TABLET    Take 400 mcg by mouth daily.   LEVOTHYROXINE (SYNTHROID, LEVOTHROID) 50 MCG TABLET    Take 50 mcg by mouth daily.    MELATONIN 3 MG TABS    Take 1 tablet  by mouth at bedtime.   METHOCARBAMOL (ROBAXIN) 750 MG TABLET    Take 750 mg by mouth 3 (three) times daily.   NON FORMULARY    Diet type:  Regular   ONDANSETRON (ZOFRAN ODT) 4 MG DISINTEGRATING TABLET    Take 1 tablet (4 mg total) by mouth every 8 (eight) hours as needed for nausea or vomiting.   OXYCODONE (OXY-IR) 5 MG CAPSULE    Take 1/2 tablet by mouth  for moderate pain and 1 tablet by mouth  for severe pain every 6 hours as needed   POLYETHYLENE GLYCOL (MIRALAX / GLYCOLAX) PACKET    Take 17 g by mouth daily.    SENNOSIDES-DOCUSATE SODIUM (SENOKOT-S) 8.6-50 MG TABLET    Take 2 tablets by mouth 2 (two) times daily.   SOLIFENACIN (VESICARE) 5 MG TABLET    Take 5 mg by mouth daily.  Modified Medications   No medications on file  Discontinued Medications   VITAMIN E 400 UNIT CAPSULE    Take 400 Units by mouth daily.     SIGNIFICANT DIAGNOSTIC EXAMS   LABS  REVIEWED PREVIOUS:   03-19-18: tsh 1.121 hgb a1c 5.0; chol 127; ldl 71; trig 52; hdl 46 07-01-18: wbc  5.4; hgb 8.7 hct 29.0; mcv 63.7; mcv 63.7; plt  109; glucose 118; bun 25; creat 1.16; k+ 3.9; na++ 141; ca 9.1  NO NEW LABS.    Review of Systems  Constitutional: Negative for malaise/fatigue.  Respiratory: Negative for cough and shortness of breath.   Cardiovascular: Negative for chest pain, palpitations and leg swelling.  Gastrointestinal: Negative for abdominal pain, constipation and heartburn.  Musculoskeletal: Negative for back pain, joint pain and myalgias.  Skin: Negative.   Neurological: Negative for dizziness.  Psychiatric/Behavioral: The patient is not nervous/anxious.    Physical Exam  Constitutional: She is oriented to person, place, and time. She appears well-developed and well-nourished. No distress.  Frail   Neck: No thyromegaly present.  Cardiovascular: Normal rate, regular rhythm, normal heart sounds and intact distal pulses.  Pulmonary/Chest: Effort normal and breath sounds normal. No respiratory distress.  Abdominal: Soft. Bowel sounds are normal. She exhibits no distension. There is no tenderness.  Musculoskeletal: She exhibits no edema.  Is able to move all extremities Has large hematoma left buttock which is resolving; bruising is resolving.   Lymphadenopathy:    She has no cervical adenopathy.  Neurological: She is alert and oriented to person, place, and time.  Skin: Skin is warm and dry. She is not diaphoretic.  Psychiatric: She has a normal mood and affect.    ASSESSMENT/ PLAN:  Patient is being discharged with the following home health services:  Pt/ot/rn: to evaluate and treat as indicated for gait balance strength adl training and medication management  Patient is being discharged with the following durable medical equipment:  None required   Patient has been advised to f/u with their PCP in 1-2 weeks to bring them up to date on their rehab stay.   Social services at facility was responsible for arranging this appointment.  Pt was provided with a 30 day supply of prescriptions for medications and refills must be obtained from their PCP.  For controlled substances, a more limited supply may be provided adequate until PCP appointment only.   A 30 day supply of her prescription medications have been sent electronically to [place] with #10 oxycdone 5 mg tabs.    Time spent with patient: 35 minutes: discussed medications and refills; home health  needs; and dme ;verbalized understanding.   Ok Edwards NP [facility]  Contact [phone] Monday through Friday 8am- 5pm  After hours call [phone]

## 2018-07-18 ENCOUNTER — Other Ambulatory Visit: Payer: Self-pay | Admitting: Internal Medicine

## 2018-07-18 DIAGNOSIS — R1312 Dysphagia, oropharyngeal phase: Secondary | ICD-10-CM

## 2018-07-20 ENCOUNTER — Telehealth: Payer: Self-pay

## 2018-07-25 DIAGNOSIS — Z8673 Personal history of transient ischemic attack (TIA), and cerebral infarction without residual deficits: Secondary | ICD-10-CM | POA: Diagnosis not present

## 2018-07-25 DIAGNOSIS — N183 Chronic kidney disease, stage 3 (moderate): Secondary | ICD-10-CM | POA: Diagnosis not present

## 2018-07-25 DIAGNOSIS — E1122 Type 2 diabetes mellitus with diabetic chronic kidney disease: Secondary | ICD-10-CM | POA: Diagnosis not present

## 2018-07-25 DIAGNOSIS — M47812 Spondylosis without myelopathy or radiculopathy, cervical region: Secondary | ICD-10-CM | POA: Diagnosis not present

## 2018-07-25 DIAGNOSIS — I1310 Hypertensive heart and chronic kidney disease without heart failure, with stage 1 through stage 4 chronic kidney disease, or unspecified chronic kidney disease: Secondary | ICD-10-CM | POA: Diagnosis not present

## 2018-07-25 DIAGNOSIS — I252 Old myocardial infarction: Secondary | ICD-10-CM | POA: Diagnosis not present

## 2018-07-25 DIAGNOSIS — M8008XD Age-related osteoporosis with current pathological fracture, vertebra(e), subsequent encounter for fracture with routine healing: Secondary | ICD-10-CM | POA: Diagnosis not present

## 2018-07-25 DIAGNOSIS — I2510 Atherosclerotic heart disease of native coronary artery without angina pectoris: Secondary | ICD-10-CM | POA: Diagnosis not present

## 2018-07-25 DIAGNOSIS — M47816 Spondylosis without myelopathy or radiculopathy, lumbar region: Secondary | ICD-10-CM | POA: Diagnosis not present

## 2018-07-25 DIAGNOSIS — J45909 Unspecified asthma, uncomplicated: Secondary | ICD-10-CM | POA: Diagnosis not present

## 2018-07-25 DIAGNOSIS — G4700 Insomnia, unspecified: Secondary | ICD-10-CM | POA: Diagnosis not present

## 2018-07-25 DIAGNOSIS — E1151 Type 2 diabetes mellitus with diabetic peripheral angiopathy without gangrene: Secondary | ICD-10-CM | POA: Diagnosis not present

## 2018-07-25 DIAGNOSIS — E039 Hypothyroidism, unspecified: Secondary | ICD-10-CM | POA: Diagnosis not present

## 2018-07-25 DIAGNOSIS — E782 Mixed hyperlipidemia: Secondary | ICD-10-CM | POA: Diagnosis not present

## 2018-07-25 DIAGNOSIS — Z9181 History of falling: Secondary | ICD-10-CM | POA: Diagnosis not present

## 2018-07-25 DIAGNOSIS — M069 Rheumatoid arthritis, unspecified: Secondary | ICD-10-CM | POA: Diagnosis not present

## 2018-07-25 DIAGNOSIS — Z856 Personal history of leukemia: Secondary | ICD-10-CM | POA: Diagnosis not present

## 2018-07-25 DIAGNOSIS — F410 Panic disorder [episodic paroxysmal anxiety] without agoraphobia: Secondary | ICD-10-CM | POA: Diagnosis not present

## 2018-07-25 DIAGNOSIS — F329 Major depressive disorder, single episode, unspecified: Secondary | ICD-10-CM | POA: Diagnosis not present

## 2018-07-25 DIAGNOSIS — K219 Gastro-esophageal reflux disease without esophagitis: Secondary | ICD-10-CM | POA: Diagnosis not present

## 2018-07-26 DIAGNOSIS — E1122 Type 2 diabetes mellitus with diabetic chronic kidney disease: Secondary | ICD-10-CM | POA: Diagnosis not present

## 2018-07-26 DIAGNOSIS — N183 Chronic kidney disease, stage 3 (moderate): Secondary | ICD-10-CM | POA: Diagnosis not present

## 2018-07-26 DIAGNOSIS — I1310 Hypertensive heart and chronic kidney disease without heart failure, with stage 1 through stage 4 chronic kidney disease, or unspecified chronic kidney disease: Secondary | ICD-10-CM | POA: Diagnosis not present

## 2018-07-26 DIAGNOSIS — M47812 Spondylosis without myelopathy or radiculopathy, cervical region: Secondary | ICD-10-CM | POA: Diagnosis not present

## 2018-07-26 DIAGNOSIS — M8008XD Age-related osteoporosis with current pathological fracture, vertebra(e), subsequent encounter for fracture with routine healing: Secondary | ICD-10-CM | POA: Diagnosis not present

## 2018-07-26 DIAGNOSIS — E1151 Type 2 diabetes mellitus with diabetic peripheral angiopathy without gangrene: Secondary | ICD-10-CM | POA: Diagnosis not present

## 2018-07-27 ENCOUNTER — Telehealth: Payer: Self-pay

## 2018-07-27 NOTE — Telephone Encounter (Signed)
Gave verbal order to  Madel[facility] from [facility] [phone] for home health Physical therapy 2 times a week for 4 weeks

## 2018-07-28 DIAGNOSIS — S3210XD Unspecified fracture of sacrum, subsequent encounter for fracture with routine healing: Secondary | ICD-10-CM | POA: Diagnosis not present

## 2018-07-28 DIAGNOSIS — N183 Chronic kidney disease, stage 3 (moderate): Secondary | ICD-10-CM | POA: Diagnosis not present

## 2018-07-28 DIAGNOSIS — D631 Anemia in chronic kidney disease: Secondary | ICD-10-CM | POA: Diagnosis not present

## 2018-07-28 DIAGNOSIS — M545 Low back pain: Secondary | ICD-10-CM | POA: Diagnosis not present

## 2018-08-02 DIAGNOSIS — I1310 Hypertensive heart and chronic kidney disease without heart failure, with stage 1 through stage 4 chronic kidney disease, or unspecified chronic kidney disease: Secondary | ICD-10-CM | POA: Diagnosis not present

## 2018-08-02 DIAGNOSIS — E1151 Type 2 diabetes mellitus with diabetic peripheral angiopathy without gangrene: Secondary | ICD-10-CM | POA: Diagnosis not present

## 2018-08-02 DIAGNOSIS — M8008XD Age-related osteoporosis with current pathological fracture, vertebra(e), subsequent encounter for fracture with routine healing: Secondary | ICD-10-CM | POA: Diagnosis not present

## 2018-08-02 DIAGNOSIS — N183 Chronic kidney disease, stage 3 (moderate): Secondary | ICD-10-CM | POA: Diagnosis not present

## 2018-08-02 DIAGNOSIS — E1122 Type 2 diabetes mellitus with diabetic chronic kidney disease: Secondary | ICD-10-CM | POA: Diagnosis not present

## 2018-08-02 DIAGNOSIS — M47812 Spondylosis without myelopathy or radiculopathy, cervical region: Secondary | ICD-10-CM | POA: Diagnosis not present

## 2018-08-03 NOTE — Telephone Encounter (Signed)
done

## 2018-08-05 ENCOUNTER — Ambulatory Visit: Payer: Medicare Other | Attending: Internal Medicine

## 2018-08-05 DIAGNOSIS — E1122 Type 2 diabetes mellitus with diabetic chronic kidney disease: Secondary | ICD-10-CM | POA: Diagnosis not present

## 2018-08-05 DIAGNOSIS — I1310 Hypertensive heart and chronic kidney disease without heart failure, with stage 1 through stage 4 chronic kidney disease, or unspecified chronic kidney disease: Secondary | ICD-10-CM | POA: Diagnosis not present

## 2018-08-05 DIAGNOSIS — M8008XD Age-related osteoporosis with current pathological fracture, vertebra(e), subsequent encounter for fracture with routine healing: Secondary | ICD-10-CM | POA: Diagnosis not present

## 2018-08-05 DIAGNOSIS — M47812 Spondylosis without myelopathy or radiculopathy, cervical region: Secondary | ICD-10-CM | POA: Diagnosis not present

## 2018-08-05 DIAGNOSIS — E1151 Type 2 diabetes mellitus with diabetic peripheral angiopathy without gangrene: Secondary | ICD-10-CM | POA: Diagnosis not present

## 2018-08-05 DIAGNOSIS — N183 Chronic kidney disease, stage 3 (moderate): Secondary | ICD-10-CM | POA: Diagnosis not present

## 2018-08-09 DIAGNOSIS — E1151 Type 2 diabetes mellitus with diabetic peripheral angiopathy without gangrene: Secondary | ICD-10-CM | POA: Diagnosis not present

## 2018-08-09 DIAGNOSIS — M47812 Spondylosis without myelopathy or radiculopathy, cervical region: Secondary | ICD-10-CM | POA: Diagnosis not present

## 2018-08-09 DIAGNOSIS — E1122 Type 2 diabetes mellitus with diabetic chronic kidney disease: Secondary | ICD-10-CM | POA: Diagnosis not present

## 2018-08-09 DIAGNOSIS — N183 Chronic kidney disease, stage 3 (moderate): Secondary | ICD-10-CM | POA: Diagnosis not present

## 2018-08-09 DIAGNOSIS — M8008XD Age-related osteoporosis with current pathological fracture, vertebra(e), subsequent encounter for fracture with routine healing: Secondary | ICD-10-CM | POA: Diagnosis not present

## 2018-08-09 DIAGNOSIS — I1310 Hypertensive heart and chronic kidney disease without heart failure, with stage 1 through stage 4 chronic kidney disease, or unspecified chronic kidney disease: Secondary | ICD-10-CM | POA: Diagnosis not present

## 2018-08-12 DIAGNOSIS — N183 Chronic kidney disease, stage 3 (moderate): Secondary | ICD-10-CM | POA: Diagnosis not present

## 2018-08-12 DIAGNOSIS — M47812 Spondylosis without myelopathy or radiculopathy, cervical region: Secondary | ICD-10-CM | POA: Diagnosis not present

## 2018-08-12 DIAGNOSIS — E1122 Type 2 diabetes mellitus with diabetic chronic kidney disease: Secondary | ICD-10-CM | POA: Diagnosis not present

## 2018-08-12 DIAGNOSIS — I1310 Hypertensive heart and chronic kidney disease without heart failure, with stage 1 through stage 4 chronic kidney disease, or unspecified chronic kidney disease: Secondary | ICD-10-CM | POA: Diagnosis not present

## 2018-08-12 DIAGNOSIS — E1151 Type 2 diabetes mellitus with diabetic peripheral angiopathy without gangrene: Secondary | ICD-10-CM | POA: Diagnosis not present

## 2018-08-12 DIAGNOSIS — M8008XD Age-related osteoporosis with current pathological fracture, vertebra(e), subsequent encounter for fracture with routine healing: Secondary | ICD-10-CM | POA: Diagnosis not present

## 2018-08-19 ENCOUNTER — Telehealth: Payer: Self-pay

## 2018-08-19 DIAGNOSIS — D631 Anemia in chronic kidney disease: Secondary | ICD-10-CM | POA: Diagnosis not present

## 2018-08-19 DIAGNOSIS — N183 Chronic kidney disease, stage 3 (moderate): Secondary | ICD-10-CM | POA: Diagnosis not present

## 2018-08-19 DIAGNOSIS — D72820 Lymphocytosis (symptomatic): Secondary | ICD-10-CM | POA: Diagnosis not present

## 2018-08-19 NOTE — Telephone Encounter (Signed)
Katie from [place] called to get a verbal order for pt physical therapy for 2 times a week for 4 weeks. Physical Therapist lmom and called back and lmom and gave verbal ok on confidential vm.

## 2018-08-23 DIAGNOSIS — E1122 Type 2 diabetes mellitus with diabetic chronic kidney disease: Secondary | ICD-10-CM | POA: Diagnosis not present

## 2018-08-23 DIAGNOSIS — N183 Chronic kidney disease, stage 3 (moderate): Secondary | ICD-10-CM | POA: Diagnosis not present

## 2018-08-23 DIAGNOSIS — I1310 Hypertensive heart and chronic kidney disease without heart failure, with stage 1 through stage 4 chronic kidney disease, or unspecified chronic kidney disease: Secondary | ICD-10-CM | POA: Diagnosis not present

## 2018-08-23 DIAGNOSIS — E1151 Type 2 diabetes mellitus with diabetic peripheral angiopathy without gangrene: Secondary | ICD-10-CM | POA: Diagnosis not present

## 2018-08-23 DIAGNOSIS — M8008XD Age-related osteoporosis with current pathological fracture, vertebra(e), subsequent encounter for fracture with routine healing: Secondary | ICD-10-CM | POA: Diagnosis not present

## 2018-08-23 DIAGNOSIS — M47812 Spondylosis without myelopathy or radiculopathy, cervical region: Secondary | ICD-10-CM | POA: Diagnosis not present

## 2018-08-26 DIAGNOSIS — D631 Anemia in chronic kidney disease: Secondary | ICD-10-CM | POA: Diagnosis not present

## 2018-08-26 DIAGNOSIS — N183 Chronic kidney disease, stage 3 (moderate): Secondary | ICD-10-CM | POA: Diagnosis not present

## 2018-08-30 ENCOUNTER — Encounter: Payer: Self-pay | Admitting: Nurse Practitioner

## 2018-08-30 ENCOUNTER — Ambulatory Visit (INDEPENDENT_AMBULATORY_CARE_PROVIDER_SITE_OTHER): Payer: Medicare Other | Admitting: Nurse Practitioner

## 2018-08-30 VITALS — BP 152/74 | HR 85 | Resp 16 | Ht 60.0 in | Wt 101.6 lb

## 2018-08-30 DIAGNOSIS — E039 Hypothyroidism, unspecified: Secondary | ICD-10-CM | POA: Diagnosis not present

## 2018-08-30 DIAGNOSIS — E162 Hypoglycemia, unspecified: Secondary | ICD-10-CM

## 2018-08-30 DIAGNOSIS — I10 Essential (primary) hypertension: Secondary | ICD-10-CM | POA: Diagnosis not present

## 2018-08-30 DIAGNOSIS — E538 Deficiency of other specified B group vitamins: Secondary | ICD-10-CM

## 2018-08-30 DIAGNOSIS — R110 Nausea: Secondary | ICD-10-CM | POA: Diagnosis not present

## 2018-08-30 DIAGNOSIS — K581 Irritable bowel syndrome with constipation: Secondary | ICD-10-CM

## 2018-08-30 DIAGNOSIS — I639 Cerebral infarction, unspecified: Secondary | ICD-10-CM | POA: Insufficient documentation

## 2018-08-30 DIAGNOSIS — G459 Transient cerebral ischemic attack, unspecified: Secondary | ICD-10-CM | POA: Insufficient documentation

## 2018-08-30 LAB — POCT GLYCOSYLATED HEMOGLOBIN (HGB A1C): Hemoglobin A1C: 5 % (ref 4.0–5.6)

## 2018-08-30 MED ORDER — LINACLOTIDE 145 MCG PO CAPS: 145.0000 ug | ORAL_CAPSULE | Freq: Every day | ORAL | 3 refills | Status: DC

## 2018-08-30 MED ORDER — ONDANSETRON 4 MG PO TBDP: 4.0000 mg | ORAL_TABLET | Freq: Three times a day (TID) | ORAL | 0 refills | Status: DC | PRN

## 2018-08-30 MED ORDER — CYANOCOBALAMIN 1000 MCG/ML IJ SOLN
1000.0000 ug | Freq: Once | INTRAMUSCULAR | Status: AC
  Administered 2018-08-30: 1000 ug via INTRAMUSCULAR

## 2018-08-30 MED ORDER — LEVOTHYROXINE SODIUM 50 MCG PO TABS: 50.0000 ug | ORAL_TABLET | Freq: Every day | ORAL | 0 refills | Status: DC

## 2018-08-30 NOTE — Progress Notes (Signed)
[facility] [place], [place] [address]  Internal MEDICINE  Office Visit Note  Patient Name: Stephanie Harris  [id number]  [id number]  Date of Service: 08/31/2018  Chief Complaint  Patient presents with  . Medical Management of Chronic Issues    medication refills  . Cough    The patient is here for routine follow up visit. States that she feels herself getting weaker and weaker.  Does have physical therapy coming to her  Home twice weekly to help her build her strength and improve generalized joint pain. States that she gets very shaky, especially first thing in the morning. Will get nauseated during the night and have to vomit. Was taking zofran which did help, but she has not had this for some time. She also states that she is very constipated, which might be contributing to the nausea. Has not had bowel movement in over a week. When this happened in the past, she took linzess and she states that this helped a lot but has not had this prescription for some time . Blood pressure is generally well controlled. She states she would like to start getting her b12 injections here. She has been getting these injections on the first of the month, but is having a hard time affording these injections.       Current Medication: Outpatient Encounter Medications as of 08/30/2018  Medication Sig  . carvedilol (COREG) 3.125 MG tablet Take 1 tablet (3.125 mg total) by mouth 2 (two) times daily with a meal.  . levothyroxine (SYNTHROID, LEVOTHROID) 50 MCG tablet Take 1 tablet (50 mcg total) by mouth daily.  . [DISCONTINUED] levothyroxine (SYNTHROID, LEVOTHROID) 50 MCG tablet Take 1 tablet (50 mcg total) by mouth daily.  Marland Kitchen acetaminophen (TYLENOL) 500 MG tablet Take 1,000 mg by mouth every 8 (eight) hours.  Marland Kitchen ascorbic acid (VITAMIN C) 1000 MG tablet Take 1,000 mg by mouth daily.  Marland Kitchen aspirin 325 MG tablet Take 325 mg by mouth daily.   Marland Kitchen atorvastatin (LIPITOR) 20 MG tablet Take 1  tablet (20 mg total) by mouth daily at 6 PM. (Patient not taking: Reported on 08/30/2018)  . cholecalciferol (VITAMIN D) 1000 UNITS tablet Take 4,000 Units by mouth daily.   . cyanocobalamin (,VITAMIN B-12,) 1000 MCG/ML injection Inject 1,000 mcg into the muscle every 30 (thirty) days. At the 1st of the month - GIVING EARLY AT PT REQUEST- PT MISSED THE INJECTION LAST MONTH  . folic acid (FOLVITE) 016 MCG tablet Take 400 mcg by mouth daily.  Marland Kitchen linaclotide (LINZESS) 145 MCG CAPS capsule Take 1 capsule (145 mcg total) by mouth daily before breakfast.  . Melatonin 3 MG TABS Take 1 tablet by mouth at bedtime.  . NON FORMULARY Diet type:  Regular  . ondansetron (ZOFRAN ODT) 4 MG disintegrating tablet Take 1 tablet (4 mg total) by mouth every 8 (eight) hours as needed for nausea or vomiting.  . polyethylene glycol (MIRALAX / GLYCOLAX) packet Take 17 g by mouth daily.   . sennosides-docusate sodium (SENOKOT-S) 8.6-50 MG tablet Take 2 tablets by mouth 2 (two) times daily.  . [DISCONTINUED] methocarbamol (ROBAXIN) 750 MG tablet Take 1 tablet (750 mg total) by mouth 3 (three) times daily. (Patient not taking: Reported on 08/30/2018)  . [DISCONTINUED] ondansetron (ZOFRAN ODT) 4 MG disintegrating tablet Take 1 tablet (4 mg total) by mouth every 8 (eight) hours as needed for nausea or vomiting. (Patient not taking: Reported on 08/30/2018)  . [DISCONTINUED] OxyCODONE HCl, Abuse Deter, (  OXAYDO) 5 MG TABA Take 2.5-5 mg by mouth every 6 (six) hours as needed (take 1/2 tabs for moderate pain and 1 tab for severe pain). (Patient not taking: Reported on 08/30/2018)  . [DISCONTINUED] solifenacin (VESICARE) 5 MG tablet Take 1 tablet (5 mg total) by mouth daily. (Patient not taking: Reported on 08/30/2018)  . [EXPIRED] cyanocobalamin ((VITAMIN B-12)) injection 1,000 mcg    No facility-administered encounter medications on file as of 08/30/2018.     Surgical History: Past Surgical History:  Procedure Laterality Date  . BACK  SURGERY    . CAROTID ENDARTERECTOMY Left   . CAROTID STENT INSERTION Right    "have 2 stents in there" (03/18/2018)  . CATARACT EXTRACTION W/ INTRAOCULAR LENS  IMPLANT, BILATERAL Bilateral 06/16/2001 - 05/28/2003   +23.5D     22.5D  . CHOLECYSTECTOMY OPEN  1980  . COLONOSCOPY  2010  . CORONARY ANGIOPLASTY WITH STENT PLACEMENT    . EYELID SURGERY Bilateral 2005   BUL BLEPH  . FRACTURE SURGERY    . JOINT REPLACEMENT    . KYPHOPLASTY N/A 08/12/2017   Procedure: KYPHOPLASTY;  Surgeon: Hessie Knows, MD;  Location: [facility] ORS;  Service: Orthopedics;  Laterality: N/A;  . KYPHOPLASTY N/A 11/08/2017   Procedure: Hewitt Shorts;  Surgeon: Hessie Knows, MD;  Location: [facility] ORS;  Service: Orthopedics;  Laterality: N/A;  . PERCUTANEOUS PLACEMENT INTRAVASCULAR STENT CERVICAL CAROTID ARTERY  06/14/2009  . ROTATOR CUFF REPAIR Left   . TOTAL ABDOMINAL HYSTERECTOMY  1978   WITH REMOVAL TUBES & /OR OVARIES  . TOTAL KNEE ARTHROPLASTY Right     Medical History: Past Medical History:  Diagnosis Date  . Acute bronchiolitis   . Acute pharyngitis   . Allergic rhinitis due to pollen   . Anemia, unspecified    CKD and LGL  . Anxiety state    unspecified  . Arthritis    "knees, legs" (03/18/2018)  . Asthma without status asthmaticus    unspecified  . B12 deficiency   . Beta thalassemia trait   . Carotid artery occlusion   . Cellulitis and abscess of leg, except foot   . Cervical spondylosis   . Cervicalgia   . Chronic kidney disease   . Chronic lower back pain   . CML (chronic myelocytic leukemia) ([place])    Onc at [facility]  . Complication of anesthesia    "last back surgery they liked to never get me awake" (03/18/2018)  . Coronary artery disease 2009   heart attack with stent  . Coronary atherosclerosis of native coronary artery   . Depressive disorder    not elsewhere classified  . Difficulty in walking   . Disorder of breast, unspecified   . Dizziness and giddiness   . Dysuria   . Esophageal  reflux   . Essential hypertension    unspecified  . Family history of adverse reaction to anesthesia    "liked to never get my sister awake" (03/18/2018)  . Head injury    unspecified  . Headache    "q time I have a stroke" (03/18/2018)  . Heart disease   . Hematuria, unspecified   . History of blood transfusion    "had 22 when they found out I had leukemia" (03/18/2018)  . Hypersomnia, unspecified   . Hypertension   . Hypopotassemia   . Hypothyroidism   . ICH (intracerebral hemorrhage) ([place])   . Ill-defined cerebrovascular disease    other  . Insomnia    unspecified  . 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 Leukemia ([place])   . Lower urinary tract infection   . Lumbago   . Mini stroke ([place])    x years  . Mixed hyperlipidemia   . Myocardial infarction ([place]) ~2017  . Nontoxic nodular goiter    unspecified  . Occlusion and stenosis of unspecified carotid artery    without mention of cerebral infarction  . Osteoarthritis   . Osteoporosis   . Other constipation   . Other vitamin B12 deficiency anemias   . Otitis media, unspecified, unspecified ear   . Ovarian failure    unspecified  . Pain in limb   . Panic disorder without agoraphobia   . Peripheral vascular disease ([place])    unspecified  . Phlebitis of left arm   . RA (rheumatoid arthritis) ([place])   . Sigmoid polyp 1998  . Sleep disturbance    unspecified  . Stroke ([place]) 02/2017   "lots of mini strokes; big one 02/2017; that one made me weak in my knees,; never fully recovered" (03/18/2018)  . Syncope and collapse   . Thalassemia   . TIA (transient ischemic attack)    2000 and 2008 right carotid stent 08/14/2009 on Plavix  . TIA (transient ischemic attack) 03/18/2018  . Transient disorder of initiating or maintaining sleep   . Type II diabetes mellitus ([place])   . Varicose veins of bilateral lower extremities with other complications   . Varicose veins of bilateral lower extremities with other complications    . Vision abnormalities     Family History: Family History  Problem Relation Age of Onset  . Acute myelogenous leukemia Brother   . Anemia Brother   . Coronary artery disease Brother   . Heart disease Brother   . Stroke Brother   . Heart failure Brother   . Stroke Mother   . Anemia Mother   . Heart disease Mother   . Heart failure Mother   . Hypertension Mother   . Osteoarthritis Mother   . Rheum arthritis Mother   . Heart attack Father   . Anemia Sister   . Cataracts Sister   . Osteoarthritis Sister   . Rheum arthritis Sister   . Stroke Sister   . Heart disease Sister   . Thalassemia Sister   . Thalassemia Sister   . Blindness Neg Hx   . Glaucoma Neg Hx   . Macular degeneration Neg Hx   . Strabismus Neg Hx   . Vision loss Neg Hx   . Basal cell carcinoma Neg Hx   . GU problems Neg Hx   . Kidney cancer Neg Hx   . Melanoma Neg Hx   . Kidney disease Neg Hx   . Prostate cancer Neg Hx   . Squamous cell carcinoma Neg Hx     Social History   Socioeconomic History  . Marital status: Married    Spouse name: Not on file  . Number of children: Not on file  . Years of education: Not on file  . Highest education level: Not on file  Occupational History  . Not on file  Social Needs  . Financial resource strain: Not on file  . Food insecurity:    Worry: Not on file    Inability: Not on file  . Transportation needs:    Medical: Not on file    Non-medical: Not on file  Tobacco Use  . Smoking status: Never Smoker  . Smokeless tobacco: Never Used  Substance and Sexual Activity  .  Alcohol use: No  . Drug use: No  . Sexual activity: Never  Lifestyle  . Physical activity:    Days per week: Not on file    Minutes per session: Not on file  . Stress: Not on file  Relationships  . Social connections:    Talks on phone: Not on file    Gets together: Not on file    Attends religious service: Not on file    Active member of club or organization: Not on file     Attends meetings of clubs or organizations: Not on file    Relationship status: Not on file  . Intimate partner violence:    Fear of current or ex partner: Not on file    Emotionally abused: Not on file    Physically abused: Not on file    Forced sexual activity: Not on file  Other Topics Concern  . Not on file  Social History Narrative  . Not on file      Review of Systems  Constitutional: Positive for activity change and fatigue. Negative for appetite change and unexpected weight change.  HENT: Negative for congestion, hearing loss, sinus pain and tinnitus.   Respiratory: Negative for shortness of breath and wheezing.   Cardiovascular: Negative for chest pain, palpitations and leg swelling.       Improved blood pressure   Gastrointestinal: Negative for constipation, diarrhea, nausea and vomiting.  Endocrine: Negative for cold intolerance, heat intolerance, polydipsia and polyuria.  Genitourinary: Negative.   Musculoskeletal: Positive for arthralgias, back pain and gait problem.       Treated for closed fracture of sacrum in 06/2018.   Skin: Negative for rash.  Allergic/Immunologic: Negative for environmental allergies.  Neurological: Positive for weakness and headaches. Negative for dizziness.  Hematological: Negative for adenopathy.  Psychiatric/Behavioral: Positive for dysphoric mood. The patient is nervous/anxious.     Today's Vitals   08/30/18 1509  BP: (!) 152/74  Pulse: 85  Resp: 16  SpO2: 99%  Weight: 101 lb 9.6 oz (46.1 kg)  Height: 5' (1.524 m)   Body mass index is 19.84 kg/m.   Physical Exam Vitals signs and nursing note reviewed.  Constitutional:      Comments: Frail appearing.  HENT:     Head: Normocephalic and atraumatic.     Nose: Nose normal.  Eyes:     Conjunctiva/sclera: Conjunctivae normal.     Pupils: Pupils are equal, round, and reactive to light.  Neck:     Musculoskeletal: Neck supple. Decreased range of motion. Spinous process  tenderness and muscular tenderness present.  Cardiovascular:     Rate and Rhythm: Normal rate and regular rhythm.     Heart sounds: Normal heart sounds.  Pulmonary:     Effort: Pulmonary effort is normal.     Breath sounds: Normal breath sounds. No wheezing.  Musculoskeletal:        General: No tenderness or deformity.     Comments: Patient using a walker to help with mobility. She has moderate bruising covering most of the dorsal surface of her right foot. No selling. Mild tenderness.   Skin:    General: Skin is warm and dry.     Capillary Refill: Capillary refill takes 2 to 3 seconds.  Neurological:     Mental Status: She is alert and oriented to person, place, and time. Mental status is at baseline.     Cranial Nerves: No cranial nerve deficit.  Psychiatric:  Mood and Affect: Mood is anxious.        Speech: Speech normal.        Behavior: Behavior normal.        Thought Content: Thought content normal.        Judgment: Judgment normal.   Assessment/Plan: 1. Hypoglycemia - POCT HgB A1C 5.0. encouraged her to eat small frequent meals with increase protein intake to keep blood sugars stable. Continue to monitor closely   2. Essential hypertension, benign Stable. Continue bp medication as prescribed.   3. Nausea May take zofran 4mg  up to three times daily as needed for nausea - ondansetron (ZOFRAN ODT) 4 MG disintegrating tablet; Take 1 tablet (4 mg total) by mouth every 8 (eight) hours as needed for nausea or vomiting.  Dispense: 10 tablet; Refill: 0  4. Acquired hypothyroidism Continue levothyroxine 56mcg daily.  - levothyroxine (SYNTHROID, LEVOTHROID) 50 MCG tablet; Take 1 tablet (50 mcg total) by mouth daily.  Dispense: 30 tablet; Refill: 0  5. Irritable bowel syndrome with constipation Restart linzess 167mcg daily. Samples provided and new prescription sent to pharmacy. Advised her to increase wwater and fiber intake to help reduce constipation.  - linaclotide  (LINZESS) 145 MCG CAPS capsule; Take 1 capsule (145 mcg total) by mouth daily before breakfast.  Dispense: 30 capsule; Refill: 3  6. B12 deficiency b12 injection today. Will have her scheduled for monthly b12 injections.  - cyanocobalamin ((VITAMIN B-12)) injection 1,000 mcg  General Counseling: Ashia verbalizes understanding of the findings of todays visit and agrees with plan of treatment. I have discussed any further diagnostic evaluation that may be needed or ordered today. We also reviewed her medications today. she has been encouraged to call the office with any questions or concerns that should arise related to todays visit.  A high fiber diet with plenty of fluids (up to 8 glasses of water daily) is suggested to relieve these symptoms.  Metamucil, 1 tablespoon once or twice daily can be used to keep bowels regular if needed.  This patient was seen by Leretha Pol FNP Collaboration with Dr [name] as a part of collaborative care agreement  Orders Placed This Encounter  Procedures  . POCT HgB A1C    Meds ordered this encounter  Medications  . levothyroxine (SYNTHROID, LEVOTHROID) 50 MCG tablet    Sig: Take 1 tablet (50 mcg total) by mouth daily.    Dispense:  30 tablet    Refill:  0    Order Specific Question:   Supervising Provider    Answer:   [name] [[id number]]  . ondansetron (ZOFRAN ODT) 4 MG disintegrating tablet    Sig: Take 1 tablet (4 mg total) by mouth every 8 (eight) hours as needed for nausea or vomiting.    Dispense:  10 tablet    Refill:  0    Order Specific Question:   Supervising Provider    Answer:   [name] [[id number]]  . linaclotide (LINZESS) 145 MCG CAPS capsule    Sig: Take 1 capsule (145 mcg total) by mouth daily before breakfast.    Dispense:  30 capsule    Refill:  3    Order Specific Question:   Supervising Provider    Answer:   [name] [[id number]]  . cyanocobalamin ((VITAMIN B-12)) injection 1,000 mcg    Time spent: 78  Minutes      Dr [name] Internal medicine

## 2018-08-31 DIAGNOSIS — E538 Deficiency of other specified B group vitamins: Secondary | ICD-10-CM | POA: Insufficient documentation

## 2018-08-31 DIAGNOSIS — E162 Hypoglycemia, unspecified: Secondary | ICD-10-CM | POA: Insufficient documentation

## 2018-08-31 DIAGNOSIS — R110 Nausea: Secondary | ICD-10-CM | POA: Insufficient documentation

## 2018-09-01 ENCOUNTER — Telehealth: Payer: Self-pay

## 2018-09-01 ENCOUNTER — Other Ambulatory Visit: Payer: Self-pay

## 2018-09-01 NOTE — Telephone Encounter (Signed)
Faxed Bp machine

## 2018-09-01 NOTE — Telephone Encounter (Signed)
TRACY FROM [facility] CALLED SAYING THAT MS. Triplett NEEDS A SCRIPT FOR BP MACHINE SO THAT THEY CAN ORDER ONE FOR HER.   A SCRIPT WAS GIVEN BY HEATHER AND I FAXED IT OVER TO [facility] AT [phone].

## 2018-09-08 DIAGNOSIS — D72820 Lymphocytosis (symptomatic): Secondary | ICD-10-CM | POA: Diagnosis not present

## 2018-09-16 ENCOUNTER — Other Ambulatory Visit: Payer: Self-pay | Admitting: Nurse Practitioner

## 2018-09-16 DIAGNOSIS — R110 Nausea: Secondary | ICD-10-CM

## 2018-09-16 MED ORDER — ONDANSETRON 4 MG PO TBDP: 4.0000 mg | ORAL_TABLET | Freq: Three times a day (TID) | ORAL | 0 refills | Status: DC | PRN

## 2018-09-18 IMAGING — MR MR LUMBAR SPINE W/O CM
10 of 11 series · 40 of 48 positions shown · non-contrast
Comparison: 04/06/2013 MRI of the thoracic spine. 07/31/2017 CT of
abdomen and pelvis.

CLINICAL DATA: 86 y/o F; T12 and L1 compression deformities.
History of CML. Chest and back pain.

EXAM:
MRI THORACIC AND LUMBAR SPINE WITHOUT CONTRAST
TECHNIQUE: Multiplanar and multiecho pulse sequences of the thoracic and lumbar
spine were obtained without intravenous contrast.

[Series 3: sag loc for · sagittal · 5.0mm · 0.68mm/px · 1 of 7 slices shown]
[im 1/7]
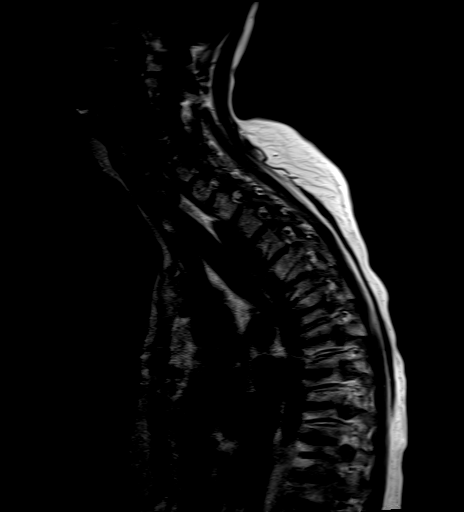

[Series 4: T2 · sagittal · 4.0mm · 1.33mm/px · 2 of 15 slices shown (1 of 4)]
[im 1/15]
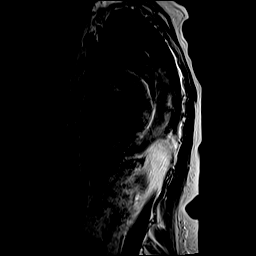
[im 15/15]
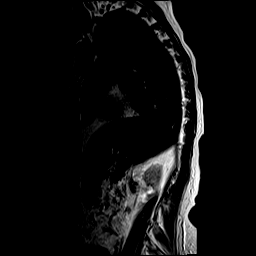

[Series 5: T1 · sagittal · 4.0mm · 1.33mm/px · 3 of 15 slices shown (1 of 3)]
[im 1/15]
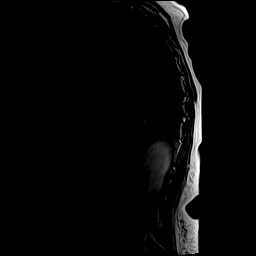
[im 8/15]
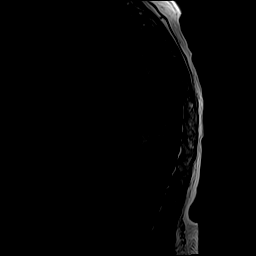
[im 15/15]
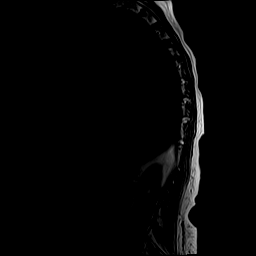

[Series 6: STIR · sagittal · 4.0mm · 1.33mm/px · 3 of 15 slices shown (1 of 2)]
[im 1/15]
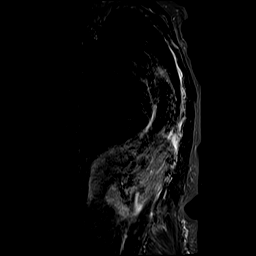
[im 8/15]
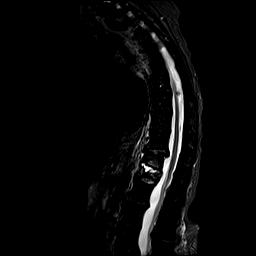
[im 15/15]
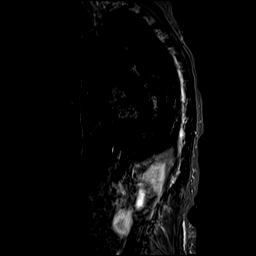

[Series 7: T2 · axial · 5.0mm · 0.86mm/px · z∈[-148,+6]mm · 8 of 41 slices shown (2 of 4)]
[im 1/41]
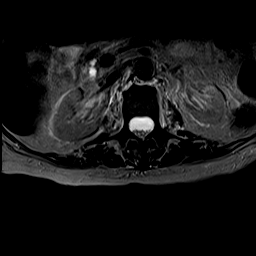
[im 6/41]
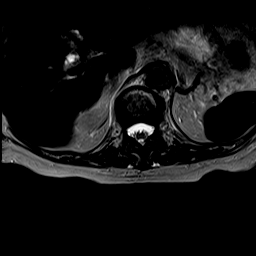
[im 12/41]
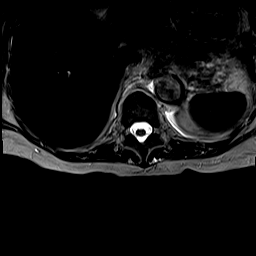
[im 18/41]
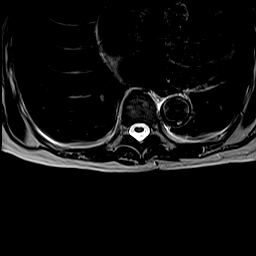
[im 23/41]
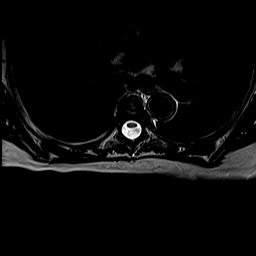
[im 29/41]
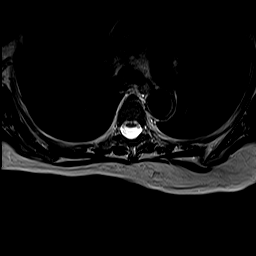
[im 35/41]
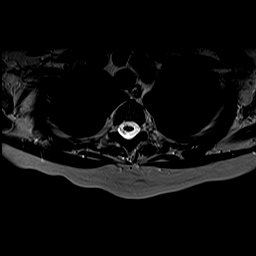
[im 41/41]
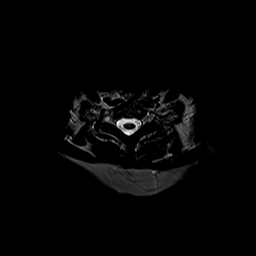

[Series 10: T2 · sagittal · 4.0mm · 1.02mm/px · 3 of 17 slices shown (3 of 4)]
[im 1/17]
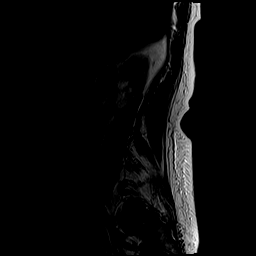
[im 9/17]
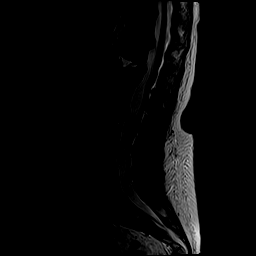
[im 17/17]
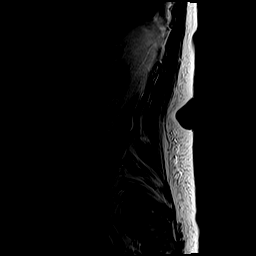

[Series 11: T1 · sagittal · 4.0mm · 1.02mm/px · 3 of 17 slices shown (2 of 3)]
[im 1/17]
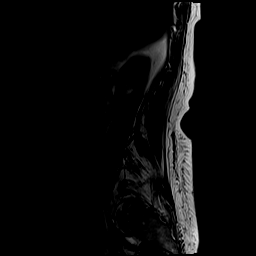
[im 9/17]
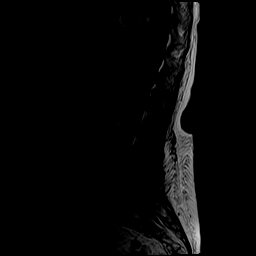
[im 17/17]
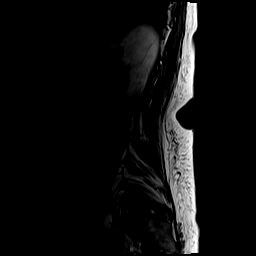

[Series 12: STIR · sagittal · 4.0mm · 1.02mm/px · 3 of 17 slices shown (2 of 2)]
[im 1/17]
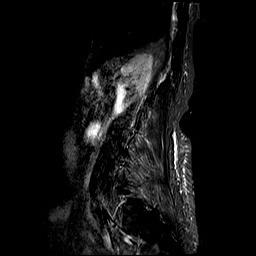
[im 9/17]
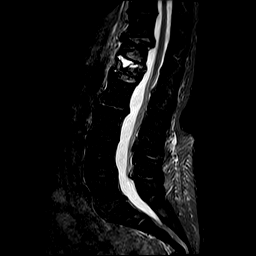
[im 17/17]
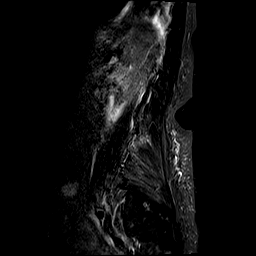

[Series 13: T2 · axial · 4.0mm · 0.78mm/px · z∈[-317,-105]mm · 7 of 38 slices shown (4 of 4)]
[im 1/38]
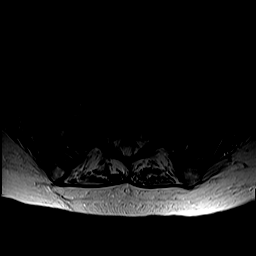
[im 7/38]
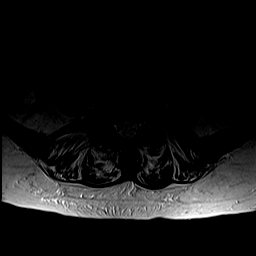
[im 13/38]
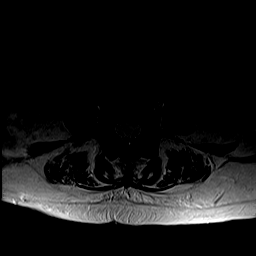
[im 19/38]
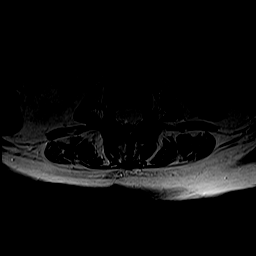
[im 25/38]
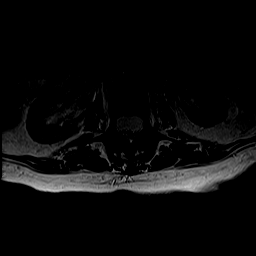
[im 31/38]
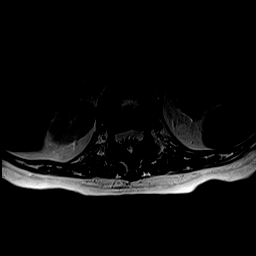
[im 38/38]
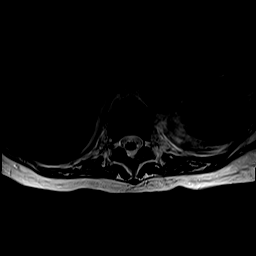

[Series 14: T1 · axial · 4.0mm · 0.39mm/px · z∈[-317,-105]mm · 7 of 38 slices shown (3 of 3)]
[im 1/38]
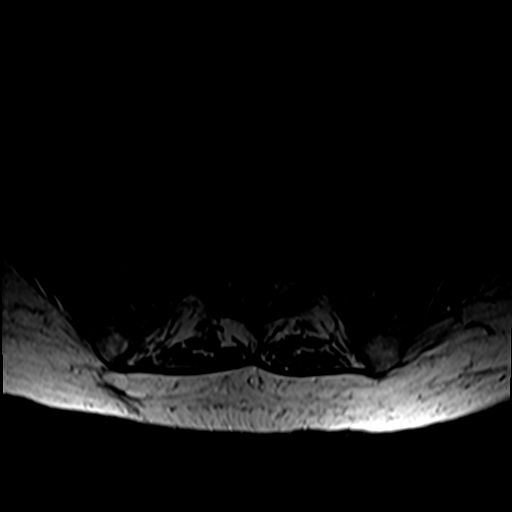
[im 7/38]
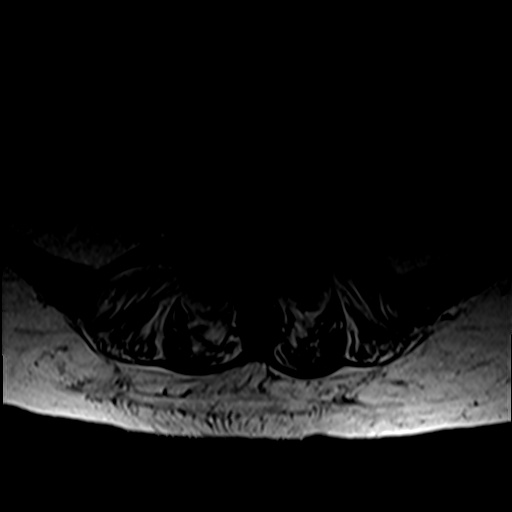
[im 13/38]
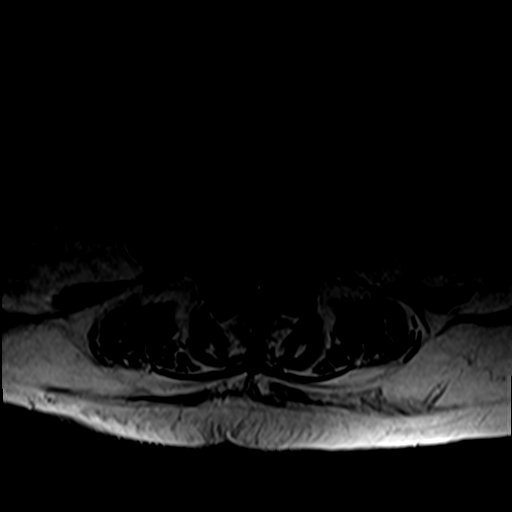
[im 19/38]
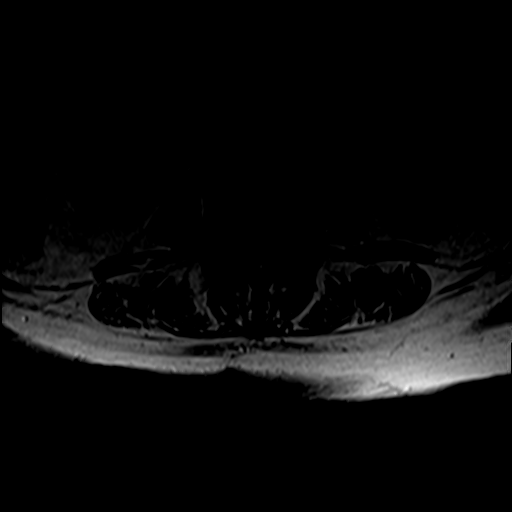
[im 25/38]
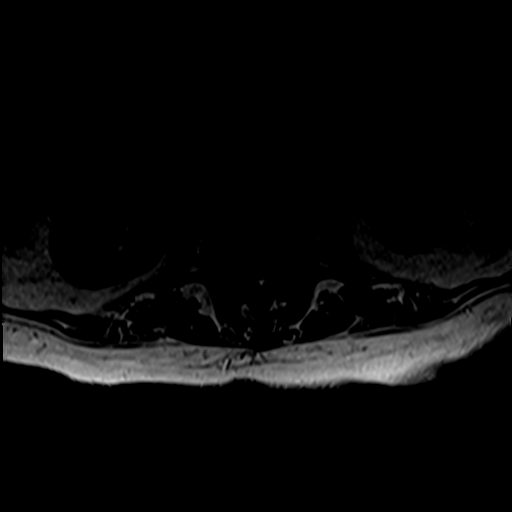
[im 31/38]
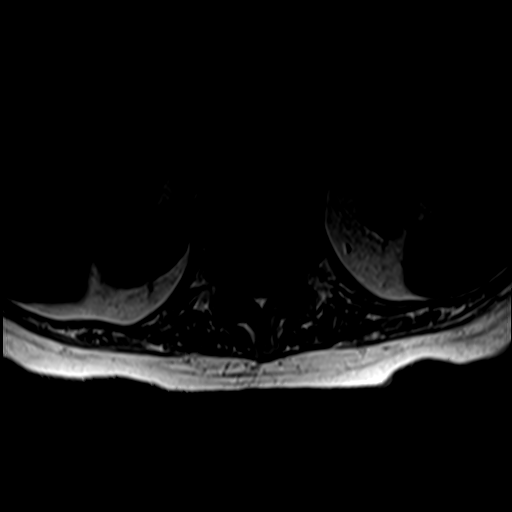
[im 38/38]
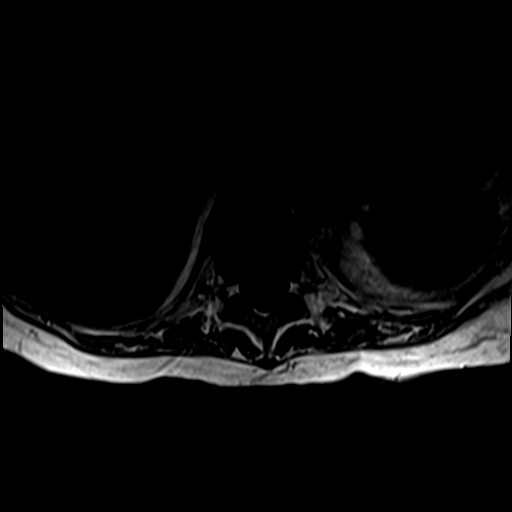

[40 of 48 positions shown; findings below may reference images not displayed]

FINDINGS: MRI THORACIC SPINE FINDINGS

Alignment: 7 cervical vertebral bodies. 11 rib-bearing vertebral
bodies. 5 lumbar type non-rib-bearing vertebral bodies. Normal
thoracic kyphosis.

Vertebrae: T11 vertebral body compression deformity with 70% central
loss of height. No significant vertebral body edema to indicate
recent fracture.

Cord:  Normal signal and morphology.

Paraspinal and other soft tissues: Trace pleural effusions.

Disc levels:

T7-8 small central disc protrusion with anterior cord contact. No
canal stenosis.

T8-9 and T9-10 tiny central protrusions with ventral thecal sac
effacement and without canal stenosis.

T11 superior endplate retropulsion with the ventral thecal sac
effacement and without canal stenosis.

MRI LUMBAR SPINE FINDINGS

Segmentation: 7 cervical vertebral bodies. 11 rib-bearing vertebral
bodies. Five lumbar type non-rib-bearing vertebral bodies.

Alignment:  Physiologic.

Vertebrae: L1 vertebral body mild 30% loss of height and mild
vertebral body edema indicating recent fracture. Within the anterior
superior endplate there is T1 isointense and T2 hyperintense signal
abnormality, likely a focus of acute hemorrhage. Mild paravertebral
edema. No abnormal intervertebral disc signal to suggest discitis.
Bilateral L4-5 facet effusions, likely degenerative.

Conus medullaris and cauda equina: Conus extends to the L1 level.
Conus and cauda equina appear normal.

Paraspinal and other soft tissues: Left kidney interpolar 11 mm
cyst.

Disc levels:

L1-2: L1 vertebral body superior endplate retropulsion with ventral
thecal sac effacement. No canal or foraminal stenosis.

L2-3: Small disc bulge. No significant foraminal or canal stenosis.

L3-4: Small disc bulge. No significant foraminal or canal stenosis.

L4-5: Small disc bulge with mild facet and ligamentum flavum
hypertrophy. Mild foraminal stenosis. No significant canal stenosis.

L5-S1: Small disc bulge and moderate facet hypertrophy. No
significant foraminal or canal stenosis.
IMPRESSION: MR THORACIC SPINE IMPRESSION

1. Eleven rib-bearing vertebral bodies. Five lumbar type
non-rib-bearing vertebral bodies.
2. T11 vertebral body compression deformity with 70% loss of height.
No edema to suggest recent injury.
3. Mild thoracic spine spondylosis with predominantly discogenic
degenerative changes. No significant foraminal or canal stenosis. No
abnormal cord signal.
MR LUMBAR SPINE IMPRESSION

1. L1 compression deformity with mild 30% loss of height and edema
indicating recent fracture. Anterior superior endplate small focus
of hemorrhage. Mild paravertebral edema.
2. L4-5 bilateral facet effusions, likely degenerative.
3. Mild lumbar spondylosis greatest at L4-5 and L5-S1 levels. No
high-grade foraminal or canal stenosis.

By: Wilma Rao M.D.

## 2018-09-19 IMAGING — XA DG LUMBAR SPINE 2-3V
1 series · 1 of 1 positions shown · non-contrast
Comparison: MRI 08/11/2017

CLINICAL DATA: Spine surgery

EXAM:
LUMBAR SPINE - 2-3 VIEW; DG C-ARM 61-120 MIN

[Series 1: cont. · 1 of 1 slices shown]
[im 1/1]
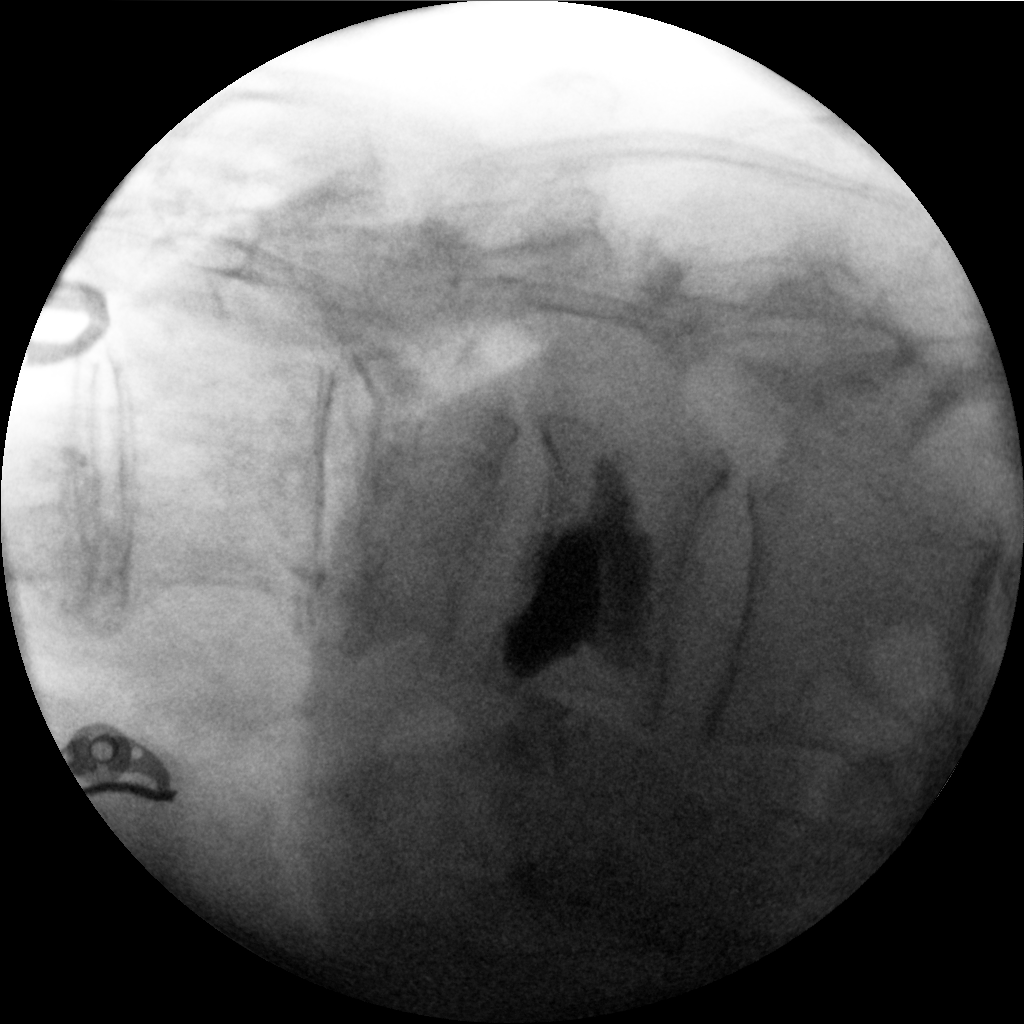

[1 of 1 positions shown; findings below may reference images not displayed]

FINDINGS: Two low resolution intraoperative spot views of the thoracolumbar
region. Images demonstrate vertebral augmentation at what appears to
be L1 level. Total fluoroscopy time was 1 minute
IMPRESSION: Intraoperative fluoroscopic assistance provided during vertebral
augmentation

## 2018-09-26 DIAGNOSIS — D631 Anemia in chronic kidney disease: Secondary | ICD-10-CM | POA: Diagnosis not present

## 2018-09-26 DIAGNOSIS — N183 Chronic kidney disease, stage 3 (moderate): Secondary | ICD-10-CM | POA: Diagnosis not present

## 2018-09-28 ENCOUNTER — Observation Stay
Admit: 2018-09-28 | Discharge: 2018-09-28 | Disposition: A | Discharge status: Home / Self Care | Payer: Medicare Other | Attending: Internal Medicine | Admitting: Internal Medicine

## 2018-09-28 ENCOUNTER — Emergency Department: Payer: Medicare Other

## 2018-09-28 ENCOUNTER — Other Ambulatory Visit: Payer: Self-pay

## 2018-09-28 ENCOUNTER — Observation Stay: Payer: Medicare Other

## 2018-09-28 ENCOUNTER — Observation Stay
Admission: EM | Admit: 2018-09-28 | Discharge: 2018-10-04 | Disposition: A | Discharge status: Skilled Nursing Facility | Payer: Medicare Other | Attending: Internal Medicine | Admitting: Internal Medicine

## 2018-09-28 DIAGNOSIS — E1122 Type 2 diabetes mellitus with diabetic chronic kidney disease: Secondary | ICD-10-CM | POA: Insufficient documentation

## 2018-09-28 DIAGNOSIS — I160 Hypertensive urgency: Secondary | ICD-10-CM | POA: Diagnosis not present

## 2018-09-28 DIAGNOSIS — Z856 Personal history of leukemia: Secondary | ICD-10-CM | POA: Insufficient documentation

## 2018-09-28 DIAGNOSIS — I493 Ventricular premature depolarization: Secondary | ICD-10-CM | POA: Insufficient documentation

## 2018-09-28 DIAGNOSIS — R4701 Aphasia: Secondary | ICD-10-CM | POA: Diagnosis present

## 2018-09-28 DIAGNOSIS — E876 Hypokalemia: Secondary | ICD-10-CM | POA: Insufficient documentation

## 2018-09-28 DIAGNOSIS — Z8249 Family history of ischemic heart disease and other diseases of the circulatory system: Secondary | ICD-10-CM | POA: Insufficient documentation

## 2018-09-28 DIAGNOSIS — I129 Hypertensive chronic kidney disease with stage 1 through stage 4 chronic kidney disease, or unspecified chronic kidney disease: Secondary | ICD-10-CM | POA: Insufficient documentation

## 2018-09-28 DIAGNOSIS — Z8673 Personal history of transient ischemic attack (TIA), and cerebral infarction without residual deficits: Secondary | ICD-10-CM | POA: Insufficient documentation

## 2018-09-28 DIAGNOSIS — Z888 Allergy status to other drugs, medicaments and biological substances status: Secondary | ICD-10-CM | POA: Insufficient documentation

## 2018-09-28 DIAGNOSIS — R479 Unspecified speech disturbances: Secondary | ICD-10-CM | POA: Diagnosis not present

## 2018-09-28 DIAGNOSIS — Z955 Presence of coronary angioplasty implant and graft: Secondary | ICD-10-CM | POA: Insufficient documentation

## 2018-09-28 DIAGNOSIS — G459 Transient cerebral ischemic attack, unspecified: Secondary | ICD-10-CM | POA: Diagnosis not present

## 2018-09-28 DIAGNOSIS — R29818 Other symptoms and signs involving the nervous system: Secondary | ICD-10-CM | POA: Diagnosis not present

## 2018-09-28 DIAGNOSIS — M25551 Pain in right hip: Secondary | ICD-10-CM | POA: Insufficient documentation

## 2018-09-28 DIAGNOSIS — M6281 Muscle weakness (generalized): Secondary | ICD-10-CM | POA: Diagnosis not present

## 2018-09-28 DIAGNOSIS — G319 Degenerative disease of nervous system, unspecified: Secondary | ICD-10-CM | POA: Insufficient documentation

## 2018-09-28 DIAGNOSIS — R4781 Slurred speech: Secondary | ICD-10-CM | POA: Diagnosis not present

## 2018-09-28 DIAGNOSIS — R2689 Other abnormalities of gait and mobility: Secondary | ICD-10-CM | POA: Diagnosis not present

## 2018-09-28 DIAGNOSIS — Z96651 Presence of right artificial knee joint: Secondary | ICD-10-CM | POA: Insufficient documentation

## 2018-09-28 DIAGNOSIS — R9431 Abnormal electrocardiogram [ECG] [EKG]: Secondary | ICD-10-CM | POA: Insufficient documentation

## 2018-09-28 DIAGNOSIS — Z880 Allergy status to penicillin: Secondary | ICD-10-CM | POA: Insufficient documentation

## 2018-09-28 DIAGNOSIS — D569 Thalassemia, unspecified: Secondary | ICD-10-CM | POA: Diagnosis not present

## 2018-09-28 DIAGNOSIS — D631 Anemia in chronic kidney disease: Secondary | ICD-10-CM | POA: Insufficient documentation

## 2018-09-28 DIAGNOSIS — N183 Chronic kidney disease, stage 3 (moderate): Secondary | ICD-10-CM | POA: Insufficient documentation

## 2018-09-28 DIAGNOSIS — M069 Rheumatoid arthritis, unspecified: Secondary | ICD-10-CM | POA: Diagnosis not present

## 2018-09-28 DIAGNOSIS — Z79899 Other long term (current) drug therapy: Secondary | ICD-10-CM | POA: Insufficient documentation

## 2018-09-28 DIAGNOSIS — R42 Dizziness and giddiness: Secondary | ICD-10-CM | POA: Insufficient documentation

## 2018-09-28 DIAGNOSIS — F801 Expressive language disorder: Secondary | ICD-10-CM | POA: Diagnosis not present

## 2018-09-28 DIAGNOSIS — I252 Old myocardial infarction: Secondary | ICD-10-CM | POA: Insufficient documentation

## 2018-09-28 DIAGNOSIS — R531 Weakness: Secondary | ICD-10-CM | POA: Diagnosis not present

## 2018-09-28 DIAGNOSIS — Z885 Allergy status to narcotic agent status: Secondary | ICD-10-CM | POA: Insufficient documentation

## 2018-09-28 DIAGNOSIS — E782 Mixed hyperlipidemia: Secondary | ICD-10-CM | POA: Diagnosis not present

## 2018-09-28 DIAGNOSIS — Z9181 History of falling: Secondary | ICD-10-CM | POA: Insufficient documentation

## 2018-09-28 DIAGNOSIS — I2510 Atherosclerotic heart disease of native coronary artery without angina pectoris: Secondary | ICD-10-CM | POA: Diagnosis not present

## 2018-09-28 DIAGNOSIS — F419 Anxiety disorder, unspecified: Secondary | ICD-10-CM | POA: Diagnosis not present

## 2018-09-28 DIAGNOSIS — Z79890 Hormone replacement therapy (postmenopausal): Secondary | ICD-10-CM | POA: Insufficient documentation

## 2018-09-28 DIAGNOSIS — Z881 Allergy status to other antibiotic agents status: Secondary | ICD-10-CM | POA: Insufficient documentation

## 2018-09-28 DIAGNOSIS — E1151 Type 2 diabetes mellitus with diabetic peripheral angiopathy without gangrene: Secondary | ICD-10-CM | POA: Insufficient documentation

## 2018-09-28 DIAGNOSIS — I10 Essential (primary) hypertension: Secondary | ICD-10-CM | POA: Diagnosis not present

## 2018-09-28 DIAGNOSIS — Z823 Family history of stroke: Secondary | ICD-10-CM | POA: Insufficient documentation

## 2018-09-28 DIAGNOSIS — E039 Hypothyroidism, unspecified: Secondary | ICD-10-CM | POA: Insufficient documentation

## 2018-09-28 DIAGNOSIS — D696 Thrombocytopenia, unspecified: Secondary | ICD-10-CM | POA: Diagnosis not present

## 2018-09-28 DIAGNOSIS — R51 Headache: Secondary | ICD-10-CM | POA: Diagnosis not present

## 2018-09-28 LAB — TROPONIN I: Troponin I: <0.03 ng/mL (ref <0.03)

## 2018-09-28 LAB — COMPREHENSIVE METABOLIC PANEL
ALT: 13 U/L (ref 0–44)
AST: 20 U/L (ref 15–41)
Albumin: 4.2 g/dL (ref 3.5–5.0)
Alkaline Phosphatase: 26 U/L — ABNORMAL LOW (ref 38–126)
Anion gap: 6 (ref 5–15)
BUN: 19 mg/dL (ref 8–23)
CO2: 28 mmol/L (ref 22–32)
Calcium: 9.6 mg/dL (ref 8.9–10.3)
Chloride: 105 mmol/L (ref 98–111)
Creatinine, Ser: 1.1 mg/dL — ABNORMAL HIGH (ref 0.44–1.00)
GFR calc Af Amer: 53 mL/min — ABNORMAL LOW (ref >60)
GFR calc non Af Amer: 46 mL/min — ABNORMAL LOW (ref >60)
Glucose, Bld: 108 mg/dL — ABNORMAL HIGH (ref 70–99)
Potassium: 3.1 mmol/L — ABNORMAL LOW (ref 3.5–5.1)
Sodium: 139 mmol/L (ref 135–145)
Total Bilirubin: 2 mg/dL — ABNORMAL HIGH (ref 0.3–1.2)
Total Protein: 7.2 g/dL (ref 6.5–8.1)

## 2018-09-28 LAB — URINALYSIS, ROUTINE W REFLEX MICROSCOPIC
Bacteria, UA: NONE SEEN
Bilirubin Urine: NEGATIVE
Glucose, UA: NEGATIVE mg/dL
Ketones, ur: NEGATIVE mg/dL
Leukocytes, UA: NEGATIVE
Nitrite: NEGATIVE
Protein, ur: NEGATIVE mg/dL
Specific Gravity, Urine: 1.023 (ref 1.005–1.030)
pH: 7 (ref 5.0–8.0)

## 2018-09-28 LAB — CBC
HCT: 34.9 % — ABNORMAL LOW (ref 36.0–46.0)
Hemoglobin: 10.4 g/dL — ABNORMAL LOW (ref 12.0–15.0)
MCH: 18.7 pg — ABNORMAL LOW (ref 26.0–34.0)
MCHC: 29.8 g/dL — ABNORMAL LOW (ref 30.0–36.0)
MCV: 62.7 fL — ABNORMAL LOW (ref 80.0–100.0)
Platelets: 85 10*3/uL — ABNORMAL LOW (ref 150–400)
RBC: 5.57 MIL/uL — ABNORMAL HIGH (ref 3.87–5.11)
RDW: 16.9 % — ABNORMAL HIGH (ref 11.5–15.5)
WBC: 4.3 10*3/uL (ref 4.0–10.5)
nRBC: 0 % (ref 0.0–0.2)

## 2018-09-28 LAB — DIFFERENTIAL
Abs Immature Granulocytes: 0.02 10*3/uL (ref 0.00–0.07)
Basophils Absolute: 0 10*3/uL (ref 0.0–0.1)
Basophils Relative: 1 %
Eosinophils Absolute: 0 10*3/uL (ref 0.0–0.5)
Eosinophils Relative: 1 %
Immature Granulocytes: 1 %
Lymphocytes Relative: 56 %
Lymphs Abs: 2.5 10*3/uL (ref 0.7–4.0)
Monocytes Absolute: 0.8 10*3/uL (ref 0.1–1.0)
Monocytes Relative: 18 %
Neutro Abs: 1 10*3/uL — ABNORMAL LOW (ref 1.7–7.7)
Neutrophils Relative %: 23 %

## 2018-09-28 LAB — ETHANOL: Alcohol, Ethyl (B): <10 mg/dL (ref <10)

## 2018-09-28 LAB — URINE DRUG SCREEN, QUALITATIVE (ARMC ONLY)
Amphetamines, Ur Screen: NOT DETECTED
Barbiturates, Ur Screen: NOT DETECTED
Benzodiazepine, Ur Scrn: NOT DETECTED
Cannabinoid 50 Ng, Ur ~~LOC~~: NOT DETECTED
Cocaine Metabolite,Ur ~~LOC~~: NOT DETECTED
MDMA (Ecstasy)Ur Screen: NOT DETECTED
Methadone Scn, Ur: NOT DETECTED
Opiate, Ur Screen: NOT DETECTED
Phencyclidine (PCP) Ur S: NOT DETECTED
Tricyclic, Ur Screen: NOT DETECTED

## 2018-09-28 LAB — GLUCOSE, CAPILLARY: Glucose-Capillary: 106 mg/dL — ABNORMAL HIGH (ref 70–99)

## 2018-09-28 LAB — APTT: aPTT: 32 seconds (ref 24–36)

## 2018-09-28 LAB — PROTIME-INR
INR: 1.09
Prothrombin Time: 14 seconds (ref 11.4–15.2)

## 2018-09-28 MED ORDER — ASPIRIN 81 MG PO CHEW
CHEWABLE_TABLET | ORAL | Status: AC
  Filled 2018-09-28: qty 4

## 2018-09-28 MED ORDER — ENOXAPARIN SODIUM 30 MG/0.3ML ~~LOC~~ SOLN
30.0000 mg | SUBCUTANEOUS | Status: DC
  Administered 2018-09-29 – 2018-10-03 (×5): 30 mg via SUBCUTANEOUS
  Filled 2018-09-28 (×5): qty 0.3

## 2018-09-28 MED ORDER — ASPIRIN 300 MG RE SUPP
300.0000 mg | Freq: Every day | RECTAL | Status: DC
  Filled 2018-09-28: qty 1

## 2018-09-28 MED ORDER — IOHEXOL 350 MG/ML SOLN
75.0000 mL | Freq: Once | INTRAVENOUS | Status: AC | PRN
  Administered 2018-09-28: 75 mL via INTRAVENOUS

## 2018-09-28 MED ORDER — METOPROLOL TARTRATE 25 MG PO TABS
25.0000 mg | ORAL_TABLET | Freq: Two times a day (BID) | ORAL | Status: DC
  Administered 2018-09-28 – 2018-10-04 (×10): 25 mg via ORAL
  Filled 2018-09-28 (×12): qty 1

## 2018-09-28 MED ORDER — ASPIRIN 81 MG PO CHEW
324.0000 mg | CHEWABLE_TABLET | Freq: Once | ORAL | Status: AC
  Administered 2018-09-28: 324 mg via ORAL

## 2018-09-28 MED ORDER — CLEVIDIPINE BUTYRATE 0.5 MG/ML IV EMUL
0.0000 mg/h | INTRAVENOUS | Status: DC
  Administered 2018-09-28: 2 mg/h via INTRAVENOUS

## 2018-09-28 MED ORDER — ASPIRIN 325 MG PO TABS
325.0000 mg | ORAL_TABLET | Freq: Every day | ORAL | Status: DC
  Administered 2018-09-29 – 2018-10-04 (×6): 325 mg via ORAL
  Filled 2018-09-28 (×6): qty 1

## 2018-09-28 MED ORDER — SODIUM CHLORIDE 0.9 % IV SOLN: 50.0000 mL | Freq: Once | INTRAVENOUS | Status: DC

## 2018-09-28 MED ORDER — ALTEPLASE (STROKE) FULL DOSE INFUSION
0.9000 mg/kg | Freq: Once | INTRAVENOUS | Status: DC
  Filled 2018-09-28: qty 100

## 2018-09-28 MED ORDER — ONDANSETRON 4 MG PO TBDP
4.0000 mg | ORAL_TABLET | Freq: Three times a day (TID) | ORAL | Status: DC | PRN
  Administered 2018-09-29 – 2018-10-03 (×5): 4 mg via ORAL
  Filled 2018-09-28 (×7): qty 1

## 2018-09-28 MED ORDER — LEVOTHYROXINE SODIUM 50 MCG PO TABS
50.0000 ug | ORAL_TABLET | Freq: Every day | ORAL | Status: DC
  Administered 2018-09-29 – 2018-10-04 (×6): 50 ug via ORAL
  Filled 2018-09-28 (×6): qty 1

## 2018-09-28 MED ORDER — OXYCODONE HCL 5 MG PO TABS
2.5000 mg | ORAL_TABLET | Freq: Four times a day (QID) | ORAL | Status: DC | PRN
  Administered 2018-10-04 (×2): 2.5 mg via ORAL
  Filled 2018-09-28 (×3): qty 1

## 2018-09-28 MED ORDER — STROKE: EARLY STAGES OF RECOVERY BOOK
Freq: Once | Status: AC
  Administered 2018-09-28: 18:00:00
  Filled 2018-09-28: qty 1

## 2018-09-28 MED ORDER — LORAZEPAM 2 MG/ML IJ SOLN
0.5000 mg | Freq: Once | INTRAMUSCULAR | Status: AC
  Administered 2018-09-28: 0.5 mg via INTRAVENOUS
  Filled 2018-09-28: qty 1

## 2018-09-28 MED ORDER — ATORVASTATIN CALCIUM 20 MG PO TABS
80.0000 mg | ORAL_TABLET | Freq: Every day | ORAL | Status: DC
  Administered 2018-09-28 – 2018-10-03 (×6): 80 mg via ORAL
  Filled 2018-09-28 (×6): qty 4

## 2018-09-28 MED ORDER — POTASSIUM CHLORIDE CRYS ER 20 MEQ PO TBCR
40.0000 meq | EXTENDED_RELEASE_TABLET | Freq: Once | ORAL | Status: AC
  Administered 2018-09-28: 40 meq via ORAL
  Filled 2018-09-28: qty 2

## 2018-09-28 MED ORDER — METOPROLOL TARTRATE 5 MG/5ML IV SOLN
5.0000 mg | INTRAVENOUS | Status: DC | PRN
  Administered 2018-09-29 – 2018-10-01 (×2): 5 mg via INTRAVENOUS
  Filled 2018-09-28 (×2): qty 5

## 2018-09-28 MED ORDER — LINACLOTIDE 145 MCG PO CAPS
145.0000 ug | ORAL_CAPSULE | Freq: Every day | ORAL | Status: DC
  Administered 2018-09-29 – 2018-10-03 (×2): 145 ug via ORAL
  Filled 2018-09-28 (×6): qty 1

## 2018-09-28 NOTE — ED Notes (Signed)
Admitting MD at bedside.

## 2018-09-28 NOTE — ED Provider Notes (Signed)
[facility] Emergency Department Provider Note       Time seen: ----------------------------------------- 10:53 AM on 09/28/2018 -----------------------------------------   I have reviewed the triage vital signs and the nursing notes.  HISTORY   Chief Complaint slurred speech    HPI Stephanie Harris is a 85 y.o. female with a history of anxiety, arthritis, asthma, chronic kidney disease, CML, CVA and TIA who presents to the ED for speech disturbance.  Patient was being evaluated by home health today and was thought to have slurred speech.  It is unknown if this was her baseline or not.  She was sent here for further evaluation.  She denies any complaints at this time but does have difficulty speaking.  She is also complaining of chronic low back pain  Past Medical History:  Diagnosis Date  . Acute bronchiolitis   . Acute pharyngitis   . Allergic rhinitis due to pollen   . Anemia, unspecified    CKD and LGL  . Anxiety state    unspecified  . Arthritis    "knees, legs" (03/18/2018)  . Asthma without status asthmaticus    unspecified  . B12 deficiency   . Beta thalassemia trait   . Carotid artery occlusion   . Cellulitis and abscess of leg, except foot   . Cervical spondylosis   . Cervicalgia   . Chronic kidney disease   . Chronic lower back pain   . CML (chronic myelocytic leukemia) ([place])    Onc at [facility]  . Complication of anesthesia    "last back surgery they liked to never get me awake" (03/18/2018)  . Coronary artery disease 2009   heart attack with stent  . Coronary atherosclerosis of native coronary artery   . Depressive disorder    not elsewhere classified  . Difficulty in walking   . Disorder of breast, unspecified   . Dizziness and giddiness   . Dysuria   . Esophageal reflux   . Essential hypertension    unspecified  . Family history of adverse reaction to anesthesia    "liked to never get my sister awake" (03/18/2018)  . Head  injury    unspecified  . Headache    "q time I have a stroke" (03/18/2018)  . Heart disease   . Hematuria, unspecified   . History of blood transfusion    "had 22 when they found out I had leukemia" (03/18/2018)  . Hypersomnia, unspecified   . Hypertension   . Hypopotassemia   . Hypothyroidism   . ICH (intracerebral hemorrhage) ([place])   . Ill-defined cerebrovascular disease    other  . Insomnia    unspecified  . 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 Leukemia ([place])   . Lower urinary tract infection   . Lumbago   . Mini stroke ([place])    x years  . Mixed hyperlipidemia   . Myocardial infarction ([place]) ~2017  . Nontoxic nodular goiter    unspecified  . Occlusion and stenosis of unspecified carotid artery    without mention of cerebral infarction  . Osteoarthritis   . Osteoporosis   . Other constipation   . Other vitamin B12 deficiency anemias   . Otitis media, unspecified, unspecified ear   . Ovarian failure    unspecified  . Pain in limb   . Panic disorder without agoraphobia   . Peripheral vascular disease ([place])    unspecified  . Phlebitis of left arm   . RA (rheumatoid arthritis) ([place])   .  Sigmoid polyp 1998  . Sleep disturbance    unspecified  . Stroke ([place]) 02/2017   "lots of mini strokes; big one 02/2017; that one made me weak in my knees,; never fully recovered" (03/18/2018)  . Syncope and collapse   . Thalassemia   . TIA (transient ischemic attack)    2000 and 2008 right carotid stent 08/14/2009 on Plavix  . TIA (transient ischemic attack) 03/18/2018  . Transient disorder of initiating or maintaining sleep   . Type II diabetes mellitus ([place])   . Varicose veins of bilateral lower extremities with other complications   . Varicose veins of bilateral lower extremities with other complications   . Vision abnormalities     Patient Active Problem List   Diagnosis Date Noted  . Hypoglycemia 08/31/2018  . Nausea 08/31/2018  . B12 deficiency 08/31/2018   . Mini stroke ([place]) 08/30/2018  . Acquired hypothyroidism 07/11/2018  . Dyslipidemia 07/11/2018  . Chronic renal disease, stage III ([place]) 07/11/2018  . Sacral fracture, closed ([place]) 07/11/2018  . Traumatic hematoma of buttock 07/11/2018  . Uses walker 07/07/2018  . Frequent falls 07/02/2018  . Sacral fracture, closed ([place]) 07/01/2018  . Traumatic hematoma of buttock 07/01/2018  . At risk for domestic violence 05/06/2018  . Pain in right foot 04/06/2018  . Cerebrovascular accident (CVA) ([place]) 04/05/2018  . Essential hypertension, benign 04/05/2018  . Carotid artery calcification, bilateral 04/05/2018  . Occlusion and stenosis of both vertebral arteries   . Stenosis of carotid artery   . Hypertensive emergency   . S/P kyphoplasty 12/16/2017  . Age-related osteoporosis with current pathological fracture 11/10/2017  . Protein-calorie malnutrition, severe 11/08/2017  . Lumbar compression fracture ([place]) 11/05/2017  . Spondylosis of lumbar region without myelopathy or radiculopathy 10/08/2017  . Chronic midline low back pain 08/29/2017  . Closed wedge compression fracture of first lumbar vertebra with delayed healing 08/29/2017  . Anemia complicating neoplastic disease 06/02/2017  . Left-sided nontraumatic intracerebral hemorrhage ([place]) 03/23/2017  . Primary localized osteoarthrosis of shoulder, left 12/31/2016  . Primary osteoarthritis of one knee, left 12/31/2016  . Closed fracture of proximal end of left humerus with routine healing 08/28/2016  . Gross hematuria 06/25/2016  . Incomplete emptying of bladder 06/25/2016  . Moderate mitral insufficiency 10/31/2015  . Syncope and collapse 10/31/2015  . Continuous leakage of urine 08/30/2015  . Frequent UTI 08/30/2015  . Muscle weakness of lower extremity 03/20/2015  . Chest pain 03/14/2015  . TIA (transient ischemic attack) 01/29/2015  . Allergic arthritis, hand   . Adhesive capsulitis of left shoulder 12/13/2014  . SI joint  arthritis 07/05/2014  . Mixed hyperlipidemia 05/22/2014  . Temporary cerebral vascular dysfunction 05/22/2014  . Irritable bowel syndrome with constipation 05/03/2014  . Dysphagia 05/03/2014  . Weight loss 05/03/2014  . Bilateral carotid artery disease ([place]) 04/18/2014  . Carotid atherosclerosis 04/18/2014  . Cerebral infarction, unspecified ([place]) 04/18/2014  . Cerebral artery occlusion with cerebral infarction ([place]) 01/11/2014  . Coronary atherosclerosis 01/11/2014  . Closed fracture of lumbar vertebra ([place]) 12/19/2013  . Non-traumatic compression fracture of second lumbar vertebra ([place]) 12/19/2013  . Fall in home 05/19/2013  . Compression fracture of T12 vertebra ([place]) 04/27/2013  . Closed fracture of dorsal (thoracic) vertebra ([place]) 04/27/2013  . 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 Anemia due to stage 3 chronic kidney disease ([place]) 06/15/2012  . Lymphoid leukemia ([place]) 06/15/2012  . Unspecified visual loss 05/20/2012  . Degenerative drusen 05/06/2012  . Drusen, retina 05/06/2012  Past Surgical History:  Procedure Laterality Date  . BACK SURGERY    . CAROTID ENDARTERECTOMY Left   . CAROTID STENT INSERTION Right    "have 2 stents in there" (03/18/2018)  . CATARACT EXTRACTION W/ INTRAOCULAR LENS  IMPLANT, BILATERAL Bilateral 06/16/2001 - 05/28/2003   +23.5D     22.5D  . CHOLECYSTECTOMY OPEN  1980  . COLONOSCOPY  2010  . CORONARY ANGIOPLASTY WITH STENT PLACEMENT    . EYELID SURGERY Bilateral 2005   BUL BLEPH  . FRACTURE SURGERY    . JOINT REPLACEMENT    . KYPHOPLASTY N/A 08/12/2017   Procedure: KYPHOPLASTY;  Surgeon: Hessie Knows, MD;  Location: [facility] ORS;  Service: Orthopedics;  Laterality: N/A;  . KYPHOPLASTY N/A 11/08/2017   Procedure: Hewitt Shorts;  Surgeon: Hessie Knows, MD;  Location: [facility] ORS;  Service: Orthopedics;  Laterality: N/A;  . PERCUTANEOUS PLACEMENT INTRAVASCULAR STENT CERVICAL CAROTID ARTERY  06/14/2009  . ROTATOR CUFF REPAIR  Left   . TOTAL ABDOMINAL HYSTERECTOMY  1978   WITH REMOVAL TUBES & /OR OVARIES  . TOTAL KNEE ARTHROPLASTY Right     Allergies Latex; Meperidine; Penicillins; Cefuroxime axetil; Codeine; and Propoxyphene  Social History Social History   Tobacco Use  . Smoking status: Never Smoker  . Smokeless tobacco: Never Used  Substance Use Topics  . Alcohol use: No  . Drug use: No   Review of Systems Constitutional: Negative for fever. Cardiovascular: Negative for chest pain. Respiratory: Negative for shortness of breath. Gastrointestinal: Negative for abdominal pain, vomiting and diarrhea. Genitourinary: Negative for dysuria. Musculoskeletal: Positive for chronic back pain Skin: Negative for rash. Neurological: Positive for speech disturbance  All systems negative/normal/unremarkable except as stated in the HPI  ____________________________________________   PHYSICAL EXAM:  VITAL SIGNS: ED Triage Vitals  Enc Vitals Group     BP      Pulse      Resp      Temp      Temp src      SpO2      Weight      Height      Head Circumference      Peak Flow      Pain Score      Pain Loc      Pain Edu?      Excl. in [place]?    Constitutional: Alert, well appearing and in mild distress Eyes: Conjunctivae are normal. Normal extraocular movements. ENT      Head: Normocephalic and atraumatic.      Nose: No congestion/rhinnorhea.      Mouth/Throat: Mucous membranes are moist.      Neck: No stridor. Cardiovascular: Normal rate, regular rhythm. No murmurs, rubs, or gallops. Respiratory: Normal respiratory effort without tachypnea nor retractions. Breath sounds are clear and equal bilaterally. No wheezes/rales/rhonchi. Gastrointestinal: Soft and nontender. Normal bowel sounds Musculoskeletal: Nontender with normal range of motion in extremities. No lower extremity tenderness nor edema. Neurologic: Expressive aphasia is noted, patient was unable to formulate words correctly.  She does not  have any slurred speech, strength, sensation, cranial nerves otherwise appear to be normal Skin:  Skin is warm, dry and intact. No rash noted. Psychiatric: Mood and affect are normal. Speech and behavior are normal.  ____________________________________________  EKG: Interpreted by me.  Sinus rhythm the rate of 73 bpm, artifact is noted, leftward axis, normal QT.  ____________________________________________  ED COURSE:  As part of my medical decision making, I reviewed the following data within the [facility] History  obtained from family if available, nursing notes, old chart and ekg, as well as notes from prior ED visits. Patient presented for expressive aphasia, we will assess with labs and imaging as indicated at this time.   Procedures ____________________________________________   LABS (pertinent positives/negatives)  Labs Reviewed  CBC - Abnormal; Notable for the following components:      Result Value   RBC 5.57 (*)    Hemoglobin 10.4 (*)    HCT 34.9 (*)    MCV 62.7 (*)    MCH 18.7 (*)    MCHC 29.8 (*)    RDW 16.9 (*)    Platelets 85 (*)    All other components within normal limits  DIFFERENTIAL - Abnormal; Notable for the following components:   Neutro Abs 1.0 (*)    All other components within normal limits  COMPREHENSIVE METABOLIC PANEL - Abnormal; Notable for the following components:   Potassium 3.1 (*)    Glucose, Bld 108 (*)    Creatinine, Ser 1.10 (*)    Alkaline Phosphatase 26 (*)    Total Bilirubin 2.0 (*)    GFR calc non Af Amer 46 (*)    GFR calc Af Amer 53 (*)    All other components within normal limits  GLUCOSE, CAPILLARY - Abnormal; Notable for the following components:   Glucose-Capillary 106 (*)    All other components within normal limits  ETHANOL  PROTIME-INR  APTT  TROPONIN I  URINE DRUG SCREEN, QUALITATIVE ([facility] ONLY)  URINALYSIS, ROUTINE W REFLEX MICROSCOPIC   CRITICAL CARE Performed by: Laurence Aly   Total critical care time: 30 minutes  Critical care time was exclusive of separately billable procedures and treating other patients.  Critical care was necessary to treat or prevent imminent or life-threatening deterioration.  Critical care was time spent personally by me on the following activities: development of treatment plan with patient and/or surrogate as well as nursing, discussions with consultants, evaluation of patient's response to treatment, examination of patient, obtaining history from patient or surrogate, ordering and performing treatments and interventions, ordering and review of laboratory studies, ordering and review of radiographic studies, pulse oximetry and re-evaluation of patient's condition.  RADIOLOGY Images were viewed by me  CT head  IMPRESSION: 1. No acute intracranial abnormality. 2. Atrophy and extensive chronic ischemic change. 3. ASPECTS is 10 4. These results were called by telephone at the time of interpretation on 09/28/2018 at 11:29 am to Dr. Lenise Arena , who verbally acknowledged these results.  ____________________________________________   DIFFERENTIAL DIAGNOSIS   CVA, TIA, dehydration, electrolyte abnormality  FINAL ASSESSMENT AND PLAN  Expressive aphasia   Plan: The patient had presented for speech disturbance with apparent aphasia on arrival. Patient's labs did reveal some chronic abnormalities, platelet count was low which was somewhat of a contraindication for TPA in addition to her elevated blood pressure. Patient's imaging not reveal any acute process.  She will need to be admitted for continued stroke work-up, will receive IV blood pressure control   Laurence Aly, MD    Note: This note was generated in part or whole with voice recognition software. Voice recognition is usually quite accurate but there are transcription errors that can and very often do occur. I apologize for any typographical errors  that were not detected and corrected.     [name], MD 09/28/18 1311

## 2018-09-28 NOTE — Consult Note (Signed)
Referring Physician: Dr. Jimmye Norman ( ED)   History obtained from the patient and her husband at bedside     Reason for Consult: Acute dysphasia and difficulty speaking  HPI: AMI Stephanie Harris is an 85 y.o. female with past medical history significant for CAD, CML with thrombocytopenia, bilateral carotid disease status post right carotid stent, hypertension, and anxiety, chronic kidney disease who presented to the hospital with acute onset of difficulty speaking.   Patient got up this morning and talked to her husband with a normal state of health at 8 AM, at 9:45 AM patient started having difficulty expressing herself with a slurred speech, 911 was called and EMS arrived by that time patient symptoms has resolved, patient was brought to the emergency room for acute stroke management and stroke code was called, by the time patient was evaluated in the emergency room she was still complaining about mild expressive aphasia however her systolic blood pressure was over 200. Patient denied any focal motor weakness, she denied any numbness, she denies any loss of consciousness or seizure-like activities, she denied any visual changes. Patient denied chest pain, she denies shortness of breath, patient was complaining about bilateral lower extremity pain and numbness. In the emergency room patient systolic blood pressure was over 200, initial evaluation showed that patient had no contraindication for IV TPA apart from the the elevated blood pressure, patient was started on Cleviprex drip for aggressive management of her systolic blood pressure, CT scan of the brain showed no acute intracranial abnormalities, initial NIH stroke scale was 8, patient systolic blood pressure continue to be in the 200s on Cleviprex drip, held labs showed that she has thrombocytopenia with platelet count less than 85, for that reason IV TPA was canceled, CT angiogram was ordered to rule out large vessel occlusion, which showed an open  right carotid stent and multiple intracranial atherosclerosis disease but no large vessel occlusion.  Patient was admitted to the hospital for stroke work-up  Date last known well: 09/28/2018 Time last known well: 945 AM  tPA Given: not given due to thrombocytopenia and elevated SBP>200.  Past Medical History Past Medical History:  Diagnosis Date  . Acute bronchiolitis   . Acute pharyngitis   . Allergic rhinitis due to pollen   . Anemia, unspecified    CKD and LGL  . Anxiety state    unspecified  . Arthritis    "knees, legs" (03/18/2018)  . Asthma without status asthmaticus    unspecified  . B12 deficiency   . Beta thalassemia trait   . Carotid artery occlusion   . Cellulitis and abscess of leg, except foot   . Cervical spondylosis   . Cervicalgia   . Chronic kidney disease   . Chronic lower back pain   . CML (chronic myelocytic leukemia) ([place])    Onc at [facility]  . Complication of anesthesia    "last back surgery they liked to never get me awake" (03/18/2018)  . Coronary artery disease 2009   heart attack with stent  . Coronary atherosclerosis of native coronary artery   . Depressive disorder    not elsewhere classified  . Difficulty in walking   . Disorder of breast, unspecified   . Dizziness and giddiness   . Dysuria   . Esophageal reflux   . Essential hypertension    unspecified  . Family history of adverse reaction to anesthesia    "liked to never get my sister awake" (03/18/2018)  . Head injury  unspecified  . Headache    "q time I have a stroke" (03/18/2018)  . Heart disease   . Hematuria, unspecified   . History of blood transfusion    "had 22 when they found out I had leukemia" (03/18/2018)  . Hypersomnia, unspecified   . Hypertension   . Hypopotassemia   . Hypothyroidism   . ICH (intracerebral hemorrhage) ([place])   . Ill-defined cerebrovascular disease    other  . Insomnia    unspecified  . 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 Leukemia  ([place])   . Lower urinary tract infection   . Lumbago   . Mini stroke ([place])    x years  . Mixed hyperlipidemia   . Myocardial infarction ([place]) ~2017  . Nontoxic nodular goiter    unspecified  . Occlusion and stenosis of unspecified carotid artery    without mention of cerebral infarction  . Osteoarthritis   . Osteoporosis   . Other constipation   . Other vitamin B12 deficiency anemias   . Otitis media, unspecified, unspecified ear   . Ovarian failure    unspecified  . Pain in limb   . Panic disorder without agoraphobia   . Peripheral vascular disease ([place])    unspecified  . Phlebitis of left arm   . RA (rheumatoid arthritis) ([place])   . Sigmoid polyp 1998  . Sleep disturbance    unspecified  . Stroke ([place]) 02/2017   "lots of mini strokes; big one 02/2017; that one made me weak in my knees,; never fully recovered" (03/18/2018)  . Syncope and collapse   . Thalassemia   . TIA (transient ischemic attack)    2000 and 2008 right carotid stent 08/14/2009 on Plavix  . TIA (transient ischemic attack) 03/18/2018  . Transient disorder of initiating or maintaining sleep   . Type II diabetes mellitus ([place])   . Varicose veins of bilateral lower extremities with other complications   . Varicose veins of bilateral lower extremities with other complications   . Vision abnormalities     Surgical History Past Surgical History:  Procedure Laterality Date  . BACK SURGERY    . CAROTID ENDARTERECTOMY Left   . CAROTID STENT INSERTION Right    "have 2 stents in there" (03/18/2018)  . CATARACT EXTRACTION W/ INTRAOCULAR LENS  IMPLANT, BILATERAL Bilateral 06/16/2001 - 05/28/2003   +23.5D     22.5D  . CHOLECYSTECTOMY OPEN  1980  . COLONOSCOPY  2010  . CORONARY ANGIOPLASTY WITH STENT PLACEMENT    . EYELID SURGERY Bilateral 2005   BUL BLEPH  . FRACTURE SURGERY    . JOINT REPLACEMENT    . KYPHOPLASTY N/A 08/12/2017   Procedure: KYPHOPLASTY;  Surgeon: Hessie Knows, MD;  Location: [facility] ORS;   Service: Orthopedics;  Laterality: N/A;  . KYPHOPLASTY N/A 11/08/2017   Procedure: Hewitt Shorts;  Surgeon: Hessie Knows, MD;  Location: [facility] ORS;  Service: Orthopedics;  Laterality: N/A;  . PERCUTANEOUS PLACEMENT INTRAVASCULAR STENT CERVICAL CAROTID ARTERY  06/14/2009  . ROTATOR CUFF REPAIR Left   . TOTAL ABDOMINAL HYSTERECTOMY  1978   WITH REMOVAL TUBES & /OR OVARIES  . TOTAL KNEE ARTHROPLASTY Right     Family History  Family History  Problem Relation Age of Onset  . Acute myelogenous leukemia Brother   . Anemia Brother   . Coronary artery disease Brother   . Heart disease Brother   . Stroke Brother   . Heart failure Brother   . Stroke Mother   . Anemia  Mother   . Heart disease Mother   . Heart failure Mother   . Hypertension Mother   . Osteoarthritis Mother   . Rheum arthritis Mother   . Heart attack Father   . Anemia Sister   . Cataracts Sister   . Osteoarthritis Sister   . Rheum arthritis Sister   . Stroke Sister   . Heart disease Sister   . Thalassemia Sister   . Thalassemia Sister   . Blindness Neg Hx   . Glaucoma Neg Hx   . Macular degeneration Neg Hx   . Strabismus Neg Hx   . Vision loss Neg Hx   . Basal cell carcinoma Neg Hx   . GU problems Neg Hx   . Kidney cancer Neg Hx   . Melanoma Neg Hx   . Kidney disease Neg Hx   . Prostate cancer Neg Hx   . Squamous cell carcinoma Neg Hx     Social History:   reports that she has never smoked. She has never used smokeless tobacco. She reports that she does not drink alcohol or use drugs.  Allergies:  Allergies  Allergen Reactions  . Latex Rash  . Meperidine     Other reaction(s): Other (See Comments) Other Reaction: CNS Disorder  . Penicillins Other (See Comments)    Has patient had a PCN reaction causing immediate rash, facial/tongue/throat swelling, SOB or lightheadedness with hypotension: Unknown Has patient had a PCN reaction causing severe rash involving mucus membranes or skin necrosis: Unknown Has  patient had a PCN reaction that required hospitalization: Unknown Has patient had a PCN reaction occurring within the last 10 years: Unknown If all of the above answers are "NO", then may proceed with Cephalosporin use.   . Cefuroxime Axetil Nausea And Vomiting  . Codeine Nausea And Vomiting and Nausea Only  . Propoxyphene Nausea Only    Other reaction(s): Vomiting    Home Medications:  (Not in a hospital admission)   Hospital Medications   ROS: Negative except for what is reported in the HPI      Physical Examination:  Vitals:   09/28/18 1139 09/28/18 1140 09/28/18 1141 09/28/18 1145  BP:  (!) 181/65  (!) 201/71  Pulse:   78 83  Resp: (!) 23 17 (!) 23 (!) 26  Temp:      TempSrc:      SpO2:   100% 99%  Weight:        General - NAD, crying at times Heart - Regular rate and rhythm - no murmer appreciated Lungs - Clear to auscultation  Abdomen - Soft - non tender Extremities - Distal pulses intact - no edema Skin - Warm and dry   Neurologic Examination:  Mental Status: Alert, oriented to self, follows commands with expressive aphasia and slurred speech  Visual fields grossly normal, pupils equal, round, reactive to light, smile symmetric, facial light touch sensation decreased on R, midline tongue extension Motor: RUE - 4/5    LUE - 4/5   RLE - 3/5 due to pain     LLE - 3/5 due to pain  normal finger-to-nose and normal heel-to-shin test Gait: deferred at this time.   NIHSS 8 1a Level of Conscious:0 1b LOC Questions: 1 1c LOC Commands: 0 2 Best Gaze: 0 3 Visual: 0 4 Facial Palsy: 0 5a Motor Arm - left: 0 5b Motor Arm - Right: 0 6a Motor Leg - Left: 2 6b Motor Leg - Right: 2 7 Limb Ataxia: 0 8  Sensory: 1 9 Best Language: 1 10 Dysarthria:1 11 Extinct. and Inattention:0 TOTAL: 8   LABORATORY STUDIES:  Basic Metabolic Panel: Recent Labs  Lab 09/28/18 1103  NA 139  K 3.1*  CL 105  CO2 28  GLUCOSE 108*  BUN 19  CREATININE 1.10*  CALCIUM 9.6     Liver Function Tests: Recent Labs  Lab 09/28/18 1103  AST 20  ALT 13  ALKPHOS 26*  BILITOT 2.0*  PROT 7.2  ALBUMIN 4.2   No results for input(s): LIPASE, AMYLASE in the last 168 hours. No results for input(s): AMMONIA in the last 168 hours.  CBC: Recent Labs  Lab 09/28/18 1103  WBC 4.3  NEUTROABS 1.0*  HGB 10.4*  HCT 34.9*  MCV 62.7*  PLT 85*    Cardiac Enzymes: Recent Labs  Lab 09/28/18 1103  TROPONINI <0.03    BNP: Invalid input(s): POCBNP  CBG: Recent Labs  Lab 09/28/18 1125  GLUCAP 106*    Microbiology:   Coagulation Studies: Recent Labs    09/28/18 1103  LABPROT 14.0  INR 1.09    Urinalysis: No results for input(s): COLORURINE, LABSPEC, PHURINE, GLUCOSEU, HGBUR, BILIRUBINUR, KETONESUR, PROTEINUR, UROBILINOGEN, NITRITE, LEUKOCYTESUR in the last 168 hours.  Invalid input(s): APPERANCEUR  Lipid Panel:     Component Value Date/Time   CHOL 127 03/19/2018 0358   TRIG 52 03/19/2018 0358   HDL 46 03/19/2018 0358   CHOLHDL 2.8 03/19/2018 0358   VLDL 10 03/19/2018 0358   LDLCALC 71 03/19/2018 0358    HgbA1C:  Lab Results  Component Value Date   HGBA1C 5.0 08/30/2018    Urine Drug Screen:  No results found for: LABOPIA, COCAINSCRNUR, LABBENZ, AMPHETMU, THCU, LABBARB   Alcohol Level:  Recent Labs  Lab 09/28/18 1103  ETH <10    Miscellaneous labs:  EKG  EKG   IMAGING: Ct Angio Head W Or Wo Contrast  Result Date: 09/28/2018 CLINICAL DATA:  Follow-up code stroke. Slurred speech. Mental status changes. EXAM: CT ANGIOGRAPHY HEAD AND NECK TECHNIQUE: Multidetector CT imaging of the head and neck was performed using the standard protocol during bolus administration of intravenous contrast. Multiplanar CT image reconstructions and MIPs were obtained to evaluate the vascular anatomy. Carotid stenosis measurements (when applicable) are obtained utilizing NASCET criteria, using the distal internal carotid diameter as the denominator.  CONTRAST:  68mL OMNIPAQUE IOHEXOL 350 MG/ML SOLN COMPARISON:  Head CT same day.  Multiple previous studies 2019. FINDINGS: CTA NECK FINDINGS Aortic arch: The aorta shows atherosclerotic change but no aneurysm or dissection. Branching pattern is normal. Right carotid system: Common carotid artery is tortuous but widely patent to the bifurcation. There is a carotid stent from the distal common carotid artery into the internal carotid artery. The stent appears patent without evidence of significant stenosis. There may be mild intimal thickening in the midportion. Beyond the stent, the internal carotid artery is widely patent to the skull base. Left carotid system: Common carotid artery is tortuous but widely patent to the bifurcation. There is calcified plaque at the carotid bifurcation and ICA bulb. Minimal diameter in the ICA bulb measures 3.5 mm. Compared to a more distal cervical ICA diameter of 4 mm, this represents only a 10-20% stenosis. Cervical ICA widely patent beyond that. Vertebral arteries: Calcified plaque at the left vertebral artery origin with stenosis of 50%. Beyond the origin, the vessel is widely patent to the foramen magnum. Right vertebral artery origin is widely patent. There is focal calcified plaque just distal to the  origin with stenosis estimated at 75%. Beyond that, the vessel is widely patent through the cervical region to the foramen magnum. Skeleton: Ordinary spondylosis. Other neck: No mass or lymphadenopathy. Upper chest: Mild pulmonary scarring and dependent atelectasis. Review of the MIP images confirms the above findings CTA HEAD FINDINGS Anterior circulation: Both internal carotid arteries are widely patent through the skull base and siphon regions. There is ordinary siphon atherosclerotic calcification but no stenosis greater than 20%. The anterior and middle cerebral vessels are patent without large or medium vessel occlusion. No correctable proximal stenosis. Posterior  circulation: Both vertebral arteries are patent through the foramen magnum to the basilar. No basilar stenosis. Posterior circulation branch vessels appear patent. There is mild distal vessel atherosclerotic irregularity. Venous sinuses: Patent and normal. Anatomic variants: None significant. Delayed phase: No abnormal enhancement. Review of the MIP images confirms the above findings IMPRESSION: Right carotid stent is patent without significant stenosis. There is probably mild intimal thickening in the midportion of the stent. Atherosclerotic disease at the left carotid bifurcation. Narrowing in the ICA bulb is only on the order of 10-20% when compared to the diameter of the more distal cervical ICA. 50% stenosis of the left vertebral artery origin, with wide patency beyond that. 75% stenosis just distal to the right vertebral artery origin with wide patency beyond that. No intracranial large or medium vessel occlusion or correctable proximal stenosis. Electronically Signed   By: Nelson Chimes M.D.   On: 09/28/2018 12:38   Ct Angio Neck W And/or Wo Contrast  Result Date: 09/28/2018 CLINICAL DATA:  Follow-up code stroke. Slurred speech. Mental status changes. EXAM: CT ANGIOGRAPHY HEAD AND NECK TECHNIQUE: Multidetector CT imaging of the head and neck was performed using the standard protocol during bolus administration of intravenous contrast. Multiplanar CT image reconstructions and MIPs were obtained to evaluate the vascular anatomy. Carotid stenosis measurements (when applicable) are obtained utilizing NASCET criteria, using the distal internal carotid diameter as the denominator. CONTRAST:  12mL OMNIPAQUE IOHEXOL 350 MG/ML SOLN COMPARISON:  Head CT same day.  Multiple previous studies 2019. FINDINGS: CTA NECK FINDINGS Aortic arch: The aorta shows atherosclerotic change but no aneurysm or dissection. Branching pattern is normal. Right carotid system: Common carotid artery is tortuous but widely patent to the  bifurcation. There is a carotid stent from the distal common carotid artery into the internal carotid artery. The stent appears patent without evidence of significant stenosis. There may be mild intimal thickening in the midportion. Beyond the stent, the internal carotid artery is widely patent to the skull base. Left carotid system: Common carotid artery is tortuous but widely patent to the bifurcation. There is calcified plaque at the carotid bifurcation and ICA bulb. Minimal diameter in the ICA bulb measures 3.5 mm. Compared to a more distal cervical ICA diameter of 4 mm, this represents only a 10-20% stenosis. Cervical ICA widely patent beyond that. Vertebral arteries: Calcified plaque at the left vertebral artery origin with stenosis of 50%. Beyond the origin, the vessel is widely patent to the foramen magnum. Right vertebral artery origin is widely patent. There is focal calcified plaque just distal to the origin with stenosis estimated at 75%. Beyond that, the vessel is widely patent through the cervical region to the foramen magnum. Skeleton: Ordinary spondylosis. Other neck: No mass or lymphadenopathy. Upper chest: Mild pulmonary scarring and dependent atelectasis. Review of the MIP images confirms the above findings CTA HEAD FINDINGS Anterior circulation: Both internal carotid arteries are widely patent through the skull  base and siphon regions. There is ordinary siphon atherosclerotic calcification but no stenosis greater than 20%. The anterior and middle cerebral vessels are patent without large or medium vessel occlusion. No correctable proximal stenosis. Posterior circulation: Both vertebral arteries are patent through the foramen magnum to the basilar. No basilar stenosis. Posterior circulation branch vessels appear patent. There is mild distal vessel atherosclerotic irregularity. Venous sinuses: Patent and normal. Anatomic variants: None significant. Delayed phase: No abnormal enhancement. Review of  the MIP images confirms the above findings IMPRESSION: Right carotid stent is patent without significant stenosis. There is probably mild intimal thickening in the midportion of the stent. Atherosclerotic disease at the left carotid bifurcation. Narrowing in the ICA bulb is only on the order of 10-20% when compared to the diameter of the more distal cervical ICA. 50% stenosis of the left vertebral artery origin, with wide patency beyond that. 75% stenosis just distal to the right vertebral artery origin with wide patency beyond that. No intracranial large or medium vessel occlusion or correctable proximal stenosis. Electronically Signed   By: Nelson Chimes M.D.   On: 09/28/2018 12:38   Ct Head Code Stroke Wo Contrast  Result Date: 09/28/2018 CLINICAL DATA:  Code stroke.  Slurred speech, aphasia EXAM: CT HEAD WITHOUT CONTRAST TECHNIQUE: Contiguous axial images were obtained from the base of the skull through the vertex without intravenous contrast. COMPARISON:  CT head 06/04/2018 FINDINGS: Brain: Moderate atrophy. Extensive chronic microvascular ischemic change throughout the white matter. Chronic infarct right frontal lobe. Small chronic infarcts cerebellum bilaterally unchanged. Negative for acute infarct. Negative for hemorrhage or mass. No midline shift. Vascular: Negative for hyperdense vessel. Atherosclerotic disease in the cavernous carotid bilaterally. Skull: Negative Sinuses/Orbits: Negative Other: None ASPECTS ([place] Stroke Program Early CT Score) - Ganglionic level infarction (caudate, lentiform nuclei, internal capsule, insula, M1-M3 cortex): 7 - Supraganglionic infarction (M4-M6 cortex): 3 Total score (0-10 with 10 being normal): 10 IMPRESSION: 1. No acute intracranial abnormality. 2. Atrophy and extensive chronic ischemic change. 3. ASPECTS is 10 4. These results were called by telephone at the time of interpretation on 09/28/2018 at 11:29 am to Dr. Lenise Arena , who verbally acknowledged these  results. Electronically Signed   By: Franchot Gallo M.D.   On: 09/28/2018 11:30      Assessment: 85 y.o. female with past medical history significant for CAD, CML with thrombocytopenia, bilateral carotid disease status post right carotid stent, hypertension, and anxiety, chronic kidney disease who presented to the hospital with acute onset of difficulty speaking started this morning, pt was not given IVTPA due to thrombocytopenia and an elevated SBP>200.   DD: Acute left hemispheric ischemic event versus hypertensive urgency.   Plan:  CT angiogram head and neck reviewed and showed an open right carotid stent multiple intracranial atherosclerosis disease but no large vessel occlusion.  Stroke work-up including MRI of the brain, lipid profile, echocardiogram, hemoglobin A1c, PT OT and speech evaluation.  Continue aspirin daily, Lipitor 80 mg at bedside  Hypertensive urgency management per medicine team  Statin Therapy goal LDL goal < 70  Risk factor modification  Telemetry monitoring  Frequent neuro checks  Fall Precautions  DVT prophelaxis  NPO until swallowing evaluation has been passed (aspirin suppository if needed)  Plan discussed in details with the patient and her husband at bedside, plan also discussed with the ED staff.     Arnaldo Natal, MD

## 2018-09-28 NOTE — ED Notes (Signed)
Report given to Tom RN.

## 2018-09-28 NOTE — ED Notes (Signed)
Per MD [facility] Neurologist pt's platelets low, no TPA at this time. Information being relayed to pt at this time.

## 2018-09-28 NOTE — ED Notes (Signed)
Informed at 1107 to activated code stroke called to 333 at 1108

## 2018-09-28 NOTE — ED Notes (Signed)
ED TO INPATIENT HANDOFF REPORT  Name/Age/Gender Stephanie Harris 85 y.o. female  Code Status Code Status History    Date Active Date Inactive Code Status Order ID Comments User Context   03/18/2018 1739 03/22/2018 1539 Full Code [id number]  Steve Rattler, DO Inpatient   11/06/2017 0101 11/09/2017 1704 Full Code [id number]  Amelia Jo, MD Inpatient   08/11/2017 0733 08/13/2017 2053 Full Code [id number]  Hillary Bow, MD ED   04/28/2016 1820 04/30/2016 1721 Full Code [id number]  [name], MD Inpatient   03/14/2015 1255 03/15/2015 1808 Full Code [id number]  [name], MD Inpatient   01/29/2015 0527 01/30/2015 1724 Full Code [id number]  Harrie Foreman, MD Inpatient      Home/SNF/Other Home  Chief Complaint slurred speech  Level of Care/Admitting Diagnosis ED Disposition    ED Disposition Condition [place]: [place] [100120]  Level of Care: Telemetry [5]  Diagnosis: TIA (transient ischemic attack) [314970]  Admitting Physician: [name] [[id number]]  Attending Physician: [name] [[id number]]  Bed request comments: 2a  PT Class (Do Not Modify): Observation [104]  PT Acc Code (Do Not Modify): Observation [10022]       Medical History Past Medical History:  Diagnosis Date  . Acute bronchiolitis   . Acute pharyngitis   . Allergic rhinitis due to pollen   . Anemia, unspecified    CKD and LGL  . Anxiety state    unspecified  . Arthritis    "knees, legs" (03/18/2018)  . Asthma without status asthmaticus    unspecified  . B12 deficiency   . Beta thalassemia trait   . Carotid artery occlusion   . Cellulitis and abscess of leg, except foot   . Cervical spondylosis   . Cervicalgia   . Chronic kidney disease   . Chronic lower back pain   . CML (chronic myelocytic leukemia) ([place])    Onc at [facility]  . Complication of anesthesia    "last back surgery they liked to never get me awake" (03/18/2018)  . Coronary artery disease 2009    heart attack with stent  . Coronary atherosclerosis of native coronary artery   . Depressive disorder    not elsewhere classified  . Difficulty in walking   . Disorder of breast, unspecified   . Dizziness and giddiness   . Dysuria   . Esophageal reflux   . Essential hypertension    unspecified  . Family history of adverse reaction to anesthesia    "liked to never get my sister awake" (03/18/2018)  . Head injury    unspecified  . Headache    "q time I have a stroke" (03/18/2018)  . Heart disease   . Hematuria, unspecified   . History of blood transfusion    "had 22 when they found out I had leukemia" (03/18/2018)  . Hypersomnia, unspecified   . Hypertension   . Hypopotassemia   . Hypothyroidism   . ICH (intracerebral hemorrhage) ([place])   . Ill-defined cerebrovascular disease    other  . Insomnia    unspecified  . 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 Leukemia ([place])   . Lower urinary tract infection   . Lumbago   . Mini stroke ([place])    x years  . Mixed hyperlipidemia   . Myocardial infarction ([place]) ~2017  . Nontoxic nodular goiter    unspecified  . Occlusion and stenosis of unspecified carotid artery    without mention of  cerebral infarction  . Osteoarthritis   . Osteoporosis   . Other constipation   . Other vitamin B12 deficiency anemias   . Otitis media, unspecified, unspecified ear   . Ovarian failure    unspecified  . Pain in limb   . Panic disorder without agoraphobia   . Peripheral vascular disease ([place])    unspecified  . Phlebitis of left arm   . RA (rheumatoid arthritis) ([place])   . Sigmoid polyp 1998  . Sleep disturbance    unspecified  . Stroke ([place]) 02/2017   "lots of mini strokes; big one 02/2017; that one made me weak in my knees,; never fully recovered" (03/18/2018)  . Syncope and collapse   . Thalassemia   . TIA (transient ischemic attack)    2000 and 2008 right carotid stent 08/14/2009 on Plavix  . TIA (transient ischemic attack)  03/18/2018  . Transient disorder of initiating or maintaining sleep   . Type II diabetes mellitus ([place])   . Varicose veins of bilateral lower extremities with other complications   . Varicose veins of bilateral lower extremities with other complications   . Vision abnormalities     Allergies Allergies  Allergen Reactions  . Latex Rash  . Meperidine     Other reaction(s): Other (See Comments) Other Reaction: CNS Disorder  . Penicillins Other (See Comments)    Has patient had a PCN reaction causing immediate rash, facial/tongue/throat swelling, SOB or lightheadedness with hypotension: Unknown Has patient had a PCN reaction causing severe rash involving mucus membranes or skin necrosis: Unknown Has patient had a PCN reaction that required hospitalization: Unknown Has patient had a PCN reaction occurring within the last 10 years: Unknown If all of the above answers are "NO", then may proceed with Cephalosporin use.   . Cefuroxime Axetil Nausea And Vomiting  . Codeine Nausea And Vomiting and Nausea Only  . Propoxyphene Nausea Only    Other reaction(s): Vomiting    IV Location/Drains/Wounds Patient Lines/Drains/Airways Status   Active Line/Drains/Airways    Name:   Placement date:   Placement time:   Site:   Days:   Peripheral IV 09/28/18 Left Antecubital   09/28/18    1102    Antecubital   less than 1   Peripheral IV 09/28/18 Right Arm   09/28/18    1125    Arm   less than 1   Peripheral IV 09/28/18 Right Wrist   09/28/18    1141    Wrist   less than 1   Incision (Closed) 11/08/17 Back   11/08/17    1202     324          Labs/Imaging Results for orders placed or performed during the hospital encounter of 09/28/18 (from the past 48 hour(s))  Ethanol     Status: None   Collection Time: 09/28/18 11:03 AM  Result Value Ref Range   Alcohol, Ethyl (B) <10 <10 mg/dL    Comment: (NOTE) Lowest detectable limit for serum alcohol is 10 mg/dL. For medical purposes only. Performed at  [facility], [address] [place], [place] [address]   Protime-INR     Status: None   Collection Time: 09/28/18 11:03 AM  Result Value Ref Range   Prothrombin Time 14.0 11.4 - 15.2 seconds   INR 1.09     Comment: Performed at [facility], [address] [place],  [address]  APTT     Status: None   Collection Time: 09/28/18  11:03 AM  Result Value Ref Range   aPTT 32 24 - 36 seconds    Comment: Performed at [facility], [address] [place], [place] [address]  CBC     Status: Abnormal   Collection Time: 09/28/18 11:03 AM  Result Value Ref Range   WBC 4.3 4.0 - 10.5 K/uL   RBC 5.57 (H) 3.87 - 5.11 MIL/uL   Hemoglobin 10.4 (L) 12.0 - 15.0 g/dL   HCT 34.9 (L) 36.0 - 46.0 %   MCV 62.7 (L) 80.0 - 100.0 fL   MCH 18.7 (L) 26.0 - 34.0 pg   MCHC 29.8 (L) 30.0 - 36.0 g/dL   RDW 16.9 (H) 11.5 - 15.5 %   Platelets 85 (L) 150 - 400 K/uL    Comment: Immature Platelet Fraction may be clinically indicated, consider ordering this additional test [id number]    nRBC 0.0 0.0 - 0.2 %    Comment: Performed at [facility], [address] [place], [place] [address]  Differential     Status: Abnormal   Collection Time: 09/28/18 11:03 AM  Result Value Ref Range   Neutrophils Relative % 23 %   Neutro Abs 1.0 (L) 1.7 - 7.7 K/uL   Lymphocytes Relative 56 %   Lymphs Abs 2.5 0.7 - 4.0 K/uL   Monocytes Relative 18 %   Monocytes Absolute 0.8 0.1 - 1.0 K/uL   Eosinophils Relative 1 %   Eosinophils Absolute 0.0 0.0 - 0.5 K/uL   Basophils Relative 1 %   Basophils Absolute 0.0 0.0 - 0.1 K/uL   Immature Granulocytes 1 %   Abs Immature Granulocytes 0.02 0.00 - 0.07 K/uL    Comment: Performed at [facility], [address] [place], [place] [address]  Comprehensive metabolic panel     Status: Abnormal   Collection Time: 09/28/18 11:03 AM  Result Value Ref Range   Sodium 139 135 - 145 mmol/L   Potassium 3.1 (L) 3.5 - 5.1 mmol/L   Chloride  105 98 - 111 mmol/L   CO2 28 22 - 32 mmol/L   Glucose, Bld 108 (H) 70 - 99 mg/dL   BUN 19 8 - 23 mg/dL   Creatinine, Ser 1.10 (H) 0.44 - 1.00 mg/dL   Calcium 9.6 8.9 - 10.3 mg/dL   Total Protein 7.2 6.5 - 8.1 g/dL   Albumin 4.2 3.5 - 5.0 g/dL   AST 20 15 - 41 U/L   ALT 13 0 - 44 U/L   Alkaline Phosphatase 26 (L) 38 - 126 U/L   Total Bilirubin 2.0 (H) 0.3 - 1.2 mg/dL   GFR calc non Af Amer 46 (L) >60 mL/min   GFR calc Af Amer 53 (L) >60 mL/min   Anion gap 6 5 - 15    Comment: Performed at [facility], [address] [place],  [address]  Troponin I - ONCE - STAT     Status: None   Collection Time: 09/28/18 11:03 AM  Result Value Ref Range   Troponin I <0.03 <0.03 ng/mL    Comment: Performed at [facility], [address] [place], [place] [address]  Glucose, capillary     Status: Abnormal   Collection Time: 09/28/18 11:25 AM  Result Value Ref Range   Glucose-Capillary 106 (H) 70 - 99 mg/dL   Ct Angio Head W Or Wo Contrast  Result Date: 09/28/2018 CLINICAL DATA:  Follow-up code stroke. Slurred speech. Mental status changes. EXAM: CT ANGIOGRAPHY HEAD AND NECK  TECHNIQUE: Multidetector CT imaging of the head and neck was performed using the standard protocol during bolus administration of intravenous contrast. Multiplanar CT image reconstructions and MIPs were obtained to evaluate the vascular anatomy. Carotid stenosis measurements (when applicable) are obtained utilizing NASCET criteria, using the distal internal carotid diameter as the denominator. CONTRAST:  77mL OMNIPAQUE IOHEXOL 350 MG/ML SOLN COMPARISON:  Head CT same day.  Multiple previous studies 2019. FINDINGS: CTA NECK FINDINGS Aortic arch: The aorta shows atherosclerotic change but no aneurysm or dissection. Branching pattern is normal. Right carotid system: Common carotid artery is tortuous but widely patent to the bifurcation. There is a carotid stent from the distal common carotid artery into the  internal carotid artery. The stent appears patent without evidence of significant stenosis. There may be mild intimal thickening in the midportion. Beyond the stent, the internal carotid artery is widely patent to the skull base. Left carotid system: Common carotid artery is tortuous but widely patent to the bifurcation. There is calcified plaque at the carotid bifurcation and ICA bulb. Minimal diameter in the ICA bulb measures 3.5 mm. Compared to a more distal cervical ICA diameter of 4 mm, this represents only a 10-20% stenosis. Cervical ICA widely patent beyond that. Vertebral arteries: Calcified plaque at the left vertebral artery origin with stenosis of 50%. Beyond the origin, the vessel is widely patent to the foramen magnum. Right vertebral artery origin is widely patent. There is focal calcified plaque just distal to the origin with stenosis estimated at 75%. Beyond that, the vessel is widely patent through the cervical region to the foramen magnum. Skeleton: Ordinary spondylosis. Other neck: No mass or lymphadenopathy. Upper chest: Mild pulmonary scarring and dependent atelectasis. Review of the MIP images confirms the above findings CTA HEAD FINDINGS Anterior circulation: Both internal carotid arteries are widely patent through the skull base and siphon regions. There is ordinary siphon atherosclerotic calcification but no stenosis greater than 20%. The anterior and middle cerebral vessels are patent without large or medium vessel occlusion. No correctable proximal stenosis. Posterior circulation: Both vertebral arteries are patent through the foramen magnum to the basilar. No basilar stenosis. Posterior circulation branch vessels appear patent. There is mild distal vessel atherosclerotic irregularity. Venous sinuses: Patent and normal. Anatomic variants: None significant. Delayed phase: No abnormal enhancement. Review of the MIP images confirms the above findings IMPRESSION: Right carotid stent is patent  without significant stenosis. There is probably mild intimal thickening in the midportion of the stent. Atherosclerotic disease at the left carotid bifurcation. Narrowing in the ICA bulb is only on the order of 10-20% when compared to the diameter of the more distal cervical ICA. 50% stenosis of the left vertebral artery origin, with wide patency beyond that. 75% stenosis just distal to the right vertebral artery origin with wide patency beyond that. No intracranial large or medium vessel occlusion or correctable proximal stenosis. Electronically Signed   By: Nelson Chimes M.D.   On: 09/28/2018 12:38   Ct Angio Neck W And/or Wo Contrast  Result Date: 09/28/2018 CLINICAL DATA:  Follow-up code stroke. Slurred speech. Mental status changes. EXAM: CT ANGIOGRAPHY HEAD AND NECK TECHNIQUE: Multidetector CT imaging of the head and neck was performed using the standard protocol during bolus administration of intravenous contrast. Multiplanar CT image reconstructions and MIPs were obtained to evaluate the vascular anatomy. Carotid stenosis measurements (when applicable) are obtained utilizing NASCET criteria, using the distal internal carotid diameter as the denominator. CONTRAST:  66mL OMNIPAQUE IOHEXOL 350 MG/ML SOLN COMPARISON:  Head CT same day.  Multiple previous studies 2019. FINDINGS: CTA NECK FINDINGS Aortic arch: The aorta shows atherosclerotic change but no aneurysm or dissection. Branching pattern is normal. Right carotid system: Common carotid artery is tortuous but widely patent to the bifurcation. There is a carotid stent from the distal common carotid artery into the internal carotid artery. The stent appears patent without evidence of significant stenosis. There may be mild intimal thickening in the midportion. Beyond the stent, the internal carotid artery is widely patent to the skull base. Left carotid system: Common carotid artery is tortuous but widely patent to the bifurcation. There is calcified plaque  at the carotid bifurcation and ICA bulb. Minimal diameter in the ICA bulb measures 3.5 mm. Compared to a more distal cervical ICA diameter of 4 mm, this represents only a 10-20% stenosis. Cervical ICA widely patent beyond that. Vertebral arteries: Calcified plaque at the left vertebral artery origin with stenosis of 50%. Beyond the origin, the vessel is widely patent to the foramen magnum. Right vertebral artery origin is widely patent. There is focal calcified plaque just distal to the origin with stenosis estimated at 75%. Beyond that, the vessel is widely patent through the cervical region to the foramen magnum. Skeleton: Ordinary spondylosis. Other neck: No mass or lymphadenopathy. Upper chest: Mild pulmonary scarring and dependent atelectasis. Review of the MIP images confirms the above findings CTA HEAD FINDINGS Anterior circulation: Both internal carotid arteries are widely patent through the skull base and siphon regions. There is ordinary siphon atherosclerotic calcification but no stenosis greater than 20%. The anterior and middle cerebral vessels are patent without large or medium vessel occlusion. No correctable proximal stenosis. Posterior circulation: Both vertebral arteries are patent through the foramen magnum to the basilar. No basilar stenosis. Posterior circulation branch vessels appear patent. There is mild distal vessel atherosclerotic irregularity. Venous sinuses: Patent and normal. Anatomic variants: None significant. Delayed phase: No abnormal enhancement. Review of the MIP images confirms the above findings IMPRESSION: Right carotid stent is patent without significant stenosis. There is probably mild intimal thickening in the midportion of the stent. Atherosclerotic disease at the left carotid bifurcation. Narrowing in the ICA bulb is only on the order of 10-20% when compared to the diameter of the more distal cervical ICA. 50% stenosis of the left vertebral artery origin, with wide patency  beyond that. 75% stenosis just distal to the right vertebral artery origin with wide patency beyond that. No intracranial large or medium vessel occlusion or correctable proximal stenosis. Electronically Signed   By: Nelson Chimes M.D.   On: 09/28/2018 12:38   Ct Head Code Stroke Wo Contrast  Result Date: 09/28/2018 CLINICAL DATA:  Code stroke.  Slurred speech, aphasia EXAM: CT HEAD WITHOUT CONTRAST TECHNIQUE: Contiguous axial images were obtained from the base of the skull through the vertex without intravenous contrast. COMPARISON:  CT head 06/04/2018 FINDINGS: Brain: Moderate atrophy. Extensive chronic microvascular ischemic change throughout the white matter. Chronic infarct right frontal lobe. Small chronic infarcts cerebellum bilaterally unchanged. Negative for acute infarct. Negative for hemorrhage or mass. No midline shift. Vascular: Negative for hyperdense vessel. Atherosclerotic disease in the cavernous carotid bilaterally. Skull: Negative Sinuses/Orbits: Negative Other: None ASPECTS ([place] Stroke Program Early CT Score) - Ganglionic level infarction (caudate, lentiform nuclei, internal capsule, insula, M1-M3 cortex): 7 - Supraganglionic infarction (M4-M6 cortex): 3 Total score (0-10 with 10 being normal): 10 IMPRESSION: 1. No acute intracranial abnormality. 2. Atrophy and extensive chronic ischemic change. 3. ASPECTS is 10 4.  These results were called by telephone at the time of interpretation on 09/28/2018 at 11:29 am to Dr. Lenise Arena , who verbally acknowledged these results. Electronically Signed   By: Franchot Gallo M.D.   On: 09/28/2018 11:30    Pending Labs Unresulted Labs (From admission, onward)    Start     Ordered   09/29/18 0500  TSH  Tomorrow morning,   STAT     09/28/18 1538   09/29/18 0500  Hemoglobin A1c  Tomorrow morning,   STAT     09/28/18 1538   09/28/18 1053  Urine Drug Screen, Qualitative  Once,   STAT     09/28/18 1053   09/28/18 1053  Urinalysis, Routine w  reflex microscopic  ONCE - STAT,   STAT     09/28/18 1053   Signed and Held  Lipid panel  Tomorrow morning,   R    Comments:  Fasting    Signed and Held   Signed and Held  CBC  (enoxaparin (LOVENOX)    CrCl >/= 30 ml/min)  Once,   R    Comments:  Baseline for enoxaparin therapy IF NOT ALREADY DRAWN.  Notify MD if PLT < 100 K.    Signed and Held   Signed and Held  Creatinine, serum  (enoxaparin (LOVENOX)    CrCl >/= 30 ml/min)  Once,   R    Comments:  Baseline for enoxaparin therapy IF NOT ALREADY DRAWN.    Signed and Held   Signed and Held  Creatinine, serum  (enoxaparin (LOVENOX)    CrCl >/= 30 ml/min)  Weekly,   R    Comments:  while on enoxaparin therapy    Signed and Held          Vitals/Pain Today's Vitals   09/28/18 1445 09/28/18 1500 09/28/18 1515 09/28/18 1545  BP: (!) 183/83 (!) 170/82 (!) 172/82 (!) 187/79  Pulse:  78 80 82  Resp: 13 18 (!) 21 (!) 22  Temp:      TempSrc:      SpO2:  96% 98% 96%  Weight:      PainSc:        Isolation Precautions No active isolations  Medications Medications  clevidipine (CLEVIPREX) infusion 0.5 mg/mL (0 mg/hr Intravenous Stopped 09/28/18 1143)  potassium chloride SA (K-DUR,KLOR-CON) CR tablet 40 mEq (has no administration in time range)  LORazepam (ATIVAN) injection 0.5 mg (0.5 mg Intravenous Given 09/28/18 1155)  iohexol (OMNIPAQUE) 350 MG/ML injection 75 mL (75 mLs Intravenous Contrast Given 09/28/18 1209)  aspirin chewable tablet 324 mg (324 mg Oral Given 09/28/18 1450)    Mobility walks with person assist

## 2018-09-28 NOTE — ED Notes (Signed)
Patient transported to CT 

## 2018-09-28 NOTE — ED Notes (Signed)
Pt back from CT

## 2018-09-28 NOTE — H&P (Signed)
[place] at [facility]   PATIENT NAME: Stephanie Harris    MR#:  [id number]  DATE OF BIRTH:  01-23-1931  DATE OF ADMISSION:  09/28/2018  PRIMARY CARE PHYSICIAN: Stephanie Guise, MD   REQUESTING/REFERRING PHYSICIAN: Dr. Jimmye Harris  CHIEF COMPLAINT:  Difficulty speaking  HISTORY OF PRESENT ILLNESS:  Stephanie Harris  is a 85 y.o. female with a known history of chronic kidney disease, coronary artery disease, CML, thrombocytopenia, bilateral carotid artery disease status post right carotid stent, hypothyroidism, chronic kidney disease is brought into the hospital as she was having difficulty speaking since this morning.  Last well-known was 8 AM today at 9:45 PM home health nurse has noticed slurry speech and 911 was called by the time EMS arrived symptoms were resolved but by the time patient came into the ED at around 10:50 AM she was still having expressive aphasia. In the emergency department patient systolic blood pressure was over 200 and neurology has assessed the patient in view of systolic blood pressure being greater than 200 and platelet count being at 85,000 IV TPA was contraindicated.  Patient was started on Cleviprex drip for aggressive management of the systolic blood pressure.  CT scan of the brain did not reveal any acute abnormalities.  Initial NIH stroke scale was at 9 and eventually during my examination it was down to 3.  No family was at bedside.  Patient speech was getting better during my examination.  PAST MEDICAL HISTORY:   Past Medical History:  Diagnosis Date  . Acute bronchiolitis   . Acute pharyngitis   . Allergic rhinitis due to pollen   . Anemia, unspecified    CKD and LGL  . Anxiety state    unspecified  . Arthritis    "knees, legs" (03/18/2018)  . Asthma without status asthmaticus    unspecified  . B12 deficiency   . Beta thalassemia trait   . Carotid artery occlusion   . Cellulitis and abscess of leg, except foot   .  Cervical spondylosis   . Cervicalgia   . Chronic kidney disease   . Chronic lower back pain   . CML (chronic myelocytic leukemia) ([place])    Onc at [facility]  . Complication of anesthesia    "last back surgery they liked to never get me awake" (03/18/2018)  . Coronary artery disease 2009   heart attack with stent  . Coronary atherosclerosis of native coronary artery   . Depressive disorder    not elsewhere classified  . Difficulty in walking   . Disorder of breast, unspecified   . Dizziness and giddiness   . Dysuria   . Esophageal reflux   . Essential hypertension    unspecified  . Family history of adverse reaction to anesthesia    "liked to never get my sister awake" (03/18/2018)  . Head injury    unspecified  . Headache    "q time I have a stroke" (03/18/2018)  . Heart disease   . Hematuria, unspecified   . History of blood transfusion    "had 22 when they found out I had leukemia" (03/18/2018)  . Hypersomnia, unspecified   . Hypertension   . Hypopotassemia   . Hypothyroidism   . ICH (intracerebral hemorrhage) ([place])   . Ill-defined cerebrovascular disease    other  . Insomnia    unspecified  . 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 Leukemia ([place])   . Lower urinary tract infection   .  Lumbago   . Mini stroke ([place])    x years  . Mixed hyperlipidemia   . Myocardial infarction ([place]) ~2017  . Nontoxic nodular goiter    unspecified  . Occlusion and stenosis of unspecified carotid artery    without mention of cerebral infarction  . Osteoarthritis   . Osteoporosis   . Other constipation   . Other vitamin B12 deficiency anemias   . Otitis media, unspecified, unspecified ear   . Ovarian failure    unspecified  . Pain in limb   . Panic disorder without agoraphobia   . Peripheral vascular disease ([place])    unspecified  . Phlebitis of left arm   . RA (rheumatoid arthritis) ([place])   . Sigmoid polyp 1998  . Sleep disturbance    unspecified  . Stroke ([place])  02/2017   "lots of mini strokes; big one 02/2017; that one made me weak in my knees,; never fully recovered" (03/18/2018)  . Syncope and collapse   . Thalassemia   . TIA (transient ischemic attack)    2000 and 2008 right carotid stent 08/14/2009 on Plavix  . TIA (transient ischemic attack) 03/18/2018  . Transient disorder of initiating or maintaining sleep   . Type II diabetes mellitus ([place])   . Varicose veins of bilateral lower extremities with other complications   . Varicose veins of bilateral lower extremities with other complications   . Vision abnormalities     PAST SURGICAL HISTOIRY:   Past Surgical History:  Procedure Laterality Date  . BACK SURGERY    . CAROTID ENDARTERECTOMY Left   . CAROTID STENT INSERTION Right    "have 2 stents in there" (03/18/2018)  . CATARACT EXTRACTION W/ INTRAOCULAR LENS  IMPLANT, BILATERAL Bilateral 06/16/2001 - 05/28/2003   +23.5D     22.5D  . CHOLECYSTECTOMY OPEN  1980  . COLONOSCOPY  2010  . CORONARY ANGIOPLASTY WITH STENT PLACEMENT    . EYELID SURGERY Bilateral 2005   BUL BLEPH  . FRACTURE SURGERY    . JOINT REPLACEMENT    . KYPHOPLASTY N/A 08/12/2017   Procedure: KYPHOPLASTY;  Surgeon: Hessie Knows, MD;  Location: [facility] ORS;  Service: Orthopedics;  Laterality: N/A;  . KYPHOPLASTY N/A 11/08/2017   Procedure: Hewitt Shorts;  Surgeon: Hessie Knows, MD;  Location: [facility] ORS;  Service: Orthopedics;  Laterality: N/A;  . PERCUTANEOUS PLACEMENT INTRAVASCULAR STENT CERVICAL CAROTID ARTERY  06/14/2009  . ROTATOR CUFF REPAIR Left   . TOTAL ABDOMINAL HYSTERECTOMY  1978   WITH REMOVAL TUBES & /OR OVARIES  . TOTAL KNEE ARTHROPLASTY Right     SOCIAL HISTORY:   Social History   Tobacco Use  . Smoking status: Never Smoker  . Smokeless tobacco: Never Used  Substance Use Topics  . Alcohol use: No    FAMILY HISTORY:   Family History  Problem Relation Age of Onset  . Acute myelogenous leukemia Brother   . Anemia Brother   . Coronary artery  disease Brother   . Heart disease Brother   . Stroke Brother   . Heart failure Brother   . Stroke Mother   . Anemia Mother   . Heart disease Mother   . Heart failure Mother   . Hypertension Mother   . Osteoarthritis Mother   . Rheum arthritis Mother   . Heart attack Father   . Anemia Sister   . Cataracts Sister   . Osteoarthritis Sister   . Rheum arthritis Sister   . Stroke Sister   . Heart disease Sister   .  Thalassemia Sister   . Thalassemia Sister   . Blindness Neg Hx   . Glaucoma Neg Hx   . Macular degeneration Neg Hx   . Strabismus Neg Hx   . Vision loss Neg Hx   . Basal cell carcinoma Neg Hx   . GU problems Neg Hx   . Kidney cancer Neg Hx   . Melanoma Neg Hx   . Kidney disease Neg Hx   . Prostate cancer Neg Hx   . Squamous cell carcinoma Neg Hx     DRUG ALLERGIES:   Allergies  Allergen Reactions  . Latex Rash  . Meperidine     Other reaction(s): Other (See Comments) Other Reaction: CNS Disorder  . Penicillins Other (See Comments)    Has patient had a PCN reaction causing immediate rash, facial/tongue/throat swelling, SOB or lightheadedness with hypotension: Unknown Has patient had a PCN reaction causing severe rash involving mucus membranes or skin necrosis: Unknown Has patient had a PCN reaction that required hospitalization: Unknown Has patient had a PCN reaction occurring within the last 10 years: Unknown If all of the above answers are "NO", then may proceed with Cephalosporin use.   . Cefuroxime Axetil Nausea And Vomiting  . Codeine Nausea And Vomiting and Nausea Only  . Propoxyphene Nausea Only    Other reaction(s): Vomiting    REVIEW OF SYSTEMS:  CONSTITUTIONAL: No fever, fatigue or weakness.  EYES: No blurred or double vision.  EARS, NOSE, AND THROAT: No tinnitus or ear pain.  RESPIRATORY: No cough, shortness of breath, wheezing or hemoptysis.  CARDIOVASCULAR: No chest pain, orthopnea, edema.  GASTROINTESTINAL: No nausea, vomiting, diarrhea  or abdominal pain.  GENITOURINARY: No dysuria, hematuria.  ENDOCRINE: No polyuria, nocturia,  HEMATOLOGY: No anemia, easy bruising or bleeding SKIN: No rash or lesion. MUSCULOSKELETAL: No joint pain or arthritis.   NEUROLOGIC: Global weakness of all extremities no tingling, numbness, endorsing slurry speech which is getting better PSYCHIATRY: No anxiety or depression.   MEDICATIONS AT HOME:   Prior to Admission medications   Medication Sig Start Date End Date Taking? Authorizing Provider  acetaminophen (TYLENOL) 500 MG tablet Take 1,000 mg by mouth every 8 (eight) hours. 07/07/18  Yes [provider]  atorvastatin (LIPITOR) 20 MG tablet Take 1 tablet (20 mg total) by mouth daily at 6 PM. 07/15/18  Yes Green, Phylis Bougie, NP  levothyroxine (SYNTHROID, LEVOTHROID) 50 MCG tablet Take 1 tablet (50 mcg total) by mouth daily. 08/30/18  Yes Ronnell Freshwater, NP  linaclotide (LINZESS) 145 MCG CAPS capsule Take 1 capsule (145 mcg total) by mouth daily before breakfast. 08/30/18  Yes Boscia, Heather E, NP  ondansetron (ZOFRAN ODT) 4 MG disintegrating tablet Take 1 tablet (4 mg total) by mouth every 8 (eight) hours as needed for nausea or vomiting. 09/16/18  Yes Boscia, Heather E, NP  oxyCODONE (OXY IR/ROXICODONE) 5 MG immediate release tablet Take 2.5-5 mg by mouth every 6 (six) hours as needed for moderate pain or severe pain.   Yes [provider]      VITAL SIGNS:  Blood pressure (!) 172/82, pulse 80, temperature 97.6 F (36.4 C), temperature source Oral, resp. rate (!) 21, weight 52.9 kg, SpO2 98 %.  PHYSICAL EXAMINATION:  GENERAL:  85 y.o.-year-old patient lying in the bed with no acute distress.  EYES: Pupils equal, round, reactive to light and accommodation. No scleral icterus. Extraocular muscles intact.  HEENT: Head atraumatic, normocephalic. Oropharynx and nasopharynx clear.  NECK:  Supple, no jugular venous distention.  No thyroid enlargement, no tenderness.  LUNGS: Normal  breath sounds bilaterally, no wheezing, rales,rhonchi or crepitation. No use of accessory muscles of respiration.  CARDIOVASCULAR: S1, S2 normal. No murmurs, rubs, or gallops.  ABDOMEN: Soft, nontender, nondistended. Bowel sounds present. No organomegaly or mass.  EXTREMITIES: No pedal edema, cyanosis, or clubbing.  NEUROLOGIC: Awake, alert and oriented x3. Muscle strength 2/5 in all extremities. Sensation intact. Gait not checked.  Improving expressive aphasia PSYCHIATRIC: The patient is alert and oriented x 3.  SKIN: No obvious rash, lesion, or ulcer.   LABORATORY PANEL:   CBC Recent Labs  Lab 09/28/18 1103  WBC 4.3  HGB 10.4*  HCT 34.9*  PLT 85*   ------------------------------------------------------------------------------------------------------------------  Chemistries  Recent Labs  Lab 09/28/18 1103  NA 139  K 3.1*  CL 105  CO2 28  GLUCOSE 108*  BUN 19  CREATININE 1.10*  CALCIUM 9.6  AST 20  ALT 13  ALKPHOS 26*  BILITOT 2.0*   ------------------------------------------------------------------------------------------------------------------  Cardiac Enzymes Recent Labs  Lab 09/28/18 1103  TROPONINI <0.03   ------------------------------------------------------------------------------------------------------------------  RADIOLOGY:  Ct Angio Head W Or Wo Contrast  Result Date: 09/28/2018 CLINICAL DATA:  Follow-up code stroke. Slurred speech. Mental status changes. EXAM: CT ANGIOGRAPHY HEAD AND NECK TECHNIQUE: Multidetector CT imaging of the head and neck was performed using the standard protocol during bolus administration of intravenous contrast. Multiplanar CT image reconstructions and MIPs were obtained to evaluate the vascular anatomy. Carotid stenosis measurements (when applicable) are obtained utilizing NASCET criteria, using the distal internal carotid diameter as the denominator. CONTRAST:  37mL OMNIPAQUE IOHEXOL 350 MG/ML SOLN COMPARISON:  Head CT same  day.  Multiple previous studies 2019. FINDINGS: CTA NECK FINDINGS Aortic arch: The aorta shows atherosclerotic change but no aneurysm or dissection. Branching pattern is normal. Right carotid system: Common carotid artery is tortuous but widely patent to the bifurcation. There is a carotid stent from the distal common carotid artery into the internal carotid artery. The stent appears patent without evidence of significant stenosis. There may be mild intimal thickening in the midportion. Beyond the stent, the internal carotid artery is widely patent to the skull base. Left carotid system: Common carotid artery is tortuous but widely patent to the bifurcation. There is calcified plaque at the carotid bifurcation and ICA bulb. Minimal diameter in the ICA bulb measures 3.5 mm. Compared to a more distal cervical ICA diameter of 4 mm, this represents only a 10-20% stenosis. Cervical ICA widely patent beyond that. Vertebral arteries: Calcified plaque at the left vertebral artery origin with stenosis of 50%. Beyond the origin, the vessel is widely patent to the foramen magnum. Right vertebral artery origin is widely patent. There is focal calcified plaque just distal to the origin with stenosis estimated at 75%. Beyond that, the vessel is widely patent through the cervical region to the foramen magnum. Skeleton: Ordinary spondylosis. Other neck: No mass or lymphadenopathy. Upper chest: Mild pulmonary scarring and dependent atelectasis. Review of the MIP images confirms the above findings CTA HEAD FINDINGS Anterior circulation: Both internal carotid arteries are widely patent through the skull base and siphon regions. There is ordinary siphon atherosclerotic calcification but no stenosis greater than 20%. The anterior and middle cerebral vessels are patent without large or medium vessel occlusion. No correctable proximal stenosis. Posterior circulation: Both vertebral arteries are patent through the foramen magnum to the  basilar. No basilar stenosis. Posterior circulation branch vessels appear patent. There is mild distal vessel atherosclerotic irregularity. Venous sinuses: Patent and  normal. Anatomic variants: None significant. Delayed phase: No abnormal enhancement. Review of the MIP images confirms the above findings IMPRESSION: Right carotid stent is patent without significant stenosis. There is probably mild intimal thickening in the midportion of the stent. Atherosclerotic disease at the left carotid bifurcation. Narrowing in the ICA bulb is only on the order of 10-20% when compared to the diameter of the more distal cervical ICA. 50% stenosis of the left vertebral artery origin, with wide patency beyond that. 75% stenosis just distal to the right vertebral artery origin with wide patency beyond that. No intracranial large or medium vessel occlusion or correctable proximal stenosis. Electronically Signed   By: Nelson Chimes M.D.   On: 09/28/2018 12:38   Ct Angio Neck W And/or Wo Contrast  Result Date: 09/28/2018 CLINICAL DATA:  Follow-up code stroke. Slurred speech. Mental status changes. EXAM: CT ANGIOGRAPHY HEAD AND NECK TECHNIQUE: Multidetector CT imaging of the head and neck was performed using the standard protocol during bolus administration of intravenous contrast. Multiplanar CT image reconstructions and MIPs were obtained to evaluate the vascular anatomy. Carotid stenosis measurements (when applicable) are obtained utilizing NASCET criteria, using the distal internal carotid diameter as the denominator. CONTRAST:  14mL OMNIPAQUE IOHEXOL 350 MG/ML SOLN COMPARISON:  Head CT same day.  Multiple previous studies 2019. FINDINGS: CTA NECK FINDINGS Aortic arch: The aorta shows atherosclerotic change but no aneurysm or dissection. Branching pattern is normal. Right carotid system: Common carotid artery is tortuous but widely patent to the bifurcation. There is a carotid stent from the distal common carotid artery into the  internal carotid artery. The stent appears patent without evidence of significant stenosis. There may be mild intimal thickening in the midportion. Beyond the stent, the internal carotid artery is widely patent to the skull base. Left carotid system: Common carotid artery is tortuous but widely patent to the bifurcation. There is calcified plaque at the carotid bifurcation and ICA bulb. Minimal diameter in the ICA bulb measures 3.5 mm. Compared to a more distal cervical ICA diameter of 4 mm, this represents only a 10-20% stenosis. Cervical ICA widely patent beyond that. Vertebral arteries: Calcified plaque at the left vertebral artery origin with stenosis of 50%. Beyond the origin, the vessel is widely patent to the foramen magnum. Right vertebral artery origin is widely patent. There is focal calcified plaque just distal to the origin with stenosis estimated at 75%. Beyond that, the vessel is widely patent through the cervical region to the foramen magnum. Skeleton: Ordinary spondylosis. Other neck: No mass or lymphadenopathy. Upper chest: Mild pulmonary scarring and dependent atelectasis. Review of the MIP images confirms the above findings CTA HEAD FINDINGS Anterior circulation: Both internal carotid arteries are widely patent through the skull base and siphon regions. There is ordinary siphon atherosclerotic calcification but no stenosis greater than 20%. The anterior and middle cerebral vessels are patent without large or medium vessel occlusion. No correctable proximal stenosis. Posterior circulation: Both vertebral arteries are patent through the foramen magnum to the basilar. No basilar stenosis. Posterior circulation branch vessels appear patent. There is mild distal vessel atherosclerotic irregularity. Venous sinuses: Patent and normal. Anatomic variants: None significant. Delayed phase: No abnormal enhancement. Review of the MIP images confirms the above findings IMPRESSION: Right carotid stent is patent  without significant stenosis. There is probably mild intimal thickening in the midportion of the stent. Atherosclerotic disease at the left carotid bifurcation. Narrowing in the ICA bulb is only on the order of 10-20% when compared to  the diameter of the more distal cervical ICA. 50% stenosis of the left vertebral artery origin, with wide patency beyond that. 75% stenosis just distal to the right vertebral artery origin with wide patency beyond that. No intracranial large or medium vessel occlusion or correctable proximal stenosis. Electronically Signed   By: Nelson Chimes M.D.   On: 09/28/2018 12:38   Ct Head Code Stroke Wo Contrast  Result Date: 09/28/2018 CLINICAL DATA:  Code stroke.  Slurred speech, aphasia EXAM: CT HEAD WITHOUT CONTRAST TECHNIQUE: Contiguous axial images were obtained from the base of the skull through the vertex without intravenous contrast. COMPARISON:  CT head 06/04/2018 FINDINGS: Brain: Moderate atrophy. Extensive chronic microvascular ischemic change throughout the white matter. Chronic infarct right frontal lobe. Small chronic infarcts cerebellum bilaterally unchanged. Negative for acute infarct. Negative for hemorrhage or mass. No midline shift. Vascular: Negative for hyperdense vessel. Atherosclerotic disease in the cavernous carotid bilaterally. Skull: Negative Sinuses/Orbits: Negative Other: None ASPECTS ([place] Stroke Program Early CT Score) - Ganglionic level infarction (caudate, lentiform nuclei, internal capsule, insula, M1-M3 cortex): 7 - Supraganglionic infarction (M4-M6 cortex): 3 Total score (0-10 with 10 being normal): 10 IMPRESSION: 1. No acute intracranial abnormality. 2. Atrophy and extensive chronic ischemic change. 3. ASPECTS is 10 4. These results were called by telephone at the time of interpretation on 09/28/2018 at 11:29 am to Dr. Lenise Arena , who verbally acknowledged these results. Electronically Signed   By: Franchot Gallo M.D.   On: 09/28/2018 11:30     EKG:   Orders placed or performed during the hospital encounter of 09/28/18  . ED EKG  . ED EKG  . EKG 12-Lead  . EKG 12-Lead    IMPRESSION AND PLAN:  #Acute left hemispheric ischemia Admit to MedSurg unit CT angiogram of the head and neck revealed no large vessel occlusion We will get stroke work-up with MRI of the brain, echocardiogram Check fasting lipid panel, TSH, hemoglobin A1c Management of the uncontrolled hypertension Patient has passed bedside swallow evaluation PT, OT and speech therapy evaluation LDL goal less than 70 start patient on Lipitor 80 mg at bedtime Aspirin once daily Neurology consult placed and patient was already seen by neurology Patient is not a TPA candidate in view of thrombocytopenia and elevated systolic blood pressure greater than 200   #Hypertensive urgency Will allow permissive hypertension but for uncontrolled blood pressure will provide IV Lopressor as needed-for systolic blood pressure greater than 170  #Hypothyroidism continue Synthyroid  #Hypokalemia replete and recheck in a.m.  #H/o CML-follow-up with primary care physician/oncology for surveillance  #History of bilateral carotid artery disease status post right carotid stent  #Chronic hyperlipidemia continue statin and check lipid panel in a.m.  All the records are reviewed and case discussed with ED provider. Management plans discussed with the patient, she is in agreement  CODE STATUS: fc, husband is the healthcare POA  TOTAL TIME TAKING CARE OF THIS PATIENT: 43 minutes.   Note: This dictation was prepared with Dragon dictation along with smaller phrase technology. Any transcriptional errors that result from this process are unintentional.  [name] M.D on 09/28/2018 at 3:24 PM  Between 7am to 6pm - Pager - [phone]  After 6pm go to [email or web address] - password [email or web address]  [facility]  Office  [phone]  CC: Primary care physician; Stephanie Guise, MD

## 2018-09-28 NOTE — Progress Notes (Signed)
PHARMACIST - PHYSICIAN COMMUNICATION  CONCERNING:  Enoxaparin (Lovenox) for DVT Prophylaxis    RECOMMENDATION: Patient was prescribed enoxaprin 40mg  q24 hours for VTE prophylaxis.   Filed Weights   09/28/18 1131  Weight: 116 lb 10 oz (52.9 kg)    Body mass index is 22.78 kg/m.  Estimated Creatinine Clearance: 26.9 mL/min (A) (by C-G formula based on SCr of 1.1 mg/dL (H)).   Patient is candidate for enoxaparin 30mg  every 24 hours based on CrCl <65ml/min.   DESCRIPTION: Pharmacy has adjusted enoxaparin dose per [facility] policy.  Patient is now receiving enoxaparin 30mg  every 24 hours. Patient received TPA 2/5. Enoxaparin start time has been pushed out to 2/6 @ 2200.    Pernell Dupre, PharmD, BCPS Clinical Pharmacist 09/28/2018 5:25 PM

## 2018-09-28 NOTE — ED Notes (Signed)
1st attempt to call report. Told they were busy and to call back later.

## 2018-09-28 NOTE — Progress Notes (Signed)
CODE STROKE- PHARMACY COMMUNICATION   Time CODE STROKE called/page received:1113  Time response to CODE STROKE was made (in person or via phone): 1120  Time Stroke Kit retrieved from [place] (only if needed): 1125; decided not to use after mixed due to platelets.   Name of Provider/Nurse contacted:Olivia   Past Medical History:  Diagnosis Date  . Acute bronchiolitis   . Acute pharyngitis   . Allergic rhinitis due to pollen   . Anemia, unspecified    CKD and LGL  . Anxiety state    unspecified  . Arthritis    "knees, legs" (03/18/2018)  . Asthma without status asthmaticus    unspecified  . B12 deficiency   . Beta thalassemia trait   . Carotid artery occlusion   . Cellulitis and abscess of leg, except foot   . Cervical spondylosis   . Cervicalgia   . Chronic kidney disease   . Chronic lower back pain   . CML (chronic myelocytic leukemia) ([place])    Onc at [facility]  . Complication of anesthesia    "last back surgery they liked to never get me awake" (03/18/2018)  . Coronary artery disease 2009   heart attack with stent  . Coronary atherosclerosis of native coronary artery   . Depressive disorder    not elsewhere classified  . Difficulty in walking   . Disorder of breast, unspecified   . Dizziness and giddiness   . Dysuria   . Esophageal reflux   . Essential hypertension    unspecified  . Family history of adverse reaction to anesthesia    "liked to never get my sister awake" (03/18/2018)  . Head injury    unspecified  . Headache    "q time I have a stroke" (03/18/2018)  . Heart disease   . Hematuria, unspecified   . History of blood transfusion    "had 22 when they found out I had leukemia" (03/18/2018)  . Hypersomnia, unspecified   . Hypertension   . Hypopotassemia   . Hypothyroidism   . ICH (intracerebral hemorrhage) ([place])   . Ill-defined cerebrovascular disease    other  . Insomnia    unspecified  . 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 Leukemia  ([place])   . Lower urinary tract infection   . Lumbago   . Mini stroke ([place])    x years  . Mixed hyperlipidemia   . Myocardial infarction ([place]) ~2017  . Nontoxic nodular goiter    unspecified  . Occlusion and stenosis of unspecified carotid artery    without mention of cerebral infarction  . Osteoarthritis   . Osteoporosis   . Other constipation   . Other vitamin B12 deficiency anemias   . Otitis media, unspecified, unspecified ear   . Ovarian failure    unspecified  . Pain in limb   . Panic disorder without agoraphobia   . Peripheral vascular disease ([place])    unspecified  . Phlebitis of left arm   . RA (rheumatoid arthritis) ([place])   . Sigmoid polyp 1998  . Sleep disturbance    unspecified  . Stroke ([place]) 02/2017   "lots of mini strokes; big one 02/2017; that one made me weak in my knees,; never fully recovered" (03/18/2018)  . Syncope and collapse   . Thalassemia   . TIA (transient ischemic attack)    2000 and 2008 right carotid stent 08/14/2009 on Plavix  . TIA (transient ischemic attack) 03/18/2018  . Transient disorder of initiating or maintaining  sleep   . Type II diabetes mellitus ([place])   . Varicose veins of bilateral lower extremities with other complications   . Varicose veins of bilateral lower extremities with other complications   . Vision abnormalities    Prior to Admission medications   Medication Sig Start Date End Date Taking? Authorizing Provider  acetaminophen (TYLENOL) 500 MG tablet Take 1,000 mg by mouth every 8 (eight) hours. 07/07/18   [provider]  ascorbic acid (VITAMIN C) 1000 MG tablet Take 1,000 mg by mouth daily.    [provider]  aspirin 325 MG tablet Take 325 mg by mouth daily.  07/06/18   [provider]  atorvastatin (LIPITOR) 20 MG tablet Take 1 tablet (20 mg total) by mouth daily at 6 PM. Patient not taking: Reported on 08/30/2018 07/15/18   Gerlene Fee, NP  carvedilol (COREG) 3.125 MG tablet Take 1 tablet  (3.125 mg total) by mouth 2 (two) times daily with a meal. 07/15/18   Gerlene Fee, NP  cholecalciferol (VITAMIN D) 1000 UNITS tablet Take 4,000 Units by mouth daily.  07/07/18   [provider]  cyanocobalamin (,VITAMIN B-12,) 1000 MCG/ML injection Inject 1,000 mcg into the muscle every 30 (thirty) days. At the 1st of the month - [place]    [provider]  folic acid (FOLVITE) 162 MCG tablet Take 400 mcg by mouth daily.    [provider]  levothyroxine (SYNTHROID, LEVOTHROID) 50 MCG tablet Take 1 tablet (50 mcg total) by mouth daily. 08/30/18   Ronnell Freshwater, NP  linaclotide (LINZESS) 145 MCG CAPS capsule Take 1 capsule (145 mcg total) by mouth daily before breakfast. 08/30/18   Ronnell Freshwater, NP  Melatonin 3 MG TABS Take 1 tablet by mouth at bedtime. 07/06/18   [provider]  NON FORMULARY Diet type:  Regular    [provider]  ondansetron (ZOFRAN ODT) 4 MG disintegrating tablet Take 1 tablet (4 mg total) by mouth every 8 (eight) hours as needed for nausea or vomiting. 09/16/18   Ronnell Freshwater, NP  polyethylene glycol (MIRALAX / GLYCOLAX) packet Take 17 g by mouth daily.  07/06/18   [provider]  sennosides-docusate sodium (SENOKOT-S) 8.6-50 MG tablet Take 2 tablets by mouth 2 (two) times daily. 07/06/18   [provider]    Charlett Nose ,PharmD Clinical Pharmacist  09/28/2018  11:13 AM

## 2018-09-28 NOTE — ED Triage Notes (Addendum)
Pt comes via [facility] with c/o slurred speech. EMS reports home health nurse making first visit today with patient and noticed slurred speech and trouble answering questions at 0945.  EMS arrived to home at 10:15 and no symptoms present with their assessment.  Pt arrives with expressive aphasia. MD Jimmye Norman at bedside.  EMS reports pt was A&OX4, no stroke symptoms noted with them. VSS, BS-122.  Pt's husband at doctors appt. Unable to know pt's baseline at this time.

## 2018-09-28 NOTE — ED Notes (Signed)
Neurologist Dakakni at bedside, Minette Brine at bedside

## 2018-09-28 NOTE — Progress Notes (Signed)
Family Meeting Note  Advance Directive:yes  Today a meeting took place with the Patient.   The following clinical team members were present during this meeting:MD  The following were discussed:Patient's diagnosis: TIA with expressive aphasia, hypertensive urgency, hypokalemia, hyperlipidemia, hypothyroidism CML, coronary artery disease, anxiety and multiple other medical problems, is getting admitted to the hospital.  Patient is not a TPA candidate according to the neurology in view of thrombocytopenia.  Patient is agreeable with the treatment plan of care   patient's progosis: Unable to determine and Goals for treatment: Full Code husband healthcare POA  Additional follow-up to be provided: Hospitalist, neurology  Time spent during discussion:17 min  [name], MD

## 2018-09-28 NOTE — Code Documentation (Signed)
Pt arrives via EMS, per home health RN she noticed slurred speech at 0945 while assessing pt, per EMS when they arrived on scene at 1015 symptoms had resolved, upon arrival to ED pt demonstrates expressive aphasia, code stroke activated, after CT pt taken to room 2, Dr. Graciella Freer at bedside,  initial NIHSS 9, weakness in bilateral lower extremities, sensory deficit, dysarthria and aphasia present, initial SBP 200s, cleviprex started for BP control prior to tPA, pts labs revealed a platlet count of 85, decision to administer tPA canceled, family updated, report off to [employer]

## 2018-09-28 NOTE — Plan of Care (Signed)
  Problem: Education: Goal: Knowledge of disease or condition will improve Outcome: Progressing Goal: Knowledge of secondary prevention will improve Outcome: Progressing Goal: Knowledge of patient specific risk factors addressed and post discharge goals established will improve Outcome: Progressing   

## 2018-09-28 NOTE — ED Notes (Signed)
Pt given another warm blanket.  

## 2018-09-28 NOTE — ED Notes (Signed)
Pt pullup saturated with urine. Pt cleaned and placed in fresh diaper and gown. Peri care provided. Pt resting comfortably.

## 2018-09-28 NOTE — Progress Notes (Signed)
   09/28/18 1200  Clinical Encounter Type  Visited With Family;Patient and family together  Visit Type Initial;Spiritual support;Code;Critical Care;ED  Referral From Other (Comment)  Spiritual Encounters  Spiritual Needs Ritual;Emotional;Grief support;Other (Comment)  Stress Factors  Patient Stress Factors Exhausted;Loss;Loss of control;Major life changes;Other (Comment)  Family Stress Factors Exhausted;Family relationships;Loss;Major life changes;Other (Comment)

## 2018-09-28 NOTE — ED Notes (Signed)
Headed to CT room 1 at  This time

## 2018-09-29 DIAGNOSIS — R4701 Aphasia: Secondary | ICD-10-CM | POA: Diagnosis not present

## 2018-09-29 DIAGNOSIS — E039 Hypothyroidism, unspecified: Secondary | ICD-10-CM | POA: Diagnosis not present

## 2018-09-29 DIAGNOSIS — G459 Transient cerebral ischemic attack, unspecified: Secondary | ICD-10-CM | POA: Diagnosis not present

## 2018-09-29 DIAGNOSIS — E876 Hypokalemia: Secondary | ICD-10-CM | POA: Diagnosis not present

## 2018-09-29 DIAGNOSIS — R479 Unspecified speech disturbances: Secondary | ICD-10-CM | POA: Diagnosis not present

## 2018-09-29 DIAGNOSIS — I160 Hypertensive urgency: Secondary | ICD-10-CM | POA: Diagnosis not present

## 2018-09-29 LAB — CBC
HCT: 35.5 % — ABNORMAL LOW (ref 36.0–46.0)
Hemoglobin: 10.6 g/dL — ABNORMAL LOW (ref 12.0–15.0)
MCH: 18.8 pg — ABNORMAL LOW (ref 26.0–34.0)
MCHC: 29.9 g/dL — ABNORMAL LOW (ref 30.0–36.0)
MCV: 62.8 fL — ABNORMAL LOW (ref 80.0–100.0)
Platelets: 89 10*3/uL — ABNORMAL LOW (ref 150–400)
RBC: 5.65 MIL/uL — ABNORMAL HIGH (ref 3.87–5.11)
RDW: 17.2 % — ABNORMAL HIGH (ref 11.5–15.5)
WBC: 6.7 10*3/uL (ref 4.0–10.5)
nRBC: 0 % (ref 0.0–0.2)

## 2018-09-29 LAB — BASIC METABOLIC PANEL
Anion gap: 7 (ref 5–15)
BUN: 17 mg/dL (ref 8–23)
CO2: 26 mmol/L (ref 22–32)
Calcium: 9.7 mg/dL (ref 8.9–10.3)
Chloride: 105 mmol/L (ref 98–111)
Creatinine, Ser: 1.07 mg/dL — ABNORMAL HIGH (ref 0.44–1.00)
GFR calc Af Amer: 55 mL/min — ABNORMAL LOW (ref >60)
GFR calc non Af Amer: 47 mL/min — ABNORMAL LOW (ref >60)
Glucose, Bld: 96 mg/dL (ref 70–99)
Potassium: 4 mmol/L (ref 3.5–5.1)
Sodium: 138 mmol/L (ref 135–145)

## 2018-09-29 LAB — LIPID PANEL
Cholesterol: 130 mg/dL (ref 0–200)
HDL: 48 mg/dL (ref >40)
LDL Cholesterol: 74 mg/dL (ref 0–99)
Total CHOL/HDL Ratio: 2.7 RATIO
Triglycerides: 38 mg/dL (ref <150)
VLDL: 8 mg/dL (ref 0–40)

## 2018-09-29 LAB — HEMOGLOBIN A1C
Hgb A1c MFr Bld: 4.8 % (ref 4.8–5.6)
Mean Plasma Glucose: 91.06 mg/dL

## 2018-09-29 LAB — TSH: TSH: 2.714 u[IU]/mL (ref 0.350–4.500)

## 2018-09-29 MED ORDER — HYDRALAZINE HCL 25 MG PO TABS: 25.0000 mg | ORAL_TABLET | Freq: Three times a day (TID) | ORAL | Status: DC

## 2018-09-29 NOTE — Progress Notes (Signed)
SLP Cancellation Note  Patient Details Name: DAN DISSINGER MRN: [id number] DOB: 13-Feb-1931   Cancelled treatment:       Reason Eval/Treat Not Completed: SLP screened, no needs identified, will sign off. Chart reviewed, nsg interviewed. No evidence of aphasia, dysarthria, dysphagia, or cognitive linguistic deficits per screen. Spontaneous speech is 100% fluent, intelligible and well organized. No evidence of word finding difficulty throughout lengthy conversation with patient. Nsg reports toleration of current Regular diet with no s/s aspiration. No skilled SLP eval/tx indicated during acute level of stay, SLP to sign off at this time.   However, it is significant to note, observed pt w/moderate hoarse vocal quality. Pt reports she has suffered from hoarseness for some time now and occassionally loses her voice. She reports her husband is very HOH and refuses to wear his hearing aids and she has to yell all the time. Educated pt re: Insurance underwriter and recommendation for ENT evaluation and outpatient voice tx after d/c from hospital.    Rulon Eisenmenger, [place], [place] 09/29/2018, 2:36 PM

## 2018-09-29 NOTE — Evaluation (Signed)
Physical Therapy Evaluation Patient Details Name: Stephanie Harris MRN: [id number] DOB: 29-Nov-1930 Today's Date: 09/29/2018   History of Present Illness  85 y.o. female with past medical history significant for CAD, CML with thrombocytopenia, bilateral carotid disease status post right carotid stent, hypertension, and anxiety, chronic kidney disease who presented to the hospital with acute onset of difficulty speaking. Admitted for stroke work  up.  Imaging (CT, MRI) negative for acute infarct or large vessel occlusion to date.  Clinical Impression  Upon evaluation, patient alert and oriented; follows commands and agreeable to session.  Generally perseverative of multiple health conditions; hyperverbal requiring frequent cuing for attention to task. Patient generally weak and deconditioned, but without focal weakness, sensory or coordination deficit. Currently requiring min assist for bed mobility; min assist for unsupported sitting balance (due to posterior listing); min/mod assist for sit/stand, basic transfers and gait (5') with RW.  Choppy stepping performance with poor balance; high fall risk appreciated.  Do recommend use of RW and +1 assist at all times. Would benefit from skilled PT to address above deficits and promote optimal return to PLOF; recommend transition to STR upon discharge from acute hospitalization.     Follow Up Recommendations SNF    Equipment Recommendations       Recommendations for Other Services       Precautions / Restrictions Precautions Precautions: Fall Restrictions Weight Bearing Restrictions: No      Mobility  Bed Mobility Overal bed mobility: Needs Assistance Bed Mobility: Supine to Sit     Supine to sit: Min assist     General bed mobility comments: assist for truncal elevation and scooting towards edg eof bed  Transfers Overall transfer level: Needs assistance Equipment used: Rolling walker (2 wheeled) Transfers: Sit to/from Stand Sit to  Stand: Min assist;Mod assist         General transfer comment: cuing for movement mechanics, assist for lift off, standing balance  Ambulation/Gait Ambulation/Gait assistance: Min assist;Mod assist Gait Distance (Feet): 5 Feet Assistive device: Rolling walker (2 wheeled)       General Gait Details: very short, choppy steps with limited/no heel strike, toe off.  Min assist for walker position and management. Min/mod assist for balance due to posterior lean/weight shift; limited/no spontaneous balance reactions noted.  Fatigued quickly  Financial trader [place] (Stroke Patients Only)       Balance Overall balance assessment: History of Falls;Needs assistance Sitting-balance support: Single extremity supported;Feet supported;Feet unsupported Sitting balance-Leahy Scale: Fair Sitting balance - Comments: lists posteriorally with fatigue or divided attention Postural control: Posterior lean Standing balance support: Bilateral upper extremity supported Standing balance-Leahy Scale: Poor Standing balance comment: requires BUE on walker, posterior lean with limited/no self righting reactions                             Pertinent Vitals/Pain Pain Assessment: Faces Faces Pain Scale: Hurts a little bit Pain Location: R hip/thigh from recent fall Pain Descriptors / Indicators: Aching;Grimacing;Guarding Pain Intervention(s): Limited activity within patient's tolerance;Monitored during session;Repositioned    Home Living Family/patient expects to be discharged to:: Private residence Living Arrangements: Spouse/significant other;Children Available Help at Discharge: Available 24 hours/day Type of Home: Mobile home Home Access: Ramped entrance     Home Layout: One level Home Equipment: Environmental consultant - 4 wheels;Shower seat;Bedside commode;Cane - single point;Walker - 2 wheels Additional Comments: Uses  BSC over toilet at home    Prior  Function Level of Independence: Needs assistance   Gait / Transfers Assistance Needed: Pt ambulates with rollator.   ADL's / Homemaking Assistance Needed: Pt requires assist for ADLs/IADLs - spouse drives, assists with tub transfers, PRN assist for donning shoes, does most household chores  Comments: has an aide that comes in to help every day for IADLs     Hand Dominance   Dominant Hand: Right    Extremity/Trunk Assessment   Upper Extremity Assessment Upper Extremity Assessment: Generalized weakness(grossly 4-/5 throughout bilat UEs; grossly symmetrical without focal weakness, sensory or coordination deficit)    Lower Extremity Assessment Lower Extremity Assessment: (grossly 4-/5 throughout bilat UEs; grossly symmetrical without focal weakness, sensory or coordination deficit)    Cervical / Trunk Assessment Cervical / Trunk Assessment: Kyphotic  Communication   Communication: HOH(speech generally slow, requiring increased time/effort for articulation and clarity; no word-finding, comprehension difficulties.  Makes needs known clearly and easily)  Cognition Arousal/Alertness: Awake/alert Behavior During Therapy: WFL for tasks assessed/performed Overall Cognitive Status: Within Functional Limits for tasks assessed                                        General Comments      Exercises Other Exercises Other Exercises: pt educated in home/routines modifications to support energy conservation strategies   Assessment/Plan    PT Assessment Patient needs continued PT services  PT Problem List Decreased strength;Decreased activity tolerance;Decreased balance;Decreased mobility       PT Treatment Interventions DME instruction;Gait training;Functional mobility training;Therapeutic activities;Therapeutic exercise;Balance training;Neuromuscular re-education;Patient/family education    PT Goals (Current goals can be found in the Care Plan section)  Acute Rehab PT  Goals Patient Stated Goal: to get stronger PT Goal Formulation: With patient Time For Goal Achievement: 10/13/18 Potential to Achieve Goals: Good    Frequency Min 2X/week   Barriers to discharge Decreased caregiver support      Co-evaluation   Reason for Co-Treatment: To address functional/ADL transfers PT goals addressed during session: Mobility/safety with mobility OT goals addressed during session: ADL's and self-care       AM-PAC PT "6 Clicks" Mobility  Outcome Measure Help needed turning from your back to your side while in a flat bed without using bedrails?: A Little Help needed moving from lying on your back to sitting on the side of a flat bed without using bedrails?: A Little Help needed moving to and from a bed to a chair (including a wheelchair)?: A Lot Help needed standing up from a chair using your arms (e.g., wheelchair or bedside chair)?: A Lot Help needed to walk in hospital room?: A Lot Help needed climbing 3-5 steps with a railing? : A Lot 6 Click Score: 14    End of Session Equipment Utilized During Treatment: Gait belt Activity Tolerance: Patient tolerated treatment well Patient left: in chair;with call bell/phone within reach;with chair alarm set(OT at bedside for completion of eval)   PT Visit Diagnosis: Muscle weakness (generalized) (M62.81);Difficulty in walking, not elsewhere classified (R26.2)    Time: 1610-9604 PT Time Calculation (min) (ACUTE ONLY): 24 min   Charges:   PT Evaluation $PT Eval Moderate Complexity: 1 Mod          Taetum Flewellen H. Owens Shark, PT, DPT, NCS 09/29/18, 3:27 PM [phone]

## 2018-09-29 NOTE — Evaluation (Signed)
Occupational Therapy Evaluation Patient Details Name: Stephanie Harris MRN: [id number] DOB: Apr 03, 1931 Today's Date: 09/29/2018    History of Present Illness 85 y.o. female with past medical history significant for CAD, CML with thrombocytopenia, bilateral carotid disease status post right carotid stent, hypertension, and anxiety, chronic kidney disease who presented to the hospital with acute onset of difficulty speaking. Admitted for stroke work  up.   Clinical Impression   Pt seen for OT evaluation this date. Prior to hospital admission, pt was generally independent at home with decreased family support. Pt reports increased fatigue 2/2 CML.  Pt lives her spouse and adult son who has medical issues.  Currently pt demonstrates impairments in strength, balance, very mild speech difficulties ("I know what I want to say but just can't" - although able to clearly response appropriately) requiring MOD assist for LB ADL and MIN-MOD A for functional mobility with RW. Pt educated in benefits of energy conservation strategies and how to implement during ADL. Pt would benefit from skilled OT to address noted impairments and functional limitations (see below for any additional details) in order to maximize safety and independence while minimizing falls risk and caregiver burden.  Upon hospital discharge, recommend pt discharge to STR due to decreased caregiver assist available and pt's increased need for assist in all aspects.    Follow Up Recommendations  SNF    Equipment Recommendations  None recommended by OT    Recommendations for Other Services       Precautions / Restrictions Precautions Precautions: Fall Restrictions Weight Bearing Restrictions: No      Mobility Bed Mobility Overal bed mobility: Needs Assistance Bed Mobility: Supine to Sit           General bed mobility comments: assist provided from PT, please see PT eval  Transfers Overall transfer level: Needs  assistance Equipment used: Rolling walker (2 wheeled) Transfers: Sit to/from Stand Sit to Stand: Min assist;Mod assist         General transfer comment: OT providing sup as +2 for safety, PT hands on for functional mobility    Balance Overall balance assessment: History of Falls;Needs assistance Sitting-balance support: Single extremity supported;Feet supported;Feet unsupported Sitting balance-Leahy Scale: Fair Sitting balance - Comments: occasional verbal and tactile cues to improve sitting posture, posterior lean, decreased righting ability   Standing balance support: Bilateral upper extremity supported Standing balance-Leahy Scale: Poor Standing balance comment: requires BUE on walker                            ADL either performed or assessed with clinical judgement   ADL Overall ADL's : Needs assistance/impaired Eating/Feeding: Set up;Sitting   Grooming: Sitting;Set up   Upper Body Bathing: Sitting;Minimal assistance   Lower Body Bathing: Sit to/from stand;Moderate assistance   Upper Body Dressing : Sitting;Minimal assistance   Lower Body Dressing: Sit to/from stand;Moderate assistance   Toilet Transfer: RW;Minimal assistance;Moderate assistance;Ambulation;BSC                   Vision Baseline Vision/History: Wears glasses Wears Glasses: At all times Patient Visual Report: No change from baseline       Perception     Praxis      Pertinent Vitals/Pain Pain Assessment: Faces Faces Pain Scale: Hurts a little bit Pain Location: R hip from recent fall Pain Descriptors / Indicators: Aching;Grimacing;Guarding Pain Intervention(s): Limited activity within patient's tolerance;Monitored during session;Repositioned     Hand Dominance Right  Extremity/Trunk Assessment Upper Extremity Assessment Upper Extremity Assessment: Generalized weakness   Lower Extremity Assessment Lower Extremity Assessment: Generalized weakness   Cervical / Trunk  Assessment Cervical / Trunk Assessment: Kyphotic   Communication Communication Communication: HOH   Cognition Arousal/Alertness: Awake/alert Behavior During Therapy: WFL for tasks assessed/performed Overall Cognitive Status: Within Functional Limits for tasks assessed                                     General Comments       Exercises Other Exercises Other Exercises: pt educated in home/routines modifications to support energy conservation strategies   Shoulder Instructions      Home Living Family/patient expects to be discharged to:: Private residence Living Arrangements: Spouse/significant other;Children Available Help at Discharge: Available 24 hours/day Type of Home: Mobile home Home Access: Ramped entrance     Home Layout: One level     Bathroom Shower/Tub: Teacher, early years/pre: Handicapped height     Home Equipment: Environmental consultant - 4 wheels;Shower seat;Bedside commode;Cane - single point;Walker - 2 wheels   Additional Comments: Uses BSC over toilet at home      Prior Functioning/Environment Level of Independence: Needs assistance  Gait / Transfers Assistance Needed: Pt ambulates with rollator.  ADL's / Homemaking Assistance Needed: Pt requires assist for ADLs/IADLs - spouse drives, assists with tub transfers, PRN assist for donning shoes, does most household chores   Comments: has an aide that comes in to help every day for IADLs        OT Problem List: Decreased strength;Decreased knowledge of use of DME or AE;Decreased activity tolerance;Impaired balance (sitting and/or standing);Pain      OT Treatment/Interventions: Self-care/ADL training;Balance training;Therapeutic exercise;Therapeutic activities;Energy conservation;DME and/or AE instruction;Patient/family education    OT Goals(Current goals can be found in the care plan section) Acute Rehab OT Goals Patient Stated Goal: to go home OT Goal Formulation: With patient Time For  Goal Achievement: 10/13/18 Potential to Achieve Goals: Good ADL Goals Pt Will Perform Lower Body Dressing: with min guard assist;sit to/from stand;with adaptive equipment Pt Will Transfer to Toilet: with min guard assist;ambulating;bedside commode(LRAD for amb) Additional ADL Goal #1: Pt will verbalize plan for implementing at least 1 learned ECS at home to maximize safety/indep.  OT Frequency: Min 1X/week   Barriers to D/C: Decreased caregiver support          Co-evaluation PT/OT/SLP Co-Evaluation/Treatment: Yes Reason for Co-Treatment: For patient/therapist safety;To address functional/ADL transfers PT goals addressed during session: Mobility/safety with mobility;Balance;Proper use of DME OT goals addressed during session: Proper use of Adaptive equipment and DME;ADL's and self-care      AM-PAC OT "6 Clicks" Daily Activity     Outcome Measure Help from another person eating meals?: None Help from another person taking care of personal grooming?: None Help from another person toileting, which includes using toliet, bedpan, or urinal?: A Lot Help from another person bathing (including washing, rinsing, drying)?: A Lot Help from another person to put on and taking off regular upper body clothing?: A Little Help from another person to put on and taking off regular lower body clothing?: A Lot 6 Click Score: 17   End of Session Equipment Utilized During Treatment: Gait belt;Rolling walker  Activity Tolerance: Patient tolerated treatment well Patient left: in chair;with call bell/phone within reach;with chair alarm set;with nursing/sitter in room  OT Visit Diagnosis: Other abnormalities of gait and mobility (R26.89);Muscle  weakness (generalized) (M62.81);History of falling (Z91.81);Pain;Dizziness and giddiness (R42) Pain - Right/Left: Right Pain - part of body: Hip                Time: 4199-1444 OT Time Calculation (min): 37 min Charges:  OT General Charges $OT Visit: 1 Visit OT  Evaluation $OT Eval Low Complexity: 1 Low OT Treatments $Self Care/Home Management : 8-22 mins  Jeni Salles, MPH, MS, OTR/L ascom [phone] 09/29/18, 1:03 PM

## 2018-09-29 NOTE — Progress Notes (Signed)
Brief History Stephanie Harris is an 85 y.o. female with past medical history significant for CAD, CML with thrombocytopenia, bilateral carotid disease status post right carotid stent, hypertension, and anxiety, chronic kidney disease who presented to the hospital with acute onset of difficulty speaking, In the emergency room patient systolic blood pressure was over 200 with low platelets count for that reason IVTPA was not given, CTA showed no LVO. Pt was admitted for stroke work up and hypertension urgency management.   Subjective Feels better, speech is almost back to baseline, SBP still elevated, MRI was done and showed no acute changes.   Past Medical History Past Medical History:  Diagnosis Date  . Acute bronchiolitis   . Acute pharyngitis   . Allergic rhinitis due to pollen   . Anemia, unspecified    CKD and LGL  . Anxiety state    unspecified  . Arthritis    "knees, legs" (03/18/2018)  . Asthma without status asthmaticus    unspecified  . B12 deficiency   . Beta thalassemia trait   . Carotid artery occlusion   . Cellulitis and abscess of leg, except foot   . Cervical spondylosis   . Cervicalgia   . Chronic kidney disease   . Chronic lower back pain   . CML (chronic myelocytic leukemia) ([place])    Onc at [facility]  . Complication of anesthesia    "last back surgery they liked to never get me awake" (03/18/2018)  . Coronary artery disease 2009   heart attack with stent  . Coronary atherosclerosis of native coronary artery   . Depressive disorder    not elsewhere classified  . Difficulty in walking   . Disorder of breast, unspecified   . Dizziness and giddiness   . Dysuria   . Esophageal reflux   . Essential hypertension    unspecified  . Family history of adverse reaction to anesthesia    "liked to never get my sister awake" (03/18/2018)  . Head injury    unspecified  . Headache    "q time I have a stroke" (03/18/2018)  . Heart disease   . Hematuria, unspecified   .  History of blood transfusion    "had 22 when they found out I had leukemia" (03/18/2018)  . Hypersomnia, unspecified   . Hypertension   . Hypopotassemia   . Hypothyroidism   . ICH (intracerebral hemorrhage) ([place])   . Ill-defined cerebrovascular disease    other  . Insomnia    unspecified  . 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 Leukemia ([place])   . Lower urinary tract infection   . Lumbago   . Mini stroke ([place])    x years  . Mixed hyperlipidemia   . Myocardial infarction ([place]) ~2017  . Nontoxic nodular goiter    unspecified  . Occlusion and stenosis of unspecified carotid artery    without mention of cerebral infarction  . Osteoarthritis   . Osteoporosis   . Other constipation   . Other vitamin B12 deficiency anemias   . Otitis media, unspecified, unspecified ear   . Ovarian failure    unspecified  . Pain in limb   . Panic disorder without agoraphobia   . Peripheral vascular disease ([place])    unspecified  . Phlebitis of left arm   . RA (rheumatoid arthritis) ([place])   . Sigmoid polyp 1998  . Sleep disturbance    unspecified  . Stroke ([place]) 02/2017   "lots of mini strokes; big one  02/2017; that one made me weak in my knees,; never fully recovered" (03/18/2018)  . Syncope and collapse   . Thalassemia   . TIA (transient ischemic attack)    2000 and 2008 right carotid stent 08/14/2009 on Plavix  . TIA (transient ischemic attack) 03/18/2018  . Transient disorder of initiating or maintaining sleep   . Type II diabetes mellitus ([place])   . Varicose veins of bilateral lower extremities with other complications   . Varicose veins of bilateral lower extremities with other complications   . Vision abnormalities     Past Surgical History Past Surgical History:  Procedure Laterality Date  . BACK SURGERY    . CAROTID ENDARTERECTOMY Left   . CAROTID STENT INSERTION Right    "have 2 stents in there" (03/18/2018)  . CATARACT EXTRACTION W/ INTRAOCULAR LENS  IMPLANT,  BILATERAL Bilateral 06/16/2001 - 05/28/2003   +23.5D     22.5D  . CHOLECYSTECTOMY OPEN  1980  . COLONOSCOPY  2010  . CORONARY ANGIOPLASTY WITH STENT PLACEMENT    . EYELID SURGERY Bilateral 2005   BUL BLEPH  . FRACTURE SURGERY    . JOINT REPLACEMENT    . KYPHOPLASTY N/A 08/12/2017   Procedure: KYPHOPLASTY;  Surgeon: Hessie Knows, MD;  Location: [facility] ORS;  Service: Orthopedics;  Laterality: N/A;  . KYPHOPLASTY N/A 11/08/2017   Procedure: Hewitt Shorts;  Surgeon: Hessie Knows, MD;  Location: [facility] ORS;  Service: Orthopedics;  Laterality: N/A;  . PERCUTANEOUS PLACEMENT INTRAVASCULAR STENT CERVICAL CAROTID ARTERY  06/14/2009  . ROTATOR CUFF REPAIR Left   . TOTAL ABDOMINAL HYSTERECTOMY  1978   WITH REMOVAL TUBES & /OR OVARIES  . TOTAL KNEE ARTHROPLASTY Right     Allergies Allergies  Allergen Reactions  . Latex Rash  . Meperidine     Other reaction(s): Other (See Comments) Other Reaction: CNS Disorder  . Penicillins Other (See Comments)    Has patient had a PCN reaction causing immediate rash, facial/tongue/throat swelling, SOB or lightheadedness with hypotension: Unknown Has patient had a PCN reaction causing severe rash involving mucus membranes or skin necrosis: Unknown Has patient had a PCN reaction that required hospitalization: Unknown Has patient had a PCN reaction occurring within the last 10 years: Unknown If all of the above answers are "NO", then may proceed with Cephalosporin use.   . Cefuroxime Axetil Nausea And Vomiting  . Codeine Nausea And Vomiting and Nausea Only  . Propoxyphene Nausea Only    Other reaction(s): Vomiting    Home Medications Medications Prior to Admission  Medication Sig Dispense Refill  . acetaminophen (TYLENOL) 500 MG tablet Take 1,000 mg by mouth every 8 (eight) hours.    Marland Kitchen atorvastatin (LIPITOR) 20 MG tablet Take 1 tablet (20 mg total) by mouth daily at 6 PM. 30 tablet 0  . levothyroxine (SYNTHROID, LEVOTHROID) 50 MCG tablet Take 1 tablet  (50 mcg total) by mouth daily. 30 tablet 0  . linaclotide (LINZESS) 145 MCG CAPS capsule Take 1 capsule (145 mcg total) by mouth daily before breakfast. 30 capsule 3  . ondansetron (ZOFRAN ODT) 4 MG disintegrating tablet Take 1 tablet (4 mg total) by mouth every 8 (eight) hours as needed for nausea or vomiting. 10 tablet 0  . oxyCODONE (OXY IR/ROXICODONE) 5 MG immediate release tablet Take 2.5-5 mg by mouth every 6 (six) hours as needed for moderate pain or severe pain.      Hospital Medications . aspirin  300 mg Rectal Daily   Or  . aspirin  325 mg  Oral Daily  . atorvastatin  80 mg Oral q1800  . enoxaparin (LOVENOX) injection  30 mg Subcutaneous Q24H  . levothyroxine  50 mcg Oral QAC breakfast  . linaclotide  145 mcg Oral QAC breakfast  . metoprolol tartrate  25 mg Oral BID       Intake/Output from previous day: 02/05 0701 - 02/06 0700 In: -  Out: 200 [Urine:200] Intake/Output this shift: No intake/output data recorded. Nutritional status:  Diet Order            Diet Heart Room service appropriate? Yes; Fluid consistency: Thin  Diet effective now               Physical Exam  Vitals:   09/28/18 1711 09/28/18 2037 09/29/18 0343 09/29/18 0806  BP: (!) 172/66 (!) 154/51 (!) 158/57 (!) 204/85  Pulse: 79 62 63 64  Resp: 16   18  Temp: (!) 97.5 F (36.4 C) (!) 97.5 F (36.4 C) 97.6 F (36.4 C) 97.7 F (36.5 C)  TempSrc:  Oral Oral   SpO2: 100% 100% 100% 100%  Weight:       General - NAD Heart - Regular rate and rhythm - no murmer Lungs - Clear to auscultation Abdomen - Soft - non tender Extremities - Distal pulses intact - no edema Skin - Warm and dry  Neurologic Exam:   Mental Status: Alert, oriented to self, place and time, follows commands with speech intact, no aphasia   Visual fields grossly normal, pupils equal, round, reactive to light, smile symmetric, facial light touch intact, midline tongue extension Motor: RUE - 4/5                                             LUE - 4/5   RLE - 4/5                                 LLE - 4/5   normal sensory exam .   LABORATORY RESULTS:  Basic Metabolic Panel: Recent Labs  Lab 09/28/18 1103 09/29/18 0807  NA 139 138  K 3.1* 4.0  CL 105 105  CO2 28 26  GLUCOSE 108* 96  BUN 19 17  CREATININE 1.10* 1.07*  CALCIUM 9.6 9.7    Liver Function Tests: Recent Labs  Lab 09/28/18 1103  AST 20  ALT 13  ALKPHOS 26*  BILITOT 2.0*  PROT 7.2  ALBUMIN 4.2   No results for input(s): LIPASE, AMYLASE in the last 168 hours. No results for input(s): AMMONIA in the last 168 hours.  CBC: Recent Labs  Lab 09/28/18 1103 09/29/18 0807  WBC 4.3 6.7  NEUTROABS 1.0*  --   HGB 10.4* 10.6*  HCT 34.9* 35.5*  MCV 62.7* 62.8*  PLT 85* 89*    Cardiac Enzymes: Recent Labs  Lab 09/28/18 1103  TROPONINI <0.03    Lipid Panel: Recent Labs  Lab 09/29/18 0458  CHOL 130  TRIG 38  HDL 48  CHOLHDL 2.7  VLDL 8  LDLCALC 74    CBG: Recent Labs  Lab 09/28/18 1125  GLUCAP 106*    Microbiology:   Coagulation Studies: Recent Labs    09/28/18 1103  LABPROT 14.0  INR 1.09    Miscellaneous Labs:   IMAGING RESULTS Ct Angio Head W Or Wo Contrast  Result Date: 09/28/2018 CLINICAL DATA:  Follow-up code stroke. Slurred speech. Mental status changes. EXAM: CT ANGIOGRAPHY HEAD AND NECK TECHNIQUE: Multidetector CT imaging of the head and neck was performed using the standard protocol during bolus administration of intravenous contrast. Multiplanar CT image reconstructions and MIPs were obtained to evaluate the vascular anatomy. Carotid stenosis measurements (when applicable) are obtained utilizing NASCET criteria, using the distal internal carotid diameter as the denominator. CONTRAST:  48mL OMNIPAQUE IOHEXOL 350 MG/ML SOLN COMPARISON:  Head CT same day.  Multiple previous studies 2019. FINDINGS: CTA NECK FINDINGS Aortic arch: The aorta shows atherosclerotic change but no aneurysm or dissection.  Branching pattern is normal. Right carotid system: Common carotid artery is tortuous but widely patent to the bifurcation. There is a carotid stent from the distal common carotid artery into the internal carotid artery. The stent appears patent without evidence of significant stenosis. There may be mild intimal thickening in the midportion. Beyond the stent, the internal carotid artery is widely patent to the skull base. Left carotid system: Common carotid artery is tortuous but widely patent to the bifurcation. There is calcified plaque at the carotid bifurcation and ICA bulb. Minimal diameter in the ICA bulb measures 3.5 mm. Compared to a more distal cervical ICA diameter of 4 mm, this represents only a 10-20% stenosis. Cervical ICA widely patent beyond that. Vertebral arteries: Calcified plaque at the left vertebral artery origin with stenosis of 50%. Beyond the origin, the vessel is widely patent to the foramen magnum. Right vertebral artery origin is widely patent. There is focal calcified plaque just distal to the origin with stenosis estimated at 75%. Beyond that, the vessel is widely patent through the cervical region to the foramen magnum. Skeleton: Ordinary spondylosis. Other neck: No mass or lymphadenopathy. Upper chest: Mild pulmonary scarring and dependent atelectasis. Review of the MIP images confirms the above findings CTA HEAD FINDINGS Anterior circulation: Both internal carotid arteries are widely patent through the skull base and siphon regions. There is ordinary siphon atherosclerotic calcification but no stenosis greater than 20%. The anterior and middle cerebral vessels are patent without large or medium vessel occlusion. No correctable proximal stenosis. Posterior circulation: Both vertebral arteries are patent through the foramen magnum to the basilar. No basilar stenosis. Posterior circulation branch vessels appear patent. There is mild distal vessel atherosclerotic irregularity. Venous  sinuses: Patent and normal. Anatomic variants: None significant. Delayed phase: No abnormal enhancement. Review of the MIP images confirms the above findings IMPRESSION: Right carotid stent is patent without significant stenosis. There is probably mild intimal thickening in the midportion of the stent. Atherosclerotic disease at the left carotid bifurcation. Narrowing in the ICA bulb is only on the order of 10-20% when compared to the diameter of the more distal cervical ICA. 50% stenosis of the left vertebral artery origin, with wide patency beyond that. 75% stenosis just distal to the right vertebral artery origin with wide patency beyond that. No intracranial large or medium vessel occlusion or correctable proximal stenosis. Electronically Signed   By: Nelson Chimes M.D.   On: 09/28/2018 12:38   Ct Angio Neck W And/or Wo Contrast  Result Date: 09/28/2018 CLINICAL DATA:  Follow-up code stroke. Slurred speech. Mental status changes. EXAM: CT ANGIOGRAPHY HEAD AND NECK TECHNIQUE: Multidetector CT imaging of the head and neck was performed using the standard protocol during bolus administration of intravenous contrast. Multiplanar CT image reconstructions and MIPs were obtained to evaluate the vascular anatomy. Carotid stenosis measurements (when applicable) are obtained utilizing  NASCET criteria, using the distal internal carotid diameter as the denominator. CONTRAST:  70mL OMNIPAQUE IOHEXOL 350 MG/ML SOLN COMPARISON:  Head CT same day.  Multiple previous studies 2019. FINDINGS: CTA NECK FINDINGS Aortic arch: The aorta shows atherosclerotic change but no aneurysm or dissection. Branching pattern is normal. Right carotid system: Common carotid artery is tortuous but widely patent to the bifurcation. There is a carotid stent from the distal common carotid artery into the internal carotid artery. The stent appears patent without evidence of significant stenosis. There may be mild intimal thickening in the midportion.  Beyond the stent, the internal carotid artery is widely patent to the skull base. Left carotid system: Common carotid artery is tortuous but widely patent to the bifurcation. There is calcified plaque at the carotid bifurcation and ICA bulb. Minimal diameter in the ICA bulb measures 3.5 mm. Compared to a more distal cervical ICA diameter of 4 mm, this represents only a 10-20% stenosis. Cervical ICA widely patent beyond that. Vertebral arteries: Calcified plaque at the left vertebral artery origin with stenosis of 50%. Beyond the origin, the vessel is widely patent to the foramen magnum. Right vertebral artery origin is widely patent. There is focal calcified plaque just distal to the origin with stenosis estimated at 75%. Beyond that, the vessel is widely patent through the cervical region to the foramen magnum. Skeleton: Ordinary spondylosis. Other neck: No mass or lymphadenopathy. Upper chest: Mild pulmonary scarring and dependent atelectasis. Review of the MIP images confirms the above findings CTA HEAD FINDINGS Anterior circulation: Both internal carotid arteries are widely patent through the skull base and siphon regions. There is ordinary siphon atherosclerotic calcification but no stenosis greater than 20%. The anterior and middle cerebral vessels are patent without large or medium vessel occlusion. No correctable proximal stenosis. Posterior circulation: Both vertebral arteries are patent through the foramen magnum to the basilar. No basilar stenosis. Posterior circulation branch vessels appear patent. There is mild distal vessel atherosclerotic irregularity. Venous sinuses: Patent and normal. Anatomic variants: None significant. Delayed phase: No abnormal enhancement. Review of the MIP images confirms the above findings IMPRESSION: Right carotid stent is patent without significant stenosis. There is probably mild intimal thickening in the midportion of the stent. Atherosclerotic disease at the left carotid  bifurcation. Narrowing in the ICA bulb is only on the order of 10-20% when compared to the diameter of the more distal cervical ICA. 50% stenosis of the left vertebral artery origin, with wide patency beyond that. 75% stenosis just distal to the right vertebral artery origin with wide patency beyond that. No intracranial large or medium vessel occlusion or correctable proximal stenosis. Electronically Signed   By: Nelson Chimes M.D.   On: 09/28/2018 12:38   Mr Brain Wo Contrast  Result Date: 09/28/2018 CLINICAL DATA:  85 y/o  F; speech difficulty. EXAM: MRI HEAD WITHOUT CONTRAST MRA HEAD WITHOUT CONTRAST TECHNIQUE: Multiplanar, multiecho pulse sequences of the brain and surrounding structures were obtained without intravenous contrast. Angiographic images of the head were obtained using MRA technique without contrast. COMPARISON:  09/28/2018 CT head and CTA head. FINDINGS: MRI HEAD FINDINGS Brain: No acute infarction, hemorrhage, hydrocephalus, extra-axial collection or mass lesion. Partially empty sella turcica. Large confluent nonspecific T2 FLAIR hyperintensities in subcortical and periventricular white matter are compatible with advanced chronic microvascular ischemic changes. Moderate volume loss of the brain. Small chronic infarcts are present in bifrontal periventricular white matter. Small cortical infarcts are present in the right superior frontal lobe and the left occipital  lobe. Very small chronic infarcts are present within the inferior cerebellar hemispheres bilateral as well as the bilateral lentiform nuclei. There is hemosiderin staining of the lentiform nuclei infarcts. Vascular: Normal flow voids. Skull and upper cervical spine: Normal marrow signal. Hyperostosis frontalis interna. Sinuses/Orbits: Negative. Other: Bilateral intra-ocular lens replacement. MRA HEAD FINDINGS Internal carotid arteries: Patent. Mild non stenotic lumen irregularity compatible with atherosclerosis. Anterior cerebral  arteries:  Patent. Middle cerebral arteries: Patent. Anterior communicating artery: Patent. Posterior communicating arteries: Not identified, likely hypoplastic or absent. Posterior cerebral arteries:  Patent. Basilar artery:  Patent. Vertebral arteries:  Patent. No evidence of high-grade stenosis, large vessel occlusion, or aneurysm unless noted above. IMPRESSION: MRI head: 1. No acute intracranial abnormality identified. 2. Stable advanced chronic microvascular ischemic changes and moderate volume loss of the brain. Multiple small chronic infarctions are present in bifrontal periventricular white matter, right frontal cortex, left occipital cortex, and bilateral cerebellar hemispheres. MRA head: Patent anterior and posterior intracranial circulation. No large vessel occlusion, aneurysm, or significant stenosis is identified. Electronically Signed   By: Kristine Garbe M.D.   On: 09/28/2018 22:54   Mr Jodene Nam Head/brain OA Cm  Result Date: 09/28/2018 CLINICAL DATA:  85 y/o  F; speech difficulty. EXAM: MRI HEAD WITHOUT CONTRAST MRA HEAD WITHOUT CONTRAST TECHNIQUE: Multiplanar, multiecho pulse sequences of the brain and surrounding structures were obtained without intravenous contrast. Angiographic images of the head were obtained using MRA technique without contrast. COMPARISON:  09/28/2018 CT head and CTA head. FINDINGS: MRI HEAD FINDINGS Brain: No acute infarction, hemorrhage, hydrocephalus, extra-axial collection or mass lesion. Partially empty sella turcica. Large confluent nonspecific T2 FLAIR hyperintensities in subcortical and periventricular white matter are compatible with advanced chronic microvascular ischemic changes. Moderate volume loss of the brain. Small chronic infarcts are present in bifrontal periventricular white matter. Small cortical infarcts are present in the right superior frontal lobe and the left occipital lobe. Very small chronic infarcts are present within the inferior cerebellar  hemispheres bilateral as well as the bilateral lentiform nuclei. There is hemosiderin staining of the lentiform nuclei infarcts. Vascular: Normal flow voids. Skull and upper cervical spine: Normal marrow signal. Hyperostosis frontalis interna. Sinuses/Orbits: Negative. Other: Bilateral intra-ocular lens replacement. MRA HEAD FINDINGS Internal carotid arteries: Patent. Mild non stenotic lumen irregularity compatible with atherosclerosis. Anterior cerebral arteries:  Patent. Middle cerebral arteries: Patent. Anterior communicating artery: Patent. Posterior communicating arteries: Not identified, likely hypoplastic or absent. Posterior cerebral arteries:  Patent. Basilar artery:  Patent. Vertebral arteries:  Patent. No evidence of high-grade stenosis, large vessel occlusion, or aneurysm unless noted above. IMPRESSION: MRI head: 1. No acute intracranial abnormality identified. 2. Stable advanced chronic microvascular ischemic changes and moderate volume loss of the brain. Multiple small chronic infarctions are present in bifrontal periventricular white matter, right frontal cortex, left occipital cortex, and bilateral cerebellar hemispheres. MRA head: Patent anterior and posterior intracranial circulation. No large vessel occlusion, aneurysm, or significant stenosis is identified. Electronically Signed   By: Kristine Garbe M.D.   On: 09/28/2018 22:54   Ct Head Code Stroke Wo Contrast  Result Date: 09/28/2018 CLINICAL DATA:  Code stroke.  Slurred speech, aphasia EXAM: CT HEAD WITHOUT CONTRAST TECHNIQUE: Contiguous axial images were obtained from the base of the skull through the vertex without intravenous contrast. COMPARISON:  CT head 06/04/2018 FINDINGS: Brain: Moderate atrophy. Extensive chronic microvascular ischemic change throughout the white matter. Chronic infarct right frontal lobe. Small chronic infarcts cerebellum bilaterally unchanged. Negative for acute infarct. Negative for hemorrhage or mass.  No midline shift. Vascular: Negative for hyperdense vessel. Atherosclerotic disease in the cavernous carotid bilaterally. Skull: Negative Sinuses/Orbits: Negative Other: None ASPECTS ([place] Stroke Program Early CT Score) - Ganglionic level infarction (caudate, lentiform nuclei, internal capsule, insula, M1-M3 cortex): 7 - Supraganglionic infarction (M4-M6 cortex): 3 Total score (0-10 with 10 being normal): 10 IMPRESSION: 1. No acute intracranial abnormality. 2. Atrophy and extensive chronic ischemic change. 3. ASPECTS is 10 4. These results were called by telephone at the time of interpretation on 09/28/2018 at 11:29 am to Dr. Lenise Arena , who verbally acknowledged these results. Electronically Signed   By: Franchot Gallo M.D.   On: 09/28/2018 11:30       Assessment/Plan:  85 y.o. female with past medical history significant for CAD, CML with thrombocytopenia, bilateral carotid disease status post right carotid stent, hypertension  who presented to the hospital with acute onset of difficulty speaking in the setting of elevated BP, pt was not given IVTPA due to thrombocytopenia and an elevated SBP>200.  -MRI brain reviewed, no acute changes.  -Hypertensive urgency needs better control of BP -Continue ASA and Lipitor for stroke prophylaxis  -Stable thrombocytopenia  -PT/OT and speech evaluation -Plan discussed in details with the patient.   Arnaldo Natal, MD

## 2018-09-29 NOTE — Progress Notes (Signed)
[facility] NAME: Kalyna Paolella    MR#:  [id number]  DATE OF BIRTH:  12/09/1930  SUBJECTIVE:  CHIEF COMPLAINT:   Chief Complaint  Patient presents with  . slurred speech   Patient resting in bed eating breakfast when seen. Speech fluent. No pain, discomfort, or complaints otherwise when asked. Does not feel ready to go home today.   REVIEW OF SYSTEMS:  Review of Systems  Constitutional: Negative for chills and fever.  HENT: Negative for congestion and sore throat.   Eyes: Negative for blurred vision and double vision.       Has macular degeneration, wears glasses  Respiratory: Negative for cough, shortness of breath and wheezing.   Cardiovascular: Negative for chest pain and palpitations.  Gastrointestinal: Negative for abdominal pain.  Genitourinary: Negative for dysuria.  Musculoskeletal: Negative for myalgias.  Skin: Negative for itching and rash.  Neurological: Positive for headaches (mild headache, improved from yesterday). Negative for dizziness, tingling, sensory change, speech change, focal weakness, loss of consciousness and weakness.  Psychiatric/Behavioral: Negative for memory loss. The patient does not have insomnia.     DRUG ALLERGIES:   Allergies  Allergen Reactions  . Latex Rash  . Meperidine     Other reaction(s): Other (See Comments) Other Reaction: CNS Disorder  . Penicillins Other (See Comments)    Has patient had a PCN reaction causing immediate rash, facial/tongue/throat swelling, SOB or lightheadedness with hypotension: Unknown Has patient had a PCN reaction causing severe rash involving mucus membranes or skin necrosis: Unknown Has patient had a PCN reaction that required hospitalization: Unknown Has patient had a PCN reaction occurring within the last 10 years: Unknown If all of the above answers are "NO", then may proceed with Cephalosporin use.   . Cefuroxime Axetil Nausea And Vomiting  .  Codeine Nausea And Vomiting and Nausea Only  . Propoxyphene Nausea Only    Other reaction(s): Vomiting   VITALS:  Blood pressure (!) 148/49, pulse 60, temperature 97.7 F (36.5 C), resp. rate 18, weight 52.9 kg, SpO2 100 %. PHYSICAL EXAMINATION:  GENERAL:  Frail 85 y.o.-year-old patient lying in the bed with no acute distress.  EYES: Pupils equal, round, reactive to light and accommodation. No scleral icterus. Extraocular muscles intact.  HEENT: Head atraumatic, normocephalic. Oropharynx and nasopharynx clear.  NECK:  Supple, no jugular venous distention.  LUNGS: Normal breath sounds bilaterally, no wheezing, rales, rhonchi or crepitation. No use of accessory muscles of respiration.  CARDIOVASCULAR: S1, S2 normal. No murmurs, rubs, or gallops.  ABDOMEN: Soft, nontender, nondistended. Bowel sounds present. No organomegaly or mass.  EXTREMITIES: No pedal edema, cyanosis, or clubbing.  NEUROLOGIC: Awake, alert and oriented x3. Muscle strength 3/5 in all extremities. Sensation intact. Gait not checked.  No expressive aphasia noted during exam. PSYCHIATRIC: The patient is alert and oriented x 3.  SKIN: No obvious rash, lesion, or ulcer.  LABORATORY PANEL:  Female CBC Recent Labs  Lab 09/29/18 0807  WBC 6.7  HGB 10.6*  HCT 35.5*  PLT 89*   ------------------------------------------------------------------------------------------------------------------ Chemistries  Recent Labs  Lab 09/28/18 1103 09/29/18 0807  NA 139 138  K 3.1* 4.0  CL 105 105  CO2 28 26  GLUCOSE 108* 96  BUN 19 17  CREATININE 1.10* 1.07*  CALCIUM 9.6 9.7  AST 20  --   ALT 13  --   ALKPHOS 26*  --   BILITOT 2.0*  --    RADIOLOGY:  Mr Brain  Wo Contrast  Result Date: 09/28/2018 CLINICAL DATA:  85 y/o  F; speech difficulty. EXAM: MRI HEAD WITHOUT CONTRAST MRA HEAD WITHOUT CONTRAST TECHNIQUE: Multiplanar, multiecho pulse sequences of the brain and surrounding structures were obtained without intravenous  contrast. Angiographic images of the head were obtained using MRA technique without contrast. COMPARISON:  09/28/2018 CT head and CTA head. FINDINGS: MRI HEAD FINDINGS Brain: No acute infarction, hemorrhage, hydrocephalus, extra-axial collection or mass lesion. Partially empty sella turcica. Large confluent nonspecific T2 FLAIR hyperintensities in subcortical and periventricular white matter are compatible with advanced chronic microvascular ischemic changes. Moderate volume loss of the brain. Small chronic infarcts are present in bifrontal periventricular white matter. Small cortical infarcts are present in the right superior frontal lobe and the left occipital lobe. Very small chronic infarcts are present within the inferior cerebellar hemispheres bilateral as well as the bilateral lentiform nuclei. There is hemosiderin staining of the lentiform nuclei infarcts. Vascular: Normal flow voids. Skull and upper cervical spine: Normal marrow signal. Hyperostosis frontalis interna. Sinuses/Orbits: Negative. Other: Bilateral intra-ocular lens replacement. MRA HEAD FINDINGS Internal carotid arteries: Patent. Mild non stenotic lumen irregularity compatible with atherosclerosis. Anterior cerebral arteries:  Patent. Middle cerebral arteries: Patent. Anterior communicating artery: Patent. Posterior communicating arteries: Not identified, likely hypoplastic or absent. Posterior cerebral arteries:  Patent. Basilar artery:  Patent. Vertebral arteries:  Patent. No evidence of high-grade stenosis, large vessel occlusion, or aneurysm unless noted above. IMPRESSION: MRI head: 1. No acute intracranial abnormality identified. 2. Stable advanced chronic microvascular ischemic changes and moderate volume loss of the brain. Multiple small chronic infarctions are present in bifrontal periventricular white matter, right frontal cortex, left occipital cortex, and bilateral cerebellar hemispheres. MRA head: Patent anterior and posterior  intracranial circulation. No large vessel occlusion, aneurysm, or significant stenosis is identified. Electronically Signed   By: Kristine Garbe M.D.   On: 09/28/2018 22:54   Mr Jodene Nam Head/brain ZW Cm  Result Date: 09/28/2018 CLINICAL DATA:  85 y/o  F; speech difficulty. EXAM: MRI HEAD WITHOUT CONTRAST MRA HEAD WITHOUT CONTRAST TECHNIQUE: Multiplanar, multiecho pulse sequences of the brain and surrounding structures were obtained without intravenous contrast. Angiographic images of the head were obtained using MRA technique without contrast. COMPARISON:  09/28/2018 CT head and CTA head. FINDINGS: MRI HEAD FINDINGS Brain: No acute infarction, hemorrhage, hydrocephalus, extra-axial collection or mass lesion. Partially empty sella turcica. Large confluent nonspecific T2 FLAIR hyperintensities in subcortical and periventricular white matter are compatible with advanced chronic microvascular ischemic changes. Moderate volume loss of the brain. Small chronic infarcts are present in bifrontal periventricular white matter. Small cortical infarcts are present in the right superior frontal lobe and the left occipital lobe. Very small chronic infarcts are present within the inferior cerebellar hemispheres bilateral as well as the bilateral lentiform nuclei. There is hemosiderin staining of the lentiform nuclei infarcts. Vascular: Normal flow voids. Skull and upper cervical spine: Normal marrow signal. Hyperostosis frontalis interna. Sinuses/Orbits: Negative. Other: Bilateral intra-ocular lens replacement. MRA HEAD FINDINGS Internal carotid arteries: Patent. Mild non stenotic lumen irregularity compatible with atherosclerosis. Anterior cerebral arteries:  Patent. Middle cerebral arteries: Patent. Anterior communicating artery: Patent. Posterior communicating arteries: Not identified, likely hypoplastic or absent. Posterior cerebral arteries:  Patent. Basilar artery:  Patent. Vertebral arteries:  Patent. No evidence  of high-grade stenosis, large vessel occlusion, or aneurysm unless noted above. IMPRESSION: MRI head: 1. No acute intracranial abnormality identified. 2. Stable advanced chronic microvascular ischemic changes and moderate volume loss of the brain. Multiple small chronic infarctions are  present in bifrontal periventricular white matter, right frontal cortex, left occipital cortex, and bilateral cerebellar hemispheres. MRA head: Patent anterior and posterior intracranial circulation. No large vessel occlusion, aneurysm, or significant stenosis is identified. Electronically Signed   By: Kristine Garbe M.D.   On: 09/28/2018 22:54   ASSESSMENT AND PLAN:   Aeliana Spates Websteris an 85 y.o.femalewith past medical history significant forCAD,CML, CKD, bilateral carotid disease status post right carotid stent, hypertension, and anxiety, who presented to the hospital with acute onset of slurred speech and expressive aphasia. In the emergency room patient was not a candidate for IVTPA, SBP > 200. CTA showed no large vessel occlusion. Pt was admitted for stroke work up and hypertension urgency management.  #TIA - Neuro c/s, appreciate recs and evaluation below: - MRI brain reviewed, no acute changes.  - Hypertensive urgency needs better control of BP - Continue ASA and Lipitor for stroke prophylaxis  - Stable thrombocytopenia, will monitor - PT/OT and Speech evaluation: OT recs for SNF  - Echocardiogram: complete and pending read - fasting lipid panel, TSH, hemoglobin A1c: all within normal limits - Patient has passed bedside swallow evaluation  #Hypertensive urgency Will allow permissive hypertension but for uncontrolled blood pressure will provide IV Lopressor as needed-for systolic blood pressure greater than 170  #Hypothyroidism continue Synthyroid  #Hypokalemia repleted and up to 4.0 today, and recheck in a.m.  #H/o CML-follow-up with primary care physician/oncology for  surveillance  #History of bilateral carotid artery disease status post right carotid stent  #Chronic hyperlipidemia continue statin and check lipid panel in a.m: lipid panel within normal limits   All the records are reviewed and case is discussed with Care Management/Social Worker. Management plans discussed with the patient and/or family and they are in agreement.  CODE STATUS: Full Code  TOTAL TIME TAKING CARE OF THIS PATIENT: 30 minutes.   More than 50% of the time was spent in counseling/coordination of care: YES  POSSIBLE D/C IN 1 DAYS, DEPENDING ON CLINICAL CONDITION.   Ripley Fraise PA-C on 09/29/2018 at 2:31 PM  Between 7am to 6pm - Pager - [phone]  After 6 pm go to [email or web address] - password EPAS [facility]  [facility]  Office  [phone]  CC: Primary care physician; [name], MD  Note: This dictation was prepared with Dragon dictation along with smaller phrase technology. Any transcriptional errors that result from this process are unintentional.

## 2018-09-29 NOTE — Progress Notes (Signed)
OT Cancellation Note  Patient Details Name: Stephanie Harris MRN: [id number] DOB: 1931/08/09   Cancelled Treatment:    Reason Eval/Treat Not Completed: Patient declined, no reason specified. Consult received, chart reviewed. When therapist introduced herself, pt stated, "I don't need any of that until after I finish breakfast." Will re-attempt at later time as pt is willing and medically appropriate to participate.   Jeni Salles, MPH, MS, OTR/L ascom [phone] 09/29/18, 9:13 AM

## 2018-09-30 DIAGNOSIS — R4701 Aphasia: Secondary | ICD-10-CM | POA: Diagnosis not present

## 2018-09-30 DIAGNOSIS — E876 Hypokalemia: Secondary | ICD-10-CM | POA: Diagnosis not present

## 2018-09-30 DIAGNOSIS — G459 Transient cerebral ischemic attack, unspecified: Secondary | ICD-10-CM | POA: Diagnosis not present

## 2018-09-30 DIAGNOSIS — R479 Unspecified speech disturbances: Secondary | ICD-10-CM | POA: Diagnosis not present

## 2018-09-30 DIAGNOSIS — E039 Hypothyroidism, unspecified: Secondary | ICD-10-CM | POA: Diagnosis not present

## 2018-09-30 DIAGNOSIS — I160 Hypertensive urgency: Secondary | ICD-10-CM | POA: Diagnosis not present

## 2018-09-30 LAB — BASIC METABOLIC PANEL
Anion gap: 7 (ref 5–15)
BUN: 21 mg/dL (ref 8–23)
CO2: 27 mmol/L (ref 22–32)
Calcium: 9.7 mg/dL (ref 8.9–10.3)
Chloride: 105 mmol/L (ref 98–111)
Creatinine, Ser: 1.27 mg/dL — ABNORMAL HIGH (ref 0.44–1.00)
GFR calc Af Amer: 45 mL/min — ABNORMAL LOW (ref >60)
GFR calc non Af Amer: 38 mL/min — ABNORMAL LOW (ref >60)
Glucose, Bld: 101 mg/dL — ABNORMAL HIGH (ref 70–99)
Potassium: 3.6 mmol/L (ref 3.5–5.1)
Sodium: 139 mmol/L (ref 135–145)

## 2018-09-30 LAB — CBC
HCT: 35.5 % — ABNORMAL LOW (ref 36.0–46.0)
Hemoglobin: 10.4 g/dL — ABNORMAL LOW (ref 12.0–15.0)
MCH: 18.2 pg — ABNORMAL LOW (ref 26.0–34.0)
MCHC: 29.3 g/dL — ABNORMAL LOW (ref 30.0–36.0)
MCV: 62.2 fL — ABNORMAL LOW (ref 80.0–100.0)
Platelets: 96 10*3/uL — ABNORMAL LOW (ref 150–400)
RBC: 5.71 MIL/uL — ABNORMAL HIGH (ref 3.87–5.11)
RDW: 17.2 % — ABNORMAL HIGH (ref 11.5–15.5)
WBC: 6.7 10*3/uL (ref 4.0–10.5)
nRBC: 0 % (ref 0.0–0.2)

## 2018-09-30 LAB — ECHOCARDIOGRAM COMPLETE: Weight: 1865.97 oz

## 2018-09-30 MED ORDER — AMLODIPINE BESYLATE 5 MG PO TABS
5.0000 mg | ORAL_TABLET | Freq: Every day | ORAL | Status: DC
  Administered 2018-09-30 – 2018-10-04 (×5): 5 mg via ORAL
  Filled 2018-09-30 (×5): qty 1

## 2018-09-30 NOTE — Plan of Care (Signed)
  Problem: Education: Goal: Knowledge of disease or condition will improve Outcome: Progressing Goal: Knowledge of secondary prevention will improve Outcome: Progressing   

## 2018-09-30 NOTE — NC FL2 (Signed)
[place] LEVEL OF CARE SCREENING TOOL     IDENTIFICATION  Patient Name: Stephanie Harris Birthdate: [birth year] Sex: female Admission Date (Current Location): 09/28/2018  [place] and [place] Number:  Selena Lesser [id number] [place] and Address:  [facility], [address], [place], [place] [address]      Provider Number: [id number]  Attending Physician Name and Address:  Dustin Flock, MD  Relative Name and Phone Number:  Aleah, Ahlgrim [phone] or [name],[name] Daughter   [phone] or [name] Daughter   [phone]     Current Level of Care: Hospital Recommended Level of Care: [place] Prior Approval Number:    Date Approved/Denied:   PASRR Number: [id number]  Discharge Plan: SNF    Current Diagnoses: Patient Active Problem List   Diagnosis Date Noted  . Hypoglycemia 08/31/2018  . Nausea 08/31/2018  . B12 deficiency 08/31/2018  . Mini stroke ([place]) 08/30/2018  . Acquired hypothyroidism 07/11/2018  . Dyslipidemia 07/11/2018  . Chronic renal disease, stage III ([place]) 07/11/2018  . Sacral fracture, closed ([place]) 07/11/2018  . Traumatic hematoma of buttock 07/11/2018  . Uses walker 07/07/2018  . Frequent falls 07/02/2018  . Sacral fracture, closed ([place]) 07/01/2018  . Traumatic hematoma of buttock 07/01/2018  . At risk for domestic violence 05/06/2018  . Pain in right foot 04/06/2018  . Cerebrovascular accident (CVA) ([place]) 04/05/2018  . Essential hypertension, benign 04/05/2018  . Carotid artery calcification, bilateral 04/05/2018  . Occlusion and stenosis of both vertebral arteries   . Stenosis of carotid artery   . Hypertensive emergency   . S/P kyphoplasty 12/16/2017  . Age-related osteoporosis with current pathological fracture 11/10/2017  . Protein-calorie malnutrition, severe 11/08/2017  . Lumbar compression fracture ([place]) 11/05/2017  . Spondylosis of lumbar region without myelopathy or  radiculopathy 10/08/2017  . Chronic midline low back pain 08/29/2017  . Closed wedge compression fracture of first lumbar vertebra with delayed healing 08/29/2017  . Anemia complicating neoplastic disease 06/02/2017  . Left-sided nontraumatic intracerebral hemorrhage ([place]) 03/23/2017  . Primary localized osteoarthrosis of shoulder, left 12/31/2016  . Primary osteoarthritis of one knee, left 12/31/2016  . Closed fracture of proximal end of left humerus with routine healing 08/28/2016  . Gross hematuria 06/25/2016  . Incomplete emptying of bladder 06/25/2016  . Moderate mitral insufficiency 10/31/2015  . Syncope and collapse 10/31/2015  . Continuous leakage of urine 08/30/2015  . Frequent UTI 08/30/2015  . Muscle weakness of lower extremity 03/20/2015  . Chest pain 03/14/2015  . TIA (transient ischemic attack) 01/29/2015  . Allergic arthritis, hand   . Adhesive capsulitis of left shoulder 12/13/2014  . SI joint arthritis 07/05/2014  . Mixed hyperlipidemia 05/22/2014  . Temporary cerebral vascular dysfunction 05/22/2014  . Irritable bowel syndrome with constipation 05/03/2014  . Dysphagia 05/03/2014  . Weight loss 05/03/2014  . Bilateral carotid artery disease ([place]) 04/18/2014  . Carotid atherosclerosis 04/18/2014  . Cerebral infarction, unspecified ([place]) 04/18/2014  . Cerebral artery occlusion with cerebral infarction ([place]) 01/11/2014  . Coronary atherosclerosis 01/11/2014  . Closed fracture of lumbar vertebra ([place]) 12/19/2013  . Non-traumatic compression fracture of second lumbar vertebra ([place]) 12/19/2013  . Fall in home 05/19/2013  . Compression fracture of T12 vertebra ([place]) 04/27/2013  . Closed fracture of dorsal (thoracic) vertebra ([place]) 04/27/2013  . 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 Anemia due to stage 3 chronic kidney disease ([place]) 06/15/2012  . Lymphoid leukemia ([place]) 06/15/2012  . Unspecified visual loss 05/20/2012  .  Degenerative drusen  05/06/2012  . Drusen, retina 05/06/2012    Orientation RESPIRATION BLADDER Height & Weight     Time, Place, Situation, Self  Normal Continent Weight: 116 lb 10 oz (52.9 kg) Height:     BEHAVIORAL SYMPTOMS/MOOD NEUROLOGICAL BOWEL NUTRITION STATUS      Continent Diet  AMBULATORY STATUS COMMUNICATION OF NEEDS Skin   Limited Assist Verbally Normal                       Personal Care Assistance Level of Assistance  Bathing, Feeding, Dressing Bathing Assistance: Limited assistance Feeding assistance: Limited assistance Dressing Assistance: Limited assistance     Functional Limitations Info  Sight, Hearing, Speech Sight Info: Adequate Hearing Info: Adequate Speech Info: Adequate    SPECIAL CARE FACTORS FREQUENCY  PT (By licensed PT), OT (By licensed OT)     PT Frequency: 5x a week OT Frequency: 5x a week            Contractures Contractures Info: Not present    Additional Factors Info  Code Status, Allergies Code Status Info: Full Code Allergies Info: LATEX, MEPERIDINE, PENICILLINS, CEFUROXIME AXETIL, CODEINE, PROPOXYPHENE            Current Medications (09/30/2018):  This is the current hospital active medication list Current Facility-Administered Medications  Medication Dose Route Frequency Provider Last Rate Last Dose  . amLODipine (NORVASC) tablet 5 mg  5 mg Oral Daily Lule, Joana, PA   5 mg at 09/30/18 1014  . aspirin suppository 300 mg  300 mg Rectal Daily Gouru, Aruna, MD       Or  . aspirin tablet 325 mg  325 mg Oral Daily Gouru, Aruna, MD   325 mg at 09/30/18 1014  . atorvastatin (LIPITOR) tablet 80 mg  80 mg Oral q1800 Gouru, Aruna, MD   80 mg at 09/29/18 1650  . enoxaparin (LOVENOX) injection 30 mg  30 mg Subcutaneous Q24H Gouru, Aruna, MD   30 mg at 09/29/18 2044  . levothyroxine (SYNTHROID, LEVOTHROID) tablet 50 mcg  50 mcg Oral QAC breakfast Gouru, Aruna, MD   50 mcg at 09/30/18 0516  . linaclotide (LINZESS) capsule 145 mcg  145 mcg Oral QAC  breakfast [name], MD   145 mcg at 09/29/18 0752  . metoprolol tartrate (LOPRESSOR) injection 5 mg  5 mg Intravenous Q4H PRN Gouru, Aruna, MD   5 mg at 09/29/18 0848  . metoprolol tartrate (LOPRESSOR) tablet 25 mg  25 mg Oral BID Gouru, Aruna, MD   25 mg at 09/30/18 1014  . ondansetron (ZOFRAN-ODT) disintegrating tablet 4 mg  4 mg Oral Q8H PRN Gouru, Aruna, MD   4 mg at 09/29/18 0828  . oxyCODONE (Oxy IR/ROXICODONE) immediate release tablet 2.5-5 mg  2.5-5 mg Oral Q6H PRN [name], MD         Discharge Medications: Please see discharge summary for a list of discharge medications.  Relevant Imaging Results:  Relevant Lab Results:   Additional Information [id number]  Ross Ludwig, LCSWA

## 2018-09-30 NOTE — Care Management (Signed)
E-mailed [place] with patient accounting of insurance listed on face sheet being incorrect.  Patient has [facility] Medicare and Medicaid.

## 2018-09-30 NOTE — Care Management Obs Status (Signed)
[place] NOTIFICATION   Patient Details  Name: Stephanie Harris MRN: [id number] Date of Birth: October 27, 1930   Medicare Observation Status Notification Given:  Yes  Tablet will not capture signature  Elza Rafter, RN 09/30/2018, 5:32 PM

## 2018-09-30 NOTE — Clinical Social Work Note (Signed)
Clinical Social Work Assessment  Patient Details  Name: Stephanie Harris MRN: [id number] Date of Birth: 26-Feb-1931  Date of referral:  09/30/18               Reason for consult:  Facility Placement                Permission sought to share information with:  Facility Sport and exercise psychologist, Family Supports Permission granted to share information::  Yes, Verbal Permission Granted  Name::     Stephanie, Harris [phone] or Harris,Stephanie Daughter   [phone] or Stephanie Harris Daughter   [phone]   Agency::  SNF admissions  Relationship::     Contact Information:     Housing/Transportation Living arrangements for the past 2 months:  Single Family Home Source of Information:  Patient Patient Interpreter Needed:  None Criminal Activity/Legal Involvement Pertinent to Current Situation/Hospitalization:  No - Comment as needed Significant Relationships:  Adult Children, Spouse Lives with:  Spouse Do you feel safe going back to the place where you live?  No Need for family participation in patient care:  No (Coment)  Care giving concerns:  Patient feels that she needs some short term rehab before she is able to return back home.   Social Worker assessment / plan:  Patient is an 85 year old female who is married and lives with her husband, and son who is on disability.  Patient is alert and oriented x4, however she is not happy with her husband, she is agreeable to going back home once she receives some short term rehab.  Patient states she has been at SNF before, she was not very pleased with the experience, she is willing to try a different facility.  Patient was explained the process for looking for SNF and how insurance will pay for stay.  Patient states she also has CAPS program in place where she has some caregivers coming to her home.  Patient was hesitant about going to SNF and was originally declining, however she has reconsidered and is agreeable to going to SNF now.  CSW was  given permission to begin bed search in [place].  Patient states she would prefer [facility] if possible.  Employment status:  Retired Forensic scientist:    PT Recommendations:  [place] / Referral to community resources:  [place]  Patient/Family's Response to care:  Patient is agreeable to going to SNF for short term rehab.  Patient/Family's Understanding of and Emotional Response to Diagnosis, Current Treatment, and Prognosis:  Patient is hopeful that she will not have to be in SNF for very long.  Emotional Assessment Appearance:  Appears stated age Attitude/Demeanor/Rapport:    Affect (typically observed):  Appropriate, Accepting, Stable Orientation:  Oriented to Self, Oriented to Place, Oriented to  Time, Oriented to Situation Alcohol / Substance use:  Not Applicable Psych involvement (Current and /or in the community):  No (Comment)  Discharge Needs  Concerns to be addressed:  Lack of Support, Care Coordination Readmission within the last 30 days:  No Current discharge risk:  Lack of support system Barriers to Discharge:  Continued Medical Work up, [employer]   Stephanie Harris 09/30/2018, 6:43 PM

## 2018-09-30 NOTE — Clinical Social Work Note (Signed)
CSW was informed that patient wants SNF placement now.  CSW will begin bed search in [facility].  Jones Broom. [place], MSW, [place]  09/30/2018 12:56 PM

## 2018-09-30 NOTE — Care Management Note (Signed)
Case Management Note  Patient Details  Name: Stephanie Harris MRN: [id number] Date of Birth: Apr 15, 1931  Subjective/Objective:     Patient is from home with husband and son.  PT has recommended SNF.  Patient at first stated she wanted to go home with home health because her son has a broken hip and she felt like she needs to take care of him.  After speaking patient; she said she does feel it would be best to discharge to STR.  States she wants [facility].  Notified Randall Hiss, [place]. He will discuss options with patient as this is a ALF.  She denies difficulties obtaining medications or with accessing medical care.  She uses a walker at home.  Current with her PCP.  Uses [facility].  She is currently open to the CAPS program and receives extra help in the home through [facility].  She states she has Medicare and Medicaid though the face sheet lists [facility] HMO.  Will notify patient accounting.  Patient just finished a 12 week PT program with [facility]. If patient ends up going home with [facility], referral will be made to [facility] as that is the patients choice.  [email or web address]. list provided.    She also receives Meals on Wheels.  Will continue to follow and assist with discharge disposition.              Action/Plan:   Expected Discharge Date:  09/30/18               Expected Discharge Plan:     In-House Referral:     Discharge planning Services     Post Acute Care Choice:    Choice offered to:     DME Arranged:    DME Agency:     HH Arranged:    HH Agency:     Status of Service:     If discussed at [employer], dates discussed:    Additional Comments:  Elza Rafter, RN 09/30/2018, 1:01 PM

## 2018-09-30 NOTE — Progress Notes (Signed)
Brief History Sophiya AMYRIE ILLINGWORTH is an 85 y.o. female with past medical history significant for CAD, CML with thrombocytopenia, bilateral carotid disease status post right carotid stent, hypertension, and anxiety, chronic kidney disease who presented to the hospital with acute onset of difficulty speaking, In the emergency room patient systolic blood pressure was over 200 with low platelets count for that reason IVTPA was not given, CTA showed no LVO. Pt was admitted for stroke work up and hypertension urgency management.   Subjective Feels better, complaining about the noises in the hospital.   Past Medical History Past Medical History:  Diagnosis Date  . Acute bronchiolitis   . Acute pharyngitis   . Allergic rhinitis due to pollen   . Anemia, unspecified    CKD and LGL  . Anxiety state    unspecified  . Arthritis    "knees, legs" (03/18/2018)  . Asthma without status asthmaticus    unspecified  . B12 deficiency   . Beta thalassemia trait   . Carotid artery occlusion   . Cellulitis and abscess of leg, except foot   . Cervical spondylosis   . Cervicalgia   . Chronic kidney disease   . Chronic lower back pain   . CML (chronic myelocytic leukemia) ([place])    Onc at [facility]  . Complication of anesthesia    "last back surgery they liked to never get me awake" (03/18/2018)  . Coronary artery disease 2009   heart attack with stent  . Coronary atherosclerosis of native coronary artery   . Depressive disorder    not elsewhere classified  . Difficulty in walking   . Disorder of breast, unspecified   . Dizziness and giddiness   . Dysuria   . Esophageal reflux   . Essential hypertension    unspecified  . Family history of adverse reaction to anesthesia    "liked to never get my sister awake" (03/18/2018)  . Head injury    unspecified  . Headache    "q time I have a stroke" (03/18/2018)  . Heart disease   . Hematuria, unspecified   . History of blood transfusion    "had 22 when they  found out I had leukemia" (03/18/2018)  . Hypersomnia, unspecified   . Hypertension   . Hypopotassemia   . Hypothyroidism   . ICH (intracerebral hemorrhage) ([place])   . Ill-defined cerebrovascular disease    other  . Insomnia    unspecified  . 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 Leukemia ([place])   . Lower urinary tract infection   . Lumbago   . Mini stroke ([place])    x years  . Mixed hyperlipidemia   . Myocardial infarction ([place]) ~2017  . Nontoxic nodular goiter    unspecified  . Occlusion and stenosis of unspecified carotid artery    without mention of cerebral infarction  . Osteoarthritis   . Osteoporosis   . Other constipation   . Other vitamin B12 deficiency anemias   . Otitis media, unspecified, unspecified ear   . Ovarian failure    unspecified  . Pain in limb   . Panic disorder without agoraphobia   . Peripheral vascular disease ([place])    unspecified  . Phlebitis of left arm   . RA (rheumatoid arthritis) ([place])   . Sigmoid polyp 1998  . Sleep disturbance    unspecified  . Stroke ([place]) 02/2017   "lots of mini strokes; big one 02/2017; that one made me weak in my knees,; never  fully recovered" (03/18/2018)  . Syncope and collapse   . Thalassemia   . TIA (transient ischemic attack)    2000 and 2008 right carotid stent 08/14/2009 on Plavix  . TIA (transient ischemic attack) 03/18/2018  . Transient disorder of initiating or maintaining sleep   . Type II diabetes mellitus ([place])   . Varicose veins of bilateral lower extremities with other complications   . Varicose veins of bilateral lower extremities with other complications   . Vision abnormalities     Past Surgical History Past Surgical History:  Procedure Laterality Date  . BACK SURGERY    . CAROTID ENDARTERECTOMY Left   . CAROTID STENT INSERTION Right    "have 2 stents in there" (03/18/2018)  . CATARACT EXTRACTION W/ INTRAOCULAR LENS  IMPLANT, BILATERAL Bilateral 06/16/2001 - 05/28/2003   +23.5D      22.5D  . CHOLECYSTECTOMY OPEN  1980  . COLONOSCOPY  2010  . CORONARY ANGIOPLASTY WITH STENT PLACEMENT    . EYELID SURGERY Bilateral 2005   BUL BLEPH  . FRACTURE SURGERY    . JOINT REPLACEMENT    . KYPHOPLASTY N/A 08/12/2017   Procedure: KYPHOPLASTY;  Surgeon: Hessie Knows, MD;  Location: [facility] ORS;  Service: Orthopedics;  Laterality: N/A;  . KYPHOPLASTY N/A 11/08/2017   Procedure: Hewitt Shorts;  Surgeon: Hessie Knows, MD;  Location: [facility] ORS;  Service: Orthopedics;  Laterality: N/A;  . PERCUTANEOUS PLACEMENT INTRAVASCULAR STENT CERVICAL CAROTID ARTERY  06/14/2009  . ROTATOR CUFF REPAIR Left   . TOTAL ABDOMINAL HYSTERECTOMY  1978   WITH REMOVAL TUBES & /OR OVARIES  . TOTAL KNEE ARTHROPLASTY Right     Allergies Allergies  Allergen Reactions  . Latex Rash  . Meperidine     Other reaction(s): Other (See Comments) Other Reaction: CNS Disorder  . Penicillins Other (See Comments)    Has patient had a PCN reaction causing immediate rash, facial/tongue/throat swelling, SOB or lightheadedness with hypotension: Unknown Has patient had a PCN reaction causing severe rash involving mucus membranes or skin necrosis: Unknown Has patient had a PCN reaction that required hospitalization: Unknown Has patient had a PCN reaction occurring within the last 10 years: Unknown If all of the above answers are "NO", then may proceed with Cephalosporin use.   . Cefuroxime Axetil Nausea And Vomiting  . Codeine Nausea And Vomiting and Nausea Only  . Propoxyphene Nausea Only    Other reaction(s): Vomiting    Home Medications Medications Prior to Admission  Medication Sig Dispense Refill  . acetaminophen (TYLENOL) 500 MG tablet Take 1,000 mg by mouth every 8 (eight) hours.    Marland Kitchen atorvastatin (LIPITOR) 20 MG tablet Take 1 tablet (20 mg total) by mouth daily at 6 PM. 30 tablet 0  . levothyroxine (SYNTHROID, LEVOTHROID) 50 MCG tablet Take 1 tablet (50 mcg total) by mouth daily. 30 tablet 0  . linaclotide  (LINZESS) 145 MCG CAPS capsule Take 1 capsule (145 mcg total) by mouth daily before breakfast. 30 capsule 3  . ondansetron (ZOFRAN ODT) 4 MG disintegrating tablet Take 1 tablet (4 mg total) by mouth every 8 (eight) hours as needed for nausea or vomiting. 10 tablet 0  . oxyCODONE (OXY IR/ROXICODONE) 5 MG immediate release tablet Take 2.5-5 mg by mouth every 6 (six) hours as needed for moderate pain or severe pain.      Hospital Medications . amLODipine  5 mg Oral Daily  . aspirin  300 mg Rectal Daily   Or  . aspirin  325 mg Oral Daily  .  atorvastatin  80 mg Oral q1800  . enoxaparin (LOVENOX) injection  30 mg Subcutaneous Q24H  . levothyroxine  50 mcg Oral QAC breakfast  . linaclotide  145 mcg Oral QAC breakfast  . metoprolol tartrate  25 mg Oral BID       Intake/Output from previous day: 02/06 0701 - 02/07 0700 In: 721.2 [P.O.:720; I.V.:1.2] Out: -  Intake/Output this shift: Total I/O In: 240 [P.O.:240] Out: -  Nutritional status:  Diet Order            Diet Heart Room service appropriate? Yes; Fluid consistency: Thin  Diet effective now               Physical Exam  Vitals:   09/29/18 1942 09/30/18 0459 09/30/18 0808 09/30/18 1014  BP: (!) 148/52 (!) 151/53 (!) 175/66   Pulse: 61 65 64 67  Resp: 17 17    Temp: 97.7 F (36.5 C) 98.7 F (37.1 C) 98.3 F (36.8 C)   TempSrc: Oral Oral Oral   SpO2: 99% 99% 100%   Weight:       General - NAD Heart - Regular rate and rhythm - no murmer Lungs - Clear to auscultation Abdomen - Soft - non tender Extremities - Distal pulses intact - no edema Skin - Warm and dry  Neurologic Exam:   Mental Status: Alert, oriented to self, place and time, follows commands with speech intact, no aphasia   Visual fields grossly normal, pupils equal, round, reactive to light, smile symmetric, facial light touch intact, midline tongue extension Motor: RUE - 4/5                                            LUE - 4/5   RLE - 4/5                                  LLE - 4/5   normal sensory exam .   LABORATORY RESULTS:  Basic Metabolic Panel: Recent Labs  Lab 09/28/18 1103 09/29/18 0807 09/30/18 0533  NA 139 138 139  K 3.1* 4.0 3.6  CL 105 105 105  CO2 28 26 27   GLUCOSE 108* 96 101*  BUN 19 17 21   CREATININE 1.10* 1.07* 1.27*  CALCIUM 9.6 9.7 9.7    Liver Function Tests: Recent Labs  Lab 09/28/18 1103  AST 20  ALT 13  ALKPHOS 26*  BILITOT 2.0*  PROT 7.2  ALBUMIN 4.2   No results for input(s): LIPASE, AMYLASE in the last 168 hours. No results for input(s): AMMONIA in the last 168 hours.  CBC: Recent Labs  Lab 09/28/18 1103 09/29/18 0807 09/30/18 0533  WBC 4.3 6.7 6.7  NEUTROABS 1.0*  --   --   HGB 10.4* 10.6* 10.4*  HCT 34.9* 35.5* 35.5*  MCV 62.7* 62.8* 62.2*  PLT 85* 89* 96*    Cardiac Enzymes: Recent Labs  Lab 09/28/18 1103  TROPONINI <0.03    Lipid Panel: Recent Labs  Lab 09/29/18 0458  CHOL 130  TRIG 38  HDL 48  CHOLHDL 2.7  VLDL 8  LDLCALC 74    CBG: Recent Labs  Lab 09/28/18 1125  GLUCAP 106*    Microbiology:   Coagulation Studies: Recent Labs    09/28/18 1103  LABPROT 14.0  INR 1.09  Miscellaneous Labs:   IMAGING RESULTS Ct Angio Head W Or Wo Contrast  Result Date: 09/28/2018 CLINICAL DATA:  Follow-up code stroke. Slurred speech. Mental status changes. EXAM: CT ANGIOGRAPHY HEAD AND NECK TECHNIQUE: Multidetector CT imaging of the head and neck was performed using the standard protocol during bolus administration of intravenous contrast. Multiplanar CT image reconstructions and MIPs were obtained to evaluate the vascular anatomy. Carotid stenosis measurements (when applicable) are obtained utilizing NASCET criteria, using the distal internal carotid diameter as the denominator. CONTRAST:  35mL OMNIPAQUE IOHEXOL 350 MG/ML SOLN COMPARISON:  Head CT same day.  Multiple previous studies 2019. FINDINGS: CTA NECK FINDINGS Aortic arch: The aorta shows  atherosclerotic change but no aneurysm or dissection. Branching pattern is normal. Right carotid system: Common carotid artery is tortuous but widely patent to the bifurcation. There is a carotid stent from the distal common carotid artery into the internal carotid artery. The stent appears patent without evidence of significant stenosis. There may be mild intimal thickening in the midportion. Beyond the stent, the internal carotid artery is widely patent to the skull base. Left carotid system: Common carotid artery is tortuous but widely patent to the bifurcation. There is calcified plaque at the carotid bifurcation and ICA bulb. Minimal diameter in the ICA bulb measures 3.5 mm. Compared to a more distal cervical ICA diameter of 4 mm, this represents only a 10-20% stenosis. Cervical ICA widely patent beyond that. Vertebral arteries: Calcified plaque at the left vertebral artery origin with stenosis of 50%. Beyond the origin, the vessel is widely patent to the foramen magnum. Right vertebral artery origin is widely patent. There is focal calcified plaque just distal to the origin with stenosis estimated at 75%. Beyond that, the vessel is widely patent through the cervical region to the foramen magnum. Skeleton: Ordinary spondylosis. Other neck: No mass or lymphadenopathy. Upper chest: Mild pulmonary scarring and dependent atelectasis. Review of the MIP images confirms the above findings CTA HEAD FINDINGS Anterior circulation: Both internal carotid arteries are widely patent through the skull base and siphon regions. There is ordinary siphon atherosclerotic calcification but no stenosis greater than 20%. The anterior and middle cerebral vessels are patent without large or medium vessel occlusion. No correctable proximal stenosis. Posterior circulation: Both vertebral arteries are patent through the foramen magnum to the basilar. No basilar stenosis. Posterior circulation branch vessels appear patent. There is mild  distal vessel atherosclerotic irregularity. Venous sinuses: Patent and normal. Anatomic variants: None significant. Delayed phase: No abnormal enhancement. Review of the MIP images confirms the above findings IMPRESSION: Right carotid stent is patent without significant stenosis. There is probably mild intimal thickening in the midportion of the stent. Atherosclerotic disease at the left carotid bifurcation. Narrowing in the ICA bulb is only on the order of 10-20% when compared to the diameter of the more distal cervical ICA. 50% stenosis of the left vertebral artery origin, with wide patency beyond that. 75% stenosis just distal to the right vertebral artery origin with wide patency beyond that. No intracranial large or medium vessel occlusion or correctable proximal stenosis. Electronically Signed   By: Nelson Chimes M.D.   On: 09/28/2018 12:38   Ct Angio Neck W And/or Wo Contrast  Result Date: 09/28/2018 CLINICAL DATA:  Follow-up code stroke. Slurred speech. Mental status changes. EXAM: CT ANGIOGRAPHY HEAD AND NECK TECHNIQUE: Multidetector CT imaging of the head and neck was performed using the standard protocol during bolus administration of intravenous contrast. Multiplanar CT image reconstructions and MIPs were  obtained to evaluate the vascular anatomy. Carotid stenosis measurements (when applicable) are obtained utilizing NASCET criteria, using the distal internal carotid diameter as the denominator. CONTRAST:  21mL OMNIPAQUE IOHEXOL 350 MG/ML SOLN COMPARISON:  Head CT same day.  Multiple previous studies 2019. FINDINGS: CTA NECK FINDINGS Aortic arch: The aorta shows atherosclerotic change but no aneurysm or dissection. Branching pattern is normal. Right carotid system: Common carotid artery is tortuous but widely patent to the bifurcation. There is a carotid stent from the distal common carotid artery into the internal carotid artery. The stent appears patent without evidence of significant stenosis. There  may be mild intimal thickening in the midportion. Beyond the stent, the internal carotid artery is widely patent to the skull base. Left carotid system: Common carotid artery is tortuous but widely patent to the bifurcation. There is calcified plaque at the carotid bifurcation and ICA bulb. Minimal diameter in the ICA bulb measures 3.5 mm. Compared to a more distal cervical ICA diameter of 4 mm, this represents only a 10-20% stenosis. Cervical ICA widely patent beyond that. Vertebral arteries: Calcified plaque at the left vertebral artery origin with stenosis of 50%. Beyond the origin, the vessel is widely patent to the foramen magnum. Right vertebral artery origin is widely patent. There is focal calcified plaque just distal to the origin with stenosis estimated at 75%. Beyond that, the vessel is widely patent through the cervical region to the foramen magnum. Skeleton: Ordinary spondylosis. Other neck: No mass or lymphadenopathy. Upper chest: Mild pulmonary scarring and dependent atelectasis. Review of the MIP images confirms the above findings CTA HEAD FINDINGS Anterior circulation: Both internal carotid arteries are widely patent through the skull base and siphon regions. There is ordinary siphon atherosclerotic calcification but no stenosis greater than 20%. The anterior and middle cerebral vessels are patent without large or medium vessel occlusion. No correctable proximal stenosis. Posterior circulation: Both vertebral arteries are patent through the foramen magnum to the basilar. No basilar stenosis. Posterior circulation branch vessels appear patent. There is mild distal vessel atherosclerotic irregularity. Venous sinuses: Patent and normal. Anatomic variants: None significant. Delayed phase: No abnormal enhancement. Review of the MIP images confirms the above findings IMPRESSION: Right carotid stent is patent without significant stenosis. There is probably mild intimal thickening in the midportion of the  stent. Atherosclerotic disease at the left carotid bifurcation. Narrowing in the ICA bulb is only on the order of 10-20% when compared to the diameter of the more distal cervical ICA. 50% stenosis of the left vertebral artery origin, with wide patency beyond that. 75% stenosis just distal to the right vertebral artery origin with wide patency beyond that. No intracranial large or medium vessel occlusion or correctable proximal stenosis. Electronically Signed   By: Nelson Chimes M.D.   On: 09/28/2018 12:38   Mr Brain Wo Contrast  Result Date: 09/28/2018 CLINICAL DATA:  86 y/o  F; speech difficulty. EXAM: MRI HEAD WITHOUT CONTRAST MRA HEAD WITHOUT CONTRAST TECHNIQUE: Multiplanar, multiecho pulse sequences of the brain and surrounding structures were obtained without intravenous contrast. Angiographic images of the head were obtained using MRA technique without contrast. COMPARISON:  09/28/2018 CT head and CTA head. FINDINGS: MRI HEAD FINDINGS Brain: No acute infarction, hemorrhage, hydrocephalus, extra-axial collection or mass lesion. Partially empty sella turcica. Large confluent nonspecific T2 FLAIR hyperintensities in subcortical and periventricular white matter are compatible with advanced chronic microvascular ischemic changes. Moderate volume loss of the brain. Small chronic infarcts are present in bifrontal periventricular white matter. Small  cortical infarcts are present in the right superior frontal lobe and the left occipital lobe. Very small chronic infarcts are present within the inferior cerebellar hemispheres bilateral as well as the bilateral lentiform nuclei. There is hemosiderin staining of the lentiform nuclei infarcts. Vascular: Normal flow voids. Skull and upper cervical spine: Normal marrow signal. Hyperostosis frontalis interna. Sinuses/Orbits: Negative. Other: Bilateral intra-ocular lens replacement. MRA HEAD FINDINGS Internal carotid arteries: Patent. Mild non stenotic lumen irregularity  compatible with atherosclerosis. Anterior cerebral arteries:  Patent. Middle cerebral arteries: Patent. Anterior communicating artery: Patent. Posterior communicating arteries: Not identified, likely hypoplastic or absent. Posterior cerebral arteries:  Patent. Basilar artery:  Patent. Vertebral arteries:  Patent. No evidence of high-grade stenosis, large vessel occlusion, or aneurysm unless noted above. IMPRESSION: MRI head: 1. No acute intracranial abnormality identified. 2. Stable advanced chronic microvascular ischemic changes and moderate volume loss of the brain. Multiple small chronic infarctions are present in bifrontal periventricular white matter, right frontal cortex, left occipital cortex, and bilateral cerebellar hemispheres. MRA head: Patent anterior and posterior intracranial circulation. No large vessel occlusion, aneurysm, or significant stenosis is identified. Electronically Signed   By: Kristine Garbe M.D.   On: 09/28/2018 22:54   Mr Jodene Nam Head/brain [place] Cm  Result Date: 09/28/2018 CLINICAL DATA:  85 y/o  F; speech difficulty. EXAM: MRI HEAD WITHOUT CONTRAST MRA HEAD WITHOUT CONTRAST TECHNIQUE: Multiplanar, multiecho pulse sequences of the brain and surrounding structures were obtained without intravenous contrast. Angiographic images of the head were obtained using MRA technique without contrast. COMPARISON:  09/28/2018 CT head and CTA head. FINDINGS: MRI HEAD FINDINGS Brain: No acute infarction, hemorrhage, hydrocephalus, extra-axial collection or mass lesion. Partially empty sella turcica. Large confluent nonspecific T2 FLAIR hyperintensities in subcortical and periventricular white matter are compatible with advanced chronic microvascular ischemic changes. Moderate volume loss of the brain. Small chronic infarcts are present in bifrontal periventricular white matter. Small cortical infarcts are present in the right superior frontal lobe and the left occipital lobe. Very small chronic  infarcts are present within the inferior cerebellar hemispheres bilateral as well as the bilateral lentiform nuclei. There is hemosiderin staining of the lentiform nuclei infarcts. Vascular: Normal flow voids. Skull and upper cervical spine: Normal marrow signal. Hyperostosis frontalis interna. Sinuses/Orbits: Negative. Other: Bilateral intra-ocular lens replacement. MRA HEAD FINDINGS Internal carotid arteries: Patent. Mild non stenotic lumen irregularity compatible with atherosclerosis. Anterior cerebral arteries:  Patent. Middle cerebral arteries: Patent. Anterior communicating artery: Patent. Posterior communicating arteries: Not identified, likely hypoplastic or absent. Posterior cerebral arteries:  Patent. Basilar artery:  Patent. Vertebral arteries:  Patent. No evidence of high-grade stenosis, large vessel occlusion, or aneurysm unless noted above. IMPRESSION: MRI head: 1. No acute intracranial abnormality identified. 2. Stable advanced chronic microvascular ischemic changes and moderate volume loss of the brain. Multiple small chronic infarctions are present in bifrontal periventricular white matter, right frontal cortex, left occipital cortex, and bilateral cerebellar hemispheres. MRA head: Patent anterior and posterior intracranial circulation. No large vessel occlusion, aneurysm, or significant stenosis is identified. Electronically Signed   By: Kristine Garbe M.D.   On: 09/28/2018 22:54       Assessment/Plan:  85 y.o. female with past medical history significant for CAD, CML with thrombocytopenia, bilateral carotid disease status post right carotid stent, hypertension  who presented to the hospital with acute onset of difficulty speaking in the setting of elevated BP, pt was not given IVTPA due to thrombocytopenia and an elevated SBP>200.  -MRI brain reviewed, no acute changes.  -Hypertensive  urgency needs better control of BP -Continue ASA and Lipitor for stroke prophylaxis  -PT/OT  and speech evaluation -Plan discussed in details with the patient.   Arnaldo Natal, MD

## 2018-09-30 NOTE — Progress Notes (Addendum)
[place] at [place] NAME: Stephanie Harris    MR#:  [id number]  DATE OF BIRTH:  1931-07-22  SUBJECTIVE:  CHIEF COMPLAINT:   Chief Complaint  Patient presents with  . slurred speech   Laying in bed eating breakfast when seen. No complaints. Feels her speech is back to baseline. Has been working with PT/OT and would like more rehab per their recs. Her son whom she lives with currently has a hip fracture and her husband is unable to help with caretaker duties. Patient states she would like to go to [facility] (an ALF) however recommendation is for SNF. She would consider home with [facility] PT/OT if necessary.   Spoke with daughter Jan on the phone today and she is on board with this plan. Her phone number: [phone].  REVIEW OF SYSTEMS:  Review of Systems  Constitutional: Negative for chills, diaphoresis, fever and malaise/fatigue.  HENT: Negative for congestion and sore throat.   Eyes: Negative for blurred vision and double vision.  Respiratory: Negative for cough, shortness of breath and wheezing.   Cardiovascular: Negative for chest pain, palpitations and leg swelling.  Gastrointestinal: Negative for abdominal pain, heartburn, nausea and vomiting.  Genitourinary: Negative for dysuria.  Musculoskeletal: Negative for myalgias.  Skin: Negative for itching and rash.  Neurological: Negative for dizziness, tingling, tremors, sensory change, speech change, focal weakness, loss of consciousness, weakness and headaches.  Endo/Heme/Allergies: Does not bruise/bleed easily.  Psychiatric/Behavioral: Negative for depression. The patient is not nervous/anxious.     DRUG ALLERGIES:   Allergies  Allergen Reactions  . Latex Rash  . Meperidine     Other reaction(s): Other (See Comments) Other Reaction: CNS Disorder  . Penicillins Other (See Comments)    Has patient had a PCN reaction causing immediate rash, facial/tongue/throat swelling, SOB or  lightheadedness with hypotension: Unknown Has patient had a PCN reaction causing severe rash involving mucus membranes or skin necrosis: Unknown Has patient had a PCN reaction that required hospitalization: Unknown Has patient had a PCN reaction occurring within the last 10 years: Unknown If all of the above answers are "NO", then may proceed with Cephalosporin use.   . Cefuroxime Axetil Nausea And Vomiting  . Codeine Nausea And Vomiting and Nausea Only  . Propoxyphene Nausea Only    Other reaction(s): Vomiting   VITALS:  Blood pressure (!) 148/61, pulse (!) 49, temperature 98.3 F (36.8 C), temperature source Oral, resp. rate 17, weight 52.9 kg, SpO2 100 %. PHYSICAL EXAMINATION:  GENERAL:Frail 85 y.o.-year-old patient lying in the bed with no acute distress.  EYES: Pupils equal, round, reactive to light and accommodation. No scleral icterus. Extraocular muscles intact.  HEENT: Head atraumatic, normocephalic. Oropharynx and nasopharynx clear.  NECK: Supple, no jugular venous distention.  LUNGS: Normal breath sounds bilaterally, no wheezing, rales, rhonchi or crepitation. No use of accessory muscles of respiration.  CARDIOVASCULAR: S1, S2 normal. No murmurs, rubs, or gallops.  ABDOMEN: Soft, nontender, nondistended. Bowel sounds present. No organomegaly or mass.  EXTREMITIES: No pedal edema, cyanosis, or clubbing.  NEUROLOGIC:Awake, alert and oriented x3. Muscle strength3/5 in all extremities. Sensation intact. Gait not checked.No expressive aphasia noted during exam. PSYCHIATRIC: The patient is alert and oriented x 3.  SKIN: No obvious rash, lesion, or ulcer.  LABORATORY PANEL:  Female CBC Recent Labs  Lab 09/30/18 0533  WBC 6.7  HGB 10.4*  HCT 35.5*  PLT 96*   ------------------------------------------------------------------------------------------------------------------ Chemistries  Recent Labs  Lab 09/28/18 1103  09/30/18 0533  NA 139   < > 139  K 3.1*   < > 3.6    CL 105   < > 105  CO2 28   < > 27  GLUCOSE 108*   < > 101*  BUN 19   < > 21  CREATININE 1.10*   < > 1.27*  CALCIUM 9.6   < > 9.7  AST 20  --   --   ALT 13  --   --   ALKPHOS 26*  --   --   BILITOT 2.0*  --   --    < > = values in this interval not displayed.   RADIOLOGY:  Mr Brain 27 Contrast  Result Date: 09/28/2018 CLINICAL DATA:  85 y/o  F; speech difficulty. EXAM: MRI HEAD WITHOUT CONTRAST MRA HEAD WITHOUT CONTRAST TECHNIQUE: Multiplanar, multiecho pulse sequences of the brain and surrounding structures were obtained without intravenous contrast. Angiographic images of the head were obtained using MRA technique without contrast. COMPARISON:  09/28/2018 CT head and CTA head. FINDINGS: MRI HEAD FINDINGS Brain: No acute infarction, hemorrhage, hydrocephalus, extra-axial collection or mass lesion. Partially empty sella turcica. Large confluent nonspecific T2 FLAIR hyperintensities in subcortical and periventricular white matter are compatible with advanced chronic microvascular ischemic changes. Moderate volume loss of the brain. Small chronic infarcts are present in bifrontal periventricular white matter. Small cortical infarcts are present in the right superior frontal lobe and the left occipital lobe. Very small chronic infarcts are present within the inferior cerebellar hemispheres bilateral as well as the bilateral lentiform nuclei. There is hemosiderin staining of the lentiform nuclei infarcts. Vascular: Normal flow voids. Skull and upper cervical spine: Normal marrow signal. Hyperostosis frontalis interna. Sinuses/Orbits: Negative. Other: Bilateral intra-ocular lens replacement. MRA HEAD FINDINGS Internal carotid arteries: Patent. Mild non stenotic lumen irregularity compatible with atherosclerosis. Anterior cerebral arteries:  Patent. Middle cerebral arteries: Patent. Anterior communicating artery: Patent. Posterior communicating arteries: Not identified, likely hypoplastic or absent.  Posterior cerebral arteries:  Patent. Basilar artery:  Patent. Vertebral arteries:  Patent. No evidence of high-grade stenosis, large vessel occlusion, or aneurysm unless noted above. IMPRESSION: MRI head: 1. No acute intracranial abnormality identified. 2. Stable advanced chronic microvascular ischemic changes and moderate volume loss of the brain. Multiple small chronic infarctions are present in bifrontal periventricular white matter, right frontal cortex, left occipital cortex, and bilateral cerebellar hemispheres. MRA head: Patent anterior and posterior intracranial circulation. No large vessel occlusion, aneurysm, or significant stenosis is identified. Electronically Signed   By: Kristine Garbe M.D.   On: 09/28/2018 22:54   Mr Jodene Nam Head/brain IW Cm  Result Date: 09/28/2018 CLINICAL DATA:  85 y/o  F; speech difficulty. EXAM: MRI HEAD WITHOUT CONTRAST MRA HEAD WITHOUT CONTRAST TECHNIQUE: Multiplanar, multiecho pulse sequences of the brain and surrounding structures were obtained without intravenous contrast. Angiographic images of the head were obtained using MRA technique without contrast. COMPARISON:  09/28/2018 CT head and CTA head. FINDINGS: MRI HEAD FINDINGS Brain: No acute infarction, hemorrhage, hydrocephalus, extra-axial collection or mass lesion. Partially empty sella turcica. Large confluent nonspecific T2 FLAIR hyperintensities in subcortical and periventricular white matter are compatible with advanced chronic microvascular ischemic changes. Moderate volume loss of the brain. Small chronic infarcts are present in bifrontal periventricular white matter. Small cortical infarcts are present in the right superior frontal lobe and the left occipital lobe. Very small chronic infarcts are present within the inferior cerebellar hemispheres bilateral as well as the bilateral lentiform nuclei. There is hemosiderin  staining of the lentiform nuclei infarcts. Vascular: Normal flow voids. Skull and  upper cervical spine: Normal marrow signal. Hyperostosis frontalis interna. Sinuses/Orbits: Negative. Other: Bilateral intra-ocular lens replacement. MRA HEAD FINDINGS Internal carotid arteries: Patent. Mild non stenotic lumen irregularity compatible with atherosclerosis. Anterior cerebral arteries:  Patent. Middle cerebral arteries: Patent. Anterior communicating artery: Patent. Posterior communicating arteries: Not identified, likely hypoplastic or absent. Posterior cerebral arteries:  Patent. Basilar artery:  Patent. Vertebral arteries:  Patent. No evidence of high-grade stenosis, large vessel occlusion, or aneurysm unless noted above. IMPRESSION: MRI head: 1. No acute intracranial abnormality identified. 2. Stable advanced chronic microvascular ischemic changes and moderate volume loss of the brain. Multiple small chronic infarctions are present in bifrontal periventricular white matter, right frontal cortex, left occipital cortex, and bilateral cerebellar hemispheres. MRA head: Patent anterior and posterior intracranial circulation. No large vessel occlusion, aneurysm, or significant stenosis is identified. Electronically Signed   By: Kristine Garbe M.D.   On: 09/28/2018 22:54   ASSESSMENT AND PLAN:   Jannette Cotham Websteris an 85 y.o.femalewith past medical history significant forCAD,CML, CKD, bilateral carotid disease status post right carotid stent, hypertension, and anxiety, who presented to the hospital with acute onset of slurred speech and expressive aphasia.In the emergency room patient was not a candidate for IVTPA, SBP > 200. CTA showed no large vessel occlusion. Pt was admitted for stroke work up and hypertension urgency management. Negative stroke work-up, hypertension improving.  #TIA - Neuro c/s, appreciate recs and evaluation below: - MRI brain reviewed, no acute changes.  - Hypertensive urgency needs better control of BP; amlodipine added today, BP improving - Continue ASA  and Lipitor for stroke prophylaxis  - Stable thrombocytopenia, will monitor  - PT/OT and Speech evaluation: OT recs for SNF, PT recs for SNF, SP signs off but recommends OP voice treatment/OP ENT at discharge - CM and [place] working on SNF placement vs coordinating [place] services  - Echocardiogram complete: pending - fasting lipid panel, TSH, hemoglobin A1c: all within normal limits - Patient has passed bedside swallow evaluation  #Hypertensive urgency Improving, amlodipine and metoprolol scheduled. Monitor for symptomatic bradycardia and hold metoprolol as needed.  Consider addition of lisinopril 2.5 mg for renal protection tomorrow, per pharmacy recs if blood pressure allows.  #Hypothyroidism continue Synthyroid  #Hypokalemia repleted and stable. Will monitor  #H/o CML-follow-up with primary care physician/oncology for surveillance  #History of bilateral carotid artery disease status post right carotid stent  #Chronic hyperlipidemia continue statin and check lipid panel in a.m: lipid panel within normal limits  All the records are reviewed and case is discussed with Care Management/Social Worker. Management plans discussed with the patient and/or family and they are in agreement.  CODE STATUS: Full Code  TOTAL TIME TAKING CARE OF THIS PATIENT: 30 minutes.   More than 50% of the time was spent in counseling/coordination of care: YES  POSSIBLE D/C IN 1 DAYS, DEPENDING ON CLINICAL CONDITION AND PLACEMENT.   Ripley Fraise PA-C on 09/30/2018 at 12:31 PM  Between 7am to 6pm - Pager - [phone]  After 6 pm go to [email or web address] - password [email or web address] [facility]  Office  [phone]  CC: Primary care physician; [name], MD

## 2018-09-30 NOTE — Progress Notes (Signed)
Ch received a verbal prayer request for pt from nursing staff. Pt had an upbeat affect while her "friend nurse" was present. 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 Pt shared that her husband no longer wants her to get treatments but pt shared that she has done so for the last 15 yrs. Ch allowed pt time to expressed her emotions related to her marriage challenges. Visited ended abruptly to allow care management to visit w/ pt. F/u recommended.     09/30/18 1000  Clinical Encounter Type  Visited With Patient  Visit Type Psychological support;Spiritual support;Social support  Referral From Nurse;Care management  Consult/Referral To Chaplain  Spiritual Encounters  Spiritual Needs Emotional;Grief support;Prayer  Stress Factors  Patient Stress Factors Exhausted;Family relationships;Health changes;Major life changes  Family Stress Factors None identified

## 2018-10-01 DIAGNOSIS — E039 Hypothyroidism, unspecified: Secondary | ICD-10-CM | POA: Diagnosis not present

## 2018-10-01 DIAGNOSIS — I160 Hypertensive urgency: Secondary | ICD-10-CM | POA: Diagnosis not present

## 2018-10-01 DIAGNOSIS — G459 Transient cerebral ischemic attack, unspecified: Secondary | ICD-10-CM | POA: Diagnosis not present

## 2018-10-01 DIAGNOSIS — E876 Hypokalemia: Secondary | ICD-10-CM | POA: Diagnosis not present

## 2018-10-01 LAB — BASIC METABOLIC PANEL
Anion gap: 6 (ref 5–15)
BUN: 21 mg/dL (ref 8–23)
CO2: 28 mmol/L (ref 22–32)
Calcium: 9.7 mg/dL (ref 8.9–10.3)
Chloride: 105 mmol/L (ref 98–111)
Creatinine, Ser: 1.08 mg/dL — ABNORMAL HIGH (ref 0.44–1.00)
GFR calc Af Amer: 54 mL/min — ABNORMAL LOW (ref >60)
GFR calc non Af Amer: 47 mL/min — ABNORMAL LOW (ref >60)
Glucose, Bld: 96 mg/dL (ref 70–99)
Potassium: 3.6 mmol/L (ref 3.5–5.1)
Sodium: 139 mmol/L (ref 135–145)

## 2018-10-01 LAB — CBC
HCT: 35.1 % — ABNORMAL LOW (ref 36.0–46.0)
Hemoglobin: 10.4 g/dL — ABNORMAL LOW (ref 12.0–15.0)
MCH: 18.7 pg — ABNORMAL LOW (ref 26.0–34.0)
MCHC: 29.6 g/dL — ABNORMAL LOW (ref 30.0–36.0)
MCV: 63 fL — ABNORMAL LOW (ref 80.0–100.0)
Platelets: 107 10*3/uL — ABNORMAL LOW (ref 150–400)
RBC: 5.57 MIL/uL — ABNORMAL HIGH (ref 3.87–5.11)
RDW: 17.3 % — ABNORMAL HIGH (ref 11.5–15.5)
WBC: 6.4 10*3/uL (ref 4.0–10.5)
nRBC: 0 % (ref 0.0–0.2)

## 2018-10-01 MED ORDER — SODIUM CHLORIDE 0.9% FLUSH
3.0000 mL | Freq: Two times a day (BID) | INTRAVENOUS | Status: DC
  Administered 2018-10-01 – 2018-10-04 (×5): 3 mL via INTRAVENOUS

## 2018-10-01 MED ORDER — LISINOPRIL 5 MG PO TABS
2.5000 mg | ORAL_TABLET | Freq: Every day | ORAL | Status: DC
  Administered 2018-10-01 – 2018-10-04 (×4): 2.5 mg via ORAL
  Filled 2018-10-01 (×4): qty 1

## 2018-10-01 MED ORDER — SODIUM CHLORIDE 0.9% FLUSH: 3.0000 mL | INTRAVENOUS | Status: DC | PRN

## 2018-10-01 MED ORDER — SODIUM CHLORIDE 0.9% FLUSH
3.0000 mL | Freq: Two times a day (BID) | INTRAVENOUS | Status: DC
  Administered 2018-10-01 – 2018-10-04 (×6): 3 mL via INTRAVENOUS

## 2018-10-01 NOTE — Plan of Care (Signed)
  Problem: Education: Goal: Knowledge of disease or condition will improve Outcome: Progressing Goal: Knowledge of secondary prevention will improve Outcome: Progressing Goal: Knowledge of patient specific risk factors addressed and post discharge goals established will improve Outcome: Progressing   

## 2018-10-01 NOTE — Progress Notes (Signed)
[place] at [place] NAME: Stephanie Harris    MR#:  [id number]  DATE OF BIRTH:  1931/05/05  SUBJECTIVE:  CHIEF COMPLAINT:   Chief Complaint  Patient presents with  . slurred speech   Patient complained she was complaining of weakness  REVIEW OF SYSTEMS:  Review of Systems  Constitutional: Negative for chills, diaphoresis, fever and malaise/fatigue.  HENT: Negative for congestion and sore throat.   Eyes: Negative for blurred vision and double vision.  Respiratory: Negative for cough, shortness of breath and wheezing.   Cardiovascular: Negative for chest pain, palpitations and leg swelling.  Gastrointestinal: Negative for abdominal pain, heartburn, nausea and vomiting.  Genitourinary: Negative for dysuria.  Musculoskeletal: Negative for myalgias.  Skin: Negative for itching and rash.  Neurological: Negative for dizziness, tingling, tremors, sensory change, speech change, focal weakness, loss of consciousness, weakness and headaches.  Endo/Heme/Allergies: Does not bruise/bleed easily.  Psychiatric/Behavioral: Negative for depression. The patient is not nervous/anxious.     DRUG ALLERGIES:   Allergies  Allergen Reactions  . Latex Rash  . Meperidine     Other reaction(s): Other (See Comments) Other Reaction: CNS Disorder  . Penicillins Other (See Comments)    Has patient had a PCN reaction causing immediate rash, facial/tongue/throat swelling, SOB or lightheadedness with hypotension: Unknown Has patient had a PCN reaction causing severe rash involving mucus membranes or skin necrosis: Unknown Has patient had a PCN reaction that required hospitalization: Unknown Has patient had a PCN reaction occurring within the last 10 years: Unknown If all of the above answers are "NO", then may proceed with Cephalosporin use.   . Cefuroxime Axetil Nausea And Vomiting  . Codeine Nausea And Vomiting and Nausea Only  . Propoxyphene Nausea Only   Other reaction(s): Vomiting   VITALS:  Blood pressure (!) 155/72, pulse 62, temperature 97.7 F (36.5 C), temperature source Oral, resp. rate 20, weight 43.2 kg, SpO2 98 %. PHYSICAL EXAMINATION:  GENERAL:Frail 85 y.o.-year-old patient lying in the bed with no acute distress.  EYES: Pupils equal, round, reactive to light and accommodation. No scleral icterus. Extraocular muscles intact.  HEENT: Head atraumatic, normocephalic. Oropharynx and nasopharynx clear.  NECK: Supple, no jugular venous distention.  LUNGS: Normal breath sounds bilaterally, no wheezing, rales, rhonchi or crepitation. No use of accessory muscles of respiration.  CARDIOVASCULAR: S1, S2 normal. No murmurs, rubs, or gallops.  ABDOMEN: Soft, nontender, nondistended. Bowel sounds present. No organomegaly or mass.  EXTREMITIES: No pedal edema, cyanosis, or clubbing.  NEUROLOGIC:Awake, alert and oriented x3. Muscle strength3/5 in all extremities. Sensation intact. Gait not checked.No expressive aphasia noted during exam. PSYCHIATRIC: The patient is alert and oriented x 3.  SKIN: No obvious rash, lesion, or ulcer.  LABORATORY PANEL:  Female CBC Recent Labs  Lab 10/01/18 0419  WBC 6.4  HGB 10.4*  HCT 35.1*  PLT 107*   ------------------------------------------------------------------------------------------------------------------ Chemistries  Recent Labs  Lab 09/28/18 1103  10/01/18 0419  NA 139   < > 139  K 3.1*   < > 3.6  CL 105   < > 105  CO2 28   < > 28  GLUCOSE 108*   < > 96  BUN 19   < > 21  CREATININE 1.10*   < > 1.08*  CALCIUM 9.6   < > 9.7  AST 20  --   --   ALT 13  --   --   ALKPHOS 26*  --   --  BILITOT 2.0*  --   --    < > = values in this interval not displayed.   RADIOLOGY:  Mr Brain 15 Contrast  Result Date: 09/28/2018 CLINICAL DATA:  85 y/o  F; speech difficulty. EXAM: MRI HEAD WITHOUT CONTRAST MRA HEAD WITHOUT CONTRAST TECHNIQUE: Multiplanar, multiecho pulse sequences of the brain  and surrounding structures were obtained without intravenous contrast. Angiographic images of the head were obtained using MRA technique without contrast. COMPARISON:  09/28/2018 CT head and CTA head. FINDINGS: MRI HEAD FINDINGS Brain: No acute infarction, hemorrhage, hydrocephalus, extra-axial collection or mass lesion. Partially empty sella turcica. Large confluent nonspecific T2 FLAIR hyperintensities in subcortical and periventricular white matter are compatible with advanced chronic microvascular ischemic changes. Moderate volume loss of the brain. Small chronic infarcts are present in bifrontal periventricular white matter. Small cortical infarcts are present in the right superior frontal lobe and the left occipital lobe. Very small chronic infarcts are present within the inferior cerebellar hemispheres bilateral as well as the bilateral lentiform nuclei. There is hemosiderin staining of the lentiform nuclei infarcts. Vascular: Normal flow voids. Skull and upper cervical spine: Normal marrow signal. Hyperostosis frontalis interna. Sinuses/Orbits: Negative. Other: Bilateral intra-ocular lens replacement. MRA HEAD FINDINGS Internal carotid arteries: Patent. Mild non stenotic lumen irregularity compatible with atherosclerosis. Anterior cerebral arteries:  Patent. Middle cerebral arteries: Patent. Anterior communicating artery: Patent. Posterior communicating arteries: Not identified, likely hypoplastic or absent. Posterior cerebral arteries:  Patent. Basilar artery:  Patent. Vertebral arteries:  Patent. No evidence of high-grade stenosis, large vessel occlusion, or aneurysm unless noted above. IMPRESSION: MRI head: 1. No acute intracranial abnormality identified. 2. Stable advanced chronic microvascular ischemic changes and moderate volume loss of the brain. Multiple small chronic infarctions are present in bifrontal periventricular white matter, right frontal cortex, left occipital cortex, and bilateral  cerebellar hemispheres. MRA head: Patent anterior and posterior intracranial circulation. No large vessel occlusion, aneurysm, or significant stenosis is identified. Electronically Signed   By: Kristine Garbe M.D.   On: 09/28/2018 22:54   Mr Jodene Nam Head/brain QA Cm  Result Date: 09/28/2018 CLINICAL DATA:  85 y/o  F; speech difficulty. EXAM: MRI HEAD WITHOUT CONTRAST MRA HEAD WITHOUT CONTRAST TECHNIQUE: Multiplanar, multiecho pulse sequences of the brain and surrounding structures were obtained without intravenous contrast. Angiographic images of the head were obtained using MRA technique without contrast. COMPARISON:  09/28/2018 CT head and CTA head. FINDINGS: MRI HEAD FINDINGS Brain: No acute infarction, hemorrhage, hydrocephalus, extra-axial collection or mass lesion. Partially empty sella turcica. Large confluent nonspecific T2 FLAIR hyperintensities in subcortical and periventricular white matter are compatible with advanced chronic microvascular ischemic changes. Moderate volume loss of the brain. Small chronic infarcts are present in bifrontal periventricular white matter. Small cortical infarcts are present in the right superior frontal lobe and the left occipital lobe. Very small chronic infarcts are present within the inferior cerebellar hemispheres bilateral as well as the bilateral lentiform nuclei. There is hemosiderin staining of the lentiform nuclei infarcts. Vascular: Normal flow voids. Skull and upper cervical spine: Normal marrow signal. Hyperostosis frontalis interna. Sinuses/Orbits: Negative. Other: Bilateral intra-ocular lens replacement. MRA HEAD FINDINGS Internal carotid arteries: Patent. Mild non stenotic lumen irregularity compatible with atherosclerosis. Anterior cerebral arteries:  Patent. Middle cerebral arteries: Patent. Anterior communicating artery: Patent. Posterior communicating arteries: Not identified, likely hypoplastic or absent. Posterior cerebral arteries:  Patent.  Basilar artery:  Patent. Vertebral arteries:  Patent. No evidence of high-grade stenosis, large vessel occlusion, or aneurysm unless noted above. IMPRESSION: MRI head:  1. No acute intracranial abnormality identified. 2. Stable advanced chronic microvascular ischemic changes and moderate volume loss of the brain. Multiple small chronic infarctions are present in bifrontal periventricular white matter, right frontal cortex, left occipital cortex, and bilateral cerebellar hemispheres. MRA head: Patent anterior and posterior intracranial circulation. No large vessel occlusion, aneurysm, or significant stenosis is identified. Electronically Signed   By: Kristine Garbe M.D.   On: 09/28/2018 22:54   ASSESSMENT AND PLAN:   Stephanie Shawgo Websteris an 25 y.o.femalewith past medical history significant forCAD,CML, CKD, bilateral carotid disease status post right carotid stent, hypertension, and anxiety, who presented to the hospital with acute onset of slurred speech and expressive aphasia.In the emergency room patient was not a candidate for IVTPA, SBP > 200. CTA showed no large vessel occlusion. Pt was admitted for stroke work up and hypertension urgency management. Negative stroke work-up, hypertension improving.  #TIA - Neuro c/s, appreciate recs and evaluation below: - MRI brain reviewed, no acute changes.  - Hypertensive urgency blood pressure improved - Continue ASA and Lipitor for stroke prophylaxis  - Stable thrombocytopenia, will monitor  - PT/OT and Speech evaluation: OT recs for SNF, PT recs for SNF, SP signs off but recommends OP voice treatment/OP ENT at discharge -Waiting discharge to rehab - Echocardiogram complete: pending - fasting lipid panel, TSH, hemoglobin A1c: all within normal limits - Patient has passed bedside swallow evaluation  #Hypertensive urgency Blood pressure much improved Due to diabetes add ACE inhibitor low-dose   #Hypothyroidism continue  Synthyroid  #Hypokalemia repleted and stable. Will monitor  #H/o CML-follow-up with primary care physician/oncology for surveillance  #History of bilateral carotid artery disease status post right carotid stent  #Chronic hyperlipidemia continue statin and check lipid panel in a.m: lipid panel within normal limits  All the records are reviewed and case is discussed with Care Management/Social Worker. Management plans discussed with the patient and/or family and they are in agreement.  CODE STATUS: Full Code  TOTAL TIME TAKING CARE OF THIS PATIENT: 30 minutes.   More than 50% of the time was spent in counseling/coordination of care: YES  POSSIBLE D/C IN 1 DAYS, DEPENDING ON CLINICAL CONDITION AND PLACEMENT.   Crystallee Werden PA-C on 10/01/2018 at 2:25 PM  Between 7am to 6pm - Pager - [phone]  After 6 pm go to [email or web address] - password [email or web address] [facility]  Office  [phone]  CC: Primary care physician; [name], MD

## 2018-10-02 DIAGNOSIS — E876 Hypokalemia: Secondary | ICD-10-CM | POA: Diagnosis not present

## 2018-10-02 DIAGNOSIS — E039 Hypothyroidism, unspecified: Secondary | ICD-10-CM | POA: Diagnosis not present

## 2018-10-02 DIAGNOSIS — G459 Transient cerebral ischemic attack, unspecified: Secondary | ICD-10-CM | POA: Diagnosis not present

## 2018-10-02 DIAGNOSIS — I160 Hypertensive urgency: Secondary | ICD-10-CM | POA: Diagnosis not present

## 2018-10-02 LAB — RETICULOCYTES
Immature Retic Fract: 24.4 % — ABNORMAL HIGH (ref 2.3–15.9)
RBC.: 5.12 MIL/uL — ABNORMAL HIGH (ref 3.87–5.11)
Retic Count, Absolute: 153.6 10*3/uL (ref 19.0–186.0)
Retic Ct Pct: 3 % (ref 0.4–3.1)

## 2018-10-02 LAB — IRON AND TIBC
Iron: 32 ug/dL (ref 28–170)
Saturation Ratios: 13 % (ref 10.4–31.8)
TIBC: 241 ug/dL — ABNORMAL LOW (ref 250–450)
UIBC: 209 ug/dL

## 2018-10-02 LAB — FERRITIN: Ferritin: 475 ng/mL — ABNORMAL HIGH (ref 11–307)

## 2018-10-02 LAB — FOLATE: Folate: 9.8 ng/mL (ref >5.9)

## 2018-10-02 MED ORDER — ACETAMINOPHEN 325 MG PO TABS
650.0000 mg | ORAL_TABLET | Freq: Four times a day (QID) | ORAL | Status: DC | PRN
  Administered 2018-10-02: 650 mg via ORAL
  Filled 2018-10-02: qty 2

## 2018-10-02 NOTE — Progress Notes (Signed)
[facility] NAME: Stephanie Harris    MR#:  [id number]  DATE OF BIRTH:  February 24, 1931  SUBJECTIVE:  CHIEF COMPLAINT:   Chief Complaint  Patient presents with  . slurred speech   Patient continues to complain of weakness and some neck pain  REVIEW OF SYSTEMS:  Review of Systems  Constitutional: Negative for chills, diaphoresis, fever and malaise/fatigue.  HENT: Negative for congestion and sore throat.   Eyes: Negative for blurred vision and double vision.  Respiratory: Negative for cough, shortness of breath and wheezing.   Cardiovascular: Negative for chest pain, palpitations and leg swelling.  Gastrointestinal: Negative for abdominal pain, heartburn, nausea and vomiting.  Genitourinary: Negative for dysuria.  Musculoskeletal: Negative for myalgias.  Skin: Negative for itching and rash.  Neurological: Negative for dizziness, tingling, tremors, sensory change, speech change, focal weakness, loss of consciousness, weakness and headaches.  Endo/Heme/Allergies: Does not bruise/bleed easily.  Psychiatric/Behavioral: Negative for depression. The patient is not nervous/anxious.     DRUG ALLERGIES:   Allergies  Allergen Reactions  . Latex Rash  . Meperidine     Other reaction(s): Other (See Comments) Other Reaction: CNS Disorder  . Penicillins Other (See Comments)    Has patient had a PCN reaction causing immediate rash, facial/tongue/throat swelling, SOB or lightheadedness with hypotension: Unknown Has patient had a PCN reaction causing severe rash involving mucus membranes or skin necrosis: Unknown Has patient had a PCN reaction that required hospitalization: Unknown Has patient had a PCN reaction occurring within the last 10 years: Unknown If all of the above answers are "NO", then may proceed with Cephalosporin use.   . Cefuroxime Axetil Nausea And Vomiting  . Codeine Nausea And Vomiting and Nausea Only  . Propoxyphene Nausea  Only    Other reaction(s): Vomiting   VITALS:  Blood pressure (!) 126/56, pulse 61, temperature (!) 97.4 F (36.3 C), temperature source Oral, resp. rate 18, weight 42.4 kg, SpO2 100 %. PHYSICAL EXAMINATION:  GENERAL:Frail 85 y.o.-year-old patient lying in the bed with no acute distress.  EYES: Pupils equal, round, reactive to light and accommodation. No scleral icterus. Extraocular muscles intact.  HEENT: Head atraumatic, normocephalic. Oropharynx and nasopharynx clear.  NECK: Supple, no jugular venous distention.  LUNGS: Normal breath sounds bilaterally, no wheezing, rales, rhonchi or crepitation. No use of accessory muscles of respiration.  CARDIOVASCULAR: S1, S2 normal. No murmurs, rubs, or gallops.  ABDOMEN: Soft, nontender, nondistended. Bowel sounds present. No organomegaly or mass.  EXTREMITIES: No pedal edema, cyanosis, or clubbing.  NEUROLOGIC:Awake, alert and oriented x3. Muscle strength3/5 in all extremities. Sensation intact. Gait not checked.No expressive aphasia noted during exam. PSYCHIATRIC: The patient is alert and oriented x 3.  SKIN: No obvious rash, lesion, or ulcer.  LABORATORY PANEL:  Female CBC Recent Labs  Lab 10/01/18 0419  WBC 6.4  HGB 10.4*  HCT 35.1*  PLT 107*   ------------------------------------------------------------------------------------------------------------------ Chemistries  Recent Labs  Lab 09/28/18 1103  10/01/18 0419  NA 139   < > 139  K 3.1*   < > 3.6  CL 105   < > 105  CO2 28   < > 28  GLUCOSE 108*   < > 96  BUN 19   < > 21  CREATININE 1.10*   < > 1.08*  CALCIUM 9.6   < > 9.7  AST 20  --   --   ALT 13  --   --   ALKPHOS 26*  --   --  BILITOT 2.0*  --   --    < > = values in this interval not displayed.   RADIOLOGY:  Mr Brain 18 Contrast  Result Date: 09/28/2018 CLINICAL DATA:  85 y/o  F; speech difficulty. EXAM: MRI HEAD WITHOUT CONTRAST MRA HEAD WITHOUT CONTRAST TECHNIQUE: Multiplanar, multiecho pulse sequences  of the brain and surrounding structures were obtained without intravenous contrast. Angiographic images of the head were obtained using MRA technique without contrast. COMPARISON:  09/28/2018 CT head and CTA head. FINDINGS: MRI HEAD FINDINGS Brain: No acute infarction, hemorrhage, hydrocephalus, extra-axial collection or mass lesion. Partially empty sella turcica. Large confluent nonspecific T2 FLAIR hyperintensities in subcortical and periventricular white matter are compatible with advanced chronic microvascular ischemic changes. Moderate volume loss of the brain. Small chronic infarcts are present in bifrontal periventricular white matter. Small cortical infarcts are present in the right superior frontal lobe and the left occipital lobe. Very small chronic infarcts are present within the inferior cerebellar hemispheres bilateral as well as the bilateral lentiform nuclei. There is hemosiderin staining of the lentiform nuclei infarcts. Vascular: Normal flow voids. Skull and upper cervical spine: Normal marrow signal. Hyperostosis frontalis interna. Sinuses/Orbits: Negative. Other: Bilateral intra-ocular lens replacement. MRA HEAD FINDINGS Internal carotid arteries: Patent. Mild non stenotic lumen irregularity compatible with atherosclerosis. Anterior cerebral arteries:  Patent. Middle cerebral arteries: Patent. Anterior communicating artery: Patent. Posterior communicating arteries: Not identified, likely hypoplastic or absent. Posterior cerebral arteries:  Patent. Basilar artery:  Patent. Vertebral arteries:  Patent. No evidence of high-grade stenosis, large vessel occlusion, or aneurysm unless noted above. IMPRESSION: MRI head: 1. No acute intracranial abnormality identified. 2. Stable advanced chronic microvascular ischemic changes and moderate volume loss of the brain. Multiple small chronic infarctions are present in bifrontal periventricular white matter, right frontal cortex, left occipital cortex, and  bilateral cerebellar hemispheres. MRA head: Patent anterior and posterior intracranial circulation. No large vessel occlusion, aneurysm, or significant stenosis is identified. Electronically Signed   By: Kristine Garbe M.D.   On: 09/28/2018 22:54   Mr Jodene Nam Head/brain XB Cm  Result Date: 09/28/2018 CLINICAL DATA:  85 y/o  F; speech difficulty. EXAM: MRI HEAD WITHOUT CONTRAST MRA HEAD WITHOUT CONTRAST TECHNIQUE: Multiplanar, multiecho pulse sequences of the brain and surrounding structures were obtained without intravenous contrast. Angiographic images of the head were obtained using MRA technique without contrast. COMPARISON:  09/28/2018 CT head and CTA head. FINDINGS: MRI HEAD FINDINGS Brain: No acute infarction, hemorrhage, hydrocephalus, extra-axial collection or mass lesion. Partially empty sella turcica. Large confluent nonspecific T2 FLAIR hyperintensities in subcortical and periventricular white matter are compatible with advanced chronic microvascular ischemic changes. Moderate volume loss of the brain. Small chronic infarcts are present in bifrontal periventricular white matter. Small cortical infarcts are present in the right superior frontal lobe and the left occipital lobe. Very small chronic infarcts are present within the inferior cerebellar hemispheres bilateral as well as the bilateral lentiform nuclei. There is hemosiderin staining of the lentiform nuclei infarcts. Vascular: Normal flow voids. Skull and upper cervical spine: Normal marrow signal. Hyperostosis frontalis interna. Sinuses/Orbits: Negative. Other: Bilateral intra-ocular lens replacement. MRA HEAD FINDINGS Internal carotid arteries: Patent. Mild non stenotic lumen irregularity compatible with atherosclerosis. Anterior cerebral arteries:  Patent. Middle cerebral arteries: Patent. Anterior communicating artery: Patent. Posterior communicating arteries: Not identified, likely hypoplastic or absent. Posterior cerebral arteries:   Patent. Basilar artery:  Patent. Vertebral arteries:  Patent. No evidence of high-grade stenosis, large vessel occlusion, or aneurysm unless noted above. IMPRESSION: MRI head:  1. No acute intracranial abnormality identified. 2. Stable advanced chronic microvascular ischemic changes and moderate volume loss of the brain. Multiple small chronic infarctions are present in bifrontal periventricular white matter, right frontal cortex, left occipital cortex, and bilateral cerebellar hemispheres. MRA head: Patent anterior and posterior intracranial circulation. No large vessel occlusion, aneurysm, or significant stenosis is identified. Electronically Signed   By: Kristine Garbe M.D.   On: 09/28/2018 22:54   ASSESSMENT AND PLAN:   [place] Deitrick Websteris an 85 y.o.femalewith past medical history significant forCAD,CML, CKD, bilateral carotid disease status post right carotid stent, hypertension, and anxiety, who presented to the hospital with acute onset of slurred speech and expressive aphasia.In the emergency room patient was not a candidate for IVTPA, SBP > 200. CTA showed no large vessel occlusion. Pt was admitted for stroke work up and hypertension urgency management. Negative stroke work-up, hypertension improving.  #TIA - Neuro c/s, appreciate recs and evaluation below: - MRI brain reviewed, no acute changes.  - Hypertensive urgency blood pressure improved - Continue ASA and Lipitor for stroke prophylaxis  - Stable thrombocytopenia, will monitor  - PT/OT and Speech evaluation: OT recs for SNF, PT recs for SNF, SP signs off but recommends OP voice treatment/OP ENT at discharge -Waiting discharge to rehab - Echocardiogram complete: pending - fasting lipid panel, TSH, hemoglobin A1c: all within normal limits - Patient has passed bedside swallow evaluation  #Hypertensive urgency Blood pressure much improved Due to diabetes add ACE inhibitor low-dose   #Hypothyroidism continue  Synthyroid  #Hypokalemia repleted and stable. Will monitor  #H/o CML-follow-up with primary care physician/oncology for surveillance  #History of bilateral carotid artery disease status post right carotid stent  #Chronic hyperlipidemia continue statin and check lipid panel in a.m: lipid panel within normal limits  #Anemia due to thalassemia according to the patient I will check her iron levels   All the records are reviewed and case is discussed with Care Management/Social Worker. Management plans discussed with the patient and/or family and they are in agreement.  CODE STATUS: Full Code  TOTAL TIME TAKING CARE OF THIS PATIENT: 30 minutes.   More than 50% of the time was spent in counseling/coordination of care: YES  POSSIBLE D/C IN 1 DAYS, DEPENDING ON CLINICAL CONDITION AND PLACEMENT.   Shelsie Tijerino PA-C on 10/02/2018 at 1:05 PM  Between 7am to 6pm - Pager - [phone]  After 6 pm go to [email or web address] - password [email or web address] [facility]  Office  [phone]  CC: Primary care physician; [name], MD

## 2018-10-02 NOTE — Plan of Care (Signed)
  Problem: Clinical Measurements: Goal: Ability to maintain clinical measurements within normal limits will improve Outcome: Not Progressing Note:  Creatinine level is elevated at 1.1. Will continue to monitor renal function. Stephanie Harris [facility]

## 2018-10-03 DIAGNOSIS — I160 Hypertensive urgency: Secondary | ICD-10-CM | POA: Diagnosis not present

## 2018-10-03 DIAGNOSIS — G459 Transient cerebral ischemic attack, unspecified: Secondary | ICD-10-CM | POA: Diagnosis not present

## 2018-10-03 DIAGNOSIS — E039 Hypothyroidism, unspecified: Secondary | ICD-10-CM | POA: Diagnosis not present

## 2018-10-03 DIAGNOSIS — E876 Hypokalemia: Secondary | ICD-10-CM | POA: Diagnosis not present

## 2018-10-03 LAB — GASTROINTESTINAL PANEL BY PCR, STOOL (REPLACES STOOL CULTURE)

## 2018-10-03 LAB — VITAMIN B12: Vitamin B-12: 751 pg/mL (ref 180–914)

## 2018-10-03 MED ORDER — AMLODIPINE BESYLATE 5 MG PO TABS: 5.0000 mg | ORAL_TABLET | Freq: Every day | ORAL | 0 refills | Status: DC

## 2018-10-03 MED ORDER — ASPIRIN EC 325 MG PO TBEC: 325.0000 mg | DELAYED_RELEASE_TABLET | Freq: Every day | ORAL | 3 refills | Status: DC

## 2018-10-03 MED ORDER — LISINOPRIL 2.5 MG PO TABS: 5.0000 mg | ORAL_TABLET | Freq: Every day | ORAL | Status: DC

## 2018-10-03 MED ORDER — ATORVASTATIN CALCIUM 40 MG PO TABS: 20.0000 mg | ORAL_TABLET | Freq: Every day | ORAL | Status: DC

## 2018-10-03 NOTE — Progress Notes (Signed)
PT Cancellation Note  Patient Details Name: DACEY MILBERGER MRN: [id number] DOB: 1931-08-11   Cancelled Treatment:    Reason Eval/Treat Not Completed: Patient declined, no reason specified;Other (comment). Treatment attempted; pt declines initially stating she can't "because I'm sick" (pointing to the contact precautions on the door). Explained/educated pt that therapies/health care providers can still work with patient and the importance of therapy. Pt becomes mildly upset stating that there has been so much going on "and now this" putting her face into her hands/tearful. Offered support/understanding. Re attempt tomorrow; hopeful pt will better be able to participate after time to process the new precautions.    Larae Grooms, PTA 10/03/2018, 2:18 PM

## 2018-10-03 NOTE — Clinical Social Work Note (Signed)
CSW spoke with patient and confirmed she would like to go to [facility], CSW spoke with [facility] and they will start insurance authorization.  CSW to continue to follow patient's progress throughout discharge planning.  Jones Broom. [place], MSW, [place]  10/03/2018 11:52 AM

## 2018-10-03 NOTE — Discharge Summary (Signed)
[place] at [facility], 85 y.o., DOB Sep 19, 1930, MRN [id number]. Admission date: 09/28/2018 Discharge Date 10/03/2018 Primary MD [name], MD Admitting Physician [name], MD  Admission Diagnosis  Expressive aphasia [R47.01]  Discharge Diagnosis   Active Problems:   TIA (transient ischemic attack) Accelerated hypertension Hypothyroidism Hypokalemia History of CML History of bilateral carotid artery disease status post right carotid stent Hyperlipidemia Anemia due to [name] an 85 y.o.femalewith past medical history significant forCAD,CML,CKD,bilateral carotid disease status post right carotid stent, hypertension,andanxiety, who presented to the hospital with acute onset ofslurred speech and expressive aphasia.In the emergency room patientwas not a candidate forIVTPA, SBP > 200.CTA showed no large vessel occlusion. Pt was admitted for stroke work up and hypertension urgency management. Negative stroke work-up, hypertension improving.  Patient underwent work-up MRI showed no evidence of stroke.  She was seen by neurology medication changes were recommended.  Patient continues to be very weak in need of rehab.          Consults  neurology  Significant Tests:  See full reports for all details     Ct Angio Head W Or Wo Contrast  Result Date: 09/28/2018 CLINICAL DATA:  Follow-up code stroke. Slurred speech. Mental status changes. EXAM: CT ANGIOGRAPHY HEAD AND NECK TECHNIQUE: Multidetector CT imaging of the head and neck was performed using the standard protocol during bolus administration of intravenous contrast. Multiplanar CT image reconstructions and MIPs were obtained to evaluate the vascular anatomy. Carotid stenosis measurements (when applicable) are obtained utilizing NASCET criteria, using the distal internal carotid diameter as the denominator. CONTRAST:  43mL OMNIPAQUE  IOHEXOL 350 MG/ML SOLN COMPARISON:  Head CT same day.  Multiple previous studies 2019. FINDINGS: CTA NECK FINDINGS Aortic arch: The aorta shows atherosclerotic change but no aneurysm or dissection. Branching pattern is normal. Right carotid system: Common carotid artery is tortuous but widely patent to the bifurcation. There is a carotid stent from the distal common carotid artery into the internal carotid artery. The stent appears patent without evidence of significant stenosis. There may be mild intimal thickening in the midportion. Beyond the stent, the internal carotid artery is widely patent to the skull base. Left carotid system: Common carotid artery is tortuous but widely patent to the bifurcation. There is calcified plaque at the carotid bifurcation and ICA bulb. Minimal diameter in the ICA bulb measures 3.5 mm. Compared to a more distal cervical ICA diameter of 4 mm, this represents only a 10-20% stenosis. Cervical ICA widely patent beyond that. Vertebral arteries: Calcified plaque at the left vertebral artery origin with stenosis of 50%. Beyond the origin, the vessel is widely patent to the foramen magnum. Right vertebral artery origin is widely patent. There is focal calcified plaque just distal to the origin with stenosis estimated at 75%. Beyond that, the vessel is widely patent through the cervical region to the foramen magnum. Skeleton: Ordinary spondylosis. Other neck: No mass or lymphadenopathy. Upper chest: Mild pulmonary scarring and dependent atelectasis. Review of the MIP images confirms the above findings CTA HEAD FINDINGS Anterior circulation: Both internal carotid arteries are widely patent through the skull base and siphon regions. There is ordinary siphon atherosclerotic calcification but no stenosis greater than 20%. The anterior and middle cerebral vessels are patent without large or medium vessel occlusion. No correctable proximal stenosis. Posterior circulation: Both vertebral arteries  are patent through the foramen magnum to the basilar. No basilar  stenosis. Posterior circulation branch vessels appear patent. There is mild distal vessel atherosclerotic irregularity. Venous sinuses: Patent and normal. Anatomic variants: None significant. Delayed phase: No abnormal enhancement. Review of the MIP images confirms the above findings IMPRESSION: Right carotid stent is patent without significant stenosis. There is probably mild intimal thickening in the midportion of the stent. Atherosclerotic disease at the left carotid bifurcation. Narrowing in the ICA bulb is only on the order of 10-20% when compared to the diameter of the more distal cervical ICA. 50% stenosis of the left vertebral artery origin, with wide patency beyond that. 75% stenosis just distal to the right vertebral artery origin with wide patency beyond that. No intracranial large or medium vessel occlusion or correctable proximal stenosis. Electronically Signed   By: Nelson Chimes M.D.   On: 09/28/2018 12:38   Ct Angio Neck W And/or Wo Contrast  Result Date: 09/28/2018 CLINICAL DATA:  Follow-up code stroke. Slurred speech. Mental status changes. EXAM: CT ANGIOGRAPHY HEAD AND NECK TECHNIQUE: Multidetector CT imaging of the head and neck was performed using the standard protocol during bolus administration of intravenous contrast. Multiplanar CT image reconstructions and MIPs were obtained to evaluate the vascular anatomy. Carotid stenosis measurements (when applicable) are obtained utilizing NASCET criteria, using the distal internal carotid diameter as the denominator. CONTRAST:  56mL OMNIPAQUE IOHEXOL 350 MG/ML SOLN COMPARISON:  Head CT same day.  Multiple previous studies 2019. FINDINGS: CTA NECK FINDINGS Aortic arch: The aorta shows atherosclerotic change but no aneurysm or dissection. Branching pattern is normal. Right carotid system: Common carotid artery is tortuous but widely patent to the bifurcation. There is a carotid stent  from the distal common carotid artery into the internal carotid artery. The stent appears patent without evidence of significant stenosis. There may be mild intimal thickening in the midportion. Beyond the stent, the internal carotid artery is widely patent to the skull base. Left carotid system: Common carotid artery is tortuous but widely patent to the bifurcation. There is calcified plaque at the carotid bifurcation and ICA bulb. Minimal diameter in the ICA bulb measures 3.5 mm. Compared to a more distal cervical ICA diameter of 4 mm, this represents only a 10-20% stenosis. Cervical ICA widely patent beyond that. Vertebral arteries: Calcified plaque at the left vertebral artery origin with stenosis of 50%. Beyond the origin, the vessel is widely patent to the foramen magnum. Right vertebral artery origin is widely patent. There is focal calcified plaque just distal to the origin with stenosis estimated at 75%. Beyond that, the vessel is widely patent through the cervical region to the foramen magnum. Skeleton: Ordinary spondylosis. Other neck: No mass or lymphadenopathy. Upper chest: Mild pulmonary scarring and dependent atelectasis. Review of the MIP images confirms the above findings CTA HEAD FINDINGS Anterior circulation: Both internal carotid arteries are widely patent through the skull base and siphon regions. There is ordinary siphon atherosclerotic calcification but no stenosis greater than 20%. The anterior and middle cerebral vessels are patent without large or medium vessel occlusion. No correctable proximal stenosis. Posterior circulation: Both vertebral arteries are patent through the foramen magnum to the basilar. No basilar stenosis. Posterior circulation branch vessels appear patent. There is mild distal vessel atherosclerotic irregularity. Venous sinuses: Patent and normal. Anatomic variants: None significant. Delayed phase: No abnormal enhancement. Review of the MIP images confirms the above  findings IMPRESSION: Right carotid stent is patent without significant stenosis. There is probably mild intimal thickening in the midportion of the stent. Atherosclerotic disease at the  left carotid bifurcation. Narrowing in the ICA bulb is only on the order of 10-20% when compared to the diameter of the more distal cervical ICA. 50% stenosis of the left vertebral artery origin, with wide patency beyond that. 75% stenosis just distal to the right vertebral artery origin with wide patency beyond that. No intracranial large or medium vessel occlusion or correctable proximal stenosis. Electronically Signed   By: Nelson Chimes M.D.   On: 09/28/2018 12:38   Mr Brain Wo Contrast  Result Date: 09/28/2018 CLINICAL DATA:  85 y/o  F; speech difficulty. EXAM: MRI HEAD WITHOUT CONTRAST MRA HEAD WITHOUT CONTRAST TECHNIQUE: Multiplanar, multiecho pulse sequences of the brain and surrounding structures were obtained without intravenous contrast. Angiographic images of the head were obtained using MRA technique without contrast. COMPARISON:  09/28/2018 CT head and CTA head. FINDINGS: MRI HEAD FINDINGS Brain: No acute infarction, hemorrhage, hydrocephalus, extra-axial collection or mass lesion. Partially empty sella turcica. Large confluent nonspecific T2 FLAIR hyperintensities in subcortical and periventricular white matter are compatible with advanced chronic microvascular ischemic changes. Moderate volume loss of the brain. Small chronic infarcts are present in bifrontal periventricular white matter. Small cortical infarcts are present in the right superior frontal lobe and the left occipital lobe. Very small chronic infarcts are present within the inferior cerebellar hemispheres bilateral as well as the bilateral lentiform nuclei. There is hemosiderin staining of the lentiform nuclei infarcts. Vascular: Normal flow voids. Skull and upper cervical spine: Normal marrow signal. Hyperostosis frontalis interna. Sinuses/Orbits:  Negative. Other: Bilateral intra-ocular lens replacement. MRA HEAD FINDINGS Internal carotid arteries: Patent. Mild non stenotic lumen irregularity compatible with atherosclerosis. Anterior cerebral arteries:  Patent. Middle cerebral arteries: Patent. Anterior communicating artery: Patent. Posterior communicating arteries: Not identified, likely hypoplastic or absent. Posterior cerebral arteries:  Patent. Basilar artery:  Patent. Vertebral arteries:  Patent. No evidence of high-grade stenosis, large vessel occlusion, or aneurysm unless noted above. IMPRESSION: MRI head: 1. No acute intracranial abnormality identified. 2. Stable advanced chronic microvascular ischemic changes and moderate volume loss of the brain. Multiple small chronic infarctions are present in bifrontal periventricular white matter, right frontal cortex, left occipital cortex, and bilateral cerebellar hemispheres. MRA head: Patent anterior and posterior intracranial circulation. No large vessel occlusion, aneurysm, or significant stenosis is identified. Electronically Signed   By: Kristine Garbe M.D.   On: 09/28/2018 22:54   Mr Jodene Nam Head/brain MB Cm  Result Date: 09/28/2018 CLINICAL DATA:  85 y/o  F; speech difficulty. EXAM: MRI HEAD WITHOUT CONTRAST MRA HEAD WITHOUT CONTRAST TECHNIQUE: Multiplanar, multiecho pulse sequences of the brain and surrounding structures were obtained without intravenous contrast. Angiographic images of the head were obtained using MRA technique without contrast. COMPARISON:  09/28/2018 CT head and CTA head. FINDINGS: MRI HEAD FINDINGS Brain: No acute infarction, hemorrhage, hydrocephalus, extra-axial collection or mass lesion. Partially empty sella turcica. Large confluent nonspecific T2 FLAIR hyperintensities in subcortical and periventricular white matter are compatible with advanced chronic microvascular ischemic changes. Moderate volume loss of the brain. Small chronic infarcts are present in bifrontal  periventricular white matter. Small cortical infarcts are present in the right superior frontal lobe and the left occipital lobe. Very small chronic infarcts are present within the inferior cerebellar hemispheres bilateral as well as the bilateral lentiform nuclei. There is hemosiderin staining of the lentiform nuclei infarcts. Vascular: Normal flow voids. Skull and upper cervical spine: Normal marrow signal. Hyperostosis frontalis interna. Sinuses/Orbits: Negative. Other: Bilateral intra-ocular lens replacement. MRA HEAD FINDINGS Internal carotid arteries: Patent. Mild non  stenotic lumen irregularity compatible with atherosclerosis. Anterior cerebral arteries:  Patent. Middle cerebral arteries: Patent. Anterior communicating artery: Patent. Posterior communicating arteries: Not identified, likely hypoplastic or absent. Posterior cerebral arteries:  Patent. Basilar artery:  Patent. Vertebral arteries:  Patent. No evidence of high-grade stenosis, large vessel occlusion, or aneurysm unless noted above. IMPRESSION: MRI head: 1. No acute intracranial abnormality identified. 2. Stable advanced chronic microvascular ischemic changes and moderate volume loss of the brain. Multiple small chronic infarctions are present in bifrontal periventricular white matter, right frontal cortex, left occipital cortex, and bilateral cerebellar hemispheres. MRA head: Patent anterior and posterior intracranial circulation. No large vessel occlusion, aneurysm, or significant stenosis is identified. Electronically Signed   By: Kristine Garbe M.D.   On: 09/28/2018 22:54   Ct Head Code Stroke Wo Contrast  Result Date: 09/28/2018 CLINICAL DATA:  Code stroke.  Slurred speech, aphasia EXAM: CT HEAD WITHOUT CONTRAST TECHNIQUE: Contiguous axial images were obtained from the base of the skull through the vertex without intravenous contrast. COMPARISON:  CT head 06/04/2018 FINDINGS: Brain: Moderate atrophy. Extensive chronic  microvascular ischemic change throughout the white matter. Chronic infarct right frontal lobe. Small chronic infarcts cerebellum bilaterally unchanged. Negative for acute infarct. Negative for hemorrhage or mass. No midline shift. Vascular: Negative for hyperdense vessel. Atherosclerotic disease in the cavernous carotid bilaterally. Skull: Negative Sinuses/Orbits: Negative Other: None ASPECTS ([place] Stroke Program Early CT Score) - Ganglionic level infarction (caudate, lentiform nuclei, internal capsule, insula, M1-M3 cortex): 7 - Supraganglionic infarction (M4-M6 cortex): 3 Total score (0-10 with 10 being normal): 10 IMPRESSION: 1. No acute intracranial abnormality. 2. Atrophy and extensive chronic ischemic change. 3. ASPECTS is 10 4. These results were called by telephone at the time of interpretation on 09/28/2018 at 11:29 am to Dr. Lenise Arena , who verbally acknowledged these results. Electronically Signed   By: Franchot Gallo M.D.   On: 09/28/2018 11:30       Today   Subjective:   Stephanie Harris patient complains of feeling weak Objective:   Blood pressure (!) 134/56, pulse 66, temperature 98.2 F (36.8 C), temperature source Oral, resp. rate 18, weight 42.1 kg, SpO2 100 %.  .  Intake/Output Summary (Last 24 hours) at 10/03/2018 1230 Last data filed at 10/03/2018 0936 Gross per 24 hour  Intake 240 ml  Output 350 ml  Net -110 ml    Exam VITAL SIGNS: Blood pressure (!) 134/56, pulse 66, temperature 98.2 F (36.8 C), temperature source Oral, resp. rate 18, weight 42.1 kg, SpO2 100 %.  GENERAL:  85 y.o.-year-old patient lying in the bed with no acute distress.  EYES: Pupils equal, round, reactive to light and accommodation. No scleral icterus. Extraocular muscles intact.  HEENT: Head atraumatic, normocephalic. Oropharynx and nasopharynx clear.  NECK:  Supple, no jugular venous distention. No thyroid enlargement, no tenderness.  LUNGS: Normal breath sounds bilaterally, no wheezing,  rales,rhonchi or crepitation. No use of accessory muscles of respiration.  CARDIOVASCULAR: S1, S2 normal. No murmurs, rubs, or gallops.  ABDOMEN: Soft, nontender, nondistended. Bowel sounds present. No organomegaly or mass.  EXTREMITIES: No pedal edema, cyanosis, or clubbing.  NEUROLOGIC: Cranial nerves II through XII are intact. Muscle strength 5/5 in all extremities. Sensation intact. Gait not checked.  PSYCHIATRIC: The patient is alert and oriented x 3.  SKIN: No obvious rash, lesion, or ulcer.   Data Review     CBC w Diff:  Lab Results  Component Value Date   WBC 6.4 10/01/2018   HGB 10.4 (L)  10/01/2018   HGB 9.6 (L) 11/24/2013   HCT 35.1 (L) 10/01/2018   HCT 30.8 (L) 11/24/2013   PLT 107 (L) 10/01/2018   PLT 109 (L) 11/24/2013   LYMPHOPCT 56 09/28/2018   LYMPHOPCT 45.3 11/24/2013   MONOPCT 18 09/28/2018   MONOPCT 16.1 11/24/2013   EOSPCT 1 09/28/2018   EOSPCT 1.4 11/24/2013   BASOPCT 1 09/28/2018   BASOPCT 0.3 11/24/2013   CMP:  Lab Results  Component Value Date   NA 139 10/01/2018   NA 138 11/24/2013   K 3.6 10/01/2018   K 3.6 11/24/2013   CL 105 10/01/2018   CL 105 11/24/2013   CO2 28 10/01/2018   CO2 25 11/24/2013   BUN 21 10/01/2018   BUN 18 11/24/2013   CREATININE 1.08 (H) 10/01/2018   CREATININE 1.32 (H) 11/24/2013   PROT 7.2 09/28/2018   PROT 7.6 11/23/2013   ALBUMIN 4.2 09/28/2018   ALBUMIN 3.6 11/23/2013   BILITOT 2.0 (H) 09/28/2018   BILITOT 1.2 (H) 11/23/2013   ALKPHOS 26 (L) 09/28/2018   ALKPHOS 40 (L) 11/23/2013   AST 20 09/28/2018   AST 32 11/23/2013   ALT 13 09/28/2018   ALT 29 11/23/2013  .  Micro Results No results found for this or any previous visit (from the past 240 hour(s)).      Code Status Orders  (From admission, onward)         Start     Ordered   09/28/18 1716  Full code  Continuous     09/28/18 1715        Code Status History    Date Active Date Inactive Code Status Order ID Comments User Context    03/18/2018 1739 03/22/2018 1539 Full Code [id number]  Steve Rattler, DO Inpatient   11/06/2017 0101 11/09/2017 1704 Full Code [id number]  Amelia Jo, MD Inpatient   08/11/2017 0733 08/13/2017 2053 Full Code [id number]  Hillary Bow, MD ED   04/28/2016 1820 04/30/2016 1721 Full Code [id number]  [name], MD Inpatient   03/14/2015 1255 03/15/2015 1808 Full Code [id number]  [name], MD Inpatient   01/29/2015 0527 01/30/2015 1724 Full Code [id number]  Harrie Foreman, MD Inpatient            Discharge Medications   Allergies as of 10/03/2018      Reactions   Latex Rash   Meperidine    Other reaction(s): Other (See Comments) Other Reaction: CNS Disorder   Penicillins Other (See Comments)   Has patient had a PCN reaction causing immediate rash, facial/tongue/throat swelling, SOB or lightheadedness with hypotension: Unknown Has patient had a PCN reaction causing severe rash involving mucus membranes or skin necrosis: Unknown Has patient had a PCN reaction that required hospitalization: Unknown Has patient had a PCN reaction occurring within the last 10 years: Unknown If all of the above answers are "NO", then may proceed with Cephalosporin use.   Cefuroxime Axetil Nausea And Vomiting   Codeine Nausea And Vomiting, Nausea Only   Propoxyphene Nausea Only   Other reaction(s): Vomiting      Medication List    TAKE these medications   acetaminophen 500 MG tablet Commonly known as:  TYLENOL Take 1,000 mg by mouth every 8 (eight) hours.   amLODipine 5 MG tablet Commonly known as:  NORVASC Take 1 tablet (5 mg total) by mouth daily. Start taking on:  October 04, 2018   atorvastatin 40 MG tablet Commonly known as:  LIPITOR Take 0.5 tablets (20  mg total) by mouth daily at 6 PM. What changed:  medication strength   levothyroxine 50 MCG tablet Commonly known as:  SYNTHROID, LEVOTHROID Take 1 tablet (50 mcg total) by mouth daily.   linaclotide 145 MCG Caps capsule Commonly known  as:  LINZESS Take 1 capsule (145 mcg total) by mouth daily before breakfast.   lisinopril 2.5 MG tablet Commonly known as:  PRINIVIL,ZESTRIL Take 2 tablets (5 mg total) by mouth daily. Start taking on:  October 04, 2018   ondansetron 4 MG disintegrating tablet Commonly known as:  ZOFRAN ODT Take 1 tablet (4 mg total) by mouth every 8 (eight) hours as needed for nausea or vomiting.   oxyCODONE 5 MG immediate release tablet Commonly known as:  Oxy IR/ROXICODONE Take 2.5-5 mg by mouth every 6 (six) hours as needed for moderate pain or severe pain.          Total Time in preparing paper work, data evaluation and todays exam - 84 minutes  Dustin Flock M.D on 10/03/2018 at 12:30 PM [place]  [phone]

## 2018-10-03 NOTE — Progress Notes (Addendum)
[facility] NAME: Stephanie Harris    MR#:  [id number]  DATE OF BIRTH:  [birth year]  SUBJECTIVE:  CHIEF COMPLAINT:   Chief Complaint  Patient presents with  . slurred speech   Patient complains of feeling weak no other complaints  REVIEW OF SYSTEMS:  Review of Systems  Constitutional: Negative for chills, diaphoresis, fever and malaise/fatigue.  HENT: Negative for congestion and sore throat.   Eyes: Negative for blurred vision and double vision.  Respiratory: Negative for cough, shortness of breath and wheezing.   Cardiovascular: Negative for chest pain, palpitations and leg swelling.  Gastrointestinal: Negative for abdominal pain, heartburn, nausea and vomiting.  Genitourinary: Negative for dysuria.  Musculoskeletal: Negative for myalgias.  Skin: Negative for itching and rash.  Neurological: Negative for dizziness, tingling, tremors, sensory change, speech change, focal weakness, loss of consciousness, weakness and headaches.  Endo/Heme/Allergies: Does not bruise/bleed easily.  Psychiatric/Behavioral: Negative for depression. The patient is not nervous/anxious.     DRUG ALLERGIES:   Allergies  Allergen Reactions  . Latex Rash  . Meperidine     Other reaction(s): Other (See Comments) Other Reaction: CNS Disorder  . Penicillins Other (See Comments)    Has patient had a PCN reaction causing immediate rash, facial/tongue/throat swelling, SOB or lightheadedness with hypotension: Unknown Has patient had a PCN reaction causing severe rash involving mucus membranes or skin necrosis: Unknown Has patient had a PCN reaction that required hospitalization: Unknown Has patient had a PCN reaction occurring within the last 10 years: Unknown If all of the above answers are "NO", then may proceed with Cephalosporin use.   . Cefuroxime Axetil Nausea And Vomiting  . Codeine Nausea And Vomiting and Nausea Only  . Propoxyphene Nausea Only    Other reaction(s): Vomiting   VITALS:  Blood pressure (!) 134/56, pulse 66, temperature 98.2 F (36.8 C), temperature source Oral, resp. rate 18, weight 42.1 kg, SpO2 100 %. PHYSICAL EXAMINATION:  GENERAL:Frail 85 y.o.-year-old patient lying in the bed with no acute distress.  EYES: Pupils equal, round, reactive to light and accommodation. No scleral icterus. Extraocular muscles intact.  HEENT: Head atraumatic, normocephalic. Oropharynx and nasopharynx clear.  NECK: Supple, no jugular venous distention.  LUNGS: Normal breath sounds bilaterally, no wheezing, rales, rhonchi or crepitation. No use of accessory muscles of respiration.  CARDIOVASCULAR: S1, S2 normal. No murmurs, rubs, or gallops.  ABDOMEN: Soft, nontender, nondistended. Bowel sounds present. No organomegaly or mass.  EXTREMITIES: No pedal edema, cyanosis, or clubbing.  NEUROLOGIC:Awake, alert and oriented x3. Muscle strength3/5 in all extremities. Sensation intact. Gait not checked.No expressive aphasia noted during exam. PSYCHIATRIC: The patient is alert and oriented x 3.  SKIN: No obvious rash, lesion, or ulcer.  LABORATORY PANEL:  Female CBC Recent Labs  Lab 10/01/18 0419  WBC 6.4  HGB 10.4*  HCT 35.1*  PLT 107*   ------------------------------------------------------------------------------------------------------------------ Chemistries  Recent Labs  Lab 09/28/18 1103  10/01/18 0419  NA 139   < > 139  K 3.1*   < > 3.6  CL 105   < > 105  CO2 28   < > 28  GLUCOSE 108*   < > 96  BUN 19   < > 21  CREATININE 1.10*   < > 1.08*  CALCIUM 9.6   < > 9.7  AST 20  --   --   ALT 13  --   --   ALKPHOS 26*  --   --  BILITOT 2.0*  --   --    < > = values in this interval not displayed.   RADIOLOGY:  Mr Brain 89 Contrast  Result Date: 09/28/2018 CLINICAL DATA:  85 y/o  F; speech difficulty. EXAM: MRI HEAD WITHOUT CONTRAST MRA HEAD WITHOUT CONTRAST TECHNIQUE: Multiplanar, multiecho pulse sequences of the brain  and surrounding structures were obtained without intravenous contrast. Angiographic images of the head were obtained using MRA technique without contrast. COMPARISON:  09/28/2018 CT head and CTA head. FINDINGS: MRI HEAD FINDINGS Brain: No acute infarction, hemorrhage, hydrocephalus, extra-axial collection or mass lesion. Partially empty sella turcica. Large confluent nonspecific T2 FLAIR hyperintensities in subcortical and periventricular white matter are compatible with advanced chronic microvascular ischemic changes. Moderate volume loss of the brain. Small chronic infarcts are present in bifrontal periventricular white matter. Small cortical infarcts are present in the right superior frontal lobe and the left occipital lobe. Very small chronic infarcts are present within the inferior cerebellar hemispheres bilateral as well as the bilateral lentiform nuclei. There is hemosiderin staining of the lentiform nuclei infarcts. Vascular: Normal flow voids. Skull and upper cervical spine: Normal marrow signal. Hyperostosis frontalis interna. Sinuses/Orbits: Negative. Other: Bilateral intra-ocular lens replacement. MRA HEAD FINDINGS Internal carotid arteries: Patent. Mild non stenotic lumen irregularity compatible with atherosclerosis. Anterior cerebral arteries:  Patent. Middle cerebral arteries: Patent. Anterior communicating artery: Patent. Posterior communicating arteries: Not identified, likely hypoplastic or absent. Posterior cerebral arteries:  Patent. Basilar artery:  Patent. Vertebral arteries:  Patent. No evidence of high-grade stenosis, large vessel occlusion, or aneurysm unless noted above. IMPRESSION: MRI head: 1. No acute intracranial abnormality identified. 2. Stable advanced chronic microvascular ischemic changes and moderate volume loss of the brain. Multiple small chronic infarctions are present in bifrontal periventricular white matter, right frontal cortex, left occipital cortex, and bilateral  cerebellar hemispheres. MRA head: Patent anterior and posterior intracranial circulation. No large vessel occlusion, aneurysm, or significant stenosis is identified. Electronically Signed   By: Kristine Garbe M.D.   On: 09/28/2018 22:54   Mr Jodene Nam Head/brain RX Cm  Result Date: 09/28/2018 CLINICAL DATA:  85 y/o  F; speech difficulty. EXAM: MRI HEAD WITHOUT CONTRAST MRA HEAD WITHOUT CONTRAST TECHNIQUE: Multiplanar, multiecho pulse sequences of the brain and surrounding structures were obtained without intravenous contrast. Angiographic images of the head were obtained using MRA technique without contrast. COMPARISON:  09/28/2018 CT head and CTA head. FINDINGS: MRI HEAD FINDINGS Brain: No acute infarction, hemorrhage, hydrocephalus, extra-axial collection or mass lesion. Partially empty sella turcica. Large confluent nonspecific T2 FLAIR hyperintensities in subcortical and periventricular white matter are compatible with advanced chronic microvascular ischemic changes. Moderate volume loss of the brain. Small chronic infarcts are present in bifrontal periventricular white matter. Small cortical infarcts are present in the right superior frontal lobe and the left occipital lobe. Very small chronic infarcts are present within the inferior cerebellar hemispheres bilateral as well as the bilateral lentiform nuclei. There is hemosiderin staining of the lentiform nuclei infarcts. Vascular: Normal flow voids. Skull and upper cervical spine: Normal marrow signal. Hyperostosis frontalis interna. Sinuses/Orbits: Negative. Other: Bilateral intra-ocular lens replacement. MRA HEAD FINDINGS Internal carotid arteries: Patent. Mild non stenotic lumen irregularity compatible with atherosclerosis. Anterior cerebral arteries:  Patent. Middle cerebral arteries: Patent. Anterior communicating artery: Patent. Posterior communicating arteries: Not identified, likely hypoplastic or absent. Posterior cerebral arteries:  Patent.  Basilar artery:  Patent. Vertebral arteries:  Patent. No evidence of high-grade stenosis, large vessel occlusion, or aneurysm unless noted above. IMPRESSION: MRI head:  1. No acute intracranial abnormality identified. 2. Stable advanced chronic microvascular ischemic changes and moderate volume loss of the brain. Multiple small chronic infarctions are present in bifrontal periventricular white matter, right frontal cortex, left occipital cortex, and bilateral cerebellar hemispheres. MRA head: Patent anterior and posterior intracranial circulation. No large vessel occlusion, aneurysm, or significant stenosis is identified. Electronically Signed   By: Kristine Garbe M.D.   On: 09/28/2018 22:54   ASSESSMENT AND PLAN:   Stephanie Nofsinger Websteris an 38 y.o.femalewith past medical history significant forCAD,CML, CKD, bilateral carotid disease status post right carotid stent, hypertension, and anxiety, who presented to the hospital with acute onset of slurred speech and expressive aphasia.In the emergency room patient was not a candidate for IVTPA, SBP > 200. CTA showed no large vessel occlusion. Pt was admitted for stroke work up and hypertension urgency management. Negative stroke work-up, hypertension improving.  #TIA - Neuro c/s, appreciate recs and evaluation below: - MRI brain reviewed, no acute changes.  - Hypertensive urgency blood pressure improved - Continue ASA and Lipitor for stroke prophylaxis  - Stable thrombocytopenia, will monitor  - PT/OT and Speech evaluation: OT recs for SNF, PT recs for SNF, SP signs off but recommends OP voice treatment/OP ENT at discharge -Waiting discharge to rehab - Echocardiogram complete: pending - fasting lipid panel, TSH, hemoglobin A1c: all within normal limits - Patient has passed bedside swallow evaluation  #Hypertensive urgency Blood pressure much improved Due to diabetes add ACE inhibitor low-dose   #Hypothyroidism continue  Synthyroid  #Hypokalemia repleted and stable. Will monitor  #H/o CML-follow-up with primary care physician/oncology for surveillance  #History of bilateral carotid artery disease status post right carotid stent  #Chronic hyperlipidemia continue statin and check lipid panel in a.m: lipid panel within normal limits  #Anemia due to thalassemia according to the patient I will check her iron levels   All the records are reviewed and case is discussed with Care Management/Social Worker. Management plans discussed with the patient and/or family and they are in agreement.  CODE STATUS: Full Code  TOTAL TIME TAKING CARE OF THIS PATIENT: 30 minutes.   More than 50% of the time was spent in counseling/coordination of care: YES  POSSIBLE D/C IN 1 DAYS, DEPENDING ON CLINICAL CONDITION AND PLACEMENT.   Tuwana Kapaun PA-C on 10/03/2018 at 12:33 PM  Between 7am to 6pm - Pager - [phone]  After 6 pm go to [email or web address] - password [email or web address] [facility]  Office  [phone]  CC: Primary care physician; [name], MD

## 2018-10-03 NOTE — Progress Notes (Signed)
Occupational Therapy Treatment Patient Details Name: Stephanie Harris MRN: [id number] DOB: Jul 18, 1931 Today's Date: 10/03/2018    History of present illness 85 y.o. female with past medical history significant for CAD, CML with thrombocytopenia, bilateral carotid disease status post right carotid stent, hypertension, and anxiety, chronic kidney disease who presented to the hospital with acute onset of difficulty speaking. Admitted for stroke work  up.  Imaging (CT, MRI) negative for acute infarct or large vessel occlusion to date.   OT comments  Pt seen for OT tx on this date. Upon arrival to room, pt in bed with nursing staff in room. Pt initially declined OT tx and was visibly upset (tears in her eyes) about new instatement of contact precautions while pt is tested for C-difficile. OT provided active listening and pt education on hospital use and policies for contact precautions as infection control measures. Pt upset that her husband would have to wear a gown to come into her room, and stated that this was "her fault". OT provided re-assurance and pt education on the common need for contact precautions as a means of ensuring  pt and staff safety and limiting spread of contagious illnesses such as C-difficile. Provided pt education on importance of hand hygrine after using the commode as a way for pt to actively participate in continued infection control measures.  Pt appeared less agitated by end of session, but continued to decline discussion of ECS handout. Handout left in room with pt. Pt verbally agreed to review handout and ask any questions she might have at a later OT session. OT informed pt of chaplain services offered at [facility]. Pt requesting to speak with chaplain, RN notified. Pt also requesting to speak with MD, RN notified.  Will continue to follow POC as written.  Discharge recommendation remains appropriate.    Follow Up Recommendations  SNF    Equipment Recommendations  None recommended  by OT    Recommendations for Other Services      Precautions / Restrictions Precautions Precautions: Fall Restrictions Weight Bearing Restrictions: No       Mobility Bed Mobility                  Transfers                 General transfer comment: Mobility deferred on this date. Pt in bed at beginning and end of session.     Balance                                           ADL either performed or assessed with clinical judgement   ADL Overall ADL's : Needs assistance/impaired Eating/Feeding: Set up;Sitting   Grooming: Sitting;Set up   Upper Body Bathing: Sitting;Minimal assistance   Lower Body Bathing: Sit to/from stand;Moderate assistance   Upper Body Dressing : Sitting;Minimal assistance   Lower Body Dressing: Sit to/from stand;Moderate assistance   Toilet Transfer: RW;Minimal assistance;Moderate assistance;Ambulation;BSC                   Vision Baseline Vision/History: Wears glasses Wears Glasses: At all times Patient Visual Report: No change from baseline     Perception     Praxis      Cognition Arousal/Alertness: Awake/alert Behavior During Therapy: Anxious;WFL for tasks assessed/performed Overall Cognitive Status: Within Functional Limits for tasks assessed  General Comments: Upon arrival to room, pt upset/anxious about contact precautions. Initially refused OT, but was willing to speak with OT about reason for infection control/prevention measures.         Exercises Other Exercises Other Exercises: Pt educated on hospital protocol for contact precautions as infection control and prevention measure. Pt upset by need and endorsing that it was "her fault" people needed to wear gowns in her room. Provided active listening and reassurance.   Shoulder Instructions       General Comments      Pertinent Vitals/ Pain       Pain Score: 0-No pain  Home Living                                           Prior Functioning/Environment              Frequency  Min 1X/week        Progress Toward Goals  OT Goals(current goals can now be found in the care plan section)  Progress towards OT goals: Progressing toward goals  Acute Rehab OT Goals Patient Stated Goal: to get stronger OT Goal Formulation: With patient Time For Goal Achievement: 10/13/18 Potential to Achieve Goals: Good  Plan Discharge plan remains appropriate    Co-evaluation                 AM-PAC OT "6 Clicks" Daily Activity     Outcome Measure   Help from another person eating meals?: None Help from another person taking care of personal grooming?: None Help from another person toileting, which includes using toliet, bedpan, or urinal?: A Lot Help from another person bathing (including washing, rinsing, drying)?: A Lot Help from another person to put on and taking off regular upper body clothing?: A Little Help from another person to put on and taking off regular lower body clothing?: A Lot 6 Click Score: 17    End of Session    OT Visit Diagnosis: Other abnormalities of gait and mobility (R26.89);Muscle weakness (generalized) (M62.81);History of falling (Z91.81);Pain;Dizziness and giddiness (R42) Pain - Right/Left: Right Pain - part of body: Hip   Activity Tolerance Treatment limited secondary to agitation   Patient Left in bed;with bed alarm set;with SCD's reapplied;with call bell/phone within reach   Nurse Communication Other (comment)(Pt requesting chaplain, pt requesting to speak with MD. )        Time: 6754-4920 OT Time Calculation (min): 19 min  Charges: OT General Charges $OT Visit: 1 Visit OT Treatments $Self Care/Home Management : 8-22 mins  Shara Blazing, M.S., OTR/L Ascom: [phone] 10/03/18, 12:18 PM

## 2018-10-04 ENCOUNTER — Ambulatory Visit: Payer: Self-pay

## 2018-10-04 DIAGNOSIS — I160 Hypertensive urgency: Secondary | ICD-10-CM | POA: Diagnosis not present

## 2018-10-04 DIAGNOSIS — M2550 Pain in unspecified joint: Secondary | ICD-10-CM | POA: Diagnosis not present

## 2018-10-04 DIAGNOSIS — E876 Hypokalemia: Secondary | ICD-10-CM | POA: Diagnosis not present

## 2018-10-04 DIAGNOSIS — M533 Sacrococcygeal disorders, not elsewhere classified: Secondary | ICD-10-CM | POA: Diagnosis not present

## 2018-10-04 DIAGNOSIS — G319 Degenerative disease of nervous system, unspecified: Secondary | ICD-10-CM | POA: Diagnosis not present

## 2018-10-04 DIAGNOSIS — Z8249 Family history of ischemic heart disease and other diseases of the circulatory system: Secondary | ICD-10-CM | POA: Diagnosis not present

## 2018-10-04 DIAGNOSIS — R4701 Aphasia: Secondary | ICD-10-CM | POA: Diagnosis not present

## 2018-10-04 DIAGNOSIS — I959 Hypotension, unspecified: Secondary | ICD-10-CM | POA: Diagnosis not present

## 2018-10-04 DIAGNOSIS — I2510 Atherosclerotic heart disease of native coronary artery without angina pectoris: Secondary | ICD-10-CM | POA: Diagnosis not present

## 2018-10-04 DIAGNOSIS — M8580 Other specified disorders of bone density and structure, unspecified site: Secondary | ICD-10-CM | POA: Diagnosis not present

## 2018-10-04 DIAGNOSIS — D72820 Lymphocytosis (symptomatic): Secondary | ICD-10-CM | POA: Diagnosis not present

## 2018-10-04 DIAGNOSIS — Z856 Personal history of leukemia: Secondary | ICD-10-CM | POA: Diagnosis not present

## 2018-10-04 DIAGNOSIS — E039 Hypothyroidism, unspecified: Secondary | ICD-10-CM | POA: Diagnosis not present

## 2018-10-04 DIAGNOSIS — M6281 Muscle weakness (generalized): Secondary | ICD-10-CM | POA: Diagnosis not present

## 2018-10-04 DIAGNOSIS — E782 Mixed hyperlipidemia: Secondary | ICD-10-CM | POA: Diagnosis not present

## 2018-10-04 DIAGNOSIS — R531 Weakness: Secondary | ICD-10-CM | POA: Diagnosis not present

## 2018-10-04 DIAGNOSIS — K5900 Constipation, unspecified: Secondary | ICD-10-CM | POA: Diagnosis not present

## 2018-10-04 DIAGNOSIS — M25551 Pain in right hip: Secondary | ICD-10-CM | POA: Diagnosis not present

## 2018-10-04 DIAGNOSIS — E785 Hyperlipidemia, unspecified: Secondary | ICD-10-CM | POA: Diagnosis not present

## 2018-10-04 DIAGNOSIS — Z9181 History of falling: Secondary | ICD-10-CM | POA: Diagnosis not present

## 2018-10-04 DIAGNOSIS — R718 Other abnormality of red blood cells: Secondary | ICD-10-CM | POA: Diagnosis not present

## 2018-10-04 DIAGNOSIS — D509 Iron deficiency anemia, unspecified: Secondary | ICD-10-CM | POA: Diagnosis not present

## 2018-10-04 DIAGNOSIS — R42 Dizziness and giddiness: Secondary | ICD-10-CM | POA: Diagnosis not present

## 2018-10-04 DIAGNOSIS — G459 Transient cerebral ischemic attack, unspecified: Secondary | ICD-10-CM | POA: Diagnosis not present

## 2018-10-04 DIAGNOSIS — S3210XA Unspecified fracture of sacrum, initial encounter for closed fracture: Secondary | ICD-10-CM | POA: Diagnosis not present

## 2018-10-04 DIAGNOSIS — I10 Essential (primary) hypertension: Secondary | ICD-10-CM | POA: Diagnosis not present

## 2018-10-04 DIAGNOSIS — N183 Chronic kidney disease, stage 3 (moderate): Secondary | ICD-10-CM | POA: Diagnosis not present

## 2018-10-04 DIAGNOSIS — E1122 Type 2 diabetes mellitus with diabetic chronic kidney disease: Secondary | ICD-10-CM | POA: Diagnosis not present

## 2018-10-04 DIAGNOSIS — M545 Low back pain: Secondary | ICD-10-CM | POA: Diagnosis not present

## 2018-10-04 DIAGNOSIS — I252 Old myocardial infarction: Secondary | ICD-10-CM | POA: Diagnosis not present

## 2018-10-04 DIAGNOSIS — R112 Nausea with vomiting, unspecified: Secondary | ICD-10-CM | POA: Diagnosis not present

## 2018-10-04 DIAGNOSIS — I129 Hypertensive chronic kidney disease with stage 1 through stage 4 chronic kidney disease, or unspecified chronic kidney disease: Secondary | ICD-10-CM | POA: Diagnosis not present

## 2018-10-04 DIAGNOSIS — Z8673 Personal history of transient ischemic attack (TIA), and cerebral infarction without residual deficits: Secondary | ICD-10-CM | POA: Diagnosis not present

## 2018-10-04 DIAGNOSIS — M069 Rheumatoid arthritis, unspecified: Secondary | ICD-10-CM | POA: Diagnosis not present

## 2018-10-04 DIAGNOSIS — S3210XD Unspecified fracture of sacrum, subsequent encounter for fracture with routine healing: Secondary | ICD-10-CM | POA: Diagnosis not present

## 2018-10-04 DIAGNOSIS — E1151 Type 2 diabetes mellitus with diabetic peripheral angiopathy without gangrene: Secondary | ICD-10-CM | POA: Diagnosis not present

## 2018-10-04 DIAGNOSIS — Z7401 Bed confinement status: Secondary | ICD-10-CM | POA: Diagnosis not present

## 2018-10-04 DIAGNOSIS — R52 Pain, unspecified: Secondary | ICD-10-CM | POA: Diagnosis not present

## 2018-10-04 DIAGNOSIS — D631 Anemia in chronic kidney disease: Secondary | ICD-10-CM | POA: Diagnosis not present

## 2018-10-04 DIAGNOSIS — R2689 Other abnormalities of gait and mobility: Secondary | ICD-10-CM | POA: Diagnosis not present

## 2018-10-04 DIAGNOSIS — W19XXXA Unspecified fall, initial encounter: Secondary | ICD-10-CM | POA: Diagnosis not present

## 2018-10-04 DIAGNOSIS — D696 Thrombocytopenia, unspecified: Secondary | ICD-10-CM | POA: Diagnosis not present

## 2018-10-04 LAB — C DIFFICILE QUICK SCREEN W PCR REFLEX
C Diff antigen: NEGATIVE
C Diff interpretation: NOT DETECTED
C Diff toxin: NEGATIVE

## 2018-10-04 MED ORDER — CYANOCOBALAMIN 1000 MCG/ML IJ SOLN
1000.0000 ug | Freq: Once | INTRAMUSCULAR | Status: AC
  Administered 2018-10-04: 1000 ug via INTRAMUSCULAR
  Filled 2018-10-04: qty 1

## 2018-10-04 NOTE — Clinical Social Work Note (Addendum)
CSW was informed by patient that her husband would transport her, however CSW tried to call patient's husband and there was no answer.  CSW to try again at a later time.  Patient to be d/c'ed today to [facility] room 810.  Patient and family agreeable to plans will transport via husband's car RN to call report [phone].  12:00pm Patient's husband was at bedside and is agreeable to transporting patient, CSW updated bedside nurse.  2:30pm  Patient stated that she feels she should use EMS transport now due to being weak and not feeling steady on her feet.  Evette Cristal, MSW, [place]

## 2018-10-04 NOTE — Clinical Social Work Placement (Signed)
   CLINICAL SOCIAL WORK PLACEMENT  NOTE  Date:  10/04/2018  Patient Details  Name: Stephanie Harris MRN: [id number] Date of Birth: 27-Aug-1930  Clinical Social Work is seeking post-discharge placement for this patient at the [place] level of care (*CSW will initial, date and re-position this form in  chart as items are completed):  Yes   Patient/family provided with [facility] Work Department's list of facilities offering this level of care within the geographic area requested by the patient (or if unable, by the patient's family).  Yes   Patient/family informed of their freedom to choose among providers that offer the needed level of care, that participate in Medicare, Medicaid or managed care program needed by the patient, have an available bed and are willing to accept the patient.  Yes   Patient/family informed of [facility]'s ownership interest in [facility] and [facility], as well as of the fact that they are under no obligation to receive care at these facilities.  PASRR submitted to EDS on 09/30/18     PASRR number received on       Existing PASRR number confirmed on 09/30/18     FL2 transmitted to all facilities in geographic area requested by pt/family on 09/30/18     FL2 transmitted to all facilities within larger geographic area on       Patient informed that his/her managed care company has contracts with or will negotiate with certain facilities, including the following:        Yes   Patient/family informed of bed offers received.  Patient chooses bed at [facility]     Physician recommends and patient chooses bed at      Patient to be transferred to [facility] [place] on 10/04/18.  Patient to be transferred to facility by [facility]     Patient family notified on 10/04/18 of transfer.  Name of family member notified:  Patient's husband Collins Scotland was at bedside.     PHYSICIAN Please sign FL2,  Please prepare prescriptions     Additional Comment:    _______________________________________________ Ross Ludwig, LCSWA 10/04/2018, 5:57 PM

## 2018-10-04 NOTE — Discharge Summary (Signed)
Stephanie Harris at [facility], 85 y.o., DOB [birth year], MRN [id number]. Admission date: 09/28/2018 Discharge Date 10/04/2018 Primary MD [name], MD Admitting Physician [name], MD  Admission Diagnosis  Expressive aphasia [R47.01]  Discharge Diagnosis   Active Problems:   TIA (transient ischemic attack) Accelerated hypertension Hypothyroidism Hypokalemia History of CML History of bilateral carotid artery disease status post right carotid stent Hyperlipidemia Anemia due to [place] an 85 y.o.femalewith past medical history significant forCAD,CML,CKD,bilateral carotid disease status post right carotid stent, hypertension,andanxiety, who presented to the hospital with acute onset ofslurred speech and expressive aphasia.In the emergency room patientwas not a candidate forIVTPA, SBP > 200.CTA showed no large vessel occlusion. Pt was admitted for stroke work up and hypertension urgency management. Negative stroke work-up, hypertension improving.  Patient underwent work-up MRI showed no evidence of stroke.  She was seen by neurology medication changes were recommended.  Patient continues to be very weak in need of rehab.   Pt had diarrehea stool was neg for c. Diff, hold linzess if having diarrhea   Pt wants regular diet    Consults  neurology  Significant Tests:  See full reports for all details     Ct Angio Head W Or Wo Contrast  Result Date: 09/28/2018 CLINICAL DATA:  Follow-up code stroke. Slurred speech. Mental status changes. EXAM: CT ANGIOGRAPHY HEAD AND NECK TECHNIQUE: Multidetector CT imaging of the head and neck was performed using the standard protocol during bolus administration of intravenous contrast. Multiplanar CT image reconstructions and MIPs were obtained to evaluate the vascular anatomy. Carotid stenosis measurements (when applicable) are obtained utilizing NASCET  criteria, using the distal internal carotid diameter as the denominator. CONTRAST:  67mL OMNIPAQUE IOHEXOL 350 MG/ML SOLN COMPARISON:  Head CT same day.  Multiple previous studies 2019. FINDINGS: CTA NECK FINDINGS Aortic arch: The aorta shows atherosclerotic change but no aneurysm or dissection. Branching pattern is normal. Right carotid system: Common carotid artery is tortuous but widely patent to the bifurcation. There is a carotid stent from the distal common carotid artery into the internal carotid artery. The stent appears patent without evidence of significant stenosis. There may be mild intimal thickening in the midportion. Beyond the stent, the internal carotid artery is widely patent to the skull base. Left carotid system: Common carotid artery is tortuous but widely patent to the bifurcation. There is calcified plaque at the carotid bifurcation and ICA bulb. Minimal diameter in the ICA bulb measures 3.5 mm. Compared to a more distal cervical ICA diameter of 4 mm, this represents only a 10-20% stenosis. Cervical ICA widely patent beyond that. Vertebral arteries: Calcified plaque at the left vertebral artery origin with stenosis of 50%. Beyond the origin, the vessel is widely patent to the foramen magnum. Right vertebral artery origin is widely patent. There is focal calcified plaque just distal to the origin with stenosis estimated at 75%. Beyond that, the vessel is widely patent through the cervical region to the foramen magnum. Skeleton: Ordinary spondylosis. Other neck: No mass or lymphadenopathy. Upper chest: Mild pulmonary scarring and dependent atelectasis. Review of the MIP images confirms the above findings CTA HEAD FINDINGS Anterior circulation: Both internal carotid arteries are widely patent through the skull base and siphon regions. There is ordinary siphon atherosclerotic calcification but no stenosis greater than 20%. The anterior and middle cerebral vessels are patent without large or medium  vessel occlusion. No correctable proximal stenosis.  Posterior circulation: Both vertebral arteries are patent through the foramen magnum to the basilar. No basilar stenosis. Posterior circulation branch vessels appear patent. There is mild distal vessel atherosclerotic irregularity. Venous sinuses: Patent and normal. Anatomic variants: None significant. Delayed phase: No abnormal enhancement. Review of the MIP images confirms the above findings IMPRESSION: Right carotid stent is patent without significant stenosis. There is probably mild intimal thickening in the midportion of the stent. Atherosclerotic disease at the left carotid bifurcation. Narrowing in the ICA bulb is only on the order of 10-20% when compared to the diameter of the more distal cervical ICA. 50% stenosis of the left vertebral artery origin, with wide patency beyond that. 75% stenosis just distal to the right vertebral artery origin with wide patency beyond that. No intracranial large or medium vessel occlusion or correctable proximal stenosis. Electronically Signed   By: Nelson Chimes M.D.   On: 09/28/2018 12:38   Ct Angio Neck W And/or Wo Contrast  Result Date: 09/28/2018 CLINICAL DATA:  Follow-up code stroke. Slurred speech. Mental status changes. EXAM: CT ANGIOGRAPHY HEAD AND NECK TECHNIQUE: Multidetector CT imaging of the head and neck was performed using the standard protocol during bolus administration of intravenous contrast. Multiplanar CT image reconstructions and MIPs were obtained to evaluate the vascular anatomy. Carotid stenosis measurements (when applicable) are obtained utilizing NASCET criteria, using the distal internal carotid diameter as the denominator. CONTRAST:  83mL OMNIPAQUE IOHEXOL 350 MG/ML SOLN COMPARISON:  Head CT same day.  Multiple previous studies 2019. FINDINGS: CTA NECK FINDINGS Aortic arch: The aorta shows atherosclerotic change but no aneurysm or dissection. Branching pattern is normal. Right carotid system:  Common carotid artery is tortuous but widely patent to the bifurcation. There is a carotid stent from the distal common carotid artery into the internal carotid artery. The stent appears patent without evidence of significant stenosis. There may be mild intimal thickening in the midportion. Beyond the stent, the internal carotid artery is widely patent to the skull base. Left carotid system: Common carotid artery is tortuous but widely patent to the bifurcation. There is calcified plaque at the carotid bifurcation and ICA bulb. Minimal diameter in the ICA bulb measures 3.5 mm. Compared to a more distal cervical ICA diameter of 4 mm, this represents only a 10-20% stenosis. Cervical ICA widely patent beyond that. Vertebral arteries: Calcified plaque at the left vertebral artery origin with stenosis of 50%. Beyond the origin, the vessel is widely patent to the foramen magnum. Right vertebral artery origin is widely patent. There is focal calcified plaque just distal to the origin with stenosis estimated at 75%. Beyond that, the vessel is widely patent through the cervical region to the foramen magnum. Skeleton: Ordinary spondylosis. Other neck: No mass or lymphadenopathy. Upper chest: Mild pulmonary scarring and dependent atelectasis. Review of the MIP images confirms the above findings CTA HEAD FINDINGS Anterior circulation: Both internal carotid arteries are widely patent through the skull base and siphon regions. There is ordinary siphon atherosclerotic calcification but no stenosis greater than 20%. The anterior and middle cerebral vessels are patent without large or medium vessel occlusion. No correctable proximal stenosis. Posterior circulation: Both vertebral arteries are patent through the foramen magnum to the basilar. No basilar stenosis. Posterior circulation branch vessels appear patent. There is mild distal vessel atherosclerotic irregularity. Venous sinuses: Patent and normal. Anatomic variants: None  significant. Delayed phase: No abnormal enhancement. Review of the MIP images confirms the above findings IMPRESSION: Right carotid stent is patent without significant stenosis.  There is probably mild intimal thickening in the midportion of the stent. Atherosclerotic disease at the left carotid bifurcation. Narrowing in the ICA bulb is only on the order of 10-20% when compared to the diameter of the more distal cervical ICA. 50% stenosis of the left vertebral artery origin, with wide patency beyond that. 75% stenosis just distal to the right vertebral artery origin with wide patency beyond that. No intracranial large or medium vessel occlusion or correctable proximal stenosis. Electronically Signed   By: Nelson Chimes M.D.   On: 09/28/2018 12:38   Mr Brain Wo Contrast  Result Date: 09/28/2018 CLINICAL DATA:  85 y/o  F; speech difficulty. EXAM: MRI HEAD WITHOUT CONTRAST MRA HEAD WITHOUT CONTRAST TECHNIQUE: Multiplanar, multiecho pulse sequences of the brain and surrounding structures were obtained without intravenous contrast. Angiographic images of the head were obtained using MRA technique without contrast. COMPARISON:  09/28/2018 CT head and CTA head. FINDINGS: MRI HEAD FINDINGS Brain: No acute infarction, hemorrhage, hydrocephalus, extra-axial collection or mass lesion. Partially empty sella turcica. Large confluent nonspecific T2 FLAIR hyperintensities in subcortical and periventricular white matter are compatible with advanced chronic microvascular ischemic changes. Moderate volume loss of the brain. Small chronic infarcts are present in bifrontal periventricular white matter. Small cortical infarcts are present in the right superior frontal lobe and the left occipital lobe. Very small chronic infarcts are present within the inferior cerebellar hemispheres bilateral as well as the bilateral lentiform nuclei. There is hemosiderin staining of the lentiform nuclei infarcts. Vascular: Normal flow voids. Skull and  upper cervical spine: Normal marrow signal. Hyperostosis frontalis interna. Sinuses/Orbits: Negative. Other: Bilateral intra-ocular lens replacement. MRA HEAD FINDINGS Internal carotid arteries: Patent. Mild non stenotic lumen irregularity compatible with atherosclerosis. Anterior cerebral arteries:  Patent. Middle cerebral arteries: Patent. Anterior communicating artery: Patent. Posterior communicating arteries: Not identified, likely hypoplastic or absent. Posterior cerebral arteries:  Patent. Basilar artery:  Patent. Vertebral arteries:  Patent. No evidence of high-grade stenosis, large vessel occlusion, or aneurysm unless noted above. IMPRESSION: MRI head: 1. No acute intracranial abnormality identified. 2. Stable advanced chronic microvascular ischemic changes and moderate volume loss of the brain. Multiple small chronic infarctions are present in bifrontal periventricular white matter, right frontal cortex, left occipital cortex, and bilateral cerebellar hemispheres. MRA head: Patent anterior and posterior intracranial circulation. No large vessel occlusion, aneurysm, or significant stenosis is identified. Electronically Signed   By: Kristine Garbe M.D.   On: 09/28/2018 22:54   Mr Jodene Nam Head/brain FY Cm  Result Date: 09/28/2018 CLINICAL DATA:  85 y/o  F; speech difficulty. EXAM: MRI HEAD WITHOUT CONTRAST MRA HEAD WITHOUT CONTRAST TECHNIQUE: Multiplanar, multiecho pulse sequences of the brain and surrounding structures were obtained without intravenous contrast. Angiographic images of the head were obtained using MRA technique without contrast. COMPARISON:  09/28/2018 CT head and CTA head. FINDINGS: MRI HEAD FINDINGS Brain: No acute infarction, hemorrhage, hydrocephalus, extra-axial collection or mass lesion. Partially empty sella turcica. Large confluent nonspecific T2 FLAIR hyperintensities in subcortical and periventricular white matter are compatible with advanced chronic microvascular ischemic  changes. Moderate volume loss of the brain. Small chronic infarcts are present in bifrontal periventricular white matter. Small cortical infarcts are present in the right superior frontal lobe and the left occipital lobe. Very small chronic infarcts are present within the inferior cerebellar hemispheres bilateral as well as the bilateral lentiform nuclei. There is hemosiderin staining of the lentiform nuclei infarcts. Vascular: Normal flow voids. Skull and upper cervical spine: Normal marrow signal. Hyperostosis frontalis interna.  Sinuses/Orbits: Negative. Other: Bilateral intra-ocular lens replacement. MRA HEAD FINDINGS Internal carotid arteries: Patent. Mild non stenotic lumen irregularity compatible with atherosclerosis. Anterior cerebral arteries:  Patent. Middle cerebral arteries: Patent. Anterior communicating artery: Patent. Posterior communicating arteries: Not identified, likely hypoplastic or absent. Posterior cerebral arteries:  Patent. Basilar artery:  Patent. Vertebral arteries:  Patent. No evidence of high-grade stenosis, large vessel occlusion, or aneurysm unless noted above. IMPRESSION: MRI head: 1. No acute intracranial abnormality identified. 2. Stable advanced chronic microvascular ischemic changes and moderate volume loss of the brain. Multiple small chronic infarctions are present in bifrontal periventricular white matter, right frontal cortex, left occipital cortex, and bilateral cerebellar hemispheres. MRA head: Patent anterior and posterior intracranial circulation. No large vessel occlusion, aneurysm, or significant stenosis is identified. Electronically Signed   By: Kristine Garbe M.D.   On: 09/28/2018 22:54   Ct Head Code Stroke Wo Contrast  Result Date: 09/28/2018 CLINICAL DATA:  Code stroke.  Slurred speech, aphasia EXAM: CT HEAD WITHOUT CONTRAST TECHNIQUE: Contiguous axial images were obtained from the base of the skull through the vertex without intravenous contrast.  COMPARISON:  CT head 06/04/2018 FINDINGS: Brain: Moderate atrophy. Extensive chronic microvascular ischemic change throughout the white matter. Chronic infarct right frontal lobe. Small chronic infarcts cerebellum bilaterally unchanged. Negative for acute infarct. Negative for hemorrhage or mass. No midline shift. Vascular: Negative for hyperdense vessel. Atherosclerotic disease in the cavernous carotid bilaterally. Skull: Negative Sinuses/Orbits: Negative Other: None ASPECTS ([place] Stroke Program Early CT Score) - Ganglionic level infarction (caudate, lentiform nuclei, internal capsule, insula, M1-M3 cortex): 7 - Supraganglionic infarction (M4-M6 cortex): 3 Total score (0-10 with 10 being normal): 10 IMPRESSION: 1. No acute intracranial abnormality. 2. Atrophy and extensive chronic ischemic change. 3. ASPECTS is 10 4. These results were called by telephone at the time of interpretation on 09/28/2018 at 11:29 am to Dr. Lenise Arena , who verbally acknowledged these results. Electronically Signed   By: Franchot Gallo M.D.   On: 09/28/2018 11:30       Today   Subjective:   Stephanie Harris patient complains of feeling weak Objective:   Blood pressure (!) 116/50, pulse 62, temperature (!) 97.4 F (36.3 C), temperature source Oral, resp. rate 20, weight 43.7 kg, SpO2 98 %.  .  Intake/Output Summary (Last 24 hours) at 10/04/2018 0953 Last data filed at 10/04/2018 0941 Gross per 24 hour  Intake 360 ml  Output 0 ml  Net 360 ml    Exam VITAL SIGNS: Blood pressure (!) 116/50, pulse 62, temperature (!) 97.4 F (36.3 C), temperature source Oral, resp. rate 20, weight 43.7 kg, SpO2 98 %.  GENERAL:  86 y.o.-year-old patient lying in the bed with no acute distress.  EYES: Pupils equal, round, reactive to light and accommodation. No scleral icterus. Extraocular muscles intact.  HEENT: Head atraumatic, normocephalic. Oropharynx and nasopharynx clear.  NECK:  Supple, no jugular venous distention. No  thyroid enlargement, no tenderness.  LUNGS: Normal breath sounds bilaterally, no wheezing, rales,rhonchi or crepitation. No use of accessory muscles of respiration.  CARDIOVASCULAR: S1, S2 normal. No murmurs, rubs, or gallops.  ABDOMEN: Soft, nontender, nondistended. Bowel sounds present. No organomegaly or mass.  EXTREMITIES: No pedal edema, cyanosis, or clubbing.  NEUROLOGIC: Cranial nerves II through XII are intact. Muscle strength 5/5 in all extremities. Sensation intact. Gait not checked.  PSYCHIATRIC: The patient is alert and oriented x 3.  SKIN: No obvious rash, lesion, or ulcer.   Data Review     CBC w  Diff:  Lab Results  Component Value Date   WBC 6.4 10/01/2018   HGB 10.4 (L) 10/01/2018   HGB 9.6 (L) 11/24/2013   HCT 35.1 (L) 10/01/2018   HCT 30.8 (L) 11/24/2013   PLT 107 (L) 10/01/2018   PLT 109 (L) 11/24/2013   LYMPHOPCT 56 09/28/2018   LYMPHOPCT 45.3 11/24/2013   MONOPCT 18 09/28/2018   MONOPCT 16.1 11/24/2013   EOSPCT 1 09/28/2018   EOSPCT 1.4 11/24/2013   BASOPCT 1 09/28/2018   BASOPCT 0.3 11/24/2013   CMP:  Lab Results  Component Value Date   NA 139 10/01/2018   NA 138 11/24/2013   K 3.6 10/01/2018   K 3.6 11/24/2013   CL 105 10/01/2018   CL 105 11/24/2013   CO2 28 10/01/2018   CO2 25 11/24/2013   BUN 21 10/01/2018   BUN 18 11/24/2013   CREATININE 1.08 (H) 10/01/2018   CREATININE 1.32 (H) 11/24/2013   PROT 7.2 09/28/2018   PROT 7.6 11/23/2013   ALBUMIN 4.2 09/28/2018   ALBUMIN 3.6 11/23/2013   BILITOT 2.0 (H) 09/28/2018   BILITOT 1.2 (H) 11/23/2013   ALKPHOS 26 (L) 09/28/2018   ALKPHOS 40 (L) 11/23/2013   AST 20 09/28/2018   AST 32 11/23/2013   ALT 13 09/28/2018   ALT 29 11/23/2013  .  Micro Results Recent Results (from the past 240 hour(s))  Gastrointestinal Panel by PCR , Stool     Status: None   Collection Time: 10/03/18 10:19 AM  Result Value Ref Range Status   Campylobacter species NOT DETECTED NOT DETECTED Final   Plesimonas  shigelloides NOT DETECTED NOT DETECTED Final   Salmonella species NOT DETECTED NOT DETECTED Final   Yersinia enterocolitica NOT DETECTED NOT DETECTED Final   Vibrio species NOT DETECTED NOT DETECTED Final   Vibrio cholerae NOT DETECTED NOT DETECTED Final   Enteroaggregative E coli (EAEC) NOT DETECTED NOT DETECTED Final   Enteropathogenic E coli (EPEC) NOT DETECTED NOT DETECTED Final   Enterotoxigenic E coli (ETEC) NOT DETECTED NOT DETECTED Final   Shiga like toxin producing E coli (STEC) NOT DETECTED NOT DETECTED Final   Shigella/Enteroinvasive E coli (EIEC) NOT DETECTED NOT DETECTED Final   Cryptosporidium NOT DETECTED NOT DETECTED Final   Cyclospora cayetanensis NOT DETECTED NOT DETECTED Final   Entamoeba histolytica NOT DETECTED NOT DETECTED Final   Giardia lamblia NOT DETECTED NOT DETECTED Final   Adenovirus F40/41 NOT DETECTED NOT DETECTED Final   Astrovirus NOT DETECTED NOT DETECTED Final   Norovirus GI/GII NOT DETECTED NOT DETECTED Final   Rotavirus A NOT DETECTED NOT DETECTED Final   Sapovirus (I, II, IV, and V) NOT DETECTED NOT DETECTED Final    Comment: Performed at [facility], [address] [place], [place] [address]  C difficile quick scan w PCR reflex     Status: None   Collection Time: 10/03/18 11:39 AM  Result Value Ref Range Status   C Diff antigen NEGATIVE NEGATIVE Final   C Diff toxin NEGATIVE NEGATIVE Final   C Diff interpretation No C. difficile detected.  Final    Comment: Performed at [facility], [address] [place], [place] [address]        Code Status Orders  (From admission, onward)         Start     Ordered   09/28/18 1716  Full code  Continuous     09/28/18 1715        Code Status History  Date Active Date Inactive Code Status Order ID Comments User Context   03/18/2018 1739 03/22/2018 1539 Full Code [id number]  Steve Rattler, DO Inpatient   11/06/2017 0101 11/09/2017 1704 Full Code [id number]  Amelia Jo, MD  Inpatient   08/11/2017 0733 08/13/2017 2053 Full Code [id number]  Hillary Bow, MD ED   04/28/2016 1820 04/30/2016 1721 Full Code [id number]  [name], MD Inpatient   03/14/2015 1255 03/15/2015 1808 Full Code [id number]  [name], MD Inpatient   01/29/2015 0527 01/30/2015 1724 Full Code [id number]  Harrie Foreman, MD Inpatient          Follow-up Information    [name], MD In 6 days.   Specialty:  Internal Medicine Why:  Appointment Time: @  Contact information: [place] [address] [phone]           Discharge Medications   Allergies as of 10/04/2018      Reactions   Latex Rash   Meperidine    Other reaction(s): Other (See Comments) Other Reaction: CNS Disorder   Penicillins Other (See Comments)   Has patient had a PCN reaction causing immediate rash, facial/tongue/throat swelling, SOB or lightheadedness with hypotension: Unknown Has patient had a PCN reaction causing severe rash involving mucus membranes or skin necrosis: Unknown Has patient had a PCN reaction that required hospitalization: Unknown Has patient had a PCN reaction occurring within the last 10 years: Unknown If all of the above answers are "NO", then may proceed with Cephalosporin use.   Cefuroxime Axetil Nausea And Vomiting   Codeine Nausea And Vomiting, Nausea Only   Propoxyphene Nausea Only   Other reaction(s): Vomiting      Medication List    TAKE these medications   acetaminophen 500 MG tablet Commonly known as:  TYLENOL Take 1,000 mg by mouth every 8 (eight) hours.   amLODipine 5 MG tablet Commonly known as:  NORVASC Take 1 tablet (5 mg total) by mouth daily.   aspirin EC 325 MG tablet Take 1 tablet (325 mg total) by mouth daily.   atorvastatin 40 MG tablet Commonly known as:  LIPITOR Take 0.5 tablets (20 mg total) by mouth daily at 6 PM. What changed:  medication strength   levothyroxine 50 MCG tablet Commonly known as:  SYNTHROID, LEVOTHROID Take 1  tablet (50 mcg total) by mouth daily.   linaclotide 145 MCG Caps capsule Commonly known as:  LINZESS Take 1 capsule (145 mcg total) by mouth daily before breakfast.   lisinopril 2.5 MG tablet Commonly known as:  PRINIVIL,ZESTRIL Take 2 tablets (5 mg total) by mouth daily.   ondansetron 4 MG disintegrating tablet Commonly known as:  ZOFRAN ODT Take 1 tablet (4 mg total) by mouth every 8 (eight) hours as needed for nausea or vomiting.   oxyCODONE 5 MG immediate release tablet Commonly known as:  Oxy IR/ROXICODONE Take 2.5-5 mg by mouth every 6 (six) hours as needed for moderate pain or severe pain.          Total Time in preparing paper work, data evaluation and todays exam - 64 minutes  Dustin Flock M.D on 10/04/2018 at 9:53 AM [place]  [phone]

## 2018-10-04 NOTE — Progress Notes (Signed)
Pt will be discharged to [facility] via EMS. IV discontinued without incident. Report called to [facility] at 1500 and EMS notified after. I will continue to assess.

## 2018-10-04 NOTE — Plan of Care (Signed)
Patient is participating with self care. Patient is incontinent. No complaints of pain. No deficits noted. Patient has weakness.

## 2018-10-05 DIAGNOSIS — D72820 Lymphocytosis (symptomatic): Secondary | ICD-10-CM | POA: Insufficient documentation

## 2018-10-06 DIAGNOSIS — M545 Low back pain: Secondary | ICD-10-CM | POA: Diagnosis not present

## 2018-10-06 DIAGNOSIS — Z8673 Personal history of transient ischemic attack (TIA), and cerebral infarction without residual deficits: Secondary | ICD-10-CM | POA: Diagnosis not present

## 2018-10-06 DIAGNOSIS — R718 Other abnormality of red blood cells: Secondary | ICD-10-CM | POA: Diagnosis not present

## 2018-10-06 DIAGNOSIS — M533 Sacrococcygeal disorders, not elsewhere classified: Secondary | ICD-10-CM | POA: Diagnosis not present

## 2018-10-06 DIAGNOSIS — G459 Transient cerebral ischemic attack, unspecified: Secondary | ICD-10-CM | POA: Diagnosis not present

## 2018-10-06 DIAGNOSIS — S3210XA Unspecified fracture of sacrum, initial encounter for closed fracture: Secondary | ICD-10-CM | POA: Diagnosis not present

## 2018-10-06 DIAGNOSIS — I252 Old myocardial infarction: Secondary | ICD-10-CM | POA: Diagnosis not present

## 2018-10-06 DIAGNOSIS — D509 Iron deficiency anemia, unspecified: Secondary | ICD-10-CM | POA: Diagnosis not present

## 2018-10-06 DIAGNOSIS — Z8249 Family history of ischemic heart disease and other diseases of the circulatory system: Secondary | ICD-10-CM | POA: Diagnosis not present

## 2018-10-06 DIAGNOSIS — R531 Weakness: Secondary | ICD-10-CM | POA: Diagnosis not present

## 2018-10-06 DIAGNOSIS — I10 Essential (primary) hypertension: Secondary | ICD-10-CM | POA: Diagnosis not present

## 2018-10-06 DIAGNOSIS — W19XXXA Unspecified fall, initial encounter: Secondary | ICD-10-CM | POA: Diagnosis not present

## 2018-10-06 DIAGNOSIS — D631 Anemia in chronic kidney disease: Secondary | ICD-10-CM | POA: Diagnosis not present

## 2018-10-06 DIAGNOSIS — N183 Chronic kidney disease, stage 3 (moderate): Secondary | ICD-10-CM | POA: Diagnosis not present

## 2018-10-06 DIAGNOSIS — E039 Hypothyroidism, unspecified: Secondary | ICD-10-CM | POA: Diagnosis not present

## 2018-10-06 DIAGNOSIS — S3210XD Unspecified fracture of sacrum, subsequent encounter for fracture with routine healing: Secondary | ICD-10-CM | POA: Diagnosis not present

## 2018-10-06 DIAGNOSIS — I129 Hypertensive chronic kidney disease with stage 1 through stage 4 chronic kidney disease, or unspecified chronic kidney disease: Secondary | ICD-10-CM | POA: Diagnosis not present

## 2018-10-06 DIAGNOSIS — K5900 Constipation, unspecified: Secondary | ICD-10-CM | POA: Diagnosis not present

## 2018-10-06 DIAGNOSIS — R112 Nausea with vomiting, unspecified: Secondary | ICD-10-CM | POA: Diagnosis not present

## 2018-10-06 DIAGNOSIS — E1122 Type 2 diabetes mellitus with diabetic chronic kidney disease: Secondary | ICD-10-CM | POA: Diagnosis not present

## 2018-10-06 DIAGNOSIS — D72820 Lymphocytosis (symptomatic): Secondary | ICD-10-CM | POA: Diagnosis not present

## 2018-10-06 DIAGNOSIS — M8580 Other specified disorders of bone density and structure, unspecified site: Secondary | ICD-10-CM | POA: Diagnosis not present

## 2018-10-11 ENCOUNTER — Other Ambulatory Visit: Payer: Self-pay | Admitting: *Deleted

## 2018-10-11 NOTE — Patient Outreach (Signed)
[facility]) Care Management  10/11/2018  Stephanie Harris 1931-08-23 [id number]   Facility site visit to [place]. Collaboration meeting with IDT and [facility] concerning patient's progress, discharge plan and potential care management needs. PT states patient is progressing well and will be ready for discharge soon SW states patient is self sufficient and has spouse for transportation and meals. Patient admitted to SNF on 10/04/18 after a hospitalization for TIA. Planned discharge date is 10/15/18 and discharge disposition is home with spouse.  Patient will have [place] at discharge. Patient was evaluated for community based chronic disease management services with [facility] care Management Program as a benefit of patient's Next [employer] Medicare.   Collaboration with patient at the bedside revealed there were no identifiable [facility] care management needs. Patient states husband will provide transportation and meals. Patient stated she is set up with the CAP program which assists her several days a week. Patient states she remains able to organize her own medications. [facility] Care Management services not appropriate at this time. If patient's post SNF discharge needs change made facility discharge planner aware to place [place] Management consult.   For questions please contact:   Jaimon Bugaj RN, [facility] Liaison  [phone]) Business Mobile [phone]) Toll free office

## 2018-10-12 ENCOUNTER — Ambulatory Visit: Payer: Self-pay | Admitting: Adult Health

## 2018-10-17 ENCOUNTER — Telehealth: Payer: Self-pay

## 2018-10-17 ENCOUNTER — Ambulatory Visit: Payer: Self-pay | Admitting: Adult Health

## 2018-10-17 NOTE — Telephone Encounter (Signed)
Gave verbal physical therapy evaluation [phone] to [facility]

## 2018-10-20 ENCOUNTER — Other Ambulatory Visit: Payer: Self-pay

## 2018-10-20 DIAGNOSIS — D631 Anemia in chronic kidney disease: Secondary | ICD-10-CM | POA: Diagnosis not present

## 2018-10-20 DIAGNOSIS — N183 Chronic kidney disease, stage 3 (moderate): Secondary | ICD-10-CM | POA: Diagnosis not present

## 2018-10-20 NOTE — Patient Outreach (Signed)
[facility]) Care Management  10/20/2018  Stephanie Harris 04-05-1931 [id number]   EMMI- General Discharge RED ON EMMI ALERT Day # 1 Date:  10/19/2018 Red Alert Reason:  Questions about discharge papers? Yes  Unfilled prescriptions? Yes   Outreach attempt: no answer.  HIPAA compliant voice message left.   Plan: RN CM will attempt patient again within 4 business day and send letter.  Jone Baseman, RN, MSN [facility] Care Management Care Management Coordinator Direct Line [phone] Toll Free: [phone]  Fax: [phone]

## 2018-10-21 ENCOUNTER — Other Ambulatory Visit: Payer: Self-pay

## 2018-10-21 ENCOUNTER — Encounter: Payer: Self-pay | Admitting: Nurse Practitioner

## 2018-10-21 ENCOUNTER — Ambulatory Visit (INDEPENDENT_AMBULATORY_CARE_PROVIDER_SITE_OTHER): Payer: Medicare Other | Admitting: Nurse Practitioner

## 2018-10-21 VITALS — BP 110/78 | HR 82 | Resp 16 | Ht 60.0 in | Wt 95.0 lb

## 2018-10-21 DIAGNOSIS — R3989 Other symptoms and signs involving the genitourinary system: Secondary | ICD-10-CM

## 2018-10-21 DIAGNOSIS — N390 Urinary tract infection, site not specified: Secondary | ICD-10-CM | POA: Diagnosis not present

## 2018-10-21 DIAGNOSIS — I6523 Occlusion and stenosis of bilateral carotid arteries: Secondary | ICD-10-CM | POA: Diagnosis not present

## 2018-10-21 DIAGNOSIS — E039 Hypothyroidism, unspecified: Secondary | ICD-10-CM

## 2018-10-21 DIAGNOSIS — I639 Cerebral infarction, unspecified: Secondary | ICD-10-CM

## 2018-10-21 MED ORDER — LEVOTHYROXINE SODIUM 50 MCG PO TABS: 50.0000 ug | ORAL_TABLET | Freq: Every day | ORAL | 1 refills | Status: DC

## 2018-10-21 MED ORDER — CIPROFLOXACIN HCL 250 MG PO TABS: 250.0000 mg | ORAL_TABLET | Freq: Two times a day (BID) | ORAL | 0 refills | Status: DC

## 2018-10-21 MED ORDER — PHENAZOPYRIDINE HCL 100 MG PO TABS: 200.0000 mg | ORAL_TABLET | Freq: Three times a day (TID) | ORAL | 0 refills | Status: DC | PRN

## 2018-10-21 NOTE — Progress Notes (Signed)
[facility] [place],  [id number]  Internal MEDICINE  Office Visit Note  Patient Name: Stephanie Harris  [id number]  [id number]  Date of Service: 11/06/2018   Pt is here for recent hospital follow up.    Chief Complaint  Patient presents with  . Hospitalization Follow-up     The patient was hospitalized 09/28/2018, after developing expressive aphasia and weakness. MRI was negative for acute blockage or aneurism. Did show advanced, ischemic microvascular changes, moderate volume loss, as well as multiple, small, chronic infarcts throughout the brain. She developed diarrhea while hospitalized. She was negative for C. Diff and other bacterial and viral GI illness. Her linzess was held while hospitalized. After discharge from the hospital, she spent a few weeks in skilled nursing facility for rehabilitation to increase her strength. She has consulted with neurology. Medication changes are updated in her chart.     Current Medication: Outpatient Encounter Medications as of 10/21/2018  Medication Sig  . acetaminophen (TYLENOL) 500 MG tablet Take 1,000 mg by mouth every 8 (eight) hours.  Marland Kitchen amLODipine (NORVASC) 5 MG tablet Take 1 tablet (5 mg total) by mouth daily.  Marland Kitchen aspirin EC 325 MG tablet Take 1 tablet (325 mg total) by mouth daily.  Marland Kitchen atorvastatin (LIPITOR) 40 MG tablet Take 0.5 tablets (20 mg total) by mouth daily at 6 PM.  . levothyroxine (SYNTHROID, LEVOTHROID) 50 MCG tablet Take 1 tablet (50 mcg total) by mouth daily. Name brand is medically necessary.  Marland Kitchen linaclotide (LINZESS) 145 MCG CAPS capsule Take 1 capsule (145 mcg total) by mouth daily before breakfast.  . lisinopril (PRINIVIL,ZESTRIL) 2.5 MG tablet Take 2 tablets (5 mg total) by mouth daily.  . ondansetron (ZOFRAN ODT) 4 MG disintegrating tablet Take 1 tablet (4 mg total) by mouth every 8 (eight) hours as needed for nausea or vomiting.  . [DISCONTINUED] levothyroxine (SYNTHROID, LEVOTHROID) 50 MCG  tablet Take 1 tablet (50 mcg total) by mouth daily.  . ciprofloxacin (CIPRO) 250 MG tablet Take 1 tablet (250 mg total) by mouth 2 (two) times daily.  Marland Kitchen oxyCODONE (OXY IR/ROXICODONE) 5 MG immediate release tablet Take 2.5-5 mg by mouth every 6 (six) hours as needed for moderate pain or severe pain.  . phenazopyridine (PYRIDIUM) 100 MG tablet Take 2 tablets (200 mg total) by mouth 3 (three) times daily as needed for pain.   No facility-administered encounter medications on file as of 10/21/2018.     Surgical History: Past Surgical History:  Procedure Laterality Date  . BACK SURGERY    . CAROTID ENDARTERECTOMY Left   . CAROTID STENT INSERTION Right    "have 2 stents in there" (03/18/2018)  . CATARACT EXTRACTION W/ INTRAOCULAR LENS  IMPLANT, BILATERAL Bilateral 06/16/2001 - 05/28/2003   +23.5D     22.5D  . CHOLECYSTECTOMY OPEN  1980  . COLONOSCOPY  2010  . CORONARY ANGIOPLASTY WITH STENT PLACEMENT    . EYELID SURGERY Bilateral 2005   BUL BLEPH  . FRACTURE SURGERY    . JOINT REPLACEMENT    . KYPHOPLASTY N/A 08/12/2017   Procedure: KYPHOPLASTY;  Surgeon: Hessie Knows, MD;  Location: [facility] ORS;  Service: Orthopedics;  Laterality: N/A;  . KYPHOPLASTY N/A 11/08/2017   Procedure: Hewitt Shorts;  Surgeon: Hessie Knows, MD;  Location: [facility] ORS;  Service: Orthopedics;  Laterality: N/A;  . PERCUTANEOUS PLACEMENT INTRAVASCULAR STENT CERVICAL CAROTID ARTERY  06/14/2009  . ROTATOR CUFF REPAIR Left   . TOTAL ABDOMINAL HYSTERECTOMY  1978   WITH REMOVAL TUBES & /  OR OVARIES  . TOTAL KNEE ARTHROPLASTY Right     Medical History: Past Medical History:  Diagnosis Date  . Acute bronchiolitis   . Acute pharyngitis   . Allergic rhinitis due to pollen   . Anemia, unspecified    CKD and LGL  . Anxiety state    unspecified  . Arthritis    "knees, legs" (03/18/2018)  . Asthma without status asthmaticus    unspecified  . B12 deficiency   . Beta thalassemia trait   . Carotid artery occlusion   .  Cellulitis and abscess of leg, except foot   . Cervical spondylosis   . Cervicalgia   . Chronic kidney disease   . Chronic lower back pain   . CML (chronic myelocytic leukemia) ([place])    Onc at [facility]  . Complication of anesthesia    "last back surgery they liked to never get me awake" (03/18/2018)  . Coronary artery disease 2009   heart attack with stent  . Coronary atherosclerosis of native coronary artery   . Depressive disorder    not elsewhere classified  . Difficulty in walking   . Disorder of breast, unspecified   . Dizziness and giddiness   . Dysuria   . Esophageal reflux   . Essential hypertension    unspecified  . Family history of adverse reaction to anesthesia    "liked to never get my sister awake" (03/18/2018)  . Head injury    unspecified  . Headache    "q time I have a stroke" (03/18/2018)  . Heart disease   . Hematuria, unspecified   . History of blood transfusion    "had 22 when they found out I had leukemia" (03/18/2018)  . Hypersomnia, unspecified   . Hypertension   . Hypopotassemia   . Hypothyroidism   . ICH (intracerebral hemorrhage) ([place])   . Ill-defined cerebrovascular disease    other  . Insomnia    unspecified  . 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 Leukemia ([place])   . Lower urinary tract infection   . Lumbago   . Mini stroke ([place])    x years  . Mixed hyperlipidemia   . Myocardial infarction ([address]) ~2017  . Nontoxic nodular goiter    unspecified  . Occlusion and stenosis of unspecified carotid artery    without mention of cerebral infarction  . Osteoarthritis   . Osteoporosis   . Other constipation   . Other vitamin B12 deficiency anemias   . Otitis media, unspecified, unspecified ear   . Ovarian failure    unspecified  . Pain in limb   . Panic disorder without agoraphobia   . Peripheral vascular disease ([place])    unspecified  . Phlebitis of left arm   . RA (rheumatoid arthritis) ([place])   . Sigmoid polyp 1998  . Sleep  disturbance    unspecified  . Stroke ([place]) 02/2017   "lots of mini strokes; big one 02/2017; that one made me weak in my knees,; never fully recovered" (03/18/2018)  . Syncope and collapse   . Thalassemia   . TIA (transient ischemic attack)    2000 and 2008 right carotid stent 08/14/2009 on Plavix  . TIA (transient ischemic attack) 03/18/2018  . Transient disorder of initiating or maintaining sleep   . Type II diabetes mellitus ([place])   . Varicose veins of bilateral lower extremities with other complications   . Varicose veins of bilateral lower extremities with other complications   . Vision abnormalities  Family History: Family History  Problem Relation Age of Onset  . Acute myelogenous leukemia Brother   . Anemia Brother   . Coronary artery disease Brother   . Heart disease Brother   . Stroke Brother   . Heart failure Brother   . Stroke Mother   . Anemia Mother   . Heart disease Mother   . Heart failure Mother   . Hypertension Mother   . Osteoarthritis Mother   . Rheum arthritis Mother   . Heart attack Father   . Anemia Sister   . Cataracts Sister   . Osteoarthritis Sister   . Rheum arthritis Sister   . Stroke Sister   . Heart disease Sister   . Thalassemia Sister   . Thalassemia Sister   . Blindness Neg Hx   . Glaucoma Neg Hx   . Macular degeneration Neg Hx   . Strabismus Neg Hx   . Vision loss Neg Hx   . Basal cell carcinoma Neg Hx   . GU problems Neg Hx   . Kidney cancer Neg Hx   . Melanoma Neg Hx   . Kidney disease Neg Hx   . Prostate cancer Neg Hx   . Squamous cell carcinoma Neg Hx     Social History   Socioeconomic History  . Marital status: Married    Spouse name: Not on file  . Number of children: Not on file  . Years of education: Not on file  . Highest education level: Not on file  Occupational History  . Not on file  Social Needs  . Financial resource strain: Not on file  . Food insecurity:    Worry: Not on file    Inability: Not on  file  . Transportation needs:    Medical: Not on file    Non-medical: Not on file  Tobacco Use  . Smoking status: Never Smoker  . Smokeless tobacco: Never Used  Substance and Sexual Activity  . Alcohol use: No  . Drug use: No  . Sexual activity: Never  Lifestyle  . Physical activity:    Days per week: Not on file    Minutes per session: Not on file  . Stress: Not on file  Relationships  . Social connections:    Talks on phone: Not on file    Gets together: Not on file    Attends religious service: Not on file    Active member of club or organization: Not on file    Attends meetings of clubs or organizations: Not on file    Relationship status: Not on file  . Intimate partner violence:    Fear of current or ex partner: Not on file    Emotionally abused: Not on file    Physically abused: Not on file    Forced sexual activity: Not on file  Other Topics Concern  . Not on file  Social History Narrative  . Not on file      Review of Systems  Constitutional: Positive for activity change and fatigue. Negative for appetite change and unexpected weight change.       Improving with ongoing PT and OT.   HENT: Negative for congestion, hearing loss, sinus pain and tinnitus.   Respiratory: Negative for shortness of breath and wheezing.   Cardiovascular: Negative for chest pain, palpitations and leg swelling.       Improved blood pressure   Gastrointestinal: Positive for diarrhea. Negative for constipation, nausea and vomiting.  Improving.  Endocrine: Negative for cold intolerance, heat intolerance, polydipsia and polyuria.  Genitourinary: Negative for dysuria and frequency.  Musculoskeletal: Positive for arthralgias, back pain and gait problem.  Skin: Negative for rash.  Allergic/Immunologic: Negative for environmental allergies.  Neurological: Positive for weakness and headaches. Negative for dizziness.  Hematological: Negative for adenopathy.  Psychiatric/Behavioral:  Positive for dysphoric mood. The patient is nervous/anxious.     Today's Vitals   10/21/18 1056  BP: 110/78  Pulse: 82  Resp: 16  SpO2: 98%  Weight: 95 lb (43.1 kg)  Height: 5' (1.524 m)   Body mass index is 18.55 kg/m.  Physical Exam Vitals signs and nursing note reviewed.  Constitutional:      Appearance: Normal appearance.     Comments: Frail appearing.  HENT:     Head: Normocephalic and atraumatic.     Nose: Nose normal.  Eyes:     Conjunctiva/sclera: Conjunctivae normal.     Pupils: Pupils are equal, round, and reactive to light.  Neck:     Musculoskeletal: Neck supple. Decreased range of motion. Spinous process tenderness and muscular tenderness present.  Cardiovascular:     Rate and Rhythm: Normal rate. Rhythm irregular.     Heart sounds: Murmur present.  Pulmonary:     Effort: Pulmonary effort is normal.     Breath sounds: Normal breath sounds. No wheezing.  Musculoskeletal:        General: No tenderness or deformity.     Comments: Patient using a walker to help with mobility.   Skin:    General: Skin is warm and dry.     Capillary Refill: Capillary refill takes 2 to 3 seconds.  Neurological:     Mental Status: She is alert and oriented to person, place, and time. Mental status is at baseline.     Cranial Nerves: No cranial nerve deficit.  Psychiatric:        Mood and Affect: Mood is anxious.        Speech: Speech normal.        Behavior: Behavior normal.        Thought Content: Thought content normal.        Judgment: Judgment normal.   Assessment/Plan: 1. Cerebrovascular accident (CVA), unspecified mechanism ([place]) Reviewed progress notes, labs, and imaging studies performed while hospitalized. Chronic, advanced microvascular disease with chronic, small areas of ischemia throughout the brain. cill continue to see neurology as well as PT and OT as scheduled.   2. Bilateral carotid artery stenosis Currently stable. Continue to monitor.   3. Urinary  tract infection without hematuria, site unspecified Evidence of UTI in progress notes from admission to rehab facility which has not been treated. Start cipro 250mg  twice daily for 7 days.  - ciprofloxacin (CIPRO) 250 MG tablet; Take 1 tablet (250 mg total) by mouth 2 (two) times daily.  Dispense: 14 tablet; Refill: 0  4. Bladder pain Add pyridium 100mg  which may be taken up to three times daily if needed for bladder pain and spasms.  - phenazopyridine (PYRIDIUM) 100 MG tablet; Take 2 tablets (200 mg total) by mouth 3 (three) times daily as needed for pain.  Dispense: 10 tablet; Refill: 0  5. Acquired hypothyroidism Stable. Continue levothyroxine 87mcg daily.  - levothyroxine (SYNTHROID, LEVOTHROID) 50 MCG tablet; Take 1 tablet (50 mcg total) by mouth daily. Name brand is medically necessary.  Dispense: 90 tablet; Refill: 1    General Counseling: Janazia verbalizes understanding of the findings of todays visit and agrees  with plan of treatment. I have discussed any further diagnostic evaluation that may be needed or ordered today. We also reviewed her medications today. she has been encouraged to call the office with any questions or concerns that should arise related to todays visit.    Counseling:  Cardiac risk factor modification:  1. Control blood pressure. 2. Exercise as prescribed. 3. Follow low sodium, low fat diet. and low fat and low cholestrol diet. 4. Take ASA 81mg  once a day. 5. Restricted calories diet to lose weight.  This patient was seen by Leretha Pol FNP Collaboration with Dr [name] as a part of collaborative care agreement    I have reviewed all medical records from hospital follow up including radiology reports and consults from other physicians. Appropriate follow up diagnostics will be scheduled as needed. Patient/ Family understands the plan of treatment. Time spent 30 minutes.   Dr [name], MD Internal Medicine

## 2018-10-21 NOTE — Patient Outreach (Signed)
[facility]) Care Management  10/21/2018  Stephanie Harris 27-Feb-1931 [id number]   EMMI- general discharge RED ON EMMI ALERT Day # 1 Date:  10/19/2018 Red Alert Reason:  Questions about discharge papers? Yes  Unfilled prescriptions? Yes    Outreach attempt: spoke with patient.  She is able to verify HIPAA.  She states she did have some questions about her medications but she states she handled it.  She states she had an appointment at [facility] yesterday with her cancer doctor.  She also states she has an appointment today with her primary physician today.  She states that her husband will be taking her to the doctor.  Patient declines any further questions, concerns, or needs.     Plan: RN CM will close case.    Jone Baseman, RN, MSN [facility] Care Management Care Management Coordinator Direct Line [phone] Toll Free: [phone]  Fax: [phone]

## 2018-10-24 ENCOUNTER — Telehealth: Payer: Self-pay | Admitting: Nurse Practitioner

## 2018-10-24 DIAGNOSIS — E039 Hypothyroidism, unspecified: Secondary | ICD-10-CM | POA: Diagnosis not present

## 2018-10-24 DIAGNOSIS — E785 Hyperlipidemia, unspecified: Secondary | ICD-10-CM | POA: Diagnosis not present

## 2018-10-24 DIAGNOSIS — N390 Urinary tract infection, site not specified: Secondary | ICD-10-CM | POA: Diagnosis not present

## 2018-10-24 DIAGNOSIS — Z856 Personal history of leukemia: Secondary | ICD-10-CM | POA: Diagnosis not present

## 2018-10-24 DIAGNOSIS — N189 Chronic kidney disease, unspecified: Secondary | ICD-10-CM | POA: Diagnosis not present

## 2018-10-24 DIAGNOSIS — M170 Bilateral primary osteoarthritis of knee: Secondary | ICD-10-CM | POA: Diagnosis not present

## 2018-10-24 DIAGNOSIS — I2510 Atherosclerotic heart disease of native coronary artery without angina pectoris: Secondary | ICD-10-CM | POA: Diagnosis not present

## 2018-10-24 DIAGNOSIS — I129 Hypertensive chronic kidney disease with stage 1 through stage 4 chronic kidney disease, or unspecified chronic kidney disease: Secondary | ICD-10-CM | POA: Diagnosis not present

## 2018-10-24 DIAGNOSIS — Z8673 Personal history of transient ischemic attack (TIA), and cerebral infarction without residual deficits: Secondary | ICD-10-CM | POA: Diagnosis not present

## 2018-10-24 DIAGNOSIS — I252 Old myocardial infarction: Secondary | ICD-10-CM | POA: Diagnosis not present

## 2018-10-24 DIAGNOSIS — K219 Gastro-esophageal reflux disease without esophagitis: Secondary | ICD-10-CM | POA: Diagnosis not present

## 2018-10-24 DIAGNOSIS — I739 Peripheral vascular disease, unspecified: Secondary | ICD-10-CM | POA: Diagnosis not present

## 2018-10-24 DIAGNOSIS — M069 Rheumatoid arthritis, unspecified: Secondary | ICD-10-CM | POA: Diagnosis not present

## 2018-10-24 DIAGNOSIS — E1122 Type 2 diabetes mellitus with diabetic chronic kidney disease: Secondary | ICD-10-CM | POA: Diagnosis not present

## 2018-10-24 DIAGNOSIS — M810 Age-related osteoporosis without current pathological fracture: Secondary | ICD-10-CM | POA: Diagnosis not present

## 2018-10-24 NOTE — Telephone Encounter (Signed)
Verbal orders Given to [place] at [facility] for start of Care for Monday 10/24/18  [phone]

## 2018-10-26 ENCOUNTER — Telehealth: Payer: Self-pay | Admitting: Nurse Practitioner

## 2018-10-26 NOTE — Telephone Encounter (Signed)
Before we do referral, lets have her thyroid panel checked. Unless there are new results, all of the results that I see for her thyroid look good. I have lab slip made up for her on my desk.

## 2018-11-02 ENCOUNTER — Ambulatory Visit: Payer: Self-pay | Admitting: Nurse Practitioner

## 2018-11-02 DIAGNOSIS — I129 Hypertensive chronic kidney disease with stage 1 through stage 4 chronic kidney disease, or unspecified chronic kidney disease: Secondary | ICD-10-CM | POA: Diagnosis not present

## 2018-11-02 DIAGNOSIS — M170 Bilateral primary osteoarthritis of knee: Secondary | ICD-10-CM | POA: Diagnosis not present

## 2018-11-02 DIAGNOSIS — N390 Urinary tract infection, site not specified: Secondary | ICD-10-CM | POA: Diagnosis not present

## 2018-11-02 DIAGNOSIS — M810 Age-related osteoporosis without current pathological fracture: Secondary | ICD-10-CM | POA: Diagnosis not present

## 2018-11-02 DIAGNOSIS — M069 Rheumatoid arthritis, unspecified: Secondary | ICD-10-CM | POA: Diagnosis not present

## 2018-11-02 DIAGNOSIS — E039 Hypothyroidism, unspecified: Secondary | ICD-10-CM | POA: Diagnosis not present

## 2018-11-02 DIAGNOSIS — Z8673 Personal history of transient ischemic attack (TIA), and cerebral infarction without residual deficits: Secondary | ICD-10-CM | POA: Diagnosis not present

## 2018-11-02 DIAGNOSIS — Z856 Personal history of leukemia: Secondary | ICD-10-CM | POA: Diagnosis not present

## 2018-11-02 DIAGNOSIS — N189 Chronic kidney disease, unspecified: Secondary | ICD-10-CM | POA: Diagnosis not present

## 2018-11-02 DIAGNOSIS — I739 Peripheral vascular disease, unspecified: Secondary | ICD-10-CM | POA: Diagnosis not present

## 2018-11-02 DIAGNOSIS — I2510 Atherosclerotic heart disease of native coronary artery without angina pectoris: Secondary | ICD-10-CM | POA: Diagnosis not present

## 2018-11-02 DIAGNOSIS — E1122 Type 2 diabetes mellitus with diabetic chronic kidney disease: Secondary | ICD-10-CM | POA: Diagnosis not present

## 2018-11-02 DIAGNOSIS — E785 Hyperlipidemia, unspecified: Secondary | ICD-10-CM | POA: Diagnosis not present

## 2018-11-02 DIAGNOSIS — K219 Gastro-esophageal reflux disease without esophagitis: Secondary | ICD-10-CM | POA: Diagnosis not present

## 2018-11-02 DIAGNOSIS — I252 Old myocardial infarction: Secondary | ICD-10-CM | POA: Diagnosis not present

## 2018-11-03 DIAGNOSIS — Z8639 Personal history of other endocrine, nutritional and metabolic disease: Secondary | ICD-10-CM | POA: Diagnosis not present

## 2018-11-03 DIAGNOSIS — D72820 Lymphocytosis (symptomatic): Secondary | ICD-10-CM | POA: Diagnosis not present

## 2018-11-03 DIAGNOSIS — D631 Anemia in chronic kidney disease: Secondary | ICD-10-CM | POA: Diagnosis not present

## 2018-11-03 DIAGNOSIS — N183 Chronic kidney disease, stage 3 (moderate): Secondary | ICD-10-CM | POA: Diagnosis not present

## 2018-11-06 DIAGNOSIS — N390 Urinary tract infection, site not specified: Secondary | ICD-10-CM | POA: Insufficient documentation

## 2018-11-06 DIAGNOSIS — E119 Type 2 diabetes mellitus without complications: Secondary | ICD-10-CM | POA: Insufficient documentation

## 2018-11-06 DIAGNOSIS — R3989 Other symptoms and signs involving the genitourinary system: Secondary | ICD-10-CM | POA: Insufficient documentation

## 2018-11-24 ENCOUNTER — Telehealth: Payer: Self-pay

## 2018-11-24 ENCOUNTER — Ambulatory Visit (INDEPENDENT_AMBULATORY_CARE_PROVIDER_SITE_OTHER): Payer: Medicare Other | Admitting: Adult Health

## 2018-11-24 ENCOUNTER — Other Ambulatory Visit: Payer: Self-pay

## 2018-11-24 ENCOUNTER — Encounter: Payer: Self-pay | Admitting: Adult Health

## 2018-11-24 VITALS — BP 152/82 | HR 70 | Temp 97.7°F | Resp 16 | Ht 60.0 in | Wt 99.0 lb

## 2018-11-24 DIAGNOSIS — I10 Essential (primary) hypertension: Secondary | ICD-10-CM | POA: Diagnosis not present

## 2018-11-24 DIAGNOSIS — S0512XA Contusion of eyeball and orbital tissues, left eye, initial encounter: Secondary | ICD-10-CM
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
## 2018-11-24 NOTE — Telephone Encounter (Signed)
Physical therapy called that Stephanie Harris want hol for PT now due to Covid [phone]

## 2018-11-30 ENCOUNTER — Telehealth: Payer: Self-pay | Admitting: Internal Medicine

## 2018-11-30 NOTE — Telephone Encounter (Signed)
Faxed order for adult pullups to [facility] at [phone] DX n39.9

## 2018-12-02 DIAGNOSIS — N183 Chronic kidney disease, stage 3 (moderate): Secondary | ICD-10-CM | POA: Diagnosis not present

## 2018-12-02 DIAGNOSIS — D631 Anemia in chronic kidney disease: Secondary | ICD-10-CM | POA: Diagnosis not present

## 2018-12-02 DIAGNOSIS — D72820 Lymphocytosis (symptomatic): Secondary | ICD-10-CM | POA: Diagnosis not present

## 2018-12-05 ENCOUNTER — Ambulatory Visit: Payer: Self-pay | Admitting: Nurse Practitioner

## 2018-12-06 ENCOUNTER — Other Ambulatory Visit: Payer: Self-pay

## 2018-12-06 ENCOUNTER — Encounter: Payer: Self-pay | Admitting: Nurse Practitioner

## 2018-12-06 ENCOUNTER — Ambulatory Visit (INDEPENDENT_AMBULATORY_CARE_PROVIDER_SITE_OTHER): Payer: Medicare Other | Admitting: Nurse Practitioner

## 2018-12-06 VITALS — BP 145/66 | HR 77 | Temp 96.7°F | Resp 16 | Ht 60.0 in | Wt 98.0 lb

## 2018-12-06 DIAGNOSIS — L989 Disorder of the skin and subcutaneous tissue, unspecified: Secondary | ICD-10-CM

## 2018-12-06 DIAGNOSIS — E162 Hypoglycemia, unspecified: Secondary | ICD-10-CM

## 2018-12-06 DIAGNOSIS — I10 Essential (primary) hypertension: Secondary | ICD-10-CM | POA: Diagnosis not present

## 2018-12-06 DIAGNOSIS — E1165 Type 2 diabetes mellitus with hyperglycemia: Secondary | ICD-10-CM

## 2018-12-06 LAB — GLUCOSE, POCT (MANUAL RESULT ENTRY): POC Glucose: 124 mg/dl — AB (ref 70–99)

## 2018-12-06 MED ORDER — CIPROFLOXACIN HCL 250 MG PO TABS: 250.0000 mg | ORAL_TABLET | Freq: Two times a day (BID) | ORAL | 0 refills | Status: DC

## 2018-12-06 NOTE — Progress Notes (Signed)
[facility] [place], [place] [address]  Internal MEDICINE  Office Visit Note  Patient Name: Stephanie Harris  [id number]  [id number]  Date of Service: 12/06/2018   Pt is here for a sick visit.  Chief Complaint  Patient presents with  . Edema    Right foot has a blister on top pt noticed it on last thursday, she went to bed fine wednesday night woke up and thrusday morning and saw the spot on her foot, has gotten worse everyday since it started.   . Diabetes    pt mentioned her blood sugars has been running high     Fluid filled lesion to superior aspect of right ankle. She states that she woke up with this blister-like lesion on Thursday morning. She does not remember remember getting urned or having any trauma to the area. There is some bruising present distal to the blister. There is redness around perimeter of the blister and it is very tender to palpate. She also states it hurts her foot to put weight on the right leg.        Current Medication:  Outpatient Encounter Medications as of 12/06/2018  Medication Sig  . acetaminophen (TYLENOL) 500 MG tablet Take 1,000 mg by mouth every 8 (eight) hours.  Marland Kitchen amLODipine (NORVASC) 5 MG tablet Take 1 tablet (5 mg total) by mouth daily.  Marland Kitchen aspirin EC 325 MG tablet Take 1 tablet (325 mg total) by mouth daily.  Marland Kitchen atorvastatin (LIPITOR) 40 MG tablet Take 0.5 tablets (20 mg total) by mouth daily at 6 PM.  . levothyroxine (SYNTHROID, LEVOTHROID) 50 MCG tablet Take 1 tablet (50 mcg total) by mouth daily. Name brand is medically necessary.  Marland Kitchen linaclotide (LINZESS) 145 MCG CAPS capsule Take 1 capsule (145 mcg total) by mouth daily before breakfast.  . lisinopril (PRINIVIL,ZESTRIL) 2.5 MG tablet Take 2 tablets (5 mg total) by mouth daily.  . ondansetron (ZOFRAN ODT) 4 MG disintegrating tablet Take 1 tablet (4 mg total) by mouth every 8 (eight) hours as needed for nausea or vomiting.  Marland Kitchen oxyCODONE (OXY IR/ROXICODONE) 5 MG  immediate release tablet Take 2.5-5 mg by mouth every 6 (six) hours as needed for moderate pain or severe pain.  . phenazopyridine (PYRIDIUM) 100 MG tablet Take 2 tablets (200 mg total) by mouth 3 (three) times daily as needed for pain.  . ciprofloxacin (CIPRO) 250 MG tablet Take 1 tablet (250 mg total) by mouth 2 (two) times daily.   No facility-administered encounter medications on file as of 12/06/2018.       Medical History: Past Medical History:  Diagnosis Date  . Acute bronchiolitis   . Acute pharyngitis   . Allergic rhinitis due to pollen   . Anemia, unspecified    CKD and LGL  . Anxiety state    unspecified  . Arthritis    "knees, legs" (03/18/2018)  . Asthma without status asthmaticus    unspecified  . B12 deficiency   . Beta thalassemia trait   . Carotid artery occlusion   . Cellulitis and abscess of leg, except foot   . Cervical spondylosis   . Cervicalgia   . Chronic kidney disease   . Chronic lower back pain   . CML (chronic myelocytic leukemia) ([place])    Onc at [facility]  . Complication of anesthesia    "last back surgery they liked to never get me awake" (03/18/2018)  . Coronary artery disease 2009   heart attack with stent  .  Coronary atherosclerosis of native coronary artery   . Depressive disorder    not elsewhere classified  . Difficulty in walking   . Disorder of breast, unspecified   . Dizziness and giddiness   . Dysuria   . Esophageal reflux   . Essential hypertension    unspecified  . Family history of adverse reaction to anesthesia    "liked to never get my sister awake" (03/18/2018)  . Head injury    unspecified  . Headache    "q time I have a stroke" (03/18/2018)  . Heart disease   . Hematuria, unspecified   . History of blood transfusion    "had 22 when they found out I had leukemia" (03/18/2018)  . Hypersomnia, unspecified   . Hypertension   . Hypopotassemia   . Hypothyroidism   . ICH (intracerebral hemorrhage) ([place])   . Ill-defined  cerebrovascular disease    other  . Insomnia    unspecified  . 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 Leukemia ([place])   . Lower urinary tract infection   . Lumbago   . Mini stroke ([place])    x years  . Mixed hyperlipidemia   . Myocardial infarction ([place]) ~2017  . Nontoxic nodular goiter    unspecified  . Occlusion and stenosis of unspecified carotid artery    without mention of cerebral infarction  . Osteoarthritis   . Osteoporosis   . Other constipation   . Other vitamin B12 deficiency anemias   . Otitis media, unspecified, unspecified ear   . Ovarian failure    unspecified  . Pain in limb   . Panic disorder without agoraphobia   . Peripheral vascular disease ([place])    unspecified  . Phlebitis of left arm   . RA (rheumatoid arthritis) ([place])   . Sigmoid polyp 1998  . Sleep disturbance    unspecified  . Stroke ([place]) 02/2017   "lots of mini strokes; big one 02/2017; that one made me weak in my knees,; never fully recovered" (03/18/2018)  . Syncope and collapse   . Thalassemia   . TIA (transient ischemic attack)    2000 and 2008 right carotid stent 08/14/2009 on Plavix  . TIA (transient ischemic attack) 03/18/2018  . Transient disorder of initiating or maintaining sleep   . Type II diabetes mellitus ([place])   . Varicose veins of bilateral lower extremities with other complications   . Varicose veins of bilateral lower extremities with other complications   . Vision abnormalities      Today's Vitals   12/06/18 1134  BP: (!) 145/66  Pulse: 77  Resp: 16  Temp: (!) 96.7 F (35.9 C)  SpO2: 97%  Weight: 98 lb (44.5 kg)  Height: 5' (1.524 m)   Body mass index is 19.14 kg/m.  Review of Systems  Constitutional: Positive for activity change. Negative for chills, fatigue and unexpected weight change.  HENT: Negative for congestion, postnasal drip, rhinorrhea, sneezing and sore throat.   Respiratory: Negative for cough, chest tightness, shortness of breath and  wheezing.   Cardiovascular: Negative for chest pain and palpitations.       Improved blood pressure.  Gastrointestinal: Negative for abdominal pain, constipation, diarrhea, nausea and vomiting.  Genitourinary: Negative for dysuria and frequency.  Musculoskeletal: Positive for arthralgias. Negative for back pain, joint swelling and neck pain.  Skin: Positive for wound. Negative for rash.       Blister-like lesion on right ankle. Fluid filled. Tender.   Neurological: Positive for  weakness. Negative for tremors and numbness.  Hematological: Negative for adenopathy. Does not bruise/bleed easily.  Psychiatric/Behavioral: Negative for behavioral problems (Depression), sleep disturbance and suicidal ideas. The patient is not nervous/anxious.     Physical Exam Vitals signs and nursing note reviewed.  Constitutional:      General: She is not in acute distress.    Appearance: She is well-developed. She is not diaphoretic.  HENT:     Head: Normocephalic and atraumatic.     Mouth/Throat:     Pharynx: No oropharyngeal exudate.  Eyes:     Pupils: Pupils are equal, round, and reactive to light.  Neck:     Musculoskeletal: Normal range of motion and neck supple.     Thyroid: No thyromegaly.     Vascular: No JVD.     Trachea: No tracheal deviation.  Cardiovascular:     Rate and Rhythm: Normal rate and regular rhythm.     Heart sounds: Normal heart sounds. No murmur. No friction rub. No gallop.   Pulmonary:     Effort: Pulmonary effort is normal. No respiratory distress.     Breath sounds: No wheezing or rales.  Chest:     Chest wall: No tenderness.  Abdominal:     General: Bowel sounds are normal.     Palpations: Abdomen is soft.  Musculoskeletal: Normal range of motion.  Lymphadenopathy:     Cervical: No cervical adenopathy.  Skin:    General: Skin is warm and dry.     Findings: Lesion present.       Neurological:     Mental Status: She is alert and oriented to person, place, and  time. Mental status is at baseline.     Cranial Nerves: No cranial nerve deficit.  Psychiatric:        Behavior: Behavior normal.        Thought Content: Thought content normal.        Judgment: Judgment normal.    Assessment/Plan: 1. Skin lesion of right lower extremity Unclear etiology. Start cipro 250mg  twice daily for 14 days. Urgent referral made to general surgery to have wound incised and drained. Recommend she stay off her foot and keep propped until further evaluation is made . - ciprofloxacin (CIPRO) 250 MG tablet; Take 1 tablet (250 mg total) by mouth 2 (two) times daily.  Dispense: 28 tablet; Refill: 0 - Ambulatory referral to General Surgery  2. Essential hypertension, benign Improved. Continue bp medication as prescribed .  3. Hypoglycemia - POCT Glucose (CBG) non-fasting is 128 today. Continue to monitor.   General Counseling: Stephanie Harris verbalizes understanding of the findings of todays visit and agrees with plan of treatment. I have discussed any further diagnostic evaluation that may be needed or ordered today. We also reviewed her medications today. she has been encouraged to call the office with any questions or concerns that should arise related to todays visit.    Counseling:  This patient was seen by Leretha Pol FNP Collaboration with Dr [name] as a part of collaborative care agreement  Orders Placed This Encounter  Procedures  . Ambulatory referral to General Surgery  . POCT Glucose (CBG)    Meds ordered this encounter  Medications  . ciprofloxacin (CIPRO) 250 MG tablet    Sig: Take 1 tablet (250 mg total) by mouth 2 (two) times daily.    Dispense:  28 tablet    Refill:  0    Order Specific Question:   Supervising Provider  Answer:   [name] [[id number]]    Time spent: 25 Minutes

## 2018-12-06 NOTE — Progress Notes (Signed)
Pt blood pressure elevated, informed provider. 

## 2018-12-08 ENCOUNTER — Encounter: Payer: Self-pay | Admitting: Surgery

## 2018-12-08 ENCOUNTER — Other Ambulatory Visit: Payer: Self-pay

## 2018-12-08 ENCOUNTER — Ambulatory Visit (INDEPENDENT_AMBULATORY_CARE_PROVIDER_SITE_OTHER): Payer: Medicare Other | Admitting: Surgery

## 2018-12-08 VITALS — BP 110/68 | HR 72 | Temp 97.2°F | Ht 60.0 in | Wt 98.0 lb

## 2018-12-08 DIAGNOSIS — L989 Disorder of the skin and subcutaneous tissue, unspecified: Secondary | ICD-10-CM

## 2018-12-08 DIAGNOSIS — S90521D Blister (nonthermal), right ankle, subsequent encounter: Secondary | ICD-10-CM

## 2018-12-08 MED ORDER — SILVER SULFADIAZINE 1 % EX CREA: TOPICAL_CREAM | CUTANEOUS | 0 refills | Status: DC

## 2018-12-08 NOTE — Progress Notes (Signed)
Surgical Clinic History and Physical  Referring provider:  [name], MD [place], [place] [address]  HISTORY OF PRESENT ILLNESS (HPI):  85 y.o. female presents for evaluation of her painful and enlarged fluid-filled ankle lesion. Patient reports she first noted the lesion upon awaking from sleep 1 week ago, since which time it's progressively increased in size and pain every day until last night the lesion "burst" open and drained fluid the color and consistency of "orange lime-aid", since which it remains sore, but with less pressure. She says it was not present the night before she noticed it the next morning and denies any recent Right ankle trauma. Since developing this lesion, she has avoided wearing shoes that might rub (including what she describes as "new sneakers"). She has experienced chronic B/L Left > Right lower extremity and foot edema. She also denies prior similar lesions, cramping calf pain with walking (though she doesn't walk much since having experienced a major stroke following multiple TIA's), toe pain or wounds, and otherwise denies fever/chills, N/V, CP, or SOB.  PAST MEDICAL HISTORY (PMH):  Past Medical History:  Diagnosis Date  . Acute bronchiolitis   . Acute pharyngitis   . Allergic rhinitis due to pollen   . Anemia, unspecified    CKD and LGL  . Anxiety state    unspecified  . Arthritis    "knees, legs" (03/18/2018)  . Asthma without status asthmaticus    unspecified  . B12 deficiency   . Beta thalassemia trait   . Carotid artery occlusion   . Cellulitis and abscess of leg, except foot   . Cervical spondylosis   . Cervicalgia   . Chronic kidney disease   . Chronic lower back pain   . CML (chronic myelocytic leukemia) ([place])    Onc at [facility]  . Complication of anesthesia    "last back surgery they liked to never get me awake" (03/18/2018)  . Coronary artery disease 2009   heart attack with stent  . Coronary atherosclerosis of native  coronary artery   . Depressive disorder    not elsewhere classified  . Difficulty in walking   . Disorder of breast, unspecified   . Dizziness and giddiness   . Dysuria   . Esophageal reflux   . Essential hypertension    unspecified  . Family history of adverse reaction to anesthesia    "liked to never get my sister awake" (03/18/2018)  . Head injury    unspecified  . Headache    "q time I have a stroke" (03/18/2018)  . Heart disease   . Hematuria, unspecified   . History of blood transfusion    "had 22 when they found out I had leukemia" (03/18/2018)  . Hypersomnia, unspecified   . Hypertension   . Hypopotassemia   . Hypothyroidism   . ICH (intracerebral hemorrhage) ([place])   . Ill-defined cerebrovascular disease    other  . Insomnia    unspecified  . 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 Leukemia ([place])   . Lower urinary tract infection   . Lumbago   . Mini stroke ([place])    x years  . Mixed hyperlipidemia   . Myocardial infarction ([place]) ~2017  . Nontoxic nodular goiter    unspecified  . Occlusion and stenosis of unspecified carotid artery    without mention of cerebral infarction  . Osteoarthritis   . Osteoporosis   . Other constipation   . Other vitamin B12 deficiency anemias   .  Otitis media, unspecified, unspecified ear   . Ovarian failure    unspecified  . Pain in limb   . Panic disorder without agoraphobia   . Peripheral vascular disease ([place])    unspecified  . Phlebitis of left arm   . RA (rheumatoid arthritis) ([place])   . Sigmoid polyp 1998  . Sleep disturbance    unspecified  . Stroke ([place]) 02/2017   "lots of mini strokes; big one 02/2017; that one made me weak in my knees,; never fully recovered" (03/18/2018)  . Syncope and collapse   . Thalassemia   . TIA (transient ischemic attack)    2000 and 2008 right carotid stent 08/14/2009 on Plavix  . TIA (transient ischemic attack) 03/18/2018  . Transient disorder of initiating or maintaining  sleep   . Type II diabetes mellitus ([place])   . Varicose veins of bilateral lower extremities with other complications   . Varicose veins of bilateral lower extremities with other complications   . Vision abnormalities     PAST SURGICAL HISTORY [facility]):  Past Surgical History:  Procedure Laterality Date  . BACK SURGERY    . CAROTID ENDARTERECTOMY Left   . CAROTID STENT INSERTION Right    "have 2 stents in there" (03/18/2018)  . CATARACT EXTRACTION W/ INTRAOCULAR LENS  IMPLANT, BILATERAL Bilateral 06/16/2001 - 05/28/2003   +23.5D     22.5D  . CHOLECYSTECTOMY OPEN  1980  . COLONOSCOPY  2010  . CORONARY ANGIOPLASTY WITH STENT PLACEMENT    . EYELID SURGERY Bilateral 2005   BUL BLEPH  . FRACTURE SURGERY    . JOINT REPLACEMENT    . KYPHOPLASTY N/A 08/12/2017   Procedure: KYPHOPLASTY;  Surgeon: Hessie Knows, MD;  Location: [facility] ORS;  Service: Orthopedics;  Laterality: N/A;  . KYPHOPLASTY N/A 11/08/2017   Procedure: Hewitt Shorts;  Surgeon: Hessie Knows, MD;  Location: [facility] ORS;  Service: Orthopedics;  Laterality: N/A;  . PERCUTANEOUS PLACEMENT INTRAVASCULAR STENT CERVICAL CAROTID ARTERY  06/14/2009  . ROTATOR CUFF REPAIR Left   . TOTAL ABDOMINAL HYSTERECTOMY  1978   WITH REMOVAL TUBES & /OR OVARIES  . TOTAL KNEE ARTHROPLASTY Right     MEDICATIONS:  Prior to Admission medications   Medication Sig Start Date End Date Taking? Authorizing Provider  acetaminophen (TYLENOL) 500 MG tablet Take 1,000 mg by mouth every 8 (eight) hours. 07/07/18  Yes [provider]  amLODipine (NORVASC) 5 MG tablet Take 1 tablet (5 mg total) by mouth daily. 10/04/18  Yes Dustin Flock, MD  aspirin EC 325 MG tablet Take 1 tablet (325 mg total) by mouth daily. 10/03/18 11/07/19 Yes Dustin Flock, MD  atorvastatin (LIPITOR) 40 MG tablet Take 0.5 tablets (20 mg total) by mouth daily at 6 PM. 10/03/18  Yes Dustin Flock, MD  ciprofloxacin (CIPRO) 250 MG tablet Take 1 tablet (250 mg total) by mouth 2 (two)  times daily. 12/06/18  Yes Boscia, Greer Ee, NP  levothyroxine (SYNTHROID, LEVOTHROID) 50 MCG tablet Take 1 tablet (50 mcg total) by mouth daily. Name brand is medically necessary. 10/21/18  Yes Ronnell Freshwater, NP  linaclotide (LINZESS) 145 MCG CAPS capsule Take 1 capsule (145 mcg total) by mouth daily before breakfast. 08/30/18  Yes Boscia, Heather E, NP  lisinopril (PRINIVIL,ZESTRIL) 2.5 MG tablet Take 2 tablets (5 mg total) by mouth daily. 10/04/18  Yes Dustin Flock, MD  ondansetron (ZOFRAN ODT) 4 MG disintegrating tablet Take 1 tablet (4 mg total) by mouth every 8 (eight) hours as needed for nausea or vomiting.  09/16/18  Yes Boscia, Greer Ee, NP  oxyCODONE (OXY IR/ROXICODONE) 5 MG immediate release tablet Take 2.5-5 mg by mouth every 6 (six) hours as needed for moderate pain or severe pain.   Yes [provider]  phenazopyridine (PYRIDIUM) 100 MG tablet Take 2 tablets (200 mg total) by mouth 3 (three) times daily as needed for pain. 10/21/18  Yes Ronnell Freshwater, NP  silver sulfADIAZINE (SILVADENE) 1 % cream Apply to affected area daily 12/08/18 12/08/19  Vickie Epley, MD    ALLERGIES:  Allergies  Allergen Reactions  . Latex Rash  . Meperidine     Other reaction(s): Other (See Comments) Other Reaction: CNS Disorder  . Penicillins Other (See Comments)    Has patient had a PCN reaction causing immediate rash, facial/tongue/throat swelling, SOB or lightheadedness with hypotension: Unknown Has patient had a PCN reaction causing severe rash involving mucus membranes or skin necrosis: Unknown Has patient had a PCN reaction that required hospitalization: Unknown Has patient had a PCN reaction occurring within the last 10 years: Unknown If all of the above answers are "NO", then may proceed with Cephalosporin use.   . Cefuroxime Axetil Nausea And Vomiting  . Codeine Nausea And Vomiting and Nausea Only  . Propoxyphene Nausea Only    Other reaction(s): Vomiting    SOCIAL HISTORY:   Social History   Socioeconomic History  . Marital status: Married    Spouse name: Not on file  . Number of children: Not on file  . Years of education: Not on file  . Highest education level: Not on file  Occupational History  . Not on file  Social Needs  . Financial resource strain: Not on file  . Food insecurity:    Worry: Not on file    Inability: Not on file  . Transportation needs:    Medical: Not on file    Non-medical: Not on file  Tobacco Use  . Smoking status: Never Smoker  . Smokeless tobacco: Never Used  Substance and Sexual Activity  . Alcohol use: No  . Drug use: No  . Sexual activity: Never  Lifestyle  . Physical activity:    Days per week: Not on file    Minutes per session: Not on file  . Stress: Not on file  Relationships  . Social connections:    Talks on phone: Not on file    Gets together: Not on file    Attends religious service: Not on file    Active member of club or organization: Not on file    Attends meetings of clubs or organizations: Not on file    Relationship status: Not on file  . Intimate partner violence:    Fear of current or ex partner: Not on file    Emotionally abused: Not on file    Physically abused: Not on file    Forced sexual activity: Not on file  Other Topics Concern  . Not on file  Social History Narrative  . Not on file    The patient currently resides (home / rehab facility / nursing home): Home The patient normally is (ambulatory / bedbound): Limited ambulation s/p stroke  FAMILY HISTORY:  Family History  Problem Relation Age of Onset  . Acute myelogenous leukemia Brother   . Anemia Brother   . Coronary artery disease Brother   . Heart disease Brother   . Stroke Brother   . Heart failure Brother   . Stroke Mother   . Anemia Mother   .  Heart disease Mother   . Heart failure Mother   . Hypertension Mother   . Osteoarthritis Mother   . Rheum arthritis Mother   . Heart attack Father   . Anemia Sister    . Cataracts Sister   . Osteoarthritis Sister   . Rheum arthritis Sister   . Stroke Sister   . Heart disease Sister   . Thalassemia Sister   . Thalassemia Sister   . Blindness Neg Hx   . Glaucoma Neg Hx   . Macular degeneration Neg Hx   . Strabismus Neg Hx   . Vision loss Neg Hx   . Basal cell carcinoma Neg Hx   . GU problems Neg Hx   . Kidney cancer Neg Hx   . Melanoma Neg Hx   . Kidney disease Neg Hx   . Prostate cancer Neg Hx   . Squamous cell carcinoma Neg Hx     Otherwise negative/non-contributory.  REVIEW OF SYSTEMS:  Constitutional: denies any other weight loss, fever, chills, or sweats  Eyes: denies any other vision changes, history of eye injury  ENT: denies sore throat, hearing problems  Respiratory: denies shortness of breath, wheezing  Cardiovascular: denies chest pain, palpitations  Gastrointestinal: denies abdominal pain, N/V, or diarrhea Musculoskeletal: denies any other joint pains or cramps  Skin: Denies any other rashes or skin discolorations except as per HPI Neurological: denies any other headache, dizziness, weakness  Psychiatric: Denies any other depression, anxiety   All other review of systems were otherwise negative   VITAL SIGNS:  BP 110/68   Pulse 72   Temp (!) 97.2 F (36.2 C) (Skin)   Ht 5' (1.524 m)   Wt 98 lb (44.5 kg)   SpO2 97%   BMI 19.14 kg/m   PHYSICAL EXAM:  Constitutional:  -- Normal body habitus  -- Awake, alert, and oriented x3  Eyes:  -- Pupils equally round and reactive to light  -- No scleral icterus  Ear, nose, throat:  -- No jugular venous distension -- No nasal drainage, bleeding Pulmonary:  -- No crackles  -- Equal breath sounds bilaterally -- Breathing non-labored at rest Cardiovascular:  -- S1, S2 present  -- No pericardial rubs  Gastrointestinal:  -- Abdomen soft, non-tender to palpation, non-distended, no guarding/rebound tenderness -- No abdominal masses appreciated, pulsatile or otherwise   Musculoskeletal and Integumentary:  -- Wounds or skin discoloration: 3 cm x 3 cm round ruptured anterior Right ankle blister with irregularly redundant skin overlying former blister site without surrounding erythema, fluctuance, or purulent drainage -- Extremities: B/L UE and LE FROM, hands and feet warm, 2+ Left > 1+ Right pitting edema  Neurologic:  -- Motor function: Intact and symmetric -- Sensation: Intact and symmetric  Pulse/Doppler Exam:  (p=palpable; d=doppler signals; 0=none)     Right   Left   Fem  p   p   DP  p   p   Labs:  CBC Latest Ref Rng & Units 10/01/2018 09/30/2018 09/29/2018  WBC 4.0 - 10.5 K/uL 6.4 6.7 6.7  Hemoglobin 12.0 - 15.0 g/dL 10.4(L) 10.4(L) 10.6(L)  Hematocrit 36.0 - 46.0 % 35.1(L) 35.5(L) 35.5(L)  Platelets 150 - 400 K/uL 107(L) 96(L) 89(L)   CMP Latest Ref Rng & Units 10/01/2018 09/30/2018 09/29/2018  Glucose 70 - 99 mg/dL 96 101(H) 96  BUN 8 - 23 mg/dL 21 21 17   Creatinine 0.44 - 1.00 mg/dL 1.08(H) 1.27(H) 1.07(H)  Sodium 135 - 145 mmol/L 139 139 138  Potassium 3.5 -  5.1 mmol/L 3.6 3.6 4.0  Chloride 98 - 111 mmol/L 105 105 105  CO2 22 - 32 mmol/L 28 27 26   Calcium 8.9 - 10.3 mg/dL 9.7 9.7 9.7  Total Protein 6.5 - 8.1 g/dL - - -  Total Bilirubin 0.3 - 1.2 mg/dL - - -  Alkaline Phos 38 - 126 U/L - - -  AST 15 - 41 U/L - - -  ALT 0 - 44 U/L - - -   Imaging studies: No recent pertinent imaging studies available for review   Assessment/Plan: Patient Active Problem List   Diagnosis Date Noted  . Skin lesion of right lower extremity 12/06/2018  . Diabetes mellitus without complication ([place]) 25/63/8937  . Urinary tract infection without hematuria 11/06/2018  . Bladder pain 11/06/2018  . Large granular lymphocytosis 10/05/2018  . Hypoglycemia 08/31/2018  . Nausea 08/31/2018  . B12 deficiency 08/31/2018  . Mini stroke ([place]) 08/30/2018  . Acquired hypothyroidism 07/11/2018  . Dyslipidemia 07/11/2018  . Chronic renal disease, stage III ([place]) 07/11/2018   . Sacral fracture, closed ([place]) 07/11/2018  . Traumatic hematoma of buttock 07/11/2018  . Uses walker 07/07/2018  . Frequent falls 07/02/2018  . Sacral fracture, closed ([place]) 07/01/2018  . Traumatic hematoma of buttock 07/01/2018  . At risk for domestic violence 05/06/2018  . Pain in right foot 04/06/2018  . Cerebrovascular accident (CVA) ([place]) 04/05/2018  . Essential hypertension, benign 04/05/2018  . Carotid artery calcification, bilateral 04/05/2018  . Occlusion and stenosis of both vertebral arteries   . Stenosis of carotid artery   . Hypertensive emergency   . S/P kyphoplasty 12/16/2017  . Age-related osteoporosis with current pathological fracture 11/10/2017  . Protein-calorie malnutrition, severe 11/08/2017  . Lumbar compression fracture ([place]) 11/05/2017  . Spondylosis of lumbar region without myelopathy or radiculopathy 10/08/2017  . Chronic midline low back pain 08/29/2017  . Closed wedge compression fracture of first lumbar vertebra with delayed healing 08/29/2017  . Anemia in stage 3 chronic kidney disease ([place]) 06/02/2017  . Left-sided nontraumatic intracerebral hemorrhage ([place]) 03/23/2017  . Primary localized osteoarthrosis of shoulder, left 12/31/2016  . Primary osteoarthritis of one knee, left 12/31/2016  . Closed fracture of proximal end of left humerus with routine healing 08/28/2016  . Gross hematuria 06/25/2016  . Incomplete emptying of bladder 06/25/2016  . Moderate mitral insufficiency 10/31/2015  . Syncope and collapse 10/31/2015  . Continuous leakage of urine 08/30/2015  . Frequent UTI 08/30/2015  . Muscle weakness of lower extremity 03/20/2015  . Chest pain 03/14/2015  . TIA (transient ischemic attack) 01/29/2015  . Allergic arthritis, hand   . Adhesive capsulitis of left shoulder 12/13/2014  . SI joint arthritis 07/05/2014  . Mixed hyperlipidemia 05/22/2014  . Temporary cerebral vascular dysfunction 05/22/2014  . Irritable bowel syndrome with  constipation 05/03/2014  . Dysphagia 05/03/2014  . Weight loss 05/03/2014  . Bilateral carotid artery disease ([place]) 04/18/2014  . Carotid atherosclerosis 04/18/2014  . Cerebral infarction, unspecified ([place]) 04/18/2014  . Cerebral artery occlusion with cerebral infarction ([place]) 01/11/2014  . Coronary atherosclerosis 01/11/2014  . Closed fracture of lumbar vertebra ([place]) 12/19/2013  . Non-traumatic compression fracture of second lumbar vertebra ([place]) 12/19/2013  . Fall in home 05/19/2013  . Compression fracture of T12 vertebra ([place]) 04/27/2013  . Closed fracture of dorsal (thoracic) vertebra ([place]) 04/27/2013  . 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 Anemia due to stage 3 chronic kidney disease ([place]) 06/15/2012  . Lymphoid leukemia ([place]) 06/15/2012  . Unspecified  visual loss 05/20/2012  . Degenerative drusen 05/06/2012  . Drusen, retina 05/06/2012   85 y.o. female with a disrupted anterior Right ankle blister without signs of infection, likely attributable to unrecognized trauma or rubbing (footwear or otherwise), complicated by advanced chronological age and above comorbidities.   - no need for oral systemic antibiotics at this time  - excess skin overlying ruptured Right anterior ankle blister debrided  - okay to shower and/or bath with soap and water, but avoid tight-fitting  - silvadene applied and prescribed with non-absorbent non-stick adhesive dressing applied and to be changed once daily  - elevate B/L lower extremities to minimize edema, may consider compression stockings, and avoid shoes that may rub  - return to clinic as needed, instructed to call if any questions or concerns  All of the above recommendations were discussed with the patient, and all of patient's questions were answered to her expressed satisfaction.  Thank you for the opportunity to participate in this patient's care.  -- Marilynne Drivers Rosana Hoes, MD, [facility]: [facility] - Partnering for exceptional care. Office: [phone]

## 2018-12-08 NOTE — Patient Instructions (Addendum)
Return as needed. The patient is aware to call back for any questions or concerns. Rx cream sent. Apply once a day .

## 2018-12-13 ENCOUNTER — Ambulatory Visit: Payer: Self-pay | Admitting: Internal Medicine

## 2018-12-16 DIAGNOSIS — N183 Chronic kidney disease, stage 3 (moderate): Secondary | ICD-10-CM | POA: Diagnosis not present

## 2018-12-16 DIAGNOSIS — D631 Anemia in chronic kidney disease: Secondary | ICD-10-CM | POA: Diagnosis not present

## 2018-12-16 IMAGING — XA DG C-ARM 61-120 MIN
1 series · 1 of 1 positions shown · non-contrast
Comparison: Lumbar MRI 11/05/2017. thoracic and lumbar spine
radiographs 11/05/2017.

CLINICAL DATA: 86-year-old female undergoing kyphoplasty. Prior
compression fractures.

EXAM:
LUMBAR SPINE - 2-3 VIEW; DG C-ARM 61-120 MIN

[Series 1: cont. · 1 of 1 slices shown]
[im 1/1]
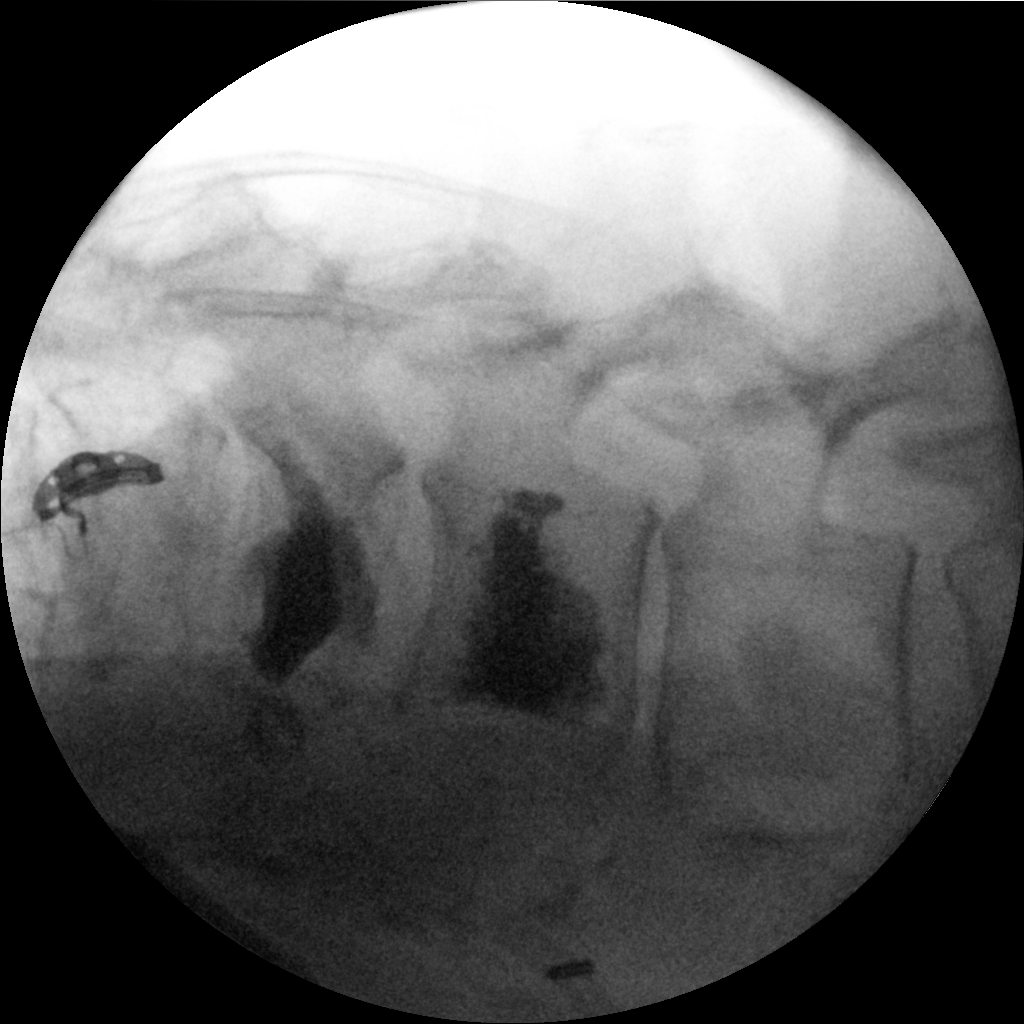

[1 of 1 positions shown; findings below may reference images not displayed]

FINDINGS: Eleven pairs of ribs are again noted, the same numbering system is
used as on the 11/05/2017 thoracic radiograph designating absent
ribs at T12.

There are chronic T11 and T12 compression fractures, previous
augmentation of T12.

Two intraoperative fluoroscopic spot views demonstrate interval
kyphoplasty of the level subjacent to the previously augmented level
(L1). Mild superior endplate compression of that level
re-demonstrated. By this numbering system, the L5 level is
sacralized.
IMPRESSION: Transitional spinal anatomy with 11 pairs of ribs and 5 lumbar type
vertebral bodies.

I designate the T12 level as having absent ribs, and therefore a
sacralized L5 level.

Kyphoplasty of the vertebral body subjacent to the previously
augmented level for treatment of mild superior endplate compression
fracture as demonstrated on MRI 11/05/2017.

## 2018-12-21 ENCOUNTER — Other Ambulatory Visit: Payer: Self-pay | Admitting: *Deleted

## 2018-12-21 NOTE — Patient Outreach (Signed)
[facility]) Care Management  12/21/2018  Stephanie Harris [birth year] [id number]   Initial Outreach: 12/21/2018  RN attempted the initial outreach however unsuccessful. RN able to leave a HIPAA approved voice message requesting a call back. Will further engage on needs at that time.  Will follow up within 4 days with another outreach call.  Raina Mina, RN Care Management Coordinator [place] Office [phone]

## 2018-12-27 ENCOUNTER — Other Ambulatory Visit: Payer: Self-pay | Admitting: *Deleted

## 2018-12-27 NOTE — Patient Outreach (Signed)
[facility]) Care Management  12/27/2018  Stephanie Harris [birth year] [id number]    NexGen Referral  RN received a call back from pt via voice message requesting RN to follow up with a call to her tomorrow after 1100. RN scheduled a 1130 follow call and will inquire further on pt's needs at that time.  Raina Mina, RN Care Management Coordinator [place] Office [phone]

## 2018-12-27 NOTE — Patient Outreach (Signed)
[place] [facility]) Care Management  12/27/2018  Stephanie Harris 1931-01-18 [id number]  NexGen Referral 2nd Outreach attempt-Unsuccessful  RN attempted another outreach however unsuccessful. RN able to leave a HIPAA approved voice message requesting a call back. Will further engage on needs at that time.  Will follow up within 4 business days with another outreach call.  Raina Mina, RN Care Management Coordinator [place] Office [phone]

## 2018-12-28 ENCOUNTER — Encounter: Payer: Self-pay | Admitting: *Deleted

## 2018-12-28 ENCOUNTER — Other Ambulatory Visit: Payer: Self-pay | Admitting: *Deleted

## 2018-12-28 NOTE — Patient Outreach (Signed)
[facility]) Care Management  12/28/2018  KOSISOCHUKWU GOLDBERG 10-29-1930 [id number]  NEXGEN-Quarterly HC for HTN   RN spoke with pt today at the requested time for RN to call back. RN verified identifiers and introduced the [facility] program and services. Pt receptive as RN inquired further on the purpose for today's call. Discuss pt's current medical history related to HF/RF and HTN. Pt reports having two strokes in the past and monitors her BP with a home device with stable readings at this time. Report she is currently with is working with [facility] PT from a recent right foot surgery several months ago. Provider is aware of ongoing swelling and pt has refused to wear a compression stocking.  Pt reports Leukemia and receives treatment from [facility].  RN able to completed the initial assessment and offered month case management services related to managing any of her ongoing medical conditions. Pt states most are under control however receptive to HTN management. RN offered to follow up monthly however pt was admant and declined the monthly calls. RN also offered quarterly follow up with placement into the HTN program. Pt receptive to quarterly with a HC. RN offered to complete the initial follow up in August prior to going to the [facility] as pt very receptive. RN discussed the HTN program and educated pt on HTN and the FAST acronym. Will also discuss printer material that RN will mail out to pt to review for her ongoing management of care related to HTN and stroke prevention. A plan of care was generated around HTN and discussed with pt today. Pt receptive and willing to adhere to this plan. Will notify pt's provider of pt's disposition with [facility] services. RN introduce other disciplines available with [place] and social workers if needed in the future. The following plan of care will be re-evaluated in 3 months prior to transfer case to the [facility] based upon the pt's request for quarterly follow  up on her HTN. No other request or inquires at this time.   THN CM Care Plan Problem One     Most Recent Value  Care Plan Problem One  Knowledge related to HTN  Role Documenting the Problem One  Care Management Coordinator  Care Plan for Problem One  Active  THN Long Term Goal   Pt will verbalize no acute episodes of elevated readings over the next 90 days.  THN Long Term Goal Start Date  12/28/18  Interventions for Problem One Long Term Goal  WIll educate on hypertension and what to do if acute symptoms should occurr. Will provide printed material for pt to review for ongoing prevention measures.   THN CM Short Term Goal #1   Pt will obtain a weekly BP check with her home device and recognize abnormal readings to report to her provider in the next 30 days.  THN CM Short Term Goal #1 Start Date  12/28/18  Interventions for Short Term Goal #1  Will educate on normal readings, FAST and discussed abnormal symptoms and what to do if acute.    THN CM Short Term Goal #2   Pt will continue to consume a heart healthy diet with low sodium in the next 30 days.  THN CM Short Term Goal #2 Start Date  12/28/18  Interventions for Short Term Goal #2  Will discuss the importance of following a heart healthy diet with a salt smart diet to reduce the risk of strokes and/or cardiac incidents. Will provide printed  material again on the disease for ongoing management of care.       Raina Mina, RN Care Management Coordinator [place] Office [phone]

## 2019-01-02 ENCOUNTER — Telehealth: Payer: Self-pay

## 2019-01-02 ENCOUNTER — Ambulatory Visit: Payer: Self-pay | Admitting: *Deleted

## 2019-01-02 DIAGNOSIS — N183 Chronic kidney disease, stage 3 (moderate): Secondary | ICD-10-CM | POA: Diagnosis not present

## 2019-01-02 DIAGNOSIS — D72820 Lymphocytosis (symptomatic): Secondary | ICD-10-CM | POA: Diagnosis not present

## 2019-01-02 DIAGNOSIS — D631 Anemia in chronic kidney disease: Secondary | ICD-10-CM | POA: Diagnosis not present

## 2019-01-02 NOTE — Telephone Encounter (Signed)
Faxed under pads to [facility] [phone]

## 2019-01-04 ENCOUNTER — Telehealth: Payer: Self-pay | Admitting: Surgery

## 2019-01-04 NOTE — Telephone Encounter (Signed)
Patient states that she has had swollen feet for the past week and her left ankle bone is hurting her and does not look right. No problems with surgical areas done in April. Patient advised to contact her primary care provider for further work up.

## 2019-01-04 NOTE — Telephone Encounter (Signed)
Patient is calling said her feet are both swollen said she can't stand to put any weight on it, patient said she's in pain, please call patient and advise.

## 2019-01-09 ENCOUNTER — Other Ambulatory Visit: Payer: Self-pay

## 2019-01-09 DIAGNOSIS — E039 Hypothyroidism, unspecified: Secondary | ICD-10-CM

## 2019-01-09 MED ORDER — LEVOTHYROXINE SODIUM 50 MCG PO TABS: 50.0000 ug | ORAL_TABLET | Freq: Every day | ORAL | 1 refills | Status: DC

## 2019-01-12 DIAGNOSIS — R609 Edema, unspecified: Secondary | ICD-10-CM | POA: Diagnosis not present

## 2019-01-12 DIAGNOSIS — R600 Localized edema: Secondary | ICD-10-CM | POA: Insufficient documentation

## 2019-01-12 DIAGNOSIS — D631 Anemia in chronic kidney disease: Secondary | ICD-10-CM | POA: Diagnosis not present

## 2019-01-12 DIAGNOSIS — N183 Chronic kidney disease, stage 3 (moderate): Secondary | ICD-10-CM | POA: Diagnosis not present

## 2019-01-26 DIAGNOSIS — C91Z0 Other lymphoid leukemia not having achieved remission: Secondary | ICD-10-CM | POA: Diagnosis not present

## 2019-01-26 DIAGNOSIS — D696 Thrombocytopenia, unspecified: Secondary | ICD-10-CM | POA: Diagnosis not present

## 2019-01-26 DIAGNOSIS — E876 Hypokalemia: Secondary | ICD-10-CM | POA: Diagnosis not present

## 2019-01-26 DIAGNOSIS — N183 Chronic kidney disease, stage 3 (moderate): Secondary | ICD-10-CM | POA: Diagnosis not present

## 2019-01-26 DIAGNOSIS — D631 Anemia in chronic kidney disease: Secondary | ICD-10-CM | POA: Diagnosis not present

## 2019-01-26 DIAGNOSIS — D72820 Lymphocytosis (symptomatic): Secondary | ICD-10-CM | POA: Diagnosis not present

## 2019-02-03 ENCOUNTER — Encounter: Payer: Self-pay | Admitting: Emergency Medicine

## 2019-02-03 ENCOUNTER — Other Ambulatory Visit: Payer: Self-pay

## 2019-02-03 ENCOUNTER — Emergency Department
Admission: EM | Admit: 2019-02-03 | Discharge: 2019-02-03 | Disposition: A | Discharge status: Home / Self Care | Payer: Medicare Other | Attending: Emergency Medicine | Admitting: Emergency Medicine

## 2019-02-03 ENCOUNTER — Ambulatory Visit: Payer: Medicare Other | Admitting: Nurse Practitioner

## 2019-02-03 ENCOUNTER — Ambulatory Visit: Payer: Medicare Other | Admitting: Cardiothoracic Surgery

## 2019-02-03 DIAGNOSIS — Z856 Personal history of leukemia: Secondary | ICD-10-CM | POA: Diagnosis not present

## 2019-02-03 DIAGNOSIS — S99921A Unspecified injury of right foot, initial encounter: Secondary | ICD-10-CM | POA: Diagnosis present

## 2019-02-03 DIAGNOSIS — I252 Old myocardial infarction: Secondary | ICD-10-CM | POA: Diagnosis not present

## 2019-02-03 DIAGNOSIS — Z7982 Long term (current) use of aspirin: Secondary | ICD-10-CM | POA: Insufficient documentation

## 2019-02-03 DIAGNOSIS — Y939 Activity, unspecified: Secondary | ICD-10-CM | POA: Diagnosis not present

## 2019-02-03 DIAGNOSIS — Z79899 Other long term (current) drug therapy: Secondary | ICD-10-CM | POA: Diagnosis not present

## 2019-02-03 DIAGNOSIS — E1122 Type 2 diabetes mellitus with diabetic chronic kidney disease: Secondary | ICD-10-CM | POA: Diagnosis not present

## 2019-02-03 DIAGNOSIS — S90822A Blister (nonthermal), left foot, initial encounter: Secondary | ICD-10-CM | POA: Diagnosis not present

## 2019-02-03 DIAGNOSIS — Z91040 Latex allergy status: Secondary | ICD-10-CM | POA: Diagnosis not present

## 2019-02-03 DIAGNOSIS — I129 Hypertensive chronic kidney disease with stage 1 through stage 4 chronic kidney disease, or unspecified chronic kidney disease: Secondary | ICD-10-CM | POA: Insufficient documentation

## 2019-02-03 DIAGNOSIS — S90522A Blister (nonthermal), left ankle, initial encounter: Secondary | ICD-10-CM | POA: Insufficient documentation

## 2019-02-03 DIAGNOSIS — N183 Chronic kidney disease, stage 3 (moderate): Secondary | ICD-10-CM | POA: Insufficient documentation

## 2019-02-03 DIAGNOSIS — Z955 Presence of coronary angioplasty implant and graft: Secondary | ICD-10-CM | POA: Diagnosis not present

## 2019-02-03 DIAGNOSIS — S90821A Blister (nonthermal), right foot, initial encounter: Secondary | ICD-10-CM | POA: Diagnosis not present

## 2019-02-03 DIAGNOSIS — Y929 Unspecified place or not applicable: Secondary | ICD-10-CM | POA: Diagnosis not present

## 2019-02-03 DIAGNOSIS — Z96651 Presence of right artificial knee joint: Secondary | ICD-10-CM | POA: Insufficient documentation

## 2019-02-03 DIAGNOSIS — Y998 Other external cause status: Secondary | ICD-10-CM | POA: Diagnosis not present

## 2019-02-03 DIAGNOSIS — X58XXXA Exposure to other specified factors, initial encounter: Secondary | ICD-10-CM | POA: Diagnosis not present

## 2019-02-03 DIAGNOSIS — E039 Hypothyroidism, unspecified: Secondary | ICD-10-CM | POA: Diagnosis not present

## 2019-02-03 LAB — CBC WITH DIFFERENTIAL/PLATELET
Abs Immature Granulocytes: 0.03 10*3/uL (ref 0.00–0.07)
Basophils Absolute: 0 10*3/uL (ref 0.0–0.1)
Basophils Relative: 1 %
Eosinophils Absolute: 0.2 10*3/uL (ref 0.0–0.5)
Eosinophils Relative: 4 %
HCT: 32.6 % — ABNORMAL LOW (ref 36.0–46.0)
Hemoglobin: 9.8 g/dL — ABNORMAL LOW (ref 12.0–15.0)
Immature Granulocytes: 1 %
Lymphocytes Relative: 48 %
Lymphs Abs: 3.1 10*3/uL (ref 0.7–4.0)
MCH: 18.6 pg — ABNORMAL LOW (ref 26.0–34.0)
MCHC: 30.1 g/dL (ref 30.0–36.0)
MCV: 61.7 fL — ABNORMAL LOW (ref 80.0–100.0)
Monocytes Absolute: 1.1 10*3/uL — ABNORMAL HIGH (ref 0.1–1.0)
Monocytes Relative: 18 %
Neutro Abs: 1.8 10*3/uL (ref 1.7–7.7)
Neutrophils Relative %: 28 %
Platelets: 140 10*3/uL — ABNORMAL LOW (ref 150–400)
RBC: 5.28 MIL/uL — ABNORMAL HIGH (ref 3.87–5.11)
RDW: 18 % — ABNORMAL HIGH (ref 11.5–15.5)
Smear Review: DECREASED
WBC: 6.4 10*3/uL (ref 4.0–10.5)
nRBC: 0 % (ref 0.0–0.2)

## 2019-02-03 LAB — BASIC METABOLIC PANEL
Anion gap: 10 (ref 5–15)
BUN: 15 mg/dL (ref 8–23)
CO2: 26 mmol/L (ref 22–32)
Calcium: 9.6 mg/dL (ref 8.9–10.3)
Chloride: 105 mmol/L (ref 98–111)
Creatinine, Ser: 1.08 mg/dL — ABNORMAL HIGH (ref 0.44–1.00)
GFR calc Af Amer: 54 mL/min — ABNORMAL LOW (ref >60)
GFR calc non Af Amer: 46 mL/min — ABNORMAL LOW (ref >60)
Glucose, Bld: 107 mg/dL — ABNORMAL HIGH (ref 70–99)
Potassium: 3.2 mmol/L — ABNORMAL LOW (ref 3.5–5.1)
Sodium: 141 mmol/L (ref 135–145)

## 2019-02-03 LAB — PATHOLOGIST SMEAR REVIEW

## 2019-02-03 LAB — BRAIN NATRIURETIC PEPTIDE: B Natriuretic Peptide: 466 pg/mL — ABNORMAL HIGH (ref 0.0–100.0)

## 2019-02-03 MED ORDER — MUPIROCIN CALCIUM 2 % EX CREA: TOPICAL_CREAM | CUTANEOUS | 0 refills | Status: DC

## 2019-02-03 MED ORDER — DOXYCYCLINE HYCLATE 100 MG PO TBEC: 100.0000 mg | DELAYED_RELEASE_TABLET | Freq: Two times a day (BID) | ORAL | 0 refills | Status: AC

## 2019-02-03 NOTE — ED Notes (Signed)
Patient given meal tray and water. OK per Dr. Joni Fears.

## 2019-02-03 NOTE — ED Provider Notes (Signed)
[facility] Emergency Department Provider Note  ____________________________________________   First MD Initiated Contact with Patient 02/03/19 1337     (approximate)  I have reviewed the triage vital signs and the nursing notes.   HISTORY  Chief Complaint Blister    HPI Stephanie Harris is a 85 y.o. female here with blister to the anterior aspect of bilateral legs.  The patient has a history of leukemia, previous atraumatic blister of her right foot, for which she followed up with Dr. Rosana Hoes, he here with bilateral foot ulcers.  The patient states that over the last 24 hours, she noticed swelling along the dorsum of her right foot.  She states that she noticed a small red rim around the area of swelling, followed by formation of a tense blister.  She has minimal pain at the site.  Denies any streaking redness.  No induration.  No pain with palpation.  She also noticed a small area on the left dorsal foot.  Denies any new shoes, socks, or other exposures.  No fevers or chills.  She is otherwise been well.  This did happen once in the past, and she saw her oncologist as well as general surgery, and it was treated with local wound care.  She was well prior to the onset of the swelling.  Nursing medication change.  No ulcers noted in the mouth or other areas of her body.        Past Medical History:  Diagnosis Date   Acute bronchiolitis    Acute pharyngitis    Allergic rhinitis due to pollen    Anemia, unspecified    CKD and LGL   Anxiety state    unspecified   Arthritis    "knees, legs" (03/18/2018)   Asthma without status asthmaticus    unspecified   B12 deficiency    Beta thalassemia trait    Carotid artery occlusion    Cellulitis and abscess of leg, except foot    Cervical spondylosis    Cervicalgia    Chronic kidney disease    Chronic lower back pain    CML (chronic myelocytic leukemia) ([place])    Onc at [facility]   Complication of  anesthesia    "last back surgery they liked to never get me awake" (03/18/2018)   Coronary artery disease 2009   heart attack with stent   Coronary atherosclerosis of native coronary artery    Depressive disorder    not elsewhere classified   Difficulty in walking    Disorder of breast, unspecified    Dizziness and giddiness    Dysuria    Esophageal reflux    Essential hypertension    unspecified   Family history of adverse reaction to anesthesia    "liked to never get my sister awake" (03/18/2018)   Head injury    unspecified   Headache    "q time I have a stroke" (03/18/2018)   Heart disease    Hematuria, unspecified    History of blood transfusion    "had 22 when they found out I had leukemia" (03/18/2018)   Hypersomnia, unspecified    Hypertension    Hypopotassemia    Hypothyroidism    ICH (intracerebral hemorrhage) ([place])    Ill-defined cerebrovascular disease    other   Insomnia    unspecified   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   Leukemia ([place])    Lower urinary tract infection    Lumbago    Mini stroke (  [place])    x years   Mixed hyperlipidemia    Myocardial infarction ([place]) ~2017   Nontoxic nodular goiter    unspecified   Occlusion and stenosis of unspecified carotid artery    without mention of cerebral infarction   Osteoarthritis    Osteoporosis    Other constipation    Other vitamin B12 deficiency anemias    Otitis media, unspecified, unspecified ear    Ovarian failure    unspecified   Pain in limb    Panic disorder without agoraphobia    Peripheral vascular disease ([place])    unspecified   Phlebitis of left arm    RA (rheumatoid arthritis) ([place])    Sigmoid polyp 1998   Sleep disturbance    unspecified   Stroke ([place]) 02/2017   "lots of mini strokes; big one 02/2017; that one made me weak in my knees,; never fully recovered" (03/18/2018)   Syncope and collapse    Thalassemia    TIA (transient  ischemic attack)    2000 and 2008 right carotid stent 08/14/2009 on Plavix   TIA (transient ischemic attack) 03/18/2018   Transient disorder of initiating or maintaining sleep    Type II diabetes mellitus ([place])    Varicose veins of bilateral lower extremities with other complications    Varicose veins of bilateral lower extremities with other complications    Vision abnormalities     Patient Active Problem List   Diagnosis Date Noted   Skin lesion of right lower extremity 12/06/2018   Diabetes mellitus without complication ([place]) 93/79/0240   Urinary tract infection without hematuria 11/06/2018   Bladder pain 11/06/2018   Large granular lymphocytosis 10/05/2018   Hypoglycemia 08/31/2018   Nausea 08/31/2018   B12 deficiency 08/31/2018   Mini stroke ([place]) 08/30/2018   Acquired hypothyroidism 07/11/2018   Dyslipidemia 07/11/2018   Chronic renal disease, stage III ([place]) 07/11/2018   Sacral fracture, closed ([place]) 07/11/2018   Traumatic hematoma of buttock 07/11/2018   Uses walker 07/07/2018   Frequent falls 07/02/2018   Sacral fracture, closed ([place]) 07/01/2018   Traumatic hematoma of buttock 07/01/2018   At risk for domestic violence 05/06/2018   Pain in right foot 04/06/2018   Cerebrovascular accident (CVA) ([place]) 04/05/2018   Essential hypertension, benign 04/05/2018   Carotid artery calcification, bilateral 04/05/2018   Occlusion and stenosis of both vertebral arteries    Stenosis of carotid artery    Hypertensive emergency    S/P kyphoplasty 12/16/2017   Age-related osteoporosis with current pathological fracture 11/10/2017   Protein-calorie malnutrition, severe 11/08/2017   Lumbar compression fracture ([place]) 11/05/2017   Spondylosis of lumbar region without myelopathy or radiculopathy 10/08/2017   Chronic midline low back pain 08/29/2017   Closed wedge compression fracture of first lumbar vertebra with delayed healing 08/29/2017    Anemia in stage 3 chronic kidney disease ([place]) 06/02/2017   Left-sided nontraumatic intracerebral hemorrhage ([place]) 03/23/2017   Primary localized osteoarthrosis of shoulder, left 12/31/2016   Primary osteoarthritis of one knee, left 12/31/2016   Closed fracture of proximal end of left humerus with routine healing 08/28/2016   Gross hematuria 06/25/2016   Incomplete emptying of bladder 06/25/2016   Moderate mitral insufficiency 10/31/2015   Syncope and collapse 10/31/2015   Continuous leakage of urine 08/30/2015   Frequent UTI 08/30/2015   Muscle weakness of lower extremity 03/20/2015   Chest pain 03/14/2015   TIA (transient ischemic attack) 01/29/2015   Allergic arthritis, hand    Adhesive capsulitis of left shoulder  12/13/2014   SI joint arthritis 07/05/2014   Mixed hyperlipidemia 05/22/2014   Temporary cerebral vascular dysfunction 05/22/2014   Irritable bowel syndrome with constipation 05/03/2014   Dysphagia 05/03/2014   Weight loss 05/03/2014   Bilateral carotid artery disease ([place]) 04/18/2014   Carotid atherosclerosis 04/18/2014   Cerebral infarction, unspecified ([place]) 04/18/2014   Cerebral artery occlusion with cerebral infarction ([place]) 01/11/2014   Coronary atherosclerosis 01/11/2014   Closed fracture of lumbar vertebra ([place]) 12/19/2013   Non-traumatic compression fracture of second lumbar vertebra ([place]) 12/19/2013   Fall in home 05/19/2013   Compression fracture of T12 vertebra ([place]) 04/27/2013   Closed fracture of dorsal (thoracic) vertebra ([place]) 04/27/2013   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   Anemia due to stage 3 chronic kidney disease ([place]) 06/15/2012   Lymphoid leukemia ([place]) 06/15/2012   Unspecified visual loss 05/20/2012   Degenerative drusen 05/06/2012   Drusen, retina 05/06/2012    Past Surgical History:  Procedure Laterality Date   BACK SURGERY     CAROTID ENDARTERECTOMY Left    CAROTID  STENT INSERTION Right    "have 2 stents in there" (03/18/2018)   CATARACT EXTRACTION W/ INTRAOCULAR LENS  IMPLANT, BILATERAL Bilateral 06/16/2001 - 05/28/2003   +23.5D     22.5D   CHOLECYSTECTOMY OPEN  1980   COLONOSCOPY  2010   CORONARY ANGIOPLASTY WITH STENT PLACEMENT     EYELID SURGERY Bilateral 2005   BUL BLEPH   FRACTURE SURGERY     JOINT REPLACEMENT     KYPHOPLASTY N/A 08/12/2017   Procedure: KYPHOPLASTY;  Surgeon: Hessie Knows, MD;  Location: [facility] ORS;  Service: Orthopedics;  Laterality: N/A;   KYPHOPLASTY N/A 11/08/2017   Procedure: Hewitt Shorts;  Surgeon: Hessie Knows, MD;  Location: [facility] ORS;  Service: Orthopedics;  Laterality: N/A;   PERCUTANEOUS PLACEMENT INTRAVASCULAR STENT CERVICAL CAROTID ARTERY  06/14/2009   ROTATOR CUFF REPAIR Left    TOTAL ABDOMINAL HYSTERECTOMY  1978   WITH REMOVAL TUBES & /OR OVARIES   TOTAL KNEE ARTHROPLASTY Right     Prior to Admission medications   Medication Sig Start Date End Date Taking? Authorizing Provider  acetaminophen (TYLENOL) 500 MG tablet Take 1,000 mg by mouth every 8 (eight) hours. 07/07/18   [provider]  amLODipine (NORVASC) 5 MG tablet Take 1 tablet (5 mg total) by mouth daily. 10/04/18   Dustin Flock, MD  aspirin 81 MG chewable tablet Chew 81 mg by mouth daily.    [provider]  aspirin EC 325 MG tablet Take 1 tablet (325 mg total) by mouth daily. Patient not taking: Reported on 12/28/2018 10/03/18 11/07/19  Dustin Flock, MD  atorvastatin (LIPITOR) 40 MG tablet Take 0.5 tablets (20 mg total) by mouth daily at 6 PM. 10/03/18   Dustin Flock, MD  ciprofloxacin (CIPRO) 250 MG tablet Take 1 tablet (250 mg total) by mouth 2 (two) times daily. Patient not taking: Reported on 12/28/2018 12/06/18   Ronnell Freshwater, NP  doxycycline (DORYX) 100 MG EC tablet Take 1 tablet (100 mg total) by mouth 2 (two) times daily for 7 days. 02/03/19 02/10/19  Duffy Bruce, MD  levothyroxine (SYNTHROID) 50 MCG  tablet Take 1 tablet (50 mcg total) by mouth daily. Name brand is medically necessary. 01/09/19   Ronnell Freshwater, NP  linaclotide Rolan Lipa) 145 MCG CAPS capsule Take 1 capsule (145 mcg total) by mouth daily before breakfast. Patient not taking: Reported on 12/28/2018 08/30/18   Ronnell Freshwater, NP  lisinopril (PRINIVIL,ZESTRIL) 2.5  MG tablet Take 2 tablets (5 mg total) by mouth daily. 10/04/18   Dustin Flock, MD  mupirocin cream Drue Stager) 2 % Apply over any open areas of skin of bilateral feet twice a day for 7-10 days, or until healed 02/03/19 02/03/20  Duffy Bruce, MD  ondansetron (ZOFRAN ODT) 4 MG disintegrating tablet Take 1 tablet (4 mg total) by mouth every 8 (eight) hours as needed for nausea or vomiting. 09/16/18   Ronnell Freshwater, NP  oxyCODONE (OXY IR/ROXICODONE) 5 MG immediate release tablet Take 2.5-5 mg by mouth every 6 (six) hours as needed for moderate pain or severe pain.    [provider]  phenazopyridine (PYRIDIUM) 100 MG tablet Take 2 tablets (200 mg total) by mouth 3 (three) times daily as needed for pain. Patient not taking: Reported on 12/28/2018 10/21/18   Ronnell Freshwater, NP  silver sulfADIAZINE (SILVADENE) 1 % cream Apply to affected area daily Patient not taking: Reported on 12/28/2018 12/08/18 12/08/19  Vickie Epley, MD    Allergies Latex, Meperidine, Penicillins, Cefuroxime axetil, Codeine, and Propoxyphene  Family History  Problem Relation Age of Onset   Acute myelogenous leukemia Brother    Anemia Brother    Coronary artery disease Brother    Heart disease Brother    Stroke Brother    Heart failure Brother    Stroke Mother    Anemia Mother    Heart disease Mother    Heart failure Mother    Hypertension Mother    Osteoarthritis Mother    Rheum arthritis Mother    Heart attack Father    Anemia Sister    Cataracts Sister    Osteoarthritis Sister    Rheum arthritis Sister    Stroke Sister    Heart disease Sister     Thalassemia Sister    Thalassemia Sister    Blindness Neg Hx    Glaucoma Neg Hx    Macular degeneration Neg Hx    Strabismus Neg Hx    Vision loss Neg Hx    Basal cell carcinoma Neg Hx    GU problems Neg Hx    Kidney cancer Neg Hx    Melanoma Neg Hx    Kidney disease Neg Hx    Prostate cancer Neg Hx    Squamous cell carcinoma Neg Hx     Social History Social History   Tobacco Use   Smoking status: Never Smoker   Smokeless tobacco: Never Used  Substance Use Topics   Alcohol use: No   Drug use: No    Review of Systems    Review of Systems  Constitutional: Negative for chills, fatigue and fever.  HENT: Negative for congestion and rhinorrhea.   Eyes: Negative for visual disturbance.  Respiratory: Negative for cough and shortness of breath.   Cardiovascular: Negative for chest pain and leg swelling.  Gastrointestinal: Negative for abdominal pain, nausea and vomiting.  Musculoskeletal: Negative for neck pain.  Skin: Positive for rash and wound.  Neurological: Negative for weakness and headaches.  All other systems reviewed and are negative.    ____________________________________________  PHYSICAL EXAM:      VITAL SIGNS: ED Triage Vitals  Enc Vitals Group     BP 02/03/19 1251 (!) 161/100     Pulse Rate 02/03/19 1251 78     Resp --      Temp 02/03/19 1251 97.9 F (36.6 C)     Temp Source 02/03/19 1251 Oral     SpO2 02/03/19 1251 100 %  Weight 02/03/19 1251 98 lb (44.5 kg)     Height 02/03/19 1251 5' (1.524 m)     Head Circumference --      Peak Flow --      Pain Score 02/03/19 1307 8     Pain Loc --      Pain Edu? --      Excl. in [place]? --      Physical Exam Vitals signs and nursing note reviewed.  Constitutional:      General: She is not in acute distress.    Appearance: She is well-developed.  HENT:     Head: Normocephalic and atraumatic.  Eyes:     Conjunctiva/sclera: Conjunctivae normal.  Neck:     Musculoskeletal: Neck  supple.  Cardiovascular:     Rate and Rhythm: Normal rate and regular rhythm.     Heart sounds: Normal heart sounds. No murmur. No friction rub.  Pulmonary:     Effort: Pulmonary effort is normal. No respiratory distress.     Breath sounds: Normal breath sounds. No wheezing or rales.  Abdominal:     General: There is no distension.     Palpations: Abdomen is soft.     Tenderness: There is no abdominal tenderness.  Skin:    General: Skin is warm.     Capillary Refill: Capillary refill takes less than 2 seconds.  Neurological:     Mental Status: She is alert and oriented to person, place, and time.     Motor: No abnormal muscle tone.      LOWER EXTREMITY EXAM: BILATERAL  INSPECTION & PALPATION: Tense, serous fluid-filled blisters noted to dorsum of right foot and left ankle, with no induration or fluctuance. No streaking erythema.   SENSORY: sensation is intact to light touch in:  Superficial peroneal nerve distribution (over dorsum of foot) Deep peroneal nerve distribution (over first dorsal web space) Sural nerve distribution (over lateral aspect 5th metatarsal) Saphenous nerve distribution (over medial instep)  MOTOR:  + Motor EHL (great toe dorsiflexion) + FHL (great toe plantar flexion)  + TA (ankle dorsiflexion)  + GSC (ankle plantar flexion)  VASCULAR: 2+ dorsalis pedis and posterior tibialis pulses Capillary refill < 2 sec, toes warm and well-perfused  COMPARTMENTS: Soft, warm, well-perfused No pain with passive extension No parethesias  ____________________________________________   LABS (all labs ordered are listed, but only abnormal results are displayed)  Labs Reviewed  CBC WITH DIFFERENTIAL/PLATELET - Abnormal; Notable for the following components:      Result Value   RBC 5.28 (*)    Hemoglobin 9.8 (*)    HCT 32.6 (*)    MCV 61.7 (*)    MCH 18.6 (*)    RDW 18.0 (*)    Platelets 140 (*)    Monocytes Absolute 1.1 (*)    All other components  within normal limits  BASIC METABOLIC PANEL - Abnormal; Notable for the following components:   Potassium 3.2 (*)    Glucose, Bld 107 (*)    Creatinine, Ser 1.08 (*)    GFR calc non Af Amer 46 (*)    GFR calc Af Amer 54 (*)    All other components within normal limits  BRAIN NATRIURETIC PEPTIDE  PATHOLOGIST SMEAR REVIEW    ____________________________________________  EKG: NA ________________________________________  RADIOLOGY All imaging, including plain films, CT scans, and ultrasounds, independently reviewed by me, and interpretations confirmed via formal radiology reads.  ED MD interpretation:   None  Official radiology report(s): No results found.  ____________________________________________  PROCEDURES   Procedure(s) performed (including Critical Care):  Procedures  ____________________________________________  INITIAL IMPRESSION / MDM / [place] / ED COURSE  As part of my medical decision making, I reviewed the following data within the electronic MEDICAL RECORD NUMBER Notes from prior ED visits and [place] Controlled Substance Database      *DENNISE RAABE was evaluated in Emergency Department on 02/03/2019 for the symptoms described in the history of present illness. She was evaluated in the context of the global COVID-19 pandemic, which necessitated consideration that the patient might be at risk for infection with the SARS-CoV-2 virus that causes COVID-19. Institutional protocols and algorithms that pertain to the evaluation of patients at risk for COVID-19 are in a state of rapid change based on information released by regulatory bodies including the CDC and federal and state organizations. These policies and algorithms were followed during the patient's care in the ED.  Some ED evaluations and interventions may be delayed as a result of limited staffing during the pandemic.*      Medical Decision Making: 85 yo F here with superficial, atraumatic  ulcerations to bilateral feet. See images above. Blisters cleansed and drained with sterile 20g needle. Tolerated well. Sterile dressing/Xeroform applied. Clinically, these would fit with bullous pemphigoid though contact edema is also a consideration. She has on evidence of significant superimposed cellulitis, abscess, or infection. She is not on chemotherapy or neutropenic. Will f/u labs. I suspect she can be managed with outpt steroids, topical wound care, and referral to her Dermatologist and wound care. Pt would prefer outpt management based on shared decision making. Sign out to Dr. Joni Fears. ____________________________________________  FINAL CLINICAL IMPRESSION(S) / ED DIAGNOSES  Final diagnoses:  Blister (nonthermal), right foot, initial encounter  Blister of left ankle, initial encounter     MEDICATIONS GIVEN DURING THIS VISIT:  Medications - No data to display   ED Discharge Orders         Ordered    doxycycline (DORYX) 100 MG EC tablet  2 times daily     02/03/19 1532    mupirocin cream (BACTROBAN) 2 %     02/03/19 1532           Note:  This document was prepared using Dragon voice recognition software and may include unintentional dictation errors.   Duffy Bruce, MD 02/03/19 [phone]

## 2019-02-03 NOTE — Discharge Instructions (Signed)
Apply a clean, non-stick dressing to your wound daily, then cover the wounds  Apply the antibiotic ointment to any open areas of blister/skin breakdown twice a day  Take the antibiotic as prescribed  Try to elevate your legs when resting, above the level of your heart  Avoid tight fitting shoes

## 2019-02-03 NOTE — ED Notes (Signed)
Spoke with pt, she is going to call someone to come pick her up. Pt does not drive. Husband was admitted today.

## 2019-02-03 NOTE — ED Provider Notes (Signed)
-----------------------------------------   6:00 PM on 02/03/2019 -----------------------------------------   Labs all unremarkable. Vitals unremarkable.  Stable for DC as per Dr. Ellender Hose' plan .   [name], MD 02/03/19 Stephanie Harris

## 2019-02-03 NOTE — ED Triage Notes (Signed)
Pt to ED from home c/o blisters.  Two to top of right foot and one to top of left ankle.  Patient states came up yesterday.  Has had one prior and removed by surgical center but were unable to test fluid.  States tight and painful, no itching, denies fevers.  States hx of leukemia.

## 2019-02-05 ENCOUNTER — Other Ambulatory Visit: Payer: Self-pay

## 2019-02-05 ENCOUNTER — Emergency Department
Admission: EM | Admit: 2019-02-05 | Discharge: 2019-02-05 | Disposition: A | Discharge status: Home / Self Care | Payer: Medicare Other | Attending: Emergency Medicine | Admitting: Emergency Medicine

## 2019-02-05 ENCOUNTER — Encounter: Payer: Self-pay | Admitting: Emergency Medicine

## 2019-02-05 DIAGNOSIS — Y9389 Activity, other specified: Secondary | ICD-10-CM | POA: Insufficient documentation

## 2019-02-05 DIAGNOSIS — Y9289 Other specified places as the place of occurrence of the external cause: Secondary | ICD-10-CM | POA: Insufficient documentation

## 2019-02-05 DIAGNOSIS — T148XXA Other injury of unspecified body region, initial encounter: Secondary | ICD-10-CM

## 2019-02-05 DIAGNOSIS — S90821A Blister (nonthermal), right foot, initial encounter: Secondary | ICD-10-CM | POA: Diagnosis not present

## 2019-02-05 DIAGNOSIS — X58XXXA Exposure to other specified factors, initial encounter: Secondary | ICD-10-CM | POA: Insufficient documentation

## 2019-02-05 DIAGNOSIS — S90822A Blister (nonthermal), left foot, initial encounter: Secondary | ICD-10-CM | POA: Insufficient documentation

## 2019-02-05 DIAGNOSIS — Y998 Other external cause status: Secondary | ICD-10-CM | POA: Insufficient documentation

## 2019-02-05 DIAGNOSIS — Z4801 Encounter for change or removal of surgical wound dressing: Secondary | ICD-10-CM | POA: Insufficient documentation

## 2019-02-05 DIAGNOSIS — Z5189 Encounter for other specified aftercare: Secondary | ICD-10-CM

## 2019-02-05 MED ORDER — DOXYCYCLINE HYCLATE 100 MG PO TABS
100.0000 mg | ORAL_TABLET | Freq: Once | ORAL | Status: AC
  Administered 2019-02-05: 100 mg via ORAL
  Filled 2019-02-05: qty 1

## 2019-02-05 NOTE — ED Provider Notes (Signed)
[facility] Emergency Department Provider Note    ____________________________________________   I have reviewed the triage vital signs and the nursing notes.   HISTORY  Chief Complaint Wound Check   History limited by: Not Limited   HPI Stephanie Harris is a 85 y.o. female who presents to the emergency department today because of concern for recurrent blister to her right foot. The patient was seen in the emergency department two days ago for a similar complaint. She states that it was drained at that time. She says she was not able to get her prescriptions filled. She denies any fevers, nausea or vomiting. Is planning on following up with dermatology tomorrow.   Records reviewed. Per medical record review patient has a history of emergency department visit 2 days ago.   Past Medical History:  Diagnosis Date  . Acute bronchiolitis   . Acute pharyngitis   . Allergic rhinitis due to pollen   . Anemia, unspecified    CKD and LGL  . Anxiety state    unspecified  . Arthritis    "knees, legs" (03/18/2018)  . Asthma without status asthmaticus    unspecified  . B12 deficiency   . Beta thalassemia trait   . Carotid artery occlusion   . Cellulitis and abscess of leg, except foot   . Cervical spondylosis   . Cervicalgia   . Chronic kidney disease   . Chronic lower back pain   . CML (chronic myelocytic leukemia) ([place])    Onc at [facility]  . Complication of anesthesia    "last back surgery they liked to never get me awake" (03/18/2018)  . Coronary artery disease 2009   heart attack with stent  . Coronary atherosclerosis of native coronary artery   . Depressive disorder    not elsewhere classified  . Difficulty in walking   . Disorder of breast, unspecified   . Dizziness and giddiness   . Dysuria   . Esophageal reflux   . Essential hypertension    unspecified  . Family history of adverse reaction to anesthesia    "liked to never get my sister awake"  (03/18/2018)  . Head injury    unspecified  . Headache    "q time I have a stroke" (03/18/2018)  . Heart disease   . Hematuria, unspecified   . History of blood transfusion    "had 22 when they found out I had leukemia" (03/18/2018)  . Hypersomnia, unspecified   . Hypertension   . Hypopotassemia   . Hypothyroidism   . ICH (intracerebral hemorrhage) ([place])   . Ill-defined cerebrovascular disease    other  . Insomnia    unspecified  . 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 Leukemia ([place])   . Lower urinary tract infection   . Lumbago   . Mini stroke ([place])    x years  . Mixed hyperlipidemia   . Myocardial infarction ([place]) ~2017  . Nontoxic nodular goiter    unspecified  . Occlusion and stenosis of unspecified carotid artery    without mention of cerebral infarction  . Osteoarthritis   . Osteoporosis   . Other constipation   . Other vitamin B12 deficiency anemias   . Otitis media, unspecified, unspecified ear   . Ovarian failure    unspecified  . Pain in limb   . Panic disorder without agoraphobia   . Peripheral vascular disease ([place])    unspecified  . Phlebitis of left arm   . RA (  rheumatoid arthritis) ([place])   . Sigmoid polyp 1998  . Sleep disturbance    unspecified  . Stroke ([place]) 02/2017   "lots of mini strokes; big one 02/2017; that one made me weak in my knees,; never fully recovered" (03/18/2018)  . Syncope and collapse   . Thalassemia   . TIA (transient ischemic attack)    2000 and 2008 right carotid stent 08/14/2009 on Plavix  . TIA (transient ischemic attack) 03/18/2018  . Transient disorder of initiating or maintaining sleep   . Type II diabetes mellitus ([place])   . Varicose veins of bilateral lower extremities with other complications   . Varicose veins of bilateral lower extremities with other complications   . Vision abnormalities     Patient Active Problem List   Diagnosis Date Noted  . Skin lesion of right lower extremity 12/06/2018  .  Diabetes mellitus without complication ([place]) 81/19/1478  . Urinary tract infection without hematuria 11/06/2018  . Bladder pain 11/06/2018  . Large granular lymphocytosis 10/05/2018  . Hypoglycemia 08/31/2018  . Nausea 08/31/2018  . B12 deficiency 08/31/2018  . Mini stroke ([place]) 08/30/2018  . Acquired hypothyroidism 07/11/2018  . Dyslipidemia 07/11/2018  . Chronic renal disease, stage III ([place]) 07/11/2018  . Sacral fracture, closed ([place]) 07/11/2018  . Traumatic hematoma of buttock 07/11/2018  . Uses walker 07/07/2018  . Frequent falls 07/02/2018  . Sacral fracture, closed ([place]) 07/01/2018  . Traumatic hematoma of buttock 07/01/2018  . At risk for domestic violence 05/06/2018  . Pain in right foot 04/06/2018  . Cerebrovascular accident (CVA) ([place]) 04/05/2018  . Essential hypertension, benign 04/05/2018  . Carotid artery calcification, bilateral 04/05/2018  . Occlusion and stenosis of both vertebral arteries   . Stenosis of carotid artery   . Hypertensive emergency   . S/P kyphoplasty 12/16/2017  . Age-related osteoporosis with current pathological fracture 11/10/2017  . Protein-calorie malnutrition, severe 11/08/2017  . Lumbar compression fracture ([place]) 11/05/2017  . Spondylosis of lumbar region without myelopathy or radiculopathy 10/08/2017  . Chronic midline low back pain 08/29/2017  . Closed wedge compression fracture of first lumbar vertebra with delayed healing 08/29/2017  . Anemia in stage 3 chronic kidney disease ([place]) 06/02/2017  . Left-sided nontraumatic intracerebral hemorrhage ([place]) 03/23/2017  . Primary localized osteoarthrosis of shoulder, left 12/31/2016  . Primary osteoarthritis of one knee, left 12/31/2016  . Closed fracture of proximal end of left humerus with routine healing 08/28/2016  . Gross hematuria 06/25/2016  . Incomplete emptying of bladder 06/25/2016  . Moderate mitral insufficiency 10/31/2015  . Syncope and collapse 10/31/2015  . Continuous leakage  of urine 08/30/2015  . Frequent UTI 08/30/2015  . Muscle weakness of lower extremity 03/20/2015  . Chest pain 03/14/2015  . TIA (transient ischemic attack) 01/29/2015  . Allergic arthritis, hand   . Adhesive capsulitis of left shoulder 12/13/2014  . SI joint arthritis 07/05/2014  . Mixed hyperlipidemia 05/22/2014  . Temporary cerebral vascular dysfunction 05/22/2014  . Irritable bowel syndrome with constipation 05/03/2014  . Dysphagia 05/03/2014  . Weight loss 05/03/2014  . Bilateral carotid artery disease ([place]) 04/18/2014  . Carotid atherosclerosis 04/18/2014  . Cerebral infarction, unspecified ([place]) 04/18/2014  . Cerebral artery occlusion with cerebral infarction ([place]) 01/11/2014  . Coronary atherosclerosis 01/11/2014  . Closed fracture of lumbar vertebra ([place]) 12/19/2013  . Non-traumatic compression fracture of second lumbar vertebra ([place]) 12/19/2013  . Fall in home 05/19/2013  . Compression fracture of T12 vertebra ([place]) 04/27/2013  . Closed fracture of dorsal (thoracic)  vertebra ([place]) 04/27/2013  . 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 Anemia due to stage 3 chronic kidney disease ([place]) 06/15/2012  . Lymphoid leukemia ([place]) 06/15/2012  . Unspecified visual loss 05/20/2012  . Degenerative drusen 05/06/2012  . Drusen, retina 05/06/2012    Past Surgical History:  Procedure Laterality Date  . BACK SURGERY    . CAROTID ENDARTERECTOMY Left   . CAROTID STENT INSERTION Right    "have 2 stents in there" (03/18/2018)  . CATARACT EXTRACTION W/ INTRAOCULAR LENS  IMPLANT, BILATERAL Bilateral 06/16/2001 - 05/28/2003   +23.5D     22.5D  . CHOLECYSTECTOMY OPEN  1980  . COLONOSCOPY  2010  . CORONARY ANGIOPLASTY WITH STENT PLACEMENT    . EYELID SURGERY Bilateral 2005   BUL BLEPH  . FRACTURE SURGERY    . JOINT REPLACEMENT    . KYPHOPLASTY N/A 08/12/2017   Procedure: KYPHOPLASTY;  Surgeon: Hessie Knows, MD;  Location: [facility] ORS;  Service: Orthopedics;   Laterality: N/A;  . KYPHOPLASTY N/A 11/08/2017   Procedure: Hewitt Shorts;  Surgeon: Hessie Knows, MD;  Location: [facility] ORS;  Service: Orthopedics;  Laterality: N/A;  . PERCUTANEOUS PLACEMENT INTRAVASCULAR STENT CERVICAL CAROTID ARTERY  06/14/2009  . ROTATOR CUFF REPAIR Left   . TOTAL ABDOMINAL HYSTERECTOMY  1978   WITH REMOVAL TUBES & /OR OVARIES  . TOTAL KNEE ARTHROPLASTY Right     Prior to Admission medications   Medication Sig Start Date End Date Taking? Authorizing Provider  acetaminophen (TYLENOL) 500 MG tablet Take 1,000 mg by mouth every 8 (eight) hours. 07/07/18   [provider]  amLODipine (NORVASC) 5 MG tablet Take 1 tablet (5 mg total) by mouth daily. 10/04/18   Dustin Flock, MD  aspirin 81 MG chewable tablet Chew 81 mg by mouth daily.    [provider]  aspirin EC 325 MG tablet Take 1 tablet (325 mg total) by mouth daily. Patient not taking: Reported on 12/28/2018 10/03/18 11/07/19  Dustin Flock, MD  atorvastatin (LIPITOR) 40 MG tablet Take 0.5 tablets (20 mg total) by mouth daily at 6 PM. 10/03/18   Dustin Flock, MD  ciprofloxacin (CIPRO) 250 MG tablet Take 1 tablet (250 mg total) by mouth 2 (two) times daily. Patient not taking: Reported on 12/28/2018 12/06/18   Ronnell Freshwater, NP  doxycycline (DORYX) 100 MG EC tablet Take 1 tablet (100 mg total) by mouth 2 (two) times daily for 7 days. 02/03/19 02/10/19  Duffy Bruce, MD  levothyroxine (SYNTHROID) 50 MCG tablet Take 1 tablet (50 mcg total) by mouth daily. Name brand is medically necessary. 01/09/19   Ronnell Freshwater, NP  linaclotide (LINZESS) 145 MCG CAPS capsule Take 1 capsule (145 mcg total) by mouth daily before breakfast. Patient not taking: Reported on 12/28/2018 08/30/18   Ronnell Freshwater, NP  lisinopril (PRINIVIL,ZESTRIL) 2.5 MG tablet Take 2 tablets (5 mg total) by mouth daily. 10/04/18   Dustin Flock, MD  mupirocin cream Drue Stager) 2 % Apply over any open areas of skin of bilateral feet  twice a day for 7-10 days, or until healed 02/03/19 02/03/20  Duffy Bruce, MD  ondansetron (ZOFRAN ODT) 4 MG disintegrating tablet Take 1 tablet (4 mg total) by mouth every 8 (eight) hours as needed for nausea or vomiting. 09/16/18   Ronnell Freshwater, NP  oxyCODONE (OXY IR/ROXICODONE) 5 MG immediate release tablet Take 2.5-5 mg by mouth every 6 (six) hours as needed for moderate pain or severe pain.    [provider]  phenazopyridine (PYRIDIUM) 100 MG tablet Take 2 tablets (200 mg total) by mouth 3 (three) times daily as needed for pain. Patient not taking: Reported on 12/28/2018 10/21/18   Ronnell Freshwater, NP  silver sulfADIAZINE (SILVADENE) 1 % cream Apply to affected area daily Patient not taking: Reported on 12/28/2018 12/08/18 12/08/19  Vickie Epley, MD    Allergies Latex, Meperidine, Penicillins, Cefuroxime axetil, Codeine, and Propoxyphene  Family History  Problem Relation Age of Onset  . Acute myelogenous leukemia Brother   . Anemia Brother   . Coronary artery disease Brother   . Heart disease Brother   . Stroke Brother   . Heart failure Brother   . Stroke Mother   . Anemia Mother   . Heart disease Mother   . Heart failure Mother   . Hypertension Mother   . Osteoarthritis Mother   . Rheum arthritis Mother   . Heart attack Father   . Anemia Sister   . Cataracts Sister   . Osteoarthritis Sister   . Rheum arthritis Sister   . Stroke Sister   . Heart disease Sister   . Thalassemia Sister   . Thalassemia Sister   . Blindness Neg Hx   . Glaucoma Neg Hx   . Macular degeneration Neg Hx   . Strabismus Neg Hx   . Vision loss Neg Hx   . Basal cell carcinoma Neg Hx   . GU problems Neg Hx   . Kidney cancer Neg Hx   . Melanoma Neg Hx   . Kidney disease Neg Hx   . Prostate cancer Neg Hx   . Squamous cell carcinoma Neg Hx     Social History Social History   Tobacco Use  . Smoking status: Never Smoker  . Smokeless tobacco: Never Used  Substance Use Topics   . Alcohol use: No  . Drug use: No    Review of Systems Constitutional: No fever/chills Eyes: No visual changes. ENT: No sore throat. Cardiovascular: Denies chest pain. Respiratory: Denies shortness of breath. Gastrointestinal: No abdominal pain.  No nausea, no vomiting.  No diarrhea.   Genitourinary: Negative for dysuria. Musculoskeletal: Negative for back pain. Skin: Positive for blisters to bilateral feet.  Neurological: Negative for headaches, focal weakness or numbness.  ____________________________________________   PHYSICAL EXAM:  VITAL SIGNS: ED Triage Vitals  Enc Vitals Group     BP 02/05/19 2012 (!) 194/86     Pulse Rate 02/05/19 2012 96     Resp 02/05/19 2012 18     Temp 02/05/19 2012 98 F (36.7 C)     Temp Source 02/05/19 2012 Oral     SpO2 02/05/19 2012 100 %     Weight 02/05/19 2016 97 lb (44 kg)     Height 02/05/19 2016 5' (1.524 m)     Head Circumference --      Peak Flow --      Pain Score 02/05/19 2015 7   Constitutional: Alert and oriented.  Eyes: Conjunctivae are normal.  ENT      Head: Normocephalic and atraumatic.      Nose: No congestion/rhinnorhea.      Mouth/Throat: Mucous membranes are moist.      Neck: No stridor. Hematological/Lymphatic/Immunilogical: No cervical lymphadenopathy. Cardiovascular: Normal rate, regular rhythm.  No murmurs, rubs, or gallops.  Respiratory: Normal respiratory effort without tachypnea nor retractions. Breath sounds are clear and equal bilaterally. No wheezes/rales/rhonchi. Gastrointestinal: Soft and non tender. No rebound. No guarding.  Genitourinary: Deferred Musculoskeletal: Normal range  of motion in all extremities. No lower extremity edema. Neurologic:  Normal speech and language. No gross focal neurologic deficits are appreciated.  Skin:  Large blister on distal right dorsal foot, smaller blister to dorsal base of right great toe. Small blister to dorsal left foot.  Psychiatric: Mood and affect are  normal. Speech and behavior are normal. Patient exhibits appropriate insight and judgment.  ____________________________________________    LABS (pertinent positives/negatives)  None  ____________________________________________   EKG  None  ____________________________________________    RADIOLOGY  None  ____________________________________________   PROCEDURES  Procedures  ____________________________________________   INITIAL IMPRESSION / ASSESSMENT AND PLAN / ED COURSE  Pertinent labs & imaging results that were available during my care of the patient were reviewed by me and considered in my medical decision making (see chart for details).   Patient presented to the emergency department today because of concerns for recurrent blister.  On exam patient does have a large blister to the distal right foot.  Smaller blister at the base of the right great toe.  No surrounding erythema.  Patient also has a small blister to the left foot without any surrounding erythema.  Patient without any systemic signs of illness.  Will give patient dose of doxycycline here in the emergency department since she has not been able to fill her prescription.  Discussed with patient importance of following up.  ____________________________________________   FINAL CLINICAL IMPRESSION(S) / ED DIAGNOSES  Final diagnoses:  Visit for wound check  Blister     Note: This dictation was prepared with Dragon dictation. Any transcriptional errors that result from this process are unintentional     Nance Pear, MD 02/05/19 2151

## 2019-02-05 NOTE — ED Notes (Signed)
Pt had blisters drained on Friday - never obtained the rx's. Blisters back today.

## 2019-02-05 NOTE — ED Notes (Signed)
Rt foot dressed with dsd

## 2019-02-05 NOTE — Discharge Instructions (Addendum)
Please seek medical attention for any high fevers, chest pain, shortness of breath, change in behavior, persistent vomiting, bloody stool or any other new or concerning symptoms.  

## 2019-02-05 NOTE — ED Triage Notes (Signed)
Patient states that she had surgery on Friday to bilateral feet for blisters to bilateral feet. Patient states that the blisters started coming back Thursday morning. Patient with large blister to top of right foot and small blister to left foot.

## 2019-02-06 ENCOUNTER — Encounter: Payer: Self-pay | Admitting: Surgery

## 2019-02-06 ENCOUNTER — Ambulatory Visit (INDEPENDENT_AMBULATORY_CARE_PROVIDER_SITE_OTHER): Payer: Medicare Other | Admitting: Surgery

## 2019-02-06 VITALS — BP 123/84 | HR 109 | Temp 97.7°F | Ht 60.0 in | Wt 97.0 lb

## 2019-02-06 DIAGNOSIS — S90521D Blister (nonthermal), right ankle, subsequent encounter: Secondary | ICD-10-CM | POA: Diagnosis not present

## 2019-02-06 NOTE — Progress Notes (Signed)
Outpatient Surgical Follow Up  02/06/2019  Rabab Currington Zuver is an 85 y.o. female.   Chief Complaint  Patient presents with  . Other    HPI: Manal is an 85 year old female with multiple comorbidities including chronic kidney disease, anemia, CML, coronary artery disease and history of stroke.  Comes in a wheelchair accompanied by her neighbor.  She was recently seen in the emergency room for a blister on her right foot.  She reports that she has had this blister for several days and thought it was infected.  Denies any fevers and chills.  There is some mild edema on the right foot.  She denies any trauma.  BC revealed platelets of 140,000 and a hemoglobin of 9.8.  Imaging studies are available.  Past Medical History:  Diagnosis Date  . Acute bronchiolitis   . Acute pharyngitis   . Allergic rhinitis due to pollen   . Anemia, unspecified    CKD and LGL  . Anxiety state    unspecified  . Arthritis    "knees, legs" (03/18/2018)  . Asthma without status asthmaticus    unspecified  . B12 deficiency   . Beta thalassemia trait   . Carotid artery occlusion   . Cellulitis and abscess of leg, except foot   . Cervical spondylosis   . Cervicalgia   . Chronic kidney disease   . Chronic lower back pain   . CML (chronic myelocytic leukemia) ([place])    Onc at [facility]  . Complication of anesthesia    "last back surgery they liked to never get me awake" (03/18/2018)  . Coronary artery disease 2009   heart attack with stent  . Coronary atherosclerosis of native coronary artery   . Depressive disorder    not elsewhere classified  . Difficulty in walking   . Disorder of breast, unspecified   . Dizziness and giddiness   . Dysuria   . Esophageal reflux   . Essential hypertension    unspecified  . Family history of adverse reaction to anesthesia    "liked to never get my sister awake" (03/18/2018)  . Head injury    unspecified  . Headache    "q time I have a stroke" (03/18/2018)  . Heart disease    . Hematuria, unspecified   . History of blood transfusion    "had 22 when they found out I had leukemia" (03/18/2018)  . Hypersomnia, unspecified   . Hypertension   . Hypopotassemia   . Hypothyroidism   . ICH (intracerebral hemorrhage) ([place])   . Ill-defined cerebrovascular disease    other  . Insomnia    unspecified  . 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 Leukemia ([place])   . Lower urinary tract infection   . Lumbago   . Mini stroke ([place])    x years  . Mixed hyperlipidemia   . Myocardial infarction ([place]) ~2017  . Nontoxic nodular goiter    unspecified  . Occlusion and stenosis of unspecified carotid artery    without mention of cerebral infarction  . Osteoarthritis   . Osteoporosis   . Other constipation   . Other vitamin B12 deficiency anemias   . Otitis media, unspecified, unspecified ear   . Ovarian failure    unspecified  . Pain in limb   . Panic disorder without agoraphobia   . Peripheral vascular disease ([place])    unspecified  . Phlebitis of left arm   . RA (rheumatoid arthritis) ([place])   . Sigmoid polyp  1998  . Sleep disturbance    unspecified  . Stroke ([place]) 02/2017   "lots of mini strokes; big one 02/2017; that one made me weak in my knees,; never fully recovered" (03/18/2018)  . Syncope and collapse   . Thalassemia   . TIA (transient ischemic attack)    2000 and 2008 right carotid stent 08/14/2009 on Plavix  . TIA (transient ischemic attack) 03/18/2018  . Transient disorder of initiating or maintaining sleep   . Type II diabetes mellitus ([place])   . Varicose veins of bilateral lower extremities with other complications   . Varicose veins of bilateral lower extremities with other complications   . Vision abnormalities     Past Surgical History:  Procedure Laterality Date  . BACK SURGERY    . CAROTID ENDARTERECTOMY Left   . CAROTID STENT INSERTION Right    "have 2 stents in there" (03/18/2018)  . CATARACT EXTRACTION W/ INTRAOCULAR LENS   IMPLANT, BILATERAL Bilateral 06/16/2001 - 05/28/2003   +23.5D     22.5D  . CHOLECYSTECTOMY OPEN  1980  . COLONOSCOPY  2010  . CORONARY ANGIOPLASTY WITH STENT PLACEMENT    . EYELID SURGERY Bilateral 2005   BUL BLEPH  . FRACTURE SURGERY    . JOINT REPLACEMENT    . KYPHOPLASTY N/A 08/12/2017   Procedure: KYPHOPLASTY;  Surgeon: Hessie Knows, MD;  Location: [facility] ORS;  Service: Orthopedics;  Laterality: N/A;  . KYPHOPLASTY N/A 11/08/2017   Procedure: Hewitt Shorts;  Surgeon: Hessie Knows, MD;  Location: [facility] ORS;  Service: Orthopedics;  Laterality: N/A;  . PERCUTANEOUS PLACEMENT INTRAVASCULAR STENT CERVICAL CAROTID ARTERY  06/14/2009  . ROTATOR CUFF REPAIR Left   . TOTAL ABDOMINAL HYSTERECTOMY  1978   WITH REMOVAL TUBES & /OR OVARIES  . TOTAL KNEE ARTHROPLASTY Right     Family History  Problem Relation Age of Onset  . Acute myelogenous leukemia Brother   . Anemia Brother   . Coronary artery disease Brother   . Heart disease Brother   . Stroke Brother   . Heart failure Brother   . Stroke Mother   . Anemia Mother   . Heart disease Mother   . Heart failure Mother   . Hypertension Mother   . Osteoarthritis Mother   . Rheum arthritis Mother   . Heart attack Father   . Anemia Sister   . Cataracts Sister   . Osteoarthritis Sister   . Rheum arthritis Sister   . Stroke Sister   . Heart disease Sister   . Thalassemia Sister   . Thalassemia Sister   . Blindness Neg Hx   . Glaucoma Neg Hx   . Macular degeneration Neg Hx   . Strabismus Neg Hx   . Vision loss Neg Hx   . Basal cell carcinoma Neg Hx   . GU problems Neg Hx   . Kidney cancer Neg Hx   . Melanoma Neg Hx   . Kidney disease Neg Hx   . Prostate cancer Neg Hx   . Squamous cell carcinoma Neg Hx     Social History:  reports that she has never smoked. She has never used smokeless tobacco. She reports that she does not drink alcohol or use drugs.  Allergies:  Allergies  Allergen Reactions  . Latex Rash  . Meperidine      Other reaction(s): Other (See Comments) Other Reaction: CNS Disorder  . Penicillins Other (See Comments)    Has patient had a PCN reaction causing immediate rash, facial/tongue/throat swelling, SOB  or lightheadedness with hypotension: Unknown Has patient had a PCN reaction causing severe rash involving mucus membranes or skin necrosis: Unknown Has patient had a PCN reaction that required hospitalization: Unknown Has patient had a PCN reaction occurring within the last 10 years: Unknown If all of the above answers are "NO", then may proceed with Cephalosporin use.   . Cefuroxime Axetil Nausea And Vomiting  . Codeine Nausea And Vomiting and Nausea Only  . Propoxyphene Nausea Only    Other reaction(s): Vomiting    Medications reviewed.    ROS Full ROS performed and is otherwise negative other than what is stated in HPI   BP 123/84   Pulse (!) 109   Temp 97.7 F (36.5 C) (Skin)   Ht 5' (1.524 m)   Wt 97 lb (44 kg)   SpO2 98%   BMI 18.94 kg/m   Physical Exam Vitals signs and nursing note reviewed. Exam conducted with a chaperone present.  Constitutional:      General: She is not in acute distress.    Appearance: Normal appearance.     Comments: Debilitated  female in a wheelchair in no acute distress  Pulmonary:     Effort: Pulmonary effort is normal. No respiratory distress.  Abdominal:     General: Abdomen is flat. There is no distension.     Palpations: There is no mass.     Tenderness: There is no abdominal tenderness. There is no guarding or rebound.     Hernia: No hernia is present.  Musculoskeletal: Normal range of motion.        General: Swelling present. No tenderness or signs of injury.  Skin:    General: Skin is warm and dry.     Capillary Refill: Capillary refill takes less than 2 seconds.     Comments: There is a bullae on the dorsum of the right foot.  There is also a bullae on the first MP joint.  There is no evidence of infection there is no evidence  of necrotizing fasciitis.  Neurological:     General: No focal deficit present.     Mental Status: She is alert and oriented to person, place, and time.  Psychiatric:        Mood and Affect: Mood normal.        Behavior: Behavior normal.        Thought Content: Thought content normal.        Judgment: Judgment normal.       Assessment/Plan: Right Foot bullae likely from an underlying disease..  There is no evidence of necrotizing infection or abscess. Most  Important thing forof this is to have an appropriate work-up by dermatology to include biopsy and further testings for bullous disease. There is no need for surgical intervention at this time.  No evidence of trauma.  May need hematological work-up as well. Definitely not vaginal surgical issue but are rather at dermatological and systemic issue.  ExTensive counseling provided to the patient and to the current provider. Currently notes a dermatologist and is calling today, buys her that we will be happy to do a referral to a dermatologist for expedited care.  At this time she is confident that she can call. note that I spent at least 30 minutes in this encounter with greater than 50% spent in coordination and counseling of her care   Caroleen Hamman, MD [place] Surgeon

## 2019-02-06 NOTE — Patient Instructions (Signed)
Return as needed. .may need a ref to dermatlolgist

## 2019-02-07 ENCOUNTER — Telehealth: Payer: Self-pay | Admitting: Surgery

## 2019-02-07 NOTE — Telephone Encounter (Signed)
Patient called and stated that she needs a referral to [facility] their phone number is [phone]

## 2019-02-07 NOTE — Telephone Encounter (Signed)
Advised patient to try calling later today.

## 2019-02-07 NOTE — Telephone Encounter (Signed)
Patient is calling said she was suppose to be going to see a dermatology but isn't able to get anyone on the phone and was told to call our office if she couldn't get in touch with an office. Please call and advise.

## 2019-02-07 NOTE — Telephone Encounter (Signed)
Referral has been sent to [facility] per patient request.  Ph: [phone] Fax: [phone]

## 2019-02-09 DIAGNOSIS — N183 Chronic kidney disease, stage 3 (moderate): Secondary | ICD-10-CM | POA: Diagnosis not present

## 2019-02-09 DIAGNOSIS — D631 Anemia in chronic kidney disease: Secondary | ICD-10-CM | POA: Diagnosis not present

## 2019-02-15 ENCOUNTER — Telehealth: Payer: Self-pay

## 2019-02-15 NOTE — Telephone Encounter (Signed)
Pt called today her blister was back advised her call [facility] and also we send referral for dermatology

## 2019-02-16 ENCOUNTER — Telehealth: Payer: Self-pay | Admitting: Surgery

## 2019-02-16 DIAGNOSIS — L0291 Cutaneous abscess, unspecified: Secondary | ICD-10-CM | POA: Diagnosis not present

## 2019-02-16 DIAGNOSIS — L0390 Cellulitis, unspecified: Secondary | ICD-10-CM | POA: Diagnosis not present

## 2019-02-16 DIAGNOSIS — R21 Rash and other nonspecific skin eruption: Secondary | ICD-10-CM | POA: Diagnosis not present

## 2019-02-16 NOTE — Telephone Encounter (Signed)
Patient is calling and said she couldn't get into [facility] until august and, patient called her primary care doctor and was able to get the patient an appointment today with [facility] at 4:15pm.

## 2019-02-21 ENCOUNTER — Other Ambulatory Visit: Payer: Self-pay | Admitting: Nurse Practitioner

## 2019-02-21 ENCOUNTER — Telehealth: Payer: Self-pay

## 2019-02-21 DIAGNOSIS — L989 Disorder of the skin and subcutaneous tissue, unspecified: Secondary | ICD-10-CM

## 2019-02-21 MED ORDER — CIPROFLOXACIN HCL 250 MG PO TABS: 250.0000 mg | ORAL_TABLET | Freq: Two times a day (BID) | ORAL | 0 refills | Status: DC

## 2019-02-21 NOTE — Progress Notes (Signed)
Start cipro 250mg  twice daily for 7 days. [facility]

## 2019-02-21 NOTE — Telephone Encounter (Signed)
Pt advised on medication at pharmacy

## 2019-02-21 NOTE — Telephone Encounter (Signed)
Start cipro 250mg  twice daily for 7 days. [facility].

## 2019-02-21 NOTE — Telephone Encounter (Signed)
lmom 

## 2019-02-22 DIAGNOSIS — R238 Other skin changes: Secondary | ICD-10-CM | POA: Diagnosis not present

## 2019-02-22 DIAGNOSIS — L0291 Cutaneous abscess, unspecified: Secondary | ICD-10-CM | POA: Diagnosis not present

## 2019-02-23 DIAGNOSIS — N183 Chronic kidney disease, stage 3 (moderate): Secondary | ICD-10-CM | POA: Diagnosis not present

## 2019-02-23 DIAGNOSIS — D631 Anemia in chronic kidney disease: Secondary | ICD-10-CM | POA: Diagnosis not present

## 2019-02-23 DIAGNOSIS — D72820 Lymphocytosis (symptomatic): Secondary | ICD-10-CM | POA: Diagnosis not present

## 2019-03-01 ENCOUNTER — Encounter: Payer: Self-pay | Admitting: *Deleted

## 2019-03-03 ENCOUNTER — Ambulatory Visit (INDEPENDENT_AMBULATORY_CARE_PROVIDER_SITE_OTHER): Payer: Medicare Other | Admitting: Nurse Practitioner

## 2019-03-03 ENCOUNTER — Other Ambulatory Visit: Payer: Self-pay

## 2019-03-03 ENCOUNTER — Encounter: Payer: Self-pay | Admitting: Nurse Practitioner

## 2019-03-03 VITALS — Ht 60.0 in | Wt 98.0 lb

## 2019-03-03 DIAGNOSIS — I10 Essential (primary) hypertension: Secondary | ICD-10-CM

## 2019-03-03 DIAGNOSIS — R29898 Other symptoms and signs involving the musculoskeletal system: Secondary | ICD-10-CM

## 2019-03-03 DIAGNOSIS — R269 Unspecified abnormalities of gait and mobility: Secondary | ICD-10-CM | POA: Diagnosis not present

## 2019-03-03 NOTE — Progress Notes (Signed)
[facility] [place], [place] [address]  Internal MEDICINE  Telephone Visit  Patient Name: Stephanie Harris  [id number]  [id number]  Date of Service: 03/12/2019  I connected with the patient at 4:11pm by telephone and verified the patients identity using two identifiers.   I discussed the limitations, risks, security and privacy concerns of performing an evaluation and management service by telephone and the availability of in person appointments. I also discussed with the patient that there may be a patient responsible charge related to the service.  The patient expressed understanding and agrees to proceed.    Chief Complaint  Patient presents with  . Telephone Screen    PHONE VISIT [phone]  . Telephone Assessment  . Referral    Pt requesting order for new alker and physical therapy    The patient has been contacted via telephone for follow up visit due to concerns for spread of novel coronavirus. The patient continues to have weakness in both of her legs. Getting so severe she can hardly put weight on them. Needs to use her rolling walker at all times. She did have physical therapy coming to her home to help improve her strength and build up muscle tone. Hours were initially cut back when spread of COVID 19 first started. She now needs to have a new prescriptin for physical therapy to regain services.       Current Medication: Outpatient Encounter Medications as of 03/03/2019  Medication Sig  . acetaminophen (TYLENOL) 500 MG tablet Take 1,000 mg by mouth every 8 (eight) hours.  Marland Kitchen amLODipine (NORVASC) 5 MG tablet Take 1 tablet (5 mg total) by mouth daily.  Marland Kitchen aspirin 81 MG chewable tablet Chew 81 mg by mouth daily.  Marland Kitchen aspirin EC 325 MG tablet Take 1 tablet (325 mg total) by mouth daily.  Marland Kitchen atorvastatin (LIPITOR) 40 MG tablet Take 0.5 tablets (20 mg total) by mouth daily at 6 PM.  . ciprofloxacin (CIPRO) 250 MG tablet Take 1 tablet (250 mg total) by  mouth 2 (two) times daily.  Marland Kitchen levothyroxine (SYNTHROID) 50 MCG tablet Take 1 tablet (50 mcg total) by mouth daily. Name brand is medically necessary.  Marland Kitchen linaclotide (LINZESS) 145 MCG CAPS capsule Take 1 capsule (145 mcg total) by mouth daily before breakfast.  . lisinopril (PRINIVIL,ZESTRIL) 2.5 MG tablet Take 2 tablets (5 mg total) by mouth daily.  . mupirocin cream (BACTROBAN) 2 % Apply over any open areas of skin of bilateral feet twice a day for 7-10 days, or until healed  . ondansetron (ZOFRAN ODT) 4 MG disintegrating tablet Take 1 tablet (4 mg total) by mouth every 8 (eight) hours as needed for nausea or vomiting.  Marland Kitchen oxyCODONE (OXY IR/ROXICODONE) 5 MG immediate release tablet Take 2.5-5 mg by mouth every 6 (six) hours as needed for moderate pain or severe pain.  . phenazopyridine (PYRIDIUM) 100 MG tablet Take 2 tablets (200 mg total) by mouth 3 (three) times daily as needed for pain.  . silver sulfADIAZINE (SILVADENE) 1 % cream Apply to affected area daily   No facility-administered encounter medications on file as of 03/03/2019.     Surgical History: Past Surgical History:  Procedure Laterality Date  . BACK SURGERY    . CAROTID ENDARTERECTOMY Left   . CAROTID STENT INSERTION Right    "have 2 stents in there" (03/18/2018)  . CATARACT EXTRACTION W/ INTRAOCULAR LENS  IMPLANT, BILATERAL Bilateral 06/16/2001 - 05/28/2003   +23.5D     22.5D  .  CHOLECYSTECTOMY OPEN  1980  . COLONOSCOPY  2010  . CORONARY ANGIOPLASTY WITH STENT PLACEMENT    . EYELID SURGERY Bilateral 2005   BUL BLEPH  . FRACTURE SURGERY    . JOINT REPLACEMENT    . KYPHOPLASTY N/A 08/12/2017   Procedure: KYPHOPLASTY;  Surgeon: Hessie Knows, MD;  Location: [facility] ORS;  Service: Orthopedics;  Laterality: N/A;  . KYPHOPLASTY N/A 11/08/2017   Procedure: Hewitt Shorts;  Surgeon: Hessie Knows, MD;  Location: [facility] ORS;  Service: Orthopedics;  Laterality: N/A;  . PERCUTANEOUS PLACEMENT INTRAVASCULAR STENT CERVICAL CAROTID ARTERY   06/14/2009  . ROTATOR CUFF REPAIR Left   . TOTAL ABDOMINAL HYSTERECTOMY  1978   WITH REMOVAL TUBES & /OR OVARIES  . TOTAL KNEE ARTHROPLASTY Right     Medical History: Past Medical History:  Diagnosis Date  . Acute bronchiolitis   . Acute pharyngitis   . Allergic rhinitis due to pollen   . Anemia, unspecified    CKD and LGL  . Anxiety state    unspecified  . Arthritis    "knees, legs" (03/18/2018)  . Asthma without status asthmaticus    unspecified  . B12 deficiency   . Beta thalassemia trait   . Carotid artery occlusion   . Cellulitis and abscess of leg, except foot   . Cervical spondylosis   . Cervicalgia   . Chronic kidney disease   . Chronic lower back pain   . CML (chronic myelocytic leukemia) ([place])    Onc at [facility]  . Complication of anesthesia    "last back surgery they liked to never get me awake" (03/18/2018)  . Coronary artery disease 2009   heart attack with stent  . Coronary atherosclerosis of native coronary artery   . Depressive disorder    not elsewhere classified  . Difficulty in walking   . Disorder of breast, unspecified   . Dizziness and giddiness   . Dysuria   . Esophageal reflux   . Essential hypertension    unspecified  . Family history of adverse reaction to anesthesia    "liked to never get my sister awake" (03/18/2018)  . Head injury    unspecified  . Headache    "q time I have a stroke" (03/18/2018)  . Heart disease   . Hematuria, unspecified   . History of blood transfusion    "had 22 when they found out I had leukemia" (03/18/2018)  . Hypersomnia, unspecified   . Hypertension   . Hypopotassemia   . Hypothyroidism   . ICH (intracerebral hemorrhage) ([place])   . Ill-defined cerebrovascular disease    other  . Insomnia    unspecified  . 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 Leukemia ([place])   . Lower urinary tract infection   . Lumbago   . Mini stroke ([place])    x years  . Mixed hyperlipidemia   . Myocardial infarction  ([place]) ~2017  . Nontoxic nodular goiter    unspecified  . Occlusion and stenosis of unspecified carotid artery    without mention of cerebral infarction  . Osteoarthritis   . Osteoporosis   . Other constipation   . Other vitamin B12 deficiency anemias   . Otitis media, unspecified, unspecified ear   . Ovarian failure    unspecified  . Pain in limb   . Panic disorder without agoraphobia   . Peripheral vascular disease ([place])    unspecified  . Phlebitis of left arm   . RA (rheumatoid arthritis) ([place])   .  Sigmoid polyp 1998  . Sleep disturbance    unspecified  . Stroke ([place]) 02/2017   "lots of mini strokes; big one 02/2017; that one made me weak in my knees,; never fully recovered" (03/18/2018)  . Syncope and collapse   . Thalassemia   . TIA (transient ischemic attack)    2000 and 2008 right carotid stent 08/14/2009 on Plavix  . TIA (transient ischemic attack) 03/18/2018  . Transient disorder of initiating or maintaining sleep   . Type II diabetes mellitus ([place])   . Varicose veins of bilateral lower extremities with other complications   . Varicose veins of bilateral lower extremities with other complications   . Vision abnormalities     Family History: Family History  Problem Relation Age of Onset  . Acute myelogenous leukemia Brother   . Anemia Brother   . Coronary artery disease Brother   . Heart disease Brother   . Stroke Brother   . Heart failure Brother   . Stroke Mother   . Anemia Mother   . Heart disease Mother   . Heart failure Mother   . Hypertension Mother   . Osteoarthritis Mother   . Rheum arthritis Mother   . Heart attack Father   . Anemia Sister   . Cataracts Sister   . Osteoarthritis Sister   . Rheum arthritis Sister   . Stroke Sister   . Heart disease Sister   . Thalassemia Sister   . Thalassemia Sister   . Blindness Neg Hx   . Glaucoma Neg Hx   . Macular degeneration Neg Hx   . Strabismus Neg Hx   . Vision loss Neg Hx   . Basal cell  carcinoma Neg Hx   . GU problems Neg Hx   . Kidney cancer Neg Hx   . Melanoma Neg Hx   . Kidney disease Neg Hx   . Prostate cancer Neg Hx   . Squamous cell carcinoma Neg Hx     Social History   Socioeconomic History  . Marital status: Married    Spouse name: Not on file  . Number of children: Not on file  . Years of education: Not on file  . Highest education level: Not on file  Occupational History  . Not on file  Social Needs  . Financial resource strain: Not on file  . Food insecurity    Worry: Not on file    Inability: Not on file  . Transportation needs    Medical: Not on file    Non-medical: Not on file  Tobacco Use  . Smoking status: Never Smoker  . Smokeless tobacco: Never Used  Substance and Sexual Activity  . Alcohol use: No  . Drug use: No  . Sexual activity: Never  Lifestyle  . Physical activity    Days per week: Not on file    Minutes per session: Not on file  . Stress: Not on file  Relationships  . Social Herbalist on phone: Not on file    Gets together: Not on file    Attends religious service: Not on file    Active member of club or organization: Not on file    Attends meetings of clubs or organizations: Not on file    Relationship status: Not on file  . Intimate partner violence    Fear of current or ex partner: Not on file    Emotionally abused: Not on file    Physically abused: Not on file  Forced sexual activity: Not on file  Other Topics Concern  . Not on file  Social History Narrative  . Not on file      Review of Systems  Constitutional: Positive for activity change and fatigue. Negative for chills and unexpected weight change.  HENT: Negative for congestion, rhinorrhea, sneezing and sore throat.   Respiratory: Negative for cough, chest tightness, shortness of breath and wheezing.   Cardiovascular: Negative for chest pain and palpitations.  Gastrointestinal: Negative for abdominal pain, constipation, diarrhea, nausea  and vomiting.  Musculoskeletal: Positive for gait problem. Negative for arthralgias, back pain, joint swelling and neck pain.       Generalized, worsening weakness in both legs.   Skin: Negative for rash.  Neurological: Positive for weakness. Negative for tremors and numbness.  Hematological: Negative for adenopathy. Does not bruise/bleed easily.  Psychiatric/Behavioral: Negative for behavioral problems and sleep disturbance. The patient is nervous/anxious.     Today's Vitals   03/03/19 1531  Weight: 98 lb (44.5 kg)  Height: 5' (1.524 m)   Body mass index is 19.14 kg/m.  Observation/Objective:   The patient is alert and oriented. She is pleasant and answers all questions appropriately. Breathing is non-labored. She is in no acute distress at this time.    Assessment/Plan: 1. Weakness of both legs A new order for physical therapy to be sent to [facility] to help improve strength and muscle tone in her legs.   2. Abnormality of gait A new prescription for rollator walker (pink) to be sent to DME provider.  3. Essential hypertension, benign Generally stable. Continue bp medication as prescribed.   General Counseling: Nakiah verbalizes understanding of the findings of today's phone visit and agrees with plan of treatment. I have discussed any further diagnostic evaluation that may be needed or ordered today. We also reviewed her medications today. she has been encouraged to call the office with any questions or concerns that should arise related to todays visit.  This patient was seen by Leretha Pol FNP Collaboration with Dr [name] as a part of collaborative care agreement  Time spent: 25 Minutes    Dr [name] Internal medicine

## 2019-03-08 DIAGNOSIS — S90522A Blister (nonthermal), left ankle, initial encounter: Secondary | ICD-10-CM | POA: Diagnosis not present

## 2019-03-08 DIAGNOSIS — Z4801 Encounter for change or removal of surgical wound dressing: Secondary | ICD-10-CM | POA: Diagnosis not present

## 2019-03-08 DIAGNOSIS — R21 Rash and other nonspecific skin eruption: Secondary | ICD-10-CM | POA: Diagnosis not present

## 2019-03-09 DIAGNOSIS — D631 Anemia in chronic kidney disease: Secondary | ICD-10-CM | POA: Diagnosis not present

## 2019-03-09 DIAGNOSIS — N183 Chronic kidney disease, stage 3 (moderate): Secondary | ICD-10-CM | POA: Diagnosis not present

## 2019-03-10 ENCOUNTER — Telehealth: Payer: Self-pay

## 2019-03-10 NOTE — Telephone Encounter (Signed)
Faxed [facility] and we will fax note on monday

## 2019-03-12 DIAGNOSIS — R269 Unspecified abnormalities of gait and mobility: Secondary | ICD-10-CM | POA: Insufficient documentation

## 2019-03-12 DIAGNOSIS — R29898 Other symptoms and signs involving the musculoskeletal system: Secondary | ICD-10-CM | POA: Insufficient documentation

## 2019-03-13 ENCOUNTER — Telehealth: Payer: Self-pay

## 2019-03-13 NOTE — Telephone Encounter (Signed)
FAXED [place]

## 2019-03-16 DIAGNOSIS — E782 Mixed hyperlipidemia: Secondary | ICD-10-CM | POA: Diagnosis not present

## 2019-03-16 DIAGNOSIS — E039 Hypothyroidism, unspecified: Secondary | ICD-10-CM | POA: Diagnosis not present

## 2019-03-16 DIAGNOSIS — M1712 Unilateral primary osteoarthritis, left knee: Secondary | ICD-10-CM | POA: Diagnosis not present

## 2019-03-16 DIAGNOSIS — N189 Chronic kidney disease, unspecified: Secondary | ICD-10-CM | POA: Diagnosis not present

## 2019-03-16 DIAGNOSIS — I252 Old myocardial infarction: Secondary | ICD-10-CM | POA: Diagnosis not present

## 2019-03-16 DIAGNOSIS — M810 Age-related osteoporosis without current pathological fracture: Secondary | ICD-10-CM | POA: Diagnosis not present

## 2019-03-16 DIAGNOSIS — G8929 Other chronic pain: Secondary | ICD-10-CM | POA: Diagnosis not present

## 2019-03-16 DIAGNOSIS — D631 Anemia in chronic kidney disease: Secondary | ICD-10-CM | POA: Diagnosis not present

## 2019-03-16 DIAGNOSIS — J45909 Unspecified asthma, uncomplicated: Secondary | ICD-10-CM | POA: Diagnosis not present

## 2019-03-16 DIAGNOSIS — D125 Benign neoplasm of sigmoid colon: Secondary | ICD-10-CM | POA: Diagnosis not present

## 2019-03-16 DIAGNOSIS — M545 Low back pain: Secondary | ICD-10-CM | POA: Diagnosis not present

## 2019-03-16 DIAGNOSIS — K219 Gastro-esophageal reflux disease without esophagitis: Secondary | ICD-10-CM | POA: Diagnosis not present

## 2019-03-16 DIAGNOSIS — I1310 Hypertensive heart and chronic kidney disease without heart failure, with stage 1 through stage 4 chronic kidney disease, or unspecified chronic kidney disease: Secondary | ICD-10-CM | POA: Diagnosis not present

## 2019-03-16 DIAGNOSIS — E049 Nontoxic goiter, unspecified: Secondary | ICD-10-CM | POA: Diagnosis not present

## 2019-03-16 DIAGNOSIS — I2510 Atherosclerotic heart disease of native coronary artery without angina pectoris: Secondary | ICD-10-CM | POA: Diagnosis not present

## 2019-03-16 DIAGNOSIS — E1122 Type 2 diabetes mellitus with diabetic chronic kidney disease: Secondary | ICD-10-CM | POA: Diagnosis not present

## 2019-03-16 DIAGNOSIS — Z8673 Personal history of transient ischemic attack (TIA), and cerebral infarction without residual deficits: Secondary | ICD-10-CM | POA: Diagnosis not present

## 2019-03-16 DIAGNOSIS — E1151 Type 2 diabetes mellitus with diabetic peripheral angiopathy without gangrene: Secondary | ICD-10-CM | POA: Diagnosis not present

## 2019-03-16 DIAGNOSIS — M47812 Spondylosis without myelopathy or radiculopathy, cervical region: Secondary | ICD-10-CM | POA: Diagnosis not present

## 2019-03-16 DIAGNOSIS — M069 Rheumatoid arthritis, unspecified: Secondary | ICD-10-CM | POA: Diagnosis not present

## 2019-03-22 DIAGNOSIS — I1310 Hypertensive heart and chronic kidney disease without heart failure, with stage 1 through stage 4 chronic kidney disease, or unspecified chronic kidney disease: Secondary | ICD-10-CM | POA: Diagnosis not present

## 2019-03-22 DIAGNOSIS — E1151 Type 2 diabetes mellitus with diabetic peripheral angiopathy without gangrene: Secondary | ICD-10-CM | POA: Diagnosis not present

## 2019-03-22 DIAGNOSIS — E049 Nontoxic goiter, unspecified: Secondary | ICD-10-CM | POA: Diagnosis not present

## 2019-03-22 DIAGNOSIS — M069 Rheumatoid arthritis, unspecified: Secondary | ICD-10-CM | POA: Diagnosis not present

## 2019-03-22 DIAGNOSIS — Z8673 Personal history of transient ischemic attack (TIA), and cerebral infarction without residual deficits: Secondary | ICD-10-CM | POA: Diagnosis not present

## 2019-03-22 DIAGNOSIS — J45909 Unspecified asthma, uncomplicated: Secondary | ICD-10-CM | POA: Diagnosis not present

## 2019-03-22 DIAGNOSIS — N189 Chronic kidney disease, unspecified: Secondary | ICD-10-CM | POA: Diagnosis not present

## 2019-03-22 DIAGNOSIS — D125 Benign neoplasm of sigmoid colon: Secondary | ICD-10-CM | POA: Diagnosis not present

## 2019-03-22 DIAGNOSIS — E1122 Type 2 diabetes mellitus with diabetic chronic kidney disease: Secondary | ICD-10-CM | POA: Diagnosis not present

## 2019-03-22 DIAGNOSIS — M1712 Unilateral primary osteoarthritis, left knee: Secondary | ICD-10-CM | POA: Diagnosis not present

## 2019-03-22 DIAGNOSIS — K219 Gastro-esophageal reflux disease without esophagitis: Secondary | ICD-10-CM | POA: Diagnosis not present

## 2019-03-22 DIAGNOSIS — M810 Age-related osteoporosis without current pathological fracture: Secondary | ICD-10-CM | POA: Diagnosis not present

## 2019-03-22 DIAGNOSIS — I252 Old myocardial infarction: Secondary | ICD-10-CM | POA: Diagnosis not present

## 2019-03-22 DIAGNOSIS — G8929 Other chronic pain: Secondary | ICD-10-CM | POA: Diagnosis not present

## 2019-03-22 DIAGNOSIS — M545 Low back pain: Secondary | ICD-10-CM | POA: Diagnosis not present

## 2019-03-22 DIAGNOSIS — E782 Mixed hyperlipidemia: Secondary | ICD-10-CM | POA: Diagnosis not present

## 2019-03-22 DIAGNOSIS — M47812 Spondylosis without myelopathy or radiculopathy, cervical region: Secondary | ICD-10-CM | POA: Diagnosis not present

## 2019-03-22 DIAGNOSIS — I2510 Atherosclerotic heart disease of native coronary artery without angina pectoris: Secondary | ICD-10-CM | POA: Diagnosis not present

## 2019-03-22 DIAGNOSIS — E039 Hypothyroidism, unspecified: Secondary | ICD-10-CM | POA: Diagnosis not present

## 2019-03-22 DIAGNOSIS — D631 Anemia in chronic kidney disease: Secondary | ICD-10-CM | POA: Diagnosis not present

## 2019-03-23 ENCOUNTER — Telehealth: Payer: Self-pay

## 2019-03-23 NOTE — Telephone Encounter (Signed)
Faxed [facility] order RX for roller walker and put copy in scan. Beth

## 2019-03-24 DIAGNOSIS — M810 Age-related osteoporosis without current pathological fracture: Secondary | ICD-10-CM | POA: Diagnosis not present

## 2019-03-24 DIAGNOSIS — N189 Chronic kidney disease, unspecified: Secondary | ICD-10-CM | POA: Diagnosis not present

## 2019-03-24 DIAGNOSIS — G8929 Other chronic pain: Secondary | ICD-10-CM | POA: Diagnosis not present

## 2019-03-24 DIAGNOSIS — E1122 Type 2 diabetes mellitus with diabetic chronic kidney disease: Secondary | ICD-10-CM | POA: Diagnosis not present

## 2019-03-24 DIAGNOSIS — E039 Hypothyroidism, unspecified: Secondary | ICD-10-CM | POA: Diagnosis not present

## 2019-03-24 DIAGNOSIS — I252 Old myocardial infarction: Secondary | ICD-10-CM | POA: Diagnosis not present

## 2019-03-24 DIAGNOSIS — Z8673 Personal history of transient ischemic attack (TIA), and cerebral infarction without residual deficits: Secondary | ICD-10-CM | POA: Diagnosis not present

## 2019-03-24 DIAGNOSIS — M545 Low back pain: Secondary | ICD-10-CM | POA: Diagnosis not present

## 2019-03-24 DIAGNOSIS — D125 Benign neoplasm of sigmoid colon: Secondary | ICD-10-CM | POA: Diagnosis not present

## 2019-03-24 DIAGNOSIS — M47812 Spondylosis without myelopathy or radiculopathy, cervical region: Secondary | ICD-10-CM | POA: Diagnosis not present

## 2019-03-24 DIAGNOSIS — K219 Gastro-esophageal reflux disease without esophagitis: Secondary | ICD-10-CM | POA: Diagnosis not present

## 2019-03-24 DIAGNOSIS — M1712 Unilateral primary osteoarthritis, left knee: Secondary | ICD-10-CM | POA: Diagnosis not present

## 2019-03-24 DIAGNOSIS — E1151 Type 2 diabetes mellitus with diabetic peripheral angiopathy without gangrene: Secondary | ICD-10-CM | POA: Diagnosis not present

## 2019-03-24 DIAGNOSIS — D631 Anemia in chronic kidney disease: Secondary | ICD-10-CM | POA: Diagnosis not present

## 2019-03-24 DIAGNOSIS — J45909 Unspecified asthma, uncomplicated: Secondary | ICD-10-CM | POA: Diagnosis not present

## 2019-03-24 DIAGNOSIS — M069 Rheumatoid arthritis, unspecified: Secondary | ICD-10-CM | POA: Diagnosis not present

## 2019-03-24 DIAGNOSIS — I1310 Hypertensive heart and chronic kidney disease without heart failure, with stage 1 through stage 4 chronic kidney disease, or unspecified chronic kidney disease: Secondary | ICD-10-CM | POA: Diagnosis not present

## 2019-03-24 DIAGNOSIS — I2510 Atherosclerotic heart disease of native coronary artery without angina pectoris: Secondary | ICD-10-CM | POA: Diagnosis not present

## 2019-03-24 DIAGNOSIS — E782 Mixed hyperlipidemia: Secondary | ICD-10-CM | POA: Diagnosis not present

## 2019-03-24 DIAGNOSIS — E049 Nontoxic goiter, unspecified: Secondary | ICD-10-CM | POA: Diagnosis not present

## 2019-03-27 DIAGNOSIS — G8929 Other chronic pain: Secondary | ICD-10-CM | POA: Diagnosis not present

## 2019-03-27 DIAGNOSIS — E039 Hypothyroidism, unspecified: Secondary | ICD-10-CM | POA: Diagnosis not present

## 2019-03-27 DIAGNOSIS — M1712 Unilateral primary osteoarthritis, left knee: Secondary | ICD-10-CM | POA: Diagnosis not present

## 2019-03-27 DIAGNOSIS — E049 Nontoxic goiter, unspecified: Secondary | ICD-10-CM | POA: Diagnosis not present

## 2019-03-27 DIAGNOSIS — K219 Gastro-esophageal reflux disease without esophagitis: Secondary | ICD-10-CM | POA: Diagnosis not present

## 2019-03-27 DIAGNOSIS — M069 Rheumatoid arthritis, unspecified: Secondary | ICD-10-CM | POA: Diagnosis not present

## 2019-03-27 DIAGNOSIS — Z8673 Personal history of transient ischemic attack (TIA), and cerebral infarction without residual deficits: Secondary | ICD-10-CM | POA: Diagnosis not present

## 2019-03-27 DIAGNOSIS — D631 Anemia in chronic kidney disease: Secondary | ICD-10-CM | POA: Diagnosis not present

## 2019-03-27 DIAGNOSIS — M810 Age-related osteoporosis without current pathological fracture: Secondary | ICD-10-CM | POA: Diagnosis not present

## 2019-03-27 DIAGNOSIS — N189 Chronic kidney disease, unspecified: Secondary | ICD-10-CM | POA: Diagnosis not present

## 2019-03-27 DIAGNOSIS — M47812 Spondylosis without myelopathy or radiculopathy, cervical region: Secondary | ICD-10-CM | POA: Diagnosis not present

## 2019-03-27 DIAGNOSIS — E1151 Type 2 diabetes mellitus with diabetic peripheral angiopathy without gangrene: Secondary | ICD-10-CM | POA: Diagnosis not present

## 2019-03-27 DIAGNOSIS — I2510 Atherosclerotic heart disease of native coronary artery without angina pectoris: Secondary | ICD-10-CM | POA: Diagnosis not present

## 2019-03-27 DIAGNOSIS — J45909 Unspecified asthma, uncomplicated: Secondary | ICD-10-CM | POA: Diagnosis not present

## 2019-03-27 DIAGNOSIS — E1122 Type 2 diabetes mellitus with diabetic chronic kidney disease: Secondary | ICD-10-CM | POA: Diagnosis not present

## 2019-03-27 DIAGNOSIS — D125 Benign neoplasm of sigmoid colon: Secondary | ICD-10-CM | POA: Diagnosis not present

## 2019-03-27 DIAGNOSIS — I1310 Hypertensive heart and chronic kidney disease without heart failure, with stage 1 through stage 4 chronic kidney disease, or unspecified chronic kidney disease: Secondary | ICD-10-CM | POA: Diagnosis not present

## 2019-03-27 DIAGNOSIS — I252 Old myocardial infarction: Secondary | ICD-10-CM | POA: Diagnosis not present

## 2019-03-27 DIAGNOSIS — M545 Low back pain: Secondary | ICD-10-CM | POA: Diagnosis not present

## 2019-03-27 DIAGNOSIS — E782 Mixed hyperlipidemia: Secondary | ICD-10-CM | POA: Diagnosis not present

## 2019-03-29 DIAGNOSIS — Z8673 Personal history of transient ischemic attack (TIA), and cerebral infarction without residual deficits: Secondary | ICD-10-CM | POA: Diagnosis not present

## 2019-03-29 DIAGNOSIS — I252 Old myocardial infarction: Secondary | ICD-10-CM | POA: Diagnosis not present

## 2019-03-29 DIAGNOSIS — M47812 Spondylosis without myelopathy or radiculopathy, cervical region: Secondary | ICD-10-CM | POA: Diagnosis not present

## 2019-03-29 DIAGNOSIS — E1151 Type 2 diabetes mellitus with diabetic peripheral angiopathy without gangrene: Secondary | ICD-10-CM | POA: Diagnosis not present

## 2019-03-29 DIAGNOSIS — K219 Gastro-esophageal reflux disease without esophagitis: Secondary | ICD-10-CM | POA: Diagnosis not present

## 2019-03-29 DIAGNOSIS — I1310 Hypertensive heart and chronic kidney disease without heart failure, with stage 1 through stage 4 chronic kidney disease, or unspecified chronic kidney disease: Secondary | ICD-10-CM | POA: Diagnosis not present

## 2019-03-29 DIAGNOSIS — D125 Benign neoplasm of sigmoid colon: Secondary | ICD-10-CM | POA: Diagnosis not present

## 2019-03-29 DIAGNOSIS — M1712 Unilateral primary osteoarthritis, left knee: Secondary | ICD-10-CM | POA: Diagnosis not present

## 2019-03-29 DIAGNOSIS — M810 Age-related osteoporosis without current pathological fracture: Secondary | ICD-10-CM | POA: Diagnosis not present

## 2019-03-29 DIAGNOSIS — E039 Hypothyroidism, unspecified: Secondary | ICD-10-CM | POA: Diagnosis not present

## 2019-03-29 DIAGNOSIS — G8929 Other chronic pain: Secondary | ICD-10-CM | POA: Diagnosis not present

## 2019-03-29 DIAGNOSIS — E782 Mixed hyperlipidemia: Secondary | ICD-10-CM | POA: Diagnosis not present

## 2019-03-29 DIAGNOSIS — J45909 Unspecified asthma, uncomplicated: Secondary | ICD-10-CM | POA: Diagnosis not present

## 2019-03-29 DIAGNOSIS — E1122 Type 2 diabetes mellitus with diabetic chronic kidney disease: Secondary | ICD-10-CM | POA: Diagnosis not present

## 2019-03-29 DIAGNOSIS — M069 Rheumatoid arthritis, unspecified: Secondary | ICD-10-CM | POA: Diagnosis not present

## 2019-03-29 DIAGNOSIS — M545 Low back pain: Secondary | ICD-10-CM | POA: Diagnosis not present

## 2019-03-29 DIAGNOSIS — I2510 Atherosclerotic heart disease of native coronary artery without angina pectoris: Secondary | ICD-10-CM | POA: Diagnosis not present

## 2019-03-29 DIAGNOSIS — N189 Chronic kidney disease, unspecified: Secondary | ICD-10-CM | POA: Diagnosis not present

## 2019-03-29 DIAGNOSIS — D631 Anemia in chronic kidney disease: Secondary | ICD-10-CM | POA: Diagnosis not present

## 2019-03-29 DIAGNOSIS — E049 Nontoxic goiter, unspecified: Secondary | ICD-10-CM | POA: Diagnosis not present

## 2019-03-30 ENCOUNTER — Ambulatory Visit: Payer: Self-pay | Admitting: *Deleted

## 2019-03-30 DIAGNOSIS — D72820 Lymphocytosis (symptomatic): Secondary | ICD-10-CM | POA: Diagnosis not present

## 2019-03-30 DIAGNOSIS — N183 Chronic kidney disease, stage 3 (moderate): Secondary | ICD-10-CM | POA: Diagnosis not present

## 2019-03-30 DIAGNOSIS — D631 Anemia in chronic kidney disease: Secondary | ICD-10-CM | POA: Diagnosis not present

## 2019-03-31 DIAGNOSIS — R2689 Other abnormalities of gait and mobility: Secondary | ICD-10-CM | POA: Diagnosis not present

## 2019-04-06 DIAGNOSIS — D631 Anemia in chronic kidney disease: Secondary | ICD-10-CM | POA: Diagnosis not present

## 2019-04-06 DIAGNOSIS — N183 Chronic kidney disease, stage 3 (moderate): Secondary | ICD-10-CM | POA: Diagnosis not present

## 2019-04-18 ENCOUNTER — Ambulatory Visit: Payer: Medicare Other | Admitting: Internal Medicine

## 2019-04-18 ENCOUNTER — Telehealth: Payer: Self-pay

## 2019-04-18 NOTE — Telephone Encounter (Signed)
PLAN OF CARE SIGNED AND PLACED IN [facility] FOLDER °

## 2019-04-20 DIAGNOSIS — N183 Chronic kidney disease, stage 3 (moderate): Secondary | ICD-10-CM | POA: Diagnosis not present

## 2019-04-20 DIAGNOSIS — D72820 Lymphocytosis (symptomatic): Secondary | ICD-10-CM | POA: Diagnosis not present

## 2019-04-20 DIAGNOSIS — D631 Anemia in chronic kidney disease: Secondary | ICD-10-CM | POA: Diagnosis not present

## 2019-05-04 DIAGNOSIS — M19012 Primary osteoarthritis, left shoulder: Secondary | ICD-10-CM | POA: Diagnosis not present

## 2019-05-04 DIAGNOSIS — M25512 Pain in left shoulder: Secondary | ICD-10-CM | POA: Diagnosis not present

## 2019-05-13 HISTORY — PX: FOOT SURGERY: SHX648

## 2019-05-16 ENCOUNTER — Ambulatory Visit (INDEPENDENT_AMBULATORY_CARE_PROVIDER_SITE_OTHER): Payer: Medicare Other | Admitting: Adult Health

## 2019-05-16 ENCOUNTER — Other Ambulatory Visit: Payer: Self-pay

## 2019-05-16 ENCOUNTER — Encounter: Payer: Self-pay | Admitting: Adult Health

## 2019-05-16 VITALS — BP 140/88 | HR 73 | Temp 98.3°F | Resp 16 | Ht 60.0 in | Wt 86.0 lb

## 2019-05-16 DIAGNOSIS — F329 Major depressive disorder, single episode, unspecified: Secondary | ICD-10-CM

## 2019-05-16 DIAGNOSIS — R300 Dysuria: Secondary | ICD-10-CM | POA: Diagnosis not present

## 2019-05-16 DIAGNOSIS — R7309 Other abnormal glucose: Secondary | ICD-10-CM

## 2019-05-16 DIAGNOSIS — F32A Depression, unspecified: Secondary | ICD-10-CM

## 2019-05-16 DIAGNOSIS — E538 Deficiency of other specified B group vitamins: Secondary | ICD-10-CM

## 2019-05-16 LAB — POCT URINALYSIS DIPSTICK
Bilirubin, UA: NEGATIVE
Blood, UA: NEGATIVE
Glucose, UA: NEGATIVE
Ketones, UA: NEGATIVE
Leukocytes, UA: NEGATIVE
Nitrite, UA: NEGATIVE
Protein, UA: NEGATIVE
Spec Grav, UA: <=1.005 — AB (ref 1.010–1.025)
Urobilinogen, UA: 0.2 E.U./dL
pH, UA: 5 (ref 5.0–8.0)

## 2019-05-16 LAB — GLUCOSE, POCT (MANUAL RESULT ENTRY): POC Glucose: 90 mg/dl (ref 70–99)

## 2019-05-16 MED ORDER — CYANOCOBALAMIN 1000 MCG/ML IJ SOLN
1000.0000 ug | Freq: Once | INTRAMUSCULAR | Status: AC
  Administered 2019-05-16: 1000 ug via INTRAMUSCULAR

## 2019-05-16 NOTE — Progress Notes (Signed)
[facility], [place] [address]  Internal MEDICINE  Office Visit Note  Patient Name: Stephanie Harris  [id number]  [id number]  Date of Service: 05/16/2019  Chief Complaint  Patient presents with  . Fatigue    shakey .  Marland Kitchen balance issues    3 falls in the last week   . Weight Loss    lost 10 pounds      HPI Pt is here for a sick visit. She reports she has lost 12 pounds in two months since last visit.  She reports she has been feeling weak, and shaky.  She has been having balance issues and reports 3 falls in the last week.  She has bruising to her legs.  She does report that her baby sister just died of pancreatic cancer, she has been very down since then.      Current Medication:  Outpatient Encounter Medications as of 05/16/2019  Medication Sig  . acetaminophen (TYLENOL) 500 MG tablet Take 1,000 mg by mouth every 8 (eight) hours.  Marland Kitchen amLODipine (NORVASC) 5 MG tablet Take 1 tablet (5 mg total) by mouth daily.  Marland Kitchen aspirin 81 MG chewable tablet Chew 81 mg by mouth daily.  Marland Kitchen aspirin EC 325 MG tablet Take 1 tablet (325 mg total) by mouth daily.  Marland Kitchen atorvastatin (LIPITOR) 40 MG tablet Take 0.5 tablets (20 mg total) by mouth daily at 6 PM.  . carvedilol (COREG) 3.125 MG tablet   . levothyroxine (SYNTHROID) 50 MCG tablet Take 1 tablet (50 mcg total) by mouth daily. Name brand is medically necessary.  Marland Kitchen linaclotide (LINZESS) 145 MCG CAPS capsule Take 1 capsule (145 mcg total) by mouth daily before breakfast.  . lisinopril (PRINIVIL,ZESTRIL) 2.5 MG tablet Take 2 tablets (5 mg total) by mouth daily.  . mupirocin cream (BACTROBAN) 2 % Apply over any open areas of skin of bilateral feet twice a day for 7-10 days, or until healed  . ondansetron (ZOFRAN ODT) 4 MG disintegrating tablet Take 1 tablet (4 mg total) by mouth every 8 (eight) hours as needed for nausea or vomiting.  Marland Kitchen oxyCODONE (OXY IR/ROXICODONE) 5 MG immediate release tablet Take 2.5-5 mg by mouth  every 6 (six) hours as needed for moderate pain or severe pain.  . phenazopyridine (PYRIDIUM) 100 MG tablet Take 2 tablets (200 mg total) by mouth 3 (three) times daily as needed for pain.  . potassium chloride (K-DUR) 10 MEQ tablet   . silver sulfADIAZINE (SILVADENE) 1 % cream Apply to affected area daily  . [DISCONTINUED] ciprofloxacin (CIPRO) 250 MG tablet Take 1 tablet (250 mg total) by mouth 2 (two) times daily. (Patient not taking: Reported on 05/16/2019)  . [DISCONTINUED] mupirocin ointment (BACTROBAN) 2 %   . [EXPIRED] cyanocobalamin ((VITAMIN B-12)) injection 1,000 mcg    No facility-administered encounter medications on file as of 05/16/2019.       Medical History: Past Medical History:  Diagnosis Date  . Acute bronchiolitis   . Acute pharyngitis   . Allergic rhinitis due to pollen   . Anemia, unspecified    CKD and LGL  . Anxiety state    unspecified  . Arthritis    "knees, legs" (03/18/2018)  . Asthma without status asthmaticus    unspecified  . B12 deficiency   . Beta thalassemia trait   . Carotid artery occlusion   . Cellulitis and abscess of leg, except foot   . Cervical spondylosis   . Cervicalgia   . Chronic  kidney disease   . Chronic lower back pain   . CML (chronic myelocytic leukemia) ([place])    Onc at [facility]  . Complication of anesthesia    "last back surgery they liked to never get me awake" (03/18/2018)  . Coronary artery disease 2009   heart attack with stent  . Coronary atherosclerosis of native coronary artery   . Depressive disorder    not elsewhere classified  . Difficulty in walking   . Disorder of breast, unspecified   . Dizziness and giddiness   . Dysuria   . Esophageal reflux   . Essential hypertension    unspecified  . Family history of adverse reaction to anesthesia    "liked to never get my sister awake" (03/18/2018)  . Head injury    unspecified  . Headache    "q time I have a stroke" (03/18/2018)  . Heart disease   . Hematuria,  unspecified   . History of blood transfusion    "had 22 when they found out I had leukemia" (03/18/2018)  . Hypersomnia, unspecified   . Hypertension   . Hypopotassemia   . Hypothyroidism   . ICH (intracerebral hemorrhage) ([place])   . Ill-defined cerebrovascular disease    other  . Insomnia    unspecified  . 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 Leukemia ([place])   . Lower urinary tract infection   . Lumbago   . Mini stroke ([place])    x years  . Mixed hyperlipidemia   . Myocardial infarction ([place]) ~2017  . Nontoxic nodular goiter    unspecified  . Occlusion and stenosis of unspecified carotid artery    without mention of cerebral infarction  . Osteoarthritis   . Osteoporosis   . Other constipation   . Other vitamin B12 deficiency anemias   . Otitis media, unspecified, unspecified ear   . Ovarian failure    unspecified  . Pain in limb   . Panic disorder without agoraphobia   . Peripheral vascular disease ([place])    unspecified  . Phlebitis of left arm   . RA (rheumatoid arthritis) ([place])   . Sigmoid polyp 1998  . Sleep disturbance    unspecified  . Stroke ([place]) 02/2017   "lots of mini strokes; big one 02/2017; that one made me weak in my knees,; never fully recovered" (03/18/2018)  . Syncope and collapse   . Thalassemia   . TIA (transient ischemic attack)    2000 and 2008 right carotid stent 08/14/2009 on Plavix  . TIA (transient ischemic attack) 03/18/2018  . Transient disorder of initiating or maintaining sleep   . Type II diabetes mellitus ([place])   . Varicose veins of bilateral lower extremities with other complications   . Varicose veins of bilateral lower extremities with other complications   . Vision abnormalities      Vital Signs: BP 140/88   Pulse 73   Temp 98.3 F (36.8 C)   Resp 16   Ht 5' (1.524 m)   Wt 86 lb (39 kg)   SpO2 99%   BMI 16.80 kg/m    Review of Systems  Constitutional: Negative for chills, fatigue and unexpected weight  change.  HENT: Negative for congestion, rhinorrhea, sneezing and sore throat.   Eyes: Negative for photophobia, pain and redness.  Respiratory: Negative for cough, chest tightness and shortness of breath.   Cardiovascular: Negative for chest pain and palpitations.  Gastrointestinal: Negative for abdominal pain, constipation, diarrhea, nausea and vomiting.  Endocrine:  Negative.   Genitourinary: Negative for dysuria and frequency.  Musculoskeletal: Negative for arthralgias, back pain, joint swelling and neck pain.  Skin: Negative for rash.  Allergic/Immunologic: Negative.   Neurological: Negative for tremors and numbness.  Hematological: Negative for adenopathy. Does not bruise/bleed easily.  Psychiatric/Behavioral: Negative for behavioral problems and sleep disturbance. The patient is not nervous/anxious.     Physical Exam Vitals signs and nursing note reviewed.  Constitutional:      General: She is not in acute distress.    Appearance: She is well-developed. She is not diaphoretic.  HENT:     Head: Normocephalic and atraumatic.     Mouth/Throat:     Pharynx: No oropharyngeal exudate.  Eyes:     Pupils: Pupils are equal, round, and reactive to light.  Neck:     Musculoskeletal: Normal range of motion and neck supple.     Thyroid: No thyromegaly.     Vascular: No JVD.     Trachea: No tracheal deviation.  Cardiovascular:     Rate and Rhythm: Normal rate and regular rhythm.     Heart sounds: Normal heart sounds. No murmur. No friction rub. No gallop.   Pulmonary:     Effort: Pulmonary effort is normal. No respiratory distress.     Breath sounds: Normal breath sounds. No wheezing or rales.  Chest:     Chest wall: No tenderness.  Abdominal:     Palpations: Abdomen is soft.     Tenderness: There is no abdominal tenderness. There is no guarding.  Musculoskeletal: Normal range of motion.  Lymphadenopathy:     Cervical: No cervical adenopathy.  Skin:    General: Skin is warm and  dry.  Neurological:     Mental Status: She is alert and oriented to person, place, and time.     Cranial Nerves: No cranial nerve deficit.  Psychiatric:        Behavior: Behavior normal.        Thought Content: Thought content normal.        Judgment: Judgment normal.    Assessment/Plan: 1. Depression, acute Likely cause of patients weakness is depression and decreased b12. We discussed trying B12 injection to see if that will help her improve.  We also discussed that her weight loss and weakness could be result of continued weakness, and aging.   2. Abnormal glucose level Glucose checked due to weakness.  90 mg/dl today. - POCT glucose (manual entry)  3. B12 deficiency Pt given B12 injection - cyanocobalamin ((VITAMIN B-12)) injection 1,000 mcg  4. Dysuria Urine clear - POCT Urinalysis Dipstick  General Counseling: Blanche verbalizes understanding of the findings of todays visit and agrees with plan of treatment. I have discussed any further diagnostic evaluation that may be needed or ordered today. We also reviewed her medications today. she has been encouraged to call the office with any questions or concerns that should arise related to todays visit.   Orders Placed This Encounter  Procedures  . POCT Urinalysis Dipstick  . POCT glucose (manual entry)    Meds ordered this encounter  Medications  . cyanocobalamin ((VITAMIN B-12)) injection 1,000 mcg    Time spent: 15 Minutes  This patient was seen by Orson Gear AGNP-C in Collaboration with Dr [name] as a part of collaborative care agreement.  Kendell Bane AGNP-C Internal Medicine

## 2019-05-17 ENCOUNTER — Telehealth: Payer: Self-pay

## 2019-05-17 NOTE — Telephone Encounter (Signed)
ORDERS SIGNED AND PLACED IN KINDRED FOLDER. °

## 2019-05-25 ENCOUNTER — Emergency Department
Admission: EM | Admit: 2019-05-25 | Discharge: 2019-05-25 | Disposition: A | Discharge status: Home / Self Care | Payer: Medicare Other | Attending: Emergency Medicine | Admitting: Emergency Medicine

## 2019-05-25 ENCOUNTER — Other Ambulatory Visit: Payer: Self-pay

## 2019-05-25 ENCOUNTER — Emergency Department: Payer: Medicare Other

## 2019-05-25 DIAGNOSIS — N183 Chronic kidney disease, stage 3 unspecified: Secondary | ICD-10-CM | POA: Insufficient documentation

## 2019-05-25 DIAGNOSIS — W19XXXA Unspecified fall, initial encounter: Secondary | ICD-10-CM

## 2019-05-25 DIAGNOSIS — Z856 Personal history of leukemia: Secondary | ICD-10-CM | POA: Insufficient documentation

## 2019-05-25 DIAGNOSIS — Z79899 Other long term (current) drug therapy: Secondary | ICD-10-CM | POA: Diagnosis not present

## 2019-05-25 DIAGNOSIS — Y929 Unspecified place or not applicable: Secondary | ICD-10-CM | POA: Diagnosis not present

## 2019-05-25 DIAGNOSIS — Y939 Activity, unspecified: Secondary | ICD-10-CM | POA: Insufficient documentation

## 2019-05-25 DIAGNOSIS — Z955 Presence of coronary angioplasty implant and graft: Secondary | ICD-10-CM | POA: Insufficient documentation

## 2019-05-25 DIAGNOSIS — J45909 Unspecified asthma, uncomplicated: Secondary | ICD-10-CM | POA: Insufficient documentation

## 2019-05-25 DIAGNOSIS — S79911A Unspecified injury of right hip, initial encounter: Secondary | ICD-10-CM | POA: Diagnosis present

## 2019-05-25 DIAGNOSIS — E1122 Type 2 diabetes mellitus with diabetic chronic kidney disease: Secondary | ICD-10-CM | POA: Diagnosis not present

## 2019-05-25 DIAGNOSIS — Z7982 Long term (current) use of aspirin: Secondary | ICD-10-CM | POA: Insufficient documentation

## 2019-05-25 DIAGNOSIS — I2510 Atherosclerotic heart disease of native coronary artery without angina pectoris: Secondary | ICD-10-CM | POA: Diagnosis not present

## 2019-05-25 DIAGNOSIS — Z91040 Latex allergy status: Secondary | ICD-10-CM | POA: Diagnosis not present

## 2019-05-25 DIAGNOSIS — E039 Hypothyroidism, unspecified: Secondary | ICD-10-CM | POA: Insufficient documentation

## 2019-05-25 DIAGNOSIS — Y999 Unspecified external cause status: Secondary | ICD-10-CM | POA: Insufficient documentation

## 2019-05-25 DIAGNOSIS — Z96651 Presence of right artificial knee joint: Secondary | ICD-10-CM | POA: Diagnosis not present

## 2019-05-25 DIAGNOSIS — S7001XA Contusion of right hip, initial encounter: Secondary | ICD-10-CM

## 2019-05-25 DIAGNOSIS — I129 Hypertensive chronic kidney disease with stage 1 through stage 4 chronic kidney disease, or unspecified chronic kidney disease: Secondary | ICD-10-CM | POA: Diagnosis not present

## 2019-05-25 MED ORDER — LEVOTHYROXINE SODIUM 50 MCG PO TABS
50.0000 ug | ORAL_TABLET | Freq: Once | ORAL | Status: AC
  Administered 2019-05-25: 50 ug via ORAL
  Filled 2019-05-25: qty 1

## 2019-05-25 MED ORDER — CARVEDILOL 6.25 MG PO TABS
3.1250 mg | ORAL_TABLET | Freq: Once | ORAL | Status: AC
  Administered 2019-05-25: 3.125 mg via ORAL
  Filled 2019-05-25: qty 1

## 2019-05-25 NOTE — ED Notes (Signed)
Attempted to ambulate pt to toilet and pt refused

## 2019-05-25 NOTE — ED Notes (Addendum)
[place]y alarm in place and on  Pt denies having her brief changed and given phone to make a call

## 2019-05-25 NOTE — ED Notes (Signed)
Dr Kinner at bedside. 

## 2019-05-25 NOTE — ED Notes (Signed)
Dr Corky Downs notified of pt's need for medications

## 2019-05-25 NOTE — ED Notes (Addendum)
Pt concerned about BP and wanted to know if she could get her medicine- will notify Dr Corky Downs

## 2019-05-25 NOTE — ED Provider Notes (Signed)
[facility] Emergency Department Provider Note   ____________________________________________    I have reviewed the triage vital signs and the nursing notes.   HISTORY  Chief Complaint Fall     HPI Stephanie Harris is a 85 y.o. female who presents after a fall.  Patient reports she has a long history of balance issues which have led to falls.  She reports she lost her balance and fell onto her right hip.  She complains of pain however she has been able to bear weight on the right leg.  No other injuries reported today.  She is not on blood thinners.  She does use a walker  Past Medical History:  Diagnosis Date  . Acute bronchiolitis   . Acute pharyngitis   . Allergic rhinitis due to pollen   . Anemia, unspecified    CKD and LGL  . Anxiety state    unspecified  . Arthritis    "knees, legs" (03/18/2018)  . Asthma without status asthmaticus    unspecified  . B12 deficiency   . Beta thalassemia trait   . Carotid artery occlusion   . Cellulitis and abscess of leg, except foot   . Cervical spondylosis   . Cervicalgia   . Chronic kidney disease   . Chronic lower back pain   . CML (chronic myelocytic leukemia) ([place])    [facility] at [facility]  . Complication of anesthesia    "last back surgery they liked to never get me awake" (03/18/2018)  . Coronary artery disease 2009   heart attack with stent  . Coronary atherosclerosis of native coronary artery   . Depressive disorder    not elsewhere classified  . Difficulty in walking   . Disorder of breast, unspecified   . Dizziness and giddiness   . Dysuria   . Esophageal reflux   . Essential hypertension    unspecified  . Family history of adverse reaction to anesthesia    "liked to never get my sister awake" (03/18/2018)  . Head injury    unspecified  . Headache    "q time I have a stroke" (03/18/2018)  . Heart disease   . Hematuria, unspecified   . History of blood transfusion    "had 22 when they  found out I had leukemia" (03/18/2018)  . Hypersomnia, unspecified   . Hypertension   . Hypopotassemia   . Hypothyroidism   . ICH (intracerebral hemorrhage) ([place])   . Ill-defined cerebrovascular disease    other  . Insomnia    unspecified  . 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 Leukemia ([place])   . Lower urinary tract infection   . Lumbago   . Mini stroke ([place])    x years  . Mixed hyperlipidemia   . Myocardial infarction ([place]) ~2017  . Nontoxic nodular goiter    unspecified  . Occlusion and stenosis of unspecified carotid artery    without mention of cerebral infarction  . Osteoarthritis   . Osteoporosis   . Other constipation   . Other vitamin B12 deficiency anemias   . Otitis media, unspecified, unspecified ear   . Ovarian failure    unspecified  . Pain in limb   . Panic disorder without agoraphobia   . Peripheral vascular disease ([place])    unspecified  . Phlebitis of left arm   . RA (rheumatoid arthritis) ([place])   . Sigmoid polyp 1998  . Sleep disturbance    unspecified  . Stroke [facility])  02/2017   "lots of mini strokes; big one 02/2017; that one made me weak in my knees,; never fully recovered" (03/18/2018)  . Syncope and collapse   . Thalassemia   . TIA (transient ischemic attack)    2000 and 2008 right carotid stent 08/14/2009 on Plavix  . TIA (transient ischemic attack) 03/18/2018  . Transient disorder of initiating or maintaining sleep   . Type II diabetes mellitus ([place])   . Varicose veins of bilateral lower extremities with other complications   . Varicose veins of bilateral lower extremities with other complications   . Vision abnormalities     Patient Active Problem List   Diagnosis Date Noted  . Weakness of both legs 03/12/2019  . Abnormality of gait 03/12/2019  . Peripheral edema 01/12/2019  . Skin lesion of right lower extremity 12/06/2018  . Diabetes mellitus without complication ([place]) 123XX123  . Urinary tract infection without  hematuria 11/06/2018  . Bladder pain 11/06/2018  . Large granular lymphocytosis 10/05/2018  . Hypoglycemia 08/31/2018  . Nausea 08/31/2018  . B12 deficiency 08/31/2018  . Mini stroke ([place]) 08/30/2018  . Acquired hypothyroidism 07/11/2018  . Dyslipidemia 07/11/2018  . Chronic renal disease, stage III 07/11/2018  . Sacral fracture, closed ([place]) 07/11/2018  . Traumatic hematoma of buttock 07/11/2018  . Uses walker 07/07/2018  . Frequent falls 07/02/2018  . Sacral fracture, closed ([place]) 07/01/2018  . Traumatic hematoma of buttock 07/01/2018  . At risk for domestic violence 05/06/2018  . Pain in right foot 04/06/2018  . Cerebrovascular accident (CVA) ([place]) 04/05/2018  . Essential hypertension, benign 04/05/2018  . Carotid artery calcification, bilateral 04/05/2018  . Occlusion and stenosis of both vertebral arteries   . Stenosis of carotid artery   . Hypertensive emergency   . S/P kyphoplasty 12/16/2017  . Age-related osteoporosis with current pathological fracture 11/10/2017  . Protein-calorie malnutrition, severe 11/08/2017  . Lumbar compression fracture ([place]) 11/05/2017  . Spondylosis of lumbar region without myelopathy or radiculopathy 10/08/2017  . Chronic midline low back pain 08/29/2017  . Closed wedge compression fracture of first lumbar vertebra with delayed healing 08/29/2017  . Anemia in stage 3 chronic kidney disease 06/02/2017  . Left-sided nontraumatic intracerebral hemorrhage ([place]) 03/23/2017  . Primary osteoarthritis of left shoulder 12/31/2016  . Primary osteoarthritis of one knee, left 12/31/2016  . Closed fracture of proximal end of left humerus with routine healing 08/28/2016  . Gross hematuria 06/25/2016  . Incomplete emptying of bladder 06/25/2016  . Moderate mitral insufficiency 10/31/2015  . Syncope and collapse 10/31/2015  . Continuous leakage of urine 08/30/2015  . Frequent UTI 08/30/2015  . Muscle weakness of lower extremity 03/20/2015  . Chest pain  03/14/2015  . TIA (transient ischemic attack) 01/29/2015  . Allergic arthritis, hand   . Adhesive capsulitis of left shoulder 12/13/2014  . SI joint arthritis 07/05/2014  . Mixed hyperlipidemia 05/22/2014  . Temporary cerebral vascular dysfunction 05/22/2014  . Irritable bowel syndrome with constipation 05/03/2014  . Dysphagia 05/03/2014  . Weight loss 05/03/2014  . Bilateral carotid artery disease ([place]) 04/18/2014  . Carotid atherosclerosis 04/18/2014  . Cerebral infarction, unspecified ([place]) 04/18/2014  . Cerebral artery occlusion with cerebral infarction ([place]) 01/11/2014  . Coronary atherosclerosis 01/11/2014  . Closed fracture of lumbar vertebra ([place]) 12/19/2013  . Non-traumatic compression fracture of second lumbar vertebra ([place]) 12/19/2013  . Fall in home 05/19/2013  . Compression fracture of T12 vertebra ([place]) 04/27/2013  . Closed fracture of dorsal (thoracic) vertebra ([place]) 04/27/2013  . 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 Anemia due to stage 3 chronic kidney disease 06/15/2012  . Lymphoid leukemia ([place]) 06/15/2012  . Unspecified visual loss 05/20/2012  . Degenerative drusen 05/06/2012  . Drusen, retina 05/06/2012    Past Surgical History:  Procedure Laterality Date  . BACK SURGERY    . CAROTID ENDARTERECTOMY Left   . CAROTID STENT INSERTION Right    "have 2 stents in there" (03/18/2018)  . CATARACT EXTRACTION W/ INTRAOCULAR LENS  IMPLANT, BILATERAL Bilateral 06/16/2001 - 05/28/2003   +23.5D     22.5D  . CHOLECYSTECTOMY OPEN  1980  . COLONOSCOPY  2010  . CORONARY ANGIOPLASTY WITH STENT PLACEMENT    . EYELID SURGERY Bilateral 2005   BUL BLEPH  . FOOT SURGERY  05/13/2019  . FRACTURE SURGERY    . JOINT REPLACEMENT    . KYPHOPLASTY N/A 08/12/2017   Procedure: KYPHOPLASTY;  Surgeon: Hessie Knows, MD;  Location: [facility] ORS;  Service: Orthopedics;  Laterality: N/A;  . KYPHOPLASTY N/A 11/08/2017   Procedure: Hewitt Shorts;  Surgeon: Hessie Knows, MD;  Location: [facility] ORS;  Service: Orthopedics;  Laterality: N/A;  . PERCUTANEOUS PLACEMENT INTRAVASCULAR STENT CERVICAL CAROTID ARTERY  06/14/2009  . ROTATOR CUFF REPAIR Left   . TOTAL ABDOMINAL HYSTERECTOMY  1978   WITH REMOVAL TUBES & /OR OVARIES  . TOTAL KNEE ARTHROPLASTY Right     Prior to Admission medications   Medication Sig Start Date End Date Taking? Authorizing Provider  acetaminophen (TYLENOL) 500 MG tablet Take 1,000 mg by mouth every 8 (eight) hours. 07/07/18   [provider]  amLODipine (NORVASC) 5 MG tablet Take 1 tablet (5 mg total) by mouth daily. 10/04/18   Dustin Flock, MD  aspirin 81 MG chewable tablet Chew 81 mg by mouth daily.    [provider]  aspirin EC 325 MG tablet Take 1 tablet (325 mg total) by mouth daily. 10/03/18 11/07/19  Dustin Flock, MD  atorvastatin (LIPITOR) 40 MG tablet Take 0.5 tablets (20 mg total) by mouth daily at 6 PM. 10/03/18   Dustin Flock, MD  carvedilol (COREG) 3.125 MG tablet  03/22/19   [provider]  levothyroxine (SYNTHROID) 50 MCG tablet Take 1 tablet (50 mcg total) by mouth daily. Name brand is medically necessary. 01/09/19   Ronnell Freshwater, NP  linaclotide (LINZESS) 145 MCG CAPS capsule Take 1 capsule (145 mcg total) by mouth daily before breakfast. 08/30/18   Ronnell Freshwater, NP  lisinopril (PRINIVIL,ZESTRIL) 2.5 MG tablet Take 2 tablets (5 mg total) by mouth daily. 10/04/18   Dustin Flock, MD  mupirocin cream Drue Stager) 2 % Apply over any open areas of skin of bilateral feet twice a day for 7-10 days, or until healed 02/03/19 02/03/20  Duffy Bruce, MD  ondansetron (ZOFRAN ODT) 4 MG disintegrating tablet Take 1 tablet (4 mg total) by mouth every 8 (eight) hours as needed for nausea or vomiting. 09/16/18   Ronnell Freshwater, NP  oxyCODONE (OXY IR/ROXICODONE) 5 MG immediate release tablet Take 2.5-5 mg by mouth every 6 (six) hours as needed for moderate pain or severe pain.    [provider]  phenazopyridine (PYRIDIUM) 100 MG tablet Take 2 tablets (200 mg total) by mouth 3 (three) times daily as needed for pain. 10/21/18   Ronnell Freshwater, NP  potassium chloride (K-DUR) 10 MEQ tablet  01/26/19   [provider]  silver sulfADIAZINE (SILVADENE) 1 % cream Apply to affected area daily 12/08/18 12/08/19  Rosana Hoes,  Arn Medal, MD     Allergies Latex, Meperidine, Penicillins, Cefuroxime axetil, Codeine, and Propoxyphene  Family History  Problem Relation Age of Onset  . Acute myelogenous leukemia Brother   . Anemia Brother   . Coronary artery disease Brother   . Heart disease Brother   . Stroke Brother   . Heart failure Brother   . Stroke Mother   . Anemia Mother   . Heart disease Mother   . Heart failure Mother   . Hypertension Mother   . Osteoarthritis Mother   . Rheum arthritis Mother   . Heart attack Father   . Anemia Sister   . Cataracts Sister   . Osteoarthritis Sister   . Rheum arthritis Sister   . Stroke Sister   . Heart disease Sister   . Thalassemia Sister   . Thalassemia Sister   . Blindness Neg Hx   . Glaucoma Neg Hx   . Macular degeneration Neg Hx   . Strabismus Neg Hx   . Vision loss Neg Hx   . Basal cell carcinoma Neg Hx   . GU problems Neg Hx   . Kidney cancer Neg Hx   . Melanoma Neg Hx   . Kidney disease Neg Hx   . Prostate cancer Neg Hx   . Squamous cell carcinoma Neg Hx     Social History Social History   Tobacco Use  . Smoking status: Never Smoker  . Smokeless tobacco: Never Used  Substance Use Topics  . Alcohol use: No  . Drug use: No    Review of Systems  Constitutional: No fever/chills Eyes: No visual changes.  ENT: No neck pain Cardiovascular: Denies chest pain. Respiratory: Denies shortness of breath. Gastrointestinal: No abdominal pain.  Genitourinary: Negative for groin injury Musculoskeletal: As above Skin: Negative for rash. Neurological: Negative for headaches    ____________________________________________   PHYSICAL EXAM:  VITAL SIGNS: ED Triage Vitals  Enc Vitals Group     BP 05/25/19 1047 (!) 184/86     Pulse Rate 05/25/19 1047 79     Resp 05/25/19 1047 18     Temp 05/25/19 1047 97.6 F (36.4 C)     Temp Source 05/25/19 1047 Oral     SpO2 05/25/19 1047 100 %     Weight 05/25/19 1051 39 kg (86 lb)     Height 05/25/19 1051 1.524 m (5')     Head Circumference --      Peak Flow --      Pain Score 05/25/19 1051 8     Pain Loc --      Pain Edu? --      Excl. in [place]? --     Constitutional: Alert and oriented.  Eyes: Conjunctivae are normal.  Head: Atraumatic Nose: No congestion/rhinnorhea. Mouth/Throat: Mucous membranes are moist.   Neck:  Painless ROM, no vertebral terms palpation Cardiovascular: Normal rate, regular rhythm. Grossly normal heart sounds.  Good peripheral circulation.  No chest wall tenderness palpation Respiratory: Normal respiratory effort.  No retractions. Gastrointestinal: Soft and nontender. No distention.  No CVA tenderness.  Reassuring exam  Musculoskeletal: Back: No vertebral has palpation.  No chest wall tenderness palpation.  Moves all extremities well.  No pain with axial load on both hips.  Mild tenderness palpation along the greater trochanter on the right hip. Neurologic:  Normal speech and language. No gross focal neurologic deficits are appreciated.  Skin:  Skin is warm, dry and intact.  Several bruises in various stages of healing  Psychiatric: Mood and affect are normal. Speech and behavior are normal.  ____________________________________________   LABS (all labs ordered are listed, but only abnormal results are displayed)  Labs Reviewed - No data to display ____________________________________________  EKG  None ____________________________________________  RADIOLOGY  Hip x-ray negative ____________________________________________   PROCEDURES  Procedure(s) performed: No   Procedures   Critical Care performed: No ____________________________________________   INITIAL IMPRESSION / ASSESSMENT AND PLAN / ED COURSE  Pertinent labs & imaging results that were available during my care of the patient were reviewed by me and considered in my medical decision making (see chart for details).  Patient's x-rays negative for fracture, suspect contusion.  She was able to ambulate with walker.  No indication for admission at this time, appropriate for discharge.    ____________________________________________   FINAL CLINICAL IMPRESSION(S) / ED DIAGNOSES  Final diagnoses:  Fall, initial encounter  Contusion of right hip, initial encounter        Note:  This document was prepared using Dragon voice recognition software and may include unintentional dictation errors.   Lavonia Drafts, MD 05/25/19 1451

## 2019-05-25 NOTE — ED Notes (Signed)
Patient transported to X-ray 

## 2019-05-25 NOTE — ED Triage Notes (Addendum)
Pt arrives via EMS after having a fall at home- pt typically uses wheelchair to get around but cannot get chair into bathroom- pt was reaching for items for her balance and fell- no LOC, pain in her upper R leg- no deformity noted- Pt has not taken home meds this morning

## 2019-05-25 NOTE — ED Notes (Signed)
Pt assisted to use phone

## 2019-05-25 NOTE — ED Notes (Signed)
Pt brief changed.  

## 2019-05-25 NOTE — ED Notes (Signed)
Pt called son to call her caregiver

## 2019-05-25 NOTE — ED Notes (Signed)
Pt back to room from xray.

## 2019-05-25 NOTE — ED Notes (Signed)
Pt still using phone

## 2019-05-25 NOTE — ED Notes (Signed)
Pt has on yellow arm band and nonskid socks- pt instructed to use call bell if she needs assistance

## 2019-05-29 ENCOUNTER — Encounter: Payer: Self-pay | Admitting: Emergency Medicine

## 2019-05-29 ENCOUNTER — Emergency Department: Payer: Medicare Other

## 2019-05-29 ENCOUNTER — Other Ambulatory Visit: Payer: Self-pay

## 2019-05-29 ENCOUNTER — Inpatient Hospital Stay
Admission: EM | Admit: 2019-05-29 | Discharge: 2019-05-31 | DRG: 689 | Disposition: A | Discharge status: Home Health | Payer: Medicare Other | Attending: Internal Medicine | Admitting: Internal Medicine

## 2019-05-29 DIAGNOSIS — I252 Old myocardial infarction: Secondary | ICD-10-CM

## 2019-05-29 DIAGNOSIS — E039 Hypothyroidism, unspecified: Secondary | ICD-10-CM | POA: Diagnosis present

## 2019-05-29 DIAGNOSIS — Z955 Presence of coronary angioplasty implant and graft: Secondary | ICD-10-CM

## 2019-05-29 DIAGNOSIS — N390 Urinary tract infection, site not specified: Principal | ICD-10-CM | POA: Diagnosis present

## 2019-05-29 DIAGNOSIS — R17 Unspecified jaundice: Secondary | ICD-10-CM | POA: Diagnosis present

## 2019-05-29 DIAGNOSIS — G9341 Metabolic encephalopathy: Secondary | ICD-10-CM | POA: Diagnosis present

## 2019-05-29 DIAGNOSIS — R778 Other specified abnormalities of plasma proteins: Secondary | ICD-10-CM | POA: Diagnosis not present

## 2019-05-29 DIAGNOSIS — Z823 Family history of stroke: Secondary | ICD-10-CM

## 2019-05-29 DIAGNOSIS — D696 Thrombocytopenia, unspecified: Secondary | ICD-10-CM | POA: Diagnosis present

## 2019-05-29 DIAGNOSIS — N183 Chronic kidney disease, stage 3 unspecified: Secondary | ICD-10-CM | POA: Diagnosis present

## 2019-05-29 DIAGNOSIS — Z66 Do not resuscitate: Secondary | ICD-10-CM | POA: Diagnosis present

## 2019-05-29 DIAGNOSIS — I2510 Atherosclerotic heart disease of native coronary artery without angina pectoris: Secondary | ICD-10-CM | POA: Diagnosis present

## 2019-05-29 DIAGNOSIS — Z91040 Latex allergy status: Secondary | ICD-10-CM

## 2019-05-29 DIAGNOSIS — Z95828 Presence of other vascular implants and grafts: Secondary | ICD-10-CM

## 2019-05-29 DIAGNOSIS — Z20828 Contact with and (suspected) exposure to other viral communicable diseases: Secondary | ICD-10-CM | POA: Diagnosis present

## 2019-05-29 DIAGNOSIS — Z7982 Long term (current) use of aspirin: Secondary | ICD-10-CM

## 2019-05-29 DIAGNOSIS — R4182 Altered mental status, unspecified: Secondary | ICD-10-CM

## 2019-05-29 DIAGNOSIS — Z8261 Family history of arthritis: Secondary | ICD-10-CM

## 2019-05-29 DIAGNOSIS — Z681 Body mass index (BMI) 19 or less, adult: Secondary | ICD-10-CM

## 2019-05-29 DIAGNOSIS — E43 Unspecified severe protein-calorie malnutrition: Secondary | ICD-10-CM | POA: Diagnosis present

## 2019-05-29 DIAGNOSIS — Z8249 Family history of ischemic heart disease and other diseases of the circulatory system: Secondary | ICD-10-CM

## 2019-05-29 DIAGNOSIS — F0390 Unspecified dementia without behavioral disturbance: Secondary | ICD-10-CM | POA: Diagnosis present

## 2019-05-29 DIAGNOSIS — I129 Hypertensive chronic kidney disease with stage 1 through stage 4 chronic kidney disease, or unspecified chronic kidney disease: Secondary | ICD-10-CM | POA: Diagnosis present

## 2019-05-29 DIAGNOSIS — Z8672 Personal history of thrombophlebitis: Secondary | ICD-10-CM

## 2019-05-29 DIAGNOSIS — Z96651 Presence of right artificial knee joint: Secondary | ICD-10-CM | POA: Diagnosis present

## 2019-05-29 DIAGNOSIS — Z856 Personal history of leukemia: Secondary | ICD-10-CM

## 2019-05-29 DIAGNOSIS — R7989 Other specified abnormal findings of blood chemistry: Secondary | ICD-10-CM | POA: Diagnosis present

## 2019-05-29 DIAGNOSIS — Z806 Family history of leukemia: Secondary | ICD-10-CM

## 2019-05-29 DIAGNOSIS — Z8673 Personal history of transient ischemic attack (TIA), and cerebral infarction without residual deficits: Secondary | ICD-10-CM

## 2019-05-29 LAB — CBC WITH DIFFERENTIAL/PLATELET
Abs Immature Granulocytes: 0.04 10*3/uL (ref 0.00–0.07)
Basophils Absolute: 0 10*3/uL (ref 0.0–0.1)
Basophils Relative: 1 %
Eosinophils Absolute: 0.1 10*3/uL (ref 0.0–0.5)
Eosinophils Relative: 1 %
HCT: 34.3 % — ABNORMAL LOW (ref 36.0–46.0)
Hemoglobin: 10.2 g/dL — ABNORMAL LOW (ref 12.0–15.0)
Immature Granulocytes: 1 %
Lymphocytes Relative: 36 %
Lymphs Abs: 2.2 10*3/uL (ref 0.7–4.0)
MCH: 18.4 pg — ABNORMAL LOW (ref 26.0–34.0)
MCHC: 29.7 g/dL — ABNORMAL LOW (ref 30.0–36.0)
MCV: 61.8 fL — ABNORMAL LOW (ref 80.0–100.0)
Monocytes Absolute: 0.9 10*3/uL (ref 0.1–1.0)
Monocytes Relative: 15 %
Neutro Abs: 3 10*3/uL (ref 1.7–7.7)
Neutrophils Relative %: 46 %
Platelets: 110 10*3/uL — ABNORMAL LOW (ref 150–400)
RBC: 5.55 MIL/uL — ABNORMAL HIGH (ref 3.87–5.11)
RDW: 18.5 % — ABNORMAL HIGH (ref 11.5–15.5)
Smear Review: NORMAL
WBC: 6.3 10*3/uL (ref 4.0–10.5)
nRBC: 0 % (ref 0.0–0.2)

## 2019-05-29 LAB — URINALYSIS, COMPLETE (UACMP) WITH MICROSCOPIC
Bacteria, UA: NONE SEEN
Bilirubin Urine: NEGATIVE
Glucose, UA: NEGATIVE mg/dL
Ketones, ur: 5 mg/dL — AB
Leukocytes,Ua: NEGATIVE
Nitrite: NEGATIVE
Protein, ur: 100 mg/dL — AB
Specific Gravity, Urine: 1.015 (ref 1.005–1.030)
WBC, UA: >50 WBC/hpf — ABNORMAL HIGH (ref 0–5)
pH: 8 (ref 5.0–8.0)

## 2019-05-29 LAB — COMPREHENSIVE METABOLIC PANEL
ALT: 11 U/L (ref 0–44)
AST: 29 U/L (ref 15–41)
Albumin: 4.2 g/dL (ref 3.5–5.0)
Alkaline Phosphatase: 25 U/L — ABNORMAL LOW (ref 38–126)
Anion gap: 8 (ref 5–15)
BUN: 32 mg/dL — ABNORMAL HIGH (ref 8–23)
CO2: 25 mmol/L (ref 22–32)
Calcium: 9.5 mg/dL (ref 8.9–10.3)
Chloride: 106 mmol/L (ref 98–111)
Creatinine, Ser: 1.18 mg/dL — ABNORMAL HIGH (ref 0.44–1.00)
GFR calc Af Amer: 48 mL/min — ABNORMAL LOW (ref >60)
GFR calc non Af Amer: 42 mL/min — ABNORMAL LOW (ref >60)
Glucose, Bld: 110 mg/dL — ABNORMAL HIGH (ref 70–99)
Potassium: 4.7 mmol/L (ref 3.5–5.1)
Sodium: 139 mmol/L (ref 135–145)
Total Bilirubin: 2.3 mg/dL — ABNORMAL HIGH (ref 0.3–1.2)
Total Protein: 7.5 g/dL (ref 6.5–8.1)

## 2019-05-29 LAB — HEMOGLOBIN A1C
Hgb A1c MFr Bld: 4.6 % — ABNORMAL LOW (ref 4.8–5.6)
Mean Plasma Glucose: 85.32 mg/dL

## 2019-05-29 LAB — TSH: TSH: 2.496 u[IU]/mL (ref 0.350–4.500)

## 2019-05-29 LAB — TROPONIN I (HIGH SENSITIVITY)
Troponin I (High Sensitivity): 129 ng/L (ref <18)
Troponin I (High Sensitivity): 124 ng/L (ref <18)

## 2019-05-29 LAB — GLUCOSE, CAPILLARY
Glucose-Capillary: 78 mg/dL (ref 70–99)
Glucose-Capillary: 113 mg/dL — ABNORMAL HIGH (ref 70–99)

## 2019-05-29 MED ORDER — SODIUM CHLORIDE 0.9 % IV SOLN
1.0000 g | INTRAVENOUS | Status: DC
  Administered 2019-05-30 – 2019-05-31 (×2): 1 g via INTRAVENOUS
  Filled 2019-05-29 (×2): qty 1

## 2019-05-29 MED ORDER — ONDANSETRON HCL 4 MG/2ML IJ SOLN: 4.0000 mg | Freq: Four times a day (QID) | INTRAMUSCULAR | Status: DC | PRN

## 2019-05-29 MED ORDER — POLYETHYLENE GLYCOL 3350 17 G PO PACK: 17.0000 g | PACK | Freq: Every day | ORAL | Status: DC | PRN

## 2019-05-29 MED ORDER — ASPIRIN 81 MG PO CHEW
81.0000 mg | CHEWABLE_TABLET | Freq: Every day | ORAL | Status: DC
  Administered 2019-05-30 – 2019-05-31 (×2): 81 mg via ORAL
  Filled 2019-05-29 (×2): qty 1

## 2019-05-29 MED ORDER — ASPIRIN 81 MG PO CHEW
324.0000 mg | CHEWABLE_TABLET | Freq: Once | ORAL | Status: AC
  Administered 2019-05-29: 14:00:00 324 mg via ORAL
  Filled 2019-05-29: qty 4

## 2019-05-29 MED ORDER — SODIUM CHLORIDE 0.9 % IV SOLN
1.0000 g | Freq: Once | INTRAVENOUS | Status: AC
  Administered 2019-05-29: 1 g via INTRAVENOUS
  Filled 2019-05-29: qty 10

## 2019-05-29 MED ORDER — ENOXAPARIN SODIUM 30 MG/0.3ML ~~LOC~~ SOLN
30.0000 mg | SUBCUTANEOUS | Status: DC
  Administered 2019-05-29 – 2019-05-30 (×2): 30 mg via SUBCUTANEOUS
  Filled 2019-05-29 (×3): qty 0.3

## 2019-05-29 MED ORDER — ACETAMINOPHEN 650 MG RE SUPP: 650.0000 mg | Freq: Four times a day (QID) | RECTAL | Status: DC | PRN

## 2019-05-29 MED ORDER — LABETALOL HCL 5 MG/ML IV SOLN: 10.0000 mg | Freq: Four times a day (QID) | INTRAVENOUS | Status: DC | PRN

## 2019-05-29 MED ORDER — LEVOTHYROXINE SODIUM 50 MCG PO TABS
50.0000 ug | ORAL_TABLET | Freq: Every day | ORAL | Status: DC
  Administered 2019-05-30 – 2019-05-31 (×2): 50 ug via ORAL
  Filled 2019-05-29 (×2): qty 1

## 2019-05-29 MED ORDER — INSULIN ASPART 100 UNIT/ML ~~LOC~~ SOLN
0.0000 [IU] | Freq: Three times a day (TID) | SUBCUTANEOUS | Status: DC
  Administered 2019-05-30: 1 [IU] via SUBCUTANEOUS
  Filled 2019-05-29: qty 1

## 2019-05-29 MED ORDER — CARVEDILOL 6.25 MG PO TABS
6.2500 mg | ORAL_TABLET | Freq: Two times a day (BID) | ORAL | Status: DC
  Administered 2019-05-29: 18:00:00 6.25 mg via ORAL
  Filled 2019-05-29: qty 1

## 2019-05-29 MED ORDER — ONDANSETRON HCL 4 MG PO TABS: 4.0000 mg | ORAL_TABLET | Freq: Four times a day (QID) | ORAL | Status: DC | PRN

## 2019-05-29 MED ORDER — ACETAMINOPHEN 325 MG PO TABS: 650.0000 mg | ORAL_TABLET | Freq: Four times a day (QID) | ORAL | Status: DC | PRN

## 2019-05-29 NOTE — ED Notes (Signed)
Report called to angela rn floor nurse.  

## 2019-05-29 NOTE — ED Notes (Signed)
Dr. Darvin Neighbours in room to assess patient.  Will continue to monitor.

## 2019-05-29 NOTE — H&P (Signed)
[facility] NAME: Stephanie Harris    MR#:  [id number]  DATE OF BIRTH:  [birth year]  DATE OF ADMISSION:  05/29/2019  PRIMARY CARE PHYSICIAN: [name], MD   REQUESTING/REFERRING PHYSICIAN: Dr. Charna Archer  CHIEF COMPLAINT:   Chief Complaint  Patient presents with  . Hallucinations    HISTORY OF PRESENT ILLNESS:  Stephanie Harris  is a 85 y.o. female with a known history of hypertension, diabetes mellitus, dementia, B12 deficiency, hypothyroidism who lives at home with her disabled son presents to the emergency room due to worsening confusion.  Patient is a poor historian.  She does not have any other concerns.  She was found to have a UTI in the emergency room along with mildly elevated troponin.  Family is unable to care for her at home and request that she be placed in a facility. She had put some Styrofoam plates on the stove and in the oven yesterday.  PAST MEDICAL HISTORY:   Past Medical History:  Diagnosis Date  . Acute bronchiolitis   . Acute pharyngitis   . Allergic rhinitis due to pollen   . Anemia, unspecified    CKD and LGL  . Anxiety state    unspecified  . Arthritis    "knees, legs" (03/18/2018)  . Asthma without status asthmaticus    unspecified  . B12 deficiency   . Beta thalassemia trait   . Carotid artery occlusion   . Cellulitis and abscess of leg, except foot   . Cervical spondylosis   . Cervicalgia   . Chronic kidney disease   . Chronic lower back pain   . CML (chronic myelocytic leukemia) ([place])    Onc at [facility]  . Complication of anesthesia    "last back surgery they liked to never get me awake" (03/18/2018)  . Coronary artery disease 2009   heart attack with stent  . Coronary atherosclerosis of native coronary artery   . Depressive disorder    not elsewhere classified  . Difficulty in walking   . Disorder of breast, unspecified   . Dizziness and giddiness   . Dysuria   . Esophageal reflux   .  Essential hypertension    unspecified  . Family history of adverse reaction to anesthesia    "liked to never get my sister awake" (03/18/2018)  . Head injury    unspecified  . Headache    "q time I have a stroke" (03/18/2018)  . Heart disease   . Hematuria, unspecified   . History of blood transfusion    "had 22 when they found out I had leukemia" (03/18/2018)  . Hypersomnia, unspecified   . Hypertension   . Hypopotassemia   . Hypothyroidism   . ICH (intracerebral hemorrhage) ([place])   . Ill-defined cerebrovascular disease    other  . Insomnia    unspecified  . 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 Leukemia ([place])   . Lower urinary tract infection   . Lumbago   . Mini stroke ([place])    x years  . Mixed hyperlipidemia   . Myocardial infarction ([place]) ~2017  . Nontoxic nodular goiter    unspecified  . Occlusion and stenosis of unspecified carotid artery    without mention of cerebral infarction  . Osteoarthritis   . Osteoporosis   . Other constipation   . Other vitamin B12 deficiency anemias   . Otitis media, unspecified, unspecified ear   . Ovarian failure  unspecified  . Pain in limb   . Panic disorder without agoraphobia   . Peripheral vascular disease ([place])    unspecified  . Phlebitis of left arm   . RA (rheumatoid arthritis) ([place])   . Sigmoid polyp 1998  . Sleep disturbance    unspecified  . Stroke ([place]) 02/2017   "lots of mini strokes; big one 02/2017; that one made me weak in my knees,; never fully recovered" (03/18/2018)  . Syncope and collapse   . Thalassemia   . TIA (transient ischemic attack)    2000 and 2008 right carotid stent 08/14/2009 on Plavix  . TIA (transient ischemic attack) 03/18/2018  . Transient disorder of initiating or maintaining sleep   . Type II diabetes mellitus ([place])   . Varicose veins of bilateral lower extremities with other complications   . Varicose veins of bilateral lower extremities with other complications   . Vision  abnormalities     PAST SURGICAL HISTORY:   Past Surgical History:  Procedure Laterality Date  . BACK SURGERY    . CAROTID ENDARTERECTOMY Left   . CAROTID STENT INSERTION Right    "have 2 stents in there" (03/18/2018)  . CATARACT EXTRACTION W/ INTRAOCULAR LENS  IMPLANT, BILATERAL Bilateral 06/16/2001 - 05/28/2003   +23.5D     22.5D  . CHOLECYSTECTOMY OPEN  1980  . COLONOSCOPY  2010  . CORONARY ANGIOPLASTY WITH STENT PLACEMENT    . EYELID SURGERY Bilateral 2005   BUL BLEPH  . FOOT SURGERY  05/13/2019  . FRACTURE SURGERY    . JOINT REPLACEMENT    . KYPHOPLASTY N/A 08/12/2017   Procedure: KYPHOPLASTY;  Surgeon: Hessie Knows, MD;  Location: [facility] ORS;  Service: Orthopedics;  Laterality: N/A;  . KYPHOPLASTY N/A 11/08/2017   Procedure: Hewitt Shorts;  Surgeon: Hessie Knows, MD;  Location: [facility] ORS;  Service: Orthopedics;  Laterality: N/A;  . PERCUTANEOUS PLACEMENT INTRAVASCULAR STENT CERVICAL CAROTID ARTERY  06/14/2009  . ROTATOR CUFF REPAIR Left   . TOTAL ABDOMINAL HYSTERECTOMY  1978   WITH REMOVAL TUBES & /OR OVARIES  . TOTAL KNEE ARTHROPLASTY Right     SOCIAL HISTORY:   Social History   Tobacco Use  . Smoking status: Never Smoker  . Smokeless tobacco: Never Used  Substance Use Topics  . Alcohol use: No    FAMILY HISTORY:   Family History  Problem Relation Age of Onset  . Acute myelogenous leukemia Brother   . Anemia Brother   . Coronary artery disease Brother   . Heart disease Brother   . Stroke Brother   . Heart failure Brother   . Stroke Mother   . Anemia Mother   . Heart disease Mother   . Heart failure Mother   . Hypertension Mother   . Osteoarthritis Mother   . Rheum arthritis Mother   . Heart attack Father   . Anemia Sister   . Cataracts Sister   . Osteoarthritis Sister   . Rheum arthritis Sister   . Stroke Sister   . Heart disease Sister   . Thalassemia Sister   . Thalassemia Sister   . Blindness Neg Hx   . Glaucoma Neg Hx   . Macular  degeneration Neg Hx   . Strabismus Neg Hx   . Vision loss Neg Hx   . Basal cell carcinoma Neg Hx   . GU problems Neg Hx   . Kidney cancer Neg Hx   . Melanoma Neg Hx   . Kidney disease Neg Hx   .  Prostate cancer Neg Hx   . Squamous cell carcinoma Neg Hx     DRUG ALLERGIES:   Allergies  Allergen Reactions  . Latex Rash  . Meperidine     Other reaction(s): Other (See Comments) Other Reaction: CNS Disorder  . Penicillins Other (See Comments)    Has patient had a PCN reaction causing immediate rash, facial/tongue/throat swelling, SOB or lightheadedness with hypotension: Unknown Has patient had a PCN reaction causing severe rash involving mucus membranes or skin necrosis: Unknown Has patient had a PCN reaction that required hospitalization: Unknown Has patient had a PCN reaction occurring within the last 10 years: Unknown If all of the above answers are "NO", then may proceed with Cephalosporin use.   . Cefuroxime Axetil Nausea And Vomiting  . Codeine Nausea And Vomiting and Nausea Only  . Propoxyphene Nausea Only    Other reaction(s): Vomiting    REVIEW OF SYSTEMS:   Review of Systems  Unable to perform ROS: Dementia    MEDICATIONS AT HOME:   Prior to Admission medications   Medication Sig Start Date End Date Taking? Authorizing Provider  aspirin 81 MG chewable tablet Chew 81 mg by mouth daily.   Yes [provider]  carvedilol (COREG) 3.125 MG tablet Take 3.125 mg by mouth 2 (two) times daily.  03/22/19  Yes [provider]  levothyroxine (SYNTHROID) 50 MCG tablet Take 1 tablet (50 mcg total) by mouth daily. Name brand is medically necessary. 01/09/19  Yes Ronnell Freshwater, NP  mupirocin cream (BACTROBAN) 2 % Apply over any open areas of skin of bilateral feet twice a day for 7-10 days, or until healed 02/03/19 02/03/20 Yes Duffy Bruce, MD     VITAL SIGNS:  Blood pressure (!) 179/79, pulse 72, temperature (!) 97.5 F (36.4 C), resp. rate 20, height 5'  (1.524 m), weight 39 kg, SpO2 100 %.  PHYSICAL EXAMINATION:  Physical Exam  GENERAL:  85 y.o.-year-old patient lying in the bed with no acute distress.  EYES: Pupils equal, round, reactive to light and accommodation. No scleral icterus. Extraocular muscles intact.  HEENT: Head atraumatic, normocephalic. Oropharynx and nasopharynx clear. No oropharyngeal erythema, moist oral mucosa  NECK:  Supple, no jugular venous distention. No thyroid enlargement, no tenderness.  LUNGS: Normal breath sounds bilaterally, no wheezing, rales, rhonchi. No use of accessory muscles of respiration.  CARDIOVASCULAR: S1, S2 normal. No murmurs, rubs, or gallops.  ABDOMEN: Soft, nontender, nondistended. Bowel sounds present. No organomegaly or mass.  EXTREMITIES: No pedal edema, cyanosis, or clubbing. + 2 pedal & radial pulses b/l.   NEUROLOGIC: Cranial nerves II through XII are intact. No focal Motor or sensory deficits appreciated b/l PSYCHIATRIC: The patient is alert and awake. confused SKIN: No obvious rash, lesion, or ulcer.   LABORATORY PANEL:   CBC Recent Labs  Lab 05/29/19 1242  WBC 6.3  HGB 10.2*  HCT 34.3*  PLT 110*   ------------------------------------------------------------------------------------------------------------------  Chemistries  Recent Labs  Lab 05/29/19 1242  NA 139  K 4.7  CL 106  CO2 25  GLUCOSE 110*  BUN 32*  CREATININE 1.18*  CALCIUM 9.5  AST 29  ALT 11  ALKPHOS 25*  BILITOT 2.3*   ------------------------------------------------------------------------------------------------------------------  Cardiac Enzymes No results for input(s): TROPONINI in the last 168 hours. ------------------------------------------------------------------------------------------------------------------  RADIOLOGY:  Ct Head Wo Contrast  Result Date: 05/29/2019 CLINICAL DATA:  Altered level of consciousness. EXAM: CT HEAD WITHOUT CONTRAST TECHNIQUE: Contiguous axial images were  obtained from the base of the skull through  the vertex without intravenous contrast. COMPARISON:  CT head without contrast 09/28/2018 FINDINGS: Brain: Advanced atrophy and white matter disease again noted. Remote anterior right frontal lobe infarct is stable. Remote lacunar infarcts are again noted within the cerebellum and right greater than left basal ganglia. No acute infarct, hemorrhage, or mass lesion is present. The ventricles are proportionate to the degree of atrophy. No significant extraaxial fluid collection is present. Vascular: Atherosclerotic calcifications are present within the cavernous internal carotid arteries. There is no hyperdense vessel. Skull: Hyperostosis frontalis internus is again noted. No focal lesions are present. No acute or healing fractures are present. Sinuses/Orbits: The paranasal sinuses and mastoid air cells are clear. Bilateral lens replacements are noted. Globes and orbits are otherwise unremarkable. IMPRESSION: 1. No acute intracranial abnormality. 2. Advanced atrophy and white matter disease is stable. 3. Stable remote infarcts involving the anterior right frontal lobe basal ganglia, and cerebellum. 4. Atherosclerosis. Electronically Signed   By: San Morelle M.D.   On: 05/29/2019 13:42   Dg Chest Portable 1 View  Result Date: 05/29/2019 CLINICAL DATA:  Pt arrival via [facility] from home c/o worsening hallucinations which lasted through the night and into this morning. Pt family called EMS due to concern this pt's typical sundowning is worse than usual. EXAM: PORTABLE CHEST 1 VIEW COMPARISON:  Chest radiograph 07/01/2018 FINDINGS: Stable cardiomediastinal contours. Aortic arch calcification. The lungs are clear. No pneumothorax or large pleural effusion. Diffuse osteopenia. Changes of prior vertebroplasty in the lower thoracic/upper lumbar spine. IMPRESSION: No evidence of active disease. Electronically Signed   By: Audie Pinto M.D.   On: 05/29/2019 13:35      IMPRESSION AND PLAN:   * Acute metabolic encephalopathy over dementia secondary to UTI Start IV ceftriaxone.  Urine culture sent and pending. Monitor for any inpatient delirium.  *Mild elevation in troponin.  No chest pain or shortness of breath.  EKG shows nothing acute.  Likely due to acute infection.  Will repeat troponin.  Telemetry monitoring.  Check echocardiogram.  *Hypertension.  Continue home medication of Coreg  *Hypothyroidism.  Check TSH levels.  Continue levothyroxine.  DVT prophylaxis with Lovenox  All the records are reviewed and case discussed with ED provider. Management plans discussed with the patient, family and they are in agreement.  CODE STATUS: DNR/DNI  TOTAL TIME TAKING CARE OF THIS PATIENT: 40 minutes.   Leia Alf Kallon Caylor M.D on 05/29/2019 at 3:36 PM  Between 7am to 6pm - Pager - [phone]  After 6pm go to [email or web address] - password [email or web address] [facility]  Office  [phone]  CC: Primary care physician; [name], MD  Note: This dictation was prepared with Dragon dictation along with smaller phrase technology. Any transcriptional errors that result from this process are unintentional.

## 2019-05-29 NOTE — ED Notes (Signed)
Patient transported to CT 

## 2019-05-29 NOTE — ED Notes (Addendum)
Date and time results received: 05/29/19 1356 (use smartphrase ".now" to insert current time)  Test: Troponin Critical Value: 129  Name of Provider Notified: Dr. Charna Archer  Orders Received? Or Actions Taken?:

## 2019-05-29 NOTE — ED Notes (Signed)
This RN discussed Covid19 swab with patient.  Patient refused.

## 2019-05-29 NOTE — Progress Notes (Signed)
Advance care planning  Purpose of Encounter UTI, Dementia  Parties in Attendance Patient , daughter [name]) over the phone due to COVID restriction  Patients Decisional capacity Patient with dementia unable to make medical decisions.  Discussed with daughter in detail regarding UTI, dementia, troponin elevation.  Treatment plan , prognosis discussed.  All questions answered  CODE STATUS discussed and daughter mentions that medical treatment would be appropriate but no CPR/defibrillation/ventilatory support.  Orders entered and CODE STATUS changed  DNR/DNI  Time spent - 17 minutes

## 2019-05-29 NOTE — ED Notes (Signed)
Patient hit call bell at this time and states she is feeling wet.  This RN checked patient's incontinence pad and checked the bed and fluids.  Nothing is wet at this time.  Patient states the blankets on the bed feel wet.  They are not wet.  Will continue to monitor.

## 2019-05-29 NOTE — ED Triage Notes (Signed)
Pt arrival via [facility] from home c/o worsening hallucinations which lasted through the night and into this morning. Pt family called EMS due to concern this pt's typical sundowning is worse than usual.  Pt has no complaints at this time. A&Ox4 at this point. Dr. Si Raider at bedside.

## 2019-05-29 NOTE — ED Provider Notes (Signed)
[facility] Emergency Department Provider Note   ____________________________________________   First MD Initiated Contact with Patient 05/29/19 1230     (approximate)  I have reviewed the triage vital signs and the nursing notes.   HISTORY  Chief Complaint Hallucinations    HPI Stephanie Harris is a 85 y.o. female with past medical history listed below who presents to the ED for altered mental status.  Per EMS, patient often deals with "sundowning" but does not have a history of dementia.  She reportedly was increasingly altered overnight last night with confusion and hallucinations.  This continued throughout the morning and when it did not improve, family contacted EMS.  Upon arrival to the ED, patient states that she feels well and denies any complaints.  She denies any fevers, chills, cough, chest pain, or shortness of breath.        Past Medical History:  Diagnosis Date  . Acute bronchiolitis   . Acute pharyngitis   . Allergic rhinitis due to pollen   . Anemia, unspecified    CKD and LGL  . Anxiety state    unspecified  . Arthritis    "knees, legs" (03/18/2018)  . Asthma without status asthmaticus    unspecified  . B12 deficiency   . Beta thalassemia trait   . Carotid artery occlusion   . Cellulitis and abscess of leg, except foot   . Cervical spondylosis   . Cervicalgia   . Chronic kidney disease   . Chronic lower back pain   . CML (chronic myelocytic leukemia) ([place])    On[facility] at [facility]  . Complication of anesthesia    "last back surgery they liked to never get me awake" (03/18/2018)  . Coronary artery disease 2009   heart attack with stent  . Coronary atherosclerosis of native coronary artery   . Depressive disorder    not elsewhere classified  . Difficulty in walking   . Disorder of breast, unspecified   . Dizziness and giddiness   . Dysuria   . Esophageal reflux   . Essential hypertension    unspecified  . Family history of  adverse reaction to anesthesia    "liked to never get my sister awake" (03/18/2018)  . Head injury    unspecified  . Headache    "q time I have a stroke" (03/18/2018)  . Heart disease   . Hematuria, unspecified   . History of blood transfusion    "had 22 when they found out I had leukemia" (03/18/2018)  . Hypersomnia, unspecified   . Hypertension   . Hypopotassemia   . Hypothyroidism   . ICH (intracerebral hemorrhage) ([place])   . Ill-defined cerebrovascular disease    other  . Insomnia    unspecified  . 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 Leukemia ([place])   . Lower urinary tract infection   . Lumbago   . Mini stroke ([place])    x years  . Mixed hyperlipidemia   . Myocardial infarction ([place]) ~2017  . Nontoxic nodular goiter    unspecified  . Occlusion and stenosis of unspecified carotid artery    without mention of cerebral infarction  . Osteoarthritis   . Osteoporosis   . Other constipation   . Other vitamin B12 deficiency anemias   . Otitis media, unspecified, unspecified ear   . Ovarian failure    unspecified  . Pain in limb   . Panic disorder without agoraphobia   . Peripheral vascular disease ([place])  unspecified  . Phlebitis of left arm   . RA (rheumatoid arthritis) ([place])   . Sigmoid polyp 1998  . Sleep disturbance    unspecified  . Stroke ([place]) 02/2017   "lots of mini strokes; big one 02/2017; that one made me weak in my knees,; never fully recovered" (03/18/2018)  . Syncope and collapse   . Thalassemia   . TIA (transient ischemic attack)    2000 and 2008 right carotid stent 08/14/2009 on Plavix  . TIA (transient ischemic attack) 03/18/2018  . Transient disorder of initiating or maintaining sleep   . Type II diabetes mellitus ([place])   . Varicose veins of bilateral lower extremities with other complications   . Varicose veins of bilateral lower extremities with other complications   . Vision abnormalities     Patient Active Problem List    Diagnosis Date Noted  . Troponin I above reference range 05/29/2019  . Weakness of both legs 03/12/2019  . Abnormality of gait 03/12/2019  . Peripheral edema 01/12/2019  . Skin lesion of right lower extremity 12/06/2018  . Diabetes mellitus without complication ([place]) 123XX123  . Urinary tract infection without hematuria 11/06/2018  . Bladder pain 11/06/2018  . Large granular lymphocytosis 10/05/2018  . Hypoglycemia 08/31/2018  . Nausea 08/31/2018  . B12 deficiency 08/31/2018  . Mini stroke ([place]) 08/30/2018  . Acquired hypothyroidism 07/11/2018  . Dyslipidemia 07/11/2018  . Chronic renal disease, stage III 07/11/2018  . Sacral fracture, closed ([place]) 07/11/2018  . Traumatic hematoma of buttock 07/11/2018  . Uses walker 07/07/2018  . Frequent falls 07/02/2018  . Sacral fracture, closed ([place]) 07/01/2018  . Traumatic hematoma of buttock 07/01/2018  . At risk for domestic violence 05/06/2018  . Pain in right foot 04/06/2018  . Cerebrovascular accident (CVA) ([place]) 04/05/2018  . Essential hypertension, benign 04/05/2018  . Carotid artery calcification, bilateral 04/05/2018  . Occlusion and stenosis of both vertebral arteries   . Stenosis of carotid artery   . Hypertensive emergency   . S/P kyphoplasty 12/16/2017  . Age-related osteoporosis with current pathological fracture 11/10/2017  . Protein-calorie malnutrition, severe 11/08/2017  . Lumbar compression fracture ([place]) 11/05/2017  . Spondylosis of lumbar region without myelopathy or radiculopathy 10/08/2017  . Chronic midline low back pain 08/29/2017  . Closed wedge compression fracture of first lumbar vertebra with delayed healing 08/29/2017  . Anemia in stage 3 chronic kidney disease 06/02/2017  . Left-sided nontraumatic intracerebral hemorrhage ([place]) 03/23/2017  . Primary osteoarthritis of left shoulder 12/31/2016  . Primary osteoarthritis of one knee, left 12/31/2016  . Closed fracture of proximal end of left humerus with  routine healing 08/28/2016  . Gross hematuria 06/25/2016  . Incomplete emptying of bladder 06/25/2016  . Moderate mitral insufficiency 10/31/2015  . Syncope and collapse 10/31/2015  . Continuous leakage of urine 08/30/2015  . Frequent UTI 08/30/2015  . Muscle weakness of lower extremity 03/20/2015  . Chest pain 03/14/2015  . TIA (transient ischemic attack) 01/29/2015  . Allergic arthritis, hand   . Adhesive capsulitis of left shoulder 12/13/2014  . SI joint arthritis 07/05/2014  . Mixed hyperlipidemia 05/22/2014  . Temporary cerebral vascular dysfunction 05/22/2014  . Irritable bowel syndrome with constipation 05/03/2014  . Dysphagia 05/03/2014  . Weight loss 05/03/2014  . Bilateral carotid artery disease ([place]) 04/18/2014  . Carotid atherosclerosis 04/18/2014  . Cerebral infarction, unspecified ([place]) 04/18/2014  . Cerebral artery occlusion with cerebral infarction ([place]) 01/11/2014  . Coronary atherosclerosis 01/11/2014  . Closed fracture of lumbar vertebra ([place])  12/19/2013  . Non-traumatic compression fracture of second lumbar vertebra ([place]) 12/19/2013  . Fall in home 05/19/2013  . Compression fracture of T12 vertebra ([place]) 04/27/2013  . Closed fracture of dorsal (thoracic) vertebra ([place]) 04/27/2013  . 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 Anemia due to stage 3 chronic kidney disease 06/15/2012  . Lymphoid leukemia ([place]) 06/15/2012  . Unspecified visual loss 05/20/2012  . Degenerative drusen 05/06/2012  . Drusen, retina 05/06/2012    Past Surgical History:  Procedure Laterality Date  . BACK SURGERY    . CAROTID ENDARTERECTOMY Left   . CAROTID STENT INSERTION Right    "have 2 stents in there" (03/18/2018)  . CATARACT EXTRACTION W/ INTRAOCULAR LENS  IMPLANT, BILATERAL Bilateral 06/16/2001 - 05/28/2003   +23.5D     22.5D  . CHOLECYSTECTOMY OPEN  1980  . COLONOSCOPY  2010  . CORONARY ANGIOPLASTY WITH STENT PLACEMENT    . EYELID SURGERY Bilateral  2005   BUL BLEPH  . FOOT SURGERY  05/13/2019  . FRACTURE SURGERY    . JOINT REPLACEMENT    . KYPHOPLASTY N/A 08/12/2017   Procedure: KYPHOPLASTY;  Surgeon: Hessie Knows, MD;  Location: [facility] ORS;  Service: Orthopedics;  Laterality: N/A;  . KYPHOPLASTY N/A 11/08/2017   Procedure: Hewitt Shorts;  Surgeon: Hessie Knows, MD;  Location: [facility] ORS;  Service: Orthopedics;  Laterality: N/A;  . PERCUTANEOUS PLACEMENT INTRAVASCULAR STENT CERVICAL CAROTID ARTERY  06/14/2009  . ROTATOR CUFF REPAIR Left   . TOTAL ABDOMINAL HYSTERECTOMY  1978   WITH REMOVAL TUBES & /OR OVARIES  . TOTAL KNEE ARTHROPLASTY Right     Prior to Admission medications   Medication Sig Start Date End Date Taking? Authorizing Provider  aspirin 81 MG chewable tablet Chew 81 mg by mouth daily.   Yes [provider]  carvedilol (COREG) 3.125 MG tablet Take 3.125 mg by mouth 2 (two) times daily.  03/22/19  Yes [provider]  levothyroxine (SYNTHROID) 50 MCG tablet Take 1 tablet (50 mcg total) by mouth daily. Name brand is medically necessary. 01/09/19  Yes Ronnell Freshwater, NP  mupirocin cream (BACTROBAN) 2 % Apply over any open areas of skin of bilateral feet twice a day for 7-10 days, or until healed 02/03/19 02/03/20 Yes Duffy Bruce, MD    Allergies Latex, Meperidine, Penicillins, Cefuroxime axetil, Codeine, and Propoxyphene  Family History  Problem Relation Age of Onset  . Acute myelogenous leukemia Brother   . Anemia Brother   . Coronary artery disease Brother   . Heart disease Brother   . Stroke Brother   . Heart failure Brother   . Stroke Mother   . Anemia Mother   . Heart disease Mother   . Heart failure Mother   . Hypertension Mother   . Osteoarthritis Mother   . Rheum arthritis Mother   . Heart attack Father   . Anemia Sister   . Cataracts Sister   . Osteoarthritis Sister   . Rheum arthritis Sister   . Stroke Sister   . Heart disease Sister   . Thalassemia Sister   . Thalassemia  Sister   . Blindness Neg Hx   . Glaucoma Neg Hx   . Macular degeneration Neg Hx   . Strabismus Neg Hx   . Vision loss Neg Hx   . Basal cell carcinoma Neg Hx   . GU problems Neg Hx   . Kidney cancer Neg Hx   . Melanoma Neg Hx   . Kidney disease Neg  Hx   . Prostate cancer Neg Hx   . Squamous cell carcinoma Neg Hx     Social History Social History   Tobacco Use  . Smoking status: Never Smoker  . Smokeless tobacco: Never Used  Substance Use Topics  . Alcohol use: No  . Drug use: No    Review of Systems  Constitutional: No fever/chills Eyes: No visual changes. ENT: No sore throat. Cardiovascular: Denies chest pain. Respiratory: Denies shortness of breath. Gastrointestinal: No abdominal pain.  No nausea, no vomiting.  No diarrhea.  No constipation. Genitourinary: Negative for dysuria. Musculoskeletal: Negative for back pain. Skin: Negative for rash. Neurological: Negative for headaches, focal weakness or numbness.  Positive for confusion.  ____________________________________________   PHYSICAL EXAM:  VITAL SIGNS: ED Triage Vitals  Enc Vitals Group     BP 05/29/19 1235 (!) 198/80     Pulse Rate 05/29/19 1235 84     Resp 05/29/19 1235 19     Temp 05/29/19 1235 (!) 97.5 F (36.4 C)     Temp src --      SpO2 05/29/19 1235 99 %     Weight 05/29/19 1236 86 lb (39 kg)     Height 05/29/19 1236 5' (1.524 m)     Head Circumference --      Peak Flow --      Pain Score 05/29/19 1235 0     Pain Loc --      Pain Edu? --      Excl. in [place]? --     Constitutional: Alert and oriented. Eyes: Conjunctivae are normal. Head: Atraumatic. Nose: No congestion/rhinnorhea. Mouth/Throat: Mucous membranes are moist. Neck: Normal ROM Cardiovascular: Normal rate, regular rhythm. Grossly normal heart sounds. Respiratory: Normal respiratory effort.  No retractions. Lungs CTAB. Gastrointestinal: Soft and nontender. No distention. Genitourinary: deferred Musculoskeletal: No lower  extremity tenderness nor edema. Neurologic:  Normal speech and language. No gross focal neurologic deficits are appreciated. Skin:  Skin is warm, dry and intact. No rash noted. Psychiatric: Mood and affect are normal. Speech and behavior are normal.  ____________________________________________   LABS (all labs ordered are listed, but only abnormal results are displayed)  Labs Reviewed  CBC WITH DIFFERENTIAL/PLATELET - Abnormal; Notable for the following components:      Result Value   RBC 5.55 (*)    Hemoglobin 10.2 (*)    HCT 34.3 (*)    MCV 61.8 (*)    MCH 18.4 (*)    MCHC 29.7 (*)    RDW 18.5 (*)    Platelets 110 (*)    All other components within normal limits  COMPREHENSIVE METABOLIC PANEL - Abnormal; Notable for the following components:   Glucose, Bld 110 (*)    BUN 32 (*)    Creatinine, Ser 1.18 (*)    Alkaline Phosphatase 25 (*)    Total Bilirubin 2.3 (*)    GFR calc non Af Amer 42 (*)    GFR calc Af Amer 48 (*)    All other components within normal limits  URINALYSIS, COMPLETE (UACMP) WITH MICROSCOPIC - Abnormal; Notable for the following components:   Color, Urine AMBER (*)    APPearance CLOUDY (*)    Hgb urine dipstick SMALL (*)    Ketones, ur 5 (*)    Protein, ur 100 (*)    WBC, UA >50 (*)    All other components within normal limits  TROPONIN I (HIGH SENSITIVITY) - Abnormal; Notable for the following components:   Troponin I (  High Sensitivity) 129 (*)    All other components within normal limits  URINE CULTURE  SARS CORONAVIRUS 2 (TAT 6-24 HRS)  HEMOGLOBIN A1C  TSH  TROPONIN I (HIGH SENSITIVITY)   ____________________________________________  EKG  ED ECG REPORT I, Blake Divine, the attending physician, personally viewed and interpreted this ECG.   Date: 05/29/2019  EKG Time: 12:35  Rate: 79  Rhythm: normal sinus rhythm  Axis: LAD  Intervals:none  ST&T Change: No ST/T changes, prolonged QT, occasional PVC   PROCEDURES  Procedure(s)  performed (including Critical Care):  Procedures   ____________________________________________   INITIAL IMPRESSION / ASSESSMENT AND PLAN / ED COURSE       85 year old female presents to the ED with episode of altered mental status overnight.  She is alert and oriented to person and time, but believes she is in another hospital in [place].  Neurologic exam is nonfocal.  Head CT negative for acute process and there is no evidence of infectious process.  Screening labs significant for elevated troponin, which cannot be explained by her kidney function.  Will admit patient for observation and trending of cardiac markers.      ____________________________________________   FINAL CLINICAL IMPRESSION(S) / ED DIAGNOSES  Final diagnoses:  Altered mental status, unspecified altered mental status type  Elevated troponin     ED Discharge Orders    None       Note:  This document was prepared using Dragon voice recognition software and may include unintentional dictation errors.   Blake Divine, MD 05/29/19 [phone]

## 2019-05-29 NOTE — ED Notes (Signed)
Pt is clean and dry when leaving the ER.  Pt alert.

## 2019-05-29 NOTE — ED Notes (Signed)
This RN spoke with patient's son.  Son states patient put styrofoam plates on the burners of the oven last night and turned the oven on.  Son states, "she can't come back here, I can't take care of her.  She was up all night last night talking to little people."  Son states he is disabled. Son states he thinks his oldest sister is patient's healthcare power of attorney, Stephanie Harris.  Per son, patient fell Thursday and patient has been, "sitting on the couch since Thursday since she can't walk".  "She's never been this confused."

## 2019-05-29 NOTE — ED Notes (Addendum)
Resumed care from [facility]. Pt alert   Iv fluids infusing.  Pt waiting on admission.  Sinus on monitor.

## 2019-05-30 ENCOUNTER — Inpatient Hospital Stay (HOSPITAL_COMMUNITY)
Admit: 2019-05-30 | Discharge: 2019-05-30 | Disposition: A | Discharge status: Home / Self Care | Payer: Medicare Other | Attending: Internal Medicine | Admitting: Internal Medicine

## 2019-05-30 ENCOUNTER — Observation Stay: Payer: Medicare Other

## 2019-05-30 ENCOUNTER — Other Ambulatory Visit: Payer: Self-pay | Admitting: *Deleted

## 2019-05-30 DIAGNOSIS — Z823 Family history of stroke: Secondary | ICD-10-CM | POA: Diagnosis not present

## 2019-05-30 DIAGNOSIS — G9341 Metabolic encephalopathy: Secondary | ICD-10-CM | POA: Diagnosis present

## 2019-05-30 DIAGNOSIS — F0390 Unspecified dementia without behavioral disturbance: Secondary | ICD-10-CM | POA: Diagnosis present

## 2019-05-30 DIAGNOSIS — I351 Nonrheumatic aortic (valve) insufficiency: Secondary | ICD-10-CM

## 2019-05-30 DIAGNOSIS — E039 Hypothyroidism, unspecified: Secondary | ICD-10-CM | POA: Diagnosis present

## 2019-05-30 DIAGNOSIS — Z955 Presence of coronary angioplasty implant and graft: Secondary | ICD-10-CM | POA: Diagnosis not present

## 2019-05-30 DIAGNOSIS — I2510 Atherosclerotic heart disease of native coronary artery without angina pectoris: Secondary | ICD-10-CM | POA: Diagnosis present

## 2019-05-30 DIAGNOSIS — E43 Unspecified severe protein-calorie malnutrition: Secondary | ICD-10-CM | POA: Diagnosis present

## 2019-05-30 DIAGNOSIS — Z20828 Contact with and (suspected) exposure to other viral communicable diseases: Secondary | ICD-10-CM | POA: Diagnosis present

## 2019-05-30 DIAGNOSIS — R7989 Other specified abnormal findings of blood chemistry: Secondary | ICD-10-CM | POA: Diagnosis present

## 2019-05-30 DIAGNOSIS — I340 Nonrheumatic mitral (valve) insufficiency: Secondary | ICD-10-CM | POA: Diagnosis not present

## 2019-05-30 DIAGNOSIS — R17 Unspecified jaundice: Secondary | ICD-10-CM | POA: Diagnosis present

## 2019-05-30 DIAGNOSIS — D696 Thrombocytopenia, unspecified: Secondary | ICD-10-CM | POA: Diagnosis present

## 2019-05-30 DIAGNOSIS — Z96651 Presence of right artificial knee joint: Secondary | ICD-10-CM | POA: Diagnosis present

## 2019-05-30 DIAGNOSIS — Z8672 Personal history of thrombophlebitis: Secondary | ICD-10-CM | POA: Diagnosis not present

## 2019-05-30 DIAGNOSIS — Z91040 Latex allergy status: Secondary | ICD-10-CM | POA: Diagnosis not present

## 2019-05-30 DIAGNOSIS — I252 Old myocardial infarction: Secondary | ICD-10-CM | POA: Diagnosis not present

## 2019-05-30 DIAGNOSIS — I129 Hypertensive chronic kidney disease with stage 1 through stage 4 chronic kidney disease, or unspecified chronic kidney disease: Secondary | ICD-10-CM | POA: Diagnosis present

## 2019-05-30 DIAGNOSIS — Z8673 Personal history of transient ischemic attack (TIA), and cerebral infarction without residual deficits: Secondary | ICD-10-CM | POA: Diagnosis not present

## 2019-05-30 DIAGNOSIS — Z681 Body mass index (BMI) 19 or less, adult: Secondary | ICD-10-CM | POA: Diagnosis not present

## 2019-05-30 DIAGNOSIS — N390 Urinary tract infection, site not specified: Secondary | ICD-10-CM | POA: Diagnosis present

## 2019-05-30 DIAGNOSIS — Z95828 Presence of other vascular implants and grafts: Secondary | ICD-10-CM | POA: Diagnosis not present

## 2019-05-30 DIAGNOSIS — N183 Chronic kidney disease, stage 3 unspecified: Secondary | ICD-10-CM | POA: Diagnosis present

## 2019-05-30 DIAGNOSIS — R778 Other specified abnormalities of plasma proteins: Secondary | ICD-10-CM | POA: Diagnosis present

## 2019-05-30 DIAGNOSIS — Z8249 Family history of ischemic heart disease and other diseases of the circulatory system: Secondary | ICD-10-CM | POA: Diagnosis not present

## 2019-05-30 DIAGNOSIS — Z7982 Long term (current) use of aspirin: Secondary | ICD-10-CM | POA: Diagnosis not present

## 2019-05-30 DIAGNOSIS — Z66 Do not resuscitate: Secondary | ICD-10-CM | POA: Diagnosis present

## 2019-05-30 LAB — BASIC METABOLIC PANEL
Anion gap: 9 (ref 5–15)
BUN: 30 mg/dL — ABNORMAL HIGH (ref 8–23)
CO2: 24 mmol/L (ref 22–32)
Calcium: 9.3 mg/dL (ref 8.9–10.3)
Chloride: 104 mmol/L (ref 98–111)
Creatinine, Ser: 1.04 mg/dL — ABNORMAL HIGH (ref 0.44–1.00)
GFR calc Af Amer: 56 mL/min — ABNORMAL LOW (ref >60)
GFR calc non Af Amer: 49 mL/min — ABNORMAL LOW (ref >60)
Glucose, Bld: 85 mg/dL (ref 70–99)
Potassium: 3.6 mmol/L (ref 3.5–5.1)
Sodium: 137 mmol/L (ref 135–145)

## 2019-05-30 LAB — ECHOCARDIOGRAM COMPLETE
Height: 60 in
Weight: 1446.22 oz

## 2019-05-30 LAB — GLUCOSE, CAPILLARY
Glucose-Capillary: 107 mg/dL — ABNORMAL HIGH (ref 70–99)
Glucose-Capillary: 446 mg/dL — ABNORMAL HIGH (ref 70–99)
Glucose-Capillary: 93 mg/dL (ref 70–99)
Glucose-Capillary: 141 mg/dL — ABNORMAL HIGH (ref 70–99)
Glucose-Capillary: 80 mg/dL (ref 70–99)

## 2019-05-30 LAB — CBC
HCT: 32.3 % — ABNORMAL LOW (ref 36.0–46.0)
Hemoglobin: 9.7 g/dL — ABNORMAL LOW (ref 12.0–15.0)
MCH: 18.4 pg — ABNORMAL LOW (ref 26.0–34.0)
MCHC: 30 g/dL (ref 30.0–36.0)
MCV: 61.2 fL — ABNORMAL LOW (ref 80.0–100.0)
Platelets: 100 10*3/uL — ABNORMAL LOW (ref 150–400)
RBC: 5.28 MIL/uL — ABNORMAL HIGH (ref 3.87–5.11)
RDW: 18 % — ABNORMAL HIGH (ref 11.5–15.5)
WBC: 6.6 10*3/uL (ref 4.0–10.5)
nRBC: 0 % (ref 0.0–0.2)

## 2019-05-30 LAB — SARS CORONAVIRUS 2 (TAT 6-24 HRS): SARS Coronavirus 2: NEGATIVE

## 2019-05-30 MED ORDER — BOOST / RESOURCE BREEZE PO LIQD CUSTOM: 1.0000 | Freq: Three times a day (TID) | ORAL | Status: DC

## 2019-05-30 MED ORDER — ADULT MULTIVITAMIN W/MINERALS CH
1.0000 | ORAL_TABLET | Freq: Every day | ORAL | Status: DC
  Administered 2019-05-31: 10:00:00 1 via ORAL
  Filled 2019-05-30: qty 1

## 2019-05-30 MED ORDER — CARVEDILOL 12.5 MG PO TABS
12.5000 mg | ORAL_TABLET | Freq: Two times a day (BID) | ORAL | Status: DC
  Administered 2019-05-30 (×2): 12.5 mg via ORAL
  Filled 2019-05-30 (×3): qty 1

## 2019-05-30 NOTE — TOC Initial Note (Signed)
Transition of Care [facility]) - Initial/Assessment Note    Patient Details  Name: Stephanie Harris MRN: [id number] Date of Birth: 05/13/1931  Transition of Care [facility]) CM/SW Contact:    Elza Rafter, RN Phone Number: 05/30/2019, 2:25 PM  Clinical Narrative:       Patient is from home with son.  Admitted with AMS/UTI.  Current with PCP; obtains medications at [facility] without difficulty.  She has a walker at home.  Has a private caregiver from 10-2 with [place].  She is also open to [facility] RN and PT.  PT has recommended SNF.  She is refusing.  Spoke with daughter Lorita Officer and Blane Ohara.  Sherren Mocha says he can take her back as long as her confusion has improved.  Her husband is at [employer].  Notified Helene Kelp with [facility].  Will add SW at DC.  Will continue to follow.               Expected Discharge Plan: [place] Barriers to Discharge: Continued Medical Work up   Patient Goals and CMS Choice Patient states their goals for this hospitalization and ongoing recovery are:: I want to go home CMS Medicare.gov Compare Post Acute Care list provided to:: Patient Choice offered to / list presented to : Patient, Adult Children  Expected Discharge Plan and Services Expected Discharge Plan: [place]   Discharge Planning Services: CM Consult Post Acute Care Choice: Home Health, Resumption of Svcs/PTA Provider Living arrangements for the past 2 months: Single Family Home                           HH Arranged: RN, PT, Social Work [place]: [place] (now [facility]) Date [place]: 05/30/19 Time [place]: 19 Representative spoke with at [place]: [place] Arrangements/Services Living arrangements for the past 2 months: [place] with:: Adult Children   Do you feel safe going back to the place where you live?: Yes            Criminal Activity/Legal  Involvement Pertinent to Current Situation/Hospitalization: No - Comment as needed  Activities of Daily Living Home Assistive Devices/Equipment: Environmental consultant (specify type), Bedside commode/3-in-1, Shower chair without back ADL Screening (condition at time of admission) Patient's cognitive ability adequate to safely complete daily activities?: Yes Is the patient deaf or have difficulty hearing?: No Does the patient have difficulty seeing, even when wearing glasses/contacts?: No Does the patient have difficulty concentrating, remembering, or making decisions?: No Patient able to express need for assistance with ADLs?: Yes Does the patient have difficulty dressing or bathing?: Yes Independently performs ADLs?: No Communication: Independent Dressing (OT): Needs assistance Is this a change from baseline?: Pre-admission baseline Grooming: Needs assistance Is this a change from baseline?: Pre-admission baseline Feeding: Independent Bathing: Needs assistance Is this a change from baseline?: Pre-admission baseline Toileting: Needs assistance Is this a change from baseline?: Pre-admission baseline In/Out Bed: Needs assistance Is this a change from baseline?: Pre-admission baseline Walks in Home: Needs assistance Is this a change from baseline?: Pre-admission baseline Does the patient have difficulty walking or climbing stairs?: Yes Weakness of Legs: Both Weakness of Arms/Hands: Both  Permission Sought/Granted Permission sought to share information with : Facility Art therapist granted to share information with : Yes, Verbal Permission Granted  Share Information with NAME: Helene Kelp  Emotional Assessment Appearance:: Appears stated age Attitude/Demeanor/Rapport: Self-Confident Affect (typically observed): Accepting Orientation: : Oriented to Self, Oriented to Place, Oriented to Situation Alcohol / Substance Use: Not Applicable    Admission diagnosis:  Elevated  troponin [R77.8] Altered mental status, unspecified altered mental status type [R41.82] Patient Active Problem List   Diagnosis Date Noted  . Acute metabolic encephalopathy [id number]  . Troponin I above reference range 05/29/2019  . Weakness of both legs 03/12/2019  . Abnormality of gait 03/12/2019  . Peripheral edema 01/12/2019  . Skin lesion of right lower extremity 12/06/2018  . Diabetes mellitus without complication ([place]) [id number]  . Urinary tract infection without hematuria 11/06/2018  . Bladder pain 11/06/2018  . Large granular lymphocytosis 10/05/2018  . Hypoglycemia 08/31/2018  . Nausea 08/31/2018  . B12 deficiency 08/31/2018  . Mini stroke ([place]) 08/30/2018  . Acquired hypothyroidism 07/11/2018  . Dyslipidemia 07/11/2018  . Chronic renal disease, stage III 07/11/2018  . Sacral fracture, closed ([place]) 07/11/2018  . Traumatic hematoma of buttock 07/11/2018  . Uses walker 07/07/2018  . Frequent falls 07/02/2018  . Sacral fracture, closed ([place]) 07/01/2018  . Traumatic hematoma of buttock 07/01/2018  . At risk for domestic violence 05/06/2018  . Pain in right foot 04/06/2018  . Cerebrovascular accident (CVA) ([place]) 04/05/2018  . Essential hypertension, benign 04/05/2018  . Carotid artery calcification, bilateral 04/05/2018  . Occlusion and stenosis of both vertebral arteries   . Stenosis of carotid artery   . Hypertensive emergency   . S/P kyphoplasty 12/16/2017  . Age-related osteoporosis with current pathological fracture 11/10/2017  . Protein-calorie malnutrition, severe 11/08/2017  . Lumbar compression fracture ([place]) 11/05/2017  . Spondylosis of lumbar region without myelopathy or radiculopathy 10/08/2017  . Chronic midline low back pain 08/29/2017  . Closed wedge compression fracture of first lumbar vertebra with delayed healing 08/29/2017  . Anemia in stage 3 chronic kidney disease 06/02/2017  . Left-sided nontraumatic intracerebral hemorrhage ([place]) 03/23/2017  .  Primary osteoarthritis of left shoulder 12/31/2016  . Primary osteoarthritis of one knee, left 12/31/2016  . Closed fracture of proximal end of left humerus with routine healing 08/28/2016  . Gross hematuria 06/25/2016  . Incomplete emptying of bladder 06/25/2016  . Moderate mitral insufficiency 10/31/2015  . Syncope and collapse 10/31/2015  . Continuous leakage of urine 08/30/2015  . Frequent UTI 08/30/2015  . Muscle weakness of lower extremity 03/20/2015  . Chest pain 03/14/2015  . TIA (transient ischemic attack) 01/29/2015  . Allergic arthritis, hand   . Adhesive capsulitis of left shoulder 12/13/2014  . SI joint arthritis 07/05/2014  . Mixed hyperlipidemia 05/22/2014  . Temporary cerebral vascular dysfunction 05/22/2014  . Irritable bowel syndrome with constipation 05/03/2014  . Dysphagia 05/03/2014  . Weight loss 05/03/2014  . Bilateral carotid artery disease ([place]) 04/18/2014  . Carotid atherosclerosis 04/18/2014  . Cerebral infarction, unspecified ([place]) 04/18/2014  . Cerebral artery occlusion with cerebral infarction ([place]) 01/11/2014  . Coronary atherosclerosis 01/11/2014  . Closed fracture of lumbar vertebra ([place]) 12/19/2013  . Non-traumatic compression fracture of second lumbar vertebra ([place]) 12/19/2013  . Fall in home 05/19/2013  . Compression fracture of T12 vertebra ([place]) 04/27/2013  . Closed fracture of dorsal (thoracic) vertebra ([place]) 04/27/2013  . 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 Anemia due to stage 3 chronic kidney disease 06/15/2012  . Lymphoid leukemia ([place]) 06/15/2012  . Unspecified visual loss 05/20/2012  . Degenerative drusen 05/06/2012  . Drusen, retina 05/06/2012   PCP:  [name],  MD Pharmacy:   [facility] [id number] [place], [place] [address] Phone: [phone] Fax: [phone]  [facility] [place] [place] [address] Phone: [phone] Fax: [phone]     Social Determinants of Health (SDOH) Interventions    Readmission Risk Interventions No flowsheet data found.

## 2019-05-30 NOTE — Progress Notes (Signed)
*  PRELIMINARY RESULTS* Echocardiogram 2D Echocardiogram has been performed.  Sherrie Sport 05/30/2019, 1:25 PM

## 2019-05-30 NOTE — Plan of Care (Signed)
  Problem: Education: Goal: Knowledge of General Education information will improve Description: Including pain rating scale, medication(s)/side effects and non-pharmacologic comfort measures Outcome: Progressing   Problem: Health Behavior/Discharge Planning: Goal: Ability to manage health-related needs will improve Outcome: Progressing   Problem: Clinical Measurements: Goal: Ability to maintain clinical measurements within normal limits will improve Outcome: Progressing Goal: Diagnostic test results will improve Outcome: Progressing Goal: Respiratory complications will improve Outcome: Progressing Goal: Cardiovascular complication will be avoided Outcome: Progressing   Problem: Activity: Goal: Risk for activity intolerance will decrease Outcome: Progressing   Problem: Nutrition: Goal: Adequate nutrition will be maintained Outcome: Progressing   Problem: Coping: Goal: Level of anxiety will decrease Outcome: Progressing   Problem: Elimination: Goal: Will not experience complications related to bowel motility Outcome: Progressing Goal: Will not experience complications related to urinary retention Outcome: Progressing   Problem: Safety: Goal: Ability to remain free from injury will improve Outcome: Progressing   Problem: Clinical Measurements: Goal: Will remain free from infection Outcome: Not Progressing Note: Remains on iv antibiotics, no fever

## 2019-05-30 NOTE — Patient Outreach (Signed)
[facility]) Care Management  05/30/2019  Stephanie Harris 16-May-1931 [id number]   RN Health attempted to call patient and noted patient had been admitted to hospital. Case closure due to patient will be followed by Complex Telephonic Care Management.  [facility] [phone]

## 2019-05-30 NOTE — Progress Notes (Signed)
Initial Nutrition Assessment  DOCUMENTATION CODES:   Severe malnutrition in context of chronic illness  INTERVENTION:   Boost Breeze po TID, each supplement provides 250 kcal and 9 grams of protein  Magic cup TID with meals, each supplement provides 290 kcal and 9 grams of protein  MVI daily   NUTRITION DIAGNOSIS:   Severe Malnutrition related to cancer and cancer related treatments(leukemia, advanced age) as evidenced by severe fat depletion, severe muscle depletion.  GOAL:   Patient will meet greater than or equal to 90% of their needs  MONITOR:   PO intake, Supplement acceptance, Labs, Weight trends, Skin, I & O's  REASON FOR ASSESSMENT:   Consult Assessment of nutrition requirement/status  ASSESSMENT:   85 y.o. female with a known history of leukemia, CKD III, compression fractures, hypertension, diabetes mellitus, dementia, B12 deficiency, hypothyroidism who lives at home with her disabled son presents to the emergency room due to worsening confusion. Pt found to have UTI  Pt familiar to nutrition department from previous admits. Pt with poor appetite and oral intake at baseline. Pt does not like milky Boost or Ensure supplements. Per chart, pt has lost 8lbs(8%) over the past 3 months but over the past 3-4 years, pt has lost ~50lbs. Pt eating 50-75% of meals in hospital. RD will add supplements to help pt meet her estimated needs.   Medications reviewed and include: aspirin, lovenox, insulin, synthroid, ceftriaxone   Labs reviewed: BUN 30(H), creat 1.04(H) Hgb 9.7(L), Hct 32.3(L), MCV 61.2(L), MCH 18.4(L) cbgs- 107, 446, 93 x 24 hrs  NUTRITION - FOCUSED PHYSICAL EXAM:    Most Recent Value  Orbital Region  Moderate depletion  Upper Arm Region  Moderate depletion  Thoracic and Lumbar Region  Severe depletion  Buccal Region  Severe depletion  Temple Region  Severe depletion  Clavicle Bone Region  Severe depletion  Clavicle and Acromion Bone Region  Severe  depletion  Scapular Bone Region  Severe depletion  Dorsal Hand  Severe depletion  Patellar Region  Severe depletion  Anterior Thigh Region  Severe depletion  Posterior Calf Region  Severe depletion  Edema (RD Assessment)  None  Hair  Reviewed  Eyes  Reviewed  Mouth  Reviewed  Skin  Reviewed  Nails  Reviewed     Diet Order:   Diet Order            Diet Carb Modified Fluid consistency: Thin; Room service appropriate? Yes  Diet effective now             EDUCATION NEEDS:   No education needs have been identified at this time  Skin:  Skin Assessment: Reviewed RN Assessment(ecchymosis)  Last BM:  pta  Height:   Ht Readings from Last 1 Encounters:  05/29/19 5' (1.524 m)    Weight:   Wt Readings from Last 1 Encounters:  05/30/19 41 kg    Ideal Body Weight:  45.4 kg  BMI:  Body mass index is 17.65 kg/m.  Estimated Nutritional Needs:   Kcal:  1100-1300kcal/day  Protein:  55-65g/day  Fluid:  >1L/day  Koleen Distance MS, RD, LDN Pager #- [phone] Office#- [phone] After Hours Pager: [phone]

## 2019-05-30 NOTE — Plan of Care (Signed)
  Problem: Coping: Goal: Level of anxiety will decrease Outcome: Progressing   Problem: Elimination: Goal: Will not experience complications related to urinary retention Outcome: Progressing   Problem: Pain Managment: Goal: General experience of comfort will improve Outcome: Progressing Note: No complaints of pain this shift   Problem: Safety: Goal: Ability to remain free from injury will improve Outcome: Progressing   Problem: Skin Integrity: Goal: Risk for impaired skin integrity will decrease Outcome: Progressing

## 2019-05-30 NOTE — Progress Notes (Signed)
[place] [facility] [place] NAME: Stephanie Harris    MR#:  [id number]  DATE OF BIRTH:  [birth year]  SUBJECTIVE:  CHIEF COMPLAINT:   Chief Complaint  Patient presents with  . Hallucinations   The patient has no complaints. REVIEW OF SYSTEMS:  Review of Systems  Constitutional: Negative for chills, fever and malaise/fatigue.  HENT: Negative for sore throat.   Eyes: Negative for blurred vision and double vision.  Respiratory: Negative for cough, hemoptysis, shortness of breath, wheezing and stridor.   Cardiovascular: Negative for chest pain, palpitations, orthopnea and leg swelling.  Gastrointestinal: Negative for abdominal pain, blood in stool, diarrhea, melena, nausea and vomiting.  Genitourinary: Negative for dysuria, flank pain and hematuria.  Musculoskeletal: Negative for back pain and joint pain.  Skin: Negative for rash.  Neurological: Negative for dizziness, sensory change, focal weakness, seizures, loss of consciousness, weakness and headaches.  Endo/Heme/Allergies: Negative for polydipsia.  Psychiatric/Behavioral: Negative for depression. The patient is not nervous/anxious.     DRUG ALLERGIES:   Allergies  Allergen Reactions  . Latex Rash  . Meperidine     Other reaction(s): Other (See Comments) Other Reaction: CNS Disorder  . Penicillins Other (See Comments)    Has patient had a PCN reaction causing immediate rash, facial/tongue/throat swelling, SOB or lightheadedness with hypotension: Unknown Has patient had a PCN reaction causing severe rash involving mucus membranes or skin necrosis: Unknown Has patient had a PCN reaction that required hospitalization: Unknown Has patient had a PCN reaction occurring within the last 10 years: Unknown If all of the above answers are "NO", then may proceed with Cephalosporin use.   . Cefuroxime Axetil Nausea And Vomiting  . Codeine Nausea And Vomiting and Nausea Only  . Propoxyphene Nausea  Only    Other reaction(s): Vomiting   VITALS:  Blood pressure (!) 180/68, pulse 67, temperature 98 F (36.7 C), temperature source Oral, resp. rate 19, height 5' (1.524 m), weight 41 kg, SpO2 98 %. PHYSICAL EXAMINATION:  Physical Exam Constitutional:      General: She is not in acute distress.    Comments: Malnutrition.  HENT:     Head: Normocephalic.  Eyes:     General: No scleral icterus.    Conjunctiva/sclera: Conjunctivae normal.     Pupils: Pupils are equal, round, and reactive to light.  Neck:     Musculoskeletal: Normal range of motion and neck supple.     Vascular: No JVD.     Trachea: No tracheal deviation.  Cardiovascular:     Rate and Rhythm: Normal rate and regular rhythm.     Heart sounds: Normal heart sounds. No murmur. No gallop.   Pulmonary:     Effort: Pulmonary effort is normal. No respiratory distress.     Breath sounds: Normal breath sounds. No wheezing or rales.  Abdominal:     General: Bowel sounds are normal. There is no distension.     Palpations: Abdomen is soft.     Tenderness: There is no abdominal tenderness. There is no rebound.  Musculoskeletal: Normal range of motion.        General: No tenderness.     Right lower leg: No edema.     Left lower leg: No edema.  Skin:    Findings: No erythema or rash.  Neurological:     Mental Status: She is alert.     Cranial Nerves: No cranial nerve deficit.     Comments: AAOx2    LABORATORY  PANEL:  Female CBC Recent Labs  Lab 05/30/19 0455  WBC 6.6  HGB 9.7*  HCT 32.3*  PLT 100*   ------------------------------------------------------------------------------------------------------------------ Chemistries  Recent Labs  Lab 05/29/19 1242 05/30/19 0455  NA 139 137  K 4.7 3.6  CL 106 104  CO2 25 24  GLUCOSE 110* 85  BUN 32* 30*  CREATININE 1.18* 1.04*  CALCIUM 9.5 9.3  AST 29  --   ALT 11  --   ALKPHOS 25*  --   BILITOT 2.3*  --    RADIOLOGY:  Ct Head Wo Contrast  Result Date:  05/29/2019 CLINICAL DATA:  Altered level of consciousness. EXAM: CT HEAD WITHOUT CONTRAST TECHNIQUE: Contiguous axial images were obtained from the base of the skull through the vertex without intravenous contrast. COMPARISON:  CT head without contrast 09/28/2018 FINDINGS: Brain: Advanced atrophy and white matter disease again noted. Remote anterior right frontal lobe infarct is stable. Remote lacunar infarcts are again noted within the cerebellum and right greater than left basal ganglia. No acute infarct, hemorrhage, or mass lesion is present. The ventricles are proportionate to the degree of atrophy. No significant extraaxial fluid collection is present. Vascular: Atherosclerotic calcifications are present within the cavernous internal carotid arteries. There is no hyperdense vessel. Skull: Hyperostosis frontalis internus is again noted. No focal lesions are present. No acute or healing fractures are present. Sinuses/Orbits: The paranasal sinuses and mastoid air cells are clear. Bilateral lens replacements are noted. Globes and orbits are otherwise unremarkable. IMPRESSION: 1. No acute intracranial abnormality. 2. Advanced atrophy and white matter disease is stable. 3. Stable remote infarcts involving the anterior right frontal lobe basal ganglia, and cerebellum. 4. Atherosclerosis. Electronically Signed   By: San Morelle M.D.   On: 05/29/2019 13:42   Dg Chest Portable 1 View  Result Date: 05/29/2019 CLINICAL DATA:  Pt arrival via [facility] from home c/o worsening hallucinations which lasted through the night and into this morning. Pt family called EMS due to concern this pt's typical sundowning is worse than usual. EXAM: PORTABLE CHEST 1 VIEW COMPARISON:  Chest radiograph 07/01/2018 FINDINGS: Stable cardiomediastinal contours. Aortic arch calcification. The lungs are clear. No pneumothorax or large pleural effusion. Diffuse osteopenia. Changes of prior vertebroplasty in the lower thoracic/upper lumbar  spine. IMPRESSION: No evidence of active disease. Electronically Signed   By: Audie Pinto M.D.   On: 05/29/2019 13:35   ASSESSMENT AND PLAN:   * Acute metabolic encephalopathy over dementia secondary to UTI Continue IV ceftriaxone.  Urine culture sent and pending. Aspiration and fall precaution.  *Mild elevation in troponin.  No chest pain or shortness of breath.  EKG shows nothing acute.  Telemetry monitoring.  Follow-up echocardiogram.  *Hypertension, accelerated,  increased Coreg, lopressor iv prn.   *Hypothyroidism.  Continue levothyroxine.  Elevated bilirubin.  Follow-up bilirubin level. Thrombocytopenia.  Looks like chronic.  Stable. Malnutrition.  Follow-up dietitian recommendation. Generalized weakness.  PT evaluation suggest skilled nursing facility placement. All the records are reviewed and case discussed with Care Management/Social Worker. Management plans discussed with the patient, family and they are in agreement.  CODE STATUS: DNR  TOTAL TIME TAKING CARE OF THIS PATIENT: 33 minutes.   More than 50% of the time was spent in counseling/coordination of care: YES  POSSIBLE D/C IN 2 DAYS, DEPENDING ON CLINICAL CONDITION.   Demetrios Loll M.D on 05/30/2019 at 12:07 PM  Between 7am to 6pm - Pager - [phone]  After 6pm go to [email or web address] - [facility]  [place] [facility]

## 2019-05-30 NOTE — Evaluation (Signed)
Physical Therapy Evaluation Patient Details Name: Stephanie Harris MRN: [id number] DOB: [birth year] Today's Date: 05/30/2019   History of Present Illness  85 yo female presented to ED for worsening confusion, found to have UTI and mildly elevated troponin. PMH includes: HTN, dementia, TIA, DM, hypothyroidism, b12 deficiency. family reports thet can no longer take care of her  Clinical Impression  Pt 85 yo female admitted for above. Pt in bed upon arrival and agreed to participate with PT. Pt alert and oriented to self, day of the week and place. Per chart review, pt poor historian and also provided different answers related to home set up and PLOF than previously documented. Pt presents with decreased strength (LE>UE), balance, activity tolerance, cognition and reported having multiple falls (~6) in the last year. Pt required min assist for bed mobility and min guard assist for transfers and ambulation. Pt able to perform supine and seated therex requiring frequent cuing for correct performance of exercises. Pt reported on room phone that she has recently lost 12 pounds in 3 weeks and is very weak. Per chart review, pts family is reporting that they can no longer provide the assistance she requires. Pt states that her husband, son and her aide can help her with what she needs help with. However her aide is only there a couple hours a day. Pt would benefit from skilled acute PT to improve deficits. Pt would benefit from skilled STR following hospital discharge in order to improve deficits, and independence and decrease caregiver burden.     Follow Up Recommendations SNF;Supervision for mobility/OOB    Equipment Recommendations  Rolling walker with 5" wheels    Recommendations for Other Services       Precautions / Restrictions Precautions Precautions: Fall Restrictions Weight Bearing Restrictions: No      Mobility  Bed Mobility Overal bed mobility: Needs Assistance Bed Mobility: Supine to  Sit     Supine to sit: Min assist;HOB elevated     General bed mobility comments: min assist for trunk elevation, pt utilized rails after VC, pt required increased time and effort with getting EOB  Transfers Overall transfer level: Needs assistance Equipment used: Rolling walker (2 wheeled) Transfers: Sit to/from Stand Sit to Stand: Min guard         General transfer comment: pt required vc for correct hand placement and not pulling up from walker, no physical assist needed to rise, pt steady with initial standing, pt very forward flexed at trunk during sit to standing and standing  Ambulation/Gait Ambulation/Gait assistance: Min guard Gait Distance (Feet): 50 Feet Assistive device: Rolling walker (2 wheeled) Gait Pattern/deviations: Step-through pattern;Trunk flexed;Decreased stride length Gait velocity: decreased   General Gait Details: pt ambulated with very slow gait speed, pt extremely forward flexed at trunk and requires frequent VC for standing up straight and looking up to see where she is going, pt had no instances of unsteadiness, buckling or overt LOB, pt did bump walker into corner of bed and scale in hallway but able to self correct without cuing or assistance, pt reported being a "whiz on the walker" during ambulation when asked if she was ambulating at baseline level however end of session reported that she was walking at about her normal  Stairs            Wheelchair Mobility    Modified Rankin (Stroke Patients Only)       Balance Overall balance assessment: History of Falls;Needs assistance Sitting-balance support: Feet supported;Single extremity supported Sitting  balance-Leahy Scale: Fair Sitting balance - Comments: pt steady sitting EOB, pt able to reach forward to high five therapist with small forward lean with trunk, pt able to withstand light perturbations from therapist with feet supported and light B UE support     Standing balance-Leahy  Scale: Poor Standing balance comment: reliant on RW for support                             Pertinent Vitals/Pain Pain Assessment: No/denies pain    Home Living Family/patient expects to be discharged to:: Private residence Living Arrangements: Spouse/significant other;Children Available Help at Discharge: Available 24 hours/day Type of Home: House Home Access: Stairs to enter Entrance Stairs-Rails: Right Entrance Stairs-Number of Steps: 4 Home Layout: One level Home Equipment: Walker - 2 wheels;Cane - single point      Prior Function Level of Independence: Needs assistance         Comments: pt has aide daily from 10-2 to help "with everything", pt states she ambulates with walker, pt reports taking care of finances independently and just needs someone to drive her. home set up and prior functioning that pt reports today is different from previous hospital encounters and reports to other healthcare providers, per chart review pt poor historian     Hand Dominance        Extremity/Trunk Assessment   Upper Extremity Assessment Upper Extremity Assessment: Generalized weakness;Difficult to assess due to impaired cognition    Lower Extremity Assessment Lower Extremity Assessment: Generalized weakness;Difficult to assess due to impaired cognition(LU weakness > UE)    Cervical / Trunk Assessment Cervical / Trunk Assessment: Kyphotic  Communication   Communication: HOH  Cognition Arousal/Alertness: Awake/alert Behavior During Therapy: WFL for tasks assessed/performed Overall Cognitive Status: History of cognitive impairments - at baseline                                 General Comments: pt oriented to self, and place, correct day of week incorrect month, pt able to follow one step commands consistently, pt reported while speaking on room phone with someone she knows that she has lost 12 pounds in the last 3 weeks and also reported that she had to  move rooms last night because the "room rains on you", based on this mornings session cognition appears to be the biggest limitation on her functional mobility       General Comments      Exercises Total Joint Exercises Ankle Circles/Pumps: AROM;Both;10 reps Straight Leg Raises: AROM;Both;10 reps Long Arc Quad: AROM;Both;10 reps   Assessment/Plan    PT Assessment Patient needs continued PT services  PT Problem List Decreased strength;Decreased mobility;Decreased safety awareness;Decreased range of motion;Decreased activity tolerance;Decreased cognition;Decreased balance;Decreased knowledge of use of DME       PT Treatment Interventions DME instruction;Therapeutic exercise;Gait training;Balance training;Neuromuscular re-education;Functional mobility training;Therapeutic activities;Patient/family education    PT Goals (Current goals can be found in the Care Plan section)  Acute Rehab PT Goals Patient Stated Goal: return home PT Goal Formulation: With patient Time For Goal Achievement: 06/13/19 Potential to Achieve Goals: Fair    Frequency Min 2X/week   Barriers to discharge Decreased caregiver support per chart review pts family reports they are not able to give her the assistance she needs    Co-evaluation               AM-PAC  PT "6 Clicks" Mobility  Outcome Measure Help needed turning from your back to your side while in a flat bed without using bedrails?: A Little Help needed moving from lying on your back to sitting on the side of a flat bed without using bedrails?: A Little Help needed moving to and from a bed to a chair (including a wheelchair)?: A Little Help needed standing up from a chair using your arms (e.g., wheelchair or bedside chair)?: A Little Help needed to walk in hospital room?: A Little Help needed climbing 3-5 steps with a railing? : A Lot 6 Click Score: 17    End of Session Equipment Utilized During Treatment: Gait belt Activity Tolerance:  Patient tolerated treatment well Patient left: in chair;with call bell/phone within reach;with chair alarm set;with nursing/sitter in room Nurse Communication: Mobility status PT Visit Diagnosis: Unsteadiness on feet (R26.81);Difficulty in walking, not elsewhere classified (R26.2);Other abnormalities of gait and mobility (R26.89);Muscle weakness (generalized) (M62.81)    Time: [id number] PT Time Calculation (min) (ACUTE ONLY): 33 min   Charges:   PT Evaluation $PT Eval Low Complexity: 1 Low PT Treatments $Therapeutic Exercise: 8-22 mins       Samaad Hashem PT, DPT 11:04 AM,05/30/19 [phone]

## 2019-05-31 ENCOUNTER — Other Ambulatory Visit: Payer: Self-pay

## 2019-05-31 DIAGNOSIS — I10 Essential (primary) hypertension: Secondary | ICD-10-CM

## 2019-05-31 LAB — BILIRUBIN, DIRECT: Bilirubin, Direct: 0.2 mg/dL (ref 0.0–0.2)

## 2019-05-31 LAB — BASIC METABOLIC PANEL
Anion gap: 8 (ref 5–15)
BUN: 31 mg/dL — ABNORMAL HIGH (ref 8–23)
CO2: 26 mmol/L (ref 22–32)
Calcium: 9.3 mg/dL (ref 8.9–10.3)
Chloride: 107 mmol/L (ref 98–111)
Creatinine, Ser: 1.11 mg/dL — ABNORMAL HIGH (ref 0.44–1.00)
GFR calc Af Amer: 52 mL/min — ABNORMAL LOW (ref >60)
GFR calc non Af Amer: 45 mL/min — ABNORMAL LOW (ref >60)
Glucose, Bld: 95 mg/dL (ref 70–99)
Potassium: 3.4 mmol/L — ABNORMAL LOW (ref 3.5–5.1)
Sodium: 141 mmol/L (ref 135–145)

## 2019-05-31 LAB — URINE CULTURE: Culture: >=100000 — AB

## 2019-05-31 LAB — GLUCOSE, CAPILLARY
Glucose-Capillary: 87 mg/dL (ref 70–99)
Glucose-Capillary: 112 mg/dL — ABNORMAL HIGH (ref 70–99)

## 2019-05-31 LAB — BILIRUBIN, TOTAL: Total Bilirubin: 1 mg/dL (ref 0.3–1.2)

## 2019-05-31 MED ORDER — CEFUROXIME AXETIL 250 MG PO TABS: 250.0000 mg | ORAL_TABLET | Freq: Two times a day (BID) | ORAL | 0 refills | Status: AC

## 2019-05-31 MED ORDER — POTASSIUM CHLORIDE 20 MEQ PO PACK
40.0000 meq | PACK | Freq: Once | ORAL | Status: DC
  Filled 2019-05-31: qty 2

## 2019-05-31 MED ORDER — CARVEDILOL 12.5 MG PO TABS: 12.5000 mg | ORAL_TABLET | Freq: Two times a day (BID) | ORAL | 0 refills | Status: AC

## 2019-05-31 MED ORDER — ADULT MULTIVITAMIN W/MINERALS CH: 1.0000 | ORAL_TABLET | Freq: Every day | ORAL | 0 refills | Status: AC

## 2019-05-31 NOTE — TOC Transition Note (Signed)
Transition of Care [facility]) - CM/SW Discharge Note   Patient Details  Name: Stephanie Harris MRN: [id number] Date of Birth: 22-Feb-1931  [facility] CM/SW Contact:  Elza Rafter, RN Phone Number: 05/31/2019, 2:39 PM   Clinical Narrative:   Discharging home today.  Helene Kelp with [facility] is aware of DC for home health services to start.     Final next level of care: [place] Barriers to Discharge: Continued Medical Work up   Patient Goals and CMS Choice Patient states their goals for this hospitalization and ongoing recovery are:: I want to go home CMS Medicare.gov Compare Post Acute Care list provided to:: Patient Choice offered to / list presented to : Patient, Adult Children  Discharge Placement                       Discharge Plan and Services   Discharge Planning Services: CM Consult Post Acute Care Choice: Home Health, Resumption of Svcs/PTA Provider                    HH Arranged: RN, PT, Social Work [employer] Agency: [facility] (now [facility]) Date [place]: 05/30/19 Time [place]: [id number] Representative spoke with at [place]: [place] ([place]) Interventions     Readmission Risk Interventions No flowsheet data found.

## 2019-05-31 NOTE — Discharge Summary (Signed)
[facility] NAME: Stephanie Harris    MR#:  [id number]  DATE OF BIRTH:  December 17, 1930  DATE OF ADMISSION:  05/29/2019 ADMITTING PHYSICIAN: Hillary Bow, MD  DATE OF DISCHARGE: 05/31/2019   PRIMARY CARE PHYSICIAN: [name], MD    ADMISSION DIAGNOSIS:  Elevated troponin [R77.8] Altered mental status, unspecified altered mental status type [R41.82]  DISCHARGE DIAGNOSIS:  Active Problems:   Troponin I above reference range   Acute metabolic encephalopathy   SECONDARY DIAGNOSIS:   Past Medical History:  Diagnosis Date  . Acute bronchiolitis   . Acute pharyngitis   . Allergic rhinitis due to pollen   . Anemia, unspecified    CKD and LGL  . Anxiety state    unspecified  . Arthritis    "knees, legs" (03/18/2018)  . Asthma without status asthmaticus    unspecified  . B12 deficiency   . Beta thalassemia trait   . Carotid artery occlusion   . Cellulitis and abscess of leg, except foot   . Cervical spondylosis   . Cervicalgia   . Chronic kidney disease   . Chronic lower back pain   . CML (chronic myelocytic leukemia) ([place])    Onc at [facility]  . Complication of anesthesia    "last back surgery they liked to never get me awake" (03/18/2018)  . Coronary artery disease 2009   heart attack with stent  . Coronary atherosclerosis of native coronary artery   . Depressive disorder    not elsewhere classified  . Difficulty in walking   . Disorder of breast, unspecified   . Dizziness and giddiness   . Dysuria   . Esophageal reflux   . Essential hypertension    unspecified  . Family history of adverse reaction to anesthesia    "liked to never get my sister awake" (03/18/2018)  . Head injury    unspecified  . Headache    "q time I have a stroke" (03/18/2018)  . Heart disease   . Hematuria, unspecified   . History of blood transfusion    "had 22 when they found out I had leukemia" (03/18/2018)  . Hypersomnia, unspecified    . Hypertension   . Hypopotassemia   . Hypothyroidism   . ICH (intracerebral hemorrhage) ([place])   . Ill-defined cerebrovascular disease    other  . Insomnia    unspecified  . 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 Leukemia ([place])   . Lower urinary tract infection   . Lumbago   . Mini stroke ([place])    x years  . Mixed hyperlipidemia   . Myocardial infarction ([place]) ~2017  . Nontoxic nodular goiter    unspecified  . Occlusion and stenosis of unspecified carotid artery    without mention of cerebral infarction  . Osteoarthritis   . Osteoporosis   . Other constipation   . Other vitamin B12 deficiency anemias   . Otitis media, unspecified, unspecified ear   . Ovarian failure    unspecified  . Pain in limb   . Panic disorder without agoraphobia   . Peripheral vascular disease ([place])    unspecified  . Phlebitis of left arm   . RA (rheumatoid arthritis) ([place])   . Sigmoid polyp 1998  . Sleep disturbance    unspecified  . Stroke ([place]) 02/2017   "lots of mini strokes; big one 02/2017; that one made me weak in my knees,; never fully recovered" (03/18/2018)  .  Syncope and collapse   . Thalassemia   . TIA (transient ischemic attack)    2000 and 2008 right carotid stent 08/14/2009 on Plavix  . TIA (transient ischemic attack) 03/18/2018  . Transient disorder of initiating or maintaining sleep   . Type II diabetes mellitus ([place])   . Varicose veins of bilateral lower extremities with other complications   . Varicose veins of bilateral lower extremities with other complications   . Vision abnormalities     HOSPITAL COURSE:   *Acute metabolic encephalopathyover dementia secondary to UTI Continue IV ceftriaxone. Urine culture sent and pending- have gram negative rods. Aspiration and fall precaution. PT suggested SNF placement but patient refused for that.  We will arrange for home health and RN.  *Mild elevation in troponin. No chest pain or shortness of breath. EKG  shows nothing acute. Telemetry monitoring.   *Hypertension, accelerated, increased Coreg, lopressor iv prn.   *Hypothyroidism. Continue levothyroxine.  Elevated bilirubin.  Follow-up bilirubin level. Came down. Thrombocytopenia.  Looks like chronic.  Stable. Malnutrition.  Follow-up dietitian recommendation. Generalized weakness.  PT evaluation suggest skilled nursing facility placement.  Patient refusing for that.  DISCHARGE CONDITIONS:   Stable  CONSULTS OBTAINED:    DRUG ALLERGIES:   Allergies  Allergen Reactions  . Latex Rash  . Meperidine     Other reaction(s): Other (See Comments) Other Reaction: CNS Disorder  . Penicillins Other (See Comments)    Has patient had a PCN reaction causing immediate rash, facial/tongue/throat swelling, SOB or lightheadedness with hypotension: Unknown Has patient had a PCN reaction causing severe rash involving mucus membranes or skin necrosis: Unknown Has patient had a PCN reaction that required hospitalization: Unknown Has patient had a PCN reaction occurring within the last 10 years: Unknown If all of the above answers are "NO", then may proceed with Cephalosporin use.   . Cefuroxime Axetil Nausea And Vomiting  . Codeine Nausea And Vomiting and Nausea Only  . Propoxyphene Nausea Only    Other reaction(s): Vomiting    DISCHARGE MEDICATIONS:   Allergies as of 05/31/2019      Reactions   Latex Rash   Meperidine    Other reaction(s): Other (See Comments) Other Reaction: CNS Disorder   Penicillins Other (See Comments)   Has patient had a PCN reaction causing immediate rash, facial/tongue/throat swelling, SOB or lightheadedness with hypotension: Unknown Has patient had a PCN reaction causing severe rash involving mucus membranes or skin necrosis: Unknown Has patient had a PCN reaction that required hospitalization: Unknown Has patient had a PCN reaction occurring within the last 10 years: Unknown If all of the above answers are  "NO", then may proceed with Cephalosporin use.   Cefuroxime Axetil Nausea And Vomiting   Codeine Nausea And Vomiting, Nausea Only   Propoxyphene Nausea Only   Other reaction(s): Vomiting      Medication List    STOP taking these medications   mupirocin cream 2 % Commonly known as: Bactroban     TAKE these medications   aspirin 81 MG chewable tablet Chew 81 mg by mouth daily.   carvedilol 12.5 MG tablet Commonly known as: COREG Take 1 tablet (12.5 mg total) by mouth 2 (two) times daily with a meal. What changed:   medication strength  how much to take  when to take this   cefUROXime 250 MG tablet Commonly known as: Ceftin Take 1 tablet (250 mg total) by mouth 2 (two) times daily for 3 days.   levothyroxine 50 MCG tablet  Commonly known as: SYNTHROID Take 1 tablet (50 mcg total) by mouth daily. Name brand is medically necessary.   multivitamin with minerals Tabs tablet Take 1 tablet by mouth daily. Start taking on: June 01, 2019        DISCHARGE INSTRUCTIONS:    Follow with primary care physician in 1 week.  If you experience worsening of your admission symptoms, develop shortness of breath, life threatening emergency, suicidal or homicidal thoughts you must seek medical attention immediately by calling 911 or calling your MD immediately  if symptoms less severe.  You Must read complete instructions/literature along with all the possible adverse reactions/side effects for all the Medicines you take and that have been prescribed to you. Take any new Medicines after you have completely understood and accept all the possible adverse reactions/side effects.   Please note  You were cared for by a hospitalist during your hospital stay. If you have any questions about your discharge medications or the care you received while you were in the hospital after you are discharged, you can call the unit and asked to speak with the hospitalist on call if the hospitalist that  took care of you is not available. Once you are discharged, your primary care physician will handle any further medical issues. Please note that NO REFILLS for any discharge medications will be authorized once you are discharged, as it is imperative that you return to your primary care physician (or establish a relationship with a primary care physician if you do not have one) for your aftercare needs so that they can reassess your need for medications and monitor your lab values.    Today   CHIEF COMPLAINT:   Chief Complaint  Patient presents with  . Hallucinations    HISTORY OF PRESENT ILLNESS:  Stephanie Harris  is a 85 y.o. female with a known history of hypertension, diabetes mellitus, dementia, B12 deficiency, hypothyroidism who lives at home with her disabled son presents to the emergency room due to worsening confusion.  Patient is a poor historian.  She does not have any other concerns.  She was found to have a UTI in the emergency room along with mildly elevated troponin.  Family is unable to care for her at home and request that she be placed in a facility. She had put some Styrofoam plates on the stove and in the oven yesterday.   VITAL SIGNS:  Blood pressure (!) 155/56, pulse (!) 58, temperature (!) 97.5 F (36.4 C), temperature source Oral, resp. rate 18, height 5' (1.524 m), weight 38 kg, SpO2 99 %.  I/O:    Intake/Output Summary (Last 24 hours) at 05/31/2019 1340 Last data filed at 05/31/2019 0743 Gross per 24 hour  Intake 240 ml  Output 0 ml  Net 240 ml    PHYSICAL EXAMINATION:  GENERAL:  85 y.o.-year-old patient lying in the bed with no acute distress.  EYES: Pupils equal, round, reactive to light and accommodation. No scleral icterus. Extraocular muscles intact.  HEENT: Head atraumatic, normocephalic. Oropharynx and nasopharynx clear.  NECK:  Supple, no jugular venous distention. No thyroid enlargement, no tenderness.  LUNGS: Normal breath sounds bilaterally, no  wheezing, rales,rhonchi or crepitation. No use of accessory muscles of respiration.  CARDIOVASCULAR: S1, S2 normal. No murmurs, rubs, or gallops.  ABDOMEN: Soft, non-tender, non-distended. Bowel sounds present. No organomegaly or mass.  EXTREMITIES: No pedal edema, cyanosis, or clubbing.  NEUROLOGIC: Cranial nerves II through XII are intact. Muscle strength 3-4/5 in all extremities. Sensation  intact. Gait not checked.  PSYCHIATRIC: The patient is alert and oriented x 3.  SKIN: No obvious rash, lesion, or ulcer.   DATA REVIEW:   CBC Recent Labs  Lab 05/30/19 0455  WBC 6.6  HGB 9.7*  HCT 32.3*  PLT 100*    Chemistries  Recent Labs  Lab 05/29/19 1242  05/31/19 0547  NA 139   < > 141  K 4.7   < > 3.4*  CL 106   < > 107  CO2 25   < > 26  GLUCOSE 110*   < > 95  BUN 32*   < > 31*  CREATININE 1.18*   < > 1.11*  CALCIUM 9.5   < > 9.3  AST 29  --   --   ALT 11  --   --   ALKPHOS 25*  --   --   BILITOT 2.3*  --  1.0   < > = values in this interval not displayed.    Cardiac Enzymes No results for input(s): TROPONINI in the last 168 hours.  Microbiology Results  Results for orders placed or performed during the hospital encounter of 05/29/19  Urine culture     Status: Abnormal (Preliminary result)   Collection Time: 05/29/19 12:42 PM   Specimen: Urine, Clean Catch  Result Value Ref Range Status   Specimen Description   Final    URINE, CLEAN CATCH Performed at [facility], [address] [place], [place] [address]    Special Requests   Final    NONE Performed at [facility], [address] [place], [place] [address]    Culture (A)  Final    >=100,000 COLONIES/mL GRAM NEGATIVE RODS IDENTIFICATION AND SUSCEPTIBILITIES TO FOLLOW Performed at [facility], [place] [address], [place], [place] [address]    Report Status PENDING  Incomplete  SARS CORONAVIRUS 2 (TAT 6-24 HRS) Nasopharyngeal Nasopharyngeal Swab     Status: None   Collection Time:  05/30/19  8:25 AM   Specimen: Nasopharyngeal Swab  Result Value Ref Range Status   SARS Coronavirus 2 NEGATIVE NEGATIVE Final    Comment: (NOTE) SARS-CoV-2 target nucleic acids are NOT DETECTED. The SARS-CoV-2 RNA is generally detectable in upper and lower respiratory specimens during the acute phase of infection. Negative results do not preclude SARS-CoV-2 infection, do not rule out co-infections with other pathogens, and should not be used as the sole basis for treatment or other patient management decisions. Negative results must be combined with clinical observations, patient history, and epidemiological information. The expected result is Negative. Fact Sheet for Patients: SugarRoll.be Fact Sheet for Healthcare Providers: https://www.woods-mathews.com/ This test is not yet approved or cleared by the [place] FDA and  has been authorized for detection and/or diagnosis of SARS-CoV-2 by FDA under an Emergency Use Authorization (EUA). This EUA will remain  in effect (meaning this test can be used) for the duration of the COVID-19 declaration under Section 56 4(b)(1) of the Act, 21 U.S.C. section 360bbb-3(b)(1), unless the authorization is terminated or revoked sooner. Performed at [facility], [place] [address] [place],  [address]     RADIOLOGY:  No results found.  EKG:   Orders placed or performed during the hospital encounter of 05/29/19  . EKG 12-Lead  . EKG 12-Lead      Management plans discussed with the patient, family and they are in agreement.  CODE STATUS:     Code Status Orders  (From admission,  onward)         Start     Ordered   05/29/19 1537  Do not attempt resuscitation (DNR)  Continuous    Question Answer Comment  In the event of cardiac or respiratory ARREST Do not call a "code blue"   In the event of cardiac or respiratory ARREST Do not perform Intubation, CPR, defibrillation or ACLS    In the event of cardiac or respiratory ARREST Use medication by any route, position, wound care, and other measures to relive pain and suffering. May use oxygen, suction and manual treatment of airway obstruction as needed for comfort.      05/29/19 1536        Code Status History    Date Active Date Inactive Code Status Order ID Comments User Context   05/29/2019 1421 05/29/2019 1536 Full Code [id number]  Hillary Bow, MD ED   09/28/2018 1715 10/04/2018 1953 Full Code [id number]  [name], MD Inpatient   03/18/2018 1739 03/22/2018 1539 Full Code [id number]  Steve Rattler, DO Inpatient   11/06/2017 0101 11/09/2017 1704 Full Code [id number]  Amelia Jo, MD Inpatient   08/11/2017 0733 08/13/2017 2053 Full Code [id number]  Hillary Bow, MD ED   04/28/2016 1820 04/30/2016 1721 Full Code [id number]  [name], MD Inpatient   03/14/2015 1255 03/15/2015 1808 Full Code [id number]  [name], MD Inpatient   01/29/2015 0527 01/30/2015 1724 Full Code [id number]  Harrie Foreman, MD Inpatient   Advance Care Planning Activity    Advance Directive Documentation     Most Recent Value  Type of Advance Directive  Living will  Pre-existing out of facility DNR order (yellow form or pink MOST form)  -  "MOST" Form in Place?  -      TOTAL TIME TAKING CARE OF THIS PATIENT: 35 minutes.    Vaughan Basta M.D on 05/31/2019 at 1:40 PM  Between 7am to 6pm - Pager - [phone]  After 6pm go to [email or web address] - password [email or web address] [facility]  Office  [phone]  CC: Primary care physician; [name], MD   Note: This dictation was prepared with Dragon dictation along with smaller phrase technology. Any transcriptional errors that result from this process are unintentional.

## 2019-06-02 ENCOUNTER — Other Ambulatory Visit: Payer: Self-pay | Admitting: *Deleted

## 2019-06-02 ENCOUNTER — Encounter: Payer: Self-pay | Admitting: *Deleted

## 2019-06-02 NOTE — Patient Outreach (Signed)
[facility]) Care Management [place] Telephone Outreach PCP completes Transition of Care follow up post-hospital discharge Post-hospital discharge day # 2  06/02/2019  Stephanie Harris 10-03-1930 [id number]  Successful telephone outreach to Stephanie Harris, 85 y/o female referred to [facility] by [facility] RN CM hospital liaison 06/01/2019 after recent hospitalization September 5-7, 2020 for AMS; acute metabolic encephalopathy due to UTI; patient was discharged home to self-care with home health services in place after she refused short-term SNF placement for rehabilitation, which was recommended at time of patient's hospital discharge.  Patient has history including, but not limited to, HTN/ HLD; previous CVA/ TIA; CKD- III with anemia; frequent falls with injury; protein calorie malnutrition.    HIPAA/ identity verified with patient during brief telephone call today; attempted to discuss/ explain [facility] CM services and patient immediately states that she "knows all about" [facility] CM services and she "still does not want" [facility] CM services, stating she has been contacted in the past by [facility] CM team and 'said no then."  Patient states that she has a private duty caregiver present in her home and caregiver has been "tending to" her care needs for several years.  Patient states that she has all of her medications and is taking all as prescribed; denies concerns around medications.  Reports private duty caregiver provides transportation to all provider appointments/ and she denies transportation issues.  Within minutes of the start of our conversation today, patient stated, "I just told you I do not want or need any more services or help than I already have;" I inquired with patient if I could contact her next week to follow up, and she states, "No, do not call me. I have told you I have everything I need, and I do not want any more calls."  Patient then abruptly disconnected call/ hung phone  up.  Plan:  Will mail patient unsuccessful outreach letter, in attempt to engage patient by mail  Will close [place] case, as patient has adamantly declined participation in [place] services today and requested no further telephone outreach  Oneta Rack, RN, BSN, [facility]  [phone]

## 2019-06-06 NOTE — Progress Notes (Signed)
I think she has seen you the last few times.

## 2019-06-09 ENCOUNTER — Telehealth: Payer: Self-pay

## 2019-06-09 NOTE — Telephone Encounter (Signed)
Pt home health physical therapist called to get verbal orders. Physical therapy for twice a week for 3 weeks and once a week for 2 weeks. Gave physical therapist the verbal ok per Nimisha.

## 2019-06-13 ENCOUNTER — Ambulatory Visit: Payer: Medicare Other | Admitting: Adult Health

## 2019-06-15 ENCOUNTER — Ambulatory Visit: Payer: Medicare Other | Admitting: Nurse Practitioner

## 2019-06-20 ENCOUNTER — Telehealth: Payer: Self-pay

## 2019-06-20 NOTE — Telephone Encounter (Signed)
PLAN OF CARE SIGNED AND PLACED IN [facility] FOLDER °

## 2019-06-28 ENCOUNTER — Other Ambulatory Visit: Payer: Self-pay

## 2019-06-28 DIAGNOSIS — R110 Nausea: Secondary | ICD-10-CM

## 2019-06-28 MED ORDER — ONDANSETRON 4 MG PO TBDP: 4.0000 mg | ORAL_TABLET | Freq: Three times a day (TID) | ORAL | 0 refills | Status: AC | PRN

## 2019-07-17 ENCOUNTER — Telehealth: Payer: Self-pay

## 2019-07-17 NOTE — Telephone Encounter (Signed)
CONFIRMED AND SCREENED PATIENT FOR 07-19-19 OV.

## 2019-07-19 ENCOUNTER — Ambulatory Visit (INDEPENDENT_AMBULATORY_CARE_PROVIDER_SITE_OTHER): Payer: Medicare Other | Admitting: Nurse Practitioner

## 2019-07-19 ENCOUNTER — Other Ambulatory Visit: Payer: Self-pay

## 2019-07-19 ENCOUNTER — Encounter: Payer: Self-pay | Admitting: Nurse Practitioner

## 2019-07-19 VITALS — BP 161/82 | HR 80 | Temp 97.4°F | Resp 16 | Ht 60.0 in | Wt 89.0 lb

## 2019-07-19 DIAGNOSIS — R29898 Other symptoms and signs involving the musculoskeletal system: Secondary | ICD-10-CM | POA: Diagnosis not present

## 2019-07-19 DIAGNOSIS — K219 Gastro-esophageal reflux disease without esophagitis: Secondary | ICD-10-CM

## 2019-07-19 DIAGNOSIS — E039 Hypothyroidism, unspecified: Secondary | ICD-10-CM

## 2019-07-19 DIAGNOSIS — I10 Essential (primary) hypertension: Secondary | ICD-10-CM

## 2019-07-19 MED ORDER — OMEPRAZOLE 20 MG PO CPDR: 20.0000 mg | DELAYED_RELEASE_CAPSULE | Freq: Every day | ORAL | 2 refills | Status: AC

## 2019-07-19 NOTE — Progress Notes (Signed)
[facility] [place], [place] [address]  Internal MEDICINE  Office Visit Note  Patient Name: Stephanie Harris  [id number]  [id number]  Date of Service: 07/30/2019  Chief Complaint  Patient presents with  . Annual Exam  . Hypertension  . Hypothyroidism  . Difficulty Walking    hard for her to walk long distances due to fatigue   . Fatigue  . Fall    frequent falls    Ms. Zuccarelli presents for an annual wellness exam. She reports that she has significant trouble walking due to pain and weakness in her legs. She has had multiple falls, the most recent one in September, but states that she has never hit her head or experienced syncope. She also reports that she has been waking up and vomiting almost every night. She states that the vomitus is liquid and bile rather than undigested food.        Current Medication: Outpatient Encounter Medications as of 07/19/2019  Medication Sig  . aspirin 81 MG chewable tablet Chew 81 mg by mouth daily.  . carvedilol (COREG) 12.5 MG tablet Take 1 tablet (12.5 mg total) by mouth 2 (two) times daily with a meal.  . Multiple Vitamin (MULTIVITAMIN WITH MINERALS) TABS tablet Take 1 tablet by mouth daily.  . ondansetron (ZOFRAN ODT) 4 MG disintegrating tablet Take 1 tablet (4 mg total) by mouth every 8 (eight) hours as needed for nausea or vomiting.  . [DISCONTINUED] levothyroxine (SYNTHROID) 50 MCG tablet Take 1 tablet (50 mcg total) by mouth daily. Name brand is medically necessary.  Marland Kitchen omeprazole (PRILOSEC) 20 MG capsule Take 1 capsule (20 mg total) by mouth daily.   No facility-administered encounter medications on file as of 07/19/2019.     Surgical History: Past Surgical History:  Procedure Laterality Date  . BACK SURGERY    . CAROTID ENDARTERECTOMY Left   . CAROTID STENT INSERTION Right    "have 2 stents in there" (03/18/2018)  . CATARACT EXTRACTION W/ INTRAOCULAR LENS  IMPLANT, BILATERAL Bilateral 06/16/2001 -  05/28/2003   +23.5D     22.5D  . CHOLECYSTECTOMY OPEN  1980  . COLONOSCOPY  2010  . CORONARY ANGIOPLASTY WITH STENT PLACEMENT    . EYELID SURGERY Bilateral 2005   BUL BLEPH  . FOOT SURGERY  05/13/2019  . FRACTURE SURGERY    . JOINT REPLACEMENT    . KYPHOPLASTY N/A 08/12/2017   Procedure: KYPHOPLASTY;  Surgeon: Hessie Knows, MD;  Location: [facility] ORS;  Service: Orthopedics;  Laterality: N/A;  . KYPHOPLASTY N/A 11/08/2017   Procedure: Hewitt Shorts;  Surgeon: Hessie Knows, MD;  Location: [facility] ORS;  Service: Orthopedics;  Laterality: N/A;  . PERCUTANEOUS PLACEMENT INTRAVASCULAR STENT CERVICAL CAROTID ARTERY  06/14/2009  . ROTATOR CUFF REPAIR Left   . TOTAL ABDOMINAL HYSTERECTOMY  1978   WITH REMOVAL TUBES & /OR OVARIES  . TOTAL KNEE ARTHROPLASTY Right     Medical History: Past Medical History:  Diagnosis Date  . Acute bronchiolitis   . Acute pharyngitis   . Allergic rhinitis due to pollen   . Anemia, unspecified    CKD and LGL  . Anxiety state    unspecified  . Arthritis    "knees, legs" (03/18/2018)  . Asthma without status asthmaticus    unspecified  . B12 deficiency   . Beta thalassemia trait   . Carotid artery occlusion   . Cellulitis and abscess of leg, except foot   . Cervical spondylosis   . Cervicalgia   .  Chronic kidney disease   . Chronic lower back pain   . CML (chronic myelocytic leukemia) ([place])    Onc at [facility]  . Complication of anesthesia    "last back surgery they liked to never get me awake" (03/18/2018)  . Coronary artery disease 2009   heart attack with stent  . Coronary atherosclerosis of native coronary artery   . Depressive disorder    not elsewhere classified  . Difficulty in walking   . Disorder of breast, unspecified   . Dizziness and giddiness   . Dysuria   . Esophageal reflux   . Essential hypertension    unspecified  . Family history of adverse reaction to anesthesia    "liked to never get my sister awake" (03/18/2018)  . Head injury     unspecified  . Headache    "q time I have a stroke" (03/18/2018)  . Heart disease   . Hematuria, unspecified   . History of blood transfusion    "had 22 when they found out I had leukemia" (03/18/2018)  . Hypersomnia, unspecified   . Hypertension   . Hypopotassemia   . Hypothyroidism   . ICH (intracerebral hemorrhage) ([place])   . Ill-defined cerebrovascular disease    other  . Insomnia    unspecified  . 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 Leukemia ([place])   . Lower urinary tract infection   . Lumbago   . Mini stroke ([place])    x years  . Mixed hyperlipidemia   . Myocardial infarction ([place]) ~2017  . Nontoxic nodular goiter    unspecified  . Occlusion and stenosis of unspecified carotid artery    without mention of cerebral infarction  . Osteoarthritis   . Osteoporosis   . Other constipation   . Other vitamin B12 deficiency anemias   . Otitis media, unspecified, unspecified ear   . Ovarian failure    unspecified  . Pain in limb   . Panic disorder without agoraphobia   . Peripheral vascular disease ([place])    unspecified  . Phlebitis of left arm   . RA (rheumatoid arthritis) ([place])   . Sigmoid polyp 1998  . Sleep disturbance    unspecified  . Stroke ([place]) 02/2017   "lots of mini strokes; big one 02/2017; that one made me weak in my knees,; never fully recovered" (03/18/2018)  . Syncope and collapse   . Thalassemia   . TIA (transient ischemic attack)    2000 and 2008 right carotid stent 08/14/2009 on Plavix  . TIA (transient ischemic attack) 03/18/2018  . Transient disorder of initiating or maintaining sleep   . Type II diabetes mellitus ([place])   . Varicose veins of bilateral lower extremities with other complications   . Varicose veins of bilateral lower extremities with other complications   . Vision abnormalities     Family History: Family History  Problem Relation Age of Onset  . Acute myelogenous leukemia Brother   . Anemia Brother   . Coronary  artery disease Brother   . Heart disease Brother   . Stroke Brother   . Heart failure Brother   . Stroke Mother   . Anemia Mother   . Heart disease Mother   . Heart failure Mother   . Hypertension Mother   . Osteoarthritis Mother   . Rheum arthritis Mother   . Heart attack Father   . Anemia Sister   . Cataracts Sister   . Osteoarthritis Sister   . Rheum arthritis  Sister   . Stroke Sister   . Heart disease Sister   . Thalassemia Sister   . Thalassemia Sister   . Blindness Neg Hx   . Glaucoma Neg Hx   . Macular degeneration Neg Hx   . Strabismus Neg Hx   . Vision loss Neg Hx   . Basal cell carcinoma Neg Hx   . GU problems Neg Hx   . Kidney cancer Neg Hx   . Melanoma Neg Hx   . Kidney disease Neg Hx   . Prostate cancer Neg Hx   . Squamous cell carcinoma Neg Hx     Social History   Socioeconomic History  . Marital status: Married    Spouse name: Not on file  . Number of children: Not on file  . Years of education: Not on file  . Highest education level: Not on file  Occupational History  . Not on file  Social Needs  . Financial resource strain: Not on file  . Food insecurity    Worry: Not on file    Inability: Not on file  . Transportation needs    Medical: Not on file    Non-medical: Not on file  Tobacco Use  . Smoking status: Never Smoker  . Smokeless tobacco: Never Used  Substance and Sexual Activity  . Alcohol use: No  . Drug use: No  . Sexual activity: Never  Lifestyle  . Physical activity    Days per week: Not on file    Minutes per session: Not on file  . Stress: Not on file  Relationships  . Social Herbalist on phone: Not on file    Gets together: Not on file    Attends religious service: Not on file    Active member of club or organization: Not on file    Attends meetings of clubs or organizations: Not on file    Relationship status: Not on file  . Intimate partner violence    Fear of current or ex partner: Not on file     Emotionally abused: Not on file    Physically abused: Not on file    Forced sexual activity: Not on file  Other Topics Concern  . Not on file  Social History Narrative  . Not on file      Review of Systems  Constitutional: Positive for activity change and fatigue. Negative for appetite change, chills and fever.  HENT: Positive for sinus pressure and sinus pain. Negative for ear pain and hearing loss.        Sinus pressure/pain r/t seasonal allergies  Eyes: Positive for visual disturbance.       Occasional changes in vision  Respiratory: Negative for cough, chest tightness, shortness of breath and wheezing.   Cardiovascular: Negative for chest pain and palpitations.  Gastrointestinal: Positive for constipation, nausea and vomiting. Negative for diarrhea.       Bowel movements approx once every 3 weeks Vomiting most nights bile. Feels nauseated frequently.   Endocrine: Positive for cold intolerance. Negative for heat intolerance, polydipsia and polyuria.  Genitourinary:       Frequent UTI's  Musculoskeletal: Positive for gait problem.       Has had frequent falls, has pain and weakness in her legs, requires a walker for ambulation   Neurological: Positive for weakness and light-headedness.  Hematological: Negative for adenopathy.  Psychiatric/Behavioral: The patient is nervous/anxious.     Today's Vitals   07/19/19 1102  BP: (!) 161/82  Pulse: 80  Resp: 16  Temp: (!) 97.4 F (36.3 C)  SpO2: 97%  Weight: 89 lb (40.4 kg)  Height: 5' (1.524 m)   Body mass index is 17.38 kg/m.  Physical Exam Vitals signs and nursing note reviewed. Exam conducted with a chaperone present.  Constitutional:      Appearance: Normal appearance.  HENT:     Head: Normocephalic and atraumatic.     Nose: Nose normal.  Eyes:     Extraocular Movements: Extraocular movements intact.     Pupils: Pupils are equal, round, and reactive to light.  Neck:     Musculoskeletal: Normal range of motion  and neck supple.  Cardiovascular:     Rate and Rhythm: Normal rate and regular rhythm.     Heart sounds: Normal heart sounds.  Pulmonary:     Effort: Pulmonary effort is normal.     Breath sounds: Normal breath sounds.  Abdominal:     General: Abdomen is flat. Bowel sounds are normal. There is no distension.     Palpations: Abdomen is soft.     Tenderness: There is no abdominal tenderness.  Skin:    General: Skin is warm and dry.  Neurological:     Mental Status: She is alert and oriented to person, place, and time. Mental status is at baseline.  Psychiatric:        Mood and Affect: Mood normal.        Behavior: Behavior normal.        Thought Content: Thought content normal.        Judgment: Judgment normal.    Assessment/Plan: 1. Gastroesophageal reflux disease without esophagitis Start omeprazole 20mg  daily. Advised she avoid triggers and sleep with HOB raised to 30 degrees.  - omeprazole (PRILOSEC) 20 MG capsule; Take 1 capsule (20 mg total) by mouth daily.  Dispense: 30 capsule; Refill: 2  2. Weakness of both legs Continue to use rolling walker to help with ambulation.   3. Acquired hypothyroidism Stable. Continue levothyroxine as prescribed.   4. Essential hypertension, benign Generally stale. Continue bo medication as prescribed.   General Counseling: Rindi verbalizes understanding of the findings of todays visit and agrees with plan of treatment. I have discussed any further diagnostic evaluation that may be needed or ordered today. We also reviewed her medications today. she has been encouraged to call the office with any questions or concerns that should arise related to todays visit.  Hypertension Counseling:   The following hypertensive lifestyle modification were recommended and discussed:  1. Limiting alcohol intake to less than 1 oz/day of ethanol:(24 oz of beer or 8 oz of wine or 2 oz of 100-proof whiskey). 2. Take baby ASA 81 mg daily. 3. Importance of  regular aerobic exercise and losing weight. 4. Reduce dietary saturated fat and cholesterol intake for overall cardiovascular health. 5. Maintaining adequate dietary potassium, calcium, and magnesium intake. 6. Regular monitoring of the blood pressure. 7. Reduce sodium intake to less than 100 mmol/day (less than 2.3 gm of sodium or less than 6 gm of sodium choride)   This patient was seen by Sam Rayburn with Dr [name] as a part of collaborative care agreement  Meds ordered this encounter  Medications  . omeprazole (PRILOSEC) 20 MG capsule    Sig: Take 1 capsule (20 mg total) by mouth daily.    Dispense:  30 capsule    Refill:  2    Order Specific Question:   Supervising Provider  AnswerLavera Guise [[id number]]    Time spent: 70 Minutes      Dr [name] Internal medicine

## 2019-07-24 ENCOUNTER — Other Ambulatory Visit: Payer: Self-pay

## 2019-07-24 DIAGNOSIS — E039 Hypothyroidism, unspecified: Secondary | ICD-10-CM

## 2019-07-24 MED ORDER — LEVOTHYROXINE SODIUM 50 MCG PO TABS: 50.0000 ug | ORAL_TABLET | Freq: Every day | ORAL | 1 refills | Status: AC

## 2019-07-28 ENCOUNTER — Other Ambulatory Visit: Payer: Self-pay | Admitting: Internal Medicine

## 2019-07-30 DIAGNOSIS — K219 Gastro-esophageal reflux disease without esophagitis: Secondary | ICD-10-CM | POA: Insufficient documentation

## 2019-08-08 ENCOUNTER — Emergency Department (HOSPITAL_COMMUNITY): Payer: Medicare Other

## 2019-08-08 ENCOUNTER — Inpatient Hospital Stay (HOSPITAL_COMMUNITY)
Admission: EM | Admit: 2019-08-08 | Discharge: 2019-08-25 | DRG: 065 | Disposition: E | Payer: Medicare Other | Attending: Internal Medicine | Admitting: Internal Medicine

## 2019-08-08 ENCOUNTER — Encounter (HOSPITAL_COMMUNITY): Payer: Self-pay | Admitting: Emergency Medicine

## 2019-08-08 DIAGNOSIS — Z20828 Contact with and (suspected) exposure to other viral communicable diseases: Secondary | ICD-10-CM | POA: Diagnosis present

## 2019-08-08 DIAGNOSIS — R4701 Aphasia: Secondary | ICD-10-CM | POA: Diagnosis present

## 2019-08-08 DIAGNOSIS — M1990 Unspecified osteoarthritis, unspecified site: Secondary | ICD-10-CM | POA: Diagnosis present

## 2019-08-08 DIAGNOSIS — Z743 Need for continuous supervision: Secondary | ICD-10-CM | POA: Diagnosis not present

## 2019-08-08 DIAGNOSIS — Z03818 Encounter for observation for suspected exposure to other biological agents ruled out: Secondary | ICD-10-CM | POA: Diagnosis not present

## 2019-08-08 DIAGNOSIS — E039 Hypothyroidism, unspecified: Secondary | ICD-10-CM | POA: Diagnosis present

## 2019-08-08 DIAGNOSIS — R29724 NIHSS score 24: Secondary | ICD-10-CM | POA: Diagnosis present

## 2019-08-08 DIAGNOSIS — R404 Transient alteration of awareness: Secondary | ICD-10-CM | POA: Diagnosis not present

## 2019-08-08 DIAGNOSIS — E1122 Type 2 diabetes mellitus with diabetic chronic kidney disease: Secondary | ICD-10-CM | POA: Diagnosis present

## 2019-08-08 DIAGNOSIS — Z96651 Presence of right artificial knee joint: Secondary | ICD-10-CM | POA: Diagnosis present

## 2019-08-08 DIAGNOSIS — I10 Essential (primary) hypertension: Secondary | ICD-10-CM | POA: Diagnosis not present

## 2019-08-08 DIAGNOSIS — R9431 Abnormal electrocardiogram [ECG] [EKG]: Secondary | ICD-10-CM

## 2019-08-08 DIAGNOSIS — Z885 Allergy status to narcotic agent status: Secondary | ICD-10-CM

## 2019-08-08 DIAGNOSIS — M069 Rheumatoid arthritis, unspecified: Secondary | ICD-10-CM | POA: Diagnosis present

## 2019-08-08 DIAGNOSIS — I63412 Cerebral infarction due to embolism of left middle cerebral artery: Secondary | ICD-10-CM | POA: Diagnosis present

## 2019-08-08 DIAGNOSIS — D563 Thalassemia minor: Secondary | ICD-10-CM | POA: Diagnosis present

## 2019-08-08 DIAGNOSIS — I2510 Atherosclerotic heart disease of native coronary artery without angina pectoris: Secondary | ICD-10-CM | POA: Diagnosis present

## 2019-08-08 DIAGNOSIS — R0689 Other abnormalities of breathing: Secondary | ICD-10-CM | POA: Diagnosis not present

## 2019-08-08 DIAGNOSIS — I129 Hypertensive chronic kidney disease with stage 1 through stage 4 chronic kidney disease, or unspecified chronic kidney disease: Secondary | ICD-10-CM | POA: Diagnosis present

## 2019-08-08 DIAGNOSIS — I669 Occlusion and stenosis of unspecified cerebral artery: Secondary | ICD-10-CM | POA: Diagnosis not present

## 2019-08-08 DIAGNOSIS — I252 Old myocardial infarction: Secondary | ICD-10-CM

## 2019-08-08 DIAGNOSIS — Z823 Family history of stroke: Secondary | ICD-10-CM

## 2019-08-08 DIAGNOSIS — N183 Chronic kidney disease, stage 3 unspecified: Secondary | ICD-10-CM | POA: Diagnosis present

## 2019-08-08 DIAGNOSIS — D696 Thrombocytopenia, unspecified: Secondary | ICD-10-CM | POA: Diagnosis present

## 2019-08-08 DIAGNOSIS — I6522 Occlusion and stenosis of left carotid artery: Secondary | ICD-10-CM | POA: Diagnosis not present

## 2019-08-08 DIAGNOSIS — I639 Cerebral infarction, unspecified: Secondary | ICD-10-CM | POA: Diagnosis not present

## 2019-08-08 DIAGNOSIS — J45909 Unspecified asthma, uncomplicated: Secondary | ICD-10-CM | POA: Diagnosis present

## 2019-08-08 DIAGNOSIS — E1151 Type 2 diabetes mellitus with diabetic peripheral angiopathy without gangrene: Secondary | ICD-10-CM | POA: Diagnosis present

## 2019-08-08 DIAGNOSIS — Z856 Personal history of leukemia: Secondary | ICD-10-CM

## 2019-08-08 DIAGNOSIS — R000 Tachycardia, unspecified: Secondary | ICD-10-CM | POA: Diagnosis not present

## 2019-08-08 DIAGNOSIS — Z7982 Long term (current) use of aspirin: Secondary | ICD-10-CM

## 2019-08-08 DIAGNOSIS — E876 Hypokalemia: Secondary | ICD-10-CM

## 2019-08-08 DIAGNOSIS — R29818 Other symptoms and signs involving the nervous system: Secondary | ICD-10-CM | POA: Diagnosis not present

## 2019-08-08 DIAGNOSIS — Z8249 Family history of ischemic heart disease and other diseases of the circulatory system: Secondary | ICD-10-CM

## 2019-08-08 DIAGNOSIS — R4781 Slurred speech: Secondary | ICD-10-CM | POA: Diagnosis not present

## 2019-08-08 DIAGNOSIS — G8191 Hemiplegia, unspecified affecting right dominant side: Secondary | ICD-10-CM | POA: Diagnosis present

## 2019-08-08 DIAGNOSIS — Z880 Allergy status to penicillin: Secondary | ICD-10-CM

## 2019-08-08 DIAGNOSIS — E782 Mixed hyperlipidemia: Secondary | ICD-10-CM | POA: Diagnosis present

## 2019-08-08 DIAGNOSIS — Z888 Allergy status to other drugs, medicaments and biological substances status: Secondary | ICD-10-CM

## 2019-08-08 DIAGNOSIS — Z8261 Family history of arthritis: Secondary | ICD-10-CM

## 2019-08-08 DIAGNOSIS — Z8673 Personal history of transient ischemic attack (TIA), and cerebral infarction without residual deficits: Secondary | ICD-10-CM

## 2019-08-08 DIAGNOSIS — K219 Gastro-esophageal reflux disease without esophagitis: Secondary | ICD-10-CM | POA: Diagnosis present

## 2019-08-08 DIAGNOSIS — Z66 Do not resuscitate: Secondary | ICD-10-CM | POA: Diagnosis present

## 2019-08-08 DIAGNOSIS — Z91040 Latex allergy status: Secondary | ICD-10-CM

## 2019-08-08 DIAGNOSIS — Z955 Presence of coronary angioplasty implant and graft: Secondary | ICD-10-CM

## 2019-08-08 DIAGNOSIS — R092 Respiratory arrest: Secondary | ICD-10-CM | POA: Diagnosis not present

## 2019-08-08 DIAGNOSIS — M810 Age-related osteoporosis without current pathological fracture: Secondary | ICD-10-CM | POA: Diagnosis present

## 2019-08-08 DIAGNOSIS — R52 Pain, unspecified: Secondary | ICD-10-CM | POA: Diagnosis not present

## 2019-08-08 DIAGNOSIS — Z806 Family history of leukemia: Secondary | ICD-10-CM

## 2019-08-08 DIAGNOSIS — I6340 Cerebral infarction due to embolism of unspecified cerebral artery: Secondary | ICD-10-CM | POA: Insufficient documentation

## 2019-08-08 LAB — COMPREHENSIVE METABOLIC PANEL
ALT: 13 U/L (ref 0–44)
AST: 24 U/L (ref 15–41)
Albumin: 3.5 g/dL (ref 3.5–5.0)
Alkaline Phosphatase: 22 U/L — ABNORMAL LOW (ref 38–126)
Anion gap: 11 (ref 5–15)
BUN: 16 mg/dL (ref 8–23)
CO2: 23 mmol/L (ref 22–32)
Calcium: 9.2 mg/dL (ref 8.9–10.3)
Chloride: 108 mmol/L (ref 98–111)
Creatinine, Ser: 1.37 mg/dL — ABNORMAL HIGH (ref 0.44–1.00)
GFR calc Af Amer: 40 mL/min — ABNORMAL LOW (ref >60)
GFR calc non Af Amer: 35 mL/min — ABNORMAL LOW (ref >60)
Glucose, Bld: 116 mg/dL — ABNORMAL HIGH (ref 70–99)
Potassium: 3.4 mmol/L — ABNORMAL LOW (ref 3.5–5.1)
Sodium: 142 mmol/L (ref 135–145)
Total Bilirubin: 1.1 mg/dL (ref 0.3–1.2)
Total Protein: 6.4 g/dL — ABNORMAL LOW (ref 6.5–8.1)

## 2019-08-08 LAB — CBC
HCT: 31.5 % — ABNORMAL LOW (ref 36.0–46.0)
Hemoglobin: 9.2 g/dL — ABNORMAL LOW (ref 12.0–15.0)
MCH: 18.7 pg — ABNORMAL LOW (ref 26.0–34.0)
MCHC: 29.2 g/dL — ABNORMAL LOW (ref 30.0–36.0)
MCV: 63.9 fL — ABNORMAL LOW (ref 80.0–100.0)
Platelets: 109 10*3/uL — ABNORMAL LOW (ref 150–400)
RBC: 4.93 MIL/uL (ref 3.87–5.11)
RDW: 17.9 % — ABNORMAL HIGH (ref 11.5–15.5)
WBC: 6.2 10*3/uL (ref 4.0–10.5)
nRBC: 0 % (ref 0.0–0.2)

## 2019-08-08 LAB — DIFFERENTIAL
Abs Immature Granulocytes: 0.03 10*3/uL (ref 0.00–0.07)
Basophils Absolute: 0 10*3/uL (ref 0.0–0.1)
Basophils Relative: 1 %
Eosinophils Absolute: 0.2 10*3/uL (ref 0.0–0.5)
Eosinophils Relative: 4 %
Immature Granulocytes: 1 %
Lymphocytes Relative: 45 %
Lymphs Abs: 2.9 10*3/uL (ref 0.7–4.0)
Monocytes Absolute: 1 10*3/uL (ref 0.1–1.0)
Monocytes Relative: 16 %
Neutro Abs: 2 10*3/uL (ref 1.7–7.7)
Neutrophils Relative %: 33 %

## 2019-08-08 LAB — URINALYSIS, ROUTINE W REFLEX MICROSCOPIC
Bacteria, UA: NONE SEEN
Bilirubin Urine: NEGATIVE
Glucose, UA: NEGATIVE mg/dL
Ketones, ur: NEGATIVE mg/dL
Leukocytes,Ua: NEGATIVE
Nitrite: NEGATIVE
Protein, ur: NEGATIVE mg/dL
Specific Gravity, Urine: 1.024 (ref 1.005–1.030)
pH: 6 (ref 5.0–8.0)

## 2019-08-08 LAB — I-STAT CHEM 8, ED
BUN: 17 mg/dL (ref 8–23)
Calcium, Ion: 1.21 mmol/L (ref 1.15–1.40)
Chloride: 106 mmol/L (ref 98–111)
Creatinine, Ser: 1.3 mg/dL — ABNORMAL HIGH (ref 0.44–1.00)
Glucose, Bld: 109 mg/dL — ABNORMAL HIGH (ref 70–99)
HCT: 32 % — ABNORMAL LOW (ref 36.0–46.0)
Hemoglobin: 10.9 g/dL — ABNORMAL LOW (ref 12.0–15.0)
Potassium: 3.5 mmol/L (ref 3.5–5.1)
Sodium: 143 mmol/L (ref 135–145)
TCO2: 25 mmol/L (ref 22–32)

## 2019-08-08 LAB — PROTIME-INR
INR: 1.1 (ref 0.8–1.2)
Prothrombin Time: 14.4 seconds (ref 11.4–15.2)

## 2019-08-08 LAB — CBG MONITORING, ED: Glucose-Capillary: 122 mg/dL — ABNORMAL HIGH (ref 70–99)

## 2019-08-08 LAB — RAPID URINE DRUG SCREEN, HOSP PERFORMED
Amphetamines: NOT DETECTED
Barbiturates: NOT DETECTED
Benzodiazepines: NOT DETECTED
Cocaine: NOT DETECTED
Opiates: NOT DETECTED
Tetrahydrocannabinol: NOT DETECTED

## 2019-08-08 LAB — APTT: aPTT: 31 seconds (ref 24–36)

## 2019-08-08 MED ORDER — LABETALOL HCL 5 MG/ML IV SOLN
INTRAVENOUS | Status: AC | PRN
  Administered 2019-08-08: 20 mg via INTRAVENOUS

## 2019-08-08 MED ORDER — SENNOSIDES-DOCUSATE SODIUM 8.6-50 MG PO TABS: 1.0000 | ORAL_TABLET | Freq: Every evening | ORAL | Status: DC | PRN

## 2019-08-08 MED ORDER — CLEVIDIPINE BUTYRATE 0.5 MG/ML IV EMUL
0.0000 mg/h | INTRAVENOUS | Status: DC
  Administered 2019-08-08: 1 mg/h via INTRAVENOUS

## 2019-08-08 MED ORDER — CLEVIDIPINE BUTYRATE 0.5 MG/ML IV EMUL
INTRAVENOUS | Status: AC
  Filled 2019-08-08: qty 50

## 2019-08-08 MED ORDER — POTASSIUM CHLORIDE 10 MEQ/100ML IV SOLN
10.0000 meq | INTRAVENOUS | Status: DC
  Administered 2019-08-09: 10 meq via INTRAVENOUS
  Filled 2019-08-08: qty 100

## 2019-08-08 MED ORDER — ASPIRIN 300 MG RE SUPP
300.0000 mg | Freq: Every day | RECTAL | Status: DC
  Administered 2019-08-09: 300 mg via RECTAL
  Filled 2019-08-08: qty 1

## 2019-08-08 MED ORDER — LABETALOL HCL 5 MG/ML IV SOLN
INTRAVENOUS | Status: AC
  Administered 2019-08-08: 20 mg
  Filled 2019-08-08: qty 4

## 2019-08-08 MED ORDER — SODIUM CHLORIDE 0.9 % IV SOLN: INTRAVENOUS | Status: DC

## 2019-08-08 MED ORDER — CLEVIDIPINE BUTYRATE 0.5 MG/ML IV EMUL
INTRAVENOUS | Status: AC | PRN
  Administered 2019-08-08: 1 mg/h via INTRAVENOUS

## 2019-08-08 MED ORDER — ACETAMINOPHEN 325 MG PO TABS: 650.0000 mg | ORAL_TABLET | ORAL | Status: DC | PRN

## 2019-08-08 MED ORDER — ACETAMINOPHEN 160 MG/5ML PO SOLN: 650.0000 mg | ORAL | Status: DC | PRN

## 2019-08-08 MED ORDER — IOHEXOL 350 MG/ML SOLN
100.0000 mL | Freq: Once | INTRAVENOUS | Status: AC | PRN
  Administered 2019-08-08: 100 mL via INTRAVENOUS

## 2019-08-08 MED ORDER — STROKE: EARLY STAGES OF RECOVERY BOOK
Freq: Once | Status: AC
  Filled 2019-08-08: qty 1

## 2019-08-08 MED ORDER — ASPIRIN 325 MG PO TABS: 325.0000 mg | ORAL_TABLET | Freq: Every day | ORAL | Status: DC

## 2019-08-08 MED ORDER — HEPARIN SODIUM (PORCINE) 5000 UNIT/ML IJ SOLN
5000.0000 [IU] | Freq: Three times a day (TID) | INTRAMUSCULAR | Status: DC
  Administered 2019-08-09: 5000 [IU] via SUBCUTANEOUS
  Filled 2019-08-08: qty 1

## 2019-08-08 MED ORDER — ACETAMINOPHEN 650 MG RE SUPP: 650.0000 mg | RECTAL | Status: DC | PRN

## 2019-08-08 NOTE — Plan of Care (Signed)
BP parameters - allow permissive hypertension for 24-48h. Treat PRN only if SBP>220. Relayed to Dr. Marlowe Sax, admitting MD  -- Amie Portland, MD [facility] Pager: [phone] If 7pm to 7am, please call on call as listed on AMION.

## 2019-08-08 NOTE — ED Notes (Signed)
MD AROOR ELECTED TO BYPASS BLOODWORK AT THE BRIDGE AND PROCEED TO CT

## 2019-08-08 NOTE — ED Notes (Signed)
Purewick has been placed on patient. Patient was also cleaned and changed into a new brief.

## 2019-08-08 NOTE — ED Notes (Signed)
Pt Arrives via [facility] after having a sudden onset of left sided gauze and right side neglect- pt is aphasic and only responsive to pain. Pt is alert but unable to answer questions. Pt hypertensive w/ [place] [id number] systolic. Pt taken right to CT.

## 2019-08-08 NOTE — ED Provider Notes (Signed)
[facility] EMERGENCY DEPARTMENT Provider Note   CSN: [id number] Arrival date & time: 08/19/2019  [id number]  An emergency department physician performed an initial assessment on this suspected stroke patient at 1853.  History Chief Complaint  Patient presents with  . Code Stroke    Stephanie Harris is a 85 y.o. female.  HPI      85yo female with history of right MCA stroke, right ICA stent, leukemia, anemia, thrombocytopenia, CAD, htn, ICH, hypothyroidism, peripheral vascular disase, RA, presented as Code Stroke for right sided weakness, aphasia and leftward gaze LNW 4PM.    History is limited by patient condition and acuity of condition.  Neurologist, Dr. Rory Percy spoke with family who had called EMS.    Past Medical History:  Diagnosis Date  . Acute bronchiolitis   . Acute pharyngitis   . Allergic rhinitis due to pollen   . Anemia, unspecified    CKD and LGL  . Anxiety state    unspecified  . Arthritis    "knees, legs" (03/18/2018)  . Asthma without status asthmaticus    unspecified  . B12 deficiency   . Beta thalassemia trait   . Carotid artery occlusion   . Cellulitis and abscess of leg, except foot   . Cervical spondylosis   . Cervicalgia   . Chronic kidney disease   . Chronic lower back pain   . CML (chronic myelocytic leukemia) ([place])    Onc at [facility]  . Complication of anesthesia    "last back surgery they liked to never get me awake" (03/18/2018)  . Coronary artery disease 2009   heart attack with stent  . Coronary atherosclerosis of native coronary artery   . Depressive disorder    not elsewhere classified  . Difficulty in walking   . Disorder of breast, unspecified   . Dizziness and giddiness   . Dysuria   . Esophageal reflux   . Essential hypertension    unspecified  . Family history of adverse reaction to anesthesia    "liked to never get my sister awake" (03/18/2018)  . Head injury    unspecified  . Headache    "q time I have a stroke"  (03/18/2018)  . Heart disease   . Hematuria, unspecified   . History of blood transfusion    "had 22 when they found out I had leukemia" (03/18/2018)  . Hypersomnia, unspecified   . Hypertension   . Hypopotassemia   . Hypothyroidism   . ICH (intracerebral hemorrhage) ([place])   . Ill-defined cerebrovascular disease    other  . Insomnia    unspecified  . 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 Leukemia ([place])   . Lower urinary tract infection   . Lumbago   . Mini stroke ([place])    x years  . Mixed hyperlipidemia   . Myocardial infarction ([place]) ~2017  . Nontoxic nodular goiter    unspecified  . Occlusion and stenosis of unspecified carotid artery    without mention of cerebral infarction  . Osteoarthritis   . Osteoporosis   . Other constipation   . Other vitamin B12 deficiency anemias   . Otitis media, unspecified, unspecified ear   . Ovarian failure    unspecified  . Pain in limb   . Panic disorder without agoraphobia   . Peripheral vascular disease ([place])    unspecified  . Phlebitis of left arm   . RA (rheumatoid arthritis) ([place])   . Sigmoid polyp 1998  .  Sleep disturbance    unspecified  . Stroke ([place]) 02/2017   "lots of mini strokes; big one 02/2017; that one made me weak in my knees,; never fully recovered" (03/18/2018)  . Syncope and collapse   . Thalassemia   . TIA (transient ischemic attack)    2000 and 2008 right carotid stent 08/14/2009 on Plavix  . TIA (transient ischemic attack) 03/18/2018  . Transient disorder of initiating or maintaining sleep   . Type II diabetes mellitus ([place])   . Varicose veins of bilateral lower extremities with other complications   . Varicose veins of bilateral lower extremities with other complications   . Vision abnormalities     Patient Active Problem List   Diagnosis Date Noted  . Cerebral embolism with cerebral infarction 08/17/2019  . Acute CVA (cerebrovascular accident) ([place]) 08/01/2019  . Hypokalemia 08/05/2019    . QT prolongation 07/28/2019  . Gastroesophageal reflux disease without esophagitis 07/30/2019  . Acute metabolic encephalopathy [id number]  . Troponin I above reference range 05/29/2019  . Weakness of both legs 03/12/2019  . Abnormality of gait 03/12/2019  . Peripheral edema 01/12/2019  . Skin lesion of right lower extremity 12/06/2018  . Diabetes mellitus without complication ([place]) [id number]  . Urinary tract infection without hematuria 11/06/2018  . Bladder pain 11/06/2018  . Large granular lymphocytosis 10/05/2018  . Hypoglycemia 08/31/2018  . Nausea 08/31/2018  . B12 deficiency 08/31/2018  . Mini stroke ([place]) 08/30/2018  . Acquired hypothyroidism 07/11/2018  . Dyslipidemia 07/11/2018  . CKD (chronic kidney disease), stage III 07/11/2018  . Sacral fracture, closed ([place]) 07/11/2018  . Traumatic hematoma of buttock 07/11/2018  . Uses walker 07/07/2018  . Frequent falls 07/02/2018  . Sacral fracture, closed ([place]) 07/01/2018  . Traumatic hematoma of buttock 07/01/2018  . At risk for domestic violence 05/06/2018  . Pain in right foot 04/06/2018  . Cerebrovascular accident (CVA) ([place]) 04/05/2018  . Essential hypertension, benign 04/05/2018  . Carotid artery calcification, bilateral 04/05/2018  . Occlusion and stenosis of both vertebral arteries   . Stenosis of carotid artery   . Hypertensive emergency   . S/P kyphoplasty 12/16/2017  . Age-related osteoporosis with current pathological fracture 11/10/2017  . Protein-calorie malnutrition, severe 11/08/2017  . Lumbar compression fracture ([place]) 11/05/2017  . Spondylosis of lumbar region without myelopathy or radiculopathy 10/08/2017  . Chronic midline low back pain 08/29/2017  . Closed wedge compression fracture of first lumbar vertebra with delayed healing 08/29/2017  . Anemia in stage 3 chronic kidney disease 06/02/2017  . Left-sided nontraumatic intracerebral hemorrhage ([place]) 03/23/2017  . Primary osteoarthritis of left  shoulder 12/31/2016  . Primary osteoarthritis of one knee, left 12/31/2016  . Closed fracture of proximal end of left humerus with routine healing 08/28/2016  . Gross hematuria 06/25/2016  . Incomplete emptying of bladder 06/25/2016  . Moderate mitral insufficiency 10/31/2015  . Syncope and collapse 10/31/2015  . Continuous leakage of urine 08/30/2015  . Frequent UTI 08/30/2015  . Muscle weakness of lower extremity 03/20/2015  . Chest pain 03/14/2015  . TIA (transient ischemic attack) 01/29/2015  . Allergic arthritis, hand   . Adhesive capsulitis of left shoulder 12/13/2014  . SI joint arthritis 07/05/2014  . Mixed hyperlipidemia 05/22/2014  . Temporary cerebral vascular dysfunction 05/22/2014  . Irritable bowel syndrome with constipation 05/03/2014  . Dysphagia 05/03/2014  . Weight loss 05/03/2014  . Bilateral carotid artery disease ([place]) 04/18/2014  . Carotid atherosclerosis 04/18/2014  . Cerebral infarction, unspecified ([place]) 04/18/2014  . Cerebral artery  occlusion with cerebral infarction ([place]) 01/11/2014  . Coronary atherosclerosis 01/11/2014  . Closed fracture of lumbar vertebra ([place]) 12/19/2013  . Non-traumatic compression fracture of second lumbar vertebra ([place]) 12/19/2013  . Fall in home 05/19/2013  . Compression fracture of T12 vertebra ([place]) 04/27/2013  . Closed fracture of dorsal (thoracic) vertebra ([place]) 04/27/2013  . 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 Anemia due to stage 3 chronic kidney disease 06/15/2012  . Lymphoid leukemia ([place]) 06/15/2012  . Unspecified visual loss 05/20/2012  . Degenerative drusen 05/06/2012  . Drusen, retina 05/06/2012    Past Surgical History:  Procedure Laterality Date  . BACK SURGERY    . CAROTID ENDARTERECTOMY Left   . CAROTID STENT INSERTION Right    "have 2 stents in there" (03/18/2018)  . CATARACT EXTRACTION W/ INTRAOCULAR LENS  IMPLANT, BILATERAL Bilateral 06/16/2001 - 05/28/2003   +23.5D     22.5D    . CHOLECYSTECTOMY OPEN  1980  . COLONOSCOPY  2010  . CORONARY ANGIOPLASTY WITH STENT PLACEMENT    . EYELID SURGERY Bilateral 2005   BUL BLEPH  . FOOT SURGERY  05/13/2019  . FRACTURE SURGERY    . JOINT REPLACEMENT    . KYPHOPLASTY N/A 08/12/2017   Procedure: KYPHOPLASTY;  Surgeon: Hessie Knows, MD;  Location: [facility] ORS;  Service: Orthopedics;  Laterality: N/A;  . KYPHOPLASTY N/A 11/08/2017   Procedure: Hewitt Shorts;  Surgeon: Hessie Knows, MD;  Location: [facility] ORS;  Service: Orthopedics;  Laterality: N/A;  . PERCUTANEOUS PLACEMENT INTRAVASCULAR STENT CERVICAL CAROTID ARTERY  06/14/2009  . ROTATOR CUFF REPAIR Left   . TOTAL ABDOMINAL HYSTERECTOMY  1978   WITH REMOVAL TUBES & /OR OVARIES  . TOTAL KNEE ARTHROPLASTY Right      OB History   No obstetric history on file.     Family History  Problem Relation Age of Onset  . Acute myelogenous leukemia Brother   . Anemia Brother   . Coronary artery disease Brother   . Heart disease Brother   . Stroke Brother   . Heart failure Brother   . Stroke Mother   . Anemia Mother   . Heart disease Mother   . Heart failure Mother   . Hypertension Mother   . Osteoarthritis Mother   . Rheum arthritis Mother   . Heart attack Father   . Anemia Sister   . Cataracts Sister   . Osteoarthritis Sister   . Rheum arthritis Sister   . Stroke Sister   . Heart disease Sister   . Thalassemia Sister   . Thalassemia Sister   . Blindness Neg Hx   . Glaucoma Neg Hx   . Macular degeneration Neg Hx   . Strabismus Neg Hx   . Vision loss Neg Hx   . Basal cell carcinoma Neg Hx   . GU problems Neg Hx   . Kidney cancer Neg Hx   . Melanoma Neg Hx   . Kidney disease Neg Hx   . Prostate cancer Neg Hx   . Squamous cell carcinoma Neg Hx     Social History   Tobacco Use  . Smoking status: Never Smoker  . Smokeless tobacco: Never Used  Substance Use Topics  . Alcohol use: No  . Drug use: No    Home Medications Prior to Admission medications    Medication Sig Start Date End Date Taking? Authorizing Provider  aspirin 81 MG chewable tablet Chew 81 mg by mouth daily.    [provider]  carvedilol (  COREG) 12.5 MG tablet Take 1 tablet (12.5 mg total) by mouth 2 (two) times daily with a meal. 05/31/19   Vaughan Basta, MD  cyanocobalamin (,VITAMIN B-12,) 1000 MCG/ML injection INJECT 1ML EVERY MONTH AS DIRECTED 07/28/19   [name], MD  levothyroxine (SYNTHROID) 50 MCG tablet Take 1 tablet (50 mcg total) by mouth daily. Name brand is medically necessary. 07/24/19   Ronnell Freshwater, NP  Multiple Vitamin (MULTIVITAMIN WITH MINERALS) TABS tablet Take 1 tablet by mouth daily. 06/01/19   Vaughan Basta, MD  omeprazole (PRILOSEC) 20 MG capsule Take 1 capsule (20 mg total) by mouth daily. 07/19/19   Ronnell Freshwater, NP  ondansetron (ZOFRAN ODT) 4 MG disintegrating tablet Take 1 tablet (4 mg total) by mouth every 8 (eight) hours as needed for nausea or vomiting. 06/28/19   Ronnell Freshwater, NP    Allergies    Latex, Meperidine, Penicillins, Cefuroxime axetil, Codeine, and Propoxyphene  Review of Systems   Review of Systems  Unable to perform ROS: Mental status change    Physical Exam Updated Vital Signs BP (!) 167/64   Pulse 70   Temp 98.6 F (37 C) (Rectal)   Resp 17   Ht 5' (1.524 m)   Wt 36.8 kg   SpO2 96%   BMI 15.84 kg/m   Physical Exam Vitals and nursing note reviewed.  Constitutional:      General: She is not in acute distress.    Appearance: She is well-developed. She is ill-appearing. She is not diaphoretic.  HENT:     Head: Normocephalic and atraumatic.  Eyes:     Conjunctiva/sclera: Conjunctivae normal.  Cardiovascular:     Rate and Rhythm: Normal rate and regular rhythm.  Pulmonary:     Effort: Pulmonary effort is normal. No respiratory distress.     Breath sounds: Normal breath sounds.  Abdominal:     General: There is no distension.     Palpations: Abdomen is soft.      Tenderness: There is no abdominal tenderness. There is no guarding.  Musculoskeletal:        General: No tenderness.     Cervical back: Normal range of motion.  Skin:    General: Skin is warm and dry.     Findings: No erythema or rash.  Neurological:     Mental Status: She is alert.     Comments: Eyes deviated to left, does not follow finger for EOM, does open mouth but will not stick out tongue, does not smile but do not see signs of facial droop. Not able to hold up right side, not following commands on left but noted to have some movements of left with stimulation, moves hand on command but does not participate to assess pronator drift.  Not able to answer questions, vocalizes/mumbles in response     ED Results / Procedures / Treatments   Labs (all labs ordered are listed, but only abnormal results are displayed) Labs Reviewed  CBC - Abnormal; Notable for the following components:      Result Value   Hemoglobin 9.2 (*)    HCT 31.5 (*)    MCV 63.9 (*)    MCH 18.7 (*)    MCHC 29.2 (*)    RDW 17.9 (*)    Platelets 109 (*)    All other components within normal limits  COMPREHENSIVE METABOLIC PANEL - Abnormal; Notable for the following components:   Potassium 3.4 (*)    Glucose, Bld 116 (*)  Creatinine, Ser 1.37 (*)    Total Protein 6.4 (*)    Alkaline Phosphatase 22 (*)    GFR calc non Af Amer 35 (*)    GFR calc Af Amer 40 (*)    All other components within normal limits  URINALYSIS, ROUTINE W REFLEX MICROSCOPIC - Abnormal; Notable for the following components:   Hgb urine dipstick SMALL (*)    All other components within normal limits  BASIC METABOLIC PANEL - Abnormal; Notable for the following components:   Glucose, Bld 130 (*)    Creatinine, Ser 1.22 (*)    GFR calc non Af Amer 40 (*)    GFR calc Af Amer 46 (*)    All other components within normal limits  GLUCOSE, CAPILLARY - Abnormal; Notable for the following components:   Glucose-Capillary 225 (*)    All other  components within normal limits  I-STAT CHEM 8, ED - Abnormal; Notable for the following components:   Creatinine, Ser 1.30 (*)    Glucose, Bld 109 (*)    Hemoglobin 10.9 (*)    HCT 32.0 (*)    All other components within normal limits  CBG MONITORING, ED - Abnormal; Notable for the following components:   Glucose-Capillary 122 (*)    All other components within normal limits  SARS CORONAVIRUS 2 (TAT 6-24 HRS)  MRSA PCR SCREENING  SARS CORONAVIRUS 2 (TAT 6-24 HRS)  ETHANOL  PROTIME-INR  APTT  DIFFERENTIAL  RAPID URINE DRUG SCREEN, HOSP PERFORMED  HEMOGLOBIN A1C  LIPID PANEL  MAGNESIUM    EKG EKG Interpretation  Date/Time:  Tuesday August 08 2019 19:51:39 EST Ventricular Rate:  71 PR Interval:    QRS Duration: 109 QT Interval:  482 QTC Calculation: 524 R Axis:   -34 Text Interpretation: Sinus rhythm Short PR interval LVH with secondary repolarization abnormality Prolonged QT interval Confirmed by Pryor Curia [phone]) on August 25, 2019 9:04:55 AM   Radiology CT Code Stroke CTA Head W/WO contrast  Result Date: 08/20/2019 CLINICAL DATA:  Stroke code EXAM: CT ANGIOGRAPHY HEAD AND NECK CT PERFUSION BRAIN TECHNIQUE: Multidetector CT imaging of the head and neck was performed using the standard protocol during bolus administration of intravenous contrast. Multiplanar CT image reconstructions and MIPs were obtained to evaluate the vascular anatomy. Carotid stenosis measurements (when applicable) are obtained utilizing NASCET criteria, using the distal internal carotid diameter as the denominator. Multiphase CT imaging of the brain was performed following IV bolus contrast injection. Subsequent parametric perfusion maps were calculated using RAPID software. CONTRAST:  115mL OMNIPAQUE IOHEXOL 350 MG/ML SOLN COMPARISON:  Noncontrast noncontrast head CT performed earlier the same day, CT angiogram head/neck 09/28/2018 FINDINGS: CTA NECK FINDINGS Aortic arch: Standard aortic branching. Soft  and calcified plaque within the visualized aortic arch and proximal major branch vessels of the neck. Right carotid system: The common and internal carotid arteries are patent without significant stenosis. A stent traversing the distal common carotid artery and intra stent stenosis. Left carotid system: The common internal carotid arteries are patent without significant stenosis (50% or greater), moderate calcified plaque at the carotid bifurcation and within the proximal ICA. Vertebral arteries: The vertebral arteries are codominant. Redemonstrated calcified plaque within the V1 right vertebral artery, just distal to the still origin, with resultant moderate/severe segmental stenosis. Redemonstrated calcified plaque at the origin of the left vertebral artery with suspected moderate ostial stenosis. More distally, the vertebral arteries are patent within the neck Skeleton: No acute bony abnormality. Other neck: No neck mass or cervical lymphadenopathy.  Upper chest: Interstitial thickening and ground-glass opacity within the partially imaged lung apices, nonspecific, but likely reflecting edema. Review of the MIP images confirms the above findings CTA HEAD FINDINGS Anterior circulation: The intracranial internal carotid arteries are patent bilaterally with scattered calcified plaque and no more than mild stenosis. The M1 right middle cerebral artery is patent without significant stenosis. No M2 proximal right MCA branch occlusion is identified. The right anterior cerebral artery is patent without significant proximal stenosis. The M1 left middle cerebral artery is patent without significant stenosis. There is abrupt occlusion of an M2 proximal left MCA branch shortly beyond its origin open (series 10, image 19) (series 12, image 27). The left anterior cerebral artery is patent without significant proximal stenosis. No intracranial aneurysm is identified. Posterior circulation: The intracranial vertebral arteries are  patent without significant stenosis, as is the basilar artery. The bilateral posterior cerebral arteries are patent. Foci of mild-to-moderate stenosis within the P2 right posterior cerebral artery. Venous sinuses: Within limitations of contrast timing, no convincing thrombus. Anatomic variants: Posterior communicating arteries are poorly delineated and may be hypoplastic or absent bilaterally. Review of the MIP images confirms the above findings CT Brain Perfusion Findings: CBF (<30%) Volume: 20mL left MCA vascular territory. Perfusion (Tmax>6.0s) volume: 21 mL within the left MCA vascular territory. Mismatch Volume: 7 mL Infarction Location:Left MCA vascular territory. These results were called by telephone at the time of interpretation on 08/21/2019 at 7:44 pm to provider [facility] , who verbally acknowledged these results. IMPRESSION: CTA neck: 1. Bilateral common and internal carotid arteries patent within the neck without significant stenosis (50% or greater). A stent spanning the distal common carotid artery and proximal cervical right ICA remains patent. Moderate calcified plaque at the left carotid bifurcation and within the proximal left ICA. 2. Calcified plaque within the V1 right vertebral artery with moderate/severe stenosis. Calcified plaque at the origin of the left vertebral artery with suspected moderate ostial stenosis. Findings are unchanged. CTA head: 1. Abrupt occlusion of a proximal M2 left MCA branch vessel shortly beyond its origin. 2. Sites of mild/moderate stenosis within the P2 right posterior cerebral artery. 3. Mild atherosclerotic disease of the intracranial internal carotid arteries. CT perfusion head: The perfusion software identifies a 14 mL core infarct within the left MCA vascular territory. In this same region, 21 mL of critically hypoperfused parenchyma is identified. Reported mismatch volume 7 mL. Reported mismatch ratio 1.5. Electronically Signed   By: Kellie Simmering DO   On:  08/05/2019 20:02   CT Code Stroke CTA Neck W/WO contrast  Result Date: 07/27/2019 CLINICAL DATA:  Stroke code EXAM: CT ANGIOGRAPHY HEAD AND NECK CT PERFUSION BRAIN TECHNIQUE: Multidetector CT imaging of the head and neck was performed using the standard protocol during bolus administration of intravenous contrast. Multiplanar CT image reconstructions and MIPs were obtained to evaluate the vascular anatomy. Carotid stenosis measurements (when applicable) are obtained utilizing NASCET criteria, using the distal internal carotid diameter as the denominator. Multiphase CT imaging of the brain was performed following IV bolus contrast injection. Subsequent parametric perfusion maps were calculated using RAPID software. CONTRAST:  119mL OMNIPAQUE IOHEXOL 350 MG/ML SOLN COMPARISON:  Noncontrast noncontrast head CT performed earlier the same day, CT angiogram head/neck 09/28/2018 FINDINGS: CTA NECK FINDINGS Aortic arch: Standard aortic branching. Soft and calcified plaque within the visualized aortic arch and proximal major branch vessels of the neck. Right carotid system: The common and internal carotid arteries are patent without significant stenosis. A stent traversing the  distal common carotid artery and intra stent stenosis. Left carotid system: The common internal carotid arteries are patent without significant stenosis (50% or greater), moderate calcified plaque at the carotid bifurcation and within the proximal ICA. Vertebral arteries: The vertebral arteries are codominant. Redemonstrated calcified plaque within the V1 right vertebral artery, just distal to the still origin, with resultant moderate/severe segmental stenosis. Redemonstrated calcified plaque at the origin of the left vertebral artery with suspected moderate ostial stenosis. More distally, the vertebral arteries are patent within the neck Skeleton: No acute bony abnormality. Other neck: No neck mass or cervical lymphadenopathy. Upper chest:  Interstitial thickening and ground-glass opacity within the partially imaged lung apices, nonspecific, but likely reflecting edema. Review of the MIP images confirms the above findings CTA HEAD FINDINGS Anterior circulation: The intracranial internal carotid arteries are patent bilaterally with scattered calcified plaque and no more than mild stenosis. The M1 right middle cerebral artery is patent without significant stenosis. No M2 proximal right MCA branch occlusion is identified. The right anterior cerebral artery is patent without significant proximal stenosis. The M1 left middle cerebral artery is patent without significant stenosis. There is abrupt occlusion of an M2 proximal left MCA branch shortly beyond its origin open (series 10, image 19) (series 12, image 27). The left anterior cerebral artery is patent without significant proximal stenosis. No intracranial aneurysm is identified. Posterior circulation: The intracranial vertebral arteries are patent without significant stenosis, as is the basilar artery. The bilateral posterior cerebral arteries are patent. Foci of mild-to-moderate stenosis within the P2 right posterior cerebral artery. Venous sinuses: Within limitations of contrast timing, no convincing thrombus. Anatomic variants: Posterior communicating arteries are poorly delineated and may be hypoplastic or absent bilaterally. Review of the MIP images confirms the above findings CT Brain Perfusion Findings: CBF (<30%) Volume: 75mL left MCA vascular territory. Perfusion (Tmax>6.0s) volume: 21 mL within the left MCA vascular territory. Mismatch Volume: 7 mL Infarction Location:Left MCA vascular territory. These results were called by telephone at the time of interpretation on 08/17/2019 at 7:44 pm to provider [facility] , who verbally acknowledged these results. IMPRESSION: CTA neck: 1. Bilateral common and internal carotid arteries patent within the neck without significant stenosis (50% or  greater). A stent spanning the distal common carotid artery and proximal cervical right ICA remains patent. Moderate calcified plaque at the left carotid bifurcation and within the proximal left ICA. 2. Calcified plaque within the V1 right vertebral artery with moderate/severe stenosis. Calcified plaque at the origin of the left vertebral artery with suspected moderate ostial stenosis. Findings are unchanged. CTA head: 1. Abrupt occlusion of a proximal M2 left MCA branch vessel shortly beyond its origin. 2. Sites of mild/moderate stenosis within the P2 right posterior cerebral artery. 3. Mild atherosclerotic disease of the intracranial internal carotid arteries. CT perfusion head: The perfusion software identifies a 14 mL core infarct within the left MCA vascular territory. In this same region, 21 mL of critically hypoperfused parenchyma is identified. Reported mismatch volume 7 mL. Reported mismatch ratio 1.5. Electronically Signed   By: Kellie Simmering DO   On: 08/02/2019 20:02   CT Code Stroke Cerebral Perfusion with contrast  Result Date: 08/04/2019 CLINICAL DATA:  Stroke code EXAM: CT ANGIOGRAPHY HEAD AND NECK CT PERFUSION BRAIN TECHNIQUE: Multidetector CT imaging of the head and neck was performed using the standard protocol during bolus administration of intravenous contrast. Multiplanar CT image reconstructions and MIPs were obtained to evaluate the vascular anatomy. Carotid stenosis measurements (when applicable) are  obtained utilizing NASCET criteria, using the distal internal carotid diameter as the denominator. Multiphase CT imaging of the brain was performed following IV bolus contrast injection. Subsequent parametric perfusion maps were calculated using RAPID software. CONTRAST:  133mL OMNIPAQUE IOHEXOL 350 MG/ML SOLN COMPARISON:  Noncontrast noncontrast head CT performed earlier the same day, CT angiogram head/neck 09/28/2018 FINDINGS: CTA NECK FINDINGS Aortic arch: Standard aortic branching. Soft  and calcified plaque within the visualized aortic arch and proximal major branch vessels of the neck. Right carotid system: The common and internal carotid arteries are patent without significant stenosis. A stent traversing the distal common carotid artery and intra stent stenosis. Left carotid system: The common internal carotid arteries are patent without significant stenosis (50% or greater), moderate calcified plaque at the carotid bifurcation and within the proximal ICA. Vertebral arteries: The vertebral arteries are codominant. Redemonstrated calcified plaque within the V1 right vertebral artery, just distal to the still origin, with resultant moderate/severe segmental stenosis. Redemonstrated calcified plaque at the origin of the left vertebral artery with suspected moderate ostial stenosis. More distally, the vertebral arteries are patent within the neck Skeleton: No acute bony abnormality. Other neck: No neck mass or cervical lymphadenopathy. Upper chest: Interstitial thickening and ground-glass opacity within the partially imaged lung apices, nonspecific, but likely reflecting edema. Review of the MIP images confirms the above findings CTA HEAD FINDINGS Anterior circulation: The intracranial internal carotid arteries are patent bilaterally with scattered calcified plaque and no more than mild stenosis. The M1 right middle cerebral artery is patent without significant stenosis. No M2 proximal right MCA branch occlusion is identified. The right anterior cerebral artery is patent without significant proximal stenosis. The M1 left middle cerebral artery is patent without significant stenosis. There is abrupt occlusion of an M2 proximal left MCA branch shortly beyond its origin open (series 10, image 19) (series 12, image 27). The left anterior cerebral artery is patent without significant proximal stenosis. No intracranial aneurysm is identified. Posterior circulation: The intracranial vertebral arteries are  patent without significant stenosis, as is the basilar artery. The bilateral posterior cerebral arteries are patent. Foci of mild-to-moderate stenosis within the P2 right posterior cerebral artery. Venous sinuses: Within limitations of contrast timing, no convincing thrombus. Anatomic variants: Posterior communicating arteries are poorly delineated and may be hypoplastic or absent bilaterally. Review of the MIP images confirms the above findings CT Brain Perfusion Findings: CBF (<30%) Volume: 90mL left MCA vascular territory. Perfusion (Tmax>6.0s) volume: 21 mL within the left MCA vascular territory. Mismatch Volume: 7 mL Infarction Location:Left MCA vascular territory. These results were called by telephone at the time of interpretation on 08/17/2019 at 7:44 pm to provider [facility] , who verbally acknowledged these results. IMPRESSION: CTA neck: 1. Bilateral common and internal carotid arteries patent within the neck without significant stenosis (50% or greater). A stent spanning the distal common carotid artery and proximal cervical right ICA remains patent. Moderate calcified plaque at the left carotid bifurcation and within the proximal left ICA. 2. Calcified plaque within the V1 right vertebral artery with moderate/severe stenosis. Calcified plaque at the origin of the left vertebral artery with suspected moderate ostial stenosis. Findings are unchanged. CTA head: 1. Abrupt occlusion of a proximal M2 left MCA branch vessel shortly beyond its origin. 2. Sites of mild/moderate stenosis within the P2 right posterior cerebral artery. 3. Mild atherosclerotic disease of the intracranial internal carotid arteries. CT perfusion head: The perfusion software identifies a 14 mL core infarct within the left MCA vascular territory.  In this same region, 21 mL of critically hypoperfused parenchyma is identified. Reported mismatch volume 7 mL. Reported mismatch ratio 1.5. Electronically Signed   By: Kellie Simmering DO   On:  08/04/2019 20:02   CT HEAD CODE STROKE WO CONTRAST  Result Date: 07/31/2019 CLINICAL DATA:  Code stroke. Focal neuro deficit, greater than 6 hours, stroke suspected. Left gaze, slurred speech, last known normal 4 p.m. EXAM: CT HEAD WITHOUT CONTRAST TECHNIQUE: Contiguous axial images were obtained from the base of the skull through the vertex without intravenous contrast. COMPARISON:  Head CT 05/29/2019 FINDINGS: Brain: No evidence of acute intracranial hemorrhage or acute demarcated cortical infarction. Redemonstrated remote cortically based infarcts within the right frontal lobe. Background of advanced chronic small vessel ischemic disease. Redemonstrated chronic lacunar infarcts within the cerebral white matter as well as bilateral basal ganglia and cerebellum. No evidence of intracranial mass. No midline shift or extra-axial fluid collection. Mild generalized parenchymal atrophy. Vascular: No definite hyperdense vessel. Atherosclerotic calcifications. Right ICA stent within the partially imaged neck. Skull: Normal. Negative for fracture or focal lesion. Sinuses/Orbits: Visualized orbits demonstrate no acute abnormality. No significant paranasal sinus disease or mastoid effusion at the imaged levels. These results were communicated to Dr. Rory Percy At 7:19 pmon 12/10/2020by text page via the [facility] messaging system. IMPRESSION: No evidence of acute intracranial hemorrhage or acute demarcated cortical infarction. Redemonstrated chronic cortically based infarcts within the right frontal lobe. Background of advanced chronic small vessel ischemic disease. Redemonstrated chronic lacunar infarcts within the cerebral white matter as well as bilateral basal ganglia and cerebellum. Electronically Signed   By: Kellie Simmering DO   On: 08/13/2019 19:21    Procedures .Critical Care Performed by: Gareth Morgan, MD Authorized by: Gareth Morgan, MD   Critical care provider statement:    Critical care time (minutes):   30   Critical care was necessary to treat or prevent imminent or life-threatening deterioration of the following conditions:  CNS failure or compromise   Critical care was time spent personally by me on the following activities:  Discussions with consultants, evaluation of patient's response to treatment, examination of patient, ordering and performing treatments and interventions, ordering and review of laboratory studies, ordering and review of radiographic studies, pulse oximetry, re-evaluation of patient's condition, obtaining history from patient or surrogate and review of old charts   (including critical care time)  Medications Ordered in ED Medications  potassium chloride 10 mEq in 100 mL IVPB (10 mEq Intravenous Not Given 2019/08/17 0502)  labetalol (NORMODYNE) 5 MG/ML injection (20 mg  Given 08/04/2019 1915)  iohexol (OMNIPAQUE) 350 MG/ML injection 100 mL (100 mLs Intravenous Contrast Given 08/20/2019 1925)  labetalol (NORMODYNE) injection (20 mg Intravenous Given 08/02/2019 1914)  clevidipine (CLEVIPREX) infusion 0.5 mg/mL ( Intravenous Stopped 08/08/19 1947)   stroke: mapping our early stages of recovery book ( Does not apply Given 08/17/2019 0227)    ED Course  I have reviewed the triage vital signs and the nursing notes.  Pertinent labs & imaging results that were available during my care of the patient were reviewed by me and considered in my medical decision making (see chart for details).    MDM Rules/Calculators/A&P                      85yo female with history of right MCA stroke, right ICA stent, leukemia, anemia, thrombocytopenia, CAD, htn, ICH, hypothyroidism, peripheral vascular disase, RA, presented as Code Stroke for right sided weakness, aphasia and leftward  gaze LNW 4PM.  Neurology at bedside on patient's arrival.  She is protecting airway but unable to fully participate in exam or history on my evaluation.  Exam and hx consistent with LVO CVA. No sign of ICH on CT. Initial  BP 220s and given thought of possible tPA was given labetalol/cleviprex ordered.  Dr. Rory Percy discussed patient's presentation and care with family. She has hx of thrombocytopenia, today is 109, he discussed possibility of tPA, however after discussion they declined.  She is not a candidate for IR given modified Rankin. Plan to admit for continued CVA work up.    Final Clinical Impression(s) / ED Diagnoses Final diagnoses:  Cerebrovascular accident (CVA) due to embolism of left middle cerebral artery [facility])    Rx / DC Orders ED Discharge Orders    None       Gareth Morgan, MD August 12, 2019 1429

## 2019-08-08 NOTE — Consult Note (Signed)
Neurology Consultation  Reason for Consult: Code stroke-[facility] Referring Physician: Dr. Gareth Morgan  CC: Sudden onset left-sided gaze and right-sided neglect along with aphasia.  History is obtained from: Chart, son  HPI: Stephanie Harris is a 85 y.o. female past medical history of right MCA stroke, right ICA stent,, leukemia, anemia, thrombocytopenia, coronary artery disease, hypertension, prior history of ICH-unclear if hemorrhagic conversion of an ischemic stroke or a primary ICH, hypothyroidism, peripheral vascular disease, rheumatoid arthritis, presented to the emergency room for sudden onset of right-sided weakness, aphasia leftward gaze with last known normal at 4 PM. At baseline, uses a walker to ambulate in the house and does not walk much outside the house, but when does it needs walker or wheelchair.  Lives at home with family. Noted by family to have the symptoms around 4 PM. A month or so ago, had a bad UTI, which made her more weak and unable to perform all ADLs independently. Brought in as an acute code stroke by [facility] due to the sudden onset of focal neurological deficits. Evaluated by neurology team, noted to be LVO positive but LVO activation for IR held due to poor baseline modified Rankin. Further imaging obtained that showed a left M2 occlusion, with a very small penumbra-14 cc core and 21 cc penumbra with a 7 cc mismatch..  Lab delay in obtaining platelet count given the chronic thrombocytopenia-precluded immediate TPA administration. Finally the platelets resulted 109,000.  Discussed with son the risks and benefits of using IV TPA with the borderline platelet count and this time decided not to pursue TPA at this time. Not a candidate for IR due to poor modified Rankin at baseline   LKW: 40 p.m. on 08/11/2019 tpa given?: no, borderline thrombocytopenic, prior history of ICH Premorbid modified Rankin scale (mRS): 4  ROS: Unable to obtain due to aphasia.    Past Medical History:  Diagnosis Date  . Acute bronchiolitis   . Acute pharyngitis   . Allergic rhinitis due to pollen   . Anemia, unspecified    CKD and LGL  . Anxiety state    unspecified  . Arthritis    "knees, legs" (03/18/2018)  . Asthma without status asthmaticus    unspecified  . B12 deficiency   . Beta thalassemia trait   . Carotid artery occlusion   . Cellulitis and abscess of leg, except foot   . Cervical spondylosis   . Cervicalgia   . Chronic kidney disease   . Chronic lower back pain   . CML (chronic myelocytic leukemia) ([place])    Onc at [facility]  . Complication of anesthesia    "last back surgery they liked to never get me awake" (03/18/2018)  . Coronary artery disease 2009   heart attack with stent  . Coronary atherosclerosis of native coronary artery   . Depressive disorder    not elsewhere classified  . Difficulty in walking   . Disorder of breast, unspecified   . Dizziness and giddiness   . Dysuria   . Esophageal reflux   . Essential hypertension    unspecified  . Family history of adverse reaction to anesthesia    "liked to never get my sister awake" (03/18/2018)  . Head injury    unspecified  . Headache    "q time I have a stroke" (03/18/2018)  . Heart disease   . Hematuria, unspecified   . History of blood transfusion    "had 22 when they found out I had leukemia" (  03/18/2018)  . Hypersomnia, unspecified   . Hypertension   . Hypopotassemia   . Hypothyroidism   . ICH (intracerebral hemorrhage) ([place])   . Ill-defined cerebrovascular disease    other  . Insomnia    unspecified  . 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 Leukemia ([place])   . Lower urinary tract infection   . Lumbago   . Mini stroke ([place])    x years  . Mixed hyperlipidemia   . Myocardial infarction ([place]) ~2017  . Nontoxic nodular goiter    unspecified  . Occlusion and stenosis of unspecified carotid artery    without mention of cerebral infarction  . Osteoarthritis    . Osteoporosis   . Other constipation   . Other vitamin B12 deficiency anemias   . Otitis media, unspecified, unspecified ear   . Ovarian failure    unspecified  . Pain in limb   . Panic disorder without agoraphobia   . Peripheral vascular disease ([place])    unspecified  . Phlebitis of left arm   . RA (rheumatoid arthritis) ([place])   . Sigmoid polyp 1998  . Sleep disturbance    unspecified  . Stroke ([place]) 02/2017   "lots of mini strokes; big one 02/2017; that one made me weak in my knees,; never fully recovered" (03/18/2018)  . Syncope and collapse   . Thalassemia   . TIA (transient ischemic attack)    2000 and 2008 right carotid stent 08/14/2009 on Plavix  . TIA (transient ischemic attack) 03/18/2018  . Transient disorder of initiating or maintaining sleep   . Type II diabetes mellitus ([place])   . Varicose veins of bilateral lower extremities with other complications   . Varicose veins of bilateral lower extremities with other complications   . Vision abnormalities     Family History  Problem Relation Age of Onset  . Acute myelogenous leukemia Brother   . Anemia Brother   . Coronary artery disease Brother   . Heart disease Brother   . Stroke Brother   . Heart failure Brother   . Stroke Mother   . Anemia Mother   . Heart disease Mother   . Heart failure Mother   . Hypertension Mother   . Osteoarthritis Mother   . Rheum arthritis Mother   . Heart attack Father   . Anemia Sister   . Cataracts Sister   . Osteoarthritis Sister   . Rheum arthritis Sister   . Stroke Sister   . Heart disease Sister   . Thalassemia Sister   . Thalassemia Sister   . Blindness Neg Hx   . Glaucoma Neg Hx   . Macular degeneration Neg Hx   . Strabismus Neg Hx   . Vision loss Neg Hx   . Basal cell carcinoma Neg Hx   . GU problems Neg Hx   . Kidney cancer Neg Hx   . Melanoma Neg Hx   . Kidney disease Neg Hx   . Prostate cancer Neg Hx   . Squamous cell carcinoma Neg Hx   Social History:    reports that she has never smoked. She has never used smokeless tobacco. She reports that she does not drink alcohol or use drugs.  Medications  Current Facility-Administered Medications:  .  clevidipine (CLEVIPREX) 0.5 MG/ML infusion, , , ,  .  clevidipine (CLEVIPREX) infusion 0.5 mg/mL, 0-21 mg/hr, Intravenous, Continuous, Amie Portland, MD .  labetalol (NORMODYNE) 5 MG/ML injection, , , ,   Current Outpatient Medications:  .  aspirin 81 MG chewable tablet, Chew 81 mg by mouth daily., Disp: , Rfl:  .  carvedilol (COREG) 12.5 MG tablet, Take 1 tablet (12.5 mg total) by mouth 2 (two) times daily with a meal., Disp: 60 tablet, Rfl: 0 .  cyanocobalamin (,VITAMIN B-12,) 1000 MCG/ML injection, INJECT 1ML EVERY MONTH AS DIRECTED, Disp: 1 mL, Rfl: 12 .  levothyroxine (SYNTHROID) 50 MCG tablet, Take 1 tablet (50 mcg total) by mouth daily. Name brand is medically necessary., Disp: 90 tablet, Rfl: 1 .  Multiple Vitamin (MULTIVITAMIN WITH MINERALS) TABS tablet, Take 1 tablet by mouth daily., Disp: 30 tablet, Rfl: 0 .  omeprazole (PRILOSEC) 20 MG capsule, Take 1 capsule (20 mg total) by mouth daily., Disp: 30 capsule, Rfl: 2 .  ondansetron (ZOFRAN ODT) 4 MG disintegrating tablet, Take 1 tablet (4 mg total) by mouth every 8 (eight) hours as needed for nausea or vomiting., Disp: 10 tablet, Rfl: 0  Exam: Current vital signs: BP (!) 167/64   Pulse 70   Temp 98.6 F (37 C) (Rectal)   Resp 17   Ht 5' (1.524 m)   Wt 36.8 kg   SpO2 96%   BMI 15.84 kg/m  Vital signs in last 24 hours: Temp:  [98.6 F (37 C)] 98.6 F (37 C) (12/15 2001) Pulse Rate:  [70-72] 70 (12/15 2006) Resp:  [15-19] 17 (12/15 2006) BP: (137-201)/(49-99) 167/64 (12/15 2006) SpO2:  [92 %-96 %] 96 % (12/15 2006) Weight:  [36.8 kg] 36.8 kg (12/15 1800) General: Awake alert in no distress HEENT: Normocephalic atraumatic dry mucous membranes Lungs: Clear to auscultation Cardiovascular: Regular rate rhythm Abdomen: Soft  nondistended nontender Extremities: Warm well perfused with multiple areas of subcutaneous bruising. Neurological exam Awake alert. Aphasic Some grimacing with noxious stimulation but no other meaningful verbalization. Cranial nerves: Pupils equal round reactive to light, extraocular movement exam reveals a forced left gaze deviation, does not blink to threat from either side, right lower facial paralysis Motor exam: Flaccid right upper extremity, some movement to noxious stimulation in the right lower extremity.  Left upper and left lower extremity seem nearly full strength but unable to hold antigravity 10 and 5 seconds and showed left, likely secondary to poor attention concentration. Sensory exam: Decreased on the right Coordination, difficult to assess. Gait testing deferred at this time NIH stroke scale 24   Labs I have reviewed labs in epic and the results pertinent to this consultation are:  CBC    Component Value Date/Time   WBC 6.6 05/30/2019 0455   RBC 5.28 (H) 05/30/2019 0455   HGB 10.9 (L) 08/01/2019 1914   HGB 9.6 (L) 11/24/2013 0539   HCT 32.0 (L) 08/22/2019 1914   HCT 30.8 (L) 11/24/2013 0539   PLT 100 (L) 05/30/2019 0455   PLT 109 (L) 11/24/2013 0539   MCV 61.2 (L) 05/30/2019 0455   MCV 63 (L) 11/24/2013 0539   MCH 18.4 (L) 05/30/2019 0455   MCHC 30.0 05/30/2019 0455   RDW 18.0 (H) 05/30/2019 0455   RDW 18.4 (H) 11/24/2013 0539   LYMPHSABS 2.2 05/29/2019 1242   LYMPHSABS 2.9 11/24/2013 0539   MONOABS 0.9 05/29/2019 1242   MONOABS 1.0 (H) 11/24/2013 0539   EOSABS 0.1 05/29/2019 1242   EOSABS 0.1 11/24/2013 0539   BASOSABS 0.0 05/29/2019 1242   BASOSABS 0.0 11/24/2013 0539   CMP     Component Value Date/Time   NA 143 08/07/2019 1914   NA 138 11/24/2013 0539   K 3.5 08/24/2019 1914  K 3.6 11/24/2013 0539   CL 106 08/13/2019 1914   CL 105 11/24/2013 0539   CO2 26 05/31/2019 0547   CO2 25 11/24/2013 0539   GLUCOSE 109 (H) 08/18/2019 1914   GLUCOSE  98 11/24/2013 0539   BUN 17 07/27/2019 1914   BUN 18 11/24/2013 0539   CREATININE 1.30 (H) 08/19/2019 1914   CREATININE 1.32 (H) 11/24/2013 0539   CALCIUM 9.3 05/31/2019 0547   CALCIUM 8.9 11/24/2013 0539   PROT 7.5 05/29/2019 1242   PROT 7.6 11/23/2013 1728   ALBUMIN 4.2 05/29/2019 1242   ALBUMIN 3.6 11/23/2013 1728   AST 29 05/29/2019 1242   AST 32 11/23/2013 1728   ALT 11 05/29/2019 1242   ALT 29 11/23/2013 1728   ALKPHOS 25 (L) 05/29/2019 1242   ALKPHOS 40 (L) 11/23/2013 1728   BILITOT 1.0 05/31/2019 0547   BILITOT 1.2 (H) 11/23/2013 1728   GFRNONAA 45 (L) 05/31/2019 0547   GFRNONAA 38 (L) 11/24/2013 0539   GFRAA 52 (L) 05/31/2019 0547   GFRAA 44 (L) 11/24/2013 0539    Imaging I have reviewed the images obtained:  CT-scan of the brain-chronic white matter disease.  No bleed  CTA head and neck showed bilateral common and internal carotid arteries patent without significant stenosis.  Stent in the distal common carotid and proximal right cervical ICA remains patent.  Moderate calcified plaque at the left carotid bifurcation and within the proximal left ICA.  Calcified plaque within the V1 right vertebral artery with moderate to severe stenosis.  Calcified plaque at the origin of left vertebral artery with suspected moderate ostial stenosis-findings unchanged from prior scan. CTA of the head showed an abrupt occlusion of the proximal M2 left MCA branch shortly after bifurcation.  Mild to moderate stenosis within the right P2 segment of the posterior cerebral artery. CT perfusion-14 mL of core infarct in the left MCA with 21 cc of penumbra-mismatch of 7 cc.  Assessment: 85 year old with above past medical history with sudden onset of right-sided weakness and aphasia likely secondary to a left MCA stroke. Only 7 cc of a salvageable tissue on CT perfusion, poor baseline modified Rankin score-precluded IR intervention. Chronic thrombocytopenia and prior history of ICH versus  hemorrhagic transformation of ischemic stroke precluded IV TPA administration after discussion with the family in detail about risks and benefits. Recommend admission for stroke work-up.  Impression: Left MCA stroke  Recommendations: Admit to hospitalist Telemetry Frequent rechecks Aspirin 325 Atorvastatin 80 2D echocardiogram MRI brain without contrast A1c Lipid panel PT OT Speech therapy N.p.o. until cleared by stroke swallow screen or formal swallow evaluation. Stroke team will follow with you. Discussed with the ED provider Dr. Billy Fischer in detail. Discussed with the son, Mr. Leonides Schanz over the phone-updated him multiple times during the course of the code stroke on imaging findings and discussions regarding treatment made with his consent and approval.   -- Amie Portland, MD [facility] Pager: [phone] If 7pm to 7am, please call on call as listed on AMION.  CRITICAL CARE ATTESTATION Performed by: Amie Portland, MD Total critical care time: 60 minutes Critical care time was exclusive of separately billable procedures and treating other patients and/or supervising APPs/Residents/Students Critical care was necessary to treat or prevent imminent or life-threatening deterioration due to acute ischemic stroke This patient is critically ill and at significant risk for neurological worsening and/or death and care requires constant monitoring. Critical care was time spent personally by me on the following activities: development of treatment plan  with patient and/or surrogate as well as nursing, discussions with consultants, evaluation of patient's response to treatment, examination of patient, obtaining history from patient or surrogate, ordering and performing treatments and interventions, ordering and review of laboratory studies, ordering and review of radiographic studies, pulse oximetry, re-evaluation of patient's condition, participation in multidisciplinary rounds  and medical decision making of high complexity in the care of this patient.

## 2019-08-08 NOTE — ED Notes (Signed)
Cleviprex stopped; bp management if above [id number] systolic

## 2019-08-08 NOTE — H&P (Addendum)
History and Physical    Stephanie Harris [id number] DOB: [birth year] DOA: 08/20/2019  PCP: [name], MD Patient coming from: Home  Chief Complaint: Sudden onset left-sided gaze and right-sided neglect along with aphasia  HPI: Stephanie Harris is a 85 y.o. female with medical history significant of right MCA stroke, right ICA stent, CML, anemia, thrombocytopenia, asthma, CKD, CAD, hypertension, prior history of ICH, hypothyroidism, peripheral vascular disease, rheumatoid arthritis presenting to the ED for evaluation of sudden onset right-sided weakness, aphasia, and leftward gaze.  Last known normal at 4 PM.  She was seen by neurology.  Neurology had a discussion with the patient's son and decision was made not to give TPA due to chronic thrombocytopenia and prior history of ICH versus hemorrhagic transformation of ischemic stroke.  Not a candidate for IR due to poor modified [place] at baseline.  Patient is nonverbal and no history could be obtained from her.    ED Course: Blood pressure elevated.  CT head negative for acute intracranial hemorrhage or infarct.   CTA head showing abrupt occlusion of a proximal M2 left MCA branch vessel shortly beyond its origin. Sites of mild/moderate stenosis within the P2 right posterior cerebral artery. CTA neck showing bilateral common and internal carotid arteries patent within the neck without significant stenosis (50% or greater). A stent spanning the distal common carotid artery and proximal cervical right ICA remains patent. Moderate calcified plaque at the left carotid bifurcation and within the proximal left ICA. Calcified plaque within the V1 right vertebral artery with moderate/severe stenosis. Calcified plaque at the origin of the left vertebral artery with suspected moderate ostial stenosis. Findings are unchanged. CTA perfusion study showing a 14 mL core infarct within the left MCA vascular territory. In this same region, 21 mL of critically  hypoperfused parenchyma is identified. Reported mismatch volume 7 mL. Reported mismatch ratio 1.5. Patient received labetalol and clevidipine.  After decision was made not to give TPA, clevidipine infusion was stopped.  Review of Systems:  All systems reviewed and apart from history of presenting illness, are negative.  Past Medical History:  Diagnosis Date  . Acute bronchiolitis   . Acute pharyngitis   . Allergic rhinitis due to pollen   . Anemia, unspecified    CKD and LGL  . Anxiety state    unspecified  . Arthritis    "knees, legs" (03/18/2018)  . Asthma without status asthmaticus    unspecified  . B12 deficiency   . Beta thalassemia trait   . Carotid artery occlusion   . Cellulitis and abscess of leg, except foot   . Cervical spondylosis   . Cervicalgia   . Chronic kidney disease   . Chronic lower back pain   . CML (chronic myelocytic leukemia) ([place])    Onc at [facility]  . Complication of anesthesia    "last back surgery they liked to never get me awake" (03/18/2018)  . Coronary artery disease 2009   heart attack with stent  . Coronary atherosclerosis of native coronary artery   . Depressive disorder    not elsewhere classified  . Difficulty in walking   . Disorder of breast, unspecified   . Dizziness and giddiness   . Dysuria   . Esophageal reflux   . Essential hypertension    unspecified  . Family history of adverse reaction to anesthesia    "liked to never get my sister awake" (03/18/2018)  . Head injury    unspecified  . Headache    "  q time I have a stroke" (03/18/2018)  . Heart disease   . Hematuria, unspecified   . History of blood transfusion    "had 22 when they found out I had leukemia" (03/18/2018)  . Hypersomnia, unspecified   . Hypertension   . Hypopotassemia   . Hypothyroidism   . ICH (intracerebral hemorrhage) ([place])   . Ill-defined cerebrovascular disease    other  . Insomnia    unspecified  . 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  Leukemia ([place])   . Lower urinary tract infection   . Lumbago   . Mini stroke ([place])    x years  . Mixed hyperlipidemia   . Myocardial infarction ([place]) ~2017  . Nontoxic nodular goiter    unspecified  . Occlusion and stenosis of unspecified carotid artery    without mention of cerebral infarction  . Osteoarthritis   . Osteoporosis   . Other constipation   . Other vitamin B12 deficiency anemias   . Otitis media, unspecified, unspecified ear   . Ovarian failure    unspecified  . Pain in limb   . Panic disorder without agoraphobia   . Peripheral vascular disease ([place])    unspecified  . Phlebitis of left arm   . RA (rheumatoid arthritis) ([place])   . Sigmoid polyp 1998  . Sleep disturbance    unspecified  . Stroke ([place]) 02/2017   "lots of mini strokes; big one 02/2017; that one made me weak in my knees,; never fully recovered" (03/18/2018)  . Syncope and collapse   . Thalassemia   . TIA (transient ischemic attack)    2000 and 2008 right carotid stent 08/14/2009 on Plavix  . TIA (transient ischemic attack) 03/18/2018  . Transient disorder of initiating or maintaining sleep   . Type II diabetes mellitus ([place])   . Varicose veins of bilateral lower extremities with other complications   . Varicose veins of bilateral lower extremities with other complications   . Vision abnormalities     Past Surgical History:  Procedure Laterality Date  . BACK SURGERY    . CAROTID ENDARTERECTOMY Left   . CAROTID STENT INSERTION Right    "have 2 stents in there" (03/18/2018)  . CATARACT EXTRACTION W/ INTRAOCULAR LENS  IMPLANT, BILATERAL Bilateral 06/16/2001 - 05/28/2003   +23.5D     22.5D  . CHOLECYSTECTOMY OPEN  1980  . COLONOSCOPY  2010  . CORONARY ANGIOPLASTY WITH STENT PLACEMENT    . EYELID SURGERY Bilateral 2005   BUL BLEPH  . FOOT SURGERY  05/13/2019  . FRACTURE SURGERY    . JOINT REPLACEMENT    . KYPHOPLASTY N/A 08/12/2017   Procedure: KYPHOPLASTY;  Surgeon: Hessie Knows, MD;   Location: [facility] ORS;  Service: Orthopedics;  Laterality: N/A;  . KYPHOPLASTY N/A 11/08/2017   Procedure: Hewitt Shorts;  Surgeon: Hessie Knows, MD;  Location: [facility] ORS;  Service: Orthopedics;  Laterality: N/A;  . PERCUTANEOUS PLACEMENT INTRAVASCULAR STENT CERVICAL CAROTID ARTERY  06/14/2009  . ROTATOR CUFF REPAIR Left   . TOTAL ABDOMINAL HYSTERECTOMY  1978   WITH REMOVAL TUBES & /OR OVARIES  . TOTAL KNEE ARTHROPLASTY Right      reports that she has never smoked. She has never used smokeless tobacco. She reports that she does not drink alcohol or use drugs.  Allergies  Allergen Reactions  . Latex Rash  . Meperidine     Other reaction(s): Other (See Comments) Other Reaction: CNS Disorder  . Penicillins Other (See Comments)  Has patient had a PCN reaction causing immediate rash, facial/tongue/throat swelling, SOB or lightheadedness with hypotension: Unknown Has patient had a PCN reaction causing severe rash involving mucus membranes or skin necrosis: Unknown Has patient had a PCN reaction that required hospitalization: Unknown Has patient had a PCN reaction occurring within the last 10 years: Unknown If all of the above answers are "NO", then may proceed with Cephalosporin use.   . Cefuroxime Axetil Nausea And Vomiting  . Codeine Nausea And Vomiting and Nausea Only  . Propoxyphene Nausea Only    Other reaction(s): Vomiting    Family History  Problem Relation Age of Onset  . Acute myelogenous leukemia Brother   . Anemia Brother   . Coronary artery disease Brother   . Heart disease Brother   . Stroke Brother   . Heart failure Brother   . Stroke Mother   . Anemia Mother   . Heart disease Mother   . Heart failure Mother   . Hypertension Mother   . Osteoarthritis Mother   . Rheum arthritis Mother   . Heart attack Father   . Anemia Sister   . Cataracts Sister   . Osteoarthritis Sister   . Rheum arthritis Sister   . Stroke Sister   . Heart disease Sister   . Thalassemia  Sister   . Thalassemia Sister   . Blindness Neg Hx   . Glaucoma Neg Hx   . Macular degeneration Neg Hx   . Strabismus Neg Hx   . Vision loss Neg Hx   . Basal cell carcinoma Neg Hx   . GU problems Neg Hx   . Kidney cancer Neg Hx   . Melanoma Neg Hx   . Kidney disease Neg Hx   . Prostate cancer Neg Hx   . Squamous cell carcinoma Neg Hx     Prior to Admission medications   Medication Sig Start Date End Date Taking? Authorizing Provider  aspirin 81 MG chewable tablet Chew 81 mg by mouth daily.    [provider]  carvedilol (COREG) 12.5 MG tablet Take 1 tablet (12.5 mg total) by mouth 2 (two) times daily with a meal. 05/31/19   Vaughan Basta, MD  cyanocobalamin (,VITAMIN B-12,) 1000 MCG/ML injection INJECT 1ML EVERY MONTH AS DIRECTED 07/28/19   [name], MD  levothyroxine (SYNTHROID) 50 MCG tablet Take 1 tablet (50 mcg total) by mouth daily. Name brand is medically necessary. 07/24/19   Ronnell Freshwater, NP  Multiple Vitamin (MULTIVITAMIN WITH MINERALS) TABS tablet Take 1 tablet by mouth daily. 06/01/19   Vaughan Basta, MD  omeprazole (PRILOSEC) 20 MG capsule Take 1 capsule (20 mg total) by mouth daily. 07/19/19   Ronnell Freshwater, NP  ondansetron (ZOFRAN ODT) 4 MG disintegrating tablet Take 1 tablet (4 mg total) by mouth every 8 (eight) hours as needed for nausea or vomiting. 06/28/19   Ronnell Freshwater, NP    Physical Exam: Vitals:   08/20/2019 2245 08/21/2019 2300 08/13/2019 2315 08/11/2019 2330  BP:  (!) 210/77  (!) 201/84  Pulse: 77 78 73 83  Resp: 18 20 (!) 22 (!) 23  Temp:      TempSrc:      SpO2: 97% 97% 97% 95%  Weight:      Height:        Physical Exam  Constitutional: No distress.  HENT:  Head: Normocephalic.  Eyes: Pupils are equal, round, and reactive to light. Right eye exhibits no discharge. Left eye exhibits no  discharge.  Leftward gaze deviation  Cardiovascular: Normal rate, regular rhythm and intact distal pulses.  Pulmonary/Chest:  Effort normal and breath sounds normal. No respiratory distress. She has no wheezes. She has no rales.  Abdominal: Soft. Bowel sounds are normal. She exhibits no distension. There is no abdominal tenderness. There is no guarding.  Musculoskeletal:        General: No edema.     Cervical back: Neck supple.  Neurological:  Awake and alert Nonverbal Able to slightly move left upper and lower extremities on command. Not moving right upper and lower extremities on command.  Skin: Skin is warm and dry. She is not diaphoretic.     Labs on Admission: I have personally reviewed following labs and imaging studies  CBC: Recent Labs  Lab 08/04/2019 1908 08/19/2019 1914  WBC 6.2  --   NEUTROABS 2.0  --   HGB 9.2* 10.9*  HCT 31.5* 32.0*  MCV 63.9*  --   PLT 109*  --    Basic Metabolic Panel: Recent Labs  Lab 07/27/2019 1908 08/18/2019 1914  NA 142 143  K 3.4* 3.5  CL 108 106  CO2 23  --   GLUCOSE 116* 109*  BUN 16 17  CREATININE 1.37* 1.30*  CALCIUM 9.2  --    GFR: Estimated Creatinine Clearance: 18 mL/min (A) (by C-G formula based on SCr of 1.3 mg/dL (H)). Liver Function Tests: Recent Labs  Lab 08/18/2019 1908  AST 24  ALT 13  ALKPHOS 22*  BILITOT 1.1  PROT 6.4*  ALBUMIN 3.5   No results for input(s): LIPASE, AMYLASE in the last 168 hours. No results for input(s): AMMONIA in the last 168 hours. Coagulation Profile: Recent Labs  Lab 07/27/2019 1908  INR 1.1   Cardiac Enzymes: No results for input(s): CKTOTAL, CKMB, CKMBINDEX, TROPONINI in the last 168 hours. BNP (last 3 results) No results for input(s): PROBNP in the last 8760 hours. HbA1C: No results for input(s): HGBA1C in the last 72 hours. CBG: Recent Labs  Lab 08/12/2019 2138  GLUCAP 122*   Lipid Profile: No results for input(s): CHOL, HDL, LDLCALC, TRIG, CHOLHDL, LDLDIRECT in the last 72 hours. Thyroid Function Tests: No results for input(s): TSH, T4TOTAL, FREET4, T3FREE, THYROIDAB in the last 72 hours. Anemia  Panel: No results for input(s): VITAMINB12, FOLATE, FERRITIN, TIBC, IRON, RETICCTPCT in the last 72 hours. Urine analysis:    Component Value Date/Time   COLORURINE YELLOW 08/17/2019 2011   APPEARANCEUR CLEAR 08/20/2019 2011   APPEARANCEUR Cloudy 11/23/2013 1958   LABSPEC 1.024 08/12/2019 2011   LABSPEC 1.016 11/23/2013 1958   PHURINE 6.0 08/03/2019 2011   GLUCOSEU NEGATIVE 07/30/2019 2011   GLUCOSEU Negative 11/23/2013 1958   HGBUR SMALL (A) 08/18/2019 2011   BILIRUBINUR NEGATIVE 08/03/2019 2011   BILIRUBINUR negative 05/16/2019 1203   BILIRUBINUR Negative 11/23/2013 [place] NEGATIVE 08/02/2019 2011   PROTEINUR NEGATIVE 08/14/2019 2011   UROBILINOGEN 0.2 05/16/2019 1203   NITRITE NEGATIVE 08/21/2019 2011   LEUKOCYTESUR NEGATIVE 08/10/2019 2011   LEUKOCYTESUR 3+ 11/23/2013 1958    Radiological Exams on Admission: CT Code Stroke CTA Head W/WO contrast  Result Date: 08/08/2019 CLINICAL DATA:  Stroke code EXAM: CT ANGIOGRAPHY HEAD AND NECK CT PERFUSION BRAIN TECHNIQUE: Multidetector CT imaging of the head and neck was performed using the standard protocol during bolus administration of intravenous contrast. Multiplanar CT image reconstructions and MIPs were obtained to evaluate the vascular anatomy. Carotid stenosis measurements (when applicable) are obtained utilizing NASCET criteria, using  the distal internal carotid diameter as the denominator. Multiphase CT imaging of the brain was performed following IV bolus contrast injection. Subsequent parametric perfusion maps were calculated using RAPID software. CONTRAST:  166mL OMNIPAQUE IOHEXOL 350 MG/ML SOLN COMPARISON:  Noncontrast noncontrast head CT performed earlier the same day, CT angiogram head/neck 09/28/2018 FINDINGS: CTA NECK FINDINGS Aortic arch: Standard aortic branching. Soft and calcified plaque within the visualized aortic arch and proximal major branch vessels of the neck. Right carotid system: The common and internal  carotid arteries are patent without significant stenosis. A stent traversing the distal common carotid artery and intra stent stenosis. Left carotid system: The common internal carotid arteries are patent without significant stenosis (50% or greater), moderate calcified plaque at the carotid bifurcation and within the proximal ICA. Vertebral arteries: The vertebral arteries are codominant. Redemonstrated calcified plaque within the V1 right vertebral artery, just distal to the still origin, with resultant moderate/severe segmental stenosis. Redemonstrated calcified plaque at the origin of the left vertebral artery with suspected moderate ostial stenosis. More distally, the vertebral arteries are patent within the neck Skeleton: No acute bony abnormality. Other neck: No neck mass or cervical lymphadenopathy. Upper chest: Interstitial thickening and ground-glass opacity within the partially imaged lung apices, nonspecific, but likely reflecting edema. Review of the MIP images confirms the above findings CTA HEAD FINDINGS Anterior circulation: The intracranial internal carotid arteries are patent bilaterally with scattered calcified plaque and no more than mild stenosis. The M1 right middle cerebral artery is patent without significant stenosis. No M2 proximal right MCA branch occlusion is identified. The right anterior cerebral artery is patent without significant proximal stenosis. The M1 left middle cerebral artery is patent without significant stenosis. There is abrupt occlusion of an M2 proximal left MCA branch shortly beyond its origin open (series 10, image 19) (series 12, image 27). The left anterior cerebral artery is patent without significant proximal stenosis. No intracranial aneurysm is identified. Posterior circulation: The intracranial vertebral arteries are patent without significant stenosis, as is the basilar artery. The bilateral posterior cerebral arteries are patent. Foci of mild-to-moderate  stenosis within the P2 right posterior cerebral artery. Venous sinuses: Within limitations of contrast timing, no convincing thrombus. Anatomic variants: Posterior communicating arteries are poorly delineated and may be hypoplastic or absent bilaterally. Review of the MIP images confirms the above findings CT Brain Perfusion Findings: CBF (<30%) Volume: 67mL left MCA vascular territory. Perfusion (Tmax>6.0s) volume: 21 mL within the left MCA vascular territory. Mismatch Volume: 7 mL Infarction Location:Left MCA vascular territory. These results were called by telephone at the time of interpretation on 08/22/2019 at 7:44 pm to provider [place] , who verbally acknowledged these results. IMPRESSION: CTA neck: 1. Bilateral common and internal carotid arteries patent within the neck without significant stenosis (50% or greater). A stent spanning the distal common carotid artery and proximal cervical right ICA remains patent. Moderate calcified plaque at the left carotid bifurcation and within the proximal left ICA. 2. Calcified plaque within the V1 right vertebral artery with moderate/severe stenosis. Calcified plaque at the origin of the left vertebral artery with suspected moderate ostial stenosis. Findings are unchanged. CTA head: 1. Abrupt occlusion of a proximal M2 left MCA branch vessel shortly beyond its origin. 2. Sites of mild/moderate stenosis within the P2 right posterior cerebral artery. 3. Mild atherosclerotic disease of the intracranial internal carotid arteries. CT perfusion head: The perfusion software identifies a 14 mL core infarct within the left MCA vascular territory. In this same region, 54  mL of critically hypoperfused parenchyma is identified. Reported mismatch volume 7 mL. Reported mismatch ratio 1.5. Electronically Signed   By: Kellie Simmering DO   On: 08/07/2019 20:02   CT Code Stroke CTA Neck W/WO contrast  Result Date: 08/24/2019 CLINICAL DATA:  Stroke code EXAM: CT ANGIOGRAPHY HEAD  AND NECK CT PERFUSION BRAIN TECHNIQUE: Multidetector CT imaging of the head and neck was performed using the standard protocol during bolus administration of intravenous contrast. Multiplanar CT image reconstructions and MIPs were obtained to evaluate the vascular anatomy. Carotid stenosis measurements (when applicable) are obtained utilizing NASCET criteria, using the distal internal carotid diameter as the denominator. Multiphase CT imaging of the brain was performed following IV bolus contrast injection. Subsequent parametric perfusion maps were calculated using RAPID software. CONTRAST:  124mL OMNIPAQUE IOHEXOL 350 MG/ML SOLN COMPARISON:  Noncontrast noncontrast head CT performed earlier the same day, CT angiogram head/neck 09/28/2018 FINDINGS: CTA NECK FINDINGS Aortic arch: Standard aortic branching. Soft and calcified plaque within the visualized aortic arch and proximal major branch vessels of the neck. Right carotid system: The common and internal carotid arteries are patent without significant stenosis. A stent traversing the distal common carotid artery and intra stent stenosis. Left carotid system: The common internal carotid arteries are patent without significant stenosis (50% or greater), moderate calcified plaque at the carotid bifurcation and within the proximal ICA. Vertebral arteries: The vertebral arteries are codominant. Redemonstrated calcified plaque within the V1 right vertebral artery, just distal to the still origin, with resultant moderate/severe segmental stenosis. Redemonstrated calcified plaque at the origin of the left vertebral artery with suspected moderate ostial stenosis. More distally, the vertebral arteries are patent within the neck Skeleton: No acute bony abnormality. Other neck: No neck mass or cervical lymphadenopathy. Upper chest: Interstitial thickening and ground-glass opacity within the partially imaged lung apices, nonspecific, but likely reflecting edema. Review of the  MIP images confirms the above findings CTA HEAD FINDINGS Anterior circulation: The intracranial internal carotid arteries are patent bilaterally with scattered calcified plaque and no more than mild stenosis. The M1 right middle cerebral artery is patent without significant stenosis. No M2 proximal right MCA branch occlusion is identified. The right anterior cerebral artery is patent without significant proximal stenosis. The M1 left middle cerebral artery is patent without significant stenosis. There is abrupt occlusion of an M2 proximal left MCA branch shortly beyond its origin open (series 10, image 19) (series 12, image 27). The left anterior cerebral artery is patent without significant proximal stenosis. No intracranial aneurysm is identified. Posterior circulation: The intracranial vertebral arteries are patent without significant stenosis, as is the basilar artery. The bilateral posterior cerebral arteries are patent. Foci of mild-to-moderate stenosis within the P2 right posterior cerebral artery. Venous sinuses: Within limitations of contrast timing, no convincing thrombus. Anatomic variants: Posterior communicating arteries are poorly delineated and may be hypoplastic or absent bilaterally. Review of the MIP images confirms the above findings CT Brain Perfusion Findings: CBF (<30%) Volume: 78mL left MCA vascular territory. Perfusion (Tmax>6.0s) volume: 21 mL within the left MCA vascular territory. Mismatch Volume: 7 mL Infarction Location:Left MCA vascular territory. These results were called by telephone at the time of interpretation on 08/19/2019 at 7:44 pm to provider [facility] , who verbally acknowledged these results. IMPRESSION: CTA neck: 1. Bilateral common and internal carotid arteries patent within the neck without significant stenosis (50% or greater). A stent spanning the distal common carotid artery and proximal cervical right ICA remains patent. Moderate calcified plaque at  the left carotid  bifurcation and within the proximal left ICA. 2. Calcified plaque within the V1 right vertebral artery with moderate/severe stenosis. Calcified plaque at the origin of the left vertebral artery with suspected moderate ostial stenosis. Findings are unchanged. CTA head: 1. Abrupt occlusion of a proximal M2 left MCA branch vessel shortly beyond its origin. 2. Sites of mild/moderate stenosis within the P2 right posterior cerebral artery. 3. Mild atherosclerotic disease of the intracranial internal carotid arteries. CT perfusion head: The perfusion software identifies a 14 mL core infarct within the left MCA vascular territory. In this same region, 21 mL of critically hypoperfused parenchyma is identified. Reported mismatch volume 7 mL. Reported mismatch ratio 1.5. Electronically Signed   By: Kellie Simmering DO   On: 08/07/2019 20:02   CT Code Stroke Cerebral Perfusion with contrast  Result Date: 08/01/2019 CLINICAL DATA:  Stroke code EXAM: CT ANGIOGRAPHY HEAD AND NECK CT PERFUSION BRAIN TECHNIQUE: Multidetector CT imaging of the head and neck was performed using the standard protocol during bolus administration of intravenous contrast. Multiplanar CT image reconstructions and MIPs were obtained to evaluate the vascular anatomy. Carotid stenosis measurements (when applicable) are obtained utilizing NASCET criteria, using the distal internal carotid diameter as the denominator. Multiphase CT imaging of the brain was performed following IV bolus contrast injection. Subsequent parametric perfusion maps were calculated using RAPID software. CONTRAST:  122mL OMNIPAQUE IOHEXOL 350 MG/ML SOLN COMPARISON:  Noncontrast noncontrast head CT performed earlier the same day, CT angiogram head/neck 09/28/2018 FINDINGS: CTA NECK FINDINGS Aortic arch: Standard aortic branching. Soft and calcified plaque within the visualized aortic arch and proximal major branch vessels of the neck. Right carotid system: The common and internal carotid  arteries are patent without significant stenosis. A stent traversing the distal common carotid artery and intra stent stenosis. Left carotid system: The common internal carotid arteries are patent without significant stenosis (50% or greater), moderate calcified plaque at the carotid bifurcation and within the proximal ICA. Vertebral arteries: The vertebral arteries are codominant. Redemonstrated calcified plaque within the V1 right vertebral artery, just distal to the still origin, with resultant moderate/severe segmental stenosis. Redemonstrated calcified plaque at the origin of the left vertebral artery with suspected moderate ostial stenosis. More distally, the vertebral arteries are patent within the neck Skeleton: No acute bony abnormality. Other neck: No neck mass or cervical lymphadenopathy. Upper chest: Interstitial thickening and ground-glass opacity within the partially imaged lung apices, nonspecific, but likely reflecting edema. Review of the MIP images confirms the above findings CTA HEAD FINDINGS Anterior circulation: The intracranial internal carotid arteries are patent bilaterally with scattered calcified plaque and no more than mild stenosis. The M1 right middle cerebral artery is patent without significant stenosis. No M2 proximal right MCA branch occlusion is identified. The right anterior cerebral artery is patent without significant proximal stenosis. The M1 left middle cerebral artery is patent without significant stenosis. There is abrupt occlusion of an M2 proximal left MCA branch shortly beyond its origin open (series 10, image 19) (series 12, image 27). The left anterior cerebral artery is patent without significant proximal stenosis. No intracranial aneurysm is identified. Posterior circulation: The intracranial vertebral arteries are patent without significant stenosis, as is the basilar artery. The bilateral posterior cerebral arteries are patent. Foci of mild-to-moderate stenosis  within the P2 right posterior cerebral artery. Venous sinuses: Within limitations of contrast timing, no convincing thrombus. Anatomic variants: Posterior communicating arteries are poorly delineated and may be hypoplastic or absent bilaterally. Review of the  MIP images confirms the above findings CT Brain Perfusion Findings: CBF (<30%) Volume: 79mL left MCA vascular territory. Perfusion (Tmax>6.0s) volume: 21 mL within the left MCA vascular territory. Mismatch Volume: 7 mL Infarction Location:Left MCA vascular territory. These results were called by telephone at the time of interpretation on 07/28/2019 at 7:44 pm to provider [facility] , who verbally acknowledged these results. IMPRESSION: CTA neck: 1. Bilateral common and internal carotid arteries patent within the neck without significant stenosis (50% or greater). A stent spanning the distal common carotid artery and proximal cervical right ICA remains patent. Moderate calcified plaque at the left carotid bifurcation and within the proximal left ICA. 2. Calcified plaque within the V1 right vertebral artery with moderate/severe stenosis. Calcified plaque at the origin of the left vertebral artery with suspected moderate ostial stenosis. Findings are unchanged. CTA head: 1. Abrupt occlusion of a proximal M2 left MCA branch vessel shortly beyond its origin. 2. Sites of mild/moderate stenosis within the P2 right posterior cerebral artery. 3. Mild atherosclerotic disease of the intracranial internal carotid arteries. CT perfusion head: The perfusion software identifies a 14 mL core infarct within the left MCA vascular territory. In this same region, 21 mL of critically hypoperfused parenchyma is identified. Reported mismatch volume 7 mL. Reported mismatch ratio 1.5. Electronically Signed   By: Kellie Simmering DO   On: 08/12/2019 20:02   CT HEAD CODE STROKE WO CONTRAST  Result Date: 08/15/2019 CLINICAL DATA:  Code stroke. Focal neuro deficit, greater than 6 hours,  stroke suspected. Left gaze, slurred speech, last known normal 4 p.m. EXAM: CT HEAD WITHOUT CONTRAST TECHNIQUE: Contiguous axial images were obtained from the base of the skull through the vertex without intravenous contrast. COMPARISON:  Head CT 05/29/2019 FINDINGS: Brain: No evidence of acute intracranial hemorrhage or acute demarcated cortical infarction. Redemonstrated remote cortically based infarcts within the right frontal lobe. Background of advanced chronic small vessel ischemic disease. Redemonstrated chronic lacunar infarcts within the cerebral white matter as well as bilateral basal ganglia and cerebellum. No evidence of intracranial mass. No midline shift or extra-axial fluid collection. Mild generalized parenchymal atrophy. Vascular: No definite hyperdense vessel. Atherosclerotic calcifications. Right ICA stent within the partially imaged neck. Skull: Normal. Negative for fracture or focal lesion. Sinuses/Orbits: Visualized orbits demonstrate no acute abnormality. No significant paranasal sinus disease or mastoid effusion at the imaged levels. These results were communicated to Dr. Rory Percy At 7:19 pmon 12/08/2020by text page via the [facility] messaging system. IMPRESSION: No evidence of acute intracranial hemorrhage or acute demarcated cortical infarction. Redemonstrated chronic cortically based infarcts within the right frontal lobe. Background of advanced chronic small vessel ischemic disease. Redemonstrated chronic lacunar infarcts within the cerebral white matter as well as bilateral basal ganglia and cerebellum. Electronically Signed   By: Kellie Simmering DO   On: 08/08/2019 19:21    EKG: Independently reviewed.  Sinus rhythm, shortened PR interval, LVH, QTC 524.  Assessment/Plan Principal Problem:   Acute CVA (cerebrovascular accident) ([place]) Active Problems:   Essential hypertension, benign   CKD (chronic kidney disease), stage III   Hypokalemia   QT prolongation   Acute left MCA  stroke Patient presented for evaluation of sudden onset right-sided weakness, aphasia, and leftward gaze.  Last known normal at 4 PM.  CT head negative for acute intracranial hemorrhage or infarct. CTA head showing abrupt occlusion of a proximal M2 left MCA branch vessel shortly beyond its origin. Sites of mild/moderate stenosis within the P2 right posterior cerebral artery. CTA neck showing  bilateral common and internal carotid arteries patent within the neck without significant stenosis (50% or greater). A stent spanning the distal common carotid artery and proximal cervical right ICA remains patent. Moderate calcified plaque at the left carotid bifurcation and within the proximal left ICA. Calcified plaque within the V1 right vertebral artery with moderate/severe stenosis. Calcified plaque at the origin of the left vertebral artery with suspected moderate ostial stenosis. Findings are unchanged. CTA perfusion study showing a 14 mL core infarct within the left MCA vascular territory. In this same region, 21 mL of critically hypoperfused parenchyma is identified. Reported mismatch volume 7 mL. Reported mismatch ratio 1.5. Neurology had a conversation with the patient's son and decision was made not to give TPA due to chronic thrombocytopenia and prior history of ICH versus hemorrhagic transformation of ischemic stroke.  Not a candidate for IR due to poor modified Rankin at baseline.  Admitted for stroke work-up. -Telemetry monitoring -MRI of the brain without contrast -2D echocardiogram -Hemoglobin A1c, fasting lipid panel -Aspirin 325 p.o. now and daily -Start high intensity statin when patient passes swallow eval -Frequent neurochecks -PT, OT, speech therapy. -N.p.o. until cleared by bedside swallow evaluation or formal speech evaluation -Allow permissive hypertension up to 220/120  Mild hypokalemia Potassium 3.4. -Replete potassium.  Check magnesium level and replete if low.  Continue to  monitor electrolytes.  QT prolongation on EKG -Cardiac monitoring -Keep potassium above 4 and magnesium above 2 -Repeat EKG in a.m. -Avoid QT prolonging drugs if possible  CKD stage III -Stable.  Creatinine 1.3, baseline 1.0-1.2.  Hypertension -Allow permissive hypertension up to 220/120 given acute CVA -Hydralazine PRN SBP >220 or DBP >120  Pharmacy med rec pending.  DVT prophylaxis: Subcutaneous heparin Code Status: DNR/ DNI per documentation from prior hospitalization in October 2020.  Please confirm with family in the morning. Family Communication: No family available at this time. Disposition Plan: Anticipate discharge after clinical improvement. Consults called: Neurology Admission status: It is my clinical opinion that admission to INPATIENT is reasonable and necessary in this 85 y.o. female . presenting with right-sided weakness, aphasia, and leftward gaze secondary to acute stroke.  Needs stroke work-up.  Given the aforementioned, the predictability of an adverse outcome is felt to be significant. I expect that the patient will require at least 2 midnights in the hospital to treat this condition.   The medical decision making on this patient was of high complexity and the patient is at high risk for clinical deterioration, therefore this is a level 3 visit.  Shela Leff MD [facility] Pager [phone]  If 7PM-7AM, please contact night-coverage [email or web address] Password [facility]  07/30/2019, 11:47 PM

## 2019-08-09 LAB — BASIC METABOLIC PANEL
Anion gap: 12 (ref 5–15)
BUN: 13 mg/dL (ref 8–23)
CO2: 22 mmol/L (ref 22–32)
Calcium: 9.2 mg/dL (ref 8.9–10.3)
Chloride: 107 mmol/L (ref 98–111)
Creatinine, Ser: 1.22 mg/dL — ABNORMAL HIGH (ref 0.44–1.00)
GFR calc Af Amer: 46 mL/min — ABNORMAL LOW (ref >60)
GFR calc non Af Amer: 40 mL/min — ABNORMAL LOW (ref >60)
Glucose, Bld: 130 mg/dL — ABNORMAL HIGH (ref 70–99)
Potassium: 3.6 mmol/L (ref 3.5–5.1)
Sodium: 141 mmol/L (ref 135–145)

## 2019-08-09 LAB — MRSA PCR SCREENING: MRSA by PCR: NEGATIVE

## 2019-08-09 LAB — GLUCOSE, CAPILLARY: Glucose-Capillary: 225 mg/dL — ABNORMAL HIGH (ref 70–99)

## 2019-08-09 LAB — LIPID PANEL
Cholesterol: 137 mg/dL (ref 0–200)
HDL: 48 mg/dL (ref >40)
LDL Cholesterol: 76 mg/dL (ref 0–99)
Total CHOL/HDL Ratio: 2.9 RATIO
Triglycerides: 65 mg/dL (ref <150)
VLDL: 13 mg/dL (ref 0–40)

## 2019-08-09 LAB — MAGNESIUM: Magnesium: 1.7 mg/dL (ref 1.7–2.4)

## 2019-08-09 LAB — SARS CORONAVIRUS 2 (TAT 6-24 HRS): SARS Coronavirus 2: NEGATIVE

## 2019-08-09 LAB — HEMOGLOBIN A1C
Hgb A1c MFr Bld: 4.8 % (ref 4.8–5.6)
Mean Plasma Glucose: 91.06 mg/dL

## 2019-08-09 LAB — ETHANOL: Alcohol, Ethyl (B): <10 mg/dL (ref <10)

## 2019-08-09 MED ORDER — POTASSIUM CHLORIDE 10 MEQ/100ML IV SOLN
10.0000 meq | INTRAVENOUS | Status: AC
  Administered 2019-08-09 (×2): 10 meq via INTRAVENOUS
  Filled 2019-08-09 (×2): qty 100

## 2019-08-09 MED ORDER — HYDRALAZINE HCL 20 MG/ML IJ SOLN: 10.0000 mg | INTRAMUSCULAR | Status: DC | PRN

## 2019-08-16 ENCOUNTER — Ambulatory Visit: Payer: Medicare Other | Admitting: Adult Health

## 2019-08-25 NOTE — Progress Notes (Signed)
Daughter [name] called back after 0700 and she was updated on patient' death.

## 2019-08-25 NOTE — Progress Notes (Signed)
Noted code blue sound as I was on break. Came out and noted multiple RNs in the room attending to stabilize patient. Code blue call off due to patient's DNR status. Patient started gasping for breath even with the non-ebreathers mask, continued to de sat and noted her actively dying. Called all the family members listed on the contact list and left voice messages but I have not received  any call back. Will continue to call.

## 2019-08-25 NOTE — ED Notes (Signed)
Spoke to Dr.Rathore, prn bp meds to be placed for systolic pressures [id number]

## 2019-08-25 NOTE — Progress Notes (Signed)
This RN went into patient room and noted patient was desaturating with oxygen saturation  85% on room air. Respiration. 26 Hr 76 .  .went out to get oxygen  And suction set upnoted pt having labored respiration  O2sat remained low at 84% [place] with  2 l/ min oxygen applied and O2Sat  Up 91 then drop again as pt stated foaming from the mouth.  ,suctioned and reposition on high fowlers position as  Saturation began to drop again to 81-76 %  Code was called and non re.breather masked applied with 15 l/min.  Soon found out that pt was DN R and code cancelled.

## 2019-08-25 NOTE — Death Summary Note (Signed)
Death Summary  Stephanie Harris [id number] DOB: 1931-08-02 DOA: 08-19-2019  PCP: [name], MD  Admit date: 08-19-19 Date of Death: 08-20-2019  Final Diagnoses:  Principal Problem:   Acute CVA (cerebrovascular accident) [facility]) Active Problems:   Essential hypertension, benign   CKD (chronic kidney disease), stage III   Hypokalemia   QT prolongation  History of present illness: Stephanie Harris is a 86 y.o. female with medical history significant of right MCA stroke, right ICA stent, CML, anemia, thrombocytopenia, asthma, CKD, CAD, hypertension, prior history of ICH, hypothyroidism, peripheral vascular disease, rheumatoid arthritis presenting to the ED for evaluation of sudden onset right-sided weakness, aphasia, and leftward gaze.  Last known normal at 4 PM.  She was seen by neurology.  Neurology had a discussion with the patient's son and decision was made not to give TPA due to chronic thrombocytopenia and prior history of ICH versus hemorrhagic transformation of ischemic stroke.  Not a candidate for IR due to poor modified Rankin at baseline.  Patient is nonverbal and no history could be obtained from her.    Hospital Course:  Patient presented for evaluation of sudden onset right-sided weakness, aphasia, and leftward gaze.  Last known normal at 4 PM on 08/19/2023.  CT head was negative for acute intracranial hemorrhage or infarct. CTA head showed abrupt occlusion of a proximal M2 left MCA branch vessel shortly beyond its origin. Sites of mild/moderate stenosis within the P2 right posterior cerebral artery. CTA neck showed bilateral common and internal carotid arteries patent within the neck without significant stenosis (50% or greater). A stent spanning the distal common carotid artery and proximal cervical right ICA remained patent. Moderate calcified plaque at the left carotid bifurcation and within the proximal left ICA. Calcified plaque within the V1 right vertebral artery  with moderate/severe stenosis. Calcified plaque at the origin of the left vertebral artery with suspected moderate ostial stenosis. Findings were unchanged. CTA perfusion study showed a 14 mL core infarct within the left MCA vascular territory. In this same region, 21 mL of critically hypoperfused parenchyma was identified. Reported mismatch volume 7 mL. Reported mismatch ratio 1.5. Neurology had a conversation with the patient's son and decision was made not to give TPA due to chronic thrombocytopenia and prior history of ICH versus hemorrhagic transformation of ischemic stroke.  She was deemed not to be a candidate for IR by neurology due to poor modified Rankin at baseline.  Patient was admitted for stroke work-up. She was kept NPO and aspiration precautions taken. At the time of admission, patient was not in any respiratory distress. It is documented that a few hours later nursing staff noticed that patient became acutely hypoxic and had labored respirations. She was foaming from the mouth. Nursing staff attempted to suction oral secretions and assist with an oxygen mask. Patient had a very rapid decline and was found pulseless and without respirations within a matter of minutes. She was DNR/DNI. Suspect cause of death is respiratory arrest secondary to aspiration in the setting of acute stroke.   Time of death: 0343   Signed:  Shela Leff  [facility] 08-20-19, 7:09 PM

## 2019-08-25 NOTE — Progress Notes (Signed)
Stephanie Najjar NP notified and she and the other NP at the bedside . Patient pronounced death at 03;43 by Stephanie Najjar NP

## 2019-08-25 NOTE — Significant Event (Signed)
RN paged that "patient was dying". NP to bedside. Upon arrival, pt without pulse or respirations. Pronounced death at 17.  Per RN, pt's tele alarm rang out and staff responded. Upon response, pt had bradycardia continuing to asystole. Respirations were assisted with O2 mask until ceased. Pt was a DNR.  NP attempted to call all 3 children listed on chart without success.  Death certificate completed and given to RN.  KJKG, NP [facility]

## 2019-08-25 DEATH — deceased

## 2020-07-24 ENCOUNTER — Ambulatory Visit: Payer: Medicare Other | Admitting: Adult Health
